# Patient Record
Sex: Male | Born: 1950 | Race: White | Hispanic: No
Health system: Midwestern US, Community
[De-identification: ages and names within clinical notes are randomized; demographics above are authoritative.]

## PROBLEM LIST (undated history)

## (undated) DIAGNOSIS — M5440 Lumbago with sciatica, unspecified side: Secondary | ICD-10-CM

## (undated) DIAGNOSIS — Z8673 Personal history of transient ischemic attack (TIA), and cerebral infarction without residual deficits: Secondary | ICD-10-CM

## (undated) DIAGNOSIS — N3001 Acute cystitis with hematuria: Secondary | ICD-10-CM

## (undated) DIAGNOSIS — K5903 Drug induced constipation: Secondary | ICD-10-CM

## (undated) DIAGNOSIS — I509 Heart failure, unspecified: Secondary | ICD-10-CM

## (undated) DIAGNOSIS — F329 Major depressive disorder, single episode, unspecified: Secondary | ICD-10-CM

## (undated) DIAGNOSIS — M51369 Other intervertebral disc degeneration, lumbar region without mention of lumbar back pain or lower extremity pain: Secondary | ICD-10-CM

## (undated) DIAGNOSIS — G20 Parkinson's disease: Secondary | ICD-10-CM

## (undated) DIAGNOSIS — K859 Acute pancreatitis without necrosis or infection, unspecified: Secondary | ICD-10-CM

## (undated) DIAGNOSIS — I5032 Chronic diastolic (congestive) heart failure: Secondary | ICD-10-CM

## (undated) DIAGNOSIS — F1020 Alcohol dependence, uncomplicated: Secondary | ICD-10-CM

## (undated) DIAGNOSIS — K219 Gastro-esophageal reflux disease without esophagitis: Secondary | ICD-10-CM

## (undated) DIAGNOSIS — R32 Unspecified urinary incontinence: Secondary | ICD-10-CM

## (undated) DIAGNOSIS — I10 Essential (primary) hypertension: Secondary | ICD-10-CM

## (undated) DIAGNOSIS — I639 Cerebral infarction, unspecified: Secondary | ICD-10-CM

## (undated) DIAGNOSIS — F32A Depression, unspecified: Secondary | ICD-10-CM

## (undated) DIAGNOSIS — M5136 Other intervertebral disc degeneration, lumbar region: Secondary | ICD-10-CM

## (undated) DIAGNOSIS — D4989 Neoplasm of unspecified behavior of other specified sites: Secondary | ICD-10-CM

## (undated) DIAGNOSIS — J209 Acute bronchitis, unspecified: Principal | ICD-10-CM

## (undated) DIAGNOSIS — R079 Chest pain, unspecified: Secondary | ICD-10-CM

## (undated) HISTORY — DX: Major depressive disorder, single episode, unspecified: F32.9

## (undated) HISTORY — PX: KNEE ARTHROSCOPY: SUR90

## (undated) HISTORY — DX: Acute pancreatitis without necrosis or infection, unspecified: K85.90

## (undated) HISTORY — DX: Drug induced constipation: K59.03

## (undated) HISTORY — DX: Personal history of transient ischemic attack (TIA), and cerebral infarction without residual deficits: Z86.73

## (undated) HISTORY — DX: Other intervertebral disc degeneration, lumbar region without mention of lumbar back pain or lower extremity pain: M51.369

## (undated) HISTORY — DX: Alcohol dependence, uncomplicated: F10.20

## (undated) HISTORY — PX: OTHER SURGICAL HISTORY: SHX169

## (undated) HISTORY — PX: ESOPHAGOGASTRODUODENOSCOPY: SHX1529

## (undated) HISTORY — DX: Essential (primary) hypertension: I10

## (undated) HISTORY — DX: Chronic diastolic (congestive) heart failure: I50.32

## (undated) HISTORY — PX: CHOLECYSTECTOMY: SHX55

## (undated) HISTORY — DX: Depression, unspecified: F32.A

## (undated) HISTORY — DX: Acute cystitis with hematuria: N30.01

## (undated) HISTORY — DX: Lumbago with sciatica, unspecified side: M54.40

## (undated) HISTORY — DX: Other intervertebral disc degeneration, lumbar region: M51.36

## (undated) HISTORY — PX: COLONOSCOPY: SHX174

## (undated) HISTORY — DX: Unspecified urinary incontinence: R32

## (undated) HISTORY — DX: Gastro-esophageal reflux disease without esophagitis: K21.9

## (undated) HISTORY — PX: APPENDECTOMY: SHX54

## (undated) HISTORY — PX: BACK SURGERY: SHX140

## (undated) MED ORDER — MELOXICAM 15 MG TAB
15 mg | ORAL_TABLET | Freq: Every day | ORAL | Status: DC
Start: ? — End: 2014-01-22

## (undated) MED ORDER — PREDNISONE 20 MG TAB
20 mg | ORAL_TABLET | Freq: Every day | ORAL | Status: AC
Start: ? — End: 2012-05-25

## (undated) MED ORDER — DIAZEPAM 5 MG TAB
5 mg | ORAL_TABLET | Freq: Three times a day (TID) | ORAL | Status: DC | PRN
Start: ? — End: 2014-01-22

## (undated) MED ORDER — OXYCODONE-ACETAMINOPHEN 5 MG-325 MG TAB
5-325 mg | ORAL_TABLET | ORAL | Status: DC | PRN
Start: ? — End: 2014-01-22

## (undated) MED ORDER — OXYCODONE-ACETAMINOPHEN 5 MG-325 MG TAB
5-325 mg | ORAL_TABLET | ORAL | Status: DC | PRN
Start: ? — End: 2013-09-11

## (undated) MED ORDER — PREDNISONE 20 MG TAB
20 mg | ORAL_TABLET | Freq: Three times a day (TID) | ORAL | Status: AC
Start: ? — End: 2013-09-18

---

## 2010-10-15 MED ORDER — OXYCODONE-ACETAMINOPHEN 5 MG-325 MG TAB
5-325 mg | ORAL_TABLET | ORAL | Status: DC | PRN
Start: 2010-10-15 — End: 2011-01-15

## 2010-10-15 MED ORDER — ONDANSETRON 4 MG TAB, RAPID DISSOLVE
4 mg | ORAL | Status: AC
Start: 2010-10-15 — End: 2010-10-15
  Administered 2010-10-15: 18:00:00 via ORAL

## 2010-10-15 MED ORDER — HYDROMORPHONE (PF) 1 MG/ML IJ SOLN
1 mg/mL | INTRAMUSCULAR | Status: AC
Start: 2010-10-15 — End: 2010-10-15
  Administered 2010-10-15: 18:00:00 via INTRAMUSCULAR

## 2010-10-15 MED ORDER — DIAZEPAM 5 MG TAB
5 mg | ORAL | Status: AC
Start: 2010-10-15 — End: 2010-10-15
  Administered 2010-10-15: 18:00:00 via ORAL

## 2010-10-15 MED ORDER — DIAZEPAM 5 MG TAB
5 mg | ORAL_TABLET | Freq: Three times a day (TID) | ORAL | Status: DC | PRN
Start: 2010-10-15 — End: 2011-01-15

## 2010-10-15 NOTE — ED Provider Notes (Signed)
I was personally available for consultation in the emergency department.  I have reviewed the chart and agree with the documentation recorded by the MLP, including the assessment, treatment plan, and disposition.  Otniel Hoe E Reeva Davern, MD

## 2010-10-15 NOTE — ED Provider Notes (Signed)
HPI Comments: Dale Weaver is a 60 y.o. male presenting ambulatory to ED C/O low back pain S/P falling out of a lawn chair x 2 days ago.  Pt denies LOC, head injury, numbness, tingling, and incontinence of bladder or bowel.    PCP:  None     There are no other complaints, changes or physical findings at this time.   Written by Joretta Bachelor. Croak, ED Scribe, as dictated by Gillermina Phy.     The history is provided by the patient.        Past Medical History   Diagnosis Date   ??? Back pain    ??? Hiatal hernia    ??? Heart failure      CHF   ??? CAD (coronary artery disease)      cardiac blockage        Past Surgical History   Procedure Date   ??? Hx orthopaedic      back surgery x3, last June 2010   ??? Cardiac surg procedure unlist      cardiac cath August 2009   ??? Cardiac surg procedure unlist      x 1 stent, cardiologist @ [facility]   ??? Hx appendectomy    ??? Hx cholecystectomy          No family history on file.     History     Social History   ??? Marital Status: Divorced     Spouse Name: N/A     Number of Children: N/A   ??? Years of Education: N/A     Occupational History   ??? Not on file.     Social History Main Topics   ??? Smoking status: Never Smoker    ??? Smokeless tobacco: Not on file   ??? Alcohol Use: No   ??? Drug Use: No   ??? Sexually Active:      Other Topics Concern   ??? Not on file     Social History Narrative   ??? No narrative on file                  ALLERGIES: Sulfa (sulfonamide antibiotics)      Review of Systems   Constitutional: Negative.    HENT: Negative.    Eyes: Negative.    Respiratory: Negative.    Cardiovascular: Negative.    Gastrointestinal: Negative.         No stool incontinence   Genitourinary: Negative.         No urinary incontinence   Musculoskeletal: Positive for back pain.   Skin: Negative.    Neurological: Negative.  Negative for numbness.        No LOC or head injury   All other systems reviewed and are negative.        Filed Vitals:    10/15/10 1217   BP: 129/64   Pulse: 79    Temp: 97.7 ??F (36.5 ??C)   Resp: 18   SpO2: 97%            Physical Exam   Nursing note and vitals reviewed.  Constitutional: He is oriented to person, place, and time. He appears well-developed and well-nourished. No distress.   HENT:   Head: Normocephalic and atraumatic.   Right Ear: Tympanic membrane and external ear normal.   Left Ear: Tympanic membrane and external ear normal.   Nose: Nose normal.   Mouth/Throat: Oropharynx is clear and moist and mucous membranes are normal. No oropharyngeal  exudate.   Eyes: Conjunctivae and EOM are normal. Pupils are equal, round, and reactive to light. Right eye exhibits no discharge. Left eye exhibits no discharge. No scleral icterus.   Neck: Normal range of motion. Neck supple. No JVD present. No tracheal deviation present. No thyromegaly present.   Cardiovascular: Normal rate, regular rhythm, normal heart sounds and intact distal pulses.  Exam reveals no gallop and no friction rub.    No murmur heard.  Pulmonary/Chest: Effort normal and breath sounds normal. No stridor. No respiratory distress. He has no wheezes. He has no rales. He exhibits no tenderness.   Abdominal: Soft. Bowel sounds are normal. He exhibits no distension and no mass. No tenderness. He has no rebound and no guarding.   Musculoskeletal: He exhibits no edema and no tenderness.        +R SLR   Lymphadenopathy:     He has no cervical adenopathy.   Neurological: He is alert and oriented to person, place, and time. He has normal strength and normal reflexes. No cranial nerve deficit or sensory deficit. He exhibits normal muscle tone. Coordination and gait normal.   Skin: Skin is warm and dry. No rash noted. He is not diaphoretic. No erythema. No pallor.        Multiple surgical scars and post-op changes to low back   Psychiatric: He has a normal mood and affect.   Written by Joretta Bachelor. Croak, ED Scribe, as dictated by Gillermina Phy.      MDM    Procedures    12:42 PM  PROGRESS NOTE:    Pt has been re-examined and is feeling better.  Results of imaging have been reviewed and discussed with pt.  Care plan has been outlined and pt understands all current sx, dx, tx, and rx.  There are no new complaints, changes, or physical findings at this time.  All questions have been addressed.  All medications were reviewed with the pt.  Pt was instructed to and agrees to follow up with PCP, as well as return to ED upon further deterioration.  Dale Weaver is ready for discharge.  Written by Joretta Bachelor. Croak, ED Scribe, as dictated by Gillermina Phy.

## 2010-10-15 NOTE — ED Notes (Signed)
Patient discharged by PA.  Patient accompanied home by family members.

## 2011-01-15 MED ORDER — HYDROCODONE-ACETAMINOPHEN 5 MG-500 MG TAB
5-500 mg | ORAL_TABLET | ORAL | Status: AC | PRN
Start: 2011-01-15 — End: 2011-01-22

## 2011-01-15 MED ORDER — CEPHALEXIN 500 MG CAP
500 mg | ORAL_CAPSULE | Freq: Four times a day (QID) | ORAL | Status: AC
Start: 2011-01-15 — End: 2011-01-22

## 2011-01-15 NOTE — ED Provider Notes (Signed)
I have personally seen and evaluated patient. I find the patient's history and physical exam are consistent with the PA's NP documentation. I agree with the care provided, treatments rendered, disposition and follow up plan.

## 2011-01-15 NOTE — ED Notes (Signed)
Patient (s)  given copy of dc instructions and 2 script(s).  Patient (s)  verbalized understanding of instructions and script (s).  Patient given a current medication reconciliation form and verbalized understanding of their medications.   Patient (s) verbalized understanding of the importance of discussing medications with  his or her physician or clinic they will be following up with.  Patient alert and oriented and in no acute distress.  Patient discharged home ambulatory with self.

## 2011-01-15 NOTE — ED Provider Notes (Signed)
HPI Comments: Patient is day #10 s/p laparoscopic hiatal hernia repair at the [facility] hospital.   He presents through ED reporting mild upper abdominal pain, and pain at incision site. He explains that he thinks one of the incision sites opened up, but has since scabbed over.   No report of fevers.   Patient was Rx 3 days worth of Vicodin, which he just recently finished.       The history is provided by the patient.        Past Medical History   Diagnosis Date   ??? Back pain    ??? Hiatal hernia    ??? Heart failure      CHF   ??? CAD (coronary artery disease)      cardiac blockage        Past Surgical History   Procedure Date   ??? Hx orthopaedic      back surgery x3, last June 2010   ??? Cardiac surg procedure unlist      cardiac cath August 2009   ??? Cardiac surg procedure unlist      x 1 stent, cardiologist @ [facility]   ??? Hx appendectomy    ??? Hx cholecystectomy    ??? Hx hernia repair          No family history on file.     History     Social History   ??? Marital Status: Divorced     Spouse Name: N/A     Number of Children: N/A   ??? Years of Education: N/A     Occupational History   ??? Not on file.     Social History Main Topics   ??? Smoking status: Never Smoker    ??? Smokeless tobacco: Not on file   ??? Alcohol Use: No   ??? Drug Use: No   ??? Sexually Active:      Other Topics Concern   ??? Not on file     Social History Narrative   ??? No narrative on file                  ALLERGIES: Seafood; Sulfa(sulfonamide antibiotics); and Zoloft      Review of Systems   Constitutional: Negative for appetite change and unexpected weight change.   HENT: Negative for facial swelling, rhinorrhea, trouble swallowing, neck pain, dental problem and ear discharge.    Eyes: Negative for pain and discharge.   Respiratory: Negative for apnea and stridor.    Cardiovascular: Negative for chest pain, palpitations and leg swelling.   Gastrointestinal: Positive for abdominal pain. Negative for vomiting, diarrhea, blood in stool and abdominal distention.    Genitourinary: Negative for dysuria, hematuria, flank pain and difficulty urinating.   Musculoskeletal: Negative for myalgias, back pain, joint swelling and arthralgias.   Skin: Positive for wound. Negative for color change and rash.   Neurological: Negative for facial asymmetry, speech difficulty, numbness and headaches.   Hematological: Negative for adenopathy.   Psychiatric/Behavioral: Negative for suicidal ideas, hallucinations, behavioral problems, self-injury and agitation.       Filed Vitals:    01/15/11 1502   BP: 152/56   Pulse: 67   Temp: 98.4 ??F (36.9 ??C)   Resp: 18   Height: 5\' 10"  (1.778 m)   Weight: 86.183 kg (190 lb)   SpO2: 99%            Physical Exam   [nursing notereviewed.  Constitutional: He is oriented to person, place, and time. He  appears well-developed and well-nourished.   HENT:   Head: Normocephalic and atraumatic.   Right Ear: External ear normal.   Left Ear: External ear normal.   Mouth/Throat: Oropharynx is clear and moist. No oropharyngeal exudate.   Eyes: Conjunctivae and EOM are normal. Pupils are equal, round, and reactive to light. Right eye exhibits no discharge. Left eye exhibits no discharge. No scleral icterus.   Neck: Normal range of motion. Neck supple. No tracheal deviation present. No thyromegaly present.   Cardiovascular: Normal rate, regular rhythm, normal heart sounds and intact distal pulses.    No murmur heard.  Pulmonary/Chest: Effort normal and breath sounds normal. No respiratory distress. He has no wheezes. He has no rales.   Abdominal: Soft. He exhibits no distension. There is tenderness. There is no rebound and no guarding.       Musculoskeletal: Normal range of motion. He exhibits no edema and no tenderness.   Lymphadenopathy:     He has no cervical adenopathy.   Neurological: He is alert and oriented to person, place, and time. No cranial nerve deficit. Coordination normal.   Skin: Skin is warm. No rash noted. There is erythema.    Psychiatric: He has a normal mood and affect. His behavior is normal. Judgment and thought content normal.        MDM    Procedures

## 2011-04-24 MED ORDER — DICLOFENAC 75 MG TAB, DELAYED RELEASE
75 mg | ORAL_TABLET | Freq: Two times a day (BID) | ORAL | Status: DC
Start: 2011-04-24 — End: 2012-05-15

## 2011-04-24 NOTE — ED Notes (Signed)
Patient (s)  given copy of dc instructions and 1 script(s).  Patient (s)  verbalized understanding of instructions and script (s).  Patient given a current medication reconciliation form and verbalized understanding of their medications.   Patient (s) verbalized understanding of the importance of discussing medications with  his or her physician or clinic they will be following up with.  Patient alert and oriented and in no acute distress.  Patient discharged home ambulatory with self.

## 2011-04-24 NOTE — ED Provider Notes (Signed)
Patient is a 60 y.o. male presenting with knee pain. The history is provided by the patient. No language interpreter was used.   Knee Pain   This is a new (twisted while doing yardwork) problem. The current episode started more than 1 week ago. The problem occurs daily. The problem has not changed since onset.The pain is present in the left knee. The quality of the pain is described as dull. The pain is mild. Pertinent negatives include full range of motion, no stiffness, no tingling and no back pain. The symptoms are aggravated by movement. Treatments tried: Vicodin from a previous rx. The treatment provided significant relief. There has been a history of trauma.        Past Medical History   Diagnosis Date   ??? Back pain    ??? Hiatal hernia    ??? Heart failure      CHF   ??? CAD (coronary artery disease)      cardiac blockage        Past Surgical History   Procedure Date   ??? Hx orthopaedic      back surgery x3, last June 2010   ??? Pr cardiac surg procedure unlist      cardiac cath August 2009   ??? Pr cardiac surg procedure unlist      x 1 stent, cardiologist @ [facility]   ??? Hx appendectomy    ??? Hx cholecystectomy    ??? Hx hernia repair          No family history on file.     History     Social History   ??? Marital Status: Widowed     Spouse Name: N/A     Number of Children: N/A   ??? Years of Education: N/A     Occupational History   ??? Not on file.     Social History Main Topics   ??? Smoking status: Never Smoker    ??? Smokeless tobacco: Not on file   ??? Alcohol Use: No   ??? Drug Use: No   ??? Sexually Active:      Other Topics Concern   ??? Not on file     Social History Narrative   ??? No narrative on file                  ALLERGIES: Seafood; Sulfa(sulfonamide antibiotics); and Zoloft      Review of Systems   Musculoskeletal: Positive for arthralgias (left knee). Negative for back pain and stiffness.   Neurological: Negative for tingling.   All other systems reviewed and are negative.        Filed Vitals:    04/24/11 1244   BP: 144/92    Pulse: 73   Temp: 97.5 ??F (36.4 ??C)   Resp: 18   Height: 6\' 1"  (1.854 m)   Weight: 106.595 kg (235 lb)   SpO2: 96%            Physical Exam   Nursing note and vitals reviewed.  Constitutional: He is oriented to person, place, and time. He appears well-developed and well-nourished.   HENT:   Head: Normocephalic and atraumatic.   Eyes: EOM are normal.   Neck: Normal range of motion. Neck supple.   Cardiovascular: Normal rate, regular rhythm, normal heart sounds and intact distal pulses.    Pulmonary/Chest: Effort normal and breath sounds normal. No respiratory distress.   Abdominal: Soft. Bowel sounds are normal. He exhibits no distension. There is no tenderness.  Musculoskeletal: Normal range of motion. He exhibits tenderness. He exhibits no edema.        Left knee: He exhibits normal range of motion, no swelling, no effusion, no ecchymosis, no deformity, no erythema, normal alignment, no LCL laxity, no bony tenderness, normal meniscus and no MCL laxity. tenderness found. Medial joint line and lateral joint line tenderness noted. No MCL, no LCL and no patellar tendon tenderness noted.        Legs:  Neurological: He is alert and oriented to person, place, and time.        Grossly intact without focal deficits      Skin: Skin is warm, dry and intact. No bruising and no ecchymosis noted. No erythema.   Psychiatric: He has a normal mood and affect. His behavior is normal.        MDM    Procedures    Pt. Advised of A/P and the need to follow-up with their PCP or assigned referral physician

## 2012-05-15 LAB — CBC WITH AUTOMATED DIFF
ABS. BASOPHILS: 0 10*3/uL (ref 0.0–0.1)
ABS. EOSINOPHILS: 0.1 10*3/uL (ref 0.0–0.4)
ABS. LYMPHOCYTES: 1.4 10*3/uL (ref 0.8–3.5)
ABS. MONOCYTES: 0.5 10*3/uL (ref 0.0–1.0)
ABS. NEUTROPHILS: 3.4 10*3/uL (ref 1.8–8.0)
BASOPHILS: 0 % (ref 0–1)
EOSINOPHILS: 1 % (ref 0–7)
HCT: 44.8 % (ref 36.6–50.3)
HGB: 15.8 g/dL (ref 12.1–17.0)
LYMPHOCYTES: 26 % (ref 12–49)
MCH: 34.2 PG — ABNORMAL HIGH (ref 26.0–34.0)
MCHC: 35.3 g/dL (ref 30.0–36.5)
MCV: 97 FL (ref 80.0–99.0)
MONOCYTES: 9 % (ref 5–13)
NEUTROPHILS: 64 % (ref 32–75)
PLATELET: 168 10*3/uL (ref 150–400)
RBC: 4.62 M/uL (ref 4.10–5.70)
RDW: 12.5 % (ref 11.5–14.5)
WBC: 5.2 10*3/uL (ref 4.1–11.1)

## 2012-05-15 LAB — METABOLIC PANEL, COMPREHENSIVE
A-G Ratio: 1.4 (ref 1.1–2.2)
ALT (SGPT): 19 U/L (ref 12–78)
AST (SGOT): 10 U/L — ABNORMAL LOW (ref 15–37)
Albumin: 3.8 g/dL (ref 3.5–5.0)
Alk. phosphatase: 83 U/L (ref 50–136)
Anion gap: 9 mmol/L (ref 5–15)
BUN/Creatinine ratio: 12 (ref 12–20)
BUN: 12 MG/DL (ref 6–20)
Bilirubin, total: 0.8 MG/DL (ref 0.2–1.0)
CO2: 26 MMOL/L (ref 21–32)
Calcium: 8.5 MG/DL (ref 8.5–10.1)
Chloride: 106 MMOL/L (ref 97–108)
Creatinine: 0.98 MG/DL (ref 0.45–1.15)
GFR est AA: >60 mL/min/{1.73_m2} (ref >60)
GFR est non-AA: >60 mL/min/{1.73_m2} (ref >60)
Globulin: 2.7 g/dL (ref 2.0–4.0)
Glucose: 103 MG/DL — ABNORMAL HIGH (ref 65–100)
Potassium: 4.2 MMOL/L (ref 3.5–5.1)
Protein, total: 6.5 g/dL (ref 6.4–8.2)
Sodium: 141 MMOL/L (ref 136–145)

## 2012-05-15 LAB — PROTHROMBIN TIME + INR
INR: 1 (ref 0.9–1.1)
Prothrombin time: 10.4 s (ref 9.4–11.7)

## 2012-05-15 MED ORDER — HYDROMORPHONE (PF) 1 MG/ML IJ SOLN
1 mg/mL | Freq: Once | INTRAMUSCULAR | Status: AC
Start: 2012-05-15 — End: 2012-05-15
  Administered 2012-05-15: 18:00:00 via INTRAVENOUS

## 2012-05-15 MED ORDER — SODIUM CHLORIDE 0.9 % IJ SYRG
INTRAMUSCULAR | Status: DC | PRN
Start: 2012-05-15 — End: 2012-05-15
  Administered 2012-05-15: 18:00:00 via INTRAVENOUS

## 2012-05-15 MED ADMIN — HYDROmorphone (PF) (DILAUDID) injection 2 mg: INTRAVENOUS | @ 19:00:00 | NDC 00409255201

## 2012-05-15 MED ADMIN — methylPREDNISolone (PF) (SOLU-MEDROL) injection 125 mg: INTRAVENOUS | @ 18:00:00 | NDC 00009004725

## 2012-05-15 MED ADMIN — ondansetron (ZOFRAN) injection 4 mg: INTRAVENOUS | @ 18:00:00 | NDC 00409475503

## 2012-05-15 MED ADMIN — acetaminophen (TYLENOL) tablet 1,000 mg: ORAL | @ 15:00:00 | NDC 00904198861

## 2012-05-15 MED FILL — HYDROMORPHONE (PF) 1 MG/ML IJ SOLN: 1 mg/mL | INTRAMUSCULAR | Qty: 2

## 2012-05-15 MED FILL — ONDANSETRON (PF) 4 MG/2 ML INJECTION: 4 mg/2 mL | INTRAMUSCULAR | Qty: 2

## 2012-05-15 MED FILL — ACETAMINOPHEN 500 MG TAB: 500 mg | ORAL | Qty: 2

## 2012-05-15 MED FILL — SOLU-MEDROL (PF) 125 MG/2 ML SOLUTION FOR INJECTION: 125 mg/2 mL | INTRAMUSCULAR | Qty: 2

## 2012-05-15 MED FILL — HYDROMORPHONE (PF) 1 MG/ML IJ SOLN: 1 mg/mL | INTRAMUSCULAR | Qty: 1

## 2012-05-15 NOTE — ED Notes (Signed)
Assumed patient care from triage at this time.

## 2012-05-15 NOTE — ED Provider Notes (Signed)
HPI Comments: 61 y.o. Male, with PMHx significant for chronic back pain, was transferred from [facility] to the ED with C/O back pain secondary to falling. Pt states he was climbing down a ladder when he had a "back spasm" and fell to the ground x 3 days ago. Pt denies LOC. Pt reports back pain, which worsens with movement. Pt currently rates his pain as a 9/10. Pt also reports urinary incontinence and notes he has not had a bowel movement in 2 days. Pt states he was seen at [facility] today for his sx's and had a x-ray of his back showing arthritis changes but no acute fractures. Pt notes he was given Dilaudid for his pain at [facility]. Pt was transferred to [facility] ED so the pt could be seen by Orthopedics. Pt notes he has had 3 back surgeries done at the [facility] hospital. Pt notes he has a Loop Recorder, and s unable to have an MRI performed. Pt denies F/C.     PMHx significant for: back pain, hiatal hernia, CHF, cardiac blockage  PSHx significant for: back surgery x 3, cardiac cath, cardiac stent, appendectomy, cholecystectomy, hernia repair  Social Hx: -tobacco, -EtOH, -illicit drug use    There are no other complaints, changes or physical findings at this time.    Written by Lanier Clam, ED Scribe, as dictated by Garnette Scheuermann, MD.     The history is provided by the patient.        Past Medical History   Diagnosis Date   ??? Back pain    ??? Hiatal hernia    ??? Heart failure      CHF   ??? CAD (coronary artery disease)      cardiac blockage        Past Surgical History   Procedure Date   ??? Hx orthopaedic      back surgery x3, last June 2010   ??? Pr cardiac surg procedure unlist      cardiac cath August 2009   ??? Pr cardiac surg procedure unlist      x 1 stent, [facility]   ??? Hx appendectomy    ??? Hx cholecystectomy    ??? Hx hernia repair          History reviewed. No pertinent family history.     History     Social History   ??? Marital Status: WIDOWED     Spouse  Name: N/A     Number of Children: N/A   ??? Years of Education: N/A     Occupational History   ??? Not on file.     Social History Main Topics   ??? Smoking status: Never Smoker    ??? Smokeless tobacco: Not on file   ??? Alcohol Use: No   ??? Drug Use: No   ??? Sexually Active:      Other Topics Concern   ??? Not on file     Social History Narrative   ??? No narrative on file                  ALLERGIES: Seafood; Sulfa (sulfonamide antibiotics); and Zoloft      Review of Systems   Constitutional: Negative.  Negative for fever and chills.   HENT: Negative.    Eyes: Negative.    Respiratory: Negative.    Cardiovascular: Negative.    Gastrointestinal: Positive for constipation.   Genitourinary: Positive for enuresis.   Musculoskeletal:  Positive for back pain.   Skin: Negative.    Neurological: Negative.         -No LOC   Hematological: Negative.    Psychiatric/Behavioral: Negative.    All other systems reviewed and are negative.        Filed Vitals:    05/15/12 1712 05/15/12 2004 05/15/12 2019 05/15/12 2034   BP:  138/67 128/53 145/83   Pulse:       Temp:       Resp:       SpO2: 94% 93% 93% 94%            Physical Exam   Nursing note and vitals reviewed.  Constitutional: He is oriented to person, place, and time. He appears well-developed and well-nourished. No distress.   HENT:   Head: Normocephalic and atraumatic.   Eyes: EOM are normal. Pupils are equal, round, and reactive to light.   Neck: Normal range of motion. Neck supple.   Cardiovascular: Normal rate, regular rhythm, normal heart sounds and intact distal pulses.  Exam reveals no friction rub.    No murmur heard.  Pulmonary/Chest: Effort normal and breath sounds normal. No respiratory distress. He has no wheezes. He has no rales. He exhibits no tenderness.   Abdominal: Soft. Bowel sounds are normal. He exhibits no distension. There is no tenderness. There is no rebound and no guarding.   Musculoskeletal: Normal range of motion. He exhibits tenderness. He exhibits no edema.         L spine. There is a port wine birthmark over the lower portion of the L spine and buttock cleft. SUrgical scars in the butock cleft. Positive SLR. ABle to sit, stand and ambulate without assistance.   Neurological: He is alert and oriented to person, place, and time. He exhibits normal muscle tone. Coordination normal.   Skin: Skin is warm and dry. He is not diaphoretic. No pallor.   Psychiatric: He has a normal mood and affect. His behavior is normal.        MDM     Differential Diagnosis; Clinical Impression; Plan:     DDx: spinal malignancy, spine fracture, herniated disc, cauda equina  Amount and/or Complexity of Data Reviewed:   Clinical lab tests:  Ordered and reviewed  Tests in the radiology section of CPT??:  Ordered and reviewed   Review and summarize past medical records:  Yes   Discuss the patient with another provider:  Yes (Orthopedics)   Independant visualization of image, tracing, or specimen:  Yes  Progress:   Patient progress:  Stable      Procedures    CONSULT NOTE:   8:24 PM  Garnette Scheuermann, MD spoke with PA-Straub,   Specialty: Orthopedics  Discussed pt's hx, disposition, and available diagnostic and imaging results. Reviewed care plans. Consultant agrees with plans as outlined. PA-Straub states the pt does not have cauda equina after reviewing the pt's CT scan. PA-Straub recommends prescribing the pt steroids and pain medication. PA-Straub states the pt can be discharged and advised having the pt follow up with Dr. Mammie Russian.   Written by Lanier Clam, ED Scribe, as dictated by Garnette Scheuermann, MD.    LABORATORY TESTS:  Recent Results (from the past 12 hour(s))   CBC WITH AUTOMATED DIFF    Collection Time    05/15/12  2:45 PM       Component Value Range    WBC 5.2  4.1 - 11.1 K/uL    RBC 4.62  4.10 - 5.70 M/uL    HGB 15.8  12.1 - 17.0 g/dL    HCT 16.1  09.6 - 04.5 %    MCV 97.0  80.0 - 99.0 FL    MCH 34.2 (*) 26.0 - 34.0 PG    MCHC 35.3  30.0 - 36.5 g/dL    RDW 40.9  81.1 - 91.4 %    PLATELET  168  150 - 400 K/uL    NEUTROPHILS 64  32 - 75 %    LYMPHOCYTES 26  12 - 49 %    MONOCYTES 9  5 - 13 %    EOSINOPHILS 1  0 - 7 %    BASOPHILS 0  0 - 1 %    ABS. NEUTROPHILS 3.4  1.8 - 8.0 K/UL    ABS. LYMPHOCYTES 1.4  0.8 - 3.5 K/UL    ABS. MONOCYTES 0.5  0.0 - 1.0 K/UL    ABS. EOSINOPHILS 0.1  0.0 - 0.4 K/UL    ABS. BASOPHILS 0.0  0.0 - 0.1 K/UL   METABOLIC PANEL, COMPREHENSIVE    Collection Time    05/15/12  2:45 PM       Component Value Range    Sodium 141  136 - 145 MMOL/L    Potassium 4.2  3.5 - 5.1 MMOL/L    Chloride 106  97 - 108 MMOL/L    CO2 26  21 - 32 MMOL/L    Anion gap 9  5 - 15 mmol/L    Glucose 103 (*) 65 - 100 MG/DL    BUN 12  6 - 20 MG/DL    Creatinine 7.82  9.56 - 1.15 MG/DL    BUN/Creatinine ratio 12  12 - 20      GFR est AA >60  >60 ml/min/1.70m2    GFR est non-AA >60  >60 ml/min/1.58m2    Calcium 8.5  8.5 - 10.1 MG/DL    Bilirubin, total 0.8  0.2 - 1.0 MG/DL    ALT 19  12 - 78 U/L    AST 10 (*) 15 - 37 U/L    Alk. phosphatase 83  50 - 136 U/L    Protein, total 6.5  6.4 - 8.2 g/dL    Albumin 3.8  3.5 - 5.0 g/dL    Globulin 2.7  2.0 - 4.0 g/dL    A-G Ratio 1.4  1.1 - 2.2     PROTHROMBIN TIME    Collection Time    05/15/12  2:45 PM       Component Value Range    INR 1.0  0.9 - 1.1      Prothrombin time 10.4  9.4 - 11.7 sec       IMAGING RESULTS:   CT SPINE LUMB WO CONT (Final result)   Result time:05/15/12 1919      Final result by Rad Results In Edi (05/15/12 19:19:00)      Narrative:    **Final Report**      ICD Codes / Adm.Diagnosis: 12 / Back Pain   Examination: CT L SPINE WO CON - [id number] - May 15 2012 7:01PM  Accession No: [id number]  Reason: Trauma      REPORT:  ADDITIONAL INDICATION: Trauma, low back pain.    COMPARISON: None.    TECHNIQUE: Multislice helical CT of the lumbar spine was performed. Sagittal   and coronal reformations were generated.    FINDINGS:  The alignment of the lumbar spine is unremarkable for 2 mm retrolisthesis at  L2-3 and L3-4, with multilevel spondylosis and  degenerative disc disease..   Disc space height is diminished at L5-S1. There is partial fusion at T11-12   with mild disc space narrowing as well. The vertebral body heights and disc   spaces are otherwise well-preserved.    The findings at each level are as follows:    T12-L1: No herniation or stenosis.    L1-L2: No herniation or stenosis.    L2-L3: Grade 1 retrolisthesis with moderately severe central stenosis, broad   spondylosis and bulge and moderately severe facet arthropathy. No discrete   herniation. Mild bilateral foraminal stenosis.    L3-L4: Grade 1 retrolisthesis with moderately severe central stenosis, broad   spondylosis and bulge, moderate facet arthropathy and moderate foraminal   stenosis.4 prior laminectomy is suggested.    L4-L5: Mild central stenosis, moderate facet arthropathy, significant   foraminal stenosis. There may have been prior left laminectomy.1    L5-S1: Mild central stenosis and bilateral foraminal stenosis.      IMPRESSION:  1. No acute bony abnormality of the lumbar spine.  2. Multilevel degenerative disc disease and stenosis with foraminal stenosis.  3. No discrete herniation.  4. Probable postoperative change.          Signing/Reading Doctor: Creed Copper [phone])   Approved: Creed Copper [phone]) May 15 2012 7:17PM          MEDICATIONS GIVEN:    Medications   predniSONE (DELTASONE) 20 mg tablet (not administered)   oxyCODONE-acetaminophen (PERCOCET) 5-325 mg per tablet (not administered)   HYDROmorphone (PF) (DILAUDID) injection 2 mg (2 mg SubCUTAneous Given 05/15/12 2039)       IMPRESSION:  1. Low back strain    2. Fall    3. Degenerative lumbar disc        PLAN:  1. Pt prescribed Deltasone and Percocet   2. F/U with Orthopedics   Return to ED if worse       Discharge Note:  8:30 PM  Pt has been reexamined by Garnette Scheuermann, MD. Pt has no new complaints, changes, or physical findings. Care plan outlined and precautions discussed. All available results were  reviewed with pt. All medications were reviewed with pt. All of pt???s questions and concerns were addressed. Pt agrees to F/U as instructed by Garnette Scheuermann, MD and agrees to return to ED upon further deterioration. Pt is ready to go home.  Written by Lanier Clam, ED Scribe, as dictated by Garnette Scheuermann, MD.

## 2012-05-15 NOTE — ED Notes (Signed)
Patient transported to x-ray at this time.

## 2012-05-15 NOTE — ED Notes (Signed)
NP Marylene Land at bedside updating patient on plan of care and admission. Tech obtaining IV and blood specimens. Will continue to monitor.

## 2012-05-15 NOTE — ED Notes (Signed)
Patient reports he fell off ladder standing 3 rungs up on ladder onto lower back. Patient seen at [facility] ED today for lower back pain. Patient also C/O being incontinent of urine and having no BM x 3 days since accident. [facility] advised they saw changes on x ray and sent to [facility] ED for ortho consult

## 2012-05-15 NOTE — ED Notes (Signed)
Transport at bedside. EMTALA form completed by RN Jean Rosenthal. Transport has EMTALA form and patients face sheet / ED encounter in hand. Patients care transferred to EMS at this time.

## 2012-05-15 NOTE — ED Notes (Signed)
Dr. Marinus Maw has reviewed discharge instructions with the patient.  The patient verbalized understanding.  Pt was medicated per order, pt to wait in room until ride arrives at 2115.

## 2012-05-15 NOTE — Progress Notes (Signed)
Pt says that he has a PCP at the [place], but he has also applied for [facility]. No further issues.    Selinda Michaels, MSW, [phone]

## 2012-05-15 NOTE — Other (Signed)
TRANSFER - OUT REPORT:    Verbal report given to Hilton Cork RN (name) on Dale Weaver  being transferred to [facility] ED (unit) for routine progression of care       Report consisted of patient???s Situation, Background, Assessment and   Recommendations(SBAR).     Information from the following report(s) SBAR was reviewed with the receiving nurse.    Opportunity for questions and clarification was provided.

## 2012-05-15 NOTE — ED Notes (Signed)
Patient updated on plan of care. Patient awaiting discharge. Will continue to monitor.

## 2012-05-15 NOTE — ED Provider Notes (Signed)
HPI Comments: Dale Weaver is a 61 year old male present to ER today complainig of back pain . Says back pain is chronic . Says he has been treated at the  [facility] and has had back surgeries . Pain is worsening. And he has become incontinent  Urine. He says "the last time this happened I had surgery"   He denies fever chills He denies chest pain abdominal pain. He reports numbness in his legs and says "if I pull a hair off my leg I do not feel anything. He reports parasthesia bilaterally.  PCP Dr Harrison Mons    Patient is a 61 y.o. male presenting with back pain. The history is provided by the patient. No language interpreter was used.   Back Pain   This is a chronic problem. The current episode started more than 1 week ago. The problem has been gradually worsening. The problem occurs constantly. The pain is associated with no known injury. The pain is present in the lumbar spine. The quality of the pain is described as similar to previous episodes. Radiates to: both legs. The pain is severe. The symptoms are aggravated by certain positions. Associated symptoms include bladder incontinence and paresthesias. Pertinent negatives include no chest pain, no fever, no numbness, no headaches, no abdominal pain, no dysuria and no weakness. He has tried muscle relaxants and analgesics for the symptoms. Risk factors: previous back surgery.        Past Medical History   Diagnosis Date   ??? Back pain    ??? Hiatal hernia    ??? Heart failure      CHF   ??? CAD (coronary artery disease)      cardiac blockage        Past Surgical History   Procedure Date   ??? Hx orthopaedic      back surgery x3, last June 2010   ??? Pr cardiac surg procedure unlist      cardiac cath August 2009   ??? Pr cardiac surg procedure unlist      x 1 stent, [facility] @ [facility]   ??? Hx appendectomy    ??? Hx cholecystectomy    ??? Hx hernia repair          History reviewed. No pertinent family history.     History     Social History   ??? Marital Status: WIDOWED     Spouse Name: N/A      Number of Children: N/A   ??? Years of Education: N/A     Occupational History   ??? Not on file.     Social History Main Topics   ??? Smoking status: Never Smoker    ??? Smokeless tobacco: Not on file   ??? Alcohol Use: No   ??? Drug Use: No   ??? Sexually Active:      Other Topics Concern   ??? Not on file     Social History Narrative   ??? No narrative on file                  ALLERGIES: Seafood; Sulfa (sulfonamide antibiotics); and Zoloft      Review of Systems   Constitutional: Negative for fever, chills and fatigue.   HENT: Negative for congestion, sore throat, neck pain and neck stiffness.    Eyes: Negative for redness.   Respiratory: Negative for cough, chest tightness and wheezing.    Cardiovascular: Negative for chest pain.   Gastrointestinal: Negative for abdominal pain.  Genitourinary: Positive for bladder incontinence. Negative for dysuria.   Musculoskeletal: Positive for back pain. Negative for myalgias and arthralgias.   Skin: Negative for rash.   Neurological: Positive for paresthesias. Negative for dizziness, syncope, weakness, light-headedness, numbness and headaches.   Hematological: Negative for adenopathy.   Psychiatric/Behavioral: Negative for behavioral problems and agitation.   All other systems reviewed and are negative.        Filed Vitals:    05/15/12 1441 05/15/12 1449 05/15/12 1500 05/15/12 1613   BP: 141/80  147/83 130/106   Pulse: 60   54   Temp:       Resp:    16   Height:       Weight:       SpO2: 100% 96% 96% 95%            Physical Exam   Nursing note and vitals reviewed.  Constitutional: He is oriented to person, place, and time. He appears well-developed and well-nourished.   HENT:   Head: Normocephalic and atraumatic.   Right Ear: External ear normal.   Mouth/Throat: Oropharynx is clear and moist.   Eyes: Conjunctivae are normal. Right eye exhibits no discharge. Left eye exhibits no discharge.   Neck: Normal range of motion. Neck supple.   Cardiovascular: Normal rate, regular rhythm and  normal heart sounds.    Pulmonary/Chest: Effort normal and breath sounds normal. No respiratory distress. He has no wheezes.   Abdominal: Soft. Bowel sounds are normal. There is no tenderness.   Musculoskeletal: Normal range of motion.        Lumbar back: He exhibits tenderness, bony tenderness and pain.        Pain on slight elevation of LEs pain on movement position changes     Lymphadenopathy:     He has no cervical adenopathy.   Neurological: He is alert and oriented to person, place, and time. No cranial nerve deficit.   Skin: Skin is warm and dry.   Psychiatric: He has a normal mood and affect. His behavior is normal. Judgment and thought content normal.    DDX  Chronic back pain worsening spondylosis now withneurogenic bladder    MDM     Amount and/or Complexity of Data Reviewed:   Clinical lab tests:  Ordered and reviewed  Tests in the radiology section of CPT??:  Ordered and reviewed   Discuss the patient with another provider:  Yes (Drf [place])    Chief Complaint   Patient presents with   ??? Back Pain     history of chronic pain, states difficulty with urinating and BM           MEDICATIONS GIVEN:    Medications   sodium chloride (NS) flush 5-10 mL (not administered)   sodium chloride (NS) flush 5-10 mL (10 mL IntraVENous Given 05/15/12 1428)   acetaminophen (TYLENOL) tablet 1,000 mg (1000 mg Oral Given 05/15/12 1104)   methylPREDNISolone (PF) (SOLU-MEDROL) injection 125 mg (125 mg IntraVENous Given 05/15/12 1428)   HYDROmorphone (PF) (DILAUDID) injection 1 mg (1 mg IntraVENous Given 05/15/12 1428)   ondansetron (ZOFRAN) injection 4 mg (4 mg IntraVENous Given 05/15/12 1428)   HYDROmorphone (PF) (DILAUDID) injection 2 mg (2 mg IntraVENous Given 05/15/12 1501)       LABS REVIEWED:  Labs Reviewed   CBC WITH AUTOMATED DIFF - Abnormal; Notable for the following:     MCH 34.2 (*)      All other components within normal limits   METABOLIC PANEL,  COMPREHENSIVE - Abnormal; Notable for the following:     Glucose 103 (*)       AST 10 (*)      All other components within normal limits   PROTHROMBIN TIME       RADIOLOGY RESULTS:  The following have been ordered and reviewed:  _____________________________________________________________________    _____________________________________________________________________      PROCEDURES:        CONSULTATIONS:       PROGRESS NOTES:  Pain relief obtained with dilaudid  Discussed need for admit  Patient says he does not want admit to the [place].    IMPRESSION:  1- cauda equina syndrom  PLAN:  1-transfer to [facility]            Procedures  1540  Dr Lenox Ponds has discussed findings with  Dr Ermalinda Memos [facility] patient will be transferred

## 2012-05-15 NOTE — ED Notes (Signed)
Assumed care of pt at this time.  Pt resting in bed, pt noted to have weakness in lower extremities upon exam, pt requests pain meds and meal tray, Dr. Marinus Maw made aware of this.  Call bell in reach, pt given urinal for urine sample.

## 2012-05-15 NOTE — ED Notes (Signed)
Patient medicated per MD order. Patient updated on plan of care. Will continue to monitor.

## 2012-05-15 NOTE — ED Notes (Signed)
Patient O2 sats are 89-91% on RA. Patient placed on 2L/NC O2 and O2 raised to 94-95%. ED MD Marinus Maw advised

## 2012-05-15 NOTE — ED Provider Notes (Signed)
I have personally seen and evaluated patient. I find the patient's history and physical exam are consistent with the PA's NP documentation. I agree with the care provided, treatments rendered, disposition and follow up plan.  I have personally seen and evaluated patient. I find the patient's history and physical exam are consistent with the PA's NP documentation. I agree with the care provided, treatments rendered, disposition and follow up plan.

## 2012-05-15 NOTE — ED Notes (Signed)
Case Management at bedside. Will continue to monitor.

## 2012-05-15 NOTE — ED Notes (Signed)
Patient complains of server back pain times three days. Patient states three days ago he was on a ladder cleaning his gutters and tripped of the bottoms steps. Patient denied any LOC. Patient states no BM in two days and he states he does not have the urge to have a BM. Patient states he not tired anything to promote a BM. Patient states he cannot hold his urine, patients states when has the urge to urinate he has to use the restroom at that time. Patient denies painful or frequency in urination.  Patient denies any fever/chills, chest pain, nausea, shortness of breath, visual disturbances or dizziness. Assessment completed. Patient alert and oriented times four, skin warm and dry, lung sounds clear bilaterally, respirations even and unlabored. Patient placed in position of comfort. Call bell with in reach. Patient updated on plan of care. Will continue to monitor.

## 2012-05-15 NOTE — ED Notes (Signed)
Patient placed on monitor times two. Patient medicated per MD order. Will continue to monitor.

## 2012-05-15 NOTE — ED Notes (Signed)
Pt assisted to car via wheelchair, pt driven home by Gene Wilson.

## 2012-05-16 LAB — URINALYSIS W/MICROSCOPIC
Bacteria: NEGATIVE /HPF
Bilirubin: NEGATIVE
Blood: NEGATIVE
Glucose: NEGATIVE MG/DL
Ketone: NEGATIVE MG/DL
Leukocyte Esterase: NEGATIVE
Nitrites: NEGATIVE
Protein: NEGATIVE MG/DL
Specific gravity: 1.023 (ref 1.003–1.030)
Urobilinogen: 0.2 EU/DL (ref 0.2–1.0)
pH (UA): 5 (ref 5.0–8.0)

## 2012-05-16 MED ADMIN — HYDROmorphone (PF) (DILAUDID) injection 2 mg: SUBCUTANEOUS | @ 01:00:00 | NDC 00409128331

## 2012-05-16 MED FILL — HYDROMORPHONE (PF) 1 MG/ML IJ SOLN: 1 mg/mL | INTRAMUSCULAR | Qty: 2

## 2013-08-20 DIAGNOSIS — G20 Parkinson's disease: Secondary | ICD-10-CM

## 2013-08-20 HISTORY — DX: Parkinson's disease: G20

## 2013-09-11 MED ORDER — OXYCODONE-ACETAMINOPHEN 5 MG-325 MG TAB
5-325 mg | ORAL | Status: AC
Start: 2013-09-11 — End: 2013-09-11
  Administered 2013-09-11: 18:00:00 via ORAL

## 2013-09-11 MED ORDER — KETOROLAC TROMETHAMINE 30 MG/ML INJECTION
30 mg/mL (1 mL) | INTRAMUSCULAR | Status: AC
Start: 2013-09-11 — End: 2013-09-11
  Administered 2013-09-11: 18:00:00 via INTRAMUSCULAR

## 2013-09-11 MED FILL — KETOROLAC TROMETHAMINE 30 MG/ML INJECTION: 30 mg/mL (1 mL) | INTRAMUSCULAR | Qty: 1

## 2013-09-11 MED FILL — OXYCODONE-ACETAMINOPHEN 5 MG-325 MG TAB: 5-325 mg | ORAL | Qty: 1

## 2013-09-11 NOTE — ED Notes (Signed)
Patient (s) was given copy of dc instructions and  script(s).  Patient (s)  verbalized understanding of instructions and script (s).  Patient given a current medication reconciliation form and verbalized understanding of their medications.   Patient (s) verbalized understanding of the importance of discussing medications with  his or her physician or clinic they will be following up with.  Patient alert and oriented and in no acute distress.  Patient discharged home ambulatory with steady independent gait with cane. Driver verified prior to discharge. Marland Kitchen

## 2013-09-11 NOTE — ED Provider Notes (Signed)
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 Says that in 2013, had a fall off of a ladder and re-injured his lumbar back. Was seen here and transported to [facility], where he was seen by Orthopedics. Says that was after having previous lumbar laminectomy. He was found to have mild retrolisthesis of L1 over L2 at that time. Has not had f/u with Neurosurgery or OrthoSpine since then. Yesterday, was helping do some housework and yard-work when he bent over to pick something up. As he was bending over, felt a distinct pulling sensation, "almost like a pop, where I could feel something pull loose inside of my lower back." Reports onset of excruciating lumbar back pain as he stood upright. Started having bilateral paraspinal muscle spasms thereafter. Says that since event, has noticed that he has had some decreased sensation at the level of the groin, can pull hair out of the pubic region without sensation. Reports that he is having difficulty having a bowel movement, whereby it seems that when he sits down to have a BM, stool will spontaneously fall out of him all at once. He has not had defined fecal incontinence however and is not experiencing tingling or other noted numbness in the groin. Has been having some difficulty starting a stream on voiding and reports weakness in his legs. Says that he gets weakness in both legs after about 10 steps with the walker. Denies other radiculopathy or deficits, other acute injury, illness, complaints.    Patient is a 63 y.o. male presenting with back pain. The history is provided by the patient.   Back Pain   This is a new problem. The current episode started yesterday. The problem has been gradually worsening. The problem occurs constantly. Patient reports not work related injury.The pain is associated with lifting. The pain is present  in the lumbar spine, lower back, left side and right side. The quality of the pain is described as aching and similar to previous episodes. The pain does not radiate. The pain is at a severity of 10/10. The pain is severe. The symptoms are aggravated by bending, twisting and certain positions. The pain is the same all the time. Associated symptoms include numbness and weakness. Pertinent negatives include no chest pain, no fever, no weight loss, no headaches, no abdominal pain, no abdominal swelling, no bowel incontinence, no perianal numbness, no bladder incontinence, no dysuria, no pelvic pain, no leg pain, no paresthesias, no paresis and no tingling. He has tried NSAIDs for the symptoms. The treatment provided no relief. Risk factors include lack of exercise. The patient's surgical history includes laminectomy.       Past Medical History   Diagnosis Date   ??? Back pain    ??? Hiatal hernia    ??? Heart failure      CHF   ??? CAD (coronary artery disease)      cardiac blockage        Past Surgical History   Procedure Laterality Date   ??? Hx orthopaedic       back surgery x3, last June 2010   ??? Pr cardiac surg procedure unlist       cardiac cath August 2009   ??? Pr cardiac surg procedure unlist       x 1 [facility]   ??? Hx appendectomy     ??? Hx cholecystectomy     ???  Hx hernia repair           History reviewed. No pertinent family history.     History     Social History   ??? Marital Status: WIDOWED     Spouse Name: N/A     Number of Children: N/A   ??? Years of Education: N/A     Occupational History   ??? Not on file.     Social History Main Topics   ??? Smoking status: Never Smoker    ??? Smokeless tobacco: Not on file   ??? Alcohol Use: No   ??? Drug Use: No   ??? Sexually Active:      Other Topics Concern   ??? Not on file     Social History Narrative   ??? No narrative on file                  ALLERGIES: Seafood; Sulfa (sulfonamide antibiotics); and Zoloft      Review of Systems   Constitutional: Negative for fever, chills,  weight loss, activity change, appetite change and fatigue.   HENT: Negative for hearing loss, ear pain, nosebleeds, congestion, sore throat, facial swelling, rhinorrhea, sneezing, mouth sores, trouble swallowing, neck pain, neck stiffness, voice change, postnasal drip and sinus pressure.    Eyes: Negative.    Respiratory: Negative for cough, choking, chest tightness, shortness of breath, wheezing and stridor.    Cardiovascular: Negative for chest pain, palpitations and leg swelling.   Gastrointestinal: Negative for nausea, vomiting, abdominal pain, diarrhea, constipation, blood in stool, rectal pain and bowel incontinence.   Genitourinary: Positive for difficulty urinating. Negative for bladder incontinence, dysuria, urgency, frequency, hematuria, flank pain, decreased urine volume, discharge, penile swelling, scrotal swelling, genital sores, penile pain, testicular pain and pelvic pain.   Musculoskeletal: Positive for myalgias, back pain and gait problem. Negative for joint swelling and arthralgias.   Skin: Negative for color change, pallor, rash and wound.   Neurological: Positive for tremors, weakness and numbness. Negative for dizziness, tingling, seizures, syncope, facial asymmetry, speech difficulty, light-headedness, headaches and paresthesias.   Hematological: Negative for adenopathy. Does not bruise/bleed easily.   Psychiatric/Behavioral: Negative.        Filed Vitals:    09/11/13 1132   BP: 122/87   Pulse: 83   Temp: 98.2 ??F (36.8 ??C)   Resp: 18   Height: 6\' 1"  (1.854 m)   Weight: 106.595 kg (235 lb)   SpO2: 96%            Physical Exam   Nursing note and vitals reviewed.  Constitutional: He is oriented to person, place, and time. He appears well-developed and well-nourished. He appears distressed.   Obvious Pain   HENT:   Head: Normocephalic and atraumatic.   Right Ear: External ear normal.   Left Ear: External ear normal.   Nose: Nose normal.   Mouth/Throat: Oropharynx is clear and moist. No  oropharyngeal exudate.   Eyes: Conjunctivae are normal. Pupils are equal, round, and reactive to light. Right eye exhibits no discharge. Left eye exhibits no discharge. No scleral icterus.   Neck: Normal range of motion. Neck supple. No JVD present. No tracheal deviation present.   Cardiovascular: Normal rate, regular rhythm, normal heart sounds and intact distal pulses.  Exam reveals no gallop and no friction rub.    No murmur heard.  Pulmonary/Chest: Effort normal and breath sounds normal. No stridor. No respiratory distress. He has no wheezes. He has no rales. He exhibits no tenderness.  Abdominal: Soft. There is no tenderness. There is no rebound and no guarding.   Musculoskeletal: He exhibits tenderness. He exhibits no edema.   TTP of bilateral lumbar paraspinal muscles with palpable spasm and induration noted. Surgica scar in the lumbar region with mid-line tenderness, without palpable step-off noted. No marked deformity, crepitus, swelling, or other palpable trauma. Straight leg raise positive bilaterally at 20 degrees. Rectal tone intact. Motor, sensory grossly intact with questionable decreased strength. Some difficulty in assessing baseline strength due to Parkinsonian Tremor by history. Pt is weight bearing and ambulatory with shuffling, antalgic gait, using cane assist.   Lymphadenopathy:     He has no cervical adenopathy.   Neurological: He is alert and oriented to person, place, and time. He has normal reflexes. He displays normal reflexes. No cranial nerve deficit. He exhibits normal muscle tone. Coordination normal.   Skin: Skin is warm and dry. No rash noted. He is not diaphoretic. No erythema. No pallor.   Psychiatric: He has a normal mood and affect. His behavior is normal. Judgment and thought content normal.        MDM     Differential Diagnosis; Clinical Impression; Plan:     DDX: Cauda Equina vs Retrolisthesis vs Lumbar Paraspinal Spasm  Amount and/or Complexity of Data Reviewed:   Tests in  the radiology section of CPT??:  Ordered and reviewed   Discuss the patient with another provider:  Yes      Procedures    Discussed exam and radiographic findings with patient and with Attending. Pt was advised that he is in need of seeing Neurosurgery or Orthospine for surgery. Patient declines transportation for acute evaluation by Specialist today. Attending spoke with patient and recommended transportation as well. Pt declined further treatment or transport by ambulance to [facility] or [place]. However, patient promises to call 9-1-1 for ambulance transportation if any change or worsening of condition and says that [facility] will call [facility] today for f[facility]u appointment as advised. Pt was given steroids, Pain medicine, and muscle relaxants with precautions for f/u.

## 2013-09-11 NOTE — ED Provider Notes (Signed)
I was personally available for consultation in the emergency department.  I have reviewed the chart and agree with the documentation recorded by the MLP, including the assessment, treatment plan, and disposition.  Alajia Schmelzer S Irlanda Croghan, MD

## 2013-09-11 NOTE — ED Notes (Signed)
Patient (s) was given copy of dc instructions and 3 script(s).  Patient (s)  verbalized understanding of instructions and script (s).  Patient given a current medication reconciliation form and verbalized understanding of their medications.   Patient (s) verbalized understanding of the importance of discussing medications with  his or her physician or clinic they will be following up with.  Patient alert and oriented and in no acute distress.  Patient discharged home ambulatory with steady independent gait. Patient instructed not to drive, operate equipment or work near [place] while taking Norco or Valium as these medications may cause drowsiness.Marland Kitchen

## 2014-01-20 ENCOUNTER — Inpatient Hospital Stay
Admit: 2014-01-20 | Discharge: 2014-01-22 | Disposition: A | Discharge status: Home / Self Care | Attending: Internal Medicine | Admitting: Internal Medicine

## 2014-01-20 DIAGNOSIS — I6350 Cerebral infarction due to unspecified occlusion or stenosis of unspecified cerebral artery: Secondary | ICD-10-CM

## 2014-01-20 LAB — CK W/ CKMB & INDEX
CK - MB: 1.4 NG/ML (ref 0.5–3.6)
CK-MB Index: 1.9 (ref 0.0–2.5)
CK: 73 U/L (ref 39–308)

## 2014-01-20 LAB — METABOLIC PANEL, BASIC
Anion gap: 8 mmol/L (ref 5–15)
BUN/Creatinine ratio: 10 — ABNORMAL LOW (ref 12–20)
BUN: 9 MG/DL (ref 6–20)
CO2: 28 mmol/L (ref 21–32)
Calcium: 8.5 MG/DL (ref 8.5–10.1)
Chloride: 106 mmol/L (ref 97–108)
Creatinine: 0.87 MG/DL (ref 0.45–1.15)
GFR est AA: >60 mL/min/{1.73_m2} (ref >60)
GFR est non-AA: >60 mL/min/{1.73_m2} (ref >60)
Glucose: 102 mg/dL — ABNORMAL HIGH (ref 65–100)
Potassium: 3.9 mmol/L (ref 3.5–5.1)
Sodium: 142 mmol/L (ref 136–145)

## 2014-01-20 LAB — TROPONIN I: Troponin-I, Qt.: <0.04 ng/mL (ref <0.05)

## 2014-01-20 LAB — PTT: aPTT: 25.9 s (ref 22.1–32.5)

## 2014-01-20 LAB — CBC WITH AUTOMATED DIFF
ABS. BASOPHILS: 0 10*3/uL (ref 0.0–0.1)
ABS. EOSINOPHILS: 0.1 10*3/uL (ref 0.0–0.4)
ABS. LYMPHOCYTES: 1.1 10*3/uL (ref 0.8–3.5)
ABS. MONOCYTES: 0.4 10*3/uL (ref 0.0–1.0)
ABS. NEUTROPHILS: 3.1 10*3/uL (ref 1.8–8.0)
BASOPHILS: 0 % (ref 0–1)
EOSINOPHILS: 1 % (ref 0–7)
HCT: 44.2 % (ref 36.6–50.3)
HGB: 15.9 g/dL (ref 12.1–17.0)
LYMPHOCYTES: 24 % (ref 12–49)
MCH: 34.3 PG — ABNORMAL HIGH (ref 26.0–34.0)
MCHC: 36 g/dL (ref 30.0–36.5)
MCV: 95.5 FL (ref 80.0–99.0)
MONOCYTES: 8 % (ref 5–13)
NEUTROPHILS: 67 % (ref 32–75)
PLATELET: 140 10*3/uL — ABNORMAL LOW (ref 150–400)
RBC: 4.63 M/uL (ref 4.10–5.70)
RDW: 11.8 % (ref 11.5–14.5)
WBC: 4.6 10*3/uL (ref 4.1–11.1)

## 2014-01-20 LAB — GLUCOSE, POC: Glucose (POC): 108 mg/dL — ABNORMAL HIGH (ref 65–100)

## 2014-01-20 LAB — PROTHROMBIN TIME + INR
INR: 1 (ref 0.9–1.1)
Prothrombin time: 9.9 s (ref 9.0–11.1)

## 2014-01-20 LAB — POC INR: INR (POC): 1 (ref <1.2)

## 2014-01-20 MED ORDER — ONDANSETRON (PF) 4 MG/2 ML INJECTION
4 mg/2 mL | INTRAMUSCULAR | Status: AC
Start: 2014-01-20 — End: 2014-01-20
  Administered 2014-01-20: 19:00:00 via INTRAVENOUS

## 2014-01-20 MED ORDER — ASPIRIN 300 MG RECTAL SUPPOSITORY
300 mg | Freq: Once | RECTAL | Status: AC
Start: 2014-01-20 — End: 2014-01-20
  Administered 2014-01-20: 18:00:00 via RECTAL

## 2014-01-20 MED ORDER — SODIUM CHLORIDE 0.9 % IJ SYRG
Freq: Three times a day (TID) | INTRAMUSCULAR | Status: DC
Start: 2014-01-20 — End: 2014-01-22
  Administered 2014-01-20 – 2014-01-22 (×6): via INTRAVENOUS

## 2014-01-20 MED ORDER — SODIUM CHLORIDE 0.9 % IJ SYRG
INTRAMUSCULAR | Status: DC | PRN
Start: 2014-01-20 — End: 2014-01-22

## 2014-01-20 MED ORDER — GADOBUTROL 10 MMOL/10 ML (1 MMOL/ML) IV
10 mmol/ mL (1 mmol/mL) | Freq: Once | INTRAVENOUS | Status: AC
Start: 2014-01-20 — End: 2014-01-21
  Administered 2014-01-20: 19:00:00 via INTRAVENOUS

## 2014-01-20 MED ADMIN — HYDROcodone-acetaminophen (NORCO) 7.5-325 mg per tablet 1 Tab: ORAL | @ 22:00:00 | NDC 68084060111

## 2014-01-20 MED ADMIN — 0.9% sodium chloride infusion 30 mL: INTRAVENOUS | @ 19:00:00 | NDC 87701099893

## 2014-01-20 MED ADMIN — clopidogrel (PLAVIX) tablet 75 mg: ORAL | @ 22:00:00 | NDC 00904629461

## 2014-01-20 MED ADMIN — enoxaparin (LOVENOX) injection 40 mg: SUBCUTANEOUS | @ 22:00:00 | NDC 00075062040

## 2014-01-20 MED ADMIN — sodium chloride (NS) flush 5-10 mL: INTRAVENOUS | @ 22:00:00 | NDC 87701099893

## 2014-01-20 MED ADMIN — ketorolac (TORADOL) injection 15 mg: INTRAVENOUS | @ 23:00:00 | NDC 63323016201

## 2014-01-20 MED FILL — ONDANSETRON (PF) 4 MG/2 ML INJECTION: 4 mg/2 mL | INTRAMUSCULAR | Qty: 2

## 2014-01-20 MED FILL — GADAVIST 10 MMOL/10 ML (1 MMOL/ML) INTRAVENOUS SOLUTION: 10 mmol/ mL (1 mmol/mL) | INTRAVENOUS | Qty: 10

## 2014-01-20 MED FILL — HYDROCODONE-ACETAMINOPHEN 7.5 MG-325 MG TAB: ORAL | Qty: 1

## 2014-01-20 MED FILL — ASPIRIN 300 MG RECTAL SUPPOSITORY: 300 mg | RECTAL | Qty: 1

## 2014-01-20 MED FILL — SOLU-MEDROL 1,000 MG INTRAVENOUS SOLUTION: 1000 mg | INTRAVENOUS | Qty: 1000

## 2014-01-20 MED FILL — KETOROLAC TROMETHAMINE 30 MG/ML INJECTION: 30 mg/mL (1 mL) | INTRAMUSCULAR | Qty: 1

## 2014-01-20 MED FILL — LOVENOX 40 MG/0.4 ML SUBCUTANEOUS SYRINGE: 40 mg/0.4 mL | SUBCUTANEOUS | Qty: 0.4

## 2014-01-20 MED FILL — CLOPIDOGREL 75 MG TAB: 75 mg | ORAL | Qty: 1

## 2014-01-20 MED FILL — SOLU-MEDROL (PF) 40 MG/ML SOLUTION FOR INJECTION: 40 mg/mL | INTRAMUSCULAR | Qty: 25

## 2014-01-20 MED FILL — SODIUM CHLORIDE 0.9 % IV: INTRAVENOUS | Qty: 50

## 2014-01-20 NOTE — ED Provider Notes (Signed)
HPI Comments: Dale Weaver is a 63 yo M with pmhx significant for parkinson's disease and CVA presenting to ED via EMS with c/o progressively worsening R sided weakness x 0900 today, with sx's worsening at 1000. Pt notes blurry vision in R eye, change in sensation around R mouth, and headache. Pt also notes current sx's are similar to his previous stroke. Pt denies any deficits from prior strokes. Pt reports he fell 2 weeks ago and struck his head. Pt states he lives with a friend. He specifically denies any fevers, chills, nausea, vomiting, chest pain, shortness of breath, rash, diarrhea, sweating or weight loss.    PCP: None  PMhx significant for back pain, CHF, CAD, hiatal hernia, Parkinson's, CVA  PShx significant for cardiac cath, appendectomy, cholecystectomy, hernia repair  Social hx: -tobacco, -EtOH, -drugs     There were no other complaints, changes, physical findings at this time.   Written by Alcide Evener, ED Scribe, as dictated by Theora Gianotti Burt Ek, MD    The history is provided by the patient.        Past Medical History   Diagnosis Date   ??? Back pain    ??? Hiatal hernia    ??? Heart failure (HCC)      CHF   ??? CAD (coronary artery disease)      cardiac blockage        Past Surgical History   Procedure Laterality Date   ??? Hx orthopaedic       back surgery x3, last June 2010   ??? Pr cardiac surg procedure unlist       cardiac cath August 2009   ??? Pr cardiac surg procedure unlist       x 1 stent, [facility]ologist [facility]   ??? Hx appendectomy     ??? Hx cholecystectomy     ??? Hx hernia repair           No family history on file.     History     Social History   ??? Marital Status: WIDOWED     Spouse Name: N/A     Number of Children: N/A   ??? Years of Education: N/A     Occupational History   ??? Not on file.     Social History Main Topics   ??? Smoking status: Never Smoker    ??? Smokeless tobacco: Not on file   ??? Alcohol Use: No   ??? Drug Use: No   ??? Sexual Activity: Not on file     Other Topics Concern   ??? Not on  file     Social History Narrative   ??? No narrative on file                  ALLERGIES: Seafood; Sulfa (sulfonamide antibiotics); and Zoloft      Review of Systems   Constitutional: Negative for fever, chills, diaphoresis and unexpected weight change.   Eyes: Positive for visual disturbance.   Respiratory: Negative.  Negative for shortness of breath.    Cardiovascular: Negative.  Negative for chest pain.   Gastrointestinal: Negative.  Negative for nausea, vomiting and diarrhea.   Genitourinary: Negative.    Musculoskeletal: Negative.    Skin: Negative.  Negative for rash.   Neurological: Positive for weakness and headaches.        +change in sensation around R mouth    All other systems reviewed and are negative.      Filed Vitals:  01/20/14 1302   BP: 169/90   Pulse: 71   Resp: 18   Height: 6\' 1"  (1.854 m)   Weight: 99.791 kg (220 lb)   SpO2: 100%            Physical Exam   Nursing note and vitals reviewed.    Physical Examination:   General appearance - WDWN, in no apparent distress  Head - NC/AT  Eyes - pupils equal, round  and reactive, extraocular eye movements intact, conj/sclera clear, anicteric  Mouth - mucous membranes moist, pharynx normal without lesions  Nose/Ears - nares clear, Tms & canals clear  Neck - supple, no significant adenopathy, trachea midline, no crepitus, c spine diffusely non-tender, no step offs  Chest - Normal respiratory effort, clear to auscultation bilaterally, no wheezes/rales/rhonchi  Heart - normal rate and regular rhythm, S1 and S2 normal, no murmurs, gallops, or rubs  Abdomen - soft, nontender, nondistended, nabs, no masses, guarding, rebound or rigidity  Neurological - alert, oriented, normal speech, cranial nerves intact, 4/5 strength in RUE and RLE, tremulous   Extremities - peripheral pulses normal, no pedal edema, all joints atraumatic, FROM, non-tender, no gross deformities, spine diffusely non-tender  Skin - normal coloration and turgor, no rashes, no lesions or  lacerations    Written by Jaynee Eagles, ED Scribe, as dictated by Odis Hollingshead. Dorothey Baseman, MD     MDM  Number of Diagnoses or Management Options  Diagnosis management comments: DDx: CVA, TIA, Parkinson's        Amount and/or Complexity of Data Reviewed  Clinical lab tests: ordered and reviewed  Tests in the radiology section of CPT??: ordered and reviewed  Tests in the medicine section of CPT??: reviewed and ordered  Review and summarize past medical records: yes  Discuss the patient with other providers: yes  Independent visualization of images, tracings, or specimens: yes        Procedures    KRISTOPHER SISAK  January 24, 1951  Arrival time to ED: 1300  Code S Called: 1302  Physician at Bedside: 1300   CT Order Time: 1302  ACT Page: [phone]   ACT Call Back: 1310  Written by Jaynee Eagles, ED Scribe, as dictated by Odis Hollingshead. Dorothey Baseman, MD    CONSULT NOTE:   1:10 PM  Gregg Holster S. Dorothey Baseman, MD spoke with Dr. Allyne Gee,   Specialty: Tele-neurology  Discussed pt's hx, disposition, and available diagnostic and imaging results. Reviewed care plans. Consultant agrees with plans as outlined.  Call back in 10 minutes after CT results.   Written by Jaynee Eagles, ED Scribe, as dictated by Odis Hollingshead Dorothey Baseman, MD.    PROGRESS NOTE   1:17 PM  Spoke with pt's friend who he lives with; per friend, pt's woke her up reporting sx's at 55. Per friend, pt's last stroke was 8 years ago.   Written by Jaynee Eagles, ED Scribe, as dictated by Odis Hollingshead Dorothey Baseman, MD.     CONSULT NOTE:   1:27 PM  Jacob Cicero S. Dorothey Baseman, MD spoke with Dr. Allyne Gee,   Specialty: Tele-neurology  Discussed pt's hx, disposition, and available diagnostic and imaging results. Reviewed care plans. Consultant agrees with plans as outlined.  Will evaluate on camera.   Written by Jaynee Eagles, ED Scribe, as dictated by Odis Hollingshead Dorothey Baseman, MD.    EKG interpretation: (Preliminary)  Rhythm: normal sinus rhythm; and regular . Rate (approx.): 76; Axis:  normal; P wave: normal; QRS interval: normal ; ST/T wave: normal;   Written by Jaynee Eagles,  ED Scribe, as dictated by Theora Gianotti. Burt Ek, MD    CONSULT NOTE:   1:34 PM  [place]. Burt Ek, MD spoke with Dr. Otis Peak,   Specialty: Hospitalist  Discussed pt's hx, disposition, and available diagnostic and imaging results. Reviewed care plans. Consultant will evaluate pt for admission.  Written by Alcide Evener, ED Scribe, as dictated by Theora Gianotti Burt Ek, MD.    PROGRESS NOTE   1:35 PM  Dr. Baird Cancer evaluated pt, plan to not give tPA.   Written by Alcide Evener, ED Scribe, as dictated by Theora Gianotti Burt Ek, MD.     LABORATORY TESTS:  Recent Results (from the past 12 hour(s))   GLUCOSE, POC    Collection Time     01/20/14  1:04 PM       Result Value Ref Range    Glucose (POC) 108 (*) 65 - 100 mg/dL    Performed by Glenetta Borg     POC INR    Collection Time     01/20/14  1:06 PM       Result Value Ref Range    INR (POC) 1.0  <1.2     CBC WITH AUTOMATED DIFF    Collection Time     01/20/14  1:21 PM       Result Value Ref Range    WBC 4.6  4.1 - 11.1 K/uL    RBC 4.63  4.10 - 5.70 M/uL    HGB 15.9  12.1 - 17.0 g/dL    HCT 44.2  36.6 - 50.3 %    MCV 95.5  80.0 - 99.0 FL    MCH 34.3 (*) 26.0 - 34.0 PG    MCHC 36.0  30.0 - 36.5 g/dL    RDW 11.8  11.5 - 14.5 %    PLATELET 140 (*) 150 - 400 K/uL    NEUTROPHILS 67  32 - 75 %    LYMPHOCYTES 24  12 - 49 %    MONOCYTES 8  5 - 13 %    EOSINOPHILS 1  0 - 7 %    BASOPHILS 0  0 - 1 %    ABS. NEUTROPHILS 3.1  1.8 - 8.0 K/UL    ABS. LYMPHOCYTES 1.1  0.8 - 3.5 K/UL    ABS. MONOCYTES 0.4  0.0 - 1.0 K/UL    ABS. EOSINOPHILS 0.1  0.0 - 0.4 K/UL    ABS. BASOPHILS 0.0  0.0 - 0.1 K/UL   PROTHROMBIN TIME + INR    Collection Time     01/20/14  1:21 PM       Result Value Ref Range    INR 1.0  0.9 - 1.1      Prothrombin time 9.9  9.0 - 11.1 sec   PTT    Collection Time     01/20/14  1:21 PM       Result Value Ref Range    aPTT 25.9  22.1 - 32.5 sec    aPTT, therapeutic range       58.0 - 62.6 SECS   METABOLIC PANEL, BASIC    Collection Time     01/20/14  1:21 PM       Result Value Ref Range    Sodium 142  136 - 145 mmol/L    Potassium 3.9  3.5 - 5.1 mmol/L    Chloride 106  97 - 108 mmol/L    CO2 28  21 - 32 mmol/L    Anion gap 8  5 -  15 mmol/L    Glucose 102 (*) 65 - 100 mg/dL    BUN 9  6 - 20 MG/DL    Creatinine 0.87  0.45 - 1.15 MG/DL    BUN/Creatinine ratio 10 (*) 12 - 20      GFR est AA >60  >60 ml/min/1.75m2    GFR est non-AA >60  >60 ml/min/1.72m2    Calcium 8.5  8.5 - 10.1 MG/DL   CK W/ CKMB & INDEX    Collection Time     01/20/14  1:21 PM       Result Value Ref Range    CK 73  39 - 308 U/L    CK - MB 1.4  0.5 - 3.6 NG/ML    CK-MB Index 1.9  0.0 - 2.5     TROPONIN I    Collection Time     01/20/14  1:21 PM       Result Value Ref Range    Troponin-I, Qt. <0.04  <0.05 ng/mL       IMAGING RESULTS:  CT CODE NEURO HEAD WO CONTRAST (Final result) Result time: 01/20/14 13:23:54    Final result by Rad Results In Edi (01/20/14 13:23:54)    Narrative:    **Final Report**      ICD Codes / Adm.Diagnosis: 811914 / Extremity Weakness   Examination: CT CODE NEURO HEAD WO CON - [id number] - Jan 20 2014 1:13PM  Accession No: [id number]  Reason: right sided weakness, stroke      REPORT:  EXAM: CT CODE NEURO HEAD WO CON    INDICATION: right sided weakness, stroke    COMPARISON: None.    TECHNIQUE: Unenhanced CT of the head was performed using 5 mm images. Brain   and bone windows were generated.     FINDINGS:  The ventricles and sulci are normal in size, shape and configuration and   midline. There is no significant white matter disease. There is no   intracranial hemorrhage, extra-axial collection, mass, mass effect or   midline shift. The basilar cisterns are open. No acute infarct is   identified. The bone windows demonstrate no abnormalities. The visualized   portions of the paranasal sinuses and mastoid air cells are clear.      IMPRESSION: Normal CT scan of the head without contrast           Signing/Reading Doctor: Larene Pickett [phone])   Approved: Larene Pickett [phone]) Jan 20 2014 1:21PM        MEDICATIONS GIVEN:  Medications   sodium chloride (NS) flush 5-10 mL (10 mL IntraVENous Given 01/20/14 1400)   sodium chloride (NS) flush 5-10 mL (not administered)   aspirin (ASA) suppository 300 mg (300 mg Rectal Given 01/20/14 1402)       IMPRESSION:  No diagnosis found.    PLAN:  1. Admit    1:34 PM  Patient is being admitted to the hospital.  The results of their tests and reasons for their admission have been discussed with them and/or available family.  They convey agreement and understanding for the need to be admitted and for their admission diagnosis.  Consultation has been made with the inpatient physician specialist for hospitalization.  Written by Alcide Evener, ED Scribe, as dictated by Theora Gianotti Burt Ek, MD      I agree that the above documentation as written by ED Scribe is accurate and complete. Lacinda Axon, MD

## 2014-01-20 NOTE — Progress Notes (Addendum)
Pt provided number for "friend" who he lives with, [place] [phone]. Spoke with Romie Minus, she says that she is waiting for mail to come for pt's medications and then will be coming to ED. Pt gives consent for MD to speak to pt and share information. MD speaking with her now.    Marilynn Latino, MSW  [phone]    2:01 PM  Chaplain requested that I speak with friend about pt's advance directive. Romie Minus says that AD was completed 6 years ago at [place] and she does not know where it is. [facility] and left message requesting information.

## 2014-01-20 NOTE — H&P (Signed)
Dale Weaver  Admission History and Physical      NAME:  Dale Weaver   DOB:   Mar 16, 1951   MRN:  [id number]     PCP:  None     Date/Time:  01/20/2014          Assessment/Plan:       Headache and right vision loss  Right sided weakness  No facial symptoms except right facial numbness, motor strength 4+ in right arm but there is sensory deficit, right leg had motor and sensory deficit  With headche and vision loss concerned about temporal areteritis too  Will treat on line of CVA and TA  Tried to call neurology and discussed on phone and agreed to start on solumedrol 60 mg every 6 hour for now.  Will empirically start on high dose steroids, as i am concerned about vision loss  Will start on low ASA, plavix  Dysphagia screening  PT/OT eval  Neurology consult  Hba1c, fasting lipid panel  Fasting lipid panel  Resume rest of home medications       Subjective:     CHIEF COMPLAINT: headache, right sided vision loss and right sided weakness    HISTORY OF PRESENT ILLNESS:     Mr. Dale Weaver is a 63 y.o. with PMhx significant for back pain, CHF, CAD, hiatal hernia, Parkinson's, CVA presented ED via EMS with feeling numb on right side of face, right vision loss and weakness on right side. Symptoms started around 9 am and continued to progress. C/o difficulty swallowing, blurry vision in Right, change in sensation around R mouth, and headache. Pt says he has poor balance from parkinson's and had fall 2 weeks ago. Pt's strength was normal right arm but c/o more numbness and same with right leg. Pt has negative head CT, unable to get MRI/MRA because pt has a loop recorder.    Past Medical History   Diagnosis Date   ??? Back pain    ??? Hiatal hernia    ??? Heart failure (HCC)      CHF   ??? CAD (coronary artery disease)      cardiac blockage        Past Surgical History   Procedure Laterality Date   ??? Hx orthopaedic       back surgery x3, last June 2010   ??? Hx appendectomy     ??? Hx cholecystectomy     ??? Hx hernia  repair     ??? Pr cardiac surg procedure unlist       cardiac cath August 2009   ??? Pr cardiac surg procedure unlist       x 1 stent, [facility]   ??? Pr cardiac surg procedure unlist       insertion of loop recorder-no MRI       History   Substance Use Topics   ??? Smoking status: Never Smoker    ??? Smokeless tobacco: Not on file   ??? Alcohol Use: No        History reviewed. No pertinent family history.     Allergies   Allergen Reactions   ??? Seafood [Shellfish Containing Products] Hives   ??? Sulfa (Sulfonamide Antibiotics) Hives   ??? Zoloft [Sertraline] Itching        Prior to Admission medications    Medication Sig Start Date End Date Taking? Authorizing Provider   mirtazapine (REMERON) 15 mg tablet Take 10 mg by mouth nightly.   Yes  Phys Other, MD   carbidopa-levodopa (SINEMET) 10-100 mg per tablet Take 2 Tabs by mouth two (2) times a day.   Yes Historical Provider   HYDROcodone-acetaminophen (NORCO) 7.5-325 mg per tablet Take 1 Tab by mouth every six (6) hours as needed for Pain.   Yes Historical Provider   ALPRAZolam Duanne Moron) 1 mg tablet Take 1 mg by mouth.     Yes Phys Other, MD   GABAPENTIN, BULK, 400 mg by Does Not Apply route three (3) times daily.   Yes Phys Other, MD   ARIPIPRAZOLE (ABILIFY PO) Take  by mouth.     Yes Phys Other, MD   aspirin 81 mg chewable tablet Take 81 mg by mouth daily.     Yes Phys Other, MD   oxyCODONE-acetaminophen (PERCOCET) 5-325 mg per tablet Take 1 tablet by mouth every four (4) hours as needed for Pain. 09/11/13   Ander Purpura Flasschoen, PA   diazepam (VALIUM) 5 mg tablet Take 1 tablet by mouth every eight (8) hours as needed (muscle spasm). 09/11/13   Ander Purpura Flasschoen, PA   meloxicam (MOBIC) 15 mg tablet Take 1 tablet by mouth daily. 09/11/13   Ander Purpura Flasschoen, PA   FLUOXETINE HCL (PROZAC PO) Take  by mouth.      Phys Other, MD         Review of Systems:    Constitutional: negative for fevers, chills, sweats, fatigue, malaise, anorexia and weight loss   Eyes: negative for  irritation, redness and icterus   Ears, nose, mouth, throat, and face: negative for ear drainage, earaches, nasal congestion, sore mouth and sore throat   Respiratory: negative for cough, sputum, hemoptysis, pleurisy/chest pain, asthma, wheezing or dyspnea on exertion   Cardiovascular: negative for dyspnea, palpitations, irregular heart beats, near-syncope, syncope, orthopnea, paroxysmal nocturnal dyspnea, lower extremity edema, tachypnea   Gastrointestinal: negative for nausea, vomiting, diarrhea, constipation and abdominal pain   Genitourinary:negative for frequency and dysuria   Hematologic/lymphatic: negative for easy bruising, bleeding, lymphadenopathy, petechiae and coughing up blood   Musculoskeletal:negative for myalgias, arthralgias and muscle weakness   Neurological: as per hpi  Endocrine: Denies heat or cold intolerance       Objective:      VITALS:    Vital signs reviewed; most recent are:    Visit Vitals   Item Reading   ??? BP 137/90   ??? Pulse 71   ??? Temp 97.9 ??F (36.6 ??C)   ??? Resp 22   ??? Ht 6\' 1"  (1.854 m)   ??? Wt 99.791 kg (220 lb)   ??? BMI 29.03 kg/m2   ??? SpO2 95%     SpO2 Readings from Last 6 Encounters:   01/20/14 95%   09/11/13 100%   05/15/12 94%   05/15/12 93%   04/24/11 96%   01/15/11 99%          Intake/Output Summary (Last 24 hours) at 01/20/14 1906  Last data filed at 01/20/14 1736   Gross per 24 hour   Intake      0 ml   Output    300 ml   Net   -300 ml            Exam:     Physical Exam:    Gen:  Well-developed, well-nourished, in no acute distress  HEENT:  Pink conjunctivae, PERRL, hearing intact to voice, moist mucous membranes  Neck:  Supple, without masses, thyroid non-tender  Resp:  No accessory muscle use, clear breath  sounds without wheezes rales or rhonchi  Card:  No murmurs, normal S1, S2 without thrills, bruits or peripheral edema  Abd:  Soft, non-tender, non-distended, normoactive bowel sounds are present, no palpable organomegaly  Lymph:  No cervical adenopathy  Musc:  No cyanosis  or clubbing  Skin:  No rashes or ulcers, skin turgor is good  Neuro:  As per hpi  Psych:  Alert with good insight.  Oriented to person, place, and time       Labs:    Recent Labs      01/20/14   1321   WBC  4.6   HGB  15.9   HCT  44.2   PLT  140*     Recent Labs      01/20/14   1321   NA  142   K  3.9   CL  106   CO2  28   GLU  102*   BUN  9   CREA  0.87   CA  8.5     No components found with this basename: glpoc     No results for input(s): PH, PCO2, PO2, HCO3, FIO2 in the last 72 hours.  Recent Labs      01/20/14   1321   INR  1.0          Total time spent with patient: 50 [place] discussed with: Patient    Discussed:  Care Plan    Prophylaxis:  Lovenox    Probable Disposition:  Home w/Family           ___________________________________________________    Attending Physician: Jac Canavan, MD

## 2014-01-20 NOTE — Progress Notes (Signed)
Stroke Education provided to patient and the following topics were discussed    1. Patients personal risk factors for stroke are hypertension    2. Warning signs of Stroke:        * Sudden numbness or weakness of the face, arm or leg, especially on one side of          The body            * Sudden confusion, trouble speaking or understanding        * Sudden trouble seeing in one or both eyes        * Sudden trouble walking, dizziness, loss of balance or coordination        * Sudden severe headache with no known cause      3. Importance of activation Emergency Medical Services ( 9-1-1 ) immediately if experience any warning signs of stroke.    4. Be sure and schedule a follow-up appointment with your primary care doctor or any specialists as instructed.     5. You must take medicine every day to treat your risk factors for stroke.  Be sure to take your medicines exactly as your doctor tells you: no more, no less.  Know what your medicines are for , what they do.  Anti-thrombotics /anticoagulants can help prevent strokes.  You are taking the following medicine(s)  Plavix    6.  Smoking and second-hand smoke greatly increase your risk of stroke, cardiovascular disease and death. Smoking history never    7. Information provided was [facility] Stroke Education Binder    8. Documentation of teaching completed in Patient Education Activity and on Care Plan with teaching response noted?  yes

## 2014-01-20 NOTE — ED Notes (Signed)
TRANSFER - OUT REPORT:    Verbal report given to [employer]) on [employer]  being transferred to 212(unit) for routine progression of care       Report consisted of patient???s Situation, Background, Assessment and   Recommendations(SBAR).     Information from the following report(s) SBAR, Kardex and ED Summary was reviewed with the receiving nurse.    Lines:   Peripheral IV 05/15/12 Left Other(comment) (Active)       Peripheral IV 01/20/14 Left Hand (Active)   Site Assessment Clean, dry, & intact 01/20/2014  1:26 PM   Phlebitis Assessment 0 01/20/2014  1:26 PM   Infiltration Assessment 0 01/20/2014  1:26 PM   Dressing Status Clean, dry, & intact 01/20/2014  1:26 PM   Dressing Type Transparent 01/20/2014  1:26 PM   Hub Color/Line Status Flushed 01/20/2014  1:26 PM   Action Taken Blood drawn 01/20/2014  1:26 PM       Peripheral IV 01/20/14 Right Antecubital (Active)   Site Assessment Clean, dry, & intact 01/20/2014  2:08 PM   Phlebitis Assessment 0 01/20/2014  2:08 PM   Infiltration Assessment 0 01/20/2014  2:08 PM   Dressing Status Clean, dry, & intact 01/20/2014  2:08 PM   Dressing Type Transparent 01/20/2014  2:08 PM   Hub Color/Line Status Pink;Flushed 01/20/2014  2:08 PM        Opportunity for questions and clarification was provided.      Patient transported with:   Monitor  O2 @ 3 liters  Registered Nurse

## 2014-01-20 NOTE — Progress Notes (Signed)
Spiritual Care Assessment/Progress Notes    Dale Weaver [id number]  [id number]    [birth year]  63 y.o.  male    Patient Telephone Number: [phone] (home)   Religious Affiliation: Catholic   Language: English   Extended Emergency Contact Information  Primary Emergency Contact: [place] Phone: [phone]  Relation: Friend   Patient Active Problem List    Diagnosis Date Noted   ??? CVA (cerebral infarction) (Dale Weaver) 01/20/2014        Date: 01/20/2014       Level of Religious/Spiritual Activity:  []          Involved in faith tradition/spiritual practice    []          Not involved in faith tradition/spiritual practice  []          Spiritually oriented    []          Claims no spiritual orientation    []          seeking spiritual identity  []          Feels alienated from religious practice/tradition  []          Feels angry about religious practice/tradition  []          Spirituality/religious tradition could be a Theatre manager for coping at this time.  []          Not able to assess due to medical condition    Services Provided Today:  []          crisis intervention    []          reading Scriptures  [x]          spiritual assessment    []          prayer  []          empathic listening/emotional support  []          rites and rituals (cite in comments)  []          life review     []          religious support  []          theological development    []          advocacy  []          ethical dialog     []          blessing  []          bereavement support    []          support to family  []          anticipatory grief support   [x]          help with AMD  []          spiritual guidance    []          meditation      Spiritual Care Needs  []          Emotional Support  []          Spiritual/Religious Care  []          Loss/Adjustment  [x]          Advocacy/Referral /Ethics  []          No needs expressed at this time  []          Other: (note in comments)  Spiritual Care Plan  []          Follow up visits with  pt/family  []   Provide materials  []          Schedule sacraments  []          Contact Community Clergy  [x]          Follow up as needed  []          Other: (note in comments)     Comments:   Responded to Code Stroke in ER.  Provided pastoral presence and support to patient.  No visitors present.  Patient has no family.  He lives with a friend.  Inquired if he has an Doctor, hospital and he replied that he did.  He does not, however, know where it is.  Care Manager in [place] called [facility] and they are sending a copy to [facility].  Offered words of encouragement and assurance document will be in place here.  Jearld Fenton, Staff Chaplain    Chaplain Paging Service  [phone])

## 2014-01-20 NOTE — Progress Notes (Signed)
Pt complaining of 10/10 headache not relieved by hydrocodone, notified Dr. Otis Peak, new orders received.

## 2014-01-20 NOTE — ED Notes (Signed)
Tele-neuro done. Pt. Alert and oriented x 3 no distress.

## 2014-01-20 NOTE — Progress Notes (Signed)
Bedside shift change report given to Dawn RN (oncoming nurse) by Teresa RN (offgoing nurse). Report included the following information SBAR.

## 2014-01-20 NOTE — Progress Notes (Signed)
Care Management Interventions  PCP Verified by CM?: Yes  Advanced Directive and POA/Guardian:  (none reported. )  Care Management Consult: Yes  Physical Therapy Consult: Yes  Speech Therapy Consult: No  Current Support Network: Other (lives with a roommate. )  Plan discussed with Pt/Family/Caregiver: Yes  Discharge Location  Discharge Placement: Home     Readmission Risk Assessment:     Low Risk and MSSP/Good Help ACO patients    RRAT Score:  1 - 9    Initial Assessment: Cm met with pt for initial assessment and discharge coordination. Pt confirmed face sheet information to be correct. Pt reported living with a friend, his emergency contact. Pt reported no stairs in the residence. Pt reported mobility issues related to chronic back pain. Pt stated that this pain is managed by his primary care physician. Pt reported anxiety and depression, also managed with medications prescribed by his PCP.      Pt confirmed self-pay status. Pt reported that he acquired medications through [facility].      Emergency Contact:  Romie Minus [phone], pt's friend.       Pertinent Medical Hx:         PCP/Specialists: Dr. Brigitte Pulse at [facility].       Community Services:   None reported.     DME:   Pt reported using a cane.       Low Risk Care Transition Plan:  1. Evaluate for [facility] or H2H, community care coordination of resources Pt denied HH. Cm will continue to evaluate for need for resources.   2. Involve patient/caregiver in assessment, planning, education and implement of intervention. Cm will continue to involve pt in assessment and planning.   3. CM daily patient care huddles/interdisciplinary rounds. CM will discuss with MD and RN.   4. PCP/Specialist appointment within 7 - 10 days made prior to discharge. Pending.   5. Facilitate transportation and logistics for follow-up appointments. Pt reported that friends transport him.   6. Handoff to [place] or PCP practice.  -Birdie Hopes, MSW  -[phone]

## 2014-01-21 LAB — EKG, 12 LEAD, INITIAL
Atrial Rate: 76 {beats}/min
Calculated P Axis: 46 degrees
Calculated R Axis: 41 degrees
Calculated T Axis: 32 degrees
P-R Interval: 158 ms
Q-T Interval: 408 ms
QRS Duration: 90 ms
QTC Calculation (Bezet): 459 ms
Ventricular Rate: 76 {beats}/min

## 2014-01-21 LAB — CRP, HIGH SENSITIVITY: CRP, High sensitivity: 0.4 mg/L

## 2014-01-21 LAB — GLUCOSE, POC: Glucose (POC): 157 mg/dL — ABNORMAL HIGH (ref 65–100)

## 2014-01-21 LAB — HEMOGLOBIN A1C WITH EAG
Est. average glucose: 108 mg/dL
Hemoglobin A1c: 5.4 % (ref 4.2–6.3)

## 2014-01-21 LAB — LIPID PANEL
CHOL/HDL Ratio: 3.1 (ref 0–5.0)
Cholesterol, total: 167 MG/DL (ref <200)
HDL Cholesterol: 54 MG/DL
LDL, calculated: 96.4 MG/DL (ref 0–100)
Triglyceride: 83 MG/DL (ref <150)
VLDL, calculated: 16.6 MG/DL

## 2014-01-21 LAB — SED RATE (ESR): Sed rate, automated: 1 mm/hr (ref 0–20)

## 2014-01-21 MED ORDER — SODIUM CHLORIDE 0.9 % IJ SYRG
Freq: Once | INTRAMUSCULAR | Status: AC
Start: 2014-01-21 — End: 2014-01-21
  Administered 2014-01-21: 17:00:00 via INTRAVENOUS

## 2014-01-21 MED ORDER — IOPAMIDOL 76 % IV SOLN
370 mg iodine /mL (76 %) | Freq: Once | INTRAVENOUS | Status: AC
Start: 2014-01-21 — End: 2014-01-21
  Administered 2014-01-21: 17:00:00 via INTRAVENOUS

## 2014-01-21 MED ADMIN — methylPREDNISolone (PF) (SOLU-MEDROL) injection 60 mg: INTRAVENOUS | @ 01:00:00 | NDC 00009003930

## 2014-01-21 MED ADMIN — methylPREDNISolone (PF) (SOLU-MEDROL) injection 60 mg: INTRAVENOUS | @ 04:00:00 | NDC 00009003930

## 2014-01-21 MED ADMIN — sodium chloride (NS) flush 5-10 mL: INTRAVENOUS | @ 22:00:00 | NDC 87701099893

## 2014-01-21 MED ADMIN — sodium chloride (NS) flush 5-10 mL: INTRAVENOUS | @ 09:00:00 | NDC 87701099893

## 2014-01-21 MED ADMIN — 0.9% sodium chloride infusion 30 mL: INTRAVENOUS | @ 21:00:00 | NDC 00338004941

## 2014-01-21 MED ADMIN — morphine injection 2 mg: INTRAVENOUS | @ 17:00:00 | NDC 00409189001

## 2014-01-21 MED ADMIN — sodium chloride (NS) flush 5-10 mL: INTRAVENOUS | @ 01:00:00 | NDC 87701099893

## 2014-01-21 MED ADMIN — carbidopa-levodopa (SINEMET) 10-100 mg per tablet 1 Tab: ORAL | @ 01:00:00 | NDC 63739004610

## 2014-01-21 MED ADMIN — ketorolac (TORADOL) injection 15 mg: INTRAVENOUS | @ 16:00:00 | NDC 63323016201

## 2014-01-21 MED ADMIN — ketorolac (TORADOL) injection 15 mg: INTRAVENOUS | @ 08:00:00 | NDC 63323016201

## 2014-01-21 MED ADMIN — propranolol (INDERAL) tablet 40 mg: ORAL | @ 22:00:00 | NDC 00904041161

## 2014-01-21 MED ADMIN — morphine injection 2 mg: INTRAVENOUS | @ 22:00:00 | NDC 00409189001

## 2014-01-21 MED ADMIN — carbidopa-levodopa (SINEMET) 10-100 mg per tablet 1 Tab: ORAL | @ 14:00:00 | NDC 63739004610

## 2014-01-21 MED ADMIN — aspirin chewable tablet 81 mg: ORAL | @ 14:00:00 | NDC 63739043401

## 2014-01-21 MED ADMIN — carbidopa-levodopa (SINEMET) 10-100 mg per tablet 1 Tab: ORAL | @ 22:00:00 | NDC 63739004610

## 2014-01-21 MED ADMIN — methylPREDNISolone (PF) (SOLU-MEDROL) injection 60 mg: INTRAVENOUS | @ 09:00:00 | NDC 00009003930

## 2014-01-21 MED FILL — KETOROLAC TROMETHAMINE 30 MG/ML INJECTION: 30 mg/mL (1 mL) | INTRAMUSCULAR | Qty: 1

## 2014-01-21 MED FILL — SOLU-MEDROL (PF) 40 MG/ML SOLUTION FOR INJECTION: 40 mg/mL | INTRAMUSCULAR | Qty: 2

## 2014-01-21 MED FILL — BD POSIFLUSH NORMAL SALINE 0.9 % INJECTION SYRINGE: INTRAMUSCULAR | Qty: 10

## 2014-01-21 MED FILL — PROPRANOLOL 10 MG TAB: 10 mg | ORAL | Qty: 4

## 2014-01-21 MED FILL — MORPHINE 2 MG/ML INJECTION: 2 mg/mL | INTRAMUSCULAR | Qty: 1

## 2014-01-21 MED FILL — CHILDREN'S ASPIRIN 81 MG CHEWABLE TABLET: 81 mg | ORAL | Qty: 1

## 2014-01-21 MED FILL — CARBIDOPA-LEVODOPA 10 MG-100 MG TAB: 10-100 mg | ORAL | Qty: 1

## 2014-01-21 MED FILL — BD POSIFLUSH NORMAL SALINE 0.9 % INJECTION SYRINGE: INTRAMUSCULAR | Qty: 20

## 2014-01-21 NOTE — Progress Notes (Addendum)
[facility] PHARMACY STATIN RECOMMENDATIONS  For patients with history of ischemic stroke or transient ischemic attack (TIA).    Statin Therapy Recommendation: Patient has clinical ASCVD and is < 64 years old and meets benefit group for high intensity statin. Atorvastatin 66m QHS is recommended.    The patient Dale Weaver a 63y.o. male.    StatinTherapy Status: New Start   Review of Possible Drug Interactions: None   Clinical ASCVD Group  < 764years old: High Intensity Statin       Past Medication History  Past Medical History   Diagnosis Date   ??? Back pain    ??? Hiatal hernia    ??? Heart failure (HCC)      CHF   ??? CAD (coronary artery disease)      cardiac blockage         Lipid Panel  Lab Results   Component Value Date/Time    Cholesterol, total 167 01/21/2014  3:56 AM    HDL Cholesterol 54 01/21/2014  3:56 AM    LDL, calculated 96.4 01/21/2014  3:56 AM    VLDL, calculated 16.6 01/21/2014  3:56 AM    Triglyceride 83 01/21/2014  3:56 AM    CHOL/HDL Ratio 3.1 01/21/2014  3:56 AM         Hepatic Function  Lab Results   Component Value Date/Time    Sodium 142 01/20/2014  1:21 PM    Potassium 3.9 01/20/2014  1:21 PM    Chloride 106 01/20/2014  1:21 PM    CO2 28 01/20/2014  1:21 PM    Anion gap 8 01/20/2014  1:21 PM    Glucose 102 01/20/2014  1:21 PM    BUN 9 01/20/2014  1:21 PM    Creatinine 0.87 01/20/2014  1:21 PM    BUN/Creatinine ratio 10 01/20/2014  1:21 PM    GFR est AA >60 01/20/2014  1:21 PM    GFR est non-AA >60 01/20/2014  1:21 PM    Calcium 8.5 01/20/2014  1:21 PM    AST 10 05/15/2012  2:45 PM    Alk. phosphatase 83 05/15/2012  2:45 PM    Protein, total 6.5 05/15/2012  2:45 PM    Albumin 3.8 05/15/2012  2:45 PM    Globulin 2.7 05/15/2012  2:45 PM    A-G Ratio 1.4 05/15/2012  2:45 PM    ALT 19 05/15/2012  2:45 PM       Pregnancy Test  No results found for this basename: [place] [id number] PREGU, POCHCG, 17907, 4416, 4556, MHCGN, HCGQR, THCGA1, SHCG, HCGQ, HCGN, HCGB, HCGURQLPOC       Statin Intensity Group High-intensity  Moderate-intensity Low-intensity   % LDL-C Reduction ?50% 30 to <50% <30%    Statins and Dosages Atorvastatin 40???80 mg  Rosuvastatin 20-40 mg Atorvastatin 10-20 mg  Rosuvastatin 5-10 mg  Simvastatin 20???40 mg  Pravastatin 40-80 mg  Lovastatin 40 mg  Fluvastatin XL 80 mg  Fluvastatin 40 mg BID  Pitavastatin 2???4 mg Simvastatin 10 mg  Pravastatin 10???20 mg  Lovastatin 20 mg  Fluvastatin 20???40 mg  Pitavastatin 1 mg         Atorvastatin   Autosubtitution for Simvastatin > 40 mg, Pitavastatin > 1 mg, and Rosuvastatin Fluvastatin Lovastatin Pravastatin  Autosubstitution for Fluvastatin, Lovastatin, and Simvastatin < 40 mg Simvastatin Pitavastatin Rosuvastatin    20 mg 10 mg 10 mg 5 mg     10 mg 40 mg 20 mg 20 mg 10  mg 1 mg    10 mg 80 mg 40 mg 40 mg 20 mg 2 mg 5 mg   20 mg  80 mg 80 mg 40 mg 4 mg 10 mg   40 mg    80 mg  20 mg   80 mg      40 mg

## 2014-01-21 NOTE — Progress Notes (Signed)
[place] MD    Hospitalist Progress Note      NAME: Dale Weaver   DOB:  1950/09/15  MRM:  [id number]    Date/Time: 01/21/2014  12:47 PM         Assessment / Plan:         Headache and right vision loss and Right sided weakness likely CVA: POA  Neurology -low probability for GCA, recommended d/c steroids  Will d/c solumedrol  Will order head and neck CTA, echocardiogram, carotid dopplers  Continue ASA  passed dysphagia screen, on po diet  PT/OT eval  Neurology consult  Hba1c, fasting lipid panel  LDL 96  Continue rest of home medication              Subjective:     Chief Complaint:  Right sided weakness improving but headache and vision still bothering. Right vision loss persistent.    ROS:  Constitutional: negative for fevers, chills, sweats, fatigue, malaise, anorexia and weight loss   Eyes: negative for irritation, redness and icterus   Ears, nose, mouth, throat, and face: negative for ear drainage, earaches, nasal congestion, sore mouth and sore throat   Respiratory: negative for cough, sputum, hemoptysis, pleurisy/chest pain, asthma, wheezing or dyspnea on exertion   Cardiovascular: negative for dyspnea, palpitations, irregular heart beats, near-syncope, syncope, orthopnea, paroxysmal nocturnal dyspnea, lower extremity edema, tachypnea   Gastrointestinal: negative for nausea, vomiting, diarrhea, constipation and abdominal pain   Genitourinary:negative for frequency and dysuria   Hematologic/lymphatic: negative for easy bruising, bleeding, lymphadenopathy, petechiae and coughing up blood   Musculoskeletal:negative for myalgias, arthralgias and muscle weakness   Neurological: negative for headaches and dizziness   Endocrine: Denies heat or cold intolerance       Objective:       Vitals:          Last 24hrs VS reviewed since prior progress note. Most recent are:    Visit Vitals   Item Reading   ??? BP 125/85   ??? Pulse 61   ??? Temp 96.9 ??F (36.1 ??C)   ??? Resp 10   ??? Ht 6\' 1"  (1.854  m)   ??? Wt 98.294 kg (216 lb 11.2 oz)   ??? BMI 28.60 kg/m2   ??? SpO2 93%     SpO2 Readings from Last 6 Encounters:   01/21/14 93%   09/11/13 100%   05/15/12 94%   05/15/12 93%   04/24/11 96%   01/15/11 99%          Intake/Output Summary (Last 24 hours) at 01/21/14 1247  Last data filed at 01/21/14 0954   Gross per 24 hour   Intake      0 ml   Output    400 ml   Net   -400 ml          Exam:     Physical Exam:    Gen:  Well-developed, well-nourished, in no acute distress  HEENT:  Pink conjunctivae, PERRL, hearing intact to voice, moist mucous membranes  Neck:  Supple, without masses, thyroid non-tender  Resp:  No accessory muscle use, clear breath sounds without wheezes rales or rhonchi  Card:  No murmurs, normal S1, S2 without thrills, bruits or peripheral edema  Abd:  Soft, non-tender, non-distended, normoactive bowel sounds are present  Musc:  No cyanosis or clubbing  Skin:  No rashes or ulcers, skin turgor is good  Neuro:  Power 4/5 right side, 5/5 left  side, persistent right vision loss  Psych:  Good insight, oriented to person, place and time, alert    Medications Reviewed: (see below)    Lab Data Reviewed: (see below)    ______________________________________________________________________    Medications:     Current Facility-Administered Medications   Medication Dose Route Frequency   ??? morphine injection 2 mg  2 mg IntraVENous Q4H PRN   ??? propranolol (INDERAL) tablet 40 mg  40 mg Oral BID   ??? iopamidol (ISOVUE-370) 76 % injection 100 mL  100 mL IntraVENous RAD ONCE   ??? sodium chloride (NS) flush 10 mL  10 mL IntraVENous RAD ONCE   ??? sodium chloride (NS) flush 5-10 mL  5-10 mL IntraVENous Q8H   ??? sodium chloride (NS) flush 5-10 mL  5-10 mL IntraVENous PRN   ??? aspirin chewable tablet 81 mg  81 mg Oral DAILY   ??? sodium chloride (NS) flush 5-10 mL  5-10 mL IntraVENous Q8H   ??? sodium chloride (NS) flush 5-10 mL  5-10 mL IntraVENous PRN   ??? HYDROcodone-acetaminophen (NORCO) 7.5-325 mg per tablet 1 Tab  1 Tab Oral Q6H  PRN   ??? acetaminophen (TYLENOL) tablet 650 mg  650 mg Oral Q6H PRN   ??? ketorolac (TORADOL) injection 15 mg  15 mg IntraVENous Q6H PRN   ??? carbidopa-levodopa (SINEMET) 10-100 mg per tablet 1 Tab  1 Tab Oral BID            Lab Review:     Recent Labs      01/20/14   1321   WBC  4.6   HGB  15.9   HCT  44.2   PLT  140*     Recent Labs      01/20/14   1321  01/20/14   1306   NA  142   --    K  3.9   --    CL  106   --    CO2  28   --    GLU  102*   --    BUN  9   --    CREA  0.87   --    CA  8.5   --    INR  1.0  1.0     No components found with this basename: glpoc           Total time spent with patient: 98 [place] discussed with: Patient    Discussed:  Care Plan    Prophylaxis:  Lovenox    Disposition:  Home w/Family           ___________________________________________________    Attending Physician: Jac Canavan, MD

## 2014-01-21 NOTE — Progress Notes (Signed)
Echocardiogram 2D complete adult completed as ordered.?? Procedure explained. Full report to follow.

## 2014-01-21 NOTE — Progress Notes (Addendum)
Problem: Self Care Deficits Care Plan (Adult)  Goal: *Acute Goals and Plan of Care (Insert Text)  Occupational Therapy Goals  Initiated 01/21/2014  1. Patient will perform self-feeding with supervision/set-up within 7 day(s).  2. Patient will perform grooming with supervision/set-up within 7 day(s).  3. Patient will perform toileting with supervision/set-up within 7 day(s).  4. Patient will perform toilet transfers with modified independence within 7 day(s).  5. Patient will perform all aspects of toileting with modified independence within 7 day(s).  6. Patient will participate in upper extremity therapeutic exercise/activities with independence for 10 minutes within 7 day(s).   OCCUPATIONAL THERAPY EVALUATION with functional measure  Patient: Dale Weaver (63 y.o. male)  Date: 01/21/2014  Primary Diagnosis: CVA (cerebral infarction) ([place])        Precautions:          ASSESSMENT :  Based on the objective data described below, the patient presents with decreased balance, weakness in R UE, R vision loss and blurry vision limiting near and far vision (unable to watch TV/read without increased headache), difficulty completing ADls due to B hand tremors which are normally controlled by medicine, currently off normal med routine, L hand resting tremor as well, difficulty wit self feeding, normally uses weighted battery utinciles for self feeding to control tremors. Roommate assists with cooking/home tasks, patient normally independent, working in garden outside without AD, but using [place] in community. Currently SBA for toilet transfer due to fair balance. R UE full AROM but fair coordination and strength. Decreased sensation on R UE and LE as well. Feel patient will progress well    Patient will benefit from skilled intervention to address the above impairments.  Patient???s rehabilitation potential is considered to be Good  Factors which may influence rehabilitation potential include:   [ ]                 None noted  [ ]                  Mental ability/status  [ ]                 Medical condition  [X]                 Home/family situation and support systems  [ ]                 Safety awareness  [ ]                 Pain tolerance/management  [ ]                 Other:        PLAN :  Recommendations and Planned Interventions:  [X]                   Self Care Training                  [X]            Therapeutic Activities  [X]                   Functional Mobility Training    [ ]            Cognitive Retraining  [X]                   Therapeutic Exercises           [X]            Endurance Activities  [X]   Balance Training                   [X]            Neuromuscular Re-Education  [ ]                   Visual/Perceptual Training     [ ]       Home Safety Training  [X]                   Patient Education                 [ ]            Family Training/Education  [ ]                   Other (comment):    Frequency/Duration: Patient will be followed by occupational therapy 3-5 times a week to address goals.  Discharge Recommendations: Home Health  Further Equipment Recommendations for Discharge: TBD, has ramp and grab bars in shower and by commode       SUBJECTIVE:   Patient stated ???I have a garden that I tend to.???      OBJECTIVE DATA SUMMARY:       Past Medical History   Diagnosis Date   ??? Back pain     ??? Hiatal hernia     ??? Heart failure (HCC)         CHF   ??? CAD (coronary artery disease)         cardiac blockage     Past Surgical History   Procedure Laterality Date   ??? Hx orthopaedic           back surgery x3, last June 2010   ??? Hx appendectomy       ??? Hx cholecystectomy       ??? Hx hernia repair       ??? Pr cardiac surg procedure unlist           cardiac cath August 2009   ??? Pr cardiac surg procedure unlist           [facility] 1 [facility]   ??? Pr cardiac surg procedure unlist           insertion of loop recorder-no MRI     Prior Level of Function/Home Situation: lives with roommate, normally mod I with SPC in community, no AD  around home, uses raised toilet seat, normally tremors are controlled by medicine, roommates assists home making/cooking, uses weighted spoons/forks with batteries that assist in controlling tremors from [place]: Private residence  One/Two Story Residence: One story  Living Alone: No  Support Systems: Friends \\ neighbors  Patient Expects to be Discharged to:: Private residence  Current DME Used/Available at Home: None  [X]      Right hand dominant            [ ]      Left hand dominant  Cognitive/Behavioral Status:  Neurologic State: Alert  Orientation Level: Oriented X4  Cognition: Appropriate decision making;Appropriate safety awareness;Appropriate for age attention/concentration  Perception: Appears intact  Perseveration: No perseveration noted  Safety/Judgement: Insight into deficits  Skin: intact  Edema: none noted  Vision/Perceptual:    Tracking: Able to track right of midline              Visual Fields: Difficulty detecting stimulus  in right lateral quadrant (some perpherial vision loss)  Diplopia: Yes    Acuity: Impaired far vision;Impaired near vision (due to R vision blurry limiting focus and acuity)       Coordination:  Coordination: Generally decreased, functional  Fine Motor Skills-Upper: Left Impaired;Right Impaired (due to tremors)       Balance:  Sitting: Intact  Standing: Impaired  Standing - Static: Good  Standing - Dynamic : Fair  Strength:  B UE   Strength: Generally decreased, functional (R UE 3+/5 unable to withstand resistance)              Tone & Sensation:  B UE   Tone: Normal  Sensation: Impaired (intact to light touch, but difficulty detecting)                    Range of Motion:  B UE   AROM: Generally decreased, functional (full ROM on R UE limited only by tremor with activity)                       Functional Mobility and Transfers for ADLs:  Bed Mobility:  Rolling: Additional time  Supine to Sit: Supervision  Sit to Supine: Additional time  Scooting:  Supervision  Transfers:  Sit to Stand: Additional time;CGA                                      Toilet Transfer : Contact guard assistance                                               ADL Assessment:  Feeding: Minimum assistance (wtih R UE due to tremor, full AROM)    Oral Facial Hygiene/Grooming: Minimum assistance    Bathing: Additional time;Supervision/set up    Upper Body Dressing: Minimum assistance;Additional time (assist with buttons)    Lower Body Dressing: Minimum assistance (for shoes, FM tasks)    Toileting: Stand by assistance;Additional time (fair balance)              ADL Intervention:     Completed OT evaluation and ADLs seated EOB and standing as able without AD for balance. Slight unsteadiness. Educated on safety and endurance training with encouragement for full participation in ADLs while in hospital. Good understanding noted.   Limited due to R vision loss, blurry vision limiting participation, unable to watch TV or see food on tray easily, requires head turning and scanning            Cognitive Retraining  Safety/Judgement: Insight into deficits  Functional Measure:   Barthel Index:      Bathing: 0  Bladder: 10  Bowels: 10  Grooming: 0  Dressing: 5  Feeding: 5  Grooming: 0  Mobility: 10  Stairs: 0  Toilet Use: 5  Transfer (Bed to Chair and Back): 10  Total: 55        Barthel and G-code impairment scale:  Percentage of impairment CH  0% CI  1-19% CJ  20-39% CK  40-59% CL  60-79% CM  80-99% CN  100%   Barthel Score 0-100 100 99-80 79-60 59-40 20-39 1-19    0   Barthel Score 0-20 20 17-19 13-16 9-12 5-8 1-4 0      The Barthel ADL Index: Guidelines  1. The index  should be used as a record of what a patient does, not as a record of what a patient could do.  2. The main aim is to establish degree of independence from any help, physical or verbal, however minor and for whatever reason.  3. The need for supervision renders the patient not independent.  4. A patient's performance should be established  using the best available evidence. Asking the patient, friends/relatives and nurses are the usual sources, but direct observation and common sense are also important. However direct testing is not needed.  5. Usually the patient's performance over the preceding 24-48 hours is important, but occasionally longer periods will be relevant.  6. Middle categories imply that the patient supplies over 50 per cent of the effort.  7. Use of aids to be independent is allowed.    Daneen Schick., Barthel, D.W. [phone]). Functional evaluation: the Barthel Index. [place] (14)2.  Lucianne Lei der Haines, J.J.M.F, Bradner, Diona Browner., Oris Drone., [place], [place] (1999). Measuring the change indisability after inpatient rehabilitation; comparison of the responsiveness of the Barthel Index and Functional Independence Measure. Journal of Neurology, Neurosurgery, and Psychiatry, 66(4), [phone].  Wilford Sports, N.J.A, [place],  W.J.M, & Koopmanschap, M.A. (2004.) Assessment of post-stroke quality of life in cost-effectiveness studies: The usefulness of the Barthel Index and the EuroQoL-5D. Quality of Life Research, 13, [phone]         Therapeutic Exercise:  encouraged OOB and full participation with ADLs to improve strength and endurance.    Pain:  Pain Scale 1: Numeric (0 - 10)  Pain Intensity 1: 7  Pain Location 1: Head     Pain Description 1: Aching  Pain Intervention(s) 1: Medication (see MAR)  Activity Tolerance:   Fair  Please refer to the flowsheet for vital signs taken during this treatment.  After treatment:   [ ]   Patient left in no apparent distress sitting up in chair  [X]   Patient left in no apparent distress in bed  [X]   Call bell left within reach  [X]   Nursing notified  [ ]   Caregiver present  [ ]   Bed alarm activated      COMMUNICATION/EDUCATION:   The patient???s plan of care was discussed with: Physical Therapist.    [X]     Home safety education was provided and the patient/caregiver indicated understanding.  [X]      Patient/family have participated as able in goal setting and plan of care.  [X]     Patient/family 2gree to work toward stated goals and plan of care.  [ ]     Patient understands intent and goals of therapy, but is neutral about his/her participation.  [ ]     Patient is unable to participate in goal setting and plan of care.  This patient???s plan of care is appropriate for delegation to OTA.    Thank you for this referral.  Janus Molder Hunsucker, OT  Time Calculation: 24 mins

## 2014-01-21 NOTE — Progress Notes (Signed)
Bedside shift change report given to Dawn RN (oncoming nurse) by Teresa RN (offgoing nurse). Report included the following information SBAR.

## 2014-01-21 NOTE — Progress Notes (Signed)
Problem: Mobility Impaired (Adult and Pediatric)  Goal: *Acute Goals and Plan of Care (Insert Text)  Physical Therapy Goals  Initiated 01/21/2014   1. Patient will transfer from bed to chair and chair to bed with modified independence using the least restrictive device within 3 day(s).  2. Patient will perform sit to stand with modified independence within 3 day(s).  3. Patient will ambulate with modified independence for 100 feet with the least restrictive device within 3 day(s).   PHYSICAL THERAPY EVALUATION INCLUDING A FUNCTIONAL MEASURE  Patient: Dale Weaver (63 y.o. male)  Date: 01/21/2014  Primary Diagnosis: CVA (cerebral infarction) [facility])        Precautions: Fall         ASSESSMENT :  Based on the objective data described below, the patient presents with balance and strength deficits, more pronounced on R side. He has UE tremors from concurrent Parkinson's and history of low back pain with sciatica; reported R LE occasionally giving way causing him to fall once per month. Patient was supervision with bed mobility and able to perform sit to stand without assistive device at [facility] for safety. He ambulated approx 15 ft without assistive device and 15 ft with assistive device; R foot did not clear floor causing him to drag due to R LE weakness and proprioception deficits. He presented with improved gait pattern and increased confidence using rolling walker x 15 ft. Patient c/o headache and vision problems in his R eye throughout evaluation.     Patient is limited by R LE weakness as well as preexisting Parkinson's and low back dysfunction with sciatica. He demonstrated good safety awareness, however, and no unsteadiness or loss of balance observed. He owns a rolling walker at home, which is appropriate for him at this time. Patient would benefit from home health PT upon discharge to improve functional mobility and strength.     Patient will benefit from skilled intervention to address the above impairments.   Patient???s rehabilitation potential is considered to be Fair  Factors which may influence rehabilitation potential include:   [ ]          None noted  [ ]          Mental ability/status  [X]          Medical condition  [ ]          Home/family situation and support systems  [ ]          Safety awareness  [ ]          Pain tolerance/management  [ ]          Other:        PLAN :  Recommendations and Planned Interventions:  [ ]            Bed Mobility Training             [ ]     Neuromuscular Re-Education  [ ]            Transfer Training                   [ ]     Orthotic/Prosthetic Training  [X]            Gait Training                         [ ]     Modalities  [X]            Therapeutic Exercises           [ ]   Edema Management/Control  [X]            Therapeutic Activities            [ ]     Patient and Family Training/Education  [ ]            Other (comment):    Frequency/Duration: Patient will be followed by physical therapy 1-2 times per day/4-7 days per week to address goals.  Discharge Recommendations: Home Health  Further Equipment Recommendations for Discharge: None (owns rolling walker)       SUBJECTIVE:   Patient stated ???I have roommates who can help me with cooking and house work.???      OBJECTIVE DATA SUMMARY:       Past Medical History   Diagnosis Date   ??? Back pain     ??? Hiatal hernia     ??? Heart failure (HCC)         CHF   ??? CAD (coronary artery disease)         cardiac blockage     Past Surgical History   Procedure Laterality Date   ??? Hx orthopaedic           back surgery x3, last June 2010   ??? Hx appendectomy       ??? Hx cholecystectomy       ??? Hx hernia repair       ??? Pr cardiac surg procedure unlist           cardiac cath August 2009   ??? Pr cardiac surg procedure unlist           [facility] 1 [facility]   ??? Pr cardiac surg procedure unlist           insertion of loop recorder-no MRI     Prior Level of Function/Home Situation: Lives in 1 level home with no STE (ramp) with roommate(s) who assist him with  meal preparation and occasionally with self care. He owns rolling walker, straight cane, grab bars, shower chair, elevated commode. Patient was previously walking without assistive device around his home and using a straight cane for community ambulation. He reports falls at frequency of once per month due to low back pain and sciatica stating that his R LE will give out on him. Patient has tremors more apparent in UE due to Parkinson's; states his symptoms respond to Dopamine medications.  Home Situation  Home Environment: Private residence  One/Two Story Residence: One story  Living Alone: No  Support Systems: Friends \\ neighbors  Patient Expects to be Discharged to:: Private residence  Current DME Used/Available at Home: None  Critical Behavior:  Neurologic State: Alert  Orientation Level: Oriented X4  Cognition: Appropriate decision making, Appropriate for age attention/concentration, Appropriate safety awareness  Strength:    Strength: Generally decreased, functional (R LE significantly weaker)  Tone & Sensation:   Tone: Normal  Sensation: Impaired  Range Of Motion:  AROM: Generally decreased, functional  Coordination:  Coordination: Generally decreased, functional    Functional Mobility:  Bed Mobility:  Rolling: Additional time  Supine to Sit: Additional time  Sit to Supine: Additional time  Scooting: Supervision  Transfers:  Sit to Stand: Additional time;CGA  Stand to Sit: CGA;Verbal cues  Balance:   Sitting: Intact  Standing: Impaired  Standing - Static: Good  Standing - Dynamic : Fair  Ambulation/Gait Training:  Distance (ft): 30 Feet (ft)  Assistive Device: Walker, rolling  Ambulation - Level of Assistance: CGA  Gait  Description (WDL): Exceptions to WDL  Gait Abnormalities: Decreased step clearance;Foot drop;Shuffling gait (dragging R foot)  Base of Support: Narrowed  Speed/Cadence: Shuffled;Slow    Functional Measure:  Tinetti test:      Sitting Balance: 1  Arises: 1  Attempts to Rise: 2  Immediate  Standing Balance: 2  Standing Balance: 2  Nudged: 2  Eyes Closed: 0  Turn 360 Degrees - Continuous/Discontinuous: 0  Turn 360 Degrees - Steady/Unsteady: 1  Sitting Down: 1  Balance Score: 12  Indication of Gait: 0  R Step Length/Height: 1  L Step Length/Height: 1  R Foot Clearance: 0  L Foot Clearance: 1  Step Symmetry: 0  Step Continuity: 1  Path: 2  Trunk: 1  Walking Time: 1  Gait Score: 8  Total Score: 20        Tinetti Test and G-code impairment scale:  Percentage of Impairment CH    0%    CI    1-19% CJ    20-39% CK    40-59% CL    60-79% CM    80-99% CN     100%   Tinetti  Score 0-28 28 23-27 17-22 12-16 6-11 1-5 0       Tinetti Tool Score Risk of Falls  <19 = High Fall Risk  19-24 = Moderate Fall Risk  25-28 = Low Fall Risk  Tinetti ME. Performance-Oriented Assessment of Mobility Problems in Elderly Patients. [place]; [id number]. (Scoring Description: PT Bulletin Feb. 10, 1993)    Older adults: Letta Moynahan et al, 2009; n = [place] elderly evaluated with ABC, POMA, ADL, and IADL)  ?? Mean POMA score for males aged 81-79 years = 26.21(3.40)  ?? Mean POMA score for females age 16-79 years = 25.16(4.30)  ?? Mean POMA score for males over 80 years = 23.29(6.02)  ?? Mean POMA score for females over 80 years = 17.20(8.32)        Therapeutic Exercises:  LAQ  Heel slides  Supine marching  UE diagonals with yellow band (dispensed)  Hip adduction squeezes      Pain:  Pain Scale 1: Numeric (0 - 10)  Pain Intensity 1: 9  Pain Location 1: Head     Pain Description 1: Aching;Constant  Pain Intervention(s) 1: Medication (see MAR)  Activity Tolerance:   Good tolerance overall; difficulty seeing out of R eye and R foot dragging during ambulation  Please refer to the flowsheet for vital signs taken during this treatment.  After treatment:   [X]          Patient left in no apparent distress sitting up in chair  [X]          Patient left in no apparent distress in bed  [X]          Call bell left within reach  [X]          Nursing notified   [ ]          Caregiver present  [ ]          Bed alarm activated      COMMUNICATION/EDUCATION:   The patient???s plan of care was discussed with: Occupational Therapist, Registered Nurse and Education officer, museum.  [X]          Fall prevention education was provided and the patient/caregiver indicated understanding.  [X]          Patient/family have participated as able in goal setting and plan of care.  [X]          Patient/family  agree to work toward stated goals and plan of care.  [ ]          Patient understands intent and goals of therapy, but is neutral about his/her participation.  [ ]          Patient is unable to participate in goal setting and plan of care.    Thank you for this referral.  Brock Ra, PT   Time Calculation: 42 mins

## 2014-01-21 NOTE — Progress Notes (Signed)
Speech Pathology:  Consult received, chart reviewed and discussed with RN via telephone.  Per chart patient reported difficulty swallowing at home.  RN, who completed STAND, reports no difficulty swallowing, diet ordered and patient tolerating regular diet without difficulty or complaint.  Per nurse, patient without speech or language deficits as well.  Will complete order at this time as patient without presentation of speech/language/swallowing difficulties. Please re-consult is change occurs.  Thank you.    Moshe Salisbury, [place]

## 2014-01-21 NOTE — Progress Notes (Signed)
Initial Neurology Evaluation - Preliminary Note    Patient seen, chart reviewed and neurology consultation report dictated.      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  Blood for autoimmune w/u  Propranolol 40mg  bid.  EEG  Carotid doppler  Echocardiogram.    Will follow patient's course along with you as necessary. Thank you for the opportunity to participate in the care of your patient.    Xayla Puzio C Niquan Charnley, MD  01/21/2014

## 2014-01-21 NOTE — Procedures (Signed)
[facility]  *** FINAL REPORT ***    Name: Dale Weaver, Dale Weaver  MRN: [id number]    Inpatient  DOB: 1950/12/21  HIS Order #: [id number]  [place] Visit #: [id number]  Date: 21 Jan 2014    TYPE OF TEST: Cerebrovascular Duplex    REASON FOR TEST  Cerebrovascular accident    Right Carotid:-             Proximal               Mid                 Distal  cm/s  Systolic  Diastolic  Systolic  Diastolic  Systolic  Diastolic  CCA:    347.4      15.0                            88.0      14.0  Bulb:  ECA:     73.0      10.0  ICA:     69.0      15.0       73.0      20.0       65.0      20.0  ICA/CCA:  0.8       1.1    ICA Stenosis: <50%    Right Vertebral:-  Finding: Antegrade  Sys:       48.0  Dia:       12.0    Right Subclavian: Normal    Left Carotid:-            Proximal                Mid                 Distal  cm/s  Systolic  Diastolic  Systolic  Diastolic  Systolic  Diastolic  CCA:    259.5      27.0                            85.0      16.0  Bulb:  ECA:    118.0       6.0  ICA:     87.0      12.0       73.0      25.0       67.0      24.0  ICA/CCA:  1.0       0.8    ICA Stenosis: <50%    Left Vertebral:-  Finding: Antegrade  Sys:       44.0  Dia:        9.0    Left Subclavian: Normal    INTERPRETATION/FINDINGS  1. Bilateral <50% stenosis of the internal carotid arteries.  2. No significant stenosis in the external carotid arteries  bilaterally.  3. Antegrade flow in both vertebral arteries.  4. Normal flow in both subclavian arteries.  Plaque Morphology:  1. Homogeneous plaque in the bulb and right ICA.  2. Homogeneous plaque in the bulb and left ICA.    ADDITIONAL COMMENTS    I have personally reviewed the data relevant to the interpretation of  this  study.    TECHNOLOGIST: Alm Bustard, RVS  Signed: 01/21/2014 02:19 PM    PHYSICIAN: Arvilla Market. Vonzella Nipple, MD  Signed: 01/21/2014 03:07 PM

## 2014-01-21 NOTE — Discharge Summary (Signed)
[place]    Physician Discharge Summary     Patient ID:  Dale Weaver  [id number]  63 y.o.  1950/12/21    Admit date: 01/20/2014    Discharge date and time: 01/21/2014    Admission Diagnoses: CVA (cerebral infarction) [facility])    Discharge Diagnoses:      CVA  Parkinson's disease  Headache   Chronic pain syndrome  CAD    Hospital Course:     Headache and right vision loss and Right sided weakness likely CVA: POA  Headache resolved, vision improving. Seen by PT/OT recommended HH, pt declined  Neurology -low probability for GCA, recommended d/c steroids.   Head and neck CTA, no limited flow or significant stenosis. echocardiogram, no source of possible embolism  Continue ASA/plavix  PT/OT eval much appreciated  Neurology consult appreciated  Continue rest of home medication      [facility]    Consults: Neurology    Discharge Exam:  Gen:  No acute distress  Resp:  No accessory muscle use, clear breath sounds without wheezes rales or rhonchi  Card:  No murmurs, normal S1, S2 without thrills, bruits or peripheral edema  Abd:  Soft, non-tender, non-distended, normoactive bowel sounds are present, no palpable organomegaly  Skin:  No rashes or ulcers, skin turgor is good  Neuro:  Right sided power 4/5, left side 5/5    Disposition: home    Patient Instructions:     Activity: Activity as tolerated  Diet: Cardiac Diet  Wound Care: None needed    Follow-up with PCP/neurology/opthalmogy in 1 week.  Follow-up tests/labs none    Approximate time spent in patient care on day of discharge: 35 min    Signed:  Jac Canavan, MD  01/23/2014  1:45 PM

## 2014-01-21 NOTE — Progress Notes (Addendum)
8675- Morning labs obtained 1 PST and 1 Lavender sent to lab for processing  Patient rested this shift  Medicated per MD order for pain  Patient assisted to bed side commode this shift

## 2014-01-21 NOTE — Progress Notes (Signed)
[facility]ication Management [facility]      Patient is enrolled in [facility] Med Management Program, per patient's consent.   Will follow up with patient at or after discharge to go over any new meds, and will schedule first home visit with patient if discharged to home.  Pam Wilkinson, RPh        [facility] - Any patient that enrolls in our program will have a [facility] pharmacist counsel them on their discharge medications prior to leaving the hospital. [facility] will conduct 3 in-home visits post-discharge with the patient to gather a home med-rec, and focus on medication compliance. [facility] does have a closed-door pharmacy, and will determine at time of discharge whether the patient would like us to fill their discharge meds, with free home-delivery.

## 2014-01-21 NOTE — Procedures (Signed)
[facility]  *** FINAL REPORT ***    Name: Dale Weaver, Dale Weaver  MRN: [id number]    Inpatient  DOB: [birth year]  HIS Order #: [id number]  [facility] Visit #: [id number]  Date: 21 Jan 2014    TYPE OF TEST: Cerebrovascular Duplex    REASON FOR TEST  Cerebrovascular accident    Right Carotid:-             Proximal               Mid                 Distal  cm/s  Systolic  Diastolic  Systolic  Diastolic  Systolic  Diastolic  CCA:    188.4      15.0                            88.0      14.0  Bulb:  ECA:     73.0      10.0  ICA:     69.0      15.0       73.0      20.0       65.0      20.0  ICA/CCA:  0.8       1.1    ICA Stenosis: <50%    Right Vertebral:-  Finding: Antegrade  Sys:       48.0  Dia:       12.0    Right Subclavian: Normal    Left Carotid:-            Proximal                Mid                 Distal  cm/s  Systolic  Diastolic  Systolic  Diastolic  Systolic  Diastolic  CCA:    166.0      27.0                            85.0      16.0  Bulb:  ECA:    118.0       6.0  ICA:     87.0      12.0       73.0      25.0       67.0      24.0  ICA/CCA:  1.0       0.8    ICA Stenosis: <50%    Left Vertebral:-  Finding: Antegrade  Sys:       44.0  Dia:        9.0    Left Subclavian: Normal    INTERPRETATION/FINDINGS  1. Bilateral <50% stenosis of the internal carotid arteries.  2. No significant stenosis in the external carotid arteries  bilaterally.  3. Antegrade flow in both vertebral arteries.  4. Normal flow in both subclavian arteries.  Plaque Morphology:  1. Homogeneous plaque in the bulb and right ICA.  2. Homogeneous plaque in the bulb and left ICA.    ADDITIONAL COMMENTS    I have personally reviewed the data relevant to the interpretation of  this  study.    TECHNOLOGIST: Alm Bustard, RVS  Signed: 01/21/2014 02:19 PM    PHYSICIAN: Arvilla Market. Vonzella Nipple, MD  Signed: 01/21/2014 03:07 PM

## 2014-01-22 MED ORDER — ENOXAPARIN 40 MG/0.4 ML SUB-Q SYRINGE
40 mg/0.4 mL | SUBCUTANEOUS | Status: DC
Start: 2014-01-22 — End: 2014-01-22
  Administered 2014-01-22: 14:00:00 via SUBCUTANEOUS

## 2014-01-22 MED ORDER — ASPIRIN 81 MG TAB, DELAYED RELEASE
81 mg | ORAL_TABLET | Freq: Every day | ORAL | Status: DC
Start: 2014-01-22 — End: 2014-12-03

## 2014-01-22 MED ORDER — CLOPIDOGREL 75 MG TAB
75 mg | ORAL_TABLET | Freq: Every day | ORAL | Status: DC
Start: 2014-01-22 — End: 2014-12-01

## 2014-01-22 MED ORDER — PROPRANOLOL 10 MG TAB
10 mg | ORAL_TABLET | Freq: Two times a day (BID) | ORAL | Status: DC
Start: 2014-01-22 — End: 2014-12-01

## 2014-01-22 MED ADMIN — sodium chloride (NS) flush 5-10 mL: INTRAVENOUS | @ 09:00:00 | NDC 87701099893

## 2014-01-22 MED ADMIN — carbidopa-levodopa (SINEMET) 10-100 mg per tablet 1 Tab: ORAL | @ 13:00:00 | NDC 63739004610

## 2014-01-22 MED ADMIN — atorvastatin (LIPITOR) tablet 40 mg: ORAL | @ 01:00:00 | NDC 51079021001

## 2014-01-22 MED ADMIN — sodium chloride (NS) flush 5-10 mL: INTRAVENOUS | @ 01:00:00 | NDC 87701099893

## 2014-01-22 MED ADMIN — morphine injection 2 mg: INTRAVENOUS | @ 06:00:00 | NDC 00409189001

## 2014-01-22 MED ADMIN — aspirin chewable tablet 81 mg: ORAL | @ 13:00:00 | NDC 63739043401

## 2014-01-22 MED ADMIN — morphine injection 2 mg: INTRAVENOUS | @ 13:00:00 | NDC 00409189001

## 2014-01-22 MED ADMIN — morphine injection 2 mg: INTRAVENOUS | @ 02:00:00 | NDC 00409189001

## 2014-01-22 MED FILL — MORPHINE 2 MG/ML INJECTION: 2 mg/mL | INTRAMUSCULAR | Qty: 1

## 2014-01-22 MED FILL — BD POSIFLUSH NORMAL SALINE 0.9 % INJECTION SYRINGE: INTRAMUSCULAR | Qty: 10

## 2014-01-22 MED FILL — CARBIDOPA-LEVODOPA 10 MG-100 MG TAB: 10-100 mg | ORAL | Qty: 1

## 2014-01-22 MED FILL — LOVENOX 40 MG/0.4 ML SUBCUTANEOUS SYRINGE: 40 mg/0.4 mL | SUBCUTANEOUS | Qty: 0.4

## 2014-01-22 MED FILL — CHILDREN'S ASPIRIN 81 MG CHEWABLE TABLET: 81 mg | ORAL | Qty: 1

## 2014-01-22 MED FILL — ATORVASTATIN 40 MG TAB: 40 mg | ORAL | Qty: 1

## 2014-01-22 NOTE — Progress Notes (Addendum)
.  Stroke Education provided to patient and the following topics were discussed    1. Patients personal risk factors for stroke are prior stroke    2. Warning signs of Stroke:        * Sudden numbness or weakness of the face, arm or leg, especially on one side of          The body            * Sudden confusion, trouble speaking or understanding        * Sudden trouble seeing in one or both eyes        * Sudden trouble walking, dizziness, loss of balance or coordination        * Sudden severe headache with no known cause      3. Importance of activation Emergency Medical Services ( 9-1-1 ) immediately if experience any warning signs of stroke.    4. Be sure and schedule a follow-up appointment with your primary care doctor or any specialists as instructed.     5. You must take medicine every day to treat your risk factors for stroke.  Be sure to take your medicines exactly as your doctor tells you: no more, no less.  Know what your medicines are for , what they do.  Anti-thrombotics /anticoagulants can help prevent strokes.  You are taking the following medicine(s)  Asprin,plavix, and inderal.     6.  Smoking and second-hand smoke greatly increase your risk of stroke, cardiovascular disease and death. Smoking history never    7. Information provided was [facility] Stroke Education Binder    8. Documentation of teaching completed in Patient Education Activity and on Care Plan with teaching response noted?  yes      1200  patient discharge home.patient understand discharge instruction and follow-up appointment.patient received 3 prescription medicine and review with him prescription and home medicine.patient verbally understand her medication instruction with no further question.iv line removed.patient awake alert with steady gait.patint has all his belonging with him.patient wheel down to parking lot.patient left home with family member.

## 2014-01-22 NOTE — Progress Notes (Signed)
Patient rested this shift  Medicated for pain per MD order. Patient up to bathroom with assistance this shift  No S/S of acute distress voiced/observed this shift

## 2014-01-22 NOTE — Progress Notes (Signed)
Discharge Reassessment Plan:  Moderate Risk    RRAT Score:  10 - 18     Moderate Risk Care Transition Interventions:  1. Discharge transition plan:  Discharge to admission address with medical follow up.    2. Involved patient/caregiver in assessment, planning, education and implement of intervention. CM followed up with the case regarding discharge for 01/22/14. Pt is in need of follow up with PCP, opthamology and recommended home health services. CM was able to arrange a follow up appt for the pt, discussed case with PCPs nurse and need for follow up with an opthamologist. CM faxed clinicals to the PCP office for further inquiry of case. CM discussed home health services with the pt, pt pleasantly declined, stating that he has a friend in the home that can assist him and that he has DME in the home to further assist him. No further discharge needs at this time. Pt to be transported to home by friend.    3. CM daily patient care huddles/interdisciplinary rounds were completed. Discussed discharge with MD and RN, rounded with IDT.    4. PCP/Specialist appointment within 5 days made prior to discharge.  Date/Time PCP- Dr. Manuella Ghazi- Urgent Care Provider 01/25/14 at 9:00am    5. Facilitated transportation and logistics for follow-up appointments. Friend    6. Facilitated Medication reconciliation ??? Pharmacy.  Date/Time med rec completed by MD at discharge, [facility] to assist post discharge    7. Formal handoff between hospital provider and post-acute provider to transition patient completed.  Clinicals faxed to PCP office([phone]).    8. Handoff to [facility] Nurse Navigator or PCP practice completed. n/a    Care Management Interventions  PCP Verified by CM?: Yes  Palliative Care Consult: No  Advanced Directive and POA/Guardian:  (pt has advanced directive- at [facility]. )  Care Management Consult: Yes  MyChart Signup: No (To be pursued by RN at discharge)  Discharge Durable Medical Equipment: No  Physical Therapy  Consult: Yes  Occupational Therapy Consult: Yes  Speech Therapy Consult: Yes  Current Support Network: Lives with Caregiver  Confirm Follow Up Transport: Friends  Plan discussed with Pt/Family/Caregiver: Yes  Discharge Location  Discharge Placement: Home with outpatient services    Roosvelt Maser, BSW  [phone]

## 2014-01-25 NOTE — Progress Notes (Signed)
Follow-up form the [facility] /PCP Dr. Solon Palm [phone] ext [phone], fax  [phone].  Signed release of information form from Patient to send additional information from hospital stay.  Information sent.     Velvet Managum-Holmes  MSW RN

## 2014-01-26 NOTE — Telephone Encounter (Signed)
Attempted to reach pt for follow up. Unable to reach on 6/8 or 6/9, unable to leave messages.    Marilynn Latino, MSW  [phone]

## 2014-03-12 LAB — CBC WITH AUTOMATED DIFF
ABS. BASOPHILS: 0 10*3/uL (ref 0.0–0.1)
ABS. EOSINOPHILS: 0.1 10*3/uL (ref 0.0–0.4)
ABS. LYMPHOCYTES: 1.3 10*3/uL (ref 0.8–3.5)
ABS. MONOCYTES: 0.6 10*3/uL (ref 0.0–1.0)
ABS. NEUTROPHILS: 3.8 10*3/uL (ref 1.8–8.0)
BASOPHILS: 0 % (ref 0–1)
EOSINOPHILS: 1 % (ref 0–7)
HCT: 46.6 % (ref 36.6–50.3)
HGB: 16.4 g/dL (ref 12.1–17.0)
LYMPHOCYTES: 22 % (ref 12–49)
MCH: 34.4 PG — ABNORMAL HIGH (ref 26.0–34.0)
MCHC: 35.2 g/dL (ref 30.0–36.5)
MCV: 97.7 FL (ref 80.0–99.0)
MONOCYTES: 10 % (ref 5–13)
NEUTROPHILS: 67 % (ref 32–75)
PLATELET: 133 10*3/uL — ABNORMAL LOW (ref 150–400)
RBC: 4.77 M/uL (ref 4.10–5.70)
RDW: 13.5 % (ref 11.5–14.5)
WBC: 5.7 10*3/uL (ref 4.1–11.1)

## 2014-03-12 LAB — METABOLIC PANEL, COMPREHENSIVE
A-G Ratio: 1.3 (ref 1.1–2.2)
ALT (SGPT): 22 U/L (ref 12–78)
AST (SGOT): 16 U/L (ref 15–37)
Albumin: 3.4 g/dL — ABNORMAL LOW (ref 3.5–5.0)
Alk. phosphatase: 53 U/L (ref 45–117)
Anion gap: 9 mmol/L (ref 5–15)
BUN/Creatinine ratio: 20 (ref 12–20)
BUN: 16 MG/DL (ref 6–20)
Bilirubin, total: 1.1 MG/DL — ABNORMAL HIGH (ref 0.2–1.0)
CO2: 24 mmol/L (ref 21–32)
Calcium: 8.4 MG/DL — ABNORMAL LOW (ref 8.5–10.1)
Chloride: 108 mmol/L (ref 97–108)
Creatinine: 0.79 MG/DL (ref 0.45–1.15)
GFR est AA: >60 mL/min/{1.73_m2} (ref >60)
GFR est non-AA: >60 mL/min/{1.73_m2} (ref >60)
Globulin: 2.6 g/dL (ref 2.0–4.0)
Glucose: 103 mg/dL — ABNORMAL HIGH (ref 65–100)
Potassium: 3.3 mmol/L — ABNORMAL LOW (ref 3.5–5.1)
Protein, total: 6 g/dL — ABNORMAL LOW (ref 6.4–8.2)
Sodium: 141 mmol/L (ref 136–145)

## 2014-03-12 LAB — NT-PRO BNP: NT pro-BNP: 95 PG/ML (ref 0–125)

## 2014-03-12 LAB — CK W/ CKMB & INDEX
CK - MB: 1.3 NG/ML (ref 0.5–3.6)
CK-MB Index: 1.9 (ref 0.0–2.5)
CK: 69 U/L (ref 39–308)

## 2014-03-12 LAB — TROPONIN I: Troponin-I, Qt.: <0.04 ng/mL (ref <0.05)

## 2014-03-12 MED ORDER — SODIUM CHLORIDE 0.9 % IJ SYRG
Freq: Three times a day (TID) | INTRAMUSCULAR | Status: DC
Start: 2014-03-12 — End: 2014-03-12

## 2014-03-12 MED ORDER — MECLIZINE 12.5 MG TAB
12.5 mg | ORAL | Status: AC
Start: 2014-03-12 — End: 2014-03-12
  Administered 2014-03-12: via ORAL

## 2014-03-12 MED ORDER — SODIUM CHLORIDE 0.9 % IJ SYRG
INTRAMUSCULAR | Status: DC | PRN
Start: 2014-03-12 — End: 2014-03-12

## 2014-03-12 MED ORDER — POTASSIUM CHLORIDE SR 10 MEQ TAB
10 mEq | ORAL | Status: AC
Start: 2014-03-12 — End: 2014-03-12
  Administered 2014-03-12: 23:00:00 via ORAL

## 2014-03-12 MED ORDER — MECLIZINE 25 MG TAB
25 mg | ORAL_TABLET | Freq: Three times a day (TID) | ORAL | Status: DC | PRN
Start: 2014-03-12 — End: 2014-12-01

## 2014-03-12 MED ADMIN — sodium chloride 0.9 % bolus infusion 500 mL: INTRAVENOUS | @ 23:00:00 | NDC 00409798309

## 2014-03-12 MED FILL — POTASSIUM CHLORIDE SR 10 MEQ TAB: 10 mEq | ORAL | Qty: 1

## 2014-03-12 MED FILL — SODIUM CHLORIDE 0.9 % IV: INTRAVENOUS | Qty: 500

## 2014-03-12 MED FILL — MECLIZINE 12.5 MG TAB: 12.5 mg | ORAL | Qty: 2

## 2014-03-12 NOTE — ED Provider Notes (Addendum)
Patient is a 63 y.o. male presenting with syncope. The history is provided by the patient. No language interpreter was used.   Syncope   This is a new problem. The current episode started 6 to 12 hours ago. The problem has been resolved. There was no loss of consciousness. The problem is associated with standing up and normal activity. Associated symptoms include visual change and light-headedness. Pertinent negatives include no chest pain, no palpitations, no fever, no malaise/fatigue, no abdominal pain, no bowel incontinence, no bladder incontinence, no congestion, no headaches, no back pain, no focal weakness, no seizures, no slurred speech, no melena, no anal bleeding and no head injury. He has tried nothing for the symptoms. His past medical history is significant for TIA and CAD.      Pt had several episodes of positional near syncope today, with feeling of warmness and narrowing of visual field. Pt normally seen at [facility]. He has an indwelling event monitor for which he states he is seen every 6 months.     Past Medical History   Diagnosis Date   ??? Back pain    ??? Heart failure (HCC)      CHF   ??? CAD (coronary artery disease)      cardiac blockage   ??? Psychiatric disorder      anxiety   ??? Hiatal hernia         Past Surgical History   Procedure Laterality Date   ??? Hx orthopaedic       back surgery x3, last June 2010   ??? Hx appendectomy     ??? Hx cholecystectomy     ??? Hx hernia repair     ??? Pr cardiac surg procedure unlist       cardiac cath August 2009   ??? Pr cardiac surg procedure unlist       x 1 stent, cardiologist @ [facility]   ??? Pr cardiac surg procedure unlist       insertion of loop recorder-no MRI         History reviewed. No pertinent family history.     History     Social History   ??? Marital Status: WIDOWED     Spouse Name: N/A     Number of Children: N/A   ??? Years of Education: N/A     Occupational History   ??? Not on file.     Social History Main Topics   ??? Smoking status: Never Smoker     ??? Smokeless tobacco: Not on file   ??? Alcohol Use: No   ??? Drug Use: No   ??? Sexual Activity: Not on file     Other Topics Concern   ??? Not on file     Social History Narrative                  ALLERGIES: Seafood; Sulfa (sulfonamide antibiotics); and Zoloft      Review of Systems   Constitutional: Negative for fever and malaise/fatigue.   HENT: Negative for congestion.    Eyes: Positive for visual disturbance.   Cardiovascular: Positive for syncope. Negative for chest pain and palpitations.   Gastrointestinal: Negative for abdominal pain, melena, anal bleeding and bowel incontinence.   Genitourinary: Negative for bladder incontinence.   Musculoskeletal: Negative for back pain.   Neurological: Positive for light-headedness. Negative for focal weakness, seizures and headaches.   All other systems reviewed and are negative.      Filed Vitals:  03/12/14 1630 03/12/14 1645 03/12/14 1715   BP: 147/91  124/77   Pulse: 55  58   Temp: 99 ??F (37.2 ??C)     Resp: 22     Height: 6\' 1"  (1.854 m)     Weight: 99.791 kg (220 lb)     SpO2: 100% 100%             Physical Exam   Constitutional: He is oriented to person, place, and time. He appears well-developed and well-nourished.   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   Right Ear: External ear normal.   Left Ear: External ear normal.   Nose: Nose normal.   Mouth/Throat: Oropharynx is clear and moist. No oropharyngeal exudate.   Eyes: Conjunctivae and EOM are normal. Pupils are equal, round, and reactive to light. Right eye exhibits no discharge. Left eye exhibits no discharge. No scleral icterus.   Neck: Normal range of motion. Neck supple. No JVD present. No tracheal deviation present. No thyromegaly present.   Cardiovascular: Normal rate, regular rhythm and normal heart sounds.    No murmur heard.  Indwelling event monitor L chest   Pulmonary/Chest: Effort normal and breath sounds normal. No stridor. No  respiratory distress. He has no wheezes. He has no rales.   Abdominal: Soft. He exhibits no distension. There is no tenderness. There is no rebound and no guarding.   Musculoskeletal: Normal range of motion. He exhibits no edema or tenderness.   Lymphadenopathy:     He has no cervical adenopathy.   Neurological: He is alert and oriented to person, place, and time. No cranial nerve deficit. He exhibits abnormal muscle tone. Coordination normal.   Skin: Skin is warm and dry. No rash noted. No erythema. No pallor.   Psychiatric: His behavior is normal. Judgment and thought content normal.   Nursing note and vitals reviewed.       MDM  Number of Diagnoses or Management Options  Hypokalemia:   Vertigo:   Diagnosis management comments: Examined pt. Has nystagmus upon sitting up. He has benign positional vertigo      Procedures

## 2014-03-12 NOTE — ED Notes (Signed)
Patient reports he was watering grass earlier today, vision went dark and he passed out.  Patient reports feeling short of breath X 2 days, felt his heart was beating fast prior to syncope.  Patient reports feeling nausea/vomiting all day.  Patient reports he has been feeling faint upon standing throughout the day.

## 2014-03-12 NOTE — ED Notes (Signed)
Discharge instructions and prescriptions reviewed with patient. Medication education provided on (1) scripts. Verbalized understanding. Discharged (ambulatory) accompanied by (friend) with no distress noted.

## 2014-03-12 NOTE — ED Notes (Signed)
Portable chest x-ray complete

## 2014-03-12 NOTE — ED Notes (Signed)
Patient has friend at bedside, she reports patient spends a lot of time outside and tends to get overheated frequently.

## 2014-03-13 LAB — EKG, 12 LEAD, INITIAL
Atrial Rate: 56 {beats}/min
Calculated P Axis: 21 degrees
Calculated R Axis: 8 degrees
Calculated T Axis: 18 degrees
P-R Interval: 152 ms
Q-T Interval: 444 ms
QRS Duration: 92 ms
QTC Calculation (Bezet): 428 ms
Ventricular Rate: 56 {beats}/min

## 2014-06-01 ENCOUNTER — Inpatient Hospital Stay
Admit: 2014-06-01 | Discharge: 2014-06-01 | Disposition: A | Discharge status: Home / Self Care | Payer: Self-pay | Attending: Emergency Medicine

## 2014-06-01 DIAGNOSIS — J189 Pneumonia, unspecified organism: Secondary | ICD-10-CM

## 2014-06-01 LAB — CBC WITH AUTOMATED DIFF
ABS. BASOPHILS: 0 10*3/uL (ref 0.0–0.1)
ABS. EOSINOPHILS: 0 10*3/uL (ref 0.0–0.4)
ABS. LYMPHOCYTES: 0.9 10*3/uL (ref 0.8–3.5)
ABS. MONOCYTES: 0.5 10*3/uL (ref 0.0–1.0)
ABS. NEUTROPHILS: 3.9 10*3/uL (ref 1.8–8.0)
BASOPHILS: 0 % (ref 0–1)
EOSINOPHILS: 1 % (ref 0–7)
HCT: 41.6 % (ref 36.6–50.3)
HGB: 14.5 g/dL (ref 12.1–17.0)
LYMPHOCYTES: 17 % (ref 12–49)
MCH: 34.1 PG — ABNORMAL HIGH (ref 26.0–34.0)
MCHC: 34.9 g/dL (ref 30.0–36.5)
MCV: 97.9 FL (ref 80.0–99.0)
MONOCYTES: 9 % (ref 5–13)
NEUTROPHILS: 73 % (ref 32–75)
PLATELET: 123 10*3/uL — ABNORMAL LOW (ref 150–400)
RBC: 4.25 M/uL (ref 4.10–5.70)
RDW: 12.4 % (ref 11.5–14.5)
WBC: 5.3 10*3/uL (ref 4.1–11.1)

## 2014-06-01 LAB — CK W/ REFLX CKMB: CK: 50 U/L (ref 39–308)

## 2014-06-01 LAB — METABOLIC PANEL, COMPREHENSIVE
A-G Ratio: 1.2 (ref 1.1–2.2)
A-G Ratio: 1.3 (ref 1.1–2.2)
ALT (SGPT): 19 U/L (ref 12–78)
ALT (SGPT): 18 U/L (ref 12–78)
AST (SGOT): 17 U/L (ref 15–37)
AST (SGOT): 11 U/L — ABNORMAL LOW (ref 15–37)
Albumin: 3.6 g/dL (ref 3.5–5.0)
Albumin: 3.3 g/dL — ABNORMAL LOW (ref 3.5–5.0)
Alk. phosphatase: 58 U/L (ref 45–117)
Alk. phosphatase: 54 U/L (ref 45–117)
Anion gap: 8 mmol/L (ref 5–15)
Anion gap: 7 mmol/L (ref 5–15)
BUN/Creatinine ratio: 16 (ref 12–20)
BUN/Creatinine ratio: 16 (ref 12–20)
BUN: 16 MG/DL (ref 6–20)
BUN: 15 MG/DL (ref 6–20)
Bilirubin, total: 0.8 MG/DL (ref 0.2–1.0)
Bilirubin, total: 0.9 MG/DL (ref 0.2–1.0)
CO2: 26 mmol/L (ref 21–32)
CO2: 25 mmol/L (ref 21–32)
Calcium: 8.7 MG/DL (ref 8.5–10.1)
Calcium: 8.3 MG/DL — ABNORMAL LOW (ref 8.5–10.1)
Chloride: 107 mmol/L (ref 97–108)
Chloride: 108 mmol/L (ref 97–108)
Creatinine: 1.02 MG/DL (ref 0.70–1.30)
Creatinine: 0.93 MG/DL (ref 0.70–1.30)
GFR est AA: >60 mL/min/{1.73_m2} (ref >60)
GFR est AA: >60 mL/min/{1.73_m2} (ref >60)
GFR est non-AA: >60 mL/min/{1.73_m2} (ref >60)
GFR est non-AA: >60 mL/min/{1.73_m2} (ref >60)
Globulin: 2.9 g/dL (ref 2.0–4.0)
Globulin: 2.6 g/dL (ref 2.0–4.0)
Glucose: 107 mg/dL — ABNORMAL HIGH (ref 65–100)
Glucose: 108 mg/dL — ABNORMAL HIGH (ref 65–100)
Potassium: 4 mmol/L (ref 3.5–5.1)
Potassium: 3.8 mmol/L (ref 3.5–5.1)
Protein, total: 6.5 g/dL (ref 6.4–8.2)
Protein, total: 5.9 g/dL — ABNORMAL LOW (ref 6.4–8.2)
Sodium: 141 mmol/L (ref 136–145)
Sodium: 140 mmol/L (ref 136–145)

## 2014-06-01 LAB — CK W/ CKMB & INDEX
CK - MB: 1 NG/ML (ref 0.5–3.6)
CK-MB Index: 1.3 (ref 0.0–2.5)
CK: 76 U/L (ref 39–308)

## 2014-06-01 LAB — PROTHROMBIN TIME + INR
INR: 1.1 (ref 0.9–1.1)
Prothrombin time: 10.3 s (ref 9.0–11.1)

## 2014-06-01 LAB — TROPONIN I
Troponin-I, Qt.: <0.04 ng/mL (ref <0.05)
Troponin-I, Qt.: <0.04 ng/mL (ref <0.05)
Troponin-I, Qt.: <0.04 ng/mL (ref <0.05)

## 2014-06-01 LAB — LIPASE: Lipase: 113 U/L (ref 73–393)

## 2014-06-01 LAB — PTT: aPTT: 26.1 s (ref 22.1–32.5)

## 2014-06-01 MED ORDER — HYDROMORPHONE (PF) 1 MG/ML IJ SOLN
1 mg/mL | Freq: Once | INTRAMUSCULAR | Status: AC
Start: 2014-06-01 — End: 2014-06-01
  Administered 2014-06-01: 14:00:00 via INTRAVENOUS

## 2014-06-01 MED ORDER — AZITHROMYCIN 250 MG TAB
250 mg | ORAL | Status: AC
Start: 2014-06-01 — End: 2014-06-06

## 2014-06-01 MED ORDER — SODIUM CHLORIDE 0.9 % IV
INTRAVENOUS | Status: DC
Start: 2014-06-01 — End: 2014-06-01
  Administered 2014-06-01: 14:00:00 via INTRAVENOUS

## 2014-06-01 MED ORDER — ACETAMINOPHEN 325 MG TABLET
325 mg | Freq: Once | ORAL | Status: AC
Start: 2014-06-01 — End: 2014-06-01
  Administered 2014-06-01: 19:00:00 via ORAL

## 2014-06-01 MED ORDER — MORPHINE 2 MG/ML INJECTION
2 mg/mL | INTRAMUSCULAR | Status: AC
Start: 2014-06-01 — End: 2014-06-01
  Administered 2014-06-01: 14:00:00 via INTRAVENOUS

## 2014-06-01 MED ORDER — SODIUM CHLORIDE 0.9 % IJ SYRG
INTRAMUSCULAR | Status: DC | PRN
Start: 2014-06-01 — End: 2014-06-01
  Administered 2014-06-01: 16:00:00 via INTRAVENOUS

## 2014-06-01 MED ORDER — DIAZEPAM 5 MG TAB
5 mg | ORAL | Status: AC
Start: 2014-06-01 — End: 2014-06-01
  Administered 2014-06-01: 16:00:00 via ORAL

## 2014-06-01 MED ORDER — NITROGLYCERIN 2 % TRANSDERMAL OINTMENT
2 % | TRANSDERMAL | Status: AC
Start: 2014-06-01 — End: 2014-06-01
  Administered 2014-06-01: 19:00:00 via TOPICAL

## 2014-06-01 MED ORDER — SODIUM CHLORIDE 0.9 % IJ SYRG
Freq: Three times a day (TID) | INTRAMUSCULAR | Status: DC
Start: 2014-06-01 — End: 2014-06-01
  Administered 2014-06-01: 14:00:00 via INTRAVENOUS

## 2014-06-01 MED ORDER — ONDANSETRON (PF) 4 MG/2 ML INJECTION
4 mg/2 mL | INTRAMUSCULAR | Status: AC
Start: 2014-06-01 — End: 2014-06-01
  Administered 2014-06-01: 14:00:00 via INTRAVENOUS

## 2014-06-01 MED ORDER — NITROGLYCERIN 2 % TRANSDERMAL OINTMENT
2 % | Freq: Two times a day (BID) | TRANSDERMAL | Status: DC
Start: 2014-06-01 — End: 2014-06-01

## 2014-06-01 MED ORDER — OMEPRAZOLE 10 MG CAP, DELAYED RELEASE
10 mg | ORAL_CAPSULE | Freq: Every day | ORAL | Status: AC
Start: 2014-06-01 — End: 2014-06-21

## 2014-06-01 MED ORDER — PANTOPRAZOLE 40 MG IV SOLR
40 mg | INTRAVENOUS | Status: AC
Start: 2014-06-01 — End: 2014-06-01
  Administered 2014-06-01: 14:00:00 via INTRAVENOUS

## 2014-06-01 MED ORDER — SODIUM CHLORIDE 0.9 % IV PIGGY BACK
1 gram | INTRAVENOUS | Status: AC
Start: 2014-06-01 — End: 2014-06-01
  Administered 2014-06-01: 16:00:00 via INTRAVENOUS

## 2014-06-01 MED ORDER — HYDROMORPHONE (PF) 1 MG/ML IJ SOLN
1 mg/mL | Freq: Once | INTRAMUSCULAR | Status: AC
Start: 2014-06-01 — End: 2014-06-01
  Administered 2014-06-01: 16:00:00 via INTRAVENOUS

## 2014-06-01 MED ORDER — ONDANSETRON 4 MG TAB, RAPID DISSOLVE
4 mg | ORAL | Status: DC
Start: 2014-06-01 — End: 2014-06-01

## 2014-06-01 MED ORDER — HYDROCODONE-ACETAMINOPHEN 7.5 MG-325 MG TAB
ORAL_TABLET | Freq: Four times a day (QID) | ORAL | Status: DC | PRN
Start: 2014-06-01 — End: 2014-12-01

## 2014-06-01 MED FILL — DIAZEPAM 5 MG TAB: 5 mg | ORAL | Qty: 1

## 2014-06-01 MED FILL — SODIUM CHLORIDE 0.9 % IV: INTRAVENOUS | Qty: 1000

## 2014-06-01 MED FILL — HYDROMORPHONE (PF) 1 MG/ML IJ SOLN: 1 mg/mL | INTRAMUSCULAR | Qty: 1

## 2014-06-01 MED FILL — MORPHINE 2 MG/ML INJECTION: 2 mg/mL | INTRAMUSCULAR | Qty: 1

## 2014-06-01 MED FILL — MAPAP (ACETAMINOPHEN) 325 MG TABLET: 325 mg | ORAL | Qty: 2

## 2014-06-01 MED FILL — NITRO-BID 2 % TRANSDERMAL OINTMENT: 2 % | TRANSDERMAL | Qty: 1

## 2014-06-01 MED FILL — ONDANSETRON (PF) 4 MG/2 ML INJECTION: 4 mg/2 mL | INTRAMUSCULAR | Qty: 2

## 2014-06-01 MED FILL — ONDANSETRON 4 MG TAB, RAPID DISSOLVE: 4 mg | ORAL | Qty: 1

## 2014-06-01 MED FILL — PROTONIX 40 MG INTRAVENOUS SOLUTION: 40 mg | INTRAVENOUS | Qty: 40

## 2014-06-01 MED FILL — CEFTRIAXONE 1 GRAM SOLUTION FOR INJECTION: 1 gram | INTRAMUSCULAR | Qty: 1

## 2014-06-01 NOTE — ED Notes (Signed)
Pt states pain is still a 6/10 and hurts to take deep breath.  CRT obtained arterial sample for labs

## 2014-06-01 NOTE — ED Notes (Signed)
Patient given copy of dc instructions and three script(s).  Patient verbalized understanding of instructions and script (s).  Patient given a current medication reconciliation form and verbalized understanding of their medications.   Patient verbalized understanding of the importance of discussing medications with  his or her physician or clinic they will be following up with.  Patient alert and oriented and in no acute distress.  Patient discharged home ambulatory with self.

## 2014-06-01 NOTE — ED Notes (Signed)
Pt states that pain is still present in chest, but sharpness has been eliminated.

## 2014-06-01 NOTE — Progress Notes (Signed)
Echocardiogram 2D complete adult completed as ordered.?? Procedure explained. Full report to follow.

## 2014-06-01 NOTE — ED Notes (Signed)
Received report from [facility]

## 2014-06-01 NOTE — ED Notes (Signed)
Pt also reports he has implanted LOOP heart monitor

## 2014-06-01 NOTE — Consults (Signed)
[address] [place] [address]       [id number]          Name: Dale Weaver    07/24/1951  [id number]  06/01/2014 3:59 PM    Admit Date: 06/01/2014  Admit Diagnosis: Chest pain  Primary Care Physician:[facility]  Attending Provider: Jerilynn Birkenhead, MD  Primary [place]  Consulting Cardiologist: Janyth Pupa. Kiandria Clum,MD    CC/REASON FOR CONSULT: CHEST PAIN    Subjective:     Dale Weaver is a 63 y.o. male admitted for prolonged intermittently recurring chest pain. Patient reports waking up at around 8 am yesterday with sharp stabbing lower anterolateral pleuritic-like chest pain which waxed and waned with moderate to severe intensity throughout yesterday, last night and this am. Couldn't find a comfortable position to sleep or sit. Pain was associated with diaphoresis,chills, nausea and vomiting of dark red blood similar to previous GI bleeds in past as he reports he was a heavy alcohol user up until 3 yrs ago when he stopped drinking. Reports chest pain is pleuritic and since onset has radiated around to subscapular area. Pain does not feel like a pressure associated with previous "heart pain" in past. Has hx of PCI/stent 2009 and MI 2012 at which time stent was "open" and small vsls were involved requiring no additional stents nor CABG. Had ILR implanted 2011 and placed on Pradaxa FOR ? PAFib. Was taken off 1 week ago in preparation for lap prosthetectomy. Recalls no prior trauma to left chest. Had EKG-WNL and CXR/CT revealing right atelectasis. Has CAD risk factors dyslipidemia.       Review of Symptoms:    A comprehensive review of systems was negative except for that written in the HPI.    Previous treatment/evaluation includes Percutaneous Coronary Intervention, echocardiogram and exercise treadmill test .  Cardiac risk factors: dyslipidemia, male gender, stress.    Patient Active Problem List   Diagnosis Code   ??? CVA (cerebral infarction) ([facility]) I63.9    ??? Chest pain, pleuritic R07.81   ??? Dyslipidemia E78.5   ??? S/P angioplasty with stent--2008 Z98.89   ??? Status post placement of implantable loop recorder 2013 Z95.818   ??? 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 H/O alcohol abuse--alcohol-free since 3 yrs ago F10.10   ??? 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 Back pain    ??? Heart failure (HCC)      CHF   ??? CAD (coronary artery disease)      cardiac blockage   ??? Psychiatric disorder      anxiety   ??? Hiatal hernia      Past Surgical History   Procedure Laterality Date   ??? Hx orthopaedic       back surgery x3, last June 2010   ??? Hx appendectomy     ??? Hx cholecystectomy     ??? Hx hernia repair     ??? Pr cardiac surg procedure unlist       cardiac cath August 2009   ??? Pr cardiac surg procedure unlist       [facility]   ??? Pr cardiac surg procedure unlist       insertion of loop recorder-no MRI     Current Facility-Administered Medications   Medication Dose Route Frequency   ??? sodium chloride (NS) flush 5-10 mL  5-10 mL IntraVENous Q8H   ??? sodium chloride (  NS) flush 5-10 mL  5-10 mL IntraVENous PRN   ??? 0.9% sodium chloride infusion  150 mL/hr IntraVENous CONTINUOUS   ??? ondansetron (ZOFRAN ODT) tablet 4 mg  4 mg Oral NOW     Current Outpatient Prescriptions   Medication Sig   ??? CARBIDOPA PO Take  by mouth.   ??? meclizine (ANTIVERT) 25 mg tablet Take 1 Tab by mouth three (3) times daily as needed for Dizziness.   ??? propranolol (INDERAL) 10 mg tablet Take 1 Tab by mouth two (2) times a day.   ??? aspirin delayed-release 81 mg tablet Take 1 Tab by mouth daily.   ??? clopidogrel (PLAVIX) 75 mg tablet Take 1 Tab by mouth daily.   ??? mirtazapine (REMERON) 15 mg tablet Take 10 mg by mouth nightly.   ??? carbidopa-levodopa (SINEMET) 10-100 mg per tablet Take 2 Tabs by mouth two (2) times a day.   ??? HYDROcodone-acetaminophen (NORCO) 7.5-325 mg per tablet Take 1 Tab by  mouth every six (6) hours as needed for Pain.   ??? ALPRAZolam (XANAX) 1 mg tablet Take 1 mg by mouth.     ??? GABAPENTIN, BULK, 400 mg by Does Not Apply route three (3) times daily.   ??? FLUOXETINE HCL (PROZAC PO) Take  by mouth.     ??? ARIPIPRAZOLE (ABILIFY PO) Take  by mouth.     ??? aspirin 81 mg chewable tablet Take 81 mg by mouth daily.       Allergies   Allergen Reactions   ??? Seafood [Shellfish Containing Products] Hives   ??? Sulfa (Sulfonamide Antibiotics) Hives   ??? Zoloft [Sertraline] Itching      No family history on file.   History     Social History   ??? Marital Status: WIDOWED     Spouse Name: N/A     Number of Children: N/A   ??? Years of Education: N/A     Social History Main Topics   ??? Smoking status: Never Smoker    ??? Smokeless tobacco: Not on file   ??? Alcohol Use: No   ??? Drug Use: No   ??? Sexual Activity: Not on file     Other Topics Concern   ??? Not on file     Social History Narrative              Objective:      Physical Exam  Filed Vitals:    06/01/14 1120 06/01/14 1200 06/01/14 1300 06/01/14 1400   BP: 146/81 144/81 115/65 113/66   Pulse: 59 55 54 53   Temp: 98.6 ??F (37 ??C) 98.6 ??F (37 ??C) 98.5 ??F (36.9 ??C) 98.2 ??F (36.8 ??C)   Resp: 19 14 25 11    Height:       Weight:       SpO2: 99% 98% 94% 94%       BP 111/59 mmHg   Pulse 56   Temp(Src) 98.2 ??F (36.8 ??C)   Resp 16   Ht 6' 1"  (1.854 m)   Wt 215 lb (97.523 kg)   BMI 28.37 kg/m2   SpO2 92%  General Appearance:  Well developed, well nourished,alert and oriented x 3, and individual in no acute distress.   Ears/Nose/Mouth/Throat:   Hearing grossly normal.         Neck: Supple.   Chest:   Lungs clear to auscultation bilaterally.   Cardiovascular:  Regular rate and rhythm, S1, S2 normal, no murmur.   Abdomen:   Soft, non-tender,  bowel sounds are active.   Extremities: No edema bilaterally.    Skin: Warm and dry.               Telemetry: normal sinus rhythm  ECG: normal EKG, normal sinus rhythm  Echocardiogram: Not done    Data Review:   Labs:     Recent Results (from the past 24 hour(s))   EKG, 12 LEAD, INITIAL    Collection Time: 06/01/14  8:38 AM   Result Value Ref Range    Ventricular Rate 61 BPM    Atrial Rate 61 BPM    P-R Interval 168 ms    QRS Duration 96 ms    Q-T Interval 430 ms    QTC Calculation (Bezet) 432 ms    Calculated P Axis 39 degrees    Calculated R Axis 41 degrees    Calculated T Axis 25 degrees    Diagnosis       Normal sinus rhythm  Normal ECG  When compared with ECG of 12-Mar-2014 16:33,  No significant change was found     METABOLIC PANEL, COMPREHENSIVE    Collection Time: 06/01/14  9:10 AM   Result Value Ref Range    Sodium 141 136 - 145 mmol/L    Potassium 4.0 3.5 - 5.1 mmol/L    Chloride 107 97 - 108 mmol/L    CO2 26 21 - 32 mmol/L    Anion gap 8 5 - 15 mmol/L    Glucose 107 (H) 65 - 100 mg/dL    BUN 16 6 - 20 MG/DL    Creatinine 1.02 0.70 - 1.30 MG/DL    BUN/Creatinine ratio 16 12 - 20      GFR est AA >60 >60 ml/min/1.11m    GFR est non-AA >60 >60 ml/min/1.792m   Calcium 8.7 8.5 - 10.1 MG/DL    Bilirubin, total 0.8 0.2 - 1.0 MG/DL    ALT 19 12 - 78 U/L    AST 17 15 - 37 U/L    Alk. phosphatase 58 45 - 117 U/L    Protein, total 6.5 6.4 - 8.2 g/dL    Albumin 3.6 3.5 - 5.0 g/dL    Globulin 2.9 2.0 - 4.0 g/dL    A-G Ratio 1.2 1.1 - 2.2     CK W/ CKMB & INDEX    Collection Time: 06/01/14  9:10 AM   Result Value Ref Range    CK 76 39 - 308 U/L    CK - MB 1.0 0.5 - 3.6 NG/ML    CK-MB Index 1.3 0.0 - 2.5     TROPONIN I    Collection Time: 06/01/14  9:10 AM   Result Value Ref Range    Troponin-I, Qt. <0.04 <0.05 ng/mL   LIPASE    Collection Time: 06/01/14  9:10 AM   Result Value Ref Range    Lipase 113 73 - 393 U/L   CBC WITH AUTOMATED DIFF    Collection Time: 06/01/14 10:47 AM   Result Value Ref Range    WBC 5.3 4.1 - 11.1 K/uL    RBC 4.25 4.10 - 5.70 M/uL    HGB 14.5 12.1 - 17.0 g/dL    HCT 41.6 36.6 - 50.3 %    MCV 97.9 80.0 - 99.0 FL    MCH 34.1 (H) 26.0 - 34.0 PG    MCHC 34.9 30.0 - 36.5 g/dL    RDW 12.4 11.5 - 14.5 %     PLATELET 123 (  L) 150 - 400 K/uL    NEUTROPHILS 73 32 - 75 %    LYMPHOCYTES 17 12 - 49 %    MONOCYTES 9 5 - 13 %    EOSINOPHILS 1 0 - 7 %    BASOPHILS 0 0 - 1 %    ABS. NEUTROPHILS 3.9 1.8 - 8.0 K/UL    ABS. LYMPHOCYTES 0.9 0.8 - 3.5 K/UL    ABS. MONOCYTES 0.5 0.0 - 1.0 K/UL    ABS. EOSINOPHILS 0.0 0.0 - 0.4 K/UL    ABS. BASOPHILS 0.0 0.0 - 0.1 K/UL   METABOLIC PANEL, COMPREHENSIVE    Collection Time: 06/01/14 10:47 AM   Result Value Ref Range    Sodium 140 136 - 145 mmol/L    Potassium 3.8 3.5 - 5.1 mmol/L    Chloride 108 97 - 108 mmol/L    CO2 25 21 - 32 mmol/L    Anion gap 7 5 - 15 mmol/L    Glucose 108 (H) 65 - 100 mg/dL    BUN 15 6 - 20 MG/DL    Creatinine 0.93 0.70 - 1.30 MG/DL    BUN/Creatinine ratio 16 12 - 20      GFR est AA >60 >60 ml/min/1.22m    GFR est non-AA >60 >60 ml/min/1.783m   Calcium 8.3 (L) 8.5 - 10.1 MG/DL    Bilirubin, total 0.9 0.2 - 1.0 MG/DL    ALT 18 12 - 78 U/L    AST 11 (L) 15 - 37 U/L    Alk. phosphatase 54 45 - 117 U/L    Protein, total 5.9 (L) 6.4 - 8.2 g/dL    Albumin 3.3 (L) 3.5 - 5.0 g/dL    Globulin 2.6 2.0 - 4.0 g/dL    A-G Ratio 1.3 1.1 - 2.2     CK W/ REFLX CKMB    Collection Time: 06/01/14 10:47 AM   Result Value Ref Range    CK 50 39 - 308 U/L   TROPONIN I    Collection Time: 06/01/14 10:47 AM   Result Value Ref Range    Troponin-I, Qt. <0.04 <0.05 ng/mL   PROTHROMBIN TIME + INR    Collection Time: 06/01/14 10:47 AM   Result Value Ref Range    INR 1.1 0.9 - 1.1      Prothrombin time 10.3 9.0 - 11.1 sec   PTT    Collection Time: 06/01/14 10:47 AM   Result Value Ref Range    aPTT 26.1 22.1 - 32.5 sec    aPTT, therapeutic range     58.0 - 77.0 SECS   EKG, 12 LEAD, SUBSEQUENT    Collection Time: 06/01/14 12:19 PM   Result Value Ref Range    Ventricular Rate 51 BPM    Atrial Rate 51 BPM    P-R Interval 172 ms    QRS Duration 88 ms    Q-T Interval 466 ms    QTC Calculation (Bezet) 429 ms    Calculated P Axis 68 degrees    Calculated R Axis 62 degrees     Calculated T Axis 66 degrees    Diagnosis       Sinus bradycardia  Possible Anterior infarct , age undetermined  When compared with ECG of 01-Jun-2014 09:48,  MANUAL COMPARISON REQUIRED, DATA IS UNCONFIRMED     TROPONIN I    Collection Time: 06/01/14 12:28 PM   Result Value Ref Range    Troponin-I, Qt. <0.04 <0.05 ng/mL  No intake or output data in the 24 hours ending 06/01/14 1559     Assessment:     Assessment:       Principal Problem:    Chest pain, pleuritic (06/01/2014)    Active Problems:    Dyslipidemia (06/01/2014)      S/P angioplasty with stent--2008 (06/01/2014)      Status post placement of implantable loop recorder 2013 (06/01/2014)      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 (06/01/2014)      H/O alcohol abuse--alcohol-free since 3 yrs ago (06/01/2014)      Affective personality disorder with depression (06/01/2014)         Plan:     1. Patient presents with pleuritic chest and associated symptoms. Doubt ACS despite prior cardiac hx. ? Early Pleuropericarditis but no friction rub. Suspect viral or bacterial etiology--early pulmonary.Will obtain echo to assess RWMA's. If unremarkable then okay to discharge from cardiac standpoint. May need Lexiscan cardiolite if admitted or as outpatient.  Thank you very much for this referral. We appreciate the opportunity to participate in this patient's care. We will follow along with above stated plan.    Lawerance Bach, MD  RS:WNIO

## 2014-06-01 NOTE — ED Provider Notes (Signed)
HPI Comments: Left chest pain and LUQ pain    Patient is a 63 y.o. male presenting with chest pain. The history is provided by the patient and medical records. No language interpreter was used.   Chest Pain (Angina)   This is a new problem. The current episode started yesterday. The problem occurs daily. The pain is present in the substernal region. The pain is at a severity of 7/10. The quality of the pain is described as sharp and stabbing. Associated symptoms include abdominal pain (luq), back pain, diaphoresis, nausea and vomiting. Pertinent negatives include no claudication, no cough, no dizziness, no exertional chest pressure, no fever, no headaches, no hemoptysis, no irregular heartbeat, no leg pain, no lower extremity edema, no malaise/fatigue, no near-syncope, no numbness, no orthopnea, no palpitations, no PND, no shortness of breath, no sputum production and no weakness. He has tried nothing for the symptoms. Risk factors include hypertension and cardiac disease. His past medical history is significant for HTN.His past medical history does not include cancer, DM, DVT, PE or CHF. Past medical history comments: h/o palp, has a loop monitor placed 3 yrs ago at [facility].        Past Medical History   Diagnosis Date   ??? Back pain    ??? Heart failure (HCC)      CHF   ??? CAD (coronary artery disease)      cardiac blockage   ??? Psychiatric disorder      anxiety   ??? Hiatal hernia         Past Surgical History   Procedure Laterality Date   ??? Hx orthopaedic       back surgery x3, last June 2010   ??? Hx appendectomy     ??? Hx cholecystectomy     ??? Hx hernia repair     ??? Pr cardiac surg procedure unlist       cardiac cath August 2009   ??? Pr cardiac surg procedure unlist       [facility]   ??? Pr cardiac surg procedure unlist       insertion of loop recorder-no MRI         No family history on file.     History     Social History   ??? Marital Status: WIDOWED     Spouse Name: N/A     Number of Children: N/A    ??? Years of Education: N/A     Occupational History   ??? Not on file.     Social History Main Topics   ??? Smoking status: Never Smoker    ??? Smokeless tobacco: Not on file   ??? Alcohol Use: No   ??? Drug Use: No   ??? Sexual Activity: Not on file     Other Topics Concern   ??? Not on file     Social History Narrative                  ALLERGIES: Seafood; Sulfa (sulfonamide antibiotics); and Zoloft      Review of Systems   Constitutional: Positive for diaphoresis. Negative for fever, chills and malaise/fatigue.   HENT: Negative for congestion and rhinorrhea.    Eyes: Negative for visual disturbance.   Respiratory: Negative for cough, hemoptysis, sputum production and shortness of breath.    Cardiovascular: Positive for chest pain. Negative for palpitations, orthopnea, claudication, PND and near-syncope.   Gastrointestinal: Positive for nausea, vomiting and abdominal pain (  luq). Negative for diarrhea.   Genitourinary: Negative for dysuria and difficulty urinating.   Musculoskeletal: Positive for back pain. Negative for arthralgias.   Skin: Negative for rash.   Neurological: Negative for dizziness, weakness, light-headedness, numbness and headaches.       Filed Vitals:    06/01/14 0833   BP: 134/84   Pulse: 67   Temp: 97.5 ??F (36.4 ??C)   Resp: 20   Height: 6\' 1"  (1.854 m)   Weight: 97.523 kg (215 lb)   SpO2: 97%            Physical Exam   Constitutional: He is oriented to person, place, and time. He appears well-developed and well-nourished.   HENT:   Head: Atraumatic.   Eyes: EOM are normal. Pupils are equal, round, and reactive to light.   Cardiovascular: Normal rate, regular rhythm, normal heart sounds and intact distal pulses.    Pulmonary/Chest: Effort normal and breath sounds normal.   Abdominal: Soft.   Musculoskeletal: Normal range of motion.   Neurological: He is alert and oriented to person, place, and time.   Skin: Skin is warm and dry.   Psychiatric: He has a normal mood and affect. His behavior is normal.    Nursing note and vitals reviewed.       MDM  Number of Diagnoses or Management Options  Chest pain, unspecified chest pain type:   H/O gastritis:   Pleurisy:   Right lower lobe pneumonia:   Diagnosis management comments: Acs, gastritis, esophageal tear, pud    ekg - nsr @ 61, nl int, no st changes, repeat ekg after repeat pain- sinus brady, no st changes  No change on repeat ekg x 3 with recurrent sx       Amount and/or Complexity of Data Reviewed  Clinical lab tests: ordered and reviewed  Tests in the radiology section of CPT??: ordered and reviewed    Critical Care  Total time providing critical care: 30-74 minutes      Procedures    Pt with no change in sx after morphine, nitro, better results with dilaudid, but pain returning  [facility]  Case discussed  with [facility] hosp MD and cards md Dr Sampson Goon- in ED to see pt and 2d echo done- no wall abnormalities and trop x 2 after 2 d of pain    Pain less likely cardiac, likely pleurisy vs gastritis. Pt started on antbx for RLL atelectasis      Plan made for [facility] but return here for any worsening    I have discussed with patient their diagnosis, treatment, and follow up plan. The patient agrees to follow up as outlined in discharge paperwork and also to return to the ED with any worsening. Jerilynn Birkenhead, MD

## 2014-06-01 NOTE — ED Notes (Signed)
Echocardiogram being performed at bedside.

## 2014-06-01 NOTE — Progress Notes (Addendum)
Care Management Interventions  PCP Verified by CM?: Yes  Care Management Consult: Yes  Confirm Follow Up Transport: Self  Plan discussed with Pt/Family/Caregiver: Yes  Discharge Location  Discharge Placement: Home    Dale Weaver is a 63yo WM who presented to the ED with cp. Dale Weaver is a pt at the [place] and has [facility] health benefits. Explained that benefits would want him to be at [facility] if admitted and pt agrees. Awaiting test results. Pt says that he has been getting good follow up with primary care and Parkinson's clinic.     Marilynn Latino, MSW  [phone]    1:28 PM  Call placed to [facility] for potential ED to ED transfer. Awaiting physician call for peer to peer.    2:20 PM  [facility]. MD to speak with [facility] hospitalist.

## 2014-06-01 NOTE — ED Notes (Signed)
Pt back from radiology, states that pain has gotten increasingly worst, feels a stabbing sensation in left lower ribs with every inhalation. MD notified.

## 2014-06-01 NOTE — ED Notes (Signed)
Cardiologist at bedside.

## 2014-06-01 NOTE — ED Notes (Signed)
Pt resting in bed quietly, call bell in hand

## 2014-06-01 NOTE — ED Notes (Signed)
ED MD at bedside updating pt on plan of care.

## 2014-06-01 NOTE — ED Notes (Signed)
Attempted iv X 2 By staff.  Unsuccesful

## 2014-06-01 NOTE — ED Notes (Signed)
Pt reports last night he was clammy and dizzy and nauseated.

## 2014-06-02 LAB — EKG, 12 LEAD, SUBSEQUENT
Atrial Rate: 51 {beats}/min
Calculated P Axis: 68 degrees
Calculated R Axis: 62 degrees
Calculated T Axis: 66 degrees
P-R Interval: 172 ms
Q-T Interval: 466 ms
QRS Duration: 88 ms
QTC Calculation (Bezet): 429 ms
Ventricular Rate: 51 {beats}/min

## 2014-06-02 LAB — EKG, 12 LEAD, INITIAL
Atrial Rate: 52 {beats}/min
Atrial Rate: 61 {beats}/min
Calculated P Axis: 33 degrees
Calculated P Axis: 39 degrees
Calculated R Axis: 19 degrees
Calculated R Axis: 41 degrees
Calculated T Axis: 16 degrees
Calculated T Axis: 25 degrees
Diagnosis: NORMAL
P-R Interval: 168 ms
P-R Interval: 168 ms
Q-T Interval: 430 ms
Q-T Interval: 458 ms
QRS Duration: 96 ms
QRS Duration: 96 ms
QTC Calculation (Bezet): 425 ms
QTC Calculation (Bezet): 432 ms
Ventricular Rate: 52 {beats}/min
Ventricular Rate: 61 {beats}/min

## 2014-06-06 LAB — CULTURE, BLOOD: Culture result:: NO GROWTH

## 2014-06-07 ENCOUNTER — Inpatient Hospital Stay
Admit: 2014-06-07 | Discharge: 2014-06-08 | Disposition: A | Discharge status: Home / Self Care | Attending: Emergency Medicine

## 2014-06-07 DIAGNOSIS — R091 Pleurisy: Secondary | ICD-10-CM

## 2014-06-07 LAB — D-DIMER, QUANTITATIVE: D-Dimer, Quant: 0.17 mg/L FEU (ref 0.00–0.65)

## 2014-06-07 LAB — METABOLIC PANEL, COMPREHENSIVE
A-G Ratio: 1.4 (ref 1.1–2.2)
ALT (SGPT): 20 U/L (ref 12–78)
AST (SGOT): 9 U/L — ABNORMAL LOW (ref 15–37)
Albumin: 3.5 g/dL (ref 3.5–5.0)
Alk. phosphatase: 59 U/L (ref 45–117)
Anion gap: 7 mmol/L (ref 5–15)
BUN/Creatinine ratio: 19 (ref 12–20)
BUN: 18 MG/DL (ref 6–20)
Bilirubin, total: 0.7 MG/DL (ref 0.2–1.0)
CO2: 25 mmol/L (ref 21–32)
Calcium: 8.5 MG/DL (ref 8.5–10.1)
Chloride: 110 mmol/L — ABNORMAL HIGH (ref 97–108)
Creatinine: 0.95 MG/DL (ref 0.70–1.30)
GFR est AA: >60 mL/min/{1.73_m2} (ref >60)
GFR est non-AA: >60 mL/min/{1.73_m2} (ref >60)
Globulin: 2.5 g/dL (ref 2.0–4.0)
Glucose: 95 mg/dL (ref 65–100)
Potassium: 3.7 mmol/L (ref 3.5–5.1)
Protein, total: 6 g/dL — ABNORMAL LOW (ref 6.4–8.2)
Sodium: 142 mmol/L (ref 136–145)

## 2014-06-07 LAB — CBC WITH AUTOMATED DIFF
ABS. BASOPHILS: 0 10*3/uL (ref 0.0–0.1)
ABS. EOSINOPHILS: 0.1 10*3/uL (ref 0.0–0.4)
ABS. LYMPHOCYTES: 1.3 10*3/uL (ref 0.8–3.5)
ABS. MONOCYTES: 0.4 10*3/uL (ref 0.0–1.0)
ABS. NEUTROPHILS: 3 10*3/uL (ref 1.8–8.0)
BASOPHILS: 0 % (ref 0–1)
EOSINOPHILS: 2 % (ref 0–7)
HCT: 42.9 % (ref 36.6–50.3)
HGB: 14.6 g/dL (ref 12.1–17.0)
LYMPHOCYTES: 27 % (ref 12–49)
MCH: 33.2 PG (ref 26.0–34.0)
MCHC: 34 g/dL (ref 30.0–36.5)
MCV: 97.5 FL (ref 80.0–99.0)
MONOCYTES: 8 % (ref 5–13)
NEUTROPHILS: 63 % (ref 32–75)
PLATELET: 119 10*3/uL — ABNORMAL LOW (ref 150–400)
RBC: 4.4 M/uL (ref 4.10–5.70)
RDW: 12.9 % (ref 11.5–14.5)
WBC: 4.8 10*3/uL (ref 4.1–11.1)

## 2014-06-07 LAB — CK W/ CKMB & INDEX
CK - MB: 2 NG/ML (ref 0.5–3.6)
CK-MB Index: 2.1 (ref 0–2.5)
CK: 94 U/L (ref 39–308)

## 2014-06-07 LAB — TROPONIN I: Troponin-I, Qt.: <0.04 ng/mL (ref <0.05)

## 2014-06-07 LAB — D DIMER: D-dimer: 0.17 mg/L FEU (ref 0.00–0.65)

## 2014-06-07 MED ORDER — NAPROXEN 500 MG TAB
500 mg | ORAL_TABLET | Freq: Two times a day (BID) | ORAL | Status: DC | PRN
Start: 2014-06-07 — End: 2014-12-01

## 2014-06-07 NOTE — ED Notes (Signed)
X-ray called - will be coming to get him

## 2014-06-07 NOTE — ED Notes (Signed)
Patient discharged by ED MD. Patient has had opportunity to ask questions. Discharge instructions were reinforced by nurse and patient has verbalized understanding.  Patient assisted with Wheel chair to ED waiting room to wait for a taxi, discharge instructions and prescriptions in hand.

## 2014-06-07 NOTE — ED Provider Notes (Signed)
HPI Comments: Dale Weaver is a 63 y.o. male with Hx of CHF and CAD who presents via EMS to [facility] ED with cc of progressively worsening, non-radiating CP since yesterday PM that is worsened with deep inspiration; pt reports that he has a Hx of cardiac health problems for which he is followed by a cardiologist at the [facility]. He states that current CP does not feel similar to past episodes. He reports Hx of blood clots in the legs; he denies any recent long, sedentary trips. He also c/o some nausea and vomiting (with blood) approximately 1 week ago for which he was seen at [facility] ED, diagnosed with pneumonia, and Rx'd pain medication and antibiotics; he denies use of pain medication as he is currently using Hydrocodone for chronic back pain but reports he has been compliant in taking the antibiotics with initial improvement. He denies use of tobacco. He specifically denies any F/C, cough, or SOB.    PMhx is significant for: Chronic back pain, CHF, CAD, parkinson's  SMhx is significant for: Back surgeries x 3, appendectomy, cholecystectomy, cardiac cath and cardiac stent  Social hx: - Smoke                   - EtOH                   - Illicit Drugs    There are no other complaints, changes or physical findings at this time.  Written by Baldwin Crown, ED Scribe, as dictated by Jae Dire, MD.    The history is provided by the patient.        Past Medical History   Diagnosis Date   ??? Back pain    ??? Heart failure (HCC)      CHF   ??? CAD (coronary artery disease)      cardiac blockage   ??? Psychiatric disorder      anxiety   ??? Hiatal hernia         Past Surgical History   Procedure Laterality Date   ??? Hx orthopaedic       back surgery x3, last June 2010   ??? Hx appendectomy     ??? Hx cholecystectomy     ??? Hx hernia repair     ??? Pr cardiac surg procedure unlist       cardiac cath August 2009   ??? Pr cardiac surg procedure unlist       x 1 stent[facility]   ??? Pr cardiac surg procedure unlist        insertion of loop recorder-no MRI         No family history on file.     History     Social History   ??? Marital Status: WIDOWED     Spouse Name: N/A     Number of Children: N/A   ??? Years of Education: N/A     Occupational History   ??? Not on file.     Social History Main Topics   ??? Smoking status: Never Smoker    ??? Smokeless tobacco: Not on file   ??? Alcohol Use: No   ??? Drug Use: No   ??? Sexual Activity: Not on file     Other Topics Concern   ??? Not on file     Social History Narrative                  ALLERGIES: Seafood; Sulfa (sulfonamide  antibiotics); and Zoloft      Review of Systems   Constitutional: Negative for fever, chills and appetite change.   HENT: Negative for congestion.    Eyes: Negative for visual disturbance.   Respiratory: Negative for cough, shortness of breath and wheezing.    Cardiovascular: Positive for chest pain. Negative for palpitations and leg swelling.   Gastrointestinal: Positive for nausea and vomiting. Negative for abdominal pain.   Genitourinary: Negative for dysuria, urgency and frequency.   Musculoskeletal: Positive for back pain (baseline). Negative for myalgias, joint swelling and neck stiffness.   Skin: Negative for rash.   Neurological: Negative for dizziness, syncope, weakness and headaches.   Hematological: Negative for adenopathy.   Psychiatric/Behavioral: Negative for behavioral problems and dysphoric mood.       Filed Vitals:    06/07/14 1638   BP: 107/63   Temp: 97.5 ??F (36.4 ??C)   Height: 6\' 1"  (1.854 m)   Weight: 97.523 kg (215 lb)   SpO2: 99%            Physical Exam   Constitutional: He is oriented to person, place, and time. He appears well-developed and well-nourished. No distress.   HENT:   Head: Normocephalic and atraumatic.   Mouth/Throat: Oropharynx is clear and moist.   Eyes: Conjunctivae and EOM are normal. Pupils are equal, round, and reactive to light. No scleral icterus.   Neck: Normal range of motion. Neck supple.    Cardiovascular: Regular rhythm.  Bradycardia present.  Exam reveals no gallop.    No murmur heard.  Pulmonary/Chest: Effort normal. No stridor. No respiratory distress. He has no wheezes. He has no rales.   Abdominal: Soft. Bowel sounds are normal. He exhibits no distension and no mass. There is no tenderness. There is no rebound and no guarding.   Musculoskeletal: Normal range of motion. He exhibits no edema.   Lymphadenopathy:     He has no cervical adenopathy.   Neurological: He is alert and oriented to person, place, and time. No cranial nerve deficit. Coordination normal.   There is a low frequency tremor of the LUE   Skin: Skin is warm and dry. No rash noted. No erythema.   Psychiatric: He has a normal mood and affect.   Nursing note and vitals reviewed.  Written by Kateri Mc, ED Scribe, as dictated by Jae Dire, MD.     MDM  Number of Diagnoses or Management Options  Diagnosis management comments: DDx: ACS, pneumonia, GERD       Amount and/or Complexity of Data Reviewed  Clinical lab tests: ordered and reviewed  Tests in the radiology section of CPT??: ordered and reviewed  Tests in the medicine section of CPT??: ordered and reviewed  Review and summarize past medical records: yes  Independent visualization of images, tracings, or specimens: yes    Patient Progress  Patient progress: stable      Procedures    EKG interpretation: (Preliminary)  Rhythm: sinus bradycardia; and regular . Rate (approx.): 56; Axis: normal; P wave: normal; QRS interval: normal ; ST/T wave: non-specific changes; Other findings: normal.    6:56 PM  Unremarkable workup; negative cardiac markers, negative d-dimer. Will discharge with symptomatic treatment for pleurisy, follow up with PMD at [facility] as needed.  Philbert Riser, MD

## 2014-06-09 LAB — EKG, 12 LEAD, INITIAL
Atrial Rate: 56 {beats}/min
Calculated P Axis: 31 degrees
Calculated R Axis: 18 degrees
Calculated T Axis: 22 degrees
P-R Interval: 164 ms
Q-T Interval: 444 ms
QRS Duration: 90 ms
QTC Calculation (Bezet): 428 ms
Ventricular Rate: 56 {beats}/min

## 2014-08-05 ENCOUNTER — Inpatient Hospital Stay
Admit: 2014-08-05 | Discharge: 2014-08-05 | Discharge status: Other Acute Inpatient Hospital | Attending: Emergency Medicine

## 2014-08-05 DIAGNOSIS — M545 Low back pain: Secondary | ICD-10-CM

## 2014-08-05 LAB — METABOLIC PANEL, COMPREHENSIVE
A-G Ratio: 1.2 (ref 1.1–2.2)
ALT (SGPT): 18 U/L (ref 12–78)
AST (SGOT): 14 U/L — ABNORMAL LOW (ref 15–37)
Albumin: 3.1 g/dL — ABNORMAL LOW (ref 3.5–5.0)
Alk. phosphatase: 72 U/L (ref 45–117)
Anion gap: 5 mmol/L (ref 5–15)
BUN/Creatinine ratio: 12 (ref 12–20)
BUN: 12 MG/DL (ref 6–20)
Bilirubin, total: 0.3 MG/DL (ref 0.2–1.0)
CO2: 29 mmol/L (ref 21–32)
Calcium: 7.9 MG/DL — ABNORMAL LOW (ref 8.5–10.1)
Chloride: 111 mmol/L — ABNORMAL HIGH (ref 97–108)
Creatinine: 0.97 MG/DL (ref 0.70–1.30)
GFR est AA: >60 mL/min/{1.73_m2} (ref >60)
GFR est non-AA: >60 mL/min/{1.73_m2} (ref >60)
Globulin: 2.5 g/dL (ref 2.0–4.0)
Glucose: 104 mg/dL — ABNORMAL HIGH (ref 65–100)
Potassium: 4.1 mmol/L (ref 3.5–5.1)
Protein, total: 5.6 g/dL — ABNORMAL LOW (ref 6.4–8.2)
Sodium: 145 mmol/L (ref 136–145)

## 2014-08-05 LAB — PROTHROMBIN TIME + INR
INR: 1 (ref 0.9–1.1)
Prothrombin time: 10.4 s (ref 9.0–11.1)

## 2014-08-05 LAB — CBC WITH AUTOMATED DIFF
ABS. BASOPHILS: 0 10*3/uL (ref 0.0–0.1)
ABS. EOSINOPHILS: 0.1 10*3/uL (ref 0.0–0.4)
ABS. LYMPHOCYTES: 1 10*3/uL (ref 0.8–3.5)
ABS. MONOCYTES: 0.4 10*3/uL (ref 0.0–1.0)
ABS. NEUTROPHILS: 2.6 10*3/uL (ref 1.8–8.0)
BASOPHILS: 0 % (ref 0–1)
EOSINOPHILS: 3 % (ref 0–7)
HCT: 40.2 % (ref 36.6–50.3)
HGB: 13.4 g/dL (ref 12.1–17.0)
LYMPHOCYTES: 24 % (ref 12–49)
MCH: 32.4 PG (ref 26.0–34.0)
MCHC: 33.3 g/dL (ref 30.0–36.5)
MCV: 97.3 FL (ref 80.0–99.0)
MONOCYTES: 9 % (ref 5–13)
NEUTROPHILS: 64 % (ref 32–75)
PLATELET: 94 10*3/uL — ABNORMAL LOW (ref 150–400)
RBC: 4.13 M/uL (ref 4.10–5.70)
RDW: 12.4 % (ref 11.5–14.5)
WBC: 4 10*3/uL — ABNORMAL LOW (ref 4.1–11.1)

## 2014-08-05 LAB — URINALYSIS W/MICROSCOPIC
Bacteria: NEGATIVE /hpf
Bilirubin: NEGATIVE
Blood: NEGATIVE
Glucose: NEGATIVE mg/dL
Ketone: NEGATIVE mg/dL
Leukocyte Esterase: NEGATIVE
Nitrites: NEGATIVE
Protein: NEGATIVE mg/dL
Specific gravity: 1.02 (ref 1.003–1.030)
Urobilinogen: 0.2 EU/dL (ref 0.2–1.0)
pH (UA): 6 (ref 5.0–8.0)

## 2014-08-05 LAB — PTT: aPTT: 22.9 s (ref 22.1–32.5)

## 2014-08-05 MED ORDER — MORPHINE 10 MG/ML INJ SOLUTION
10 mg/ml | INTRAMUSCULAR | Status: AC
Start: 2014-08-05 — End: 2014-08-05
  Administered 2014-08-05: 17:00:00 via INTRAVENOUS

## 2014-08-05 MED ORDER — KETOROLAC TROMETHAMINE 30 MG/ML INJECTION
30 mg/mL (1 mL) | INTRAMUSCULAR | Status: AC
Start: 2014-08-05 — End: 2014-08-05
  Administered 2014-08-05: 13:00:00 via INTRAVENOUS

## 2014-08-05 MED ORDER — MORPHINE 4 MG/ML SYRINGE
4 mg/mL | INTRAMUSCULAR | Status: AC
Start: 2014-08-05 — End: 2014-08-05
  Administered 2014-08-05: 22:00:00 via INTRAVENOUS

## 2014-08-05 MED ORDER — DIAZEPAM 5 MG/ML SYRINGE
5 mg/mL | INTRAMUSCULAR | Status: AC
Start: 2014-08-05 — End: 2014-08-05

## 2014-08-05 MED ORDER — MORPHINE 2 MG/ML INJECTION
2 mg/mL | INTRAMUSCULAR | Status: AC
Start: 2014-08-05 — End: 2014-08-05
  Administered 2014-08-05: 15:00:00 via INTRAVENOUS

## 2014-08-05 MED ORDER — DIAZEPAM 5 MG/ML SYRINGE
5 mg/mL | INTRAMUSCULAR | Status: AC
Start: 2014-08-05 — End: 2014-08-05
  Administered 2014-08-05: 18:00:00 via INTRAVENOUS

## 2014-08-05 MED ORDER — HYDROMORPHONE (PF) 1 MG/ML IJ SOLN
1 mg/mL | INTRAMUSCULAR | Status: AC
Start: 2014-08-05 — End: 2014-08-05
  Administered 2014-08-05: 13:00:00 via INTRAVENOUS

## 2014-08-05 MED ORDER — ONDANSETRON (PF) 4 MG/2 ML INJECTION
4 mg/2 mL | INTRAMUSCULAR | Status: AC
Start: 2014-08-05 — End: 2014-08-05
  Administered 2014-08-05: 17:00:00 via INTRAVENOUS

## 2014-08-05 MED FILL — DIAZEPAM 5 MG/ML SYRINGE: 5 mg/mL | INTRAMUSCULAR | Qty: 2

## 2014-08-05 MED FILL — MORPHINE 10 MG/ML IJ SOLN: 10 mg/mL | INTRAMUSCULAR | Qty: 1

## 2014-08-05 MED FILL — HYDROMORPHONE (PF) 1 MG/ML IJ SOLN: 1 mg/mL | INTRAMUSCULAR | Qty: 1

## 2014-08-05 MED FILL — MORPHINE 4 MG/ML SYRINGE: 4 mg/mL | INTRAMUSCULAR | Qty: 1

## 2014-08-05 MED FILL — KETOROLAC TROMETHAMINE 30 MG/ML INJECTION: 30 mg/mL (1 mL) | INTRAMUSCULAR | Qty: 1

## 2014-08-05 MED FILL — ONDANSETRON (PF) 4 MG/2 ML INJECTION: 4 mg/2 mL | INTRAMUSCULAR | Qty: 2

## 2014-08-05 MED FILL — MORPHINE 2 MG/ML INJECTION: 2 mg/mL | INTRAMUSCULAR | Qty: 3

## 2014-08-05 NOTE — ED Notes (Signed)
Patient rang out stating "I didn't realize I did it but I peed on myself."

## 2014-08-05 NOTE — ED Provider Notes (Signed)
HPI Comments: Dale Weaver is a 63 yo M with pmhx significant for chronic back pain presenting via EMS to the ED with c/o sudden onset lower back pain since 0400 AM today. Pt states he went to get up to go to the commode for a BM when he "twisted" his body getting up and had sudden onset of pain causing him to lay back down in bed as he was unable to ambulate due to pain. Pt reports while he was in bed he had one episode of urinary incontinence and one episode of fecal incontinence. Pt also reports nausea and lightheadedness which he associates with pain. Pt rates pain 10/10, describes pain as an aching sensation. Pt notes "spasm" sensation in his lower back any time he moves. Pt also notes pain is worse with movement. Pt states pain feels similar to pain he experienced prior to lumbar laminectomy in 2014. Pt denies any use of blood thinners or vomiting.     PCP: None  PMHx significant for: back pan, CHF, CAD, anxiety, hiatal hernia   PSHx significant for: orthopedic, appendectomy, cholecystectomy, hernia repair, cardiac cath, cardiac loop recorder  Social hx: -tobacco, -EtOH, -drugs     There were no other complaints, changes, physical findings at this time.   Written by Benjiman Core, ED Scribe, as dictated by Luther Hearing, MD    The history is provided by the patient.        Past Medical History:   Diagnosis Date   ??? Back pain    ??? Heart failure (HCC)      CHF   ??? CAD (coronary artery disease)      cardiac blockage   ??? Psychiatric disorder      anxiety   ??? Hiatal hernia        Past Surgical History:   Procedure Laterality Date   ??? Hx orthopaedic       back surgery x3, last June 2010   ??? Hx appendectomy     ??? Hx cholecystectomy     ??? Hx hernia repair     ??? Pr cardiac surg procedure unlist       cardiac cath August 2009   ??? Pr cardiac surg procedure unlist       x 1 stent[facility]   ??? Pr cardiac surg procedure unlist       insertion of loop recorder-no MRI          History reviewed. No pertinent family history.    History     Social History   ??? Marital Status: WIDOWED     Spouse Name: N/A     Number of Children: N/A   ??? Years of Education: N/A     Occupational History   ??? Not on file.     Social History Main Topics   ??? Smoking status: Never Smoker    ??? Smokeless tobacco: Not on file   ??? Alcohol Use: No   ??? Drug Use: No   ??? Sexual Activity: Not on file     Other Topics Concern   ??? Not on file     Social History Narrative                ALLERGIES: Seafood; Sulfa (sulfonamide antibiotics); and Zoloft      Review of Systems   Constitutional: Negative for fever and chills.   HENT: Negative for congestion, rhinorrhea, sneezing and sore throat.    Eyes: Negative for redness  and visual disturbance.   Respiratory: Negative for shortness of breath.    Cardiovascular: Negative for chest pain and leg swelling.   Gastrointestinal: Positive for nausea. Negative for vomiting and abdominal pain.        +fecal incontinence    Genitourinary: Negative for frequency and difficulty urinating.        +urinary incontinence    Musculoskeletal: Positive for back pain. Negative for myalgias and neck stiffness.   Skin: Negative for rash.   Neurological: Positive for light-headedness. Negative for dizziness, syncope, weakness and headaches.   Hematological: Negative for adenopathy.   All other systems reviewed and are negative.    Patient Vitals for the past 12 hrs:   Temp Pulse Resp BP SpO2   08/05/14 1439 - - 16 131/69 mmHg 95 %   08/05/14 1100 - 73 19 134/72 mmHg 97 %   08/05/14 0900 - 60 24 129/71 mmHg 95 %   08/05/14 0730 - 60 17 124/72 mmHg 95 %   08/05/14 0601 97.7 ??F (36.5 ??C) 63 20 140/79 mmHg 95 %              Physical Exam   Constitutional: He is oriented to person, place, and time. He appears well-developed and well-nourished.   Appears uncomfortable    HENT:   Head: Normocephalic and atraumatic.   Mouth/Throat: Oropharynx is clear and moist and mucous membranes are normal.    Eyes: EOM are normal.   Neck: Normal range of motion and full passive range of motion without pain. Neck supple.   Cardiovascular: Normal rate, regular rhythm, normal heart sounds, intact distal pulses and normal pulses.    No murmur heard.  Pulmonary/Chest: Effort normal and breath sounds normal. No respiratory distress. He exhibits no tenderness.   Abdominal: Soft. Normal appearance and bowel sounds are normal. There is no tenderness. There is no rebound and no guarding.   Genitourinary:   Fair rectal tone, brown stool in rectal vault    Musculoskeletal:   Diffuse mid-line lower lumbar tenderness; para-spinal lumbar tenderness    Neurological: He is alert and oriented to person, place, and time.   1/5 strength in BLE    Skin: Skin is warm, dry and intact. No rash noted. No erythema.   Psychiatric: He has a normal mood and affect. His speech is normal and behavior is normal. Judgment and thought content normal.   Nursing note and vitals reviewed.  Written by Alcide Evener, ED Scribe, as dictated by Luther Hearing, MD    MDM  Number of Diagnoses or Management Options  Bowel incontinence:   Midline low back pain without sciatica:   Urinary incontinence without sensory awareness:   Diagnosis management comments: DDx: Cauda equina, musculoskeletal strain, herniated disc, acute exacerbation of chronic back pain        Amount and/or Complexity of Data Reviewed  Clinical lab tests: ordered and reviewed  Tests in the radiology section of CPT??: ordered and reviewed  Review and summarize past medical records: yes  Discuss the patient with other providers: yes    Patient Progress  Patient progress: stable      Procedures    Procedure Note- Peripheral IV access with Ultrasound Guidance  7:59 AM  Luther Hearing, MD gained IV access using  18 gauge needle because the patient had no vascular access.  After cleaning the site with alcohol prep, the R AC vein was localized with ultrasound guidance in an anterior  approach.  Line confirmation  was obtained by direct visualization and good blood return. No anaesthetic was used.  The line was successfully flushed with normal saline and was secured with transparent tape.  The patient tolerated the procedure without complication.    One attempt.   Written by Alcide Evener, ED Scribe, as dictated by Luther Hearing, MD    8:55 AM  Pt had another episode of urinary incontinence.   Written by Alcide Evener, ED Scribe, as dictated by Luther Hearing, MD    1:51 PM  Reviewed MRI results with pt  Written by Alcide Evener, ED Scribe, as dictated by Luther Hearing, MD    2:00 PM  Attempted to page [facility] neurosurgery  Written by Alcide Evener, ED Scribe, as dictated by Luther Hearing, MD     CONSULT NOTE:   2:49 PM  Luther Hearing, MD spoke with Dr. Lysbeth Galas,   Specialty: Neurosurgery at [facility]  Discussed pt's hx, disposition, and available diagnostic and imaging results. Reviewed care plans. Consultant agrees with plans as outlined.  Accepts pt for transfer.   Written by Alcide Evener, ED Scribe, as dictated by Luther Hearing, MD.    3:00 PM  Attempted to get MRI thoracic today; MRI is unavailable to do that prior to transfer  Written by Alcide Evener, ED Scribe, as dictated by Luther Hearing, MD    CONSULT NOTE:   3:22 PM  Luther Hearing, MD spoke with Dr. Roger Shelter,  Specialty: Neurosurgery   Discussed pt's hx, disposition, and available diagnostic and imaging results. Reviewed care plans. Consultant agrees with plans as outlined. If there is a bed available wants pt transferred to [place].   Written by Alcide Evener, ED Scribe, as dictated by Luther Hearing, MD.    3:34 PM  Spoke with Dr. Nanetta Batty, recommends transfer to ER at [facility].  Written by Alcide Evener, ED Scribe, as dictated by Luther Hearing, MD    3:36 PM  [facility] and cancelled transfer.   Written by Alcide Evener, ED Scribe, as dictated by Luther Hearing, MD    3:38 PM  Spoke with ER MD at [facility], Dr. Marvell Fuller accepts transfer of pt ER to ER  Written by Alcide Evener, ED Scribe, as dictated by Luther Hearing, MD     LABORATORY TESTS:  Recent Results (from the past 12 hour(s))   CBC WITH AUTOMATED DIFF    Collection Time: 08/05/14  6:29 AM   Result Value Ref Range    WBC 4.0 (L) 4.1 - 11.1 K/uL    RBC 4.13 4.10 - 5.70 M/uL    HGB 13.4 12.1 - 17.0 g/dL    HCT 40.2 36.6 - 50.3 %    MCV 97.3 80.0 - 99.0 FL    MCH 32.4 26.0 - 34.0 PG    MCHC 33.3 30.0 - 36.5 g/dL    RDW 12.4 11.5 - 14.5 %    PLATELET 94 (L) 150 - 400 K/uL    NEUTROPHILS 64 32 - 75 %    LYMPHOCYTES 24 12 - 49 %    MONOCYTES 9 5 - 13 %    EOSINOPHILS 3 0 - 7 %    BASOPHILS 0 0 - 1 %    ABS. NEUTROPHILS 2.6 1.8 - 8.0 K/UL    ABS. LYMPHOCYTES 1.0 0.8 - 3.5 K/UL    ABS. MONOCYTES 0.4 0.0 - 1.0 K/UL    ABS. EOSINOPHILS 0.1 0.0 -  0.4 K/UL    ABS. BASOPHILS 0.0 0.0 - 0.1 K/UL   METABOLIC PANEL, COMPREHENSIVE    Collection Time: 08/05/14  6:29 AM   Result Value Ref Range    Sodium 145 136 - 145 mmol/L    Potassium 4.1 3.5 - 5.1 mmol/L    Chloride 111 (H) 97 - 108 mmol/L    CO2 29 21 - 32 mmol/L    Anion gap 5 5 - 15 mmol/L    Glucose 104 (H) 65 - 100 mg/dL    BUN 12 6 - 20 MG/DL    Creatinine 0.97 0.70 - 1.30 MG/DL    BUN/Creatinine ratio 12 12 - 20      GFR est AA >60 >60 ml/min/1.37m2    GFR est non-AA >60 >60 ml/min/1.22m2    Calcium 7.9 (L) 8.5 - 10.1 MG/DL    Bilirubin, total 0.3 0.2 - 1.0 MG/DL    ALT 18 12 - 78 U/L    AST 14 (L) 15 - 37 U/L    Alk. phosphatase 72 45 - 117 U/L    Protein, total 5.6 (L) 6.4 - 8.2 g/dL    Albumin 3.1 (L) 3.5 - 5.0 g/dL    Globulin 2.5 2.0 - 4.0 g/dL    A-G Ratio 1.2 1.1 - 2.2     PROTHROMBIN TIME + INR    Collection Time: 08/05/14  6:29 AM   Result Value Ref Range    INR 1.0 0.9 - 1.1      Prothrombin time 10.4 9.0 - 11.1 sec   PTT    Collection Time: 08/05/14  6:29 AM    Result Value Ref Range    aPTT 22.9 22.1 - 32.5 sec    aPTT, therapeutic range     58.0 - 77.0 SECS   URINALYSIS W/MICROSCOPIC    Collection Time: 08/05/14  6:29 AM   Result Value Ref Range    Color YELLOW/STRAW      Appearance CLEAR CLEAR      Specific gravity 1.020 1.003 - 1.030      pH (UA) 6.0 5.0 - 8.0      Protein NEGATIVE  NEG mg/dL    Glucose NEGATIVE  NEG mg/dL    Ketone NEGATIVE  NEG mg/dL    Bilirubin NEGATIVE  NEG      Blood NEGATIVE  NEG      Urobilinogen 0.2 0.2 - 1.0 EU/dL    Nitrites NEGATIVE  NEG      Leukocyte Esterase NEGATIVE  NEG      WBC 0-4 0 - 4 /hpf    RBC 0-5 0 - 5 /hpf    Epithelial cells FEW FEW /lpf    Bacteria NEGATIVE  NEG /hpf       IMAGING RESULTS:   MRI LUMB SPINE WO CONT (Final result) Result time: 08/05/14 13:33:38    Final result by Rad Results In Edi (08/05/14 13:33:38)    Narrative:    **Final Report**      ICD Codes / Adm.Diagnosis: 12 786.59 / Back Pain   Examination: MR L SPINE WO CON - [id number] - Aug 05 2014 1:12PM  Accession No: [id number]  Reason: Acute low back pain this morning at 0400 with bowel and bladder   incontinence, hi      REPORT:  EXAM: MR L SPINE WO CON  Clinical history: Acute low back pain since this morning at 4A with bowel   and bladder incontinence  INDICATION: Acute low back pain  this morning at 0400 with bowel and bladder   incontinence, hi. Back Pain     COMPARISON: 05/15/2012 TECHNIQUE: MR imaging of the lumbar spine was   performed using the following sequences: sagittal T1, T2, STIR; axial T1,   T2.     CONTRAST: None.    FINDINGS:  Laminectomy L2-L3 L3-L4 and L4-L5 without recurrent central canal stenosis.  The alignment of the lumbar spine is remarkable for stable 3 mm   retrolisthesis at L2-3 and L3-4, with multilevel spondylosis and   degenerative disc disease.. Disc space height is diminished at L5-S1. There   is partial fusion at T11-12 with mild disc space narrowing as well. The    vertebral body heights are otherwise well-preserved. Loss of disc height at   T12-L1, L1-L2, L2-L3, L3-L4, L5-S1.    There is no clumping of the cauda equina. The conus medullaris terminates at   L1.    The findings at each level are as follows:     T12-L1: No herniation or stenosis.    L1-L2: . No herniation or stenosis.    L2-L3: Minimal stable retrolisthesis. Status post laminectomy. Facet   arthropathy and hypertrophy. The spinal canal is widely patent. There is   moderate bilateral foraminal stenosis with effacement nerve roots extending   to the right and left L3-4 foramen    L3-L4: Grade 1 retrolisthesis with no central stenosis, broad spondylosis   and bulge, moderate facet arthropathy and moderate bilateral foraminal   stenoses.        L4-L5: The spinal canal is widely patent. Facet arthropathy and hypertrophy   greater on the right and on the left. Severe bilateral foraminal stenoses.     L5-S1: Facet arthropathy and hypertrophy. Mild broad-based disc   protrusion/osteophyte. Spinal canal is patent. Severe bilateral foraminal   stenoses.      IMPRESSION:   1. No acute fracture or dislocation. No evidence of cauda equina. No cord   compression.    2. Multilevel degenerative disc disease and stenosis with foraminal stenosis.  3. No discrete herniation.  4. No unusual postoperative findings.  5. Severe bilateral foraminal stenoses at L4-5 and L5-S1.  6. Moderate bilateral foraminal stenoses at L2-3 and L3-4.          Signing/Reading Doctor: Mariel Sleet [phone])   Approved: AMOS HABIB [phone]) Aug 05 2014 1:31PM            MEDICATIONS GIVEN:  Medications   HYDROmorphone (PF) (DILAUDID) injection 1 mg (1 mg IntraVENous Given 08/05/14 0805)   ketorolac (TORADOL) injection 30 mg (30 mg IntraVENous Given 08/05/14 0733)   morphine injection 6 mg (6 mg IntraVENous Given 08/05/14 0937)   morphine injection 6 mg (6 mg IntraVENous Given 08/05/14 1214)    ondansetron (ZOFRAN) injection 4 mg (4 mg IntraVENous Given 08/05/14 1214)   diazepam (VALIUM) injection 5 mg (5 mg IntraVENous Given 08/05/14 1243)     IMPRESSION:  1. Midline low back pain without sciatica    2. Urinary incontinence without sensory awareness    3. Bowel incontinence        PLAN:  1. Transfer to [facility]    3:38 PM  Patient is being transferred to [facility]. The results of their tests and reasons for their transfer have been discussed with them and/or available family. They convey agreement and understanding for the need to be transferred to another medical facility for further care. Consultation has been made with the accepting physician specialist(s).  Written by Alcide Evener, ED Scribe, as dictated by Luther Hearing, MD

## 2014-08-05 NOTE — ED Notes (Signed)
Patient presents to ED via EMS for complaint of lower back pain onset approximately 0400 after twisting his body getting up from the toilet.  Patient states he laid back down and "went number one and two on myself."  Patient alert and oriented.  Respirations even and unlabored.  VS WNL. 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

## 2014-08-05 NOTE — ED Notes (Addendum)
TRANSFER - IN REPORT:    Verbal report received from Tammy RN (name) on [employer].  Report consisted of patient???s Situation, Background, Assessment and   Recommendations(SBAR).   Information from the following report(s) SBAR and ED Summary was reviewed with the receiving nurse.    Opportunity for questions and clarification was provided.      Patient reports lower back pain x 0400 this am. Patient with hx of back pain/problems and states he normally goes to [place] but states last night they were on diversion. Patient states he got worried last night when he went "number 1 and 2" on himself. Patient medicated with IV Toradol however patient reported burning at IV site. IV site noted to be infiltrated. IV removed Will attempt to start another IV.    Patient provided with MRI screening form

## 2014-08-05 NOTE — ED Notes (Signed)
Patient back from MRI stating "I wet myself." PCT Annabell Sabal bedside to clean patient

## 2014-08-05 NOTE — ED Notes (Signed)
Patient medicated as ordered. Reports pain 9/10.

## 2014-08-05 NOTE — ED Notes (Signed)
MRI screening form faxed to MRI.

## 2014-08-05 NOTE — Progress Notes (Signed)
-  Please complete MRI History and Safety Screening Form for this patient using [employer] only under Orders Requiring a Screening Form:    Example:  Orders Requiring a Screening Form     Procedure Order Status Form Status    MRI EXAM Active In Progress     - Answer all questions completely, including Weight, Surgery, Allergy, and Implant History.   - Document MUST be "eSigned" using a "Signature Pad" by the person completing form, the patient, and the RN or MD completing form with the patient.  - Patient cannot be scanned until this form is completed and reviewed in MRI to ensure patient is SAFE and eligible for MRI.  - CALL MRI when this has been successfully completed at [phone].  - This must be done under [employer]. Do not use the Nursing Flowsheets.

## 2014-08-05 NOTE — ED Notes (Signed)
Patient resting on stretcher Reports pain 8/10 Patient updated on plan of care with pending consult to Neurosurgeon for further plan.

## 2014-08-05 NOTE — ED Notes (Signed)
Bedside shift change report given to Dunbar (oncoming nurse) by Rodena Medin RN (offgoing nurse). Report included the following information SBAR.

## 2014-08-05 NOTE — ED Notes (Signed)
TRANSFER - OUT REPORT:    Verbal report given to Tonette Bihari, RN (name) on Dale Weaver  being transferred to [facility] (unit) for routine progression of care       Report consisted of patient???s Situation, Background, Assessment and   Recommendations(SBAR).     Information from the following report(s) SBAR, ED Summary, [facility] and Recent Results was reviewed with the receiving nurse.    Lines:   Peripheral IV 81/82/99 Right Basilic (Active)   Site Assessment Clean, dry, & intact 08/05/2014  8:05 AM   Phlebitis Assessment 0 08/05/2014  8:05 AM   Infiltration Assessment 0 08/05/2014  8:05 AM   Dressing Status Clean, dry, & intact 08/05/2014  8:05 AM        Opportunity for questions and clarification was provided.      Patient transported with:   ems

## 2014-08-05 NOTE — ED Notes (Signed)
MTI at bedside for transport. EMTALA signed and given to EMS.

## 2014-08-05 NOTE — ED Notes (Signed)
ETA for MTI 1-1.5 hrs.

## 2014-12-01 ENCOUNTER — Inpatient Hospital Stay
Admit: 2014-12-01 | Discharge: 2014-12-03 | Disposition: A | Discharge status: Home / Self Care | Payer: MEDICAID | Attending: Internal Medicine | Admitting: Internal Medicine

## 2014-12-01 DIAGNOSIS — G459 Transient cerebral ischemic attack, unspecified: Principal | ICD-10-CM

## 2014-12-01 LAB — EKG, 12 LEAD, INITIAL
Atrial Rate: 62 {beats}/min
Calculated P Axis: 55 degrees
Calculated R Axis: 58 degrees
Calculated T Axis: 58 degrees
Diagnosis: NORMAL
P-R Interval: 174 ms
Q-T Interval: 440 ms
QRS Duration: 100 ms
QTC Calculation (Bezet): 446 ms
Ventricular Rate: 62 {beats}/min

## 2014-12-01 LAB — METABOLIC PANEL, COMPREHENSIVE
A-G Ratio: 1.1 (ref 1.1–2.2)
ALT (SGPT): 74 U/L (ref 12–78)
AST (SGOT): 22 U/L (ref 15–37)
Albumin: 3.3 g/dL — ABNORMAL LOW (ref 3.5–5.0)
Alk. phosphatase: 101 U/L (ref 45–117)
Anion gap: 8 mmol/L (ref 5–15)
BUN/Creatinine ratio: 26 — ABNORMAL HIGH (ref 12–20)
BUN: 21 MG/DL — ABNORMAL HIGH (ref 6–20)
Bilirubin, total: 0.6 MG/DL (ref 0.2–1.0)
CO2: 28 mmol/L (ref 21–32)
Calcium: 8.6 MG/DL (ref 8.5–10.1)
Chloride: 112 mmol/L — ABNORMAL HIGH (ref 97–108)
Creatinine: 0.8 MG/DL (ref 0.70–1.30)
GFR est AA: >60 mL/min/{1.73_m2} (ref >60)
GFR est non-AA: >60 mL/min/{1.73_m2} (ref >60)
Globulin: 2.9 g/dL (ref 2.0–4.0)
Glucose: 94 mg/dL (ref 65–100)
Potassium: 3.4 mmol/L — ABNORMAL LOW (ref 3.5–5.1)
Protein, total: 6.2 g/dL — ABNORMAL LOW (ref 6.4–8.2)
Sodium: 148 mmol/L — ABNORMAL HIGH (ref 136–145)

## 2014-12-01 LAB — CBC WITH AUTOMATED DIFF
ABS. BASOPHILS: 0 10*3/uL (ref 0.0–0.1)
ABS. EOSINOPHILS: 0.1 10*3/uL (ref 0.0–0.4)
ABS. LYMPHOCYTES: 1.2 10*3/uL (ref 0.8–3.5)
ABS. MONOCYTES: 0.3 10*3/uL (ref 0.0–1.0)
ABS. NEUTROPHILS: 2.6 10*3/uL (ref 1.8–8.0)
BASOPHILS: 0 % (ref 0–1)
EOSINOPHILS: 2 % (ref 0–7)
HCT: 37.3 % (ref 36.6–50.3)
HGB: 12.7 g/dL (ref 12.1–17.0)
LYMPHOCYTES: 29 % (ref 12–49)
MCH: 32.2 PG (ref 26.0–34.0)
MCHC: 34 g/dL (ref 30.0–36.5)
MCV: 94.4 FL (ref 80.0–99.0)
MONOCYTES: 7 % (ref 5–13)
NEUTROPHILS: 62 % (ref 32–75)
PLATELET: 123 10*3/uL — ABNORMAL LOW (ref 150–400)
RBC: 3.95 M/uL — ABNORMAL LOW (ref 4.10–5.70)
RDW: 13.3 % (ref 11.5–14.5)
WBC: 4.1 10*3/uL (ref 4.1–11.1)

## 2014-12-01 LAB — PROTHROMBIN TIME + INR
INR: 1.2 — ABNORMAL HIGH (ref 0.9–1.1)
Prothrombin time: 12 s — ABNORMAL HIGH (ref 9.0–11.1)

## 2014-12-01 LAB — CK W/ CKMB & INDEX
CK - MB: 1 NG/ML (ref 0.5–3.6)
CK-MB Index: 2.7 — ABNORMAL HIGH (ref 0–2.5)
CK: 37 U/L — ABNORMAL LOW (ref 39–308)

## 2014-12-01 LAB — GLUCOSE, POC
Glucose (POC): 106 mg/dL — ABNORMAL HIGH (ref 65–100)
Glucose (POC): 101 mg/dL — ABNORMAL HIGH (ref 65–100)

## 2014-12-01 LAB — PTT: aPTT: 20 s — ABNORMAL LOW (ref 22.1–32.5)

## 2014-12-01 LAB — TROPONIN I: Troponin-I, Qt.: <0.04 ng/mL (ref <0.05)

## 2014-12-01 MED ORDER — SODIUM CHLORIDE 0.9 % IJ SYRG
INTRAMUSCULAR | Status: DC | PRN
Start: 2014-12-01 — End: 2014-12-03
  Administered 2014-12-02: 01:00:00 via INTRAVENOUS

## 2014-12-01 MED ORDER — MIRTAZAPINE 15 MG TAB
15 mg | Freq: Every evening | ORAL | Status: DC
Start: 2014-12-01 — End: 2014-12-03
  Administered 2014-12-02 – 2014-12-03 (×2): via ORAL

## 2014-12-01 MED ORDER — GADOBUTROL 10 MMOL/10 ML (1 MMOL/ML) IV
10 mmol/ mL (1 mmol/mL) | Freq: Once | INTRAVENOUS | Status: AC
Start: 2014-12-01 — End: 2014-12-01
  Administered 2014-12-01: 18:00:00 via INTRAVENOUS

## 2014-12-01 MED ORDER — FLUOXETINE 20 MG CAP
20 mg | Freq: Every day | ORAL | Status: DC
Start: 2014-12-01 — End: 2014-12-03
  Administered 2014-12-02 – 2014-12-03 (×2): via ORAL

## 2014-12-01 MED ORDER — SODIUM CHLORIDE 0.9 % IV
2 mg/mL | Freq: Three times a day (TID) | INTRAVENOUS | Status: DC | PRN
Start: 2014-12-01 — End: 2014-12-01

## 2014-12-01 MED ORDER — DIVALPROEX  250 MG 24 HR TAB
250 mg | Freq: Every evening | ORAL | Status: DC
Start: 2014-12-01 — End: 2014-12-03
  Administered 2014-12-02 – 2014-12-03 (×2): via ORAL

## 2014-12-01 MED ORDER — POTASSIUM CHLORIDE 10 MEQ/100 ML IV PIGGY BACK
10 mEq/0 mL | Freq: Once | INTRAVENOUS | Status: AC
Start: 2014-12-01 — End: 2014-12-01
  Administered 2014-12-01: 20:00:00 via INTRAVENOUS

## 2014-12-01 MED ORDER — ASPIRIN 300 MG RECTAL SUPPOSITORY
300 mg | Freq: Every day | RECTAL | Status: DC
Start: 2014-12-01 — End: 2014-12-01
  Administered 2014-12-01: 15:00:00 via RECTAL

## 2014-12-01 MED ORDER — ARIPIPRAZOLE 5 MG TAB
5 mg | Freq: Every day | ORAL | Status: DC
Start: 2014-12-01 — End: 2014-12-03
  Administered 2014-12-02 – 2014-12-03 (×2): via ORAL

## 2014-12-01 MED ORDER — MORPHINE 2 MG/ML INJECTION
2 mg/mL | INTRAMUSCULAR | Status: DC | PRN
Start: 2014-12-01 — End: 2014-12-03
  Administered 2014-12-01 – 2014-12-03 (×9): via INTRAVENOUS

## 2014-12-01 MED ORDER — ONDANSETRON (PF) 4 MG/2 ML INJECTION
4 mg/2 mL | Freq: Three times a day (TID) | INTRAMUSCULAR | Status: DC | PRN
Start: 2014-12-01 — End: 2014-12-03
  Administered 2014-12-01 – 2014-12-02 (×2): via INTRAVENOUS

## 2014-12-01 MED ORDER — SODIUM CHLORIDE 0.9 % IJ SYRG
Freq: Three times a day (TID) | INTRAMUSCULAR | Status: DC
Start: 2014-12-01 — End: 2014-12-03
  Administered 2014-12-01 – 2014-12-03 (×7): via INTRAVENOUS

## 2014-12-01 MED ORDER — LORAZEPAM 0.5 MG TAB
0.5 mg | Freq: Four times a day (QID) | ORAL | Status: DC | PRN
Start: 2014-12-01 — End: 2014-12-03
  Administered 2014-12-02: 17:00:00 via ORAL

## 2014-12-01 MED ORDER — POTASSIUM CHLORIDE 10 MEQ/100 ML IV PIGGY BACK
10 mEq/0 mL | INTRAVENOUS | Status: AC
Start: 2014-12-01 — End: 2014-12-01
  Administered 2014-12-01 (×4): via INTRAVENOUS

## 2014-12-01 MED ORDER — SODIUM CHLORIDE 0.9% BOLUS IV
0.9 % | Freq: Once | INTRAVENOUS | Status: AC
Start: 2014-12-01 — End: 2014-12-01
  Administered 2014-12-01: 15:00:00 via INTRAVENOUS

## 2014-12-01 MED ORDER — ENOXAPARIN 40 MG/0.4 ML SUB-Q SYRINGE
40 mg/0.4 mL | SUBCUTANEOUS | Status: DC
Start: 2014-12-01 — End: 2014-12-03
  Administered 2014-12-02 – 2014-12-03 (×2): via SUBCUTANEOUS

## 2014-12-01 MED ORDER — ONDANSETRON (PF) 4 MG/2 ML INJECTION
4 mg/2 mL | INTRAMUSCULAR | Status: AC
Start: 2014-12-01 — End: 2014-12-01
  Administered 2014-12-01: 14:00:00 via INTRAVENOUS

## 2014-12-01 MED ORDER — MORPHINE 2 MG/ML INJECTION
2 mg/mL | INTRAMUSCULAR | Status: AC
Start: 2014-12-01 — End: 2014-12-01
  Administered 2014-12-01: 14:00:00 via INTRAVENOUS

## 2014-12-01 MED ORDER — LORAZEPAM 2 MG/ML SYRINGE
2 mg/mL | Freq: Once | INTRAMUSCULAR | Status: AC
Start: 2014-12-01 — End: 2014-12-01
  Administered 2014-12-01: 17:00:00 via INTRAVENOUS

## 2014-12-01 MED ORDER — SODIUM CHLORIDE 0.9 % IV
INTRAVENOUS | Status: AC
Start: 2014-12-01 — End: 2014-12-02
  Administered 2014-12-01 – 2014-12-02 (×2): via INTRAVENOUS

## 2014-12-01 MED ORDER — CLOPIDOGREL 75 MG TAB
75 mg | Freq: Every day | ORAL | Status: DC
Start: 2014-12-01 — End: 2014-12-03
  Administered 2014-12-02 – 2014-12-03 (×2): via ORAL

## 2014-12-01 MED ORDER — ACETAMINOPHEN 650 MG RECTAL SUPPOSITORY
650 mg | Freq: Four times a day (QID) | RECTAL | Status: DC | PRN
Start: 2014-12-01 — End: 2014-12-03

## 2014-12-01 MED FILL — POTASSIUM CHLORIDE 10 MEQ/100 ML IV PIGGY BACK: 10 mEq/0 mL | INTRAVENOUS | Qty: 100

## 2014-12-01 MED FILL — ONDANSETRON (PF) 4 MG/2 ML INJECTION: 4 mg/2 mL | INTRAMUSCULAR | Qty: 2

## 2014-12-01 MED FILL — DEPAKOTE ER 250 MG TABLET,EXTENDED RELEASE: 250 mg | ORAL | Qty: 4

## 2014-12-01 MED FILL — SODIUM CHLORIDE 0.9 % IV: INTRAVENOUS | Qty: 1000

## 2014-12-01 MED FILL — MORPHINE 2 MG/ML INJECTION: 2 mg/mL | INTRAMUSCULAR | Qty: 1

## 2014-12-01 MED FILL — MORPHINE 2 MG/ML INJECTION: 2 mg/mL | INTRAMUSCULAR | Qty: 2

## 2014-12-01 MED FILL — ASPIRIN 300 MG RECTAL SUPPOSITORY: 300 mg | RECTAL | Qty: 1

## 2014-12-01 MED FILL — BD POSIFLUSH NORMAL SALINE 0.9 % INJECTION SYRINGE: INTRAMUSCULAR | Qty: 10

## 2014-12-01 MED FILL — ABILIFY 15 MG TABLET: 15 mg | ORAL | Qty: 1

## 2014-12-01 MED FILL — LORAZEPAM 2 MG/ML SYRINGE: 2 mg/mL | INTRAMUSCULAR | Qty: 1

## 2014-12-01 NOTE — ED Notes (Signed)
TRANSFER - OUT REPORT:    Verbal report given to [employer] (name) on Dale Weaver  being transferred to [place] (unit) for routine progression of care       Report consisted of patient???s Situation, Background, Assessment and   Recommendations(SBAR).     Information from the following report(s) SBAR was reviewed with the receiving nurse.    Lines:   Peripheral IV 12/01/14 Left Arm (Active)   Site Assessment Clean, dry, & intact 12/01/2014  8:18 AM   Phlebitis Assessment 0 12/01/2014  8:18 AM   Infiltration Assessment 0 12/01/2014  8:18 AM   Dressing Status Clean, dry, & intact 12/01/2014  8:18 AM   Dressing Type Tape;Transparent 12/01/2014  8:18 AM   Hub Color/Line Status Blue;Flushed 12/01/2014  8:18 AM        Opportunity for questions and clarification was provided.      Receiving RN aware that patient is currently in MRI     Spoke with Renee in MRI and updated her on plan and inpatient bed assignment received.

## 2014-12-01 NOTE — Consults (Signed)
PSYCHIATRIC CONSULTATION  Gwenlyn Saran, MD  [facility] [place], [address], [place] [address]  [phone]                         IDENTIFICATION:    Patient Name  Dale Weaver   Date of Birth [birth year]   CSN [id number]   Medical Record Number  [id number]      Age  64 y.o.   PCP Phys Other, MD   Admit date:  12/01/2014    Room Number  2240/01  @ [facility]   Date of Service  12/01/2014            HISTORY         REASON FOR CONSULTATION:  CC: "depression".    HISTORY OF PRESENT ILLNESS:    The patient Dale Weaver is a 64 y.o.  male veteran with a past psychiatric history of depression anxiety and PTSD (combat related), who presented with headache. 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 Pt reports/evidences the following emotional symptoms: anxiety, concern about health problems, difficulty sleeping, feeling depressed and poor concentration . Pt has been on Abilify, Remeron, prozac and klonopin for anxiety and mood. Says xanax worked better in the past but the psychiatrist at [place] recently switched him to klonopin which is not helpful.     There is some vague prior history of suicidal ideation but no suicide attempts. No history of psychosis. No history of manic symptoms. Few previous psych hospitalizations for depression at [place].      ALLERGIES:  Allergies   Allergen Reactions   ??? Seafood [Shellfish Containing Products] Hives     Allergy to fish only.  Can eat shrimp.  No allergy to IV contrast.   ??? Sulfa (Sulfonamide Antibiotics) Hives   ??? Zoloft [Sertraline] Itching      MEDICATIONS PRIOR TO ADMISSION:  Prescriptions prior to admission   Medication Sig   ??? ARIPiprazole (ABILIFY) 30 mg tablet Take 15 mg by mouth daily.   ??? carbidopa-levodopa (SINEMET) 25-100 mg per tablet Take 1 Tab by mouth three (3) times daily.   ??? FLUoxetine (PROZAC) 20 mg tablet Take 20 mg by mouth daily.    ??? gabapentin (NEURONTIN) 400 mg capsule Take 400 mg by mouth every eight (8) hours.   ??? divalproex ER (DEPAKOTE ER) 500 mg ER tablet Take 1,000 mg by mouth nightly.   ??? docusate sodium (COLACE) 100 mg capsule Take 100 mg by mouth two (2) times a day.   ??? CARBOXYMETHYLCELLULOS/GLYCERIN (LUBRICATING DROPS OP) Apply  to eye nightly.   ??? mirtazapine (REMERON) 30 mg tablet Take 30 mg by mouth nightly.   ??? omeprazole (PRILOSEC) 20 mg capsule Take 20 mg by mouth two (2) times a day.   ??? tiZANidine (ZANAFLEX) 2 mg tablet Take 2 mg by mouth every morning. 1 tab in AM and 2 tab bedtime   ??? tiZANidine (ZANAFLEX) 2 mg tablet Take 4 mg by mouth nightly. 1 tab in AM and 2 tabs bedtime   ??? traMADol (ULTRAM) 50 mg tablet Take 50 mg by mouth three (3) times daily as needed for Pain.   ??? cyanocobalamin (VITAMIN B12) 1,000 mcg/mL injection 1,000 mcg by IntraMUSCular route every thirty (30) days.   ??? cholecalciferol (VITAMIN D3) 1,000 unit tablet Take 1,000 Units by mouth three (3) times daily.   ???  bisacodyl (DULCOLAX) 10 mg suppository Insert 10 mg into rectum daily as needed.   ??? atorvastatin (LIPITOR) 80 mg tablet Take 40 mg by mouth daily.   ??? acetaminophen (TYLENOL) 500 mg tablet Take 500 mg by mouth every six (6) hours as needed for Pain.   ??? oxyCODONE IR (OXY-IR) 15 mg immediate release tablet Take 15 mg by mouth three (3) times daily.   ??? miconazole (MICOTIN) 2 % topical powder Apply  to affected area daily.   ??? aspirin delayed-release 81 mg tablet Take 1 Tab by mouth daily.      PAST MEDICAL HISTORY:  Active Ambulatory Problems     Diagnosis Date Noted   ??? CVA (cerebral infarction) ([place]) 01/20/2014   ??? Chest pain, pleuritic 06/01/2014   ??? Dyslipidemia 06/01/2014   ??? S/P angioplasty with stent--2008 06/01/2014   ??? Status post placement of implantable loop recorder 2013 06/01/2014   ??? 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 H/O alcohol abuse--alcohol-free since 3 yrs ago 06/01/2014    ??? 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 No Resolved Ambulatory Problems     Past Medical History   Diagnosis Date   ??? Back pain    ??? Heart failure ([place])    ??? CAD (coronary artery disease)    ??? Psychiatric disorder    ??? Hiatal hernia    ??? TIA (transient ischemic attack) 01/2014   ??? Parkinson's disease [facility])       Past Medical History   Diagnosis Date   ??? Back pain      chronic   ??? Heart failure (HCC)      CHF   ??? CAD (coronary artery disease)      MI 2008   ??? Psychiatric disorder      anxiety   ??? Hiatal hernia    ??? TIA (transient ischemic attack) 01/2014   ??? Parkinson's disease [facility])      Past Surgical History   Procedure Laterality Date   ??? Hx orthopaedic       back surgery x4, last June 2010   ??? Hx appendectomy     ??? Hx cholecystectomy     ??? Hx hernia repair     ??? Pr cardiac surg procedure unlist       cardiac cath August 2009   ??? Pr cardiac surg procedure unlist  2010     x 1 stent, cardiologist @ [facility] ??? Pr cardiac surg procedure unlist       insertion of loop recorder removed 06/2014      SOCIAL HISTORY:  Lives with a roommate   History     Social History Narrative     History   Substance Use Topics   ??? Smoking status: Never Smoker    ??? Smokeless tobacco: Not on file   ??? Alcohol Use: No      FAMILY HISTORY:  History reviewed.   Family History   Problem Relation Age of Onset   ??? Stroke Father    ??? Hypertension Mother        REVIEW of SYSTEMS:   As noted in history of present illness           MENTAL STATUS EXAM & VITALS     Oriented X3. Memory: WNL, He was able to recognize me (although he last saw me 1.5 yrs ago). Mood: depressed.   Affect:  Constricted, calm. Normal speech. Denies SI/HI. no delusions. no hallucinations. Thought process logical and   goal directed. Concentration limited. Insight/judgement Fair.             VITALS:     Patient Vitals for the past 24 hrs:   Temp Pulse Resp BP SpO2    12/01/14 1912 97.6 ??F (36.4 ??C) 99 18 116/76 mmHg 93 %   12/01/14 1508 97.5 ??F (36.4 ??C) 66 17 133/69 mmHg 98 %   12/01/14 1200 - 61 14 112/59 mmHg 97 %   12/01/14 1100 97.6 ??F (36.4 ??C) (!) 58 21 104/56 mmHg 93 %   12/01/14 1000 - (!) 56 13 103/60 mmHg 94 %   12/01/14 0900 - 63 29 108/64 mmHg 96 %   12/01/14 0800 - 66 26 115/57 mmHg 97 %   12/01/14 0704 97.9 ??F (36.6 ??C) 67 20 120/63 mmHg 97 %              DATA     Labs reviewed    MEDICATIONS       ALL MEDICATIONS  Current Facility-Administered Medications   Medication Dose Route Frequency   ??? sodium chloride (NS) flush 5-10 mL  5-10 mL IntraVENous Q8H   ??? sodium chloride (NS) flush 5-10 mL  5-10 mL IntraVENous PRN   ??? 0.9% sodium chloride infusion  100 mL/hr IntraVENous CONTINUOUS   ??? acetaminophen (TYLENOL) suppository 650 mg  650 mg Rectal Q6H PRN   ??? [START ON 12/02/2014] enoxaparin (LOVENOX) injection 40 mg  40 mg SubCUTAneous Q24H   ??? morphine injection 1 mg  1 mg IntraVENous Q4H PRN   ??? ondansetron (ZOFRAN) injection 4 mg  4 mg IntraVENous Q8H PRN   ??? [START ON 12/02/2014] clopidogrel (PLAVIX) tablet 75 mg  75 mg Oral DAILY   ??? [START ON 12/02/2014] ARIPiprazole (ABILIFY) tablet 15 mg  15 mg Oral DAILY   ??? divalproex ER (DEPAKOTE ER) 24 hour tablet 1,000 mg  1,000 mg Oral QHS   ??? [START ON 12/02/2014] FLUoxetine (PROzac) capsule 20 mg  20 mg Oral DAILY   ??? mirtazapine (REMERON) tablet 30 mg  30 mg Oral QHS   ??? LORazepam (ATIVAN) tablet 0.5 mg  0.5 mg Oral Q6H PRN   ??? [START ON 12/02/2014] atorvastatin (LIPITOR) tablet 40 mg  40 mg Oral DAILY   ??? carbidopa-levodopa (SINEMET) 25-100 mg per tablet 1 Tab  1 Tab Oral TID   ??? [START ON 12/02/2014] docusate sodium (COLACE) capsule 100 mg  100 mg Oral BID   ??? .PHARMACY TO SUBSTITUTE PER PROTOCOL    Per Protocol   ??? gabapentin (NEURONTIN) capsule 400 mg  400 mg Oral Q8H   ??? oxyCODONE IR (ROXICODONE) tablet 15 mg  15 mg Oral TID      SCHEDULED MEDICATIONS  Current Facility-Administered Medications    Medication Dose Route Frequency   ??? sodium chloride (NS) flush 5-10 mL  5-10 mL IntraVENous Q8H   ??? 0.9% sodium chloride infusion  100 mL/hr IntraVENous CONTINUOUS   ??? [START ON 12/02/2014] enoxaparin (LOVENOX) injection 40 mg  40 mg SubCUTAneous Q24H   ??? [START ON 12/02/2014] clopidogrel (PLAVIX) tablet 75 mg  75 mg Oral DAILY   ??? [START ON 12/02/2014] ARIPiprazole (ABILIFY) tablet 15 mg  15 mg Oral DAILY   ??? divalproex ER (DEPAKOTE ER) 24 hour tablet 1,000 mg  1,000 mg Oral QHS   ??? [START ON 12/02/2014] FLUoxetine (PROzac) capsule 20 mg  20 mg Oral  DAILY   ??? mirtazapine (REMERON) tablet 30 mg  30 mg Oral QHS   ??? [START ON 12/02/2014] atorvastatin (LIPITOR) tablet 40 mg  40 mg Oral DAILY   ??? carbidopa-levodopa (SINEMET) 25-100 mg per tablet 1 Tab  1 Tab Oral TID   ??? [START ON 12/02/2014] docusate sodium (COLACE) capsule 100 mg  100 mg Oral BID   ??? gabapentin (NEURONTIN) capsule 400 mg  400 mg Oral Q8H   ??? oxyCODONE IR (ROXICODONE) tablet 15 mg  15 mg Oral TID   ??? [START ON 12/02/2014] tiZANidine (ZANAFLEX) tablet 2 mg  2 mg Oral 7am                ASSESSMENT & PLAN          Case d/w nursing staff and hx obtained/plan reviewed with pt who gave verbal informed consent for treatment with medications as noted below. The patient Dale Weaver is a 64 y.o.  male who presents at this time for the following psychiatric diagnoses:    MDD  GAD  PTSD    Plan:  1. Restart all psych meds except for klonopin. Use ativan prn instead    Can be discharged home from psych stand point and can follow up as outpatient with his psychiatrist.    Will sign off. Please call if further input is needed.    Thank you for the opportunity to participate in the care of your patient.                        SIGNED:    Gwenlyn Saran, MD  12/01/2014

## 2014-12-01 NOTE — Progress Notes (Addendum)
[facility] SHIFT NURSING NOTE    1357 Verbal shift change report given by Sonia Baller RN (offgoing nurse) . Report included the following information SBAR, Kardex, ED Summary, [facility] and Cardiac Rhythm NSR.    1505 Patient arrived via stretcher.    Primary Nurse Harle Stanford, RN and Larene Beach, RN performed a dual skin assessment on this patient No impairment noted except small toe on left foot is red from "stubbing" per patient and a slight pinkish rash to inner thighs. Patient states he completed his course of mycostatin.   Braden score is 19  1625 Dr. Emilio Math in with patient.  1648 Due to MRI results and consult from Dr. Emilio Math (see note), per Dr. Drema Pry, cancel all tests related to stroke, UA and AC/HS accucheck, and speech eval, and any other work ups/plans related to stroke care. Ok to consult psychiatry.  1900 Bedside shift change report given to [employer] (oncoming nurse). Report included the following information SBAR, Kardex and MAR.     SHIFT SUMMARY:         Admission Date 12/01/2014   Admission Diagnosis Stroke (cerebrum) ([place])   Consults IP CONSULT TO NEUROLOGY        Consults   PT   OT   Speech   Case Management       Palliative     Hospice      Cardiac Monitoring Order   Yes   No      GI Prophylaxis  (Ex: Protonix, Pepcid, etc,.)   Yes   No          DVT Prophylaxis   SCDs:             Ted stockings:          Medication (Ex: Lovenox, Heparin, etc..)   Contraindicated   None         Activity Level           Purposeful Rounding every 1-2 hour?   Yes    Schmid Score  Total Score: 3   Bed Alarm (If score 3 or >)   Yes     Refused (See signed refusal form in chart)   Braden Score  Braden Score: 19   Braden Score (if score 14 or less)   PMT consult   Wound Care consult      Specialty bed    Nutrition consult          Needs prior to discharge:   Home O2 required:    Yes NO X    If yes, how much O2 required?    Other:             POST-OP SURGICAL VATS   Yes   N/A     Incentive Spirometer:   Yes   Refused    Coughing and deep breathing:     Yes   Refused   Oral care:     Yes   Refused   Understanding (patient education):     Yes   Refused   Getting out of bed Number times ambulated in hallway past shift:    Number of times OOB to chair past shift:     Head of bed elevation:     Yes   Refused      Influenza Vaccine Received Flu Vaccine for Current Season (usually Sept-March): Yes        Pneumonia Vaccine Pneumococcal Vaccine Received in the Past: Yes  Diet Active Orders   Diet    DIET NPO      LDAs               Peripheral IV 12/01/14 Left Arm (Active)   Site Assessment Clean, dry, & intact 12/01/2014  8:18 AM Phlebitis Assessment 0 12/01/2014  8:18 AM   Infiltration Assessment 0 12/01/2014  8:18 AM   Dressing Status Clean, dry, & intact 12/01/2014  8:18 AM   Dressing Type Tape;Transparent 12/01/2014  8:18 AM   Hub Color/Line Status Blue;Flushed 12/01/2014  8:18 AM                      Urinary Catheter      Intake & Output   Date 11/30/14 0700 - 12/01/14 0659(Not Admitted) 12/01/14 0700 - 12/02/14 0659   Shift 0700-1859 1900-0659 24 Hour Total 0700-1859 1900-0659 24 Hour Total   I  N  T  A  K  E   I.V.    1100  1100      I.V.    1100  1100    Shift Total  (mL/kg)    1100  (14.2)  1100  (14.2)   O  U  T  P  U  T   Shift Total  (mL/kg)         NET    1100  1100   Weight (kg)    77.3 77.3 77.3         Readmission Risk Assessment Tool Score Medium Risk            17       Total Score        3 Relationship with PCP    4 More than 1 Admission in calendar year    4 Patient Insurance is Medicare, Medicaid or Self Pay    6 [place]son Comorbidity Score        Criteria that do not apply:    Patient Living Status    Patient Length of Stay > 5       Expected Length of Stay - - -   Actual Length of Stay 0

## 2014-12-01 NOTE — ED Notes (Signed)
Patient arrives via EMS. EMS states patient went to bed around 11pm with a headache and woke up with right sided weakness, blurred vision in the right eye, and what patient describes at slurred speech. Patient resting on stretcher. Call bell in reach.

## 2014-12-01 NOTE — Consults (Signed)
Name:       Dale Weaver, Dale Weaver         Admitted:          12/01/2014                                         DOB:               [birth year]  Account #:  [id number]               Age:               64  Consultant: Geri Seminole, MD      Location                                CONSULTATION REPORT    DATE OF CONSULTATION           12/01/2014      REASON FOR CONSULTATION: TIA.    HISTORY OF PRESENT ILLNESS: This is a 64 year old pleasant white male with  known history of  CAD, came in with sudden onset of tingling and numbness  in the right arm and leg. It also involves his upper and lower face.  According to the patient, he has been taking his medication regularly.  Denies being under any stress recently.    PAST MEDICAL HISTORY: Significant for TIA, 02/06/2014.   The patient also  has a history of coronary artery disease, status post cardiac stent placed  in November last year. Also has known history of Parkinson disease.    PAST PSYCHIATRIC HISTORY: Significant for anxiety disorder.    HOME MEDICATIONS  The patient was taking:  1. One baby aspirin a day.  2. Klonopin.  3. Omeprazole.  4. Sinemet.  5. Remeron.  6. Gabapentin.    VITAL SIGNS: Stable.    PHYSICAL EXAMINATION  NEUROLOGIC: Fully awake, alert. Cranial nerves intact. The patient does  have a rest tremor with rigidity moderate to severe in all 4 extremities.  Strength is 5/5 proximally and distally in all 4 limbs. Sensory examination  shows no deficit to sharp stimuli applied to the face and extremities on  the left side. The patient does have decreased sensation to sharp stimuli  over right upper and lower face, right arm and leg.    MRI of the brain did not show any acute pathology. MRA of the brain and  neck did not show any flow-limiting stenosis.    DIAGNOSTIC IMPRESSION  1. Transient ischemic attack  2. Conversion, reaction. The patient's distribution of altered sensation is   somewhat known physiologic, however, given that the patient has history of  coronary artery disease and transient ischemic attack, I am discontinuing  his aspirin and start him on Plavix 75 mg once a day. Outpatient neurology  evaluation followup in 2-4 weeks.  2. As reiterated above, we will suggest to have a psychiatric evaluation if  possible while the patient is in the hospital.    Thank you very much for letting me participate in the care of this patient.                  Geri Seminole, MD    cc:  Geri Seminole, MD      MAB/wmx; D: 12/01/2014 04:35 P; T: 12/01/2014 05:22 P; DOC# [id number]; Job#  [id number]

## 2014-12-01 NOTE — H&P (Signed)
Hospitalist Admission Note    NAME: Dale Weaver   DOB:  04/24/1951   MRN:  [id number]     Date/Time:  12/01/2014 10:03 AM    Patient PCP: [facility]  ________________________________________________________________________   Assessment & Plan:  Acute ischemic stroke, POA  Hx TIA 01/2014 at [facility]  --right arm and leg weakness, right facial droop, loss of visual acuity in right eye.  Not candidate for tpa since woke up with symptoms and out of time frame  --did not pass swallow screen in ER.  NPO, stat speech evaluation.  Aspirin suppository  --in house neurology consult, echo, MRI/MRA brain and neck.  A1c, fasting lipid.  PT/OT consult.  --had implanted loop recorder -- no afib, recorder removed 06/2014     Parkinson's disease  --on sinemet -- hold while npo.  Resting tremor in left arm.    CAD s/p MI and stent  Hx CHF  --denies chest pain.  EKG nonischemic.  Aspirin suppository.  Echo 63/01 with diastolic dysfunction EF 60-10%, mild MR, mild to mod AR    Chronic back pain on chronic narcotic  Hx back surgery x 4  --morphine prn for now    Anxiety  Claustrophobia  --needs ativan premed for MRI.    Code:  Discussed full  mPOA Zadie Cleverly [phone]  DVT prophylaxis:  lovenox to start tomorrow    Attempted med reconciliation.  Meds entered but dose unknown and provided mPOA with contact numbers to call with info when return home.        Subjective:   CHIEF COMPLAINT:  Right sided weakness and numbness, headache    HISTORY OF PRESENT ILLNESS:     Dale Weaver is a 64 y.o.  Right handed Caucasian male with hx TIA and Parkinson's disease who had headache yesterday evening, took Aleve, went to bed at 11pm.  Woke up at 6:15am with right face, arm, leg numbness and weakness, blurred vision in right eye, right face "twisted", crying.  POA said speech was garbled and patient was pale; both of which have improved.    We were asked to admit for work up and evaluation of the above problems.      Past Medical History   Diagnosis Date   ??? Back pain      chronic   ??? Heart failure (HCC)      CHF   ??? CAD (coronary artery disease)      MI 2008   ??? Psychiatric disorder      anxiety   ??? Hiatal hernia    ??? TIA (transient ischemic attack) 01/2014   ??? Parkinson's disease [facility])       Past Surgical History   Procedure Laterality Date   ??? Hx orthopaedic       back surgery x4, last June 2010   ??? Hx appendectomy     ??? Hx cholecystectomy     ??? Hx hernia repair     ??? Pr cardiac surg procedure unlist       cardiac cath August 2009   ??? Pr cardiac surg procedure unlist  2010     x 1 stent, cardiologist @ [facility]   ??? Pr cardiac surg procedure unlist       insertion of loop recorder removed 06/2014     History   Substance Use Topics   ??? Smoking status: Never Smoker    ??? Smokeless tobacco: Not on file   ??? Alcohol Use: No  Drug use: denies   Living situation:  Live with guardian/mPOA and nurse    Family History   Problem Relation Age of Onset   ??? Stroke Father    ??? Hypertension Mother      Allergies   Allergen Reactions   ??? Seafood [Shellfish Containing Products] Hives     Allergy to fish only.  Can eat shrimp.  No allergy to IV contrast.   ??? Sulfa (Sulfonamide Antibiotics) Hives   ??? Zoloft [Sertraline] Itching        Prior to Admission medications    Medication Sig Start Date End Date Taking? Authorizing Provider   clonazePAM (KLONOPIN) 1 mg tablet Take 1 mg by mouth two (2) times a day.   Yes Phys Other, MD   omeprazole (PRILOSEC) 10 mg capsule Take 10 mg by mouth two (2) times a day.   Yes Phys Other, MD   HYDROcodone-acetaminophen (NORCO) 7.5-325 mg per tablet Take 1 Tab by mouth every six (6) hours as needed for Pain. Max Daily Amount: 4 Tabs. 06/01/14  Yes Jerilynn Birkenhead, MD   CARBIDOPA PO Take  by mouth.   Yes Phys Other, MD   aspirin delayed-release 81 mg tablet Take 1 Tab by mouth daily. 01/22/14  Yes Jac Canavan, MD   mirtazapine (REMERON) 15 mg tablet Take 10 mg by mouth nightly.   Yes Phys Other, MD    carbidopa-levodopa (SINEMET) 10-100 mg per tablet Take 2 Tabs by mouth two (2) times a day.   Yes Historical Provider   GABAPENTIN, BULK, 400 mg by Does Not Apply route three (3) times daily.   Yes Phys Other, MD   FLUOXETINE HCL (PROZAC PO) Take  by mouth.     Yes Phys Other, MD   ARIPIPRAZOLE (ABILIFY PO) Take  by mouth.     Yes Phys Other, MD     REVIEW OF SYSTEMS:  POSITIVE= Bold.  Negative = normal text  General:  fever, chills, sweats, generalized weakness, weight loss/gain, loss of appetite  Eyes:  blurred vision right, eye pain, loss of vision, diplopia  Ear Nose and Throat:  rhinorrhea, pharyngitis  Respiratory:   cough, sputum production, SOB, wheezing, DOE, pleuritic pain  Cardiology:  chest pain, palpitations, orthopnea, PND, edema, syncope   Gastrointestinal:  abdominal pain, N/V, dysphagia, diarrhea, constipation, bleeding  Genitourinary:  frequency, urgency, dysuria, hematuria, incontinence  Muskuloskeletal :  arthralgia, myalgia  Hematology:  easy bruising, bleeding, lymphadenopathy  Dermatological:  rash, ulceration, pruritis  Endocrine:  hot flashes or polydipsia  Neurological:  headache, dizziness, confusion, focal weakness, paresthesia, memory loss, gait disturbance  Psychological: anxiety, depression, agitation      Objective:   VITALS:    Visit Vitals   Item Reading   ??? BP 108/64 mmHg   ??? Pulse 63   ??? Temp 97.9 ??F (36.6 ??C)   ??? Resp 29   ??? Ht 6' (1.829 m)   ??? Wt 77.3 kg (170 lb 6.7 oz)   ??? BMI 23.11 kg/m2   ??? SpO2 96%     Temp (24hrs), Avg:97.9 ??F (36.6 ??C), Min:97.9 ??F (36.6 ??C), Max:97.9 ??F (36.6 ??C)    Body mass index is 23.11 kg/(m^2).  PHYSICAL EXAM:    General:    Alert, cooperative, no distress, appears stated age.     HEENT: Atraumatic, anicteric sclerae, pink conjunctivae     No oral ulcers, mucosa moist, throat clear.  Hearing intact.  Neck:  Supple, symmetrical,  thyroid: non tender.  No bruit  Lungs:   Clear to auscultation bilaterally.  No Wheezing or Rhonchi. No rales.   Chest wall:  No tenderness  No Accessory muscle use.  Heart:   Regular  rhythm,  2/6 systolic  murmur   No gallop.  No edema  Abdomen:   Soft, non-tender. Not distended.  Bowel sounds normal. No masses  Extremities: No cyanosis.  No clubbing  Skin:     Not pale Not Jaundiced  No rashes   Psych:  Good insight.  Not depressed.  Not anxious or agitated.  Neurologic: EOMs intact. Mild right facial droop.  Speech slightly dysarthric.  Resting left hand tremor.  Left arm/leg 5/5.  +right arm drift, right arm 4/5 with decreased grip, right leg 3/5.  Alert and oriented X 3.     IMAGING RESULTS:          I have personally reviewed the actual        CXR       CT scan  CXR:  CT :  EKG:  SR 62 o/w normal   ________________________________________________________________________  Care Plan discussed with:    Comments   Patient x    Family  x Guardian and personal nurse   RN x    Care Manager                    Consultant:  x Dr. Albertina Parr   ________________________________________________________________________  Prophylaxis:  GI none   DVT lovenox   ________________________________________________________________________  Recommended Disposition:   Home with Family    HH/PT/OT/RN    SNF/LTC    SAHR x   ________________________________________________________________________  Code Status:  Full Code y   DNR/DNI    ________________________________________________________________________  TOTAL TIME: 60 minutes    ________________________________________________________________________  Nolon Nations, MD      Procedures: see electronic medical records for all procedures/Xrays and details which were not copied into this note but were reviewed prior to creation of Plan.    LAB DATA REVIEWED:    Recent Results (from the past 24 hour(s))   GLUCOSE, POC    Collection Time: 12/01/14  7:35 AM   Result Value Ref Range    Glucose (POC) 106 (H) 65 - 100 mg/dL    Performed by Lucienne Capers    METABOLIC PANEL, COMPREHENSIVE     Collection Time: 12/01/14  7:37 AM   Result Value Ref Range    Sodium 148 (H) 136 - 145 mmol/L    Potassium 3.4 (L) 3.5 - 5.1 mmol/L    Chloride 112 (H) 97 - 108 mmol/L    CO2 28 21 - 32 mmol/L    Anion gap 8 5 - 15 mmol/L    Glucose 94 65 - 100 mg/dL    BUN 21 (H) 6 - 20 MG/DL    Creatinine 0.80 0.70 - 1.30 MG/DL    BUN/Creatinine ratio 26 (H) 12 - 20      GFR est AA >60 >60 ml/min/1.51m    GFR est non-AA >60 >60 ml/min/1.778m   Calcium 8.6 8.5 - 10.1 MG/DL    Bilirubin, total 0.6 0.2 - 1.0 MG/DL    ALT 74 12 - 78 U/L    AST 22 15 - 37 U/L    Alk. phosphatase 101 45 - 117 U/L    Protein, total 6.2 (L) 6.4 - 8.2 g/dL    Albumin 3.3 (L) 3.5 - 5.0 g/dL    Globulin 2.9 2.0 - 4.0 g/dL  A-G Ratio 1.1 1.1 - 2.2     PROTHROMBIN TIME + INR    Collection Time: 12/01/14  7:37 AM   Result Value Ref Range    INR 1.2 (H) 0.9 - 1.1      Prothrombin time 12.0 (H) 9.0 - 11.1 sec   PTT    Collection Time: 12/01/14  7:37 AM   Result Value Ref Range    aPTT 20.0 (L) 22.1 - 32.5 sec    aPTT, therapeutic range     58.0 - 77.0 SECS   EKG, 12 LEAD, INITIAL    Collection Time: 12/01/14  7:49 AM   Result Value Ref Range    Ventricular Rate 62 BPM    Atrial Rate 62 BPM    P-R Interval 174 ms    QRS Duration 100 ms    Q-T Interval 440 ms    QTC Calculation (Bezet) 446 ms    Calculated P Axis 55 degrees    Calculated R Axis 58 degrees    Calculated T Axis 58 degrees    Diagnosis       Normal sinus rhythm  When compared with ECG of 07-Jun-2014 16:41,  No significant change was found  Confirmed by Rohatgi, Sameer [phone]) on 12/01/2014 9:49:31 AM     CBC WITH AUTOMATED DIFF    Collection Time: 12/01/14  8:00 AM   Result Value Ref Range    WBC 4.1 4.1 - 11.1 K/uL    RBC 3.95 (L) 4.10 - 5.70 M/uL    HGB 12.7 12.1 - 17.0 g/dL    HCT 37.3 36.6 - 50.3 %    MCV 94.4 80.0 - 99.0 FL    MCH 32.2 26.0 - 34.0 PG    MCHC 34.0 30.0 - 36.5 g/dL    RDW 13.3 11.5 - 14.5 %    PLATELET 123 (L) 150 - 400 K/uL    NEUTROPHILS 62 32 - 75 %     LYMPHOCYTES 29 12 - 49 %    MONOCYTES 7 5 - 13 %    EOSINOPHILS 2 0 - 7 %    BASOPHILS 0 0 - 1 %    ABS. NEUTROPHILS 2.6 1.8 - 8.0 K/UL    ABS. LYMPHOCYTES 1.2 0.8 - 3.5 K/UL    ABS. MONOCYTES 0.3 0.0 - 1.0 K/UL    ABS. EOSINOPHILS 0.1 0.0 - 0.4 K/UL    ABS. BASOPHILS 0.0 0.0 - 0.1 K/UL   CK W/ CKMB & INDEX    Collection Time: 12/01/14  8:15 AM   Result Value Ref Range    CK 37 (L) 39 - 308 U/L    CK - MB 1.0 0.5 - 3.6 NG/ML    CK-MB Index 2.7 (H) 0 - 2.5     TROPONIN I    Collection Time: 12/01/14  8:15 AM   Result Value Ref Range    Troponin-I, Qt. <0.04 <0.05 ng/mL

## 2014-12-01 NOTE — ED Notes (Signed)
POC BS 101    Patient resting comfortably on stretcher with eyes closed. Patient continues to report numbness to R side and weakness to R side. IVF and IV Potassium continue to infuse.

## 2014-12-01 NOTE — ED Notes (Signed)
Patient didn't pass STAND scale at this time. Patient and care taker updated on need to remain NPO at this time. Dr Alfonse Spruce aware.     Patient aware that medications will be administered after evaluation by MD.

## 2014-12-01 NOTE — ED Notes (Signed)
Patient with complaints of "burning" at IV site.   Patient receiving IV Potassium at this time Rate decreased from 100 ml/hr to 50 ml/hr.

## 2014-12-01 NOTE — ED Notes (Signed)
Consult call placed to Neuro office. Spoke with [employer]

## 2014-12-01 NOTE — Progress Notes (Signed)
Pharmacy Medication Reconciliation     Recommendations/Findings:   1) added to med list - divalproex, docusate, gabapentin, lubricating eye drop, miconazole, mirtazapine, oxycodone, tizanidine, tramadol, tylenol, atorvastatin, vitamin d, vitamin b 12  2) changes made to - abilify, sinemet, prozac, gabapentin, remeron, prilosec  3) removed from med list - klonopin, norco      -Clarified PTA med list with family membver. PTA medication list was corrected to the following:     Prior to Admission Medications   Prescriptions Last Dose Informant Patient Reported? Taking?   ARIPiprazole (ABILIFY) 30 mg tablet 11/30/2014 at Unknown time  Yes Yes   Sig: Take 15 mg by mouth daily.   CARBOXYMETHYLCELLULOS/GLYCERIN (LUBRICATING DROPS OP)   Yes Yes   Sig: Apply  to eye nightly.   FLUoxetine (PROZAC) 20 mg tablet 11/30/2014 at Unknown time  Yes Yes   Sig: Take 20 mg by mouth daily. acetaminophen (TYLENOL) 500 mg tablet   Yes Yes   Sig: Take 500 mg by mouth every six (6) hours as needed for Pain.   aspirin delayed-release 81 mg tablet 11/30/2014 at Unknown time  No Yes   Sig: Take 1 Tab by mouth daily.   atorvastatin (LIPITOR) 80 mg tablet   Yes Yes   Sig: Take 40 mg by mouth daily.   bisacodyl (DULCOLAX) 10 mg suppository   Yes Yes   Sig: Insert 10 mg into rectum daily as needed.   carbidopa-levodopa (SINEMET) 25-100 mg per tablet 11/30/2014 at Unknown time  Yes Yes   Sig: Take 1 Tab by mouth three (3) times daily.   cholecalciferol (VITAMIN D3) 1,000 unit tablet   Yes Yes   Sig: Take 1,000 Units by mouth three (3) times daily.   cyanocobalamin (VITAMIN B12) 1,000 mcg/mL injection   Yes Yes   Sig: 1,000 mcg by IntraMUSCular route every thirty (30) days.   divalproex ER (DEPAKOTE ER) 500 mg ER tablet 11/30/2014 at Unknown time  Yes Yes   Sig: Take 1,000 mg by mouth nightly.   docusate sodium (COLACE) 100 mg capsule   Yes Yes   Sig: Take 100 mg by mouth two (2) times a day.    gabapentin (NEURONTIN) 400 mg capsule 11/30/2014 at Unknown time  Yes Yes   Sig: Take 400 mg by mouth every eight (8) hours.   miconazole (MICOTIN) 2 % topical powder   Yes Yes   Sig: Apply  to affected area daily.   mirtazapine (REMERON) 30 mg tablet   Yes Yes   Sig: Take 30 mg by mouth nightly.   omeprazole (PRILOSEC) 20 mg capsule   Yes Yes   Sig: Take 20 mg by mouth two (2) times a day.   oxyCODONE IR (OXY-IR) 15 mg immediate release tablet 11/30/2014 at Unknown time  Yes Yes   Sig: Take 15 mg by mouth three (3) times daily.   tiZANidine (ZANAFLEX) 2 mg tablet   Yes Yes   Sig: Take 2 mg by mouth every morning. 1 tab in AM and 2 tab bedtime   tiZANidine (ZANAFLEX) 2 mg tablet   Yes Yes   Sig: Take 4 mg by mouth nightly. 1 tab in AM and 2 tabs bedtime   traMADol (ULTRAM) 50 mg tablet   Yes Yes   Sig: Take 50 mg by mouth three (3) times daily as needed for Pain.      Facility-Administered Medications: None        Thank you,  Clarene Reamer, PHARMD

## 2014-12-01 NOTE — ED Notes (Signed)
Patient medicated as ordered Lights dimmed for comfort. Patient's guardian has gone home at this time to retrieve medications

## 2014-12-01 NOTE — ED Provider Notes (Signed)
HPI Comments: Dale Weaver is a 64 y.o. R-dominant male with PMHx significant for TIA and Parkinson's presenting via EMS to the ED c/o 9/10 acute onset, gradually worsening HA since going to sleep last night as well as R-sided numbness of the face, upper and lower extremity with weakness since waking up this morning around 0615. Pt also c/o R-sided visual disturbance as well as speech difficulty. He notes associated nausea and slight photophobia. He reports slight SOB, noting that he is rx'd 2L O2 at night secondary to historical PNA. He reports that the numbness "feels like novocain" and involves the R side of his face. He describes HA as frontal and described as pressure, noting that it started last night as a 6/10 and is currently a 9/10 or 10/10. Pt reports TIA June, 2015 which resolved with medical intervention, noting that he is followed by neurology at the [facility]. He denies any hx of migraines. He denies any facial droop, F/C, or vomiting.    PCP: Phys Other, MD    There are no other complaints, changes, or physical findings at this time.     The history is provided by the patient.        Past Medical History:   Diagnosis Date   ??? Back pain    ??? Heart failure (HCC)      CHF   ??? CAD (coronary artery disease)      cardiac blockage   ??? Psychiatric disorder      anxiety   ??? Hiatal hernia    ??? TIA (transient ischemic attack)    ??? Parkinson's disease [facility])        Past Surgical History:   Procedure Laterality Date   ??? Hx orthopaedic       back surgery x3, last June 2010   ??? Hx appendectomy     ??? Hx cholecystectomy     ??? Hx hernia repair     ??? Pr cardiac surg procedure unlist       cardiac cath August 2009   ??? Pr cardiac surg procedure unlist       x 1 stent, [facility] @ [facility]   ??? Pr cardiac surg procedure unlist       insertion of loop recorder-no MRI         History reviewed. No pertinent family history.    History     Social History   ??? Marital Status: WIDOWED     Spouse Name: N/A   ??? Number of Children: N/A    ??? Years of Education: N/A     Occupational History   ??? Not on file.     Social History Main Topics   ??? Smoking status: Never Smoker    ??? Smokeless tobacco: Not on file   ??? Alcohol Use: No   ??? Drug Use: No   ??? Sexual Activity: Not on file     Other Topics Concern   ??? Not on file     Social History Narrative           ALLERGIES: Seafood; Sulfa (sulfonamide antibiotics); and Zoloft      Review of Systems   Constitutional: Negative for fever, chills, diaphoresis and unexpected weight change.   HENT: Negative for rhinorrhea and sore throat.    Eyes: Positive for photophobia and visual disturbance (R-sided). Negative for pain.   Respiratory: Positive for shortness of breath (chronic). Negative for wheezing and stridor.    Cardiovascular: Negative for chest pain and  leg swelling.   Gastrointestinal: Positive for nausea. Negative for vomiting, abdominal pain, diarrhea and blood in stool.   Genitourinary: Negative for dysuria, flank pain and difficulty urinating.   Musculoskeletal: Negative for back pain and neck stiffness.   Skin: Negative for rash.   Neurological: Positive for speech difficulty, weakness (R-sided), numbness (R-sided) and headaches. Negative for seizures, syncope and light-headedness.   Psychiatric/Behavioral: Negative for confusion.       Filed Vitals:    12/01/14 0704 12/01/14 0800   BP: 120/63 115/57   Pulse: 67 66   Temp: 97.9 ??F (36.6 ??C)    Resp: 20 26   Height: 6' (1.829 m)    Weight: 77.3 kg (170 lb 6.7 oz)    SpO2: 97% 97%            Physical Exam   Constitutional: He is oriented to person, place, and time. He appears well-developed and well-nourished. No distress.   HENT:   Nose: Nose normal.   Mouth/Throat: Oropharynx is clear and moist. No oropharyngeal exudate.   Eyes: Conjunctivae and EOM are normal. Pupils are equal, round, and reactive to light. No scleral icterus.   Neck: Normal range of motion. Neck supple. No JVD present.    Cardiovascular: Normal rate, regular rhythm, normal heart sounds and intact distal pulses.    No murmur heard.  Pulses:       Dorsalis pedis pulses are 2+ on the right side, and 2+ on the left side.   Pulmonary/Chest: Effort normal and breath sounds normal. No stridor. No respiratory distress. He has no wheezes. He has no rales.   Abdominal: Soft. Bowel sounds are normal. He exhibits no distension. There is no tenderness. There is no rebound and no guarding.   Musculoskeletal: He exhibits no edema or tenderness.   Bunion on R foot.   Neurological: He is alert and oriented to person, place, and time. He displays no atrophy. A sensory deficit is present. He exhibits normal muscle tone.   Reflex Scores:       Bicep reflexes are 2+ on the right side and 2+ on the left side.       Patellar reflexes are 2+ on the right side and 2+ on the left side.  Resting tremor; mild R-sided facial droop; tongue deviation to the R; 5/5 strength L upper and lower extremities, 4/5 strength R upper and lower extremities; decreased sensation throughout R upper and lower extremity; decreased sensation to R side of face;  Appears to have some R visual field deficits   Skin: Skin is warm and dry. He is not diaphoretic.   Psychiatric: He has a normal mood and affect.   Nursing note and vitals reviewed.       MDM  Number of Diagnoses or Management Options  Cerebrovascular accident (CVA) due to embolism of cerebral artery [facility]):      Amount and/or Complexity of Data Reviewed  Clinical lab tests: ordered and reviewed  Tests in the radiology section of CPT??: ordered and reviewed  Tests in the medicine section of CPT??: ordered and reviewed  Review and summarize past medical records: yes  Discuss the patient with other providers: yes (Hospitalist (Dr. Alfonse Spruce))  Independent visualization of images, tracings, or specimens: yes    Patient Progress  Patient progress: stable      Procedures    PROGRESS NOTE:  7:36 AM   No tPA consult secondary to timing of sxs.     EKG interpretation: (Preliminary)  [phone]  Rhythm: normal sinus rhythm; and regular . Rate (approx.): 62; Axis: normal; P wave: normal; QRS interval: normal ; ST/T wave: normal; non-ischemic.      CONSULT NOTE:   8:59 AM  Reynaldo Minium, MD spoke with Dr. Malachi Paradise,   Specialty: Hospitalist  Discussed pt's hx, disposition, and available diagnostic and imaging results. Reviewed care plans. Consultant will evaluate pt for admission.    LABORATORY TESTS:  Recent Results (from the past 12 hour(s))   GLUCOSE, POC    Collection Time: 12/01/14  7:35 AM   Result Value Ref Range    Glucose (POC) 106 (H) 65 - 100 mg/dL    Performed by Lucienne Capers    METABOLIC PANEL, COMPREHENSIVE    Collection Time: 12/01/14  7:37 AM   Result Value Ref Range    Sodium 148 (H) 136 - 145 mmol/L    Potassium 3.4 (L) 3.5 - 5.1 mmol/L    Chloride 112 (H) 97 - 108 mmol/L    CO2 28 21 - 32 mmol/L    Anion gap 8 5 - 15 mmol/L    Glucose 94 65 - 100 mg/dL    BUN 21 (H) 6 - 20 MG/DL    Creatinine 0.80 0.70 - 1.30 MG/DL    BUN/Creatinine ratio 26 (H) 12 - 20      GFR est AA >60 >60 ml/min/1.69m2    GFR est non-AA >60 >60 ml/min/1.19m2    Calcium 8.6 8.5 - 10.1 MG/DL    Bilirubin, total 0.6 0.2 - 1.0 MG/DL    ALT 74 12 - 78 U/L    AST 22 15 - 37 U/L    Alk. phosphatase 101 45 - 117 U/L    Protein, total 6.2 (L) 6.4 - 8.2 g/dL    Albumin 3.3 (L) 3.5 - 5.0 g/dL    Globulin 2.9 2.0 - 4.0 g/dL    A-G Ratio 1.1 1.1 - 2.2     PROTHROMBIN TIME + INR    Collection Time: 12/01/14  7:37 AM   Result Value Ref Range    INR 1.2 (H) 0.9 - 1.1      Prothrombin time 12.0 (H) 9.0 - 11.1 sec   PTT    Collection Time: 12/01/14  7:37 AM   Result Value Ref Range    aPTT 20.0 (L) 22.1 - 32.5 sec    aPTT, therapeutic range     58.0 - 77.0 SECS   EKG, 12 LEAD, INITIAL    Collection Time: 12/01/14  7:49 AM   Result Value Ref Range    Ventricular Rate 62 BPM    Atrial Rate 62 BPM    P-R Interval 174 ms    QRS Duration 100 ms     Q-T Interval 440 ms    QTC Calculation (Bezet) 446 ms    Calculated P Axis 55 degrees    Calculated R Axis 58 degrees    Calculated T Axis 58 degrees    Diagnosis       Normal sinus rhythm  When compared with ECG of 07-Jun-2014 16:41,  T wave amplitude has decreased in Lateral leads     CBC WITH AUTOMATED DIFF    Collection Time: 12/01/14  8:00 AM   Result Value Ref Range    WBC 4.1 4.1 - 11.1 K/uL    RBC 3.95 (L) 4.10 - 5.70 M/uL    HGB 12.7 12.1 - 17.0 g/dL    HCT 37.3 36.6 - 50.3 %  MCV 94.4 80.0 - 99.0 FL    MCH 32.2 26.0 - 34.0 PG    MCHC 34.0 30.0 - 36.5 g/dL    RDW 79.9 87.2 - 15.8 %    PLATELET 123 (L) 150 - 400 K/uL    NEUTROPHILS 62 32 - 75 %    LYMPHOCYTES 29 12 - 49 %    MONOCYTES 7 5 - 13 %    EOSINOPHILS 2 0 - 7 %    BASOPHILS 0 0 - 1 %    ABS. NEUTROPHILS 2.6 1.8 - 8.0 K/UL    ABS. LYMPHOCYTES 1.2 0.8 - 3.5 K/UL    ABS. MONOCYTES 0.3 0.0 - 1.0 K/UL    ABS. EOSINOPHILS 0.1 0.0 - 0.4 K/UL    ABS. BASOPHILS 0.0 0.0 - 0.1 K/UL   CK W/ CKMB & INDEX    Collection Time: 12/01/14  8:15 AM   Result Value Ref Range    CK 37 (L) 39 - 308 U/L    CK - MB 1.0 0.5 - 3.6 NG/ML    CK-MB Index 2.7 (H) 0 - 2.5     TROPONIN I    Collection Time: 12/01/14  8:15 AM   Result Value Ref Range    Troponin-I, Qt. <0.04 <0.05 ng/mL       IMAGING RESULTS:  ?? CT HEAD WO CONT (Final result) Result time: 12/01/14 07:52:24   ?? Final result by Rad Results In Edi (12/01/14 07:52:24)   ?? Narrative:   ?? **Final Report**  ??    ICD Codes / Adm.Diagnosis: 160179 ?? / Extremity Weakness ??altered mental   status  Examination: ??CT HEAD WO CON ??- [id number] - Dec 01 2014 ??7:30AM  Accession No: ??[id number]  Reason: ??Stroke > 8 hrs      REPORT:  Indication: Right extremity weakness, slurred speech and altered mental   status after going to bed with headache last night    Comparison to 01/21/2014    Multiple axial images were obtained from the skull base to the vertex   without the use of intravenous contrast. Ventricles and sulci are normal for    patient's age. There is no midline shift or herniation. The examination is   negative for acute infarct, mass lesion, or hemorrhage. The visualized   portions of the petrous temporal bones, paranasal sinuses, and orbits are   unremarkable.   ????    IMPRESSION: No acute process.  ????    ??  ??  Signing/Reading Doctor: Gordan Payment [phone]) ??  Approved: Gordan Payment [phone]) ??Dec 01 2014 ??7:50AM ?? ?? ?? ?? ?? ?? ?? ?? ?? ??          MEDICATIONS GIVEN:  Medications   ondansetron (ZOFRAN) injection 4 mg (not administered)   morphine injection 4 mg (not administered)       IMPRESSION:  1. Cerebrovascular accident (CVA) due to embolism of cerebral artery (HCC)        PLAN:  1. Admit to hospitalist    ADMIT NOTE:  9:15 AM  Patient is being admitted to the hospital by Dr. Georgianne Fick. The results of their tests and reasons for their admission have been discussed with the patient and/or available family. They convey agreement and understanding for the need to be admitted and for their admission diagnosis.     ATTESTATION:  This note is prepared by Coralee North, acting as Scribe for Mickel Crow, MD.    Mickel Crow, MD: The  scribe's documentation has been prepared under my direction and personally reviewed by me in its entirety. I confirm that the note above accurately reflects all work, treatment, procedures, and medical decision making performed by me.

## 2014-12-01 NOTE — ED Notes (Signed)
Patient's care taker now bedside. Patient and care taker updated on plan with pending CT scan and pending labs for further plan of care.

## 2014-12-01 NOTE — Progress Notes (Addendum)
Bedside and Verbal shift change report given to Claiborne Billings, Therapist, sports (oncoming nurse) by Junie Panning, RN (offgoing nurse). Report included the following information SBAR, Kardex, MAR, Recent Results and Cardiac Rhythm NSR.

## 2014-12-01 NOTE — ED Notes (Signed)
MRI screening form faxed to MRI. Patient with stand-by Ativan on order for MRI.

## 2014-12-01 NOTE — ED Notes (Addendum)
Dr Albertina Parr bedside evaluating patient.     TRANSFER - IN REPORT:    Verbal report received from Lynnell Dike (name) on Dale Weaver.  Report consisted of patient???s Situation, Background, Assessment and   Recommendations(SBAR).   Information from the following report(s) SBAR and ED Summary was reviewed with the receiving nurse.  Opportunity for questions and clarification was provided.      Assumed care of patient currently resting on stretcher   Patient states he went to bed last night around 2300 with 6/10 headache States he awoke this am with 9/10 headache R frontal Patient with hx of TIA. Patient states that he feels numbness to R side and reports weakness to R side. Patient denies any CP or SOB Patient with hx of Parkinson's Reports tremors are baseline Patient also reports some "blurred" vision to R eye.   Patient on monitor x 3 Updated on plan with pending CT scan and labs for further plan of care.

## 2014-12-01 NOTE — ED Notes (Signed)
Hospitalist Dr Alfonse Spruce bedside evaluating patient.

## 2014-12-02 LAB — CBC WITH AUTOMATED DIFF
ABS. BASOPHILS: 0 10*3/uL (ref 0.0–0.1)
ABS. EOSINOPHILS: 0.2 10*3/uL (ref 0.0–0.4)
ABS. LYMPHOCYTES: 1.4 10*3/uL (ref 0.8–3.5)
ABS. MONOCYTES: 0.2 10*3/uL (ref 0.0–1.0)
ABS. NEUTROPHILS: 2.1 10*3/uL (ref 1.8–8.0)
BASOPHILS: 0 % (ref 0–1)
EOSINOPHILS: 4 % (ref 0–7)
HCT: 32 % — ABNORMAL LOW (ref 36.6–50.3)
HGB: 10.8 g/dL — ABNORMAL LOW (ref 12.1–17.0)
LYMPHOCYTES: 37 % (ref 12–49)
MCH: 31.8 PG (ref 26.0–34.0)
MCHC: 33.8 g/dL (ref 30.0–36.5)
MCV: 94.1 FL (ref 80.0–99.0)
MONOCYTES: 6 % (ref 5–13)
NEUTROPHILS: 53 % (ref 32–75)
PLATELET: 103 10*3/uL — ABNORMAL LOW (ref 150–400)
RBC: 3.4 M/uL — ABNORMAL LOW (ref 4.10–5.70)
RDW: 13.5 % (ref 11.5–14.5)
WBC: 3.9 10*3/uL — ABNORMAL LOW (ref 4.1–11.1)

## 2014-12-02 LAB — METABOLIC PANEL, BASIC
Anion gap: 8 mmol/L (ref 5–15)
BUN/Creatinine ratio: 25 — ABNORMAL HIGH (ref 12–20)
BUN: 17 MG/DL (ref 6–20)
CO2: 26 mmol/L (ref 21–32)
Calcium: 7.8 MG/DL — ABNORMAL LOW (ref 8.5–10.1)
Chloride: 113 mmol/L — ABNORMAL HIGH (ref 97–108)
Creatinine: 0.67 MG/DL — ABNORMAL LOW (ref 0.70–1.30)
GFR est AA: >60 mL/min/{1.73_m2} (ref >60)
GFR est non-AA: >60 mL/min/{1.73_m2} (ref >60)
Glucose: 83 mg/dL (ref 65–100)
Potassium: 3.7 mmol/L (ref 3.5–5.1)
Sodium: 147 mmol/L — ABNORMAL HIGH (ref 136–145)

## 2014-12-02 LAB — LIPID PANEL
CHOL/HDL Ratio: 2 (ref 0–5.0)
Cholesterol, total: 55 MG/DL (ref <200)
HDL Cholesterol: 27 MG/DL
LDL, calculated: 16.6 MG/DL (ref 0–100)
Triglyceride: 57 MG/DL (ref <150)
VLDL, calculated: 11.4 MG/DL

## 2014-12-02 LAB — MAGNESIUM: Magnesium: 1.9 mg/dL (ref 1.6–2.4)

## 2014-12-02 LAB — PHOSPHORUS: Phosphorus: 3.8 MG/DL (ref 2.6–4.7)

## 2014-12-02 LAB — HEMOGLOBIN A1C WITH EAG
Est. average glucose: 103 mg/dL
Hemoglobin A1c: 5.2 % (ref 4.2–6.3)

## 2014-12-02 MED ORDER — ASPIRIN 81 MG TAB, DELAYED RELEASE
81 mg | Freq: Every day | ORAL | Status: DC
Start: 2014-12-02 — End: 2014-12-01

## 2014-12-02 MED ORDER — CLOPIDOGREL 75 MG TAB
75 mg | ORAL_TABLET | Freq: Every day | ORAL | Status: DC
Start: 2014-12-02 — End: 2015-07-25

## 2014-12-02 MED ORDER — CARBIDOPA-LEVODOPA 25 MG-100 MG TAB
25-100 mg | Freq: Three times a day (TID) | ORAL | Status: DC
Start: 2014-12-02 — End: 2014-12-03
  Administered 2014-12-02 – 2014-12-03 (×5): via ORAL

## 2014-12-02 MED ORDER — TIZANIDINE 4 MG TAB
4 mg | Freq: Every evening | ORAL | Status: DC
Start: 2014-12-02 — End: 2014-12-03
  Administered 2014-12-02 – 2014-12-03 (×2): via ORAL

## 2014-12-02 MED ORDER — TIZANIDINE 2 MG TAB
2 mg | ORAL | Status: DC
Start: 2014-12-02 — End: 2014-12-03
  Administered 2014-12-02 – 2014-12-03 (×2): via ORAL

## 2014-12-02 MED ORDER — GABAPENTIN 100 MG CAP
100 mg | Freq: Three times a day (TID) | ORAL | Status: DC
Start: 2014-12-02 — End: 2014-12-03
  Administered 2014-12-02 – 2014-12-03 (×6): via ORAL

## 2014-12-02 MED ORDER — DOCUSATE SODIUM 100 MG CAP
100 mg | Freq: Two times a day (BID) | ORAL | Status: DC
Start: 2014-12-02 — End: 2014-12-03
  Administered 2014-12-02 – 2014-12-03 (×3): via ORAL

## 2014-12-02 MED ORDER — ATORVASTATIN 40 MG TAB
40 mg | Freq: Every day | ORAL | Status: DC
Start: 2014-12-02 — End: 2014-12-03
  Administered 2014-12-02 – 2014-12-03 (×2): via ORAL

## 2014-12-02 MED ORDER — OXYCODONE 5 MG TAB
5 mg | Freq: Three times a day (TID) | ORAL | Status: DC
Start: 2014-12-02 — End: 2014-12-03
  Administered 2014-12-02 – 2014-12-03 (×4): via ORAL

## 2014-12-02 MED ORDER — ARTIFICIAL TEARS (DEXTRAN 70-HYPROMELLOSE) 0.1 %-0.3 % EYE DROPS
Freq: Every evening | OPHTHALMIC | Status: DC
Start: 2014-12-02 — End: 2014-12-03

## 2014-12-02 MED FILL — GABAPENTIN 300 MG CAP: 300 mg | ORAL | Qty: 1

## 2014-12-02 MED FILL — BD POSIFLUSH NORMAL SALINE 0.9 % INJECTION SYRINGE: INTRAMUSCULAR | Qty: 10

## 2014-12-02 MED FILL — MORPHINE 2 MG/ML INJECTION: 2 mg/mL | INTRAMUSCULAR | Qty: 1

## 2014-12-02 MED FILL — CARBIDOPA-LEVODOPA 25 MG-100 MG TAB: 25-100 mg | ORAL | Qty: 1

## 2014-12-02 MED FILL — LORAZEPAM 0.5 MG TAB: 0.5 mg | ORAL | Qty: 1

## 2014-12-02 MED FILL — DEPAKOTE ER 250 MG TABLET,EXTENDED RELEASE: 250 mg | ORAL | Qty: 4

## 2014-12-02 MED FILL — CLOPIDOGREL 75 MG TAB: 75 mg | ORAL | Qty: 1

## 2014-12-02 MED FILL — DOK 100 MG CAPSULE: 100 mg | ORAL | Qty: 1

## 2014-12-02 MED FILL — ABILIFY 5 MG TABLET: 5 mg | ORAL | Qty: 3

## 2014-12-02 MED FILL — OXYCODONE 5 MG TAB: 5 mg | ORAL | Qty: 3

## 2014-12-02 MED FILL — TIZANIDINE 4 MG TAB: 4 mg | ORAL | Qty: 1

## 2014-12-02 MED FILL — NATURAL BALANCE TEARS 0.1 %-0.3 % EYE DROPS: OPHTHALMIC | Qty: 15

## 2014-12-02 MED FILL — FLUOXETINE 20 MG CAP: 20 mg | ORAL | Qty: 1

## 2014-12-02 MED FILL — TIZANIDINE 2 MG TAB: 2 mg | ORAL | Qty: 1

## 2014-12-02 MED FILL — ONDANSETRON (PF) 4 MG/2 ML INJECTION: 4 mg/2 mL | INTRAMUSCULAR | Qty: 2

## 2014-12-02 MED FILL — ATORVASTATIN 40 MG TAB: 40 mg | ORAL | Qty: 1

## 2014-12-02 MED FILL — LOVENOX 40 MG/0.4 ML SUBCUTANEOUS SYRINGE: 40 mg/0.4 mL | SUBCUTANEOUS | Qty: 0.4

## 2014-12-02 MED FILL — MIRTAZAPINE 15 MG TAB: 15 mg | ORAL | Qty: 2

## 2014-12-02 NOTE — Progress Notes (Signed)
Cardiopulmonary Care Interdisciplinary rounds were held today to discuss patient plan of care and outcomes. The following members were present: Physician, Pharmacy, Nursing and Case Management.    Plan of Care: Improve communication with nurses/physicians     D/C tomorrow

## 2014-12-02 NOTE — Progress Notes (Signed)
Hospitalist Progress Note    NAME: Dale Weaver   DOB:  04/13/1951   MRN:  [id number]       Interim Hospital Summary: 64 y.o. male whom presented on 12/01/2014 with      Assessment / Plan:    Right sided weakness:    Clinically does not look stroke, confirmed by MRI. Likely TIA vs conversion disorder.   Started on plavix  On statin   --had implanted loop recorder -- no afib, recorder removed 06/2014     Headache:    On depakote already   Oxycodone as needed     Parkinson's disease  --on sinemet     CAD     s/p MI and stent  On plavix, statin, BB and ACEI       Chronic back pain on chronic narcotic  S/p  back surgery x 4  --morphine prn for now    MDD/ GAD     On several medication, controlled   Psych recs appreciated       Code:?? Discussed full  mPOA Zadie Cleverly [phone]  DVT prophylaxis:?? lovenox to start tomorrow    Attempted med reconciliation.?? Meds entered but dose unknown and provided mPOA with contact numbers to call with info when return home.??           Subjective:     Chief Complaint / Reason for Physician Visit      Pt continues to have headache states is similar to one when he had TIA last yr , morphine helps to bring froom 9 to 7 like last yr only with time they pass away.     Review of Systems:  Symptom Y/N Comments  Symptom Y/N Comments   Fever/Chills n   Chest Pain n    Poor Appetite n   Edema n    Cough n   Abdominal Pain n    Sputum n   Joint Pain n    SOB/DOE n   Pruritis/Rash n    Nausea/vomit n   Tolerating PT/OT     Diarrhea n   Tolerating Diet     Constipation n   Other       Could NOT obtain due to:      Objective:     VITALS:   Last 24hrs VS reviewed since prior progress note. Most recent are:  Patient Vitals for the past 24 hrs:   Temp Pulse Resp BP SpO2   12/02/14 1143 - 66 - 111/72 mmHg 95 %   12/02/14 1132 - - - 99/68 mmHg -   12/02/14 1127 - 69 - 112/71 mmHg 94 %   12/02/14 1121 - 64 - 101/67 mmHg 94 %    12/02/14 1059 97.7 ??F (36.5 ??C) 67 20 107/68 mmHg 92 %   12/02/14 0716 97.7 ??F (36.5 ??C) 67 20 107/63 mmHg 93 %   12/02/14 0305 97.6 ??F (36.4 ??C) (!) 53 18 97/64 mmHg 95 %   12/01/14 2215 98.1 ??F (36.7 ??C) 80 20 96/63 mmHg 94 %   12/01/14 1912 97.6 ??F (36.4 ??C) 99 18 116/76 mmHg 93 %   12/01/14 1508 97.5 ??F (36.4 ??C) 66 17 133/69 mmHg 98 %       Intake/Output Summary (Last 24 hours) at 12/02/14 1354  Last data filed at 12/02/14 1258   Gross per 24 hour   Intake    480 ml   Output   1225 ml   Net   -745 ml  PHYSICAL EXAM:  General: WD, WN. Alert, cooperative, no acute distress????  EENT:  EOMI. Anicteric sclerae. MMM  Resp:  CTA bilaterally, no wheezing or rales.  No accessory muscle use  CV:  Regular  rhythm,?? No edema  GI:  Soft, Non distended, Non tender. ??+Bowel sounds  Neurologic:?? Alert and oriented X 3, normal speech, power right 4/5 UE and LE , sensation decresaed in right side including right face and forehead   Psych:???? Good insight.??Not anxious nor agitated  Skin:  No rashes.  No jaundice    Reviewed most current lab test results and cultures  YES  Reviewed most current radiology test results   YES  Review and summation of old records today    NO  Reviewed patient's current orders and MAR    YES  PMH/SH reviewed - no change compared to H&P  ________________________________________________________________________  Care Plan discussed with:    Comments   Patient x    Family      RN x    Care Manager x    Consultant  x                     x Multidiciplinary team rounds were held today with case manager, nursing, pharmacist and clinical coordinator.  Patient's plan of care was discussed; medications were reviewed and discharge planning was addressed.     ________________________________________________________________________  Total NON critical care TIME:  30 Minutes    Total CRITICAL CARE TIME Spent:   Minutes non procedure based      Comments    >50% of visit spent in counseling and coordination of care     ________________________________________________________________________  Evelene Croon, MD     Procedures: see electronic medical records for all procedures/Xrays and details which were not copied into this note but were reviewed prior to creation of Plan.      LABS:  I reviewed today's most current labs and imaging studies.  Pertinent labs include:  Recent Labs      12/02/14   0448  12/01/14   0800   WBC  3.9*  4.1   HGB  10.8*  12.7   HCT  32.0*  37.3   PLT  103*  123*     Recent Labs      12/02/14   0448  12/01/14   0737   NA  147*  148*   K  3.7  3.4*   CL  113*  112*   CO2  26  28   GLU  83  94   BUN  17  21*   CREA  0.67*  0.80   CA  7.8*  8.6   MG  1.9   --    PHOS  3.8   --    ALB   --   3.3*   TBILI   --   0.6   SGOT   --   22   ALT   --   74   INR   --   1.2*

## 2014-12-02 NOTE — Progress Notes (Addendum)
[facility] SHIFT NURSING NOTE    Bedside and Verbal shift change report given to Deliah Goody., RN  by Martinique B., RN. Report included the following information SBAR, Kardex and MAR.        SHIFT SUMMARY: Patient complaining of headache most of shift, medicated PRN with morphine as ordered. Should be discharged home tomorrow.         Admission Date 12/01/2014   Admission Diagnosis Stroke (cerebrum) ([place])   Consults IP CONSULT TO NEUROLOGY  IP CONSULT TO PSYCHIATRY        Consults   PT   OT   Speech   Case Management       Palliative     Hospice      Cardiac Monitoring Order   Yes   No      GI Prophylaxis  (Ex: Protonix, Pepcid, etc,.)   Yes   No            DVT Prophylaxis   SCDs:             Ted stockings:          Medication (Ex: Lovenox, Heparin, etc..)   Contraindicated   None         Activity Level Activity Level: Up with Assistance     Activity Assistance: Partial (one person)   Purposeful Rounding every 1-2 hour?   Yes    Schmid Score  Total Score: 4   Bed Alarm (If score 3 or >)   Yes     Refused (See signed refusal form in chart)   Braden Score  Braden Score: 19   Braden Score (if score 14 or less)   PMT consult   Wound Care consult      Specialty bed    Nutrition consult          Needs prior to discharge:   Home O2 required:    Yes   No    If yes, how much O2 required?    Other:    Last Bowel Movement Date: 11/29/14 (per patient)        POST-OP SURGICAL VATS   Yes   N/A     Incentive Spirometer:   Yes   Refused   Coughing and deep breathing:     Yes   Refused   Oral care:     Yes   Refused   Understanding (patient education):     Yes   Refused   Getting out of bed Number times ambulated in hallway past shift:    Number of times OOB to chair past shift:     Head of bed elevation:     Yes   Refused      Influenza Vaccine Received Flu Vaccine for Current Season (usually Sept-March): Yes        Pneumonia Vaccine Pneumococcal Vaccine Received in the Past: Yes         Diet Active Orders   Diet    DIET CARDIAC Regular       LDAs               Peripheral IV 12/01/14 Left Arm (Active)   Site Assessment Clean, dry, & intact 12/02/2014  7:20 AM   Phlebitis Assessment 0 12/02/2014  7:20 AM   Infiltration Assessment 0 12/02/2014  7:20 AM   Dressing Status Clean, dry, & intact 12/02/2014  7:20 AM   Dressing Type Transparent;Tape 12/02/2014  7:20 AM   Hub Color/Line Status Blue;Infusing 12/02/2014  7:20 AM   Alcohol Cap Used No 12/02/2014  3:05 AM                      Urinary Catheter      Intake & Output   Date 12/01/14 0700 - 12/02/14 0659 12/02/14 0700 - 12/03/14 0659 Shift 0700-1859 1900-0659 24 Hour Total 0700-1859 1900-0659 24 Hour Total   I  N  T  A  K  E   P.O.    480  480      P.O.    480  480    I.V.  (mL/kg/hr) 1100  (1.2)  1100  (0.6)         I.V. 1100  1100       Shift Total  (mL/kg) 1100  (14.2)  1100  (14.4) 480  (6.3)  480  (6.3)   O  U  T  P  U  T   Urine  (mL/kg/hr) 350  (0.4) 250  (0.3) 600  (0.3) 625  625      Urine Voided 350 250 600 625  625      Urine Occurrence(s) 1 x  1 x       Shift Total  (mL/kg) 350  (4.5) 250  (3.3) 600  (7.9) 625  (8.2)  625  (8.2)   NET 750 -250 500 -145  -145   Weight (kg) 77.3 76.4 76.4 76.4 76.4 76.4         Readmission Risk Assessment Tool Score Medium Risk            17       Total Score        3 Relationship with PCP    4 More than 1 Admission in calendar year    4 Patient Insurance is Medicare, Medicaid or Self Pay    6 Charlson Comorbidity Score        Criteria that do not apply:    Patient Living Status    Patient Length of Stay > 5       Expected Length of Stay 2d 2h   Actual Length of Stay 1

## 2014-12-02 NOTE — Progress Notes (Signed)
Problem: Self Care Deficits Care Plan (Adult)  Goal: *Acute Goals and Plan of Care (Insert Text)  Occupational Therapy Goals  Initiated 12/02/2014  1. Patient will perform grooming in standing with supervision/set-up within 7 day(s).  2. Patient will perform upper body dressing and lower body dressing with modified independence, using adaptive aids prn, within 7 day(s).  3. Patient will perform standing adls for at least 10 minutes with supervision/set-up within 7 day(s).  4. Patient will perform toilet transfers with supervision/set-up within 7 day(s).  5. Patient will perform all aspects of toileting with supervision/set-up within 7 day(s).  6. Patient will participate in upper extremity therapeutic exercise/activities with supervision/set-up for 10 minutes within 7 day(s).   OCCUPATIONAL THERAPY EVALUATION  Patient: Dale Weaver (64 y.o. male)  Date: 12/02/2014  Primary Diagnosis: Stroke (cerebrum) [facility])        Precautions: fall         ASSESSMENT :  Based on the objective data described below, the patient presents with complex medical/psychiatric history, including baseline Parkinson's Disease (past 2 years) and chronic back pain, impaired sensation entire R side, severe headache, decreased strength RUE generally 4-/5, decreased balance, generalized weakness and decreased endurance impairing adls and functional mobility.  Pt is functioning below his baseline of mod I to S for self care and functional mobility.   Pt  Would benefit from [facility] therapies as he has 24 hour support from caregivers at home.  He may also benefit from short term intensive rehab at discharge prior to discharge home .    Patient will benefit from skilled intervention to address the above impairments.  Patient???s rehabilitation potential is considered to be Good  Factors which may influence rehabilitation potential include:   [ ]              None noted  [ ]              Mental ability/status  [ ]              Medical condition   [ ]              Home/family situation and support systems  [ ]              Safety awareness  [X]              Pain tolerance/management  [ ]              Other:        PLAN :  Recommendations and Planned Interventions:  [X]                Self Care Training                  [X]         Therapeutic Activities  [X]                Functional Mobility Training    [ ]         Cognitive Retraining  [X]                Therapeutic Exercises           [X]         Endurance Activities  [X]                Balance Training                   [X]         Neuromuscular Re-Education  [ ]   Visual/Perceptual Training     [X]    Home Safety Training  [X]                Patient Education                 [X]         Family Training/Education  [ ]                Other (comment):    Frequency/Duration: Patient will be followed by occupational therapy 3 times a week to address goals.  Discharge Recommendations: Home Health and 24 hour support of his caregivers vs. Short term intensive rehab.  Pt is followed medically at the [facility] per his report.  Further Equipment Recommendations for Discharge: pt may benefit from adaptive aids for lower body adls       SUBJECTIVE:   Patient stated ???It's a 9 now.???  (pt's headache increased post mobility--VSS throughout tx session)      OBJECTIVE DATA SUMMARY:       Past Medical History   Diagnosis Date   ??? Back pain         chronic   ??? Heart failure (HCC)         CHF   ??? CAD (coronary artery disease)         MI 2008   ??? Psychiatric disorder         anxiety   ??? Hiatal hernia     ??? TIA (transient ischemic attack) 01/2014   ??? Parkinson's disease [facility])       Past Surgical History   Procedure Laterality Date   ??? Hx orthopaedic           back surgery x4, last June 2010   ??? Hx appendectomy       ??? Hx cholecystectomy       ??? Hx hernia repair       ??? Pr cardiac surg procedure unlist           cardiac cath August 2009   ??? Pr cardiac surg procedure unlist   2010       x 1 stent[facility]    ??? Pr cardiac surg procedure unlist           insertion of loop recorder removed 06/2014     Prior Level of Function/Home Situation: Pt lives with 24 hour assistance in his one level home.  Pt uses a RW without assistance in his home environment.  Pt is generally independent to supervision for self care.  He reports that he has all the equipment that he needs at home.  Pt used to go outdoors more often and work in his yard, however he has not been able to do so lately.     Home Situation  Home Environment: Private residence  # Steps to Enter: 0  Wheelchair Ramp: Yes  One/Two Story Residence: One story  Living Alone: No (lives w 2 friends)  Support Systems: Family member(s), Friends \\ neighbors (24 hour caregiver)  Patient Expects to be Discharged to:: Private residence  Current DME Used/Available at Home: Cane, straight, Hospital bed, Oxygen, portable, Civil engineer, contracting, Environmental consultant, rollator, Other (comment) (button hook  long  ashoe horn, battery  heavy spoon/fork)  Tub or Shower Type: Tub/Shower combination  [X]   Right hand dominant             [ ]   Left hand dominant  Cognitive/Behavioral Status:  Neurologic State: Alert;Confused  Orientation Level: Oriented  to person;Oriented to place  Cognition: Follows commands  Perception: Appears intact (however, reports blurred vision R eye)  Perseveration: No perseveration noted  Safety/Judgement: Awareness of environment;Fall prevention;Insight into deficits  Skin: generally intact  Edema: none observed  Vision/Perceptual:    Tracking: Able to track stimulus in all quadrants w/o difficulty                      Acuity: Impaired near vision;Impaired far vision (decreased R eye acuity from baseline per pt report)    Corrective Lenses: Glasses  Range of Motion:  BUEs:   AROM: Generally decreased, functional         pt has a stiff quality to ROM due to his PD dx              Strength:  BUEs:     Strength: Generally decreased, functional      RUE is generally 4-/5 strength   LUE is generally 5/5        Coordination:  Coordination: Generally decreased, functional  Fine Motor Skills-Upper: Left Impaired;Right Impaired (this is baseline-uses button hook for fastening)    Gross Motor Skills-Upper: Left Intact;Right Impaired (decreased strength 4-/5 RUE throughout )  Tone & Sensation:    Tone: Abnormal  Pt has tremors due to his PD  Sensation: Impaired entire R side--face, RUE, trunk, LE and foot                    Balance:  Sitting: Intact  Standing: Impaired  Standing - Static: Constant support;Good  Standing - Dynamic : Fair  Functional Mobility and Transfers for ADLs:  Bed Mobility:     Supine to Sit: Supervision     Scooting: Supervision  Transfers:  Sit to Stand: Contact guard assistance                                      Toilet Transfer : Contact guard assistance (using RW) and grab bar                                               ADL Assessment:  Feeding: Modified independent (baseline uses adaptive utensil)    Oral Facial Hygiene/Grooming: Setup    Bathing: Minimum assistance    Upper Body Dressing: Minimum assistance (uses button hook for fastening buttons)    Lower Body Dressing: Moderate assistance (difficulty reaching to feet due to back pain)    Toileting: Stand by assistance;Setup              ADL Intervention:   Pt educated on role of OT and OT plan of care.  Pt educated in adaptive technique for BLE dressing.  Pt is unable to perform crossed leg technique and reports that bending forward to his feet causes his back pain to increase.  Educated pt on the availability of sock aide and reacher for lower body adls.  Will trial these adaptive aids in future session.                                  Cognitive Retraining  Safety/Judgement: Awareness of environment;Fall prevention;Insight into deficits    Therapeutic Exercise:  Encouraged pt to perform  seated exercises.     Functional Measure:  Barthel Index:      Bathing: 0  Bladder: 10  Bowels: 10  Grooming: 5  Dressing: 5   Feeding: 5 (uses adapted utensils at baseline)  Mobility: 0  Stairs: 0  Toilet Use: 5  Transfer (Bed to Chair and Back): 10  Total: 50        Barthel and G-code impairment scale:  Percentage of impairment CH  0% CI  1-19% CJ  20-39% CK  40-59% CL  60-79% CM  80-99% CN  100%   Barthel Score 0-100 100 99-80 79-60 59-40 20-39 1-19    0   Barthel Score 0-20 20 17-19 13-16 9-12 5-8 1-4 0      The Barthel ADL Index: Guidelines  1. The index should be used as a record of what a patient does, not as a record of what a patient could do.  2. The main aim is to establish degree of independence from any help, physical or verbal, however minor and for whatever reason.  3. The need for supervision renders the patient not independent.  4. A patient's performance should be established using the best available evidence. Asking the patient, friends/relatives and nurses are the usual sources, but direct observation and common sense are also important. However direct testing is not needed.  5. Usually the patient's performance over the preceding 24-48 hours is important, but occasionally longer periods will be relevant.  6. Middle categories imply that the patient supplies over 50 per cent of the effort.  7. Use of aids to be independent is allowed.    Daneen Schick., Barthel, D.W. [phone]). Functional evaluation: the Barthel Index. [place] (14)2.  Lucianne Lei der [place], J.J.M.F, [place], Diona Browner., Oris Drone., [place], [place] (1999). Measuring the change indisability after inpatient rehabilitation; comparison of the responsiveness of the Barthel Index and Functional Independence Measure. Journal of Neurology, Neurosurgery, and Psychiatry, 66(4), [phone].  Wilford Sports, N.J.A, Schol[place],  W.J.M, & Koop[place]chap, M.A. (2004.) Assessment of post-stroke quality of life in cost-effectiveness studies: The usefulness of the Barthel Index and the EuroQoL-5D. Quality of Life Research, 13, 427-43           G codes:   In compliance with CMS???s Claims Based Outcome Reporting, the following G-code set was chosen for this patient based on their primary functional limitation being treated:    The outcome measure chosen to determine the severity of the functional limitation was the Barthel INdex with a score of 50/100 which was correlated with the impairment scale.      ?? Self Care:              [phone] - CURRENT STATUS:           CK - 40%-59% impaired, limited or restricted              W7371 - GOAL STATUS:                   CJ - 20%-39% impaired, limited or restricted              G6269 - D/C STATUS:                       ---------------To be determined---------------     Pain:  Pain Scale 1: Numeric (0 - 10)  Pain Intensity 1: 9  Pain Location 1: Head  Nursing aware and has provided medications  Pain Orientation  1: Anterior  Pain Description 1: Aching;Constant  Pain Intervention(s) 1: Medication (see MAR)  Activity Tolerance:   Good.  VSS throughout all positions.  Please refer to the flowsheet for vital signs taken during this treatment.  After treatment:   [X]  Patient left in no apparent distress sitting up in chair  [ ]  Patient left in no apparent distress in bed  [X]  Call bell left within reach  [X]  Nursing notified  [ ]  Caregiver present  [X]  Bed alarm activated      COMMUNICATION/EDUCATION:   The patient???s plan of care was discussed with: Physical Therapist and Registered Nurse.  [ ]  Home safety education was provided and the patient/caregiver indicated understanding.  [X]  Patient/family have participated as able in goal setting and plan of care.  [ ]  Patient/family agree to work toward stated goals and plan of care.  [ ]  Patient understands intent and goals of therapy, but is neutral about his/her participation.  [ ]  Patient is unable to participate in goal setting and plan of care.  This patient???s plan of care is appropriate for delegation to OTA.    Thank you for this referral.  Isac Caddy, OTR/L   44 minutes

## 2014-12-02 NOTE — Discharge Summary (Signed)
Hospitalist Discharge Summary     Patient ID:  Dale Weaver  [id number]  64 y.o.  05-16-1951    PCP on record: Phys Other, MD    Admit date: 12/01/2014  Discharge date and time: 12/03/2014      DISCHARGE DIAGNOSIS:      Right sided weakness:  Headache:  Parkinson's disease  CAD  Chronic back pain on chronic narcotic  MDD/ GAD                     CONSULTATIONS:  IP CONSULT TO NEUROLOGY  IP CONSULT TO PSYCHIATRY    Excerpted HPI from H&P of Nolon Nations, MD:    Dale Weaver is a 64 y.o.?? Right handed Caucasian male with hx TIA and Parkinson's disease who had headache yesterday evening, took Aleve, went to bed at 11pm.?? Woke up at 6:15am with right face, arm, leg numbness and weakness, blurred vision in right eye, right face "twisted", crying.?? POA said speech was garbled and patient was pale; both of which have improved.  ______________________________________________________________________  DISCHARGE SUMMARY/HOSPITAL COURSE:  for full details see H&P, daily progress notes, labs, consult notes.       Right sided weakness:    Clinically does not look stroke, confirmed by MRI. Likely TIA vs conversion disorder. ??  Started on plavix  On statin ??  --had implanted loop recorder in past with no afib detected     Headache:    On depakote already ??  Oxycodone as needed     Parkinson's disease  --on sinemet     CAD    ??s/p MI and stent  On plavix, statin, BB and ACEI       Chronic back pain on chronic narcotic  S/p?? back surgery x 4  --morphine prn for now    MDD/ GAD     On several medication, controlled ??  Psych recs appreciated               _______________________________________________________________________  Patient seen and examined by me on discharge day.  Pertinent Findings:  Gen:    Not in distress  Chest: Clear lungs  CVS:   Regular rhythm.  No edema  Abd:  Soft, not distended, not tender  Neuro:  Alert, oriented x 3 , continues to have right sided weakness    _______________________________________________________________________  DISCHARGE MEDICATIONS:   Current Discharge Medication List      START taking these medications    Details   clopidogrel (PLAVIX) 75 mg tablet Take 1 Tab by mouth daily.  Qty: 30 Tab, Refills: 0         CONTINUE these medications which have NOT CHANGED    Details   ARIPiprazole (ABILIFY) 30 mg tablet Take 15 mg by mouth daily.      carbidopa-levodopa (SINEMET) 25-100 mg per tablet Take 1 Tab by mouth three (3) times daily.      FLUoxetine (PROZAC) 20 mg tablet Take 20 mg by mouth daily.      gabapentin (NEURONTIN) 400 mg capsule Take 400 mg by mouth every eight (8) hours.      divalproex ER (DEPAKOTE ER) 500 mg ER tablet Take 1,000 mg by mouth nightly.      docusate sodium (COLACE) 100 mg capsule Take 100 mg by mouth two (2) times a day.      CARBOXYMETHYLCELLULOS/GLYCERIN (LUBRICATING DROPS OP) Apply  to eye nightly.      mirtazapine (REMERON) 30 mg tablet Take 30  mg by mouth nightly.      omeprazole (PRILOSEC) 20 mg capsule Take 20 mg by mouth two (2) times a day.      !! tiZANidine (ZANAFLEX) 2 mg tablet Take 2 mg by mouth every morning. 1 tab in AM and 2 tab bedtime      !! tiZANidine (ZANAFLEX) 2 mg tablet Take 4 mg by mouth nightly. 1 tab in AM and 2 tabs bedtime      traMADol (ULTRAM) 50 mg tablet Take 50 mg by mouth three (3) times daily as needed for Pain.      cyanocobalamin (VITAMIN B12) 1,000 mcg/mL injection 1,000 mcg by IntraMUSCular route every thirty (30) days.      cholecalciferol (VITAMIN D3) 1,000 unit tablet Take 1,000 Units by mouth three (3) times daily.      atorvastatin (LIPITOR) 80 mg tablet Take 40 mg by mouth daily.      acetaminophen (TYLENOL) 500 mg tablet Take 500 mg by mouth every six (6) hours as needed for Pain.      oxyCODONE IR (OXY-IR) 15 mg immediate release tablet Take 15 mg by mouth three (3) times daily.      miconazole (MICOTIN) 2 % topical powder Apply  to affected area daily.        !! - Potential duplicate medications found. Please discuss with provider.      STOP taking these medications       bisacodyl (DULCOLAX) 10 mg suppository Comments:   Reason for Stopping:         aspirin delayed-release 81 mg tablet Comments:   Reason for Stopping:               My Recommended Diet, Activity, Wound Care, and follow-up labs are listed in the patient's Discharge Insturctions which I have personally completed and reviewed.    _______________________________________________________________________  DISPOSITION:    Home with Family:    Home with HH/PT/OT/RN: x   SNF/LTC:    SA[facility]:    OTHER:        Condition at Discharge:  Stable  _______________________________________________________________________  Follow up with:   PCP : Phys Other, MD  Follow-up Information     Follow up With Details Comments Contact Info    Phys Other, MD   Patient can only remember the practice name and not the physician      Shaune Pollack, MD In 2 weeks  [address]   [address]  [place] [address]  [phone]                Total time in minutes spent coordinating this discharge (includes going over instructions, follow-up, prescriptions, and preparing report for sign off to her PCP) :  35 minutes    Signed:  Evelene Croon, MD

## 2014-12-02 NOTE — Progress Notes (Signed)
Problem: Mobility Impaired (Adult and Pediatric)  Goal: *Acute Goals and Plan of Care (Insert Text)  Physical Therapy Goals  Initiated 12/02/2014  1. Patient will move from supine to sit and sit to supine in bed with independence within 7 day(s).   2. Patient will transfer from bed to chair and chair to bed with independence using the least restrictive device within 7 day(s).  3. Patient will perform sit to stand with independence within 7 day(s).  4. Patient will ambulate with independence for 250 feet with the least restrictive device within 7 day(s).   5. Patient will ascend/descend 2 stairs with handrail(s) with minimal assistance/contact guard assist within 7 day(s).  6. Patient will participate in higher level balance skills activities with CGA within 7 days.  PHYSICAL THERAPY EVALUATION  SEEN 0981 TO 1150      Patient: Dale Weaver (64 y.o. male)  Date: 12/02/2014  Primary Diagnosis: Stroke (cerebrum) [facility])  Precautions: Falls   ASSESSMENT :  Based on the objective data described below, the patient presents with decreased functional mobility and gait skills.  Fine motor skills are diminished.  He has a resting tremor in bilateral UEs, left greater than right today.  States the right is usually the worst one.  Rose from sit to stand with CGA to the RW.  Able to ambulate 120' with RW and CGA.  Needed cues for heel strike and a faster pace of gait.  Discussed exercises that would have lots of reciprocal movements.  Stated he had a stationary set of foot pedals that he used at times.  Encouraged him to use them frequently as it would like help his gait.  He was agreeable to this.  Up in chair at end of session.  Reviewed some LE exercises that he could be doing on his one in the chair or on the bed.  Would benefit from HHPT at discharge.    Patient will benefit from skilled intervention to address the above impairments.  Patient???s rehabilitation potential is considered to be Good   Factors which may influence rehabilitation potential include:   [ ]          None noted  [X]          Mental ability/status  [X]          Medical condition  [ ]          Home/family situation and support systems  [X]          Safety awareness  [ ]          Pain tolerance/management  [ ]          Other:        PLAN :  Recommendations and Planned Interventions:  [X]            Bed Mobility Training             [ ]     Neuromuscular Re-Education  [X]            Transfer Training                   [ ]     Orthotic/Prosthetic Training  [X]            Gait Training                         [ ]     Modalities  [X]            Therapeutic Exercises           [ ]   Edema Management/Control  [ ]            Therapeutic Activities            [X]     Patient and Family Training/Education  [ ]            Other (comment):    Frequency/Duration: Patient will be followed by physical therapy 3 times a week to address goals.  Discharge Recommendations: Home Health  Further Equipment Recommendations for Discharge: none       SUBJECTIVE:   Patient stated ???I have some pedals that I put in the floor and use sometimes.???      OBJECTIVE DATA SUMMARY:       Past Medical History   Diagnosis Date   ??? Back pain         chronic   ??? Heart failure (HCC)         CHF   ??? CAD (coronary artery disease)         MI 2008   ??? Psychiatric disorder         anxiety   ??? Hiatal hernia     ??? TIA (transient ischemic attack) 01/2014   ??? Parkinson's disease [facility])       Past Surgical History   Procedure Laterality Date   ??? Hx orthopaedic           back surgery x4, last June 2010   ??? Hx appendectomy       ??? Hx cholecystectomy       ??? Hx hernia repair       ??? Pr cardiac surg procedure unlist           cardiac cath August 2009   ??? Pr cardiac surg procedure unlist   2010       x 1 stent[facility]   ??? Pr cardiac surg procedure unlist           insertion of loop recorder removed 06/2014     Prior Level of Function/Home Situation: ambulates with straight cane  inside and uses a rollator outside.  Takes at least daily walks, sometimes twice a day weather permitting.  Gets help with ADLs.   Home Situation  Home Environment: Private residence  # Steps to Enter: 0  Wheelchair Ramp: Yes  One/Two Story Residence: One story  Living Alone: No (lives w 2 friends)  Support Systems: Family member(s), Friends \\ neighbors (24 hour caregiver)  Patient Expects to be Discharged to:: Private residence  Current DME Used/Available at Home: Kasandra Knudsen, straight, Hospital bed, Oxygen, portable, Civil engineer, contracting, Environmental consultant, rollator, Other (comment) (button hook  long  ashoe horn, battery  heavy spoon/fork)  Tub or Shower Type: Tub/Shower combination     Critical Behavior:  Neurologic State: Alert, Confused  Orientation Level: Oriented to person, Disoriented to place, Disoriented to time, Disoriented to situation  Cognition: Appropriate decision making, Appropriate for age attention/concentration, Follows commands       Strength:    Strength: Generally decreased, functional                    Tone & Sensation:   Tone: Abnormal              Sensation: Intact               Range Of Motion:  AROM: Generally decreased, functional  Coordination:  Coordination: Generally decreased, functional    Functional Mobility:  Bed Mobility:              Transfers:  Sit to Stand: Contact guard assistance  Stand to Sit: Contact guard assistance                       Balance:   Sitting: Intact  Standing: Impaired  Standing - Static: Constant support;Good  Standing - Dynamic : Fair  Ambulation/Gait Training:  Distance (ft): 120 Feet (ft)  Assistive Device: Gait belt;Walker, rolling  Ambulation - Level of Assistance: Contact guard assistance    Therapeutic Exercises:         EXERCISE   Sets   Reps   Active Active Assist   Passive   Comments   Toe Wiggles 1 5 [X]   [ ]   [ ]       Ankle Pumps/Circles 1 5 [X]   [ ]   [ ]       Marching 1 5 [X]   [ ]   [ ]       Horizontal Abduction/Adduction 1 5 [X]   [ ]   [ ]        Long Arc Quads 1 5 [X]   [ ]   [ ]             Functional Measure:    Elder Mobility Scale      9/20          EMS and G-code impairment scale:  Percentage of impairment CH  0% CI  1-19% CJ  20-39% CK  40-59% CL  60-79% CM  80-99% CN  100%   EMS Score 0-20 20 17-19 13-16 9-12 5-8 1-4 0      Scores under 10 ??? generally these patients are dependent in mobility maneuvers; require help with  basic ADL, such as transfers, toileting and dressing.    Scores between 10 ??? 13 ??? generally these patients are borderline in terms of safe mobility and  independence in ADL i.e. they require some help with some mobility maneuvers.    Scores over 14 ??? Generally these patients are able to perform mobility maneuvers alone and safely  and are independent in basic ADL.               G codes:  In compliance with CMS???s Claims Based Outcome Reporting, the following G-code set was chosen for this patient based on their primary functional limitation being treated:    The outcome measure chosen to determine the severity of the functional limitation was the Elderly Mobility Scale Score with a score of 9/20 which was correlated with the impairment scale.      ?? Mobility - Walking and Moving Around:              W1191 - CURRENT STATUS:       CK - 40%-59% impaired, limited or restricted              Y7829 - GOAL STATUS:               CJ - 20%-39% impaired, limited or restricted              F6213 - D/C STATUS:                       ---------------To be determined---------------     Pain:  Pain Scale 1: Numeric (0 - 10)  Pain Intensity 1: 2  Pain Location 1:  Head  Pain Orientation 1: Anterior  Pain Description 1: Aching;Constant  Pain Intervention(s) 1: Medication (see MAR)  Activity Tolerance:   Tolerated PT evaluation/treatment session well.  Please refer to the flowsheet for vital signs taken during this treatment.  After treatment:   [X]          Patient left in no apparent distress sitting up in chair   [ ]          Patient left in no apparent distress in bed  [X]          Call bell left within reach  [X]          Nursing notified ([place])  [ ]          Caregiver present  [X]          Chair alarm activated      COMMUNICATION/EDUCATION:   The patient???s plan of care was discussed with: Occupational Therapist and Registered Nurse.  [X]          Fall prevention education was provided and the patient/caregiver indicated understanding.  [ ]          Patient/family have participated as able in goal setting and plan of care.  [X]          Patient/family agree to work toward stated goals and plan of care.  [ ]          Patient understands intent and goals of therapy, but is neutral about his/her participation.  [ ]          Patient is unable to participate in goal setting and plan of care.    Thank you for this referral.  Cloria Spring, PT   Time Calculation: 24 mins

## 2014-12-03 LAB — CBC WITH AUTOMATED DIFF
ABS. BASOPHILS: 0 10*3/uL (ref 0.0–0.1)
ABS. EOSINOPHILS: 0.2 10*3/uL (ref 0.0–0.4)
ABS. LYMPHOCYTES: 1.3 10*3/uL (ref 0.8–3.5)
ABS. MONOCYTES: 0.3 10*3/uL (ref 0.0–1.0)
ABS. NEUTROPHILS: 1.6 10*3/uL — ABNORMAL LOW (ref 1.8–8.0)
BASOPHILS: 0 % (ref 0–1)
EOSINOPHILS: 5 % (ref 0–7)
HCT: 33.4 % — ABNORMAL LOW (ref 36.6–50.3)
HGB: 11.3 g/dL — ABNORMAL LOW (ref 12.1–17.0)
LYMPHOCYTES: 39 % (ref 12–49)
MCH: 32.1 PG (ref 26.0–34.0)
MCHC: 33.8 g/dL (ref 30.0–36.5)
MCV: 94.9 FL (ref 80.0–99.0)
MONOCYTES: 8 % (ref 5–13)
NEUTROPHILS: 48 % (ref 32–75)
PLATELET: 117 10*3/uL — ABNORMAL LOW (ref 150–400)
RBC: 3.52 M/uL — ABNORMAL LOW (ref 4.10–5.70)
RDW: 13.6 % (ref 11.5–14.5)
WBC: 3.4 10*3/uL — ABNORMAL LOW (ref 4.1–11.1)

## 2014-12-03 LAB — METABOLIC PANEL, BASIC
Anion gap: 8 mmol/L (ref 5–15)
BUN/Creatinine ratio: 18 (ref 12–20)
BUN: 14 MG/DL (ref 6–20)
CO2: 30 mmol/L (ref 21–32)
Calcium: 8.2 MG/DL — ABNORMAL LOW (ref 8.5–10.1)
Chloride: 109 mmol/L — ABNORMAL HIGH (ref 97–108)
Creatinine: 0.76 MG/DL (ref 0.70–1.30)
GFR est AA: >60 mL/min/{1.73_m2} (ref >60)
GFR est non-AA: >60 mL/min/{1.73_m2} (ref >60)
Glucose: 92 mg/dL (ref 65–100)
Potassium: 3.9 mmol/L (ref 3.5–5.1)
Sodium: 147 mmol/L — ABNORMAL HIGH (ref 136–145)

## 2014-12-03 MED ORDER — SODIUM PHOSPHATES 9.5 GRAM-3.5 GRAM/59 ML ENEMA
RECTAL | Status: AC
Start: 2014-12-03 — End: 2014-12-03
  Administered 2014-12-03: 14:00:00 via RECTAL

## 2014-12-03 MED ORDER — OXYCODONE 15 MG TAB
15 mg | ORAL_TABLET | Freq: Three times a day (TID) | ORAL | Status: AC | PRN
Start: 2014-12-03 — End: 2014-12-18

## 2014-12-03 MED FILL — CARBIDOPA-LEVODOPA 25 MG-100 MG TAB: 25-100 mg | ORAL | Qty: 1

## 2014-12-03 MED FILL — TIZANIDINE 2 MG TAB: 2 mg | ORAL | Qty: 1

## 2014-12-03 MED FILL — BD POSIFLUSH NORMAL SALINE 0.9 % INJECTION SYRINGE: INTRAMUSCULAR | Qty: 10

## 2014-12-03 MED FILL — OXYCODONE 5 MG TAB: 5 mg | ORAL | Qty: 3

## 2014-12-03 MED FILL — DEPAKOTE ER 250 MG TABLET,EXTENDED RELEASE: 250 mg | ORAL | Qty: 4

## 2014-12-03 MED FILL — GABAPENTIN 300 MG CAP: 300 mg | ORAL | Qty: 1

## 2014-12-03 MED FILL — ABILIFY 5 MG TABLET: 5 mg | ORAL | Qty: 3

## 2014-12-03 MED FILL — LOVENOX 40 MG/0.4 ML SUBCUTANEOUS SYRINGE: 40 mg/0.4 mL | SUBCUTANEOUS | Qty: 0.4

## 2014-12-03 MED FILL — TIZANIDINE 4 MG TAB: 4 mg | ORAL | Qty: 1

## 2014-12-03 MED FILL — FLEET PEDIATRIC 9.5 GRAM-3.5 GRAM/59 ML ENEMA: RECTAL | Qty: 66

## 2014-12-03 MED FILL — MIRTAZAPINE 15 MG TAB: 15 mg | ORAL | Qty: 2

## 2014-12-03 MED FILL — DOK 100 MG CAPSULE: 100 mg | ORAL | Qty: 1

## 2014-12-03 MED FILL — MORPHINE 2 MG/ML INJECTION: 2 mg/mL | INTRAMUSCULAR | Qty: 1

## 2014-12-03 MED FILL — FLUOXETINE 20 MG CAP: 20 mg | ORAL | Qty: 1

## 2014-12-03 MED FILL — CLOPIDOGREL 75 MG TAB: 75 mg | ORAL | Qty: 1

## 2014-12-03 MED FILL — ATORVASTATIN 40 MG TAB: 40 mg | ORAL | Qty: 1

## 2014-12-03 NOTE — Progress Notes (Signed)
[facility] SHIFT NURSING NOTE    [facility] BEDSIDE_VERBALshift change report given to  (oncoming nurse) by  Engineer, drilling). Report included the following information SBAR REPORTS         SHIFT SUMMARY:           Admission Date 12/01/2014   Admission Diagnosis Stroke (cerebrum) ([facility])   Consults IP CONSULT TO NEUROLOGY  IP CONSULT TO PSYCHIATRY        Consults   PT   OT   Speech   Case Management       Palliative     Hospice        Cardiac Monitoring Order   Yes   No        GI Prophylaxis  (Ex: Protonix, Pepcid, etc,.)   Yes   No             DVT Prophylaxis   SCDs:             Ted stockings:          Medication (Ex: Lovenox, Heparin, etc..)   Contraindicated   None                 Activity Level Activity Level: Up with Assistance     Activity Assistance: Partial (one person)   Purposeful Rounding every 1-2 hour?   Yes    Schmid Score  Total Score: 5   Bed Alarm (If score 3 or >)   Yes    Refused (See signed refusal form in chart)   Braden Score  Braden Score: 19   Braden Score (if score 14 or less)   PMT consult   Wound Care consult      Specialty bed    Nutrition consult          Needs prior to discharge:   Home O2 required:    Yes   No     If yes, how much O2 required?  2 LITERS AT NIGHT PRN    Other:    Last Bowel Movement Date: 12/01/14        POST-OP SURGICAL VATS   Yes   N/A     Incentive Spirometer:   Yes   Refused   Coughing and deep breathing:     Yes   Refused   Oral care:     Yes   Refused   Understanding (patient education):     Yes   Refused   Getting out of bed Number times ambulated in hallway past shift:0    Number of times OOB to chair past shift:  0   Head of bed elevation:     Yes   Refused      Influenza Vaccine Received Flu Vaccine for Current Season (usually Sept-March): Yes        Pneumonia Vaccine Pneumococcal Vaccine Received in the Past: Yes         Diet Active Orders   Diet    DIET CARDIAC Regular      LDAs               Peripheral IV 12/01/14 Left Arm (Active)    Site Assessment Clean, dry, & intact 12/02/2014  8:00 PM   Phlebitis Assessment 0 12/02/2014  8:00 PM   Infiltration Assessment 0 12/02/2014  8:00 PM Dressing Status Clean, dry, & intact 12/02/2014  8:00 PM   Dressing Type Transparent 12/02/2014  8:00 PM   Hub Color/Line Status Blue;Capped;Flushed 12/02/2014  8:00 PM  Alcohol Cap Used No 12/02/2014  3:05 AM                      Urinary Catheter      Intake & Output   Date 12/02/14 0700 - 12/03/14 0659 12/03/14 0700 - 12/04/14 0659   Shift 0700-1859 1900-0659 24 Hour Total 0700-1859 1900-0659 24 Hour Total   I  N  T  A  K  E   P.O. 720  720         P.O. 720  720       Shift Total  (mL/kg) 720  (9.4)  720  (9.4)      O  U  T  P  U  T   Urine  (mL/kg/hr) 975  (1.1) 250 1225         Urine Voided [phone]       Shift Total  (mL/kg) 975  (12.8) 250  (3.3) 1225  (16)      NET -255 -250 -505      Weight (kg) 76.4 76.4 76.4 76.4 76.4 76.4         Readmission Risk Assessment Tool Score Medium Risk            17       Total Score        3 Relationship with PCP    4 More than 1 Admission in calendar year    4 Patient Insurance is Medicare, Medicaid or Self Pay    6 Charlson Comorbidity Score        Criteria that do not apply:    Patient Living Status    Patient Length of Stay > 5       Expected Length of Stay 2d 2h   Actual Length of Stay 2

## 2014-12-03 NOTE — Progress Notes (Signed)
Problem: Mobility Impaired (Adult and Pediatric)  Goal: *Acute Goals and Plan of Care (Insert Text)  Physical Therapy Goals  Initiated 12/02/2014  1. Patient will move from supine to sit and sit to supine in bed with independence within 7 day(s).   2. Patient will transfer from bed to chair and chair to bed with independence using the least restrictive device within 7 day(s).  3. Patient will perform sit to stand with independence within 7 day(s).  4. Patient will ambulate with independence for 250 feet with the least restrictive device within 7 day(s).   5. Patient will ascend/descend 2 stairs with handrail(s) with minimal assistance/contact guard assist within 7 day(s).  6. Patient will participate in higher level balance skills activities with CGA within 7 days.   PHYSICAL THERAPY TREATMENT  Patient: Dale Weaver (64 y.o. male)  Date: 12/03/2014  Diagnosis: Stroke (cerebrum) (HCC) <principal problem not specified>       Precautions:    Chart, physical therapy assessment, plan of care and goals were reviewed.      ASSESSMENT:  Pt states he is feeling better and doesn't not have to perform any steps to enter his home. Pt transfers with MOD I/supervision and ambulated 120 ft with RW, CG. Pt requests to not walk to far as he was given a laxative. Pt remained in the chair after session. Recommend HHPT and continued use of RW at home.  Progression toward goals:  [X]       Improving appropriately and progressing toward goals  [ ]       Improving slowly and progressing toward goals  [ ]       Not making progress toward goals and plan of care will be adjusted       PLAN:  Patient continues to benefit from skilled intervention to address the above impairments.  Continue treatment per established plan of care.  Discharge Recommendations:  Home Health  Further Equipment Recommendations for Discharge:  rolling walker       SUBJECTIVE:   Patient stated ???I'm ready to go home.???      OBJECTIVE DATA SUMMARY:   Critical Behavior:   Neurologic State: Agitated, Appropriate for age  Orientation Level: Oriented X4  Cognition: Follows commands, Appropriate safety awareness, Appropriate for age attention/concentration, Appropriate decision making  Safety/Judgement: Awareness of environment, Fall prevention, Insight into deficits  Functional Mobility Training:  Bed Mobility:     Supine to Sit: Modified independent                          Transfers:  Sit to Stand: Supervision  Stand to Sit: Supervision        Balance:  Sitting: Intact  Standing: Intact;With support  Ambulation/Gait Training:  Distance (ft): 120 Feet (ft)  Assistive Device: Gait belt;Walker, rolling  Ambulation - Level of Assistance: Contact guard assistance          Stairs:             Pain:  Pain Scale 1: Numeric (0 - 10)  Pain Intensity 1: 2  Pain Location 1: Head  Pain Orientation 1: Anterior  Pain Description 1: Aching  Pain Intervention(s) 1: Medication (see MAR)  Activity Tolerance:   No s/s of distress with activity     Please refer to the flowsheet for vital signs taken during this treatment.  After treatment:   [X]  Patient left in no apparent distress sitting up in chair  [ ]  Patient left  in no apparent distress in bed  [X]  Call bell left within reach  [X]  Nursing notified  [ ]  Caregiver present  [ ]  Bed alarm activated      COMMUNICATION/COLLABORATION:   The patient???s plan of care was discussed with: Registered Nurse    Karoline Caldwell, PTA   Time Calculation: 16 mins

## 2014-12-03 NOTE — Progress Notes (Addendum)
Pt admitted with possible CVA.  Pt lives in [place] with a friend who will transport him home when stable; has a cane, bed, oxygen, shower chair, rw, rollater, OT aides, has an AD scanned in [place], has refused [facility] in the past; was at [facility] June 2015, MPOA is Zadie Cleverly at [phone], PCP is Dr Luberta Mutter [phone]//fax [phone]; has a 24 hour caregiver; has [facility]; and therapy has recommended [place] [facility]//OT at d/c.  Will discuss with pt to assess if he is willing to have [place].  AD indicates pt wishes to be a DNR.  Will discuss with MD.      DDNR on chart if appropriate.  Staff please call his friend to transport pt home.  Chart ecin'd to [facility] for nursing, PT, and OT.  MD-please complete F2F    1300  D/C plans discussed with pt who pleasantly declines HHC due to having a 24h/d caregiver.  The caregiver is working on arranging transportation home this afternoon.      Care Management Interventions  PCP Verified by CM: Yes  Palliative Care Consult: No  Mode of Transport at Discharge: BLS  MyChart Signup: No  Discharge Durable Medical Equipment: Yes  Physical Therapy Consult: Yes  Occupational Therapy Consult: Yes  Speech Therapy Consult: No  Current Support Network: Lives with Caregiver  Plan discussed with Pt/Family/Caregiver: Yes  Freedom of Choice Offered: Yes  Discharge Location  Discharge Placement: Home

## 2014-12-03 NOTE — Consults (Signed)
See earlier neuro consult note for details of this consult

## 2014-12-26 ENCOUNTER — Inpatient Hospital Stay
Admit: 2014-12-26 | Discharge: 2014-12-27 | Disposition: A | Discharge status: Home / Self Care | Attending: Emergency Medicine

## 2014-12-26 DIAGNOSIS — G8929 Other chronic pain: Secondary | ICD-10-CM

## 2014-12-26 MED ORDER — OXYCODONE-ACETAMINOPHEN 5 MG-325 MG TAB
5-325 mg | ORAL | Status: AC
Start: 2014-12-26 — End: 2014-12-26
  Administered 2014-12-26: 22:00:00 via ORAL

## 2014-12-26 MED ORDER — DIAZEPAM 5 MG TAB
5 mg | ORAL | Status: AC
Start: 2014-12-26 — End: 2014-12-26
  Administered 2014-12-26: 22:00:00 via ORAL

## 2014-12-26 MED ORDER — HYDROMORPHONE (PF) 1 MG/ML IJ SOLN
1 mg/mL | INTRAMUSCULAR | Status: AC
Start: 2014-12-26 — End: 2014-12-26
  Administered 2014-12-26: via INTRAMUSCULAR

## 2014-12-26 MED FILL — DIAZEPAM 5 MG TAB: 5 mg | ORAL | Qty: 1

## 2014-12-26 MED FILL — OXYCODONE-ACETAMINOPHEN 5 MG-325 MG TAB: 5-325 mg | ORAL | Qty: 2

## 2014-12-26 MED FILL — HYDROMORPHONE (PF) 1 MG/ML IJ SOLN: 1 mg/mL | INTRAMUSCULAR | Qty: 2

## 2014-12-26 NOTE — ED Notes (Signed)
Pt. Continues to report crushing chest pain radiating to left arm and back pain 8/10. Dr. Clista Bernhardt notified. Pt. Denies SOB, nausea, or dizziness. Respirations are unlabored.

## 2014-12-26 NOTE — ED Notes (Signed)
Pt. Calm and relaxed, but has significant pain with flexion/extension of back. Muscle strength diminished in BLE due to extreme pain

## 2014-12-26 NOTE — ED Notes (Signed)
Paged phlebotomy for lab work.

## 2014-12-26 NOTE — ED Notes (Addendum)
Pt rates chest pain 8/10 and back pain 10/10. Pt describes chest discofmort as a pressure sensation along with some nausea. Pt placed on cardiac monitor.  Dr. Clista Bernhardt aware with verbal orders received and placed.

## 2014-12-26 NOTE — ED Notes (Signed)
Patient c/o sudden onset of left side chest pain, EKG obtained.

## 2014-12-26 NOTE — ED Provider Notes (Signed)
HPI Comments: Dale Weaver is a 64 y.o. Male with h/o Parkinsons and Chronic back pain who presents to [facility] ED via EMS with cc diffuse lower back pain which radiates to the BLE legs all the way down to the feet, which started last night and has become progressively worse. Pt also reports symptoms of constipation x 3-4 days. Patient states he was in the shower last night when he turned awkwardly and felt a "tweak" in his lower back. He states that his pain wasn't too bad right after the incident however it has become worse since then, to the point where the patient states that he is unable to lift either of his legs because it exacerbates the pain. The patient reports taking .24m of Hydrocodone as well as Ibuprofen however, he states did not provide any symptoms relief. Pt notes a h/o similar symptoms in 2005 after back surgery and again in 2010 after back surgery.          PCP: Phys Other, MD          There are no other complaints changes or physical findings at this time  Written by GRozelle Logan ED Scribe as dictated by CAntonieta Pert DO.    The history is provided by the patient.        Past Medical History:   Diagnosis Date   ??? Back pain      chronic   ??? Heart failure (HCC)      CHF   ??? CAD (coronary artery disease)      MI 2008   ??? Psychiatric disorder      anxiety   ??? Hiatal hernia    ??? TIA (transient ischemic attack) 01/2014   ??? Parkinson's disease ([facility]        Past Surgical History:   Procedure Laterality Date   ??? Hx orthopaedic       back surgery x4, last June 2010   ??? Hx appendectomy     ??? Hx cholecystectomy     ??? Hx hernia repair     ??? Pr cardiac surg procedure unlist       cardiac cath August 2009   ??? Pr cardiac surg procedure unlist  2010     x 1 stent, cardiologist @ [facility]   ??? Pr cardiac surg procedure unlist       insertion of loop recorder removed 06/2014         Family History:   Problem Relation Age of Onset   ??? Stroke Father    ??? Hypertension Mother        History     Social History    ??? Marital Status: WIDOWED     Spouse Name: N/A   ??? Number of Children: N/A   ??? Years of Education: N/A     Occupational History   ??? Not on file.     Social History Main Topics   ??? Smoking status: Never Smoker    ??? Smokeless tobacco: Not on file   ??? Alcohol Use: No   ??? Drug Use: No   ??? Sexual Activity: Not on file     Other Topics Concern   ??? Not on file     Social History Narrative           ALLERGIES: Seafood; Sulfa (sulfonamide antibiotics); and Zoloft      Review of Systems   Constitutional: Negative for fever, chills and fatigue.   HENT: Negative for congestion and  rhinorrhea.    Eyes: Negative for visual disturbance.   Respiratory: Negative for cough, shortness of breath and wheezing.    Cardiovascular: Negative for chest pain and palpitations.   Gastrointestinal: Positive for constipation. Negative for nausea, vomiting, abdominal pain, diarrhea and abdominal distention.   Endocrine: Negative.    Genitourinary: Negative for dysuria and difficulty urinating.   Musculoskeletal: Positive for back pain (lower).   Skin: Negative for rash.   Neurological: Negative for dizziness, weakness and light-headedness.   Psychiatric/Behavioral: Negative for suicidal ideas.       Filed Vitals:    12/26/14 2004 12/26/14 2117 12/27/14 0002 12/27/14 0016   BP: 149/78 136/62 104/53 103/60   Pulse:  87 83 67   Temp:       Resp:  20 18 13    Height:       Weight:       SpO2: 93% 95% 94% 93%            Physical Exam   Constitutional: He is oriented to person, place, and time. He appears well-developed and well-nourished. No distress.   HENT:   Head: Normocephalic and atraumatic.   Mouth/Throat: Oropharynx is clear and moist.   Eyes: Conjunctivae and EOM are normal.   Neck: Neck supple. No JVD present. No tracheal deviation present.   Cardiovascular: Normal rate, regular rhythm and intact distal pulses.  Exam reveals no gallop and no friction rub.    No murmur heard.   Pulmonary/Chest: Effort normal and breath sounds normal. No stridor. No respiratory distress. He has no wheezes.   Abdominal: Soft. Bowel sounds are normal. He exhibits no distension and no mass. There is no tenderness. There is no guarding.   Musculoskeletal: Normal range of motion. He exhibits no edema or tenderness.   +SLR BLE R>L  No deformity   Neurological: He is alert and oriented to person, place, and time. He has normal strength. He displays tremor (L hand).   No focal deficits   Skin: Skin is warm, dry and intact. No rash noted.   Psychiatric: He has a normal mood and affect. His behavior is normal. Judgment and thought content normal.   Nursing note and vitals reviewed.   Written by Rozelle Logan, ED Scribe as dictated by Antonieta Pert, DO.          MDM  Number of Diagnoses or Management Options  Diagnosis management comments: Pt with lower back pain, has a hx of low back pain in the past. Denies any trauma, low suspicion for fracture. Pt neurovascularly intact in his lower extremities, has no bowel or bladder incontinence. Likely an exacerbation of his chronic back pain. Will get Xray, treat symptomatically then reassess.        Amount and/or Complexity of Data Reviewed  Tests in the radiology section of CPT??: ordered and reviewed  Review and summarize past medical records: yes  Independent visualization of images, tracings, or specimens: yes    Patient Progress  Patient progress: stable      Procedures      EKG interpretation: (Preliminary) 2009  Rhythm: normal sinus rhythm; and regular . Rate (approx.): 75; Axis: normal; P wave: normal; QRS interval: normal ; ST/T wave: no changes;  Normal.  Written by Rozelle Logan, ED Scribe as dictated by Antonieta Pert, DO.      7:38 PM  Patient states his pain has not improved.  Written by Rozelle Logan, ED Scribe as dictated by Antonieta Pert, DO.  8:34 PM  Patient now reports that he is having chest pain.   Written by Rozelle Logan, ED Scribe as dictated by Antonieta Pert, DO.      9:19 PM  Patient states he still having chest pain which radiates to his L arm. The patient also states he is still having back pain.  Written by Rozelle Logan, ED Scribe as dictated by Antonieta Pert, DO.      Procedure Note- Peripheral IV Access  10:37 PM  Performed by: Antonieta Pert, DO   Gained IV access using  20 gauge needle because the patient had no vascular access.  After cleaning the site with alcohol prep, the Left AC vein was localized with ultrasound guidance in an anterior approach.  Line confirmation was obtained by direct visualization and good blood return. No anaesthetic was used.  The line was successfully flushed with normal saline and was secured with transparent tape.  Estimated blood loss: none  The procedure took 1-15 minutes, and pt tolerated well.  Written by Rozelle Logan II, ED Scribe, as dictated by Antonieta Pert, DO.      11:04 PM  Patient ambulated to the bathroom without assistance and was able to provide a urine sample.  Written by Rozelle Logan, ED Scribe as dictated by Antonieta Pert, DO.    11:08 PM  Patient's presentation, labs/imaging and plan of care was reviewed with Glean Hess, MD  as part of sign out.  They will follow up on repeat cardiac enzymes and UA as part of the plan discussed with the patient.    Glean Hess, MD 's assistance in completion of this plan is greatly appreciated but it should be noted that I will be the provider of record for this patient.    Antonieta Pert, DO     Progress Note:  12:44 AM  Repeat cardiac enzymes are unremarkable but UA does show evidence of UTI, will d/c home with Keflex.   Written by Dorcas Carrow Fu, ED scribe, as dictated by Glean Hess, MD           LABORATORY TESTS:  Recent Results (from the past 12 hour(s))   EKG, 12 LEAD, INITIAL    Collection Time: 12/26/14  8:09 PM   Result Value Ref Range     Ventricular Rate 75 BPM    Atrial Rate 75 BPM    P-R Interval 172 ms    QRS Duration 104 ms    Q-T Interval 398 ms    QTC Calculation (Bezet) 444 ms    Calculated P Axis 48 degrees    Calculated R Axis 37 degrees    Calculated T Axis 42 degrees    Diagnosis       Normal sinus rhythm  Normal ECG  When compared with ECG of 01-Dec-2014 07:49,  No significant change was found     CK W/ REFLX CKMB    Collection Time: 12/26/14  8:38 PM   Result Value Ref Range    CK 30 (L) 39 - 308 U/L   TROPONIN I    Collection Time: 12/26/14  8:38 PM   Result Value Ref Range    Troponin-I, Qt. <0.04 <0.96 ng/mL   METABOLIC PANEL, COMPREHENSIVE    Collection Time: 12/26/14  8:38 PM   Result Value Ref Range    Sodium 143 136 - 145 mmol/L    Potassium 3.7 3.5 - 5.1 mmol/L    Chloride 107 97 -  108 mmol/L    CO2 29 21 - 32 mmol/L    Anion gap 7 5 - 15 mmol/L    Glucose 95 65 - 100 mg/dL    BUN 17 6 - 20 MG/DL    Creatinine 0.75 0.70 - 1.30 MG/DL    BUN/Creatinine ratio 23 (H) 12 - 20      GFR est AA >60 >60 ml/min/1.62m    GFR est non-AA >60 >60 ml/min/1.726m   Calcium 8.3 (L) 8.5 - 10.1 MG/DL    Bilirubin, total 0.4 0.2 - 1.0 MG/DL    ALT 12 12 - 78 U/L    AST 68 (H) 15 - 37 U/L    Alk. phosphatase 95 45 - 117 U/L    Protein, total 5.7 (L) 6.4 - 8.2 g/dL    Albumin 3.0 (L) 3.5 - 5.0 g/dL    Globulin 2.7 2.0 - 4.0 g/dL    A-G Ratio 1.1 1.1 - 2.2     URINALYSIS W/ RFLX MICROSCOPIC    Collection Time: 12/26/14  9:44 PM   Result Value Ref Range    Color YELLOW/STRAW      Appearance TURBID (A) CLEAR      Specific gravity 1.027 1.003 - 1.030      pH (UA) 6.0 5.0 - 8.0      Protein NEGATIVE  NEG mg/dL    Glucose NEGATIVE  NEG mg/dL    Ketone NEGATIVE  NEG mg/dL    Bilirubin NEGATIVE  NEG      Blood NEGATIVE  NEG      Urobilinogen 1.0 0.2 - 1.0 EU/dL    Nitrites NEGATIVE  NEG      Leukocyte Esterase LARGE (A) NEG      WBC >100 (H) 0 - 4 /hpf    RBC 0-5 0 - 5 /hpf    Epithelial cells FEW FEW /lpf    Bacteria 4+ (A) NEG /hpf     Hyaline Cast 0-2 0 - 5 /lpf   CBC WITH AUTOMATED DIFF    Collection Time: 12/26/14  9:44 PM   Result Value Ref Range    WBC 11.2 (H) 4.1 - 11.1 K/uL    RBC 3.61 (L) 4.10 - 5.70 M/uL    HGB 11.7 (L) 12.1 - 17.0 g/dL    HCT 35.6 (L) 36.6 - 50.3 %    MCV 98.6 80.0 - 99.0 FL    MCH 32.4 26.0 - 34.0 PG    MCHC 32.9 30.0 - 36.5 g/dL    RDW 14.1 11.5 - 14.5 %    PLATELET 134 (L) 150 - 400 K/uL    NEUTROPHILS 87 (H) 32 - 75 %    LYMPHOCYTES 9 (L) 12 - 49 %    MONOCYTES 4 (L) 5 - 13 %    EOSINOPHILS 0 0 - 7 %    BASOPHILS 0 0 - 1 %    ABS. NEUTROPHILS 9.6 (H) 1.8 - 8.0 K/UL    ABS. LYMPHOCYTES 1.0 0.8 - 3.5 K/UL    ABS. MONOCYTES 0.5 0.0 - 1.0 K/UL    ABS. EOSINOPHILS 0.1 0.0 - 0.4 K/UL    ABS. BASOPHILS 0.0 0.0 - 0.1 K/UL   TROPONIN I    Collection Time: 12/26/14 11:33 PM   Result Value Ref Range    Troponin-I, Qt. <0.04 <0.05 ng/mL   CK W/ CKMB & INDEX    Collection Time: 12/26/14 11:33 PM   Result Value Ref Range    CK  30 (L) 39 - 308 U/L    CK - MB 0.6 0.5 - 3.6 NG/ML    CK-MB Index 2.0 0 - 2.5         IMAGING RESULTS:    XR ABD ACUTE W 1 V CHEST (Final result) Result time: 12/26/14 21:13:50   ?? Final result by Rad Results In Edi (12/26/14 21:13:50)   ?? Narrative:   ?? **Final Report**  ??    ICD Codes / Adm.Diagnosis: 12 ??110002 / Back Pain ??Abdominal Pain  Examination: ??CR ABDOMEN ACUTE W PA CHEST ??- [id number] - May ??8 2016 ??9:06PM  Accession No: ??[id number]  Reason: ??eval for constipation      REPORT:  EXAM: ??CR ABDOMEN ACUTE W PA CHEST  INDICATION: ??eval for constipation  COMPARISON: Chest x-ray, 06/07/2014.  Marland Kitchen  FINDINGS:   The upright chest radiograph demonstrates elevated right hemidiaphragm with   right basilar opacity. Linear opacity extending off the right hilum   superiorly. No visible pneumothorax or pleural effusion. ?? ??   .  Supine and upright views of the abdomen demonstrate stool in the ascending,   transverse and descending colon. No visible pneumoperitoneum. Surgical clips    in the right upper quadrant. Generative changes throughout the lumbar   spine.. ?? ??  . ??    IMPRESSION:  1. Moderate to large stool burden.  2. Elevated right hemidiaphragm with right basilar atelectasis and/or   consolidation. Linear opacity extending superiorly off the right hilum   suggests atelectasis.  ????    ??  ??  Signing/Reading Doctor: Levell July [phone]) ??  Approved: CHRISTIAN SHIELD [phone]) ??May ??8 2016 ??9:11PM ?? ?? ?? ?? ?? ?? ?? ?? ?? ??       ??   ??    ??    ?? XR SPINE LUMB 2 OR 3 V (Final result) Result time: 12/26/14 19:02:17   ?? Final result by Rad Results In Edi (12/26/14 19:02:17)   ?? Narrative:   ?? **Final Report**  ??    ICD Codes / Adm.Diagnosis: 12 ??110002 / Back Pain ??Abdominal Pain  Examination: ??CR L SPINE 2 OR 3 VWS ??- [id number] - May ??8 2016 ??6:50PM  Accession No: ??[id number]  Reason: ??Low back pain      REPORT:  EXAM: ??CR L SPINE 2 OR 3 VWS  INDICATION: ?? Low back pain  COMPARISON: 3 lumbar spine, 09/11/2013  .  FINDINGS:   AP, lateral and spot lateral views of the lumbar spine demonstrate   retrolisthesis of both L2 on L3 and L3 on L4 which is not significantly   changed relative to comparison. ??Spondylosis throughout the lumbar spine   with degenerative disc disease throughout the lumbar spine. ??No visible   fracture. Surgical clips in the right upper quadrant  . ??    IMPRESSION: ??  1. Retrolisthesis of L2 on L3 and L3 on L4, similar to comparison.  ????    ??  ??  Signing/Reading Doctor: Levell July [phone]) ??  Approved: CHRISTIAN SHIELD [phone]) ??May ??8 2016 ??7:00PM ?? ?? ?? ?? ?? ?? ?? ?? ?? ??            MEDICATIONS GIVEN:  Medications   cephALEXin (KEFLEX) capsule 500 mg (not administered)   oxyCODONE-acetaminophen (PERCOCET) 5-325 mg per tablet 2 Tab (2 Tabs Oral Given 12/26/14 1829)   diazepam (VALIUM) tablet 5 mg (5 mg Oral Given 12/26/14 1823)   HYDROmorphone (PF) (DILAUDID) injection 2 mg (2 mg IntraMUSCular Given  12/26/14 1948)   magnesium citrate solution 296 mL (296 mL Oral Given 12/26/14 2137)    ketorolac (TORADOL) injection 30 mg (30 mg IntraVENous Given 12/26/14 2333)       IMPRESSION:  1. Chronic bilateral low back pain with bilateral sciatica    2. Acute chest pain    3. Urinary tract infection without hematuria, site unspecified        PLAN:  1.   Current Discharge Medication List      START taking these medications    Details   cephALEXin (KEFLEX) 500 mg capsule Take 1 Cap by mouth four (4) times daily for 7 days.  Qty: 28 Cap, Refills: 0      HYDROcodone-acetaminophen (NORCO) 7.5-325 mg per tablet Take 1 Tab by mouth every six (6) hours as needed for Pain. Max Daily Amount: 4 Tabs.  Qty: 20 Tab, Refills: 0      oxyCODONE-acetaminophen (PERCOCET) 5-325 mg per tablet Take 1 Tab by mouth every four (4) hours as needed for Pain. Max Daily Amount: 6 Tabs.  Qty: 20 Tab, Refills: 0      cyclobenzaprine (FLEXERIL) 10 mg tablet Take 1 Tab by mouth three (3) times daily as needed for Muscle Spasm(s).  Qty: 20 Tab, Refills: 0         CONTINUE these medications which have NOT CHANGED    Details   clopidogrel (PLAVIX) 75 mg tablet Take 1 Tab by mouth daily.  Qty: 30 Tab, Refills: 0      ARIPiprazole (ABILIFY) 30 mg tablet Take 15 mg by mouth daily.      carbidopa-levodopa (SINEMET) 25-100 mg per tablet Take 1 Tab by mouth three (3) times daily.      FLUoxetine (PROZAC) 20 mg tablet Take 20 mg by mouth daily.      gabapentin (NEURONTIN) 400 mg capsule Take 400 mg by mouth every eight (8) hours.      divalproex ER (DEPAKOTE ER) 500 mg ER tablet Take 1,000 mg by mouth nightly.      docusate sodium (COLACE) 100 mg capsule Take 100 mg by mouth two (2) times a day.      CARBOXYMETHYLCELLULOS/GLYCERIN (LUBRICATING DROPS OP) Apply  to eye nightly.      mirtazapine (REMERON) 30 mg tablet Take 30 mg by mouth nightly.      omeprazole (PRILOSEC) 20 mg capsule Take 20 mg by mouth two (2) times a day.      !! tiZANidine (ZANAFLEX) 2 mg tablet Take 2 mg by mouth every morning. 1 tab in AM and 2 tab bedtime       !! tiZANidine (ZANAFLEX) 2 mg tablet Take 4 mg by mouth nightly. 1 tab in AM and 2 tabs bedtime      cyanocobalamin (VITAMIN B12) 1,000 mcg/mL injection 1,000 mcg by IntraMUSCular route every thirty (30) days.      cholecalciferol (VITAMIN D3) 1,000 unit tablet Take 1,000 Units by mouth three (3) times daily.      atorvastatin (LIPITOR) 80 mg tablet Take 40 mg by mouth daily.      acetaminophen (TYLENOL) 500 mg tablet Take 500 mg by mouth every six (6) hours as needed for Pain.      miconazole (MICOTIN) 2 % topical powder Apply  to affected area daily.       !! - Potential duplicate medications found. Please discuss with provider.      STOP taking these medications       traMADol (ULTRAM) 50 mg  tablet Comments:   Reason for Stopping:         oxyCODONE IR (OXY-IR) 15 mg immediate release tablet Comments:   Reason for Stopping:             2.   Follow-up Information     None        Return to ED if worse     Discharge Note:  12:47 AM  The patient is ready for discharge. The patient's signs, symptoms, diagnosis, and discharge instructions have been discussed and the patient has conveyed their understanding. The patient is to follow up as recommended or return to the ER should their symptoms worsen. Plan has been discussed and the patient is in agreement.      This note is prepared by Rozelle Logan II acting as Scribe for Antonieta Pert, DO.    Antonieta Pert, DO: The Scribe's documentation has been prepared under my direction and personally reviewed by me in its entirety. I confirm that the note above accurately reflects all work, treatment, procedures, and medical decision making performed by me.    This note is prepared by Charlie Pitter, acting as scribe for Glean Hess, MD.     Glean Hess, MD: The scribe's documentation has been prepared under my direction and personally reviewed by me in its entirety. I confirm that the note above accurately reflects all work, treatment, procedures, and  medical decision making performed by me

## 2014-12-26 NOTE — ED Notes (Signed)
Pt. Able to ambulate with slow, steady gait to restroom with mild assistance.

## 2014-12-26 NOTE — ED Notes (Signed)
Pt. Aware of plan to redraw cardiac enzymes 3 hrs after chest pain started. Pt. Also given mag citrate for constipation

## 2014-12-27 LAB — URINALYSIS W/ RFLX MICROSCOPIC
Bilirubin: NEGATIVE
Blood: NEGATIVE
Glucose: NEGATIVE mg/dL
Ketone: NEGATIVE mg/dL
Nitrites: NEGATIVE
Protein: NEGATIVE mg/dL
Specific gravity: 1.027 (ref 1.003–1.030)
Urobilinogen: 1 EU/dL (ref 0.2–1.0)
WBC: >100 /hpf — ABNORMAL HIGH (ref 0–4)
pH (UA): 6 (ref 5.0–8.0)

## 2014-12-27 LAB — EKG, 12 LEAD, INITIAL
Atrial Rate: 75 {beats}/min
Calculated P Axis: 48 degrees
Calculated R Axis: 37 degrees
Calculated T Axis: 42 degrees
Diagnosis: NORMAL
P-R Interval: 172 ms
Q-T Interval: 398 ms
QRS Duration: 104 ms
QTC Calculation (Bezet): 444 ms
Ventricular Rate: 75 {beats}/min

## 2014-12-27 LAB — CK W/ CKMB & INDEX
CK - MB: 0.6 NG/ML (ref 0.5–3.6)
CK-MB Index: 2 (ref 0–2.5)
CK: 30 U/L — ABNORMAL LOW (ref 39–308)

## 2014-12-27 LAB — METABOLIC PANEL, COMPREHENSIVE
A-G Ratio: 1.1 (ref 1.1–2.2)
ALT (SGPT): 12 U/L (ref 12–78)
AST (SGOT): 68 U/L — ABNORMAL HIGH (ref 15–37)
Albumin: 3 g/dL — ABNORMAL LOW (ref 3.5–5.0)
Alk. phosphatase: 95 U/L (ref 45–117)
Anion gap: 7 mmol/L (ref 5–15)
BUN/Creatinine ratio: 23 — ABNORMAL HIGH (ref 12–20)
BUN: 17 MG/DL (ref 6–20)
Bilirubin, total: 0.4 MG/DL (ref 0.2–1.0)
CO2: 29 mmol/L (ref 21–32)
Calcium: 8.3 MG/DL — ABNORMAL LOW (ref 8.5–10.1)
Chloride: 107 mmol/L (ref 97–108)
Creatinine: 0.75 MG/DL (ref 0.70–1.30)
GFR est AA: >60 mL/min/{1.73_m2} (ref >60)
GFR est non-AA: >60 mL/min/{1.73_m2} (ref >60)
Globulin: 2.7 g/dL (ref 2.0–4.0)
Glucose: 95 mg/dL (ref 65–100)
Potassium: 3.7 mmol/L (ref 3.5–5.1)
Protein, total: 5.7 g/dL — ABNORMAL LOW (ref 6.4–8.2)
Sodium: 143 mmol/L (ref 136–145)

## 2014-12-27 LAB — CBC WITH AUTOMATED DIFF
ABS. BASOPHILS: 0 10*3/uL (ref 0.0–0.1)
ABS. EOSINOPHILS: 0.1 10*3/uL (ref 0.0–0.4)
ABS. LYMPHOCYTES: 1 10*3/uL (ref 0.8–3.5)
ABS. MONOCYTES: 0.5 10*3/uL (ref 0.0–1.0)
ABS. NEUTROPHILS: 9.6 10*3/uL — ABNORMAL HIGH (ref 1.8–8.0)
BASOPHILS: 0 % (ref 0–1)
EOSINOPHILS: 0 % (ref 0–7)
HCT: 35.6 % — ABNORMAL LOW (ref 36.6–50.3)
HGB: 11.7 g/dL — ABNORMAL LOW (ref 12.1–17.0)
LYMPHOCYTES: 9 % — ABNORMAL LOW (ref 12–49)
MCH: 32.4 PG (ref 26.0–34.0)
MCHC: 32.9 g/dL (ref 30.0–36.5)
MCV: 98.6 FL (ref 80.0–99.0)
MONOCYTES: 4 % — ABNORMAL LOW (ref 5–13)
NEUTROPHILS: 87 % — ABNORMAL HIGH (ref 32–75)
PLATELET: 134 10*3/uL — ABNORMAL LOW (ref 150–400)
RBC: 3.61 M/uL — ABNORMAL LOW (ref 4.10–5.70)
RDW: 14.1 % (ref 11.5–14.5)
WBC: 11.2 10*3/uL — ABNORMAL HIGH (ref 4.1–11.1)

## 2014-12-27 LAB — TROPONIN I
Troponin-I, Qt.: <0.04 ng/mL (ref <0.05)
Troponin-I, Qt.: <0.04 ng/mL (ref <0.05)

## 2014-12-27 LAB — CK W/ REFLX CKMB: CK: 30 U/L — ABNORMAL LOW (ref 39–308)

## 2014-12-27 MED ORDER — MORPHINE 4 MG/ML SYRINGE
4 mg/mL | INTRAMUSCULAR | Status: DC
Start: 2014-12-27 — End: 2014-12-27

## 2014-12-27 MED ORDER — KETOROLAC TROMETHAMINE 30 MG/ML INJECTION
30 mg/mL (1 mL) | INTRAMUSCULAR | Status: AC
Start: 2014-12-27 — End: 2014-12-26
  Administered 2014-12-27: 04:00:00 via INTRAVENOUS

## 2014-12-27 MED ORDER — MAGNESIUM CITRATE ORAL SOLN
ORAL | Status: AC
Start: 2014-12-27 — End: 2014-12-26
  Administered 2014-12-27: 02:00:00 via ORAL

## 2014-12-27 MED ORDER — OXYCODONE-ACETAMINOPHEN 5 MG-325 MG TAB
5-325 mg | ORAL_TABLET | ORAL | Status: DC | PRN
Start: 2014-12-27 — End: 2015-07-26

## 2014-12-27 MED ORDER — CEPHALEXIN 250 MG CAP
250 mg | ORAL | Status: AC
Start: 2014-12-27 — End: 2014-12-27
  Administered 2014-12-27: 05:00:00 via ORAL

## 2014-12-27 MED ORDER — CEPHALEXIN 500 MG CAP
500 mg | ORAL_CAPSULE | Freq: Four times a day (QID) | ORAL | Status: AC
Start: 2014-12-27 — End: 2015-01-03

## 2014-12-27 MED ORDER — CYCLOBENZAPRINE 10 MG TAB
10 mg | ORAL_TABLET | Freq: Three times a day (TID) | ORAL | Status: DC | PRN
Start: 2014-12-27 — End: 2015-07-25

## 2014-12-27 MED ORDER — HYDROCODONE-ACETAMINOPHEN 7.5 MG-325 MG TAB
ORAL_TABLET | Freq: Four times a day (QID) | ORAL | Status: DC | PRN
Start: 2014-12-27 — End: 2014-12-27

## 2014-12-27 MED ORDER — MORPHINE 2 MG/ML INJECTION
2 mg/mL | INTRAMUSCULAR | Status: AC
Start: 2014-12-27 — End: 2014-12-27
  Administered 2014-12-27: 05:00:00 via INTRAVENOUS

## 2014-12-27 MED FILL — CITROMA ORAL SOLUTION: ORAL | Qty: 296

## 2014-12-27 MED FILL — CEPHALEXIN 250 MG CAP: 250 mg | ORAL | Qty: 2

## 2014-12-27 MED FILL — MORPHINE 4 MG/ML SYRINGE: 4 mg/mL | INTRAMUSCULAR | Qty: 1

## 2014-12-27 MED FILL — KETOROLAC TROMETHAMINE 30 MG/ML INJECTION: 30 mg/mL (1 mL) | INTRAMUSCULAR | Qty: 1

## 2014-12-27 NOTE — ED Notes (Signed)
Pt ambulatory to restroom. No other complaints voiced at this time. Pt back to bed with x1 assist with no other complaints.

## 2014-12-27 NOTE — ED Notes (Signed)
[facility] contacted regarding ETA of transport. Dispatcher stated that patient has not been assigned to a transporter at this time. Patient currently resting comfortably. O2 sats will decrease when patient falls asleep. 2LNC provided for patient's comfort while sleeping. According to patient he uses 2LNC at home due to sleep apnea. Call bell within reach. Patient updated on transportation.

## 2014-12-27 NOTE — ED Notes (Signed)
Discharge medications reviewed with patient by discharging MD and appropriate educational materials and side effects teaching were provided. Pt left for discharge home via stretcher with [facility].

## 2014-12-27 NOTE — ED Notes (Signed)
Patient resting comfortably. Will schedule ride home through [employer]. Call bell within reach.

## 2014-12-27 NOTE — ED Notes (Signed)
Pt sleeping in bed. Call bell within reach. Pt awaiting [employer]. No other complaints voiced at this time.

## 2014-12-27 NOTE — ED Notes (Addendum)
Bedside shift change report given to Eugene Garnet, RN (oncoming nurse) by Loni Muse, RN (offgoing nurse). Report included the following information ED Summary. Pt awaiting transportation by [employer].

## 2014-12-27 NOTE — ED Notes (Signed)
Bedside and Verbal shift change report given to Victor (oncoming nurse) by Toribio Harbour RN (offgoing nurse). Report included the following information SBAR, ED Summary, MAR and Recent Results.

## 2014-12-27 NOTE — ED Notes (Signed)
Assumed care of pt. Pt resting in stretcher. Call bell within reach. Pt awaiting transport via [facility]. Pt currently resting in bed at this time.

## 2014-12-27 NOTE — ED Notes (Signed)
[facility] called for pick up. No ETA at this time. Was informed by service that they would call when an ETA is established. Reference number: [id number]

## 2015-01-10 ENCOUNTER — Inpatient Hospital Stay: Admit: 2015-01-10 | Discharge: 2015-01-10 | Payer: MEDICAID | Attending: Emergency Medicine

## 2015-01-10 NOTE — ED Notes (Signed)
Per registration [facility].

## 2015-07-03 ENCOUNTER — Emergency Department: Admit: 2015-07-04 | Payer: MEDICAID | Primary: Family Medicine

## 2015-07-03 DIAGNOSIS — R0789 Other chest pain: Secondary | ICD-10-CM

## 2015-07-03 NOTE — ED Provider Notes (Signed)
Patient is a 64 y.o. male presenting with chest pain. The history is provided by the patient and the EMS personnel.   Chest Pain (Angina)    This is a new problem. Episode onset: Pt states around 5p he started having achiness in his L. arm.  Around 9p pt c/o L. sided CP radiating to the arm, jaw and back.  pt states "it feels like an elephant sitting on my chest and I can't catch my breath" The problem has not changed since onset.The problem occurs constantly. The pain is associated with rest. The pain is present in the left side. The pain is at a severity of 9/10. The quality of the pain is described as pressure-like. The pain radiates to the upper back, left jaw, left neck, left shoulder and left arm. Exacerbated by: nothing. Associated symptoms include back pain, diaphoresis, nausea and shortness of breath. Pertinent negatives include no abdominal pain, no cough, no dizziness, no fever, no lower extremity edema, no sputum production, no vomiting and no weakness. He has tried aspirin for the symptoms. The treatment provided no relief. Risk factors include male gender and cardiac disease. His past medical history is significant for CHF.Past medical history comments: parkinson's, TIA, CAD, anxiety, hiatal hernia. Procedural history includes cardiac catheterization, echocardiogram, cardiac stents and CABG.       Past Medical History:   Diagnosis Date   ??? Back pain      chronic   ??? CAD (coronary artery disease)      MI 2008   ??? Heart failure (HCC)      CHF   ??? Hiatal hernia    ??? Parkinson's disease ([place])    ??? Psychiatric disorder      anxiety   ??? TIA (transient ischemic attack) 01/2014       Past Surgical History:   Procedure Laterality Date   ??? Hx orthopaedic       back surgery x4, last June 2010   ??? Hx appendectomy     ??? Hx cholecystectomy     ??? Hx hernia repair     ??? Pr cardiac surg procedure unlist       cardiac cath August 2009   ??? Pr cardiac surg procedure unlist  2010     x 1 stent[facility]    ??? Pr cardiac surg procedure unlist       insertion of loop recorder removed 06/2014         Family History:   Problem Relation Age of Onset   ??? Stroke Father    ??? Hypertension Mother        Social History     Social History   ??? Marital status: WIDOWED     Spouse name: N/A   ??? Number of children: N/A   ??? Years of education: N/A     Occupational History   ??? Not on file.     Social History Main Topics   ??? Smoking status: Never Smoker   ??? Smokeless tobacco: Not on file   ??? Alcohol use No   ??? Drug use: No   ??? Sexual activity: Not on file     Other Topics Concern   ??? Not on file     Social History Narrative         ALLERGIES: Seafood [shellfish containing products]; Sulfa (sulfonamide antibiotics); and Zoloft [sertraline]    Review of Systems   Constitutional: Positive for diaphoresis. Negative for fever.   Respiratory: Positive  for shortness of breath. Negative for cough, sputum production, wheezing and stridor.    Cardiovascular: Positive for chest pain.   Gastrointestinal: Positive for nausea. Negative for abdominal pain and vomiting.   Musculoskeletal: Positive for back pain.   Skin: Negative.    Neurological: Positive for tremors (parkinson's). Negative for dizziness and weakness.   Psychiatric/Behavioral: Negative.    All other systems reviewed and are negative.      Vitals:    07/03/15 2318   BP: 109/79   Pulse: (!) 58   Resp: 20   Temp: 97.7 ??F (36.5 ??C)   SpO2: 97%   Weight: 99.5 kg (219 lb 7 oz)   Height: 6\' 1"  (1.854 m)            Physical Exam   Constitutional: He is oriented to person, place, and time. He appears well-developed and well-nourished. No distress.   HENT:   Head: Normocephalic and atraumatic.   Mouth/Throat: Oropharynx is clear and moist.   Eyes: Conjunctivae are normal.   Cardiovascular: Normal rate, regular rhythm and normal heart sounds.    Pulmonary/Chest: Effort normal and breath sounds normal. No respiratory distress. He has no wheezes. He has no rales.    Abdominal: Soft. Bowel sounds are normal. He exhibits no distension. There is no tenderness. There is no rebound and no guarding.   Musculoskeletal: Normal range of motion.   Neurological: He is alert and oriented to person, place, and time. He displays tremor (parkinsonian - mostly of L. hand). GCS eye subscore is 4. GCS verbal subscore is 5. GCS motor subscore is 6.   Skin: Skin is warm and dry.   Psychiatric: He has a normal mood and affect. His behavior is normal. Judgment and thought content normal.   Nursing note and vitals reviewed.       MDM  Number of Diagnoses or Management Options  Diagnosis management comments: DDX: ACS, unstable v stable angina, electrolyte imbalance, CHF, PNA    Progress Note:  12:15 AM  Received call from radiologist with concern for free air under R hemidiaphragm.  CT abd/pelv recommended.  Pt updated on plan.  Still c/o CP - 1SL nitro given at this time.  IV attempt in process, labs pending.  RN advised to hold SL nitro until IV established and bolus can be started.    12:24 AM  Care turned over to attending, Dr Nedra Hai, at this time.       Amount and/or Complexity of Data Reviewed  Clinical lab tests: ordered and reviewed  Tests in the radiology section of CPT??: ordered and reviewed  Decide to obtain previous medical records or to obtain history from someone other than the patient: yes  Review and summarize past medical records: yes  Discuss the patient with other providers: yes  Independent visualization of images, tracings, or specimens: yes (EKG by attending, Dr Nedra Hai)      ED Course       Procedures    ED EKG interpretation:  Rhythm: normal sinus rhythm; and regular . Rate (approx.): 61; Axis: normal; P wave: normal; QRS interval: normal ; ST/T wave: normal; Other findings: unchanged from previous ekg 12/2014. This EKG was interpreted by Darnelle Maffucci, PA-C,ED Provider.

## 2015-07-03 NOTE — ED Notes (Signed)
Patient presented to the ED today for complaints of left sided chest pain.  Patient says his left arm started to hurt first. Patient says then he started to have chest pain.  Patient says symptoms started at 10 pm tonight. Patient rates pain 9/10 and pressure  sensation.  Respirations are even and unlabroed. Skin is dry,warm, and intact.

## 2015-07-04 ENCOUNTER — Observation Stay: Admit: 2015-07-04 | Payer: MEDICAID | Primary: Family Medicine

## 2015-07-04 ENCOUNTER — Emergency Department: Admit: 2015-07-04 | Payer: MEDICAID | Primary: Family Medicine

## 2015-07-04 ENCOUNTER — Inpatient Hospital Stay
Admit: 2015-07-04 | Discharge: 2015-07-04 | Disposition: A | Discharge status: Home / Self Care | Payer: MEDICAID | Attending: Internal Medicine

## 2015-07-04 LAB — STRESS TEST CARDIAC
ECG Interp. Before Exercise: NORMAL
Max. Diastolic BP: 81 mmHg
Max. Heart rate: 106 {beats}/min
Max. Systolic BP: 135 mmHg
Peak Ex METs: 1 METS

## 2015-07-04 LAB — METABOLIC PANEL, COMPREHENSIVE
A-G Ratio: 1 — ABNORMAL LOW (ref 1.1–2.2)
ALT (SGPT): 16 U/L (ref 12–78)
AST (SGOT): 17 U/L (ref 15–37)
Albumin: 2.9 g/dL — ABNORMAL LOW (ref 3.5–5.0)
Alk. phosphatase: 101 U/L (ref 45–117)
Anion gap: 4 mmol/L — ABNORMAL LOW (ref 5–15)
BUN/Creatinine ratio: 19 (ref 12–20)
BUN: 19 MG/DL (ref 6–20)
Bilirubin, total: 0.4 MG/DL (ref 0.2–1.0)
CO2: 31 mmol/L (ref 21–32)
Calcium: 8.3 MG/DL — ABNORMAL LOW (ref 8.5–10.1)
Chloride: 108 mmol/L (ref 97–108)
Creatinine: 1 MG/DL (ref 0.70–1.30)
GFR est AA: >60 mL/min/{1.73_m2} (ref >60)
GFR est non-AA: >60 mL/min/{1.73_m2} (ref >60)
Globulin: 2.9 g/dL (ref 2.0–4.0)
Glucose: 96 mg/dL (ref 65–100)
Potassium: 3.8 mmol/L (ref 3.5–5.1)
Protein, total: 5.8 g/dL — ABNORMAL LOW (ref 6.4–8.2)
Sodium: 143 mmol/L (ref 136–145)

## 2015-07-04 LAB — CBC WITH AUTOMATED DIFF
ABS. BASOPHILS: 0 10*3/uL (ref 0.0–0.1)
ABS. EOSINOPHILS: 0.1 10*3/uL (ref 0.0–0.4)
ABS. LYMPHOCYTES: 1.1 10*3/uL (ref 0.8–3.5)
ABS. MONOCYTES: 0.3 10*3/uL (ref 0.0–1.0)
ABS. NEUTROPHILS: 3.9 10*3/uL (ref 1.8–8.0)
BASOPHILS: 0 % (ref 0–1)
EOSINOPHILS: 2 % (ref 0–7)
HCT: 39.2 % (ref 36.6–50.3)
HGB: 12.7 g/dL (ref 12.1–17.0)
LYMPHOCYTES: 20 % (ref 12–49)
MCH: 30.7 PG (ref 26.0–34.0)
MCHC: 32.4 g/dL (ref 30.0–36.5)
MCV: 94.7 FL (ref 80.0–99.0)
MONOCYTES: 6 % (ref 5–13)
NEUTROPHILS: 72 % (ref 32–75)
PLATELET: 130 10*3/uL — ABNORMAL LOW (ref 150–400)
RBC: 4.14 M/uL (ref 4.10–5.70)
RDW: 13.3 % (ref 11.5–14.5)
WBC: 5.4 10*3/uL (ref 4.1–11.1)

## 2015-07-04 LAB — CK W/ CKMB & INDEX
CK - MB: 1.9 NG/ML (ref 0.5–3.6)
CK-MB Index: 4 — ABNORMAL HIGH (ref 0–2.5)
CK: 48 U/L (ref 39–308)

## 2015-07-04 LAB — TROPONIN I
Troponin-I, Qt.: <0.04 ng/mL (ref <0.05)
Troponin-I, Qt.: <0.04 ng/mL (ref <0.05)

## 2015-07-04 LAB — SED RATE (ESR): Sed rate, automated: 11 mm/hr (ref 0–20)

## 2015-07-04 LAB — VALPROIC ACID: Valproic acid: <3 ug/ml — ABNORMAL LOW (ref 50–100)

## 2015-07-04 LAB — CK W/ REFLX CKMB: CK: 55 U/L (ref 39–308)

## 2015-07-04 LAB — NT-PRO BNP: NT pro-BNP: 145 PG/ML — ABNORMAL HIGH (ref 0–125)

## 2015-07-04 LAB — POC TROPONIN-I: Troponin-I (POC): <0.04 ng/mL (ref 0.00–0.08)

## 2015-07-04 MED ORDER — MIRTAZAPINE 15 MG TAB
15 mg | Freq: Every evening | ORAL | Status: DC
Start: 2015-07-04 — End: 2015-07-05
  Administered 2015-07-04 – 2015-07-05 (×2): via ORAL

## 2015-07-04 MED ORDER — TIZANIDINE 2 MG TAB
2 mg | Freq: Every day | ORAL | Status: DC
Start: 2015-07-04 — End: 2015-07-04

## 2015-07-04 MED ORDER — SODIUM CHLORIDE 0.9 % IJ SYRG
Freq: Three times a day (TID) | INTRAMUSCULAR | Status: DC
Start: 2015-07-04 — End: 2015-07-05
  Administered 2015-07-04 – 2015-07-05 (×5): via INTRAVENOUS

## 2015-07-04 MED ORDER — MORPHINE 10 MG/ML INJ SOLUTION
10 mg/ml | Freq: Four times a day (QID) | INTRAMUSCULAR | Status: DC | PRN
Start: 2015-07-04 — End: 2015-07-05
  Administered 2015-07-04 – 2015-07-05 (×3): via INTRAVENOUS

## 2015-07-04 MED ORDER — REGADENOSON 0.4 MG/5 ML IV SYRINGE
0.4 mg/5 mL | INTRAVENOUS | Status: AC
Start: 2015-07-04 — End: 2015-07-04
  Administered 2015-07-04: 17:00:00

## 2015-07-04 MED ORDER — ENOXAPARIN 40 MG/0.4 ML SUB-Q SYRINGE
40 mg/0.4 mL | Freq: Every day | SUBCUTANEOUS | Status: DC
Start: 2015-07-04 — End: 2015-07-05
  Administered 2015-07-04 – 2015-07-05 (×2): via SUBCUTANEOUS

## 2015-07-04 MED ORDER — NALOXONE 0.4 MG/ML INJECTION
0.4 mg/mL | INTRAMUSCULAR | Status: DC | PRN
Start: 2015-07-04 — End: 2015-07-05

## 2015-07-04 MED ORDER — ARIPIPRAZOLE 5 MG TAB
5 mg | Freq: Every day | ORAL | Status: DC
Start: 2015-07-04 — End: 2015-07-05
  Administered 2015-07-04 – 2015-07-05 (×2): via ORAL

## 2015-07-04 MED ORDER — ACETAMINOPHEN 325 MG TABLET
325 mg | ORAL | Status: DC | PRN
Start: 2015-07-04 — End: 2015-07-05

## 2015-07-04 MED ORDER — FENTANYL CITRATE (PF) 50 MCG/ML IJ SOLN
50 mcg/mL | INTRAMUSCULAR | Status: AC
Start: 2015-07-04 — End: 2015-07-04
  Administered 2015-07-04: 07:00:00 via INTRAVENOUS

## 2015-07-04 MED ORDER — SODIUM CHLORIDE 0.9 % IJ SYRG
INTRAMUSCULAR | Status: DC | PRN
Start: 2015-07-04 — End: 2015-07-05

## 2015-07-04 MED ORDER — CHOLECALCIFEROL (VITAMIN D3) 1,000 UNIT (25 MCG) TAB
Freq: Three times a day (TID) | ORAL | Status: DC
Start: 2015-07-04 — End: 2015-07-05
  Administered 2015-07-04 – 2015-07-05 (×4): via ORAL

## 2015-07-04 MED ORDER — DIVALPROEX 500 MG 24 HR TAB
500 mg | Freq: Every evening | ORAL | Status: DC
Start: 2015-07-04 — End: 2015-07-05
  Administered 2015-07-05: 04:00:00 via ORAL

## 2015-07-04 MED ORDER — CLOPIDOGREL 75 MG TAB
75 mg | Freq: Every day | ORAL | Status: DC
Start: 2015-07-04 — End: 2015-07-05
  Administered 2015-07-04 – 2015-07-05 (×2): via ORAL

## 2015-07-04 MED ORDER — KETOROLAC TROMETHAMINE 30 MG/ML INJECTION
30 mg/mL (1 mL) | Freq: Two times a day (BID) | INTRAMUSCULAR | Status: DC | PRN
Start: 2015-07-04 — End: 2015-07-05
  Administered 2015-07-05: 18:00:00 via INTRAVENOUS

## 2015-07-04 MED ORDER — NITROGLYCERIN 0.4 MG SUBLINGUAL TAB
0.4 mg | SUBLINGUAL | Status: DC | PRN
Start: 2015-07-04 — End: 2015-07-05
  Administered 2015-07-04 (×2): via SUBLINGUAL

## 2015-07-04 MED ORDER — CYANOCOBALAMIN 1,000 MCG/ML IJ SOLN
1000 mcg/mL | INTRAMUSCULAR | Status: DC
Start: 2015-07-04 — End: 2015-07-05
  Administered 2015-07-05: 14:00:00 via INTRAMUSCULAR

## 2015-07-04 MED ORDER — CYCLOBENZAPRINE 10 MG TAB
10 mg | Freq: Three times a day (TID) | ORAL | Status: DC | PRN
Start: 2015-07-04 — End: 2015-07-05

## 2015-07-04 MED ORDER — GABAPENTIN 300 MG CAP
300 mg | Freq: Three times a day (TID) | ORAL | Status: DC
Start: 2015-07-04 — End: 2015-07-05
  Administered 2015-07-04 – 2015-07-05 (×5): via ORAL

## 2015-07-04 MED ORDER — ONDANSETRON (PF) 4 MG/2 ML INJECTION
4 mg/2 mL | INTRAMUSCULAR | Status: DC | PRN
Start: 2015-07-04 — End: 2015-07-05

## 2015-07-04 MED ORDER — REGADENOSON 0.4 MG/5 ML IV SYRINGE
0.4 mg/5 mL | Freq: Once | INTRAVENOUS | Status: AC
Start: 2015-07-04 — End: 2015-07-04
  Administered 2015-07-04: 17:00:00 via INTRAVENOUS

## 2015-07-04 MED ORDER — NITROGLYCERIN 0.4 MG SUBLINGUAL TAB
0.4 mg | SUBLINGUAL | Status: DC | PRN
Start: 2015-07-04 — End: 2015-07-05

## 2015-07-04 MED ORDER — TIZANIDINE 4 MG TAB
4 mg | Freq: Every day | ORAL | Status: DC
Start: 2015-07-04 — End: 2015-07-05
  Administered 2015-07-04 – 2015-07-05 (×2): via ORAL

## 2015-07-04 MED ORDER — SODIUM CHLORIDE 0.9% BOLUS IV
0.9 % | Freq: Once | INTRAVENOUS | Status: AC
Start: 2015-07-04 — End: 2015-07-04
  Administered 2015-07-04: 06:00:00 via INTRAVENOUS

## 2015-07-04 MED ORDER — OXYCODONE-ACETAMINOPHEN 5 MG-325 MG TAB
5-325 mg | ORAL | Status: DC | PRN
Start: 2015-07-04 — End: 2015-07-05
  Administered 2015-07-04 – 2015-07-05 (×3): via ORAL

## 2015-07-04 MED ORDER — ASPIRIN 325 MG TAB
325 mg | Freq: Once | ORAL | Status: AC
Start: 2015-07-04 — End: 2015-07-04
  Administered 2015-07-04: 08:00:00 via ORAL

## 2015-07-04 MED ORDER — NITROGLYCERIN 2 % TRANSDERMAL OINTMENT
2 % | TRANSDERMAL | Status: AC
Start: 2015-07-04 — End: 2015-07-04
  Administered 2015-07-04: 18:00:00 via TOPICAL

## 2015-07-04 MED ORDER — PANTOPRAZOLE 40 MG TAB, DELAYED RELEASE
40 mg | Freq: Two times a day (BID) | ORAL | Status: DC
Start: 2015-07-04 — End: 2015-07-05
  Administered 2015-07-04 – 2015-07-05 (×3): via ORAL

## 2015-07-04 MED ORDER — TECHNETIUM TC 99M SESTAMIBI - CARDIOLITE
Freq: Once | Status: AC
Start: 2015-07-04 — End: 2015-07-04
  Administered 2015-07-04: 15:00:00 via INTRAVENOUS

## 2015-07-04 MED ORDER — NITROGLYCERIN 2 % TRANSDERMAL OINTMENT
2 % | Freq: Two times a day (BID) | TRANSDERMAL | Status: DC
Start: 2015-07-04 — End: 2015-07-05
  Administered 2015-07-04: 23:00:00 via TOPICAL

## 2015-07-04 MED ORDER — TECHNETIUM TC 99M SESTAMIBI - CARDIOLITE
Freq: Once | Status: AC
Start: 2015-07-04 — End: 2015-07-04
  Administered 2015-07-04: 18:00:00 via INTRAVENOUS

## 2015-07-04 MED ORDER — FLUOXETINE 20 MG CAP
20 mg | Freq: Every day | ORAL | Status: DC
Start: 2015-07-04 — End: 2015-07-05
  Administered 2015-07-04 – 2015-07-05 (×2): via ORAL

## 2015-07-04 MED ORDER — ATORVASTATIN 40 MG TAB
40 mg | Freq: Every day | ORAL | Status: DC
Start: 2015-07-04 — End: 2015-07-05
  Administered 2015-07-04 – 2015-07-05 (×2): via ORAL

## 2015-07-04 MED ORDER — CARBIDOPA-LEVODOPA 25 MG-100 MG TAB
25-100 mg | Freq: Three times a day (TID) | ORAL | Status: DC
Start: 2015-07-04 — End: 2015-07-05
  Administered 2015-07-04 – 2015-07-05 (×4): via ORAL

## 2015-07-04 MED FILL — MORPHINE 10 MG/ML SYRINGE: 10 mg/mL | INTRAMUSCULAR | Qty: 1

## 2015-07-04 MED FILL — SODIUM CHLORIDE 0.9 % IV: INTRAVENOUS | Qty: 250

## 2015-07-04 MED FILL — TIZANIDINE 2 MG TAB: 2 mg | ORAL | Qty: 1

## 2015-07-04 MED FILL — NITRO-BID 2 % TRANSDERMAL OINTMENT: 2 % | TRANSDERMAL | Qty: 1

## 2015-07-04 MED FILL — TIZANIDINE 4 MG TAB: 4 mg | ORAL | Qty: 1

## 2015-07-04 MED FILL — ASPIRIN 325 MG TAB: 325 mg | ORAL | Qty: 1

## 2015-07-04 MED FILL — CLOPIDOGREL 75 MG TAB: 75 mg | ORAL | Qty: 1

## 2015-07-04 MED FILL — PROTONIX 40 MG TABLET,DELAYED RELEASE: 40 mg | ORAL | Qty: 1

## 2015-07-04 MED FILL — VITAMIN D3 25 MCG (1,000 UNIT) TABLET: 25 mcg (1,000 unit) | ORAL | Qty: 1

## 2015-07-04 MED FILL — ATORVASTATIN 40 MG TAB: 40 mg | ORAL | Qty: 1

## 2015-07-04 MED FILL — ABILIFY 5 MG TABLET: 5 mg | ORAL | Qty: 3

## 2015-07-04 MED FILL — CARBIDOPA-LEVODOPA 25 MG-100 MG TAB: 25-100 mg | ORAL | Qty: 1

## 2015-07-04 MED FILL — FLUOXETINE 20 MG CAP: 20 mg | ORAL | Qty: 1

## 2015-07-04 MED FILL — NITROSTAT 0.4 MG SUBLINGUAL TABLET: 0.4 mg | SUBLINGUAL | Qty: 1

## 2015-07-04 MED FILL — ENOXAPARIN 40 MG/0.4 ML SUB-Q SYRINGE: 40 mg/0.4 mL | SUBCUTANEOUS | Qty: 0.4

## 2015-07-04 MED FILL — FENTANYL CITRATE (PF) 50 MCG/ML IJ SOLN: 50 mcg/mL | INTRAMUSCULAR | Qty: 2

## 2015-07-04 MED FILL — MIRTAZAPINE 15 MG TAB: 15 mg | ORAL | Qty: 2

## 2015-07-04 MED FILL — LEXISCAN 0.4 MG/5 ML INTRAVENOUS SYRINGE: 0.4 mg/5 mL | INTRAVENOUS | Qty: 5

## 2015-07-04 MED FILL — BD POSIFLUSH NORMAL SALINE 0.9 % INJECTION SYRINGE: INTRAMUSCULAR | Qty: 10

## 2015-07-04 MED FILL — OXYCODONE-ACETAMINOPHEN 5 MG-325 MG TAB: 5-325 mg | ORAL | Qty: 1

## 2015-07-04 MED FILL — GABAPENTIN 100 MG CAP: 100 mg | ORAL | Qty: 1

## 2015-07-04 NOTE — Progress Notes (Signed)
Bedside shift change report given to Liza,RN (oncoming nurse) by Iiyochi,RN (offgoing nurse). Report included the following information SBAR, Kardex, Intake/Output, MAR and Recent Results.

## 2015-07-04 NOTE — ED Notes (Signed)
Patient's blood pressure decreased.Second dose of Nitroglycerin held.

## 2015-07-04 NOTE — ED Notes (Signed)
Patient discussed with Dr. Lee(telehospitalist)

## 2015-07-04 NOTE — Progress Notes (Addendum)
0710hrs .Marland KitchenBedside and Verbal shift change report given to [employer] Games developer) by Drema Pry (offgoing nurse). Report included the following information SBAR, Kardex, Intake/Output, MAR, Recent Results, Med Rec Status and Cardiac Rhythm nsr.     Confirmed with Dr. Claiborne Billings that CKMB and Troponin lab draw order need not to be redrawn.     Patient completed Stress test today.  Patient still complained of chest pain on left side that radiated to left arm (which was her baseline). Dr. Claiborne Billings was aware of this. Pain medication was administered.    1920hrs .Marland KitchenBedside and Verbal shift change report given to Iiyochi Games developer) by Learta Codding (offgoing nurse). Report included the following information SBAR, Kardex, Intake/Output, MAR, Recent Results, Med Rec Status and Cardiac Rhythm NSR.

## 2015-07-04 NOTE — Progress Notes (Signed)
Spiritual Care Assessment/Progress Notes    Dale Weaver [id number]  [id number]    1950-11-10  64 y.o.  male    Patient Telephone Number: [phone] (home)   Religious Affiliation: Catholic   Language: English   Extended Emergency Contact Information  Primary Emergency Contact: Dale Weaver Phone: [phone]  Relation: Friend   Patient Active Problem List    Diagnosis Date Noted   ??? GERD (gastroesophageal reflux disease) 07/04/2015   ??? HTN (hypertension), benign 07/04/2015   ??? Parkinson disease ([place]) 07/04/2015   ??? TIA (transient ischemic attack) 07/04/2015   ??? Conversion disorder 12/02/2014   ??? Chest pain 06/01/2014   ??? Dyslipidemia 06/01/2014   ??? S/P angioplasty with stent--2008 06/01/2014   ??? Status post placement of implantable loop recorder 2013 06/01/2014   ??? 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 H/O alcohol abuse--alcohol-free since 3 yrs ago 06/01/2014   ??? 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 CVA (cerebral infarction) 01/20/2014        Date: 07/04/2015       Level of Religious/Spiritual Activity:           Involved in faith tradition/spiritual practice             Not involved in faith tradition/spiritual practice           Spiritually oriented             Claims no spiritual orientation             seeking spiritual identity           Feels alienated from religious practice/tradition           Feels angry about religious practice/tradition           Spirituality/religious PRAYER IS a resource for coping at this time.           Not able to assess due to medical condition    Services Provided Today:           crisis intervention             reading Scriptures           spiritual assessment             prayer           empathic listening/emotional support           rites and rituals (cite in comments)           life review              religious support           theological development            advocacy            ethical dialog              blessing           bereavement support             support to family           anticipatory grief support            help with AMD           spiritual guidance             meditation      Spiritual Care Needs  Emotional Support           Spiritual/Religious Care           Loss/Adjustment           Advocacy/Referral                /Ethics           No needs expressed at               this time           Other: (note in               comments)  Spiritual Care Plan           Follow up visits with               pt/family           Provide materials           Schedule sacraments           Contact Community               Clergy           Follow up as needed           Other: (note in               comments)     Comments: Initial spiritual assessment in Med Surg/Tele Unit. Dale Weaver shared he has strong belief in God and shared some times God has blessed him.  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  He has been blessed by a friend who gave him a place to stay.  He is hopeful they will find what is going on with his health so he can feel better.    Provided spiritual presence and prayer.  Advised of Chaplain Availability.  Visited by: Ashley Mariner [place] [phone])

## 2015-07-04 NOTE — H&P (Addendum)
[facility]  Admission History and Physical      NAME:  Dale Weaver   DOB:   May 02, 1951   MRN:  [id number]     PCP:  Lacey Jensen, MD     Date/Time:  07/04/2015      PATIENT ADMITTED OVERNIGHT BY THE TELEHOSPITALIST    CHIEF COMPLAINT: CHEST PAIN    HISTORY OF PRESENT ILLNESS:        Dictation on: 07/04/2015  3:08 PM by: Ree Kida [id number]       Mr. Vajda is a 64 y.o. Caucasian male with a past medical history of CAD status post PCI/stent 2009 and MI in 2012, hypertension, hyperlipidemia, Parkinson's disease who comes to the emergency department via ambulance with chest pain. As per patient, around 10:00 PM last evening he started having acute onset chest pain 10/10 severe in intensity, pressure-like  in nature while resting. The pain was also associated with nausea and diaphoresis. The patient has a pretty prominent cardiac history in the past and is normally followed by a cardiologist at [facility] in [place]. He also states that he had an implanted loop recorder for frequent palpitations but that was removed last year in November.   ??  The patient states that his chest pain is not aggravated by any pain but alleviated by baby aspirin that he took at home . He also received 3 more baby aspirin and nitro in the emergency department along with morphine which helped alleviate his pain and brought it down to a level of 7/10.          Past Medical History   Diagnosis Date   ??? Back pain      chronic   ??? CAD (coronary artery disease)      MI 2008   ??? Heart failure (HCC)      CHF   ??? Hiatal hernia    ??? Parkinson's disease ([place])    ??? Psychiatric disorder      anxiety   ??? TIA (transient ischemic attack) 01/2014        Past Surgical History   Procedure Laterality Date   ??? Hx orthopaedic       back surgery x4, last June 2010   ??? Hx appendectomy     ??? Hx cholecystectomy     ??? Hx hernia repair     ??? Pr cardiac surg procedure unlist       cardiac cath August 2009    ??? Pr cardiac surg procedure unlist  2010     x 1 [facility]   ??? Pr cardiac surg procedure unlist       insertion of loop recorder removed 06/2014       Social History   Substance Use Topics   ??? Smoking status: Never Smoker   ??? Smokeless tobacco: Not on file   ??? Alcohol use No        Family History   Problem Relation Age of Onset   ??? Stroke Father    ??? Hypertension Mother         Allergies   Allergen Reactions   ??? Seafood [Shellfish Containing Products] Hives     Allergy to fish only.  Can eat shrimp.  No allergy to IV contrast.   ??? Sulfa (Sulfonamide Antibiotics) Hives   ??? Zoloft [Sertraline] Itching        Prior to Admission medications    Medication Sig Start  Date End Date Taking? Authorizing Provider   oxyCODONE-acetaminophen (PERCOCET) 5-325 mg per tablet Take 1 Tab by mouth every four (4) hours as needed for Pain. Max Daily Amount: 6 Tabs. 12/26/14  Yes Antonieta Pert, DO   ARIPiprazole (ABILIFY) 30 mg tablet Take 15 mg by mouth daily.   Yes Phys Other, MD   carbidopa-levodopa (SINEMET) 25-100 mg per tablet Take 1 Tab by mouth three (3) times daily.   Yes Phys Other, MD   FLUoxetine (PROZAC) 20 mg tablet Take 20 mg by mouth daily.   Yes Phys Other, MD   gabapentin (NEURONTIN) 400 mg capsule Take 400 mg by mouth every eight (8) hours.   Yes Phys Other, MD   divalproex ER (DEPAKOTE ER) 500 mg ER tablet Take 1,000 mg by mouth nightly.   Yes Phys Other, MD   mirtazapine (REMERON) 30 mg tablet Take 30 mg by mouth nightly.   Yes Phys Other, MD   tiZANidine (ZANAFLEX) 2 mg tablet Take 2 mg by mouth every morning. 1 tab in AM and 2 tab bedtime   Yes Phys Other, MD   cyanocobalamin (VITAMIN B12) 1,000 mcg/mL injection 1,000 mcg by IntraMUSCular route every thirty (30) days.   Yes Phys Other, MD   cholecalciferol (VITAMIN D3) 1,000 unit tablet Take 1,000 Units by mouth three (3) times daily.   Yes Phys Other, MD   atorvastatin (LIPITOR) 80 mg tablet Take 40 mg by mouth daily.   Yes Phys Other, MD    cyclobenzaprine (FLEXERIL) 10 mg tablet Take 1 Tab by mouth three (3) times daily as needed for Muscle Spasm(s). 12/26/14   Antonieta Pert, DO   clopidogrel (PLAVIX) 75 mg tablet Take 1 Tab by mouth daily. 12/02/14   Evelene Croon, MD   docusate sodium (COLACE) 100 mg capsule Take 100 mg by mouth two (2) times a day.    Phys Other, MD   CARBOXYMETHYLCELLULOS/GLYCERIN (LUBRICATING DROPS OP) Apply  to eye nightly.    Phys Other, MD   omeprazole (PRILOSEC) 20 mg capsule Take 20 mg by mouth two (2) times a day.    Phys Other, MD   acetaminophen (TYLENOL) 500 mg tablet Take 500 mg by mouth every six (6) hours as needed for Pain.    Phys Other, MD         Review of Systems:    Constitutional: negative for fevers, chills, sweats, fatigue, malaise, anorexia and weight loss   Eyes: negative for irritation, redness and icterus   Ears, nose, mouth, throat, and face: negative for ear drainage, earaches, nasal congestion, sore mouth and sore throat   Respiratory: negative for cough, sputum, hemoptysis, pleurisy/chest pain, asthma, wheezing or dyspnea on exertion   Cardiovascular: as per hpi  Gastrointestinal: negative for nausea, vomiting, diarrhea, constipation and abdominal pain   Genitourinary:negative for frequency and dysuria   Hematologic/lymphatic: negative for easy bruising, bleeding, lymphadenopathy, petechiae and coughing up blood   Musculoskeletal:negative for myalgias, arthralgias and muscle weakness   Neurological: negative for headaches and dizziness   Endocrine: Denies heat or cold intolerance       Objective:      VITALS:    Vital signs reviewed; most recent are:    Visit Vitals   ??? BP 135/81   ??? Pulse 76   ??? Temp 97.8 ??F (36.6 ??C)   ??? Resp 20   ??? Ht 6\' 1"  (1.854 m)   ??? Wt 97.6 kg (215 lb 1.6 oz)   ??? SpO2  96%   ??? BMI 28.38 kg/m2     SpO2 Readings from Last 6 Encounters:   07/04/15 96%   12/27/14 98%   12/03/14 95%   08/05/14 90%   06/07/14 97%   06/01/14 92%             Intake/Output Summary (Last 24 hours) at 07/04/15 1513  Last data filed at 07/04/15 1145   Gross per 24 hour   Intake    120 ml   Output    550 ml   Net   -430 ml            Exam:     Physical Exam:    Gen:  Well-developed, well-nourished, in no acute distress  HEENT:  Pink conjunctivae, PERRL, hearing intact to voice, moist mucous membranes  Neck:  Supple, without masses, thyroid non-tender  Resp:  No accessory muscle use, clear breath sounds without wheezes rales or rhonchi  Card:  No murmurs, normal S1, S2 without thrills, bruits or peripheral edema  Abd:  Soft, non-tender, non-distended, normoactive bowel sounds are present, no palpable organomegaly  Lymph:  No cervical adenopathy  Musc:  No cyanosis or clubbing, left sided pin rolling tremors  Skin:  No rashes or ulcers, skin turgor is good  Neuro:  Cranial nerves 3-12 are grossly intact, grip strength is 5/5 bilaterally, dorsi / plantarflexion strength is 5/5 bilaterally, follows commands appropriately  Psych:  Alert with good insight.  Oriented to person, place, and time       Labs:    Recent Labs      07/03/15   2347   WBC  5.4   HGB  12.7   HCT  39.2   PLT  130*     Recent Labs      07/03/15   2347   NA  143   K  3.8   CL  108   CO2  31   GLU  96   BUN  19   CREA  1.00   CA  8.3*   ALB  2.9*   SGOT  17   ALT  16     No components found for: GLPOC  No results for input(s): PH, PCO2, PO2, HCO3, FIO2 in the last 72 hours.  No results for input(s): INR in the last 72 hours.    No lab exists for component: INREXT, INREXT    Chest Xray: IMPRESSION:    1. No free intraperitoneal gas.  2. Mild right basilar patchy and linear airspace disease.      EKG reviewed:  Normal sinus rhythm   Normal ECG     CT ABDOMEN/PELVIS:  FINDINGS:     There is mild elevation of the right hemidiaphragm. There is mild right basilar  patchy and linear airspace disease. There is no subdiaphragmatic free  intraperitoneal gas.      Abdomen: The gallbladder is surgically absent. The liver, spleen, adrenals,  pancreas are normal on noncontrast images. The kidneys are mildly atrophic.  There is a 3.6 cm right renal cyst. No renal, ureteral bladder calculus is  visualized. There is no perinephric stranding, hydronephrosis or hydroureter. No  thickened or dilated loop of large or small bowel seen. There is no free  intraperitoneal gas or fluid.     Pelvis: Urinary bladder is partially filled and grossly normal. Scattered  colonic diverticulosis is noted; there is no diverticulitis. There is no free  fluid in the pelvis. There is no focal  fluid collection to suggest abscess.     IMPRESSION  IMPRESSION:    1. No free intraperitoneal gas.  2. Mild right basilar patchy and linear airspace disease.             Assessment/Plan:      Chest pain r/o ACS POA( Hx S/P angioplasty with stent--2008 and 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 ) POA  -will admit, telemetry monitoring, cycle troponins  -getting Cardiology consult  -getting ECHO and stress test    Parkinson disease  HTN  HLP  GERD  Anxiety/depression   Hx TIA    Resume home meds      Surrogate decision maker:    Total time spent with patient: [address] [place] discussed with: Patient    Discussed:  Care Plan    Prophylaxis:  Lovenox    Probable Disposition:  Home w/Family           ___________________________________________________    Attending Physician: Ree Kida, MD

## 2015-07-04 NOTE — ED Notes (Cosign Needed)
Pattaint taken to CT via wheelchair. No acute distress noted.

## 2015-07-04 NOTE — Consults (Signed)
Consult    Patient: Dale Weaver MRN: [id number]  SSN: [id number]    Date of Birth: Dec 28, 1950  Age: 64 y.o.  Sex: male       Subjective:      Date of  Admission: 07/03/2015     Admission type: Emergency    This consultation was requested by Dr. Peggye Pitt, Hospitalist.    Dale Weaver is a 64 y.o. male [employer] veteran admitted for Chest Pain. Patient complains of chest pain, dyspnea.  Patient's symptoms last approximately 1 minute, and are intermittent episodes. He describes the symptoms as heavy, pressure-like, occurring with rest and activity. He reports radiation to left neck, shoulder and arm. The sx improved when he was given morphine and nitro SL; pt reports the percocet did not help at all. The sx are worsened by nothing. Pt reports feeling of dyspnea, which pt describes as "I can't take a full breath" which is worse supine and improved in reclined or upright position. Pain is not worsened or improved with position. Pt denies pleuritic component on my interview. Previous treatment/evaluation includes electrophysiology study and coronary angioplasty.     Pt receives bulk of his care and [facility] in [place], [place]. He reports angioplasty with stent placement 2008 followed by MI in 2012. Pt also notes an implanted loop recorder was placed for recurrent palpitations then removed in 2015. Pt is unable to give details as to rhythms or findings of loop recorder other than "they told me is wasn't enough to worry about" or to perform further work up after discontinuing the recorder. Pt has not seen cardiology since November 2015. Cardiac risk factors: dyslipidemia, male gender.    Pt reports daily LE swelling, with L>R. 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 This improves with elevation.     Pt takes atorvastatin and follows quarterly with PCP in the [facility]. He takes a daily ASA since developing what pt says are TIAs a few  years ago. He sees neurology through the [facility] at the [place]. In addition to Parkinson's, pt has a significant BH hx including Alcohol abuse (resolved x 3 years), PTSD, anxiety, depression and conversion d/o.    His last hospitalization was at [place] in October 2016 for a trip and fall. He denies head injury at that time.     Primary Care Provider: Phys Other, MD  Past Medical History   Diagnosis Date   ??? Back pain      chronic   ??? CAD (coronary artery disease)      MI 2008   ??? Heart failure (HCC)      CHF   ??? Hiatal hernia    ??? Parkinson's disease ([place])    ??? Psychiatric disorder      anxiety   ??? TIA (transient ischemic attack) 01/2014      Past Surgical History   Procedure Laterality Date   ??? Hx orthopaedic       back surgery x4, last June 2010   ??? Hx appendectomy     ??? Hx cholecystectomy     ??? Hx hernia repair     ??? Pr cardiac surg procedure unlist       cardiac cath August 2009   ??? Pr cardiac surg procedure unlist  2010     x 1 stent[facility]   ??? Pr cardiac surg procedure unlist       insertion of loop recorder removed 06/2014     Family  History   Problem Relation Age of Onset   ??? Stroke Father    ??? Hypertension Mother       Social History   Substance Use Topics   ??? Smoking status: Never Smoker   ??? Smokeless tobacco: Not on file   ??? Alcohol use No      Current Facility-Administered Medications   Medication Dose Route Frequency   ??? ARIPiprazole (ABILIFY) tablet 15 mg  15 mg Oral DAILY   ??? atorvastatin (LIPITOR) tablet 40 mg  40 mg Oral DAILY   ??? carbidopa-levodopa (SINEMET) 25-100 mg per tablet 1 Tab  1 Tab Oral TID   ??? cholecalciferol (VITAMIN D3) tablet 1,000 Units  1,000 Units Oral TID   ??? clopidogrel (PLAVIX) tablet 75 mg  75 mg Oral DAILY   ??? [START ON 07/05/2015] cyanocobalamin (VITAMIN B12) injection 1,000 mcg  1,000 mcg IntraMUSCular Q30D   ??? cyclobenzaprine (FLEXERIL) tablet 10 mg  10 mg Oral TID PRN   ??? divalproex ER (DEPAKOTE ER) 24 hour tablet 1,000 mg  1,000 mg Oral QHS    ??? FLUoxetine (PROzac) capsule 20 mg  20 mg Oral DAILY   ??? gabapentin (NEURONTIN) capsule 400 mg  400 mg Oral Q8H   ??? mirtazapine (REMERON) tablet 30 mg  30 mg Oral QHS   ??? pantoprazole (PROTONIX) tablet 40 mg  40 mg Oral BID   ??? sodium chloride (NS) flush 5-10 mL  5-10 mL IntraVENous Q8H   ??? sodium chloride (NS) flush 5-10 mL  5-10 mL IntraVENous PRN   ??? nitroglycerin (NITROSTAT) tablet 0.4 mg  0.4 mg SubLINGual Q5MIN PRN   ??? naloxone (NARCAN) injection 0.4 mg  0.4 mg IntraVENous PRN   ??? enoxaparin (LOVENOX) injection 40 mg  40 mg SubCUTAneous DAILY   ??? acetaminophen (TYLENOL) tablet 650 mg  650 mg Oral Q4H PRN   ??? oxyCODONE-acetaminophen (PERCOCET) 5-325 mg per tablet 1 Tab  1 Tab Oral Q4H PRN   ??? morphine injection 2 mg  2 mg IntraVENous Q6H PRN   ??? ondansetron (ZOFRAN) injection 4 mg  4 mg IntraVENous Q4H PRN   ??? Tizanidine 2mg  +++ give 1/2 of 4mg  tab +++  4 mg Oral DAILY   ??? regadenoson (LEXISCAN) injection 0.4 mg  0.4 mg IntraVENous ONCE   ??? regadenoson (LEXISCAN) 0.4 mg/5 mL injection       ??? nitroglycerin (NITROBID) 2 % ointment 0.5 Inch  0.5 Inch Topical BID   ??? nitroglycerin (NITROSTAT) tablet 0.4 mg  0.4 mg SubLINGual Q5MIN PRN        Allergies   Allergen Reactions   ??? Seafood [Shellfish Containing Products] Hives     Allergy to fish only.  Can eat shrimp.  No allergy to IV contrast.   ??? Sulfa (Sulfonamide Antibiotics) Hives   ??? Zoloft [Sertraline] Itching        Review of Systems:  A comprehensive review of systems was negative except for that written in the History of Present Illness.       Subjective:     Visit Vitals   ??? BP 154/80 (BP 1 Location: Right arm, BP Patient Position: At rest;Sitting)   ??? Pulse 68   ??? Temp 97.8 ??F (36.6 ??C)   ??? Resp 18   ??? Ht 6\' 1"  (1.854 m)   ??? Wt 215 lb 1.6 oz (97.6 kg)   ??? SpO2 97%   ??? BMI 28.38 kg/m2        Physical Exam:  Physical  Exam   Constitutional: He is oriented to person, place, and time and well-developed, well-nourished, and in no distress.    Mask-like facies, left hand tremulous   HENT:   Head: Normocephalic and atraumatic.   Mouth/Throat: Oropharynx is clear and moist.   Eyes: Conjunctivae and EOM are normal. Pupils are equal, round, and reactive to light. Right eye exhibits no discharge. Left eye exhibits no discharge.   Neck: Normal range of motion. Neck supple. No JVD present. No tracheal deviation present.   Cardiovascular: Normal rate, regular rhythm, normal heart sounds and intact distal pulses.  Exam reveals no gallop and no friction rub.    No murmur heard.  Pulmonary/Chest: Effort normal and breath sounds normal. No respiratory distress. He has no wheezes. He has no rales. He exhibits no tenderness.   Musculoskeletal: Normal range of motion. He exhibits no edema or tenderness.   Neurological: He is alert and oriented to person, place, and time. He displays tremor (left hand). No cranial nerve deficit.   Skin: Skin is warm, dry and intact. No rash noted. He is not diaphoretic. No erythema.   Psychiatric: Mood, memory, affect and judgment normal.   Nursing note and vitals reviewed.      Cardiographics:  Telemetry: normal sinus rhythm  ECG: normal EKG, normal sinus rhythm  Echocardiogram: Not done  NM Stress: pending    Chest XR: Portable AP Upright view. Cardiac silhouette poorly visualized due to increased overlying lung markings/airspace disease.          Data Reviewed: All lab results for the last 24 hours reviewed.     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 Pt notes only relief is with vasodilators: SL nitro and morphine. Will trial on nitro paste and await final read on NM stress images.  Labs unremarkable; there has been no elevation of cardiac markers.      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 Claiborne Billings    Chest pain (s/p angioplasty with stent placement and old MI)   -- Nitrobid 2% paste; 1/2 inch   -- Continue telemetry   -- NM stress report pending    Dyslipidemia   -- Continue Lipitor    Dyspnea   -- Treat as anginal component for now (see above plan for chest pain)     Thank you for this consult! We will continue to follow pt.    Signed By: Lebron Conners, PA-C     July 04, 2015

## 2015-07-04 NOTE — ED Notes (Signed)
Still has chest pain

## 2015-07-04 NOTE — ED Notes (Signed)
Patient rates chest pain 9/10.

## 2015-07-04 NOTE — Progress Notes (Signed)
Attended Medical Surgery and Telemetry Interdisciplinary Rounds at [facility]  Chaplain Martha J. Pittenger B[facility]  Chaplain Paging Service [phone]

## 2015-07-04 NOTE — ED Notes (Signed)
Patient rates chest pain 8/10.

## 2015-07-04 NOTE — Progress Notes (Signed)
Patient arrived to unit via stretcher,on telemetry and walked with stand-by assist to unit bed. Initiated cardiac telemetry, vitals signs obtained and assessment started. Patient oriented to unit room.

## 2015-07-04 NOTE — ED Notes (Signed)
TRANSFER - OUT REPORT:    Verbal report given to I. Jimmye Norman RN (name) on Dale Weaver  being transferred to telemetry (unit) for routine progression of care       Report consisted of patient???s Situation, Background, Assessment and   Recommendations(SBAR).     Information from the following report(s) SBAR, ED Summary, MAR, Recent Results and Cardiac Rhythm sinus rhythm was reviewed with the receiving nurse.    Lines:   Peripheral IV 07/04/15 Left Antecubital (Active)   Site Assessment Clean, dry, & intact 07/04/2015 12:47 AM   Phlebitis Assessment 0 07/04/2015 12:47 AM   Infiltration Assessment 0 07/04/2015 12:47 AM   Dressing Status Clean, dry, & intact 07/04/2015 12:47 AM   Dressing Type Transparent 07/04/2015 12:47 AM   Hub Color/Line Status Flushed 07/04/2015 12:47 AM        Opportunity for questions and clarification was provided.      Patient transported with:   Monitor  Registered Nurse

## 2015-07-04 NOTE — H&P (Signed)
History & Physical Dictation Template      NAME: Dale Weaver   Date of Service  07/04/2015   MRN  [id number]   Date of Birth [birth year]   AGE: 64 y.o.   ROOM: 213/01     @HACCTNO @    @HAREXTID @     @HARGUARSSN @     HPI  The patient Dale Weaver is a 64 y.o. male that is admitted with chest pain.  He has a pmh significant for cad s/p 1 cardiac stent in 2010, htn, hyperlipidemia.  He states that pain began at 10pm this evening at rest.  Chest pain is located on the left side with radiation to the left arm.  Pain is described as pressure like and rated 10/10.  He had associated symtpoms of nausea without vomiting, diaphoresis, and sob.  He took a baby aspirin at home and called the ambulance.  He received fentanyl and nitro in the er along with morphine on the floor that brought his pain level to 7/10.  His cardiologist is at the [place].  THE PATIENT WAS SEEN VIA REMOTE PRESENCE WITH RN PRESENT.    ASSESSMENT/ PLAN   1. Chest pain: cardiac enzymes, cardiology consult, may need to hold off on stress test if patient still having chest pain, on telemetry, cont aspirin, plavix, morphine, bb  2. Hyperlipidemia: cont homem meds  3. 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  Visit Vitals   ??? BP 112/79 (BP 1 Location: Right arm, BP Patient Position: Sitting)   ??? Pulse 62   ??? Temp 98.2 ??F (36.8 ??C)   ??? Resp 14   ??? Ht 6' 1"  (1.854 m)   ??? Wt 97.6 kg (215 lb 1.6 oz)   ??? SpO2 97%   ??? BMI 28.38 kg/m2       Pertinent Physical Exam Findings: lungs clear, heart sounds regular, in nad, alert and oriented x 3 no pitting edema  ________________________________________________________________________   family history includes Hypertension in his mother; Stroke in his father.  ________________________________________________________________________  Present on Admission:  ??? Chest pain, pleuritic  ??? Dyslipidemia  ??? S/P angioplasty with stent--2008  ??? 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    ________________________________________________________________________  ________________________________________________________________________  Recent Results (from the past 24 hour(s))   EKG, 12 LEAD, INITIAL    Collection Time: 07/03/15 11:29 PM   Result Value Ref Range    Ventricular Rate 61 BPM    Atrial Rate 61 BPM    P-R Interval 180 ms    QRS Duration 92 ms    Q-T Interval 436 ms    QTC Calculation (Bezet) 438 ms    Calculated P Axis 42 degrees    Calculated R Axis 27 degrees    Calculated T Axis 47 degrees    Diagnosis       Normal sinus rhythm  Normal ECG  When compared with ECG of 26-Dec-2014 20:09,  No significant change was found     TROPONIN I    Collection Time: 07/03/15 11:47 PM   Result Value Ref Range  Troponin-I, Qt. <0.04 <9.60 ng/mL   METABOLIC PANEL, COMPREHENSIVE    Collection Time: 07/03/15 11:47 PM   Result Value Ref Range    Sodium 143 136 - 145 mmol/L    Potassium 3.8 3.5 - 5.1 mmol/L    Chloride 108 97 - 108 mmol/L    CO2 31 21 - 32 mmol/L    Anion gap 4 (L) 5 - 15 mmol/L    Glucose 96 65 - 100 mg/dL    BUN 19 6 - 20 MG/DL    Creatinine 1.00 0.70 - 1.30 MG/DL    BUN/Creatinine ratio 19 12 - 20      GFR est AA >60 >60 ml/min/1.79m    GFR est non-AA >60 >60 ml/min/1.751m   Calcium 8.3 (L) 8.5 - 10.1 MG/DL    Bilirubin, total 0.4 0.2 - 1.0 MG/DL    ALT 16 12 - 78 U/L    AST 17 15 - 37 U/L    Alk. phosphatase 101 45 - 117 U/L    Protein, total 5.8 (L) 6.4 - 8.2 g/dL    Albumin 2.9 (L) 3.5 - 5.0 g/dL    Globulin 2.9 2.0 - 4.0 g/dL    A-G Ratio 1.0 (L) 1.1 - 2.2     CBC WITH AUTOMATED DIFF    Collection Time: 07/03/15 11:47 PM   Result Value Ref Range    WBC 5.4 4.1 - 11.1 K/uL     RBC 4.14 4.10 - 5.70 M/uL    HGB 12.7 12.1 - 17.0 g/dL    HCT 39.2 36.6 - 50.3 %    MCV 94.7 80.0 - 99.0 FL    MCH 30.7 26.0 - 34.0 PG    MCHC 32.4 30.0 - 36.5 g/dL    RDW 13.3 11.5 - 14.5 %    PLATELET 130 (L) 150 - 400 K/uL    NEUTROPHILS 72 32 - 75 %    LYMPHOCYTES 20 12 - 49 %    MONOCYTES 6 5 - 13 %    EOSINOPHILS 2 0 - 7 %    BASOPHILS 0 0 - 1 %    ABS. NEUTROPHILS 3.9 1.8 - 8.0 K/UL    ABS. LYMPHOCYTES 1.1 0.8 - 3.5 K/UL    ABS. MONOCYTES 0.3 0.0 - 1.0 K/UL    ABS. EOSINOPHILS 0.1 0.0 - 0.4 K/UL    ABS. BASOPHILS 0.0 0.0 - 0.1 K/UL   CK W/ REFLX CKMB    Collection Time: 07/03/15 11:47 PM   Result Value Ref Range    CK 55 39 - 308 U/L   PRO-BNP    Collection Time: 07/03/15 11:47 PM   Result Value Ref Range    NT pro-BNP 145 (H) 0 - 125 PG/ML   POC TROPONIN-I    Collection Time: 07/03/15 11:47 PM   Result Value Ref Range    Troponin-I (POC) <0.04 0.00 - 0.08 ng/mL     ________________________________________________________________________  Medications reviewed  Current Facility-Administered Medications   Medication Dose Route Frequency   ??? ARIPiprazole (ABILIFY) tablet 15 mg  15 mg Oral DAILY   ??? atorvastatin (LIPITOR) tablet 40 mg  40 mg Oral DAILY   ??? carbidopa-levodopa (SINEMET) 25-100 mg per tablet 1 Tab  1 Tab Oral TID   ??? cholecalciferol (VITAMIN D3) tablet 1,000 Units  1,000 Units Oral TID   ??? clopidogrel (PLAVIX) tablet 75 mg  75 mg Oral DAILY   ??? [START ON 07/05/2015] cyanocobalamin (VITAMIN B12) injection 1,000 mcg  1,000 mcg IntraMUSCular Q30D   ??? cyclobenzaprine (FLEXERIL) tablet 10 mg  10 mg Oral TID PRN   ??? divalproex ER (DEPAKOTE ER) 24 hour tablet 1,000 mg  1,000 mg Oral QHS   ??? FLUoxetine (PROzac) capsule 20 mg  20 mg Oral DAILY   ??? gabapentin (NEURONTIN) capsule 400 mg  400 mg Oral Q8H   ??? mirtazapine (REMERON) tablet 30 mg  30 mg Oral QHS   ??? pantoprazole (PROTONIX) tablet 40 mg  40 mg Oral BID   ??? tiZANidine (ZANAFLEX) tablet 2 mg  2 mg Oral DAILY    ??? sodium chloride (NS) flush 5-10 mL  5-10 mL IntraVENous Q8H   ??? sodium chloride (NS) flush 5-10 mL  5-10 mL IntraVENous PRN   ??? nitroglycerin (NITROSTAT) tablet 0.4 mg  0.4 mg SubLINGual Q5MIN PRN   ??? naloxone (NARCAN) injection 0.4 mg  0.4 mg IntraVENous PRN   ??? enoxaparin (LOVENOX) injection 40 mg  40 mg SubCUTAneous DAILY   ??? acetaminophen (TYLENOL) tablet 650 mg  650 mg Oral Q4H PRN   ??? oxyCODONE-acetaminophen (PERCOCET) 5-325 mg per tablet 1 Tab  1 Tab Oral Q4H PRN   ??? morphine injection 2 mg  2 mg IntraVENous Q6H PRN   ??? ondansetron (ZOFRAN) injection 4 mg  4 mg IntraVENous Q4H PRN   ??? nitroglycerin (NITROSTAT) tablet 0.4 mg  0.4 mg SubLINGual Q5MIN PRN       Windle Guard, MD  07/04/2015  4:22 AM

## 2015-07-04 NOTE — Progress Notes (Signed)
Care Management Interventions  RRAT Score: 13  Initial Assessment: CM reviewed chart and met with patient for discharge planning. CM verified patient???s address and contact number as correct on the facesheet. Pt presented to ED with c/o left sided chest pain .  Patient is currently living with friends.   Patient is retired.  PCP Dr. Manuella Ghazi.   Patient has [employer] . Patient  will not need assistance with obtaining medications. Patient consented for CM to make appointment arrangements.  Emergency Contact: Zadie Cleverly [phone]    PCP Verified by CM: Yes  Palliative Care Consult (Criteria: CHF and RRAT>21):   yes /no  Reason No Palliative Consult Ordered   Mode of Transport at Discharge: cab  Transition of Care Consult (CM Consult):  Evaluate for [facility] or [facility], community care coordination of resources. [facility] referral sent.   MyChart Signup:  Nurse assigned will address MyChart.  Discharge Durable Medical Equipment: none  Physical Therapy Consult: no  Occupational Therapy Consult: no  Speech Therapy Consult: no  Current Support Network: friends  Confirm Follow Up Transport: self/friends  Plan discussed with Pt/Family/Caregiver: yes  PCP/Specialist appointment within 7 - 10 days made prior to discharge. Yes, Dr. Manuella Ghazi on Dec. 12th at 2:00 pm.  Discharge Location- Home  Discharge Placement: Home with outpatient services.   Handoff to [facility] Nurse Navigator or PCP practice.  JMoon RN CM

## 2015-07-04 NOTE — Progress Notes (Signed)
Lexiscan stress test completed. Tolerated procedure well. C/o chest heaviness and mild headache, all normal side effects and resolved after test was completed. Provided snack and took to NM for final images.

## 2015-07-04 NOTE — ED Notes (Signed)
Verbal order not to give the patient another dose of nitroglycerin received from Dr. Nedra Hai with read back.

## 2015-07-04 NOTE — Progress Notes (Signed)
TRANSFER - IN REPORT:    Verbal report received from Brittney Blowe,RN(name) on Dale Weaver  being received from ER(unit) for routine progression of care      Report consisted of patient???s Situation, Background, Assessment and   Recommendations(SBAR).     Information from the following report(s) SBAR, Kardex, ED Summary, Intake/Output, MAR and Recent Results was reviewed with the receiving nurse.    Opportunity for questions and clarification was provided.

## 2015-07-05 LAB — TROPONIN I: Troponin-I, Qt.: <0.04 ng/mL (ref <0.05)

## 2015-07-05 MED FILL — BD POSIFLUSH NORMAL SALINE 0.9 % INJECTION SYRINGE: INTRAMUSCULAR | Qty: 10

## 2015-07-05 MED FILL — MORPHINE 10 MG/ML SYRINGE: 10 mg/mL | INTRAMUSCULAR | Qty: 1

## 2015-07-05 MED FILL — CARBIDOPA-LEVODOPA 25 MG-100 MG TAB: 25-100 mg | ORAL | Qty: 1

## 2015-07-05 MED FILL — PROTONIX 40 MG TABLET,DELAYED RELEASE: 40 mg | ORAL | Qty: 1

## 2015-07-05 MED FILL — FLUOXETINE 20 MG CAP: 20 mg | ORAL | Qty: 1

## 2015-07-05 MED FILL — GABAPENTIN 100 MG CAP: 100 mg | ORAL | Qty: 1

## 2015-07-05 MED FILL — ATORVASTATIN 40 MG TAB: 40 mg | ORAL | Qty: 1

## 2015-07-05 MED FILL — VITAMIN D3 25 MCG (1,000 UNIT) TABLET: 25 mcg (1,000 unit) | ORAL | Qty: 1

## 2015-07-05 MED FILL — DEPAKOTE ER 500 MG TABLET,EXTENDED RELEASE: 500 mg | ORAL | Qty: 2

## 2015-07-05 MED FILL — MIRTAZAPINE 15 MG TAB: 15 mg | ORAL | Qty: 2

## 2015-07-05 MED FILL — OXYCODONE-ACETAMINOPHEN 5 MG-325 MG TAB: 5-325 mg | ORAL | Qty: 1

## 2015-07-05 MED FILL — TIZANIDINE 4 MG TAB: 4 mg | ORAL | Qty: 1

## 2015-07-05 MED FILL — KETOROLAC TROMETHAMINE 30 MG/ML INJECTION: 30 mg/mL (1 mL) | INTRAMUSCULAR | Qty: 1

## 2015-07-05 MED FILL — ABILIFY 5 MG TABLET: 5 mg | ORAL | Qty: 3

## 2015-07-05 MED FILL — ENOXAPARIN 40 MG/0.4 ML SUB-Q SYRINGE: 40 mg/0.4 mL | SUBCUTANEOUS | Qty: 0.4

## 2015-07-05 MED FILL — CLOPIDOGREL 75 MG TAB: 75 mg | ORAL | Qty: 1

## 2015-07-05 MED FILL — CYANOCOBALAMIN 1,000 MCG/ML IJ SOLN: 1000 mcg/mL | INTRAMUSCULAR | Qty: 1

## 2015-07-05 NOTE — Progress Notes (Addendum)
0710 hrs .Marland KitchenBedside and Verbal shift change report given to [employer] developer) by Drema Pry (offgoing nurse). Report included the following information SBAR, Kardex, Intake/Output, MAR, Recent Results, Med Rec Status and Cardiac Rhythm nsr.    0900hrs Patient stated that his chest pain is improving as it is not radiating anymore to his left arm.    1300hrs Discharge instruction was discussed with patient, opportunity for questions and clarifications were provided. IV access was removed.    1345hrs Discharge patient to home, picked up by his friend. He was alert and oriented, not on any distress.

## 2015-07-05 NOTE — Progress Notes (Signed)
Cardiology Progress Note      07/05/2015 8:19 AM    Admit Date: 07/03/2015    Admit Diagnosis: Chest pain, pleuritic      Subjective:     Dale Weaver reports intermittent chest pain that is 5/10 and worse with deep breathing.     Visit Vitals   ??? BP 139/88 (BP 1 Location: Right arm, BP Patient Position: At rest;Sitting)   ??? Pulse 68   ??? Temp 97.5 ??F (36.4 ??C)   ??? Resp 18   ??? Ht 6' 1"  (1.854 m)   ??? Wt 215 lb 1.6 oz (97.6 kg)   ??? SpO2 91%   ??? BMI 28.38 kg/m2     Current Facility-Administered Medications   Medication Dose Route Frequency   ??? ARIPiprazole (ABILIFY) tablet 15 mg  15 mg Oral DAILY   ??? atorvastatin (LIPITOR) tablet 40 mg  40 mg Oral DAILY   ??? carbidopa-levodopa (SINEMET) 25-100 mg per tablet 1 Tab  1 Tab Oral TID   ??? cholecalciferol (VITAMIN D3) tablet 1,000 Units  1,000 Units Oral TID   ??? clopidogrel (PLAVIX) tablet 75 mg  75 mg Oral DAILY   ??? cyanocobalamin (VITAMIN B12) injection 1,000 mcg  1,000 mcg IntraMUSCular Q30D   ??? cyclobenzaprine (FLEXERIL) tablet 10 mg  10 mg Oral TID PRN   ??? divalproex ER (DEPAKOTE ER) 24 hour tablet 1,000 mg  1,000 mg Oral QHS   ??? FLUoxetine (PROzac) capsule 20 mg  20 mg Oral DAILY   ??? gabapentin (NEURONTIN) capsule 400 mg  400 mg Oral Q8H   ??? mirtazapine (REMERON) tablet 30 mg  30 mg Oral QHS   ??? pantoprazole (PROTONIX) tablet 40 mg  40 mg Oral BID   ??? sodium chloride (NS) flush 5-10 mL  5-10 mL IntraVENous Q8H   ??? sodium chloride (NS) flush 5-10 mL  5-10 mL IntraVENous PRN   ??? nitroglycerin (NITROSTAT) tablet 0.4 mg  0.4 mg SubLINGual Q5MIN PRN   ??? naloxone (NARCAN) injection 0.4 mg  0.4 mg IntraVENous PRN   ??? enoxaparin (LOVENOX) injection 40 mg  40 mg SubCUTAneous DAILY   ??? acetaminophen (TYLENOL) tablet 650 mg  650 mg Oral Q4H PRN   ??? oxyCODONE-acetaminophen (PERCOCET) 5-325 mg per tablet 1 Tab  1 Tab Oral Q4H PRN   ??? morphine injection 2 mg  2 mg IntraVENous Q6H PRN   ??? ondansetron (ZOFRAN) injection 4 mg  4 mg IntraVENous Q4H PRN    ??? Tizanidine 16m +++ give 1/2 of 433mtab +++  4 mg Oral DAILY   ??? nitroglycerin (NITROBID) 2 % ointment 0.5 Inch  0.5 Inch Topical BID   ??? ketorolac (TORADOL) injection 15 mg  15 mg IntraVENous Q12H PRN   ??? nitroglycerin (NITROSTAT) tablet 0.4 mg  0.4 mg SubLINGual Q5MIN PRN       Review of Systems - Negative except as noted in HPI.      Objective:      Physical Exam:  Neck: supple, symmetrical, trachea midline, no adenopathy, thyroid: not enlarged, symmetric, no tenderness/mass/nodules, no carotid bruit and no JVD  Heart: regular rate and rhythm, S1, S2 normal, no murmur, click, rub or gallop  Lungs: clear to auscultation bilaterally  Extremities: extremities normal, atraumatic, no cyanosis or edema  Pulses: 2+ and symmetric  Neurologic: Grossly normal  GEN: WDWN adult male, NAD. Mask-like facies. Left hand tremor.    Data Review:  All labs reviewed.   Labs:    Recent Results (  from the past 24 hour(s))   STRESS TEST CARDIAC    Collection Time: 07/04/15 11:57 AM   Result Value Ref Range    Diagnosis       Pharmacologic protocol.  Pt was given 0.6m Lexiscan during seated leg exercise. Pt was then given Tc   99   Cardiolite. Pt experienced brief dyspnea at peak drug effect. Pt noted   intermittent chest pain throughout the test, which was same as chest pain   before the test. There were no arrhythmias or ST changes.   Impression: Pharmacologic protocol. Please see [facility] imaging report.  KLebron Conners PA-C  MDerwood Kaplan MD    Confirmed by KClaiborne BillingsMD, MLegrand Como([id number], editor SLum Keas([phone] on   07/04/2015 1:04:18 PM      Test indication Chest Pain     Functional capacity na     ECG Interp. Before Exercise Normal     ECG Interp. During Exercise none     Overall HR response to exercise na     Overall BP response to exercise NA     Max. Systolic BP 1702mmHg    Max. Diastolic BP 81 mmHg    Max. Heart rate 106 BPM    Duke treadmill score      Duke TM score result      Peak Ex METs 1.0 METS     Protocol name LEXISCAN             Known cardiac condition      Attending physician MRayford Halsted MD    SED RATE (ESR)    Collection Time: 07/04/15  4:47 PM   Result Value Ref Range    Sed rate, automated 11 0 - 20 mm/hr   TROPONIN I    Collection Time: 07/05/15 12:25 AM   Result Value Ref Range    Troponin-I, Qt. 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 This is likely related to fluid in fissures seen on CT and does not appear to be cardiac in origin. NSAIDs should be considered for pain control.     Plan:   Discussed with Dr. KClaiborne Billings   Chest pain (s/p angioplasty with stent, Old MI)   -- Most likely pleurisy/pleuritis.    -- Continue NSAIDs for pain control   -- Discontinue nitrobid 2% paste    Dyslipidemia   -- Continue lipitor    We will continue to follow.  KLebron Conners PA-C

## 2015-07-05 NOTE — Progress Notes (Signed)
Care Management Discharge Note   RRAT: 11  PCP Verified by CM  Yes   Transition of Care Consult (CM Consult)  Discharge Planning    Physical Therapy Consult   no   Occupational Therapy Consult  no   Speech Therapy Consult  no   Discharge Durable Medical Equipment  no  DME company / equipment : no  Current Engineer, site  Lives with friends   Confirm Follow -Up Transport  -self/ friends  Plan discussed with Pt/Family/Caregiver   yes   Discharge Placement -Home  [facility] will contact patient regarding set-up visits.   CM scheduled follow-up appointment with  [facility] 08/01/15 at 2:00 pm.    Dr. Claiborne Billings on 07/07/15 at 9:15 am.     JMoon RN CM

## 2015-07-05 NOTE — Other (Cosign Needed)
CM reviewed pt's notes and IDR team rounded with pt and discussed discharge planning and next steps of care.    Mable Fill, Care Management Intern  Veneda Melter, MSW

## 2015-07-05 NOTE — Discharge Summary (Signed)
[place]    Physician Discharge Summary     Patient ID:  Dale Weaver  [id number]  64 y.o.  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 Duggal is a 64 y.o. Caucasian male with a past medical history of CAD status post PCI/stent 2009 and MI in 2012, hypertension, hyperlipidemia, Parkinson's disease who comes to the emergency department via ambulance with chest pain. As per patient, around 10:00 PM last evening he started having acute onset chest pain 10/10 severe in intensity, pressure-like in nature while resting. The pain was also associated with nausea and diaphoresis. The patient has a pretty prominent cardiac history in the past and is normally followed by a cardiologist at [facility] in [place]. He also states that he had an implanted loop recorder for frequent palpitations but that was removed last year in November. ??  ????The patient states that his chest pain is not aggravated by any pain but alleviated by baby aspirin that he took at home . He also received 3 more baby aspirin and nitro in the emergency department along with morphine which helped alleviate his pain and brought it down to a level of 7/10.      Chest pain r/o ACS POA( Hx S/P angioplasty with stent--2008 and 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 ) POA  STRESS TEST RESULTS   ??Pt was given 0.4mg  Lexiscan during seated leg exercise. Pt was then given Tc 99   ??Cardiolite. Pt experienced brief dyspnea at peak drug effect. Pt noted   ??intermittent chest pain throughout the test, which was same as chest pain   ??before the test. There were no arrhythmias or ST changes.     This patient has fluid in each of his major and minor pleural fissures and may have some form of pleurisy. The pain is not based on ischemic heart disease    Parkinson disease  HTN  HLP  GERD  Anxiety/depression  Hx TIA  ??  Resume home meds    PCP: Phys Other, MD     Consults: Cardiology    Discharge Exam:  Gen:  No acute distress  Resp:  No accessory muscle use, clear breath sounds without wheezes rales or rhonchi  Card:  No murmurs, normal S1, S2 without thrills, bruits or peripheral edema,reproducible pain on left chest+  Abd:  Soft, non-tender, non-distended, normoactive bowel sounds are present, no palpable organomegaly  Skin:  No rashes or ulcers, skin turgor is good  Neuro:  Cranial nerves 3-12 are grossly intact, grip strength is 5/5 bilaterally, dorsi / plantarflexion strength is 5/5 bilaterally, follows commands appropriately    Discharge condition: Afrebrile  Ambulating  Eating, Drinking, Voiding  Stable    Disposition: home    Patient Instructions:   Diet: Cardiac Diet  Activity: Activity as tolerated      Wound Care: None needed   Care Plan discussed with: Patient/Family        Follow up   Follow-up Information     Follow up With Details Comments Contact Info    [facility]  [facility] will contact you to set up visits. [facility] On 08/01/2015 [facility] with Dr. Manuella Ghazi at 2:00 pm. first available appt. [place]    Derwood Kaplan, MD On 07/07/2015 your appointment is at 9:15 am.  [address]  [address]  [place] [address]  [phone]            Discharge Medication List as of 07/05/2015 11:53 AM      CONTINUE these medications which have NOT CHANGED    Details   oxyCODONE-acetaminophen (PERCOCET) 5-325 mg per tablet Take 1 Tab by mouth every four (4) hours as needed for Pain. Max Daily Amount: 6 Tabs., Print, Disp-20 Tab, R-0      cyclobenzaprine (FLEXERIL) 10 mg tablet Take 1 Tab by mouth three (3) times daily as needed for Muscle Spasm(s)., Print, Disp-20 Tab, R-0      clopidogrel (PLAVIX) 75 mg tablet Take 1 Tab by mouth daily., Print, Disp-30 Tab, R-0      ARIPiprazole (ABILIFY) 30 mg tablet Take 15 mg by mouth daily., Historical Med      carbidopa-levodopa (SINEMET) 25-100 mg per tablet Take 1 Tab by mouth three (3) times daily., Historical Med      FLUoxetine (PROZAC) 20 mg tablet Take 20 mg by mouth daily., Historical Med      gabapentin (NEURONTIN) 400 mg capsule Take 400 mg by mouth every eight (8) hours., Historical Med      divalproex ER (DEPAKOTE ER) 500 mg ER tablet Take 1,000 mg by mouth nightly., Historical Med      docusate sodium (COLACE) 100 mg capsule Take 100 mg by mouth two (2) times a day., Historical Med      CARBOXYMETHYLCELLULOS/GLYCERIN (LUBRICATING DROPS OP) Apply  to eye nightly., Historical Med      mirtazapine (REMERON) 30 mg tablet Take 30 mg by mouth nightly., Historical Med      omeprazole (PRILOSEC) 20 mg capsule Take 20 mg by mouth two (2) times a day., Historical Med      tiZANidine (ZANAFLEX) 2 mg tablet Take 2 mg by mouth every morning. 1 tab in AM and 2 tab bedtime, Historical Med      cyanocobalamin (VITAMIN B12) 1,000 mcg/mL injection 1,000 mcg by IntraMUSCular route every thirty (30) days., Historical Med      cholecalciferol (VITAMIN D3) 1,000 unit tablet Take 1,000 Units by mouth three (3) times daily., Historical Med      atorvastatin (LIPITOR) 80 mg tablet Take 40 mg by mouth daily., Historical Med      acetaminophen (TYLENOL) 500 mg tablet Take 500 mg by mouth every six (6)  hours as needed for Pain., Historical Med              Significant Diagnostic Studies:   Recent Labs      07/03/15   2347   WBC  5.4   HGB  12.7   HCT  39.2   PLT  130*     Recent Labs  07/03/15   2347   NA  143   K  3.8   CL  108   CO2  31   BUN  19   CREA  1.00   GLU  96   CA  8.3*     Recent Labs      07/03/15   2347   SGOT  17   ALT  16   AP  101   TBILI  0.4   TP  5.8*   ALB  2.9*   GLOB  2.9     No results for input(s): INR, PTP, APTT in the last 72 hours.    No lab exists for component: INREXT, INREXT   No results for input(s): FE, TIBC, PSAT, FERR in the last 72 hours.   No results for input(s): PH, PCO2, PO2 in the last 72 hours.  Recent Labs      07/04/15   0321   CPK  48   CKMB  1.9     Lab Results   Component Value Date/Time    GLUCOSE (POC) 101 12/01/2014 11:10 AM    GLUCOSE (POC) 106 12/01/2014 07:35 AM    GLUCOSE (POC) 157 01/21/2014 11:33 AM    GLUCOSE (POC) 108 01/20/2014 01:04 PM          Approximate time spent in patient care on day of discharge: 30 minutes    Signed:  Ree Kida, MD  07/06/2015  10:42 AM

## 2015-07-06 NOTE — Telephone Encounter (Signed)
CM contacted pt about follow up discharge instructions. CM was not able to leave a voicemail due to sharp ringing on the other end of the phone line.    Mable Fill, Care Management Intern  Jarvis Morgan, MSW

## 2015-07-07 ENCOUNTER — Encounter: Attending: Cardiovascular Disease | Primary: Family Medicine

## 2015-07-07 NOTE — Telephone Encounter (Signed)
Care manager called patient to follow up. Unable to reach patient, nonworking number. This was second attempt to reach the patient.    Dale Weaver, MSW  [phone]

## 2015-07-11 LAB — EKG, 12 LEAD, INITIAL
Atrial Rate: 61 {beats}/min
Calculated P Axis: 42 degrees
Calculated R Axis: 27 degrees
Calculated T Axis: 47 degrees
Diagnosis: NORMAL
P-R Interval: 180 ms
Q-T Interval: 436 ms
QRS Duration: 92 ms
QTC Calculation (Bezet): 438 ms
Ventricular Rate: 61 {beats}/min

## 2015-07-25 ENCOUNTER — Inpatient Hospital Stay
Admit: 2015-07-25 | Discharge: 2015-07-27 | Disposition: A | Discharge status: Home / Self Care | Payer: MEDICAID | Attending: Internal Medicine | Admitting: Internal Medicine

## 2015-07-25 ENCOUNTER — Emergency Department: Admit: 2015-07-25 | Payer: MEDICAID | Primary: Family Medicine

## 2015-07-25 ENCOUNTER — Emergency Department: Payer: MEDICAID | Primary: Family Medicine

## 2015-07-25 DIAGNOSIS — G459 Transient cerebral ischemic attack, unspecified: Principal | ICD-10-CM

## 2015-07-25 LAB — CBC WITH AUTOMATED DIFF
ABS. BASOPHILS: 0 10*3/uL (ref 0.0–0.1)
ABS. EOSINOPHILS: 0 10*3/uL (ref 0.0–0.4)
ABS. LYMPHOCYTES: 0.8 10*3/uL (ref 0.8–3.5)
ABS. MONOCYTES: 0.3 10*3/uL (ref 0.0–1.0)
ABS. NEUTROPHILS: 3.6 10*3/uL (ref 1.8–8.0)
BASOPHILS: 0 % (ref 0–1)
EOSINOPHILS: 1 % (ref 0–7)
HCT: 42.9 % (ref 36.6–50.3)
HGB: 14.5 g/dL (ref 12.1–17.0)
LYMPHOCYTES: 17 % (ref 12–49)
MCH: 30.9 PG (ref 26.0–34.0)
MCHC: 33.8 g/dL (ref 30.0–36.5)
MCV: 91.5 FL (ref 80.0–99.0)
MONOCYTES: 7 % (ref 5–13)
NEUTROPHILS: 75 % (ref 32–75)
PLATELET: 126 10*3/uL — ABNORMAL LOW (ref 150–400)
RBC: 4.69 M/uL (ref 4.10–5.70)
RDW: 13.7 % (ref 11.5–14.5)
WBC: 4.8 10*3/uL (ref 4.1–11.1)

## 2015-07-25 LAB — URINALYSIS W/ REFLEX CULTURE
Bacteria: NEGATIVE /hpf
Bilirubin: NEGATIVE
Glucose: NEGATIVE mg/dL
Ketone: NEGATIVE mg/dL
Nitrites: NEGATIVE
Protein: NEGATIVE mg/dL
Specific gravity: 1.015 (ref 1.003–1.030)
Urobilinogen: 1 EU/dL (ref 0.2–1.0)
pH (UA): 7 (ref 5.0–8.0)

## 2015-07-25 LAB — METABOLIC PANEL, COMPREHENSIVE
A-G Ratio: 1.3 (ref 1.1–2.2)
ALT (SGPT): 49 U/L (ref 12–78)
AST (SGOT): 70 U/L — ABNORMAL HIGH (ref 15–37)
Albumin: 3.8 g/dL (ref 3.5–5.0)
Alk. phosphatase: 136 U/L — ABNORMAL HIGH (ref 45–117)
Anion gap: 8 mmol/L (ref 5–15)
BUN/Creatinine ratio: 15 (ref 12–20)
BUN: 15 MG/DL (ref 6–20)
Bilirubin, total: 0.5 MG/DL (ref 0.2–1.0)
CO2: 29 mmol/L (ref 21–32)
Calcium: 8.3 MG/DL — ABNORMAL LOW (ref 8.5–10.1)
Chloride: 107 mmol/L (ref 97–108)
Creatinine: 0.98 MG/DL (ref 0.70–1.30)
GFR est AA: >60 mL/min/{1.73_m2} (ref >60)
GFR est non-AA: >60 mL/min/{1.73_m2} (ref >60)
Globulin: 3 g/dL (ref 2.0–4.0)
Glucose: 127 mg/dL — ABNORMAL HIGH (ref 65–100)
Potassium: 3.6 mmol/L (ref 3.5–5.1)
Protein, total: 6.8 g/dL (ref 6.4–8.2)
Sodium: 144 mmol/L (ref 136–145)

## 2015-07-25 LAB — DRUG SCREEN, URINE
AMPHETAMINES: NEGATIVE
BARBITURATES: NEGATIVE
BENZODIAZEPINES: NEGATIVE
COCAINE: NEGATIVE
METHADONE: NEGATIVE
OPIATES: NEGATIVE
PCP(PHENCYCLIDINE): NEGATIVE
THC (TH-CANNABINOL): NEGATIVE

## 2015-07-25 LAB — CK W/ CKMB & INDEX
CK - MB: 2.2 NG/ML (ref 0.5–3.6)
CK-MB Index: 1.9 (ref 0–2.5)
CK: 118 U/L (ref 39–308)

## 2015-07-25 LAB — PROTHROMBIN TIME + INR
INR: 1.1 (ref 0.9–1.1)
Prothrombin time: 11 s (ref 9.0–11.1)

## 2015-07-25 LAB — GLUCOSE, POC: Glucose (POC): 140 mg/dL — ABNORMAL HIGH (ref 65–100)

## 2015-07-25 LAB — POC TROPONIN-I: Troponin-I (POC): <0.04 ng/mL (ref 0.00–0.08)

## 2015-07-25 MED ORDER — ADV ADDAPTOR
1 gram | Status: AC
Start: 2015-07-25 — End: 2015-07-25
  Administered 2015-07-25: 23:00:00 via INTRAVENOUS

## 2015-07-25 MED ORDER — ASPIRIN 81 MG CHEWABLE TAB
81 mg | ORAL | Status: AC
Start: 2015-07-25 — End: 2015-07-25
  Administered 2015-07-25: 23:00:00 via ORAL

## 2015-07-25 MED FILL — CHILDREN'S ASPIRIN 81 MG CHEWABLE TABLET: 81 mg | ORAL | Qty: 4

## 2015-07-25 MED FILL — CEFTRIAXONE 1 GRAM SOLUTION FOR INJECTION: 1 gram | INTRAMUSCULAR | Qty: 1

## 2015-07-25 NOTE — ED Notes (Signed)
X-RAY &CT completed Pt passed dysphagia screening. Pt  Awaiting disposition

## 2015-07-25 NOTE — ED Provider Notes (Signed)
HPI Comments: Reports slurred speech x 10 minutes after  conflict at his facility while watching tv ??Pt told me his speech slurred (with a slur) and then spoke clearly. Caregiver at bedside noted intermittent slurred speech also, lasting seconds then speech at baseline  Pt with episode of slurred speech while in ED, resolved prior to my exam  Denies fever, chills, cp,sob, trauma        Patient is a 64 y.o. male presenting with anxiety and headaches. The history is provided by the patient and a caregiver.   Anxiety    This is a chronic problem. Associated symptoms include back pain (h/o spasms, on robaxin) and headaches. Pertinent negatives include no abdominal pain, no cough, no dizziness, no fever, no nausea, no numbness, no palpitations, no shortness of breath, no vomiting and no weakness.   Headache    This is a new problem. The current episode started less than 1 hour ago. Associated with: slurred speech. The pain is at a severity of 6/10. Pertinent negatives include no fever, no palpitations, no shortness of breath, no weakness, no dizziness, no visual change, no nausea and no vomiting.        Past Medical History:   Diagnosis Date   ??? Back pain      chronic   ??? CAD (coronary artery disease)      MI 2008   ??? Heart failure (HCC)      CHF   ??? Hiatal hernia    ??? Parkinson's disease ([place])    ??? Psychiatric disorder      anxiety   ??? TIA (transient ischemic attack) 01/2014       Past Surgical History:   Procedure Laterality Date   ??? Hx orthopaedic       back surgery x4, last June 2010   ??? Hx appendectomy     ??? Hx cholecystectomy     ??? Hx hernia repair     ??? Pr cardiac surg procedure unlist       cardiac cath August 2009   ??? Pr cardiac surg procedure unlist  2010     x 1 stent[facility]   ??? Pr cardiac surg procedure unlist       insertion of loop recorder removed 06/2014         Family History:   Problem Relation Age of Onset   ??? Stroke Father    ??? Hypertension Mother        Social History     Social History    ??? Marital status: WIDOWED     Spouse name: N/A   ??? Number of children: N/A   ??? Years of education: N/A     Occupational History   ??? Not on file.     Social History Main Topics   ??? Smoking status: Never Smoker   ??? Smokeless tobacco: Not on file   ??? Alcohol use No   ??? Drug use: No   ??? Sexual activity: Not on file     Other Topics Concern   ??? Not on file     Social History Narrative         ALLERGIES: Seafood [shellfish containing products]; Sulfa (sulfonamide antibiotics); and Zoloft [sertraline]    Review of Systems   Constitutional: Negative for chills and fever.   HENT: Negative for congestion and rhinorrhea.    Eyes: Negative for visual disturbance.   Respiratory: Negative for cough and shortness of breath.    Cardiovascular:  Negative for chest pain and palpitations.   Gastrointestinal: Negative for abdominal pain, diarrhea, nausea and vomiting.   Genitourinary: Negative for difficulty urinating and dysuria.   Musculoskeletal: Positive for arthralgias and back pain (h/o spasms, on robaxin).   Skin: Negative for rash.   Neurological: Positive for headaches. Negative for dizziness, syncope, weakness, light-headedness and numbness.       Vitals:    07/25/15 1554 07/25/15 1600 07/25/15 1644 07/25/15 1700   BP: 148/88 142/85 139/85 142/80   Pulse: 88   89   Resp: 14   21   Temp: 97.6 ??F (36.4 ??C)      SpO2: 100% 98%  94%   Weight: 97.5 kg (215 lb)      Height: 6\' 1"  (1.854 m)               Physical Exam   Constitutional: He is oriented to person, place, and time. He appears well-developed and well-nourished.   HENT:   Head: Atraumatic.   Left Ear: External ear normal.   Mouth/Throat: Oropharynx is clear and moist.   Eyes: Conjunctivae and EOM are normal. Pupils are equal, round, and reactive to light.   Neck: Normal range of motion. No JVD present.   Cardiovascular: Normal rate, regular rhythm, normal heart sounds and intact distal pulses.    Pulmonary/Chest: Effort normal and breath sounds normal.    Abdominal: Soft. There is no tenderness.   Lymphadenopathy:     He has no cervical adenopathy.   Neurological: He is alert and oriented to person, place, and time. He has normal strength. No cranial nerve deficit or sensory deficit. GCS eye subscore is 4. GCS verbal subscore is 5. GCS motor subscore is 6.   Skin: Skin is warm and dry.   Psychiatric: He has a normal mood and affect. His behavior is normal.   Nursing note and vitals reviewed.       MDM  Number of Diagnoses or Management Options  Diagnosis management comments: Cva, tia, metabolic derangement,        Amount and/or Complexity of Data Reviewed  Clinical lab tests: ordered and reviewed  Tests in the radiology section of CPT??: ordered and reviewed      ED Course       Procedures    Based on intermittent sx, and pt hx, pt admitted to [facility]  Case discussed with hosp md  Pt made aware of admission

## 2015-07-25 NOTE — Other (Signed)
TRANSFER - OUT REPORT:    Verbal report given to [facility] R.N. (name) on Dale Weaver  being transferred to [facility](unit) for routine progression of care       Report consisted of patient???s Situation, Background, Assessment and   Recommendations(SBAR).     Information from the following report(s) SBAR, Kardex and ED Summary was reviewed with the receiving nurse.    Lines:   Peripheral IV 07/25/15 Left Hand (Active)   Site Assessment Clean, dry, & intact 07/25/2015  4:28 PM   Phlebitis Assessment 0 07/25/2015  4:28 PM   Infiltration Assessment 0 07/25/2015  4:28 PM   Dressing Status Clean, dry, & intact 07/25/2015  4:28 PM   Dressing Type Transparent 07/25/2015  4:28 PM   Hub Color/Line Status Blue;Flushed 07/25/2015  4:28 PM   Action Taken Blood drawn 07/25/2015  4:28 PM        Opportunity for questions and clarification was provided.      Patient transported with:   Monitor  Registered Nurse

## 2015-07-25 NOTE — ED Notes (Signed)
Hospitalist at bedside evaluating pt. Report will be called to unit after she is finished as per her request.

## 2015-07-25 NOTE — H&P (Signed)
Dale Weaver  Admission History and Physical      NAME:  Dale Weaver   DOB:   1951-01-17   MRN:  [id number]     PCP:  Dale Jensen, MD     Date/Time:  07/25/2015      CHIEF COMPLAINT: slurred speech    HISTORY OF PRESENT ILLNESS:     Dale Weaver is a 64 y.o.  WHITE OR CAUCASIAN male with PMX of  TIA, CAD status post PCI/stent 2009 and MI in 2012, hypertension, hyperlipidemia, Parkinson's disease who presented to the Emergency Department today complaining of slurred speech.  Patient states that he had an episode of slurred speech today while he was watching TV which lasted for about 10 minutes. There were no associatedsymptoms of weakness, sensory abnormalities, lightheadedness or dizziness. Patient denies any loss of consciousness. The patient speech return to Baseline after the few seconds. Patient states that since morning he's been having intermittent episodes of slurred speech . As per ED physician and my examination the patient was at that baseline,no slurred speech could be appreciated. Patient denies fever, chills, cp, shortness of breath, palpitations. Denies any urinary tract symptoms.      Past Medical History   Diagnosis Date   ??? Back pain      chronic   ??? CAD (coronary artery disease)      MI 2008   ??? Heart failure (HCC)      CHF   ??? Hiatal hernia    ??? Parkinson's disease ([place])    ??? Psychiatric disorder      anxiety   ??? TIA (transient ischemic attack) 01/2014        Past Surgical History   Procedure Laterality Date   ??? Hx orthopaedic       back surgery x4, last June 2010   ??? Hx appendectomy     ??? Hx cholecystectomy     ??? Hx hernia repair     ??? Pr cardiac surg procedure unlist       cardiac cath August 2009   ??? Pr cardiac surg procedure unlist  2010     x 1 [facility]   ??? Pr cardiac surg procedure unlist       insertion of loop recorder removed 06/2014       Social History   Substance Use Topics   ??? Smoking status: Never Smoker   ??? Smokeless tobacco: Not on file    ??? Alcohol use No        Family History   Problem Relation Age of Onset   ??? Stroke Father    ??? Hypertension Mother         Allergies   Allergen Reactions   ??? Seafood [Shellfish Containing Products] Hives     Allergy to fish only.  Can eat shrimp.  No allergy to IV contrast.   ??? Sulfa (Sulfonamide Antibiotics) Hives   ??? Zoloft [Sertraline] Itching        Prior to Admission medications    Medication Sig Start Date End Date Taking? Authorizing Provider   oxyCODONE-acetaminophen (PERCOCET) 5-325 mg per tablet Take 1 Tab by mouth every four (4) hours as needed for Pain. Max Daily Amount: 6 Tabs. 12/26/14   Antonieta Pert, DO   ARIPiprazole (ABILIFY) 30 mg tablet Take 15 mg by mouth daily.    Phys Other, MD   carbidopa-levodopa (SINEMET) 25-100 mg per tablet Take 1 Tab by mouth three (  3) times daily.    Phys Other, MD   FLUoxetine (PROZAC) 20 mg tablet Take 20 mg by mouth daily.    Phys Other, MD   gabapentin (NEURONTIN) 400 mg capsule Take 400 mg by mouth every eight (8) hours.    Phys Other, MD   divalproex ER (DEPAKOTE ER) 500 mg ER tablet Take 1,000 mg by mouth nightly.    Phys Other, MD   docusate sodium (COLACE) 100 mg capsule Take 100 mg by mouth two (2) times a day.    Phys Other, MD   mirtazapine (REMERON) 30 mg tablet Take 30 mg by mouth nightly.    Phys Other, MD   omeprazole (PRILOSEC) 20 mg capsule Take 20 mg by mouth two (2) times a day.    Phys Other, MD   cyanocobalamin (VITAMIN B12) 1,000 mcg/mL injection 1,000 mcg by IntraMUSCular route every thirty (30) days.    Phys Other, MD   cholecalciferol (VITAMIN D3) 1,000 unit tablet Take 1,000 Units by mouth three (3) times daily.    Phys Other, MD   atorvastatin (LIPITOR) 80 mg tablet Take 40 mg by mouth daily.    Phys Other, MD         Review of Systems:    Constitutional: negative for fevers, chills, sweats, fatigue, malaise, anorexia and weight loss   Eyes: negative for irritation, redness and icterus    Ears, nose, mouth, throat, and face: negative for ear drainage, earaches, nasal congestion, sore mouth and sore throat   Respiratory: negative for cough, sputum, hemoptysis, pleurisy/chest pain, asthma, wheezing or dyspnea on exertion   Cardiovascular: negative for dyspnea, palpitations, irregular heart beats, near-syncope, syncope, orthopnea, paroxysmal nocturnal dyspnea, lower extremity edema, tachypnea   Gastrointestinal: negative for nausea, vomiting, diarrhea, constipation and abdominal pain   Genitourinary:negative for frequency and dysuria   Hematologic/lymphatic: negative for easy bruising, bleeding, lymphadenopathy, petechiae and coughing up blood   Musculoskeletal:negative for myalgias, arthralgias and muscle weakness   Neurological:as per hpi  Endocrine: Denies heat or cold intolerance       Objective:      VITALS:    Vital signs reviewed; most recent are:    Visit Vitals   ??? BP 137/90 (BP 1 Location: Left arm, BP Patient Position: Sitting)   ??? Pulse 77   ??? Temp 97.9 ??F (36.6 ??C)   ??? Resp 16   ??? Ht 6\' 1"  (1.854 m)   ??? Wt 97.5 kg (215 lb)   ??? SpO2 95%   ??? BMI 28.37 kg/m2     SpO2 Readings from Last 6 Encounters:   07/25/15 95%   07/05/15 91%   12/27/14 98%   12/03/14 95%   08/05/14 90%   06/07/14 97%          Intake/Output Summary (Last 24 hours) at 07/25/15 1953  Last data filed at 07/25/15 1946   Gross per 24 hour   Intake     60 ml   Output      0 ml   Net     60 ml            Exam:     Physical Exam:    Gen:  Well-developed, well-nourished, in no acute distress  HEENT:  Pink conjunctivae, PERRL, hearing intact to voice, moist mucous membranes  Neck:  Supple, without masses, thyroid non-tender  Resp:  No accessory muscle use, clear breath sounds without wheezes rales or rhonchi  Card:  No murmurs, normal S1, S2  without thrills, bruits or peripheral edema  Abd:  Soft, non-tender, non-distended, normoactive bowel sounds are present, no palpable organomegaly  Lymph:  No cervical adenopathy   Musc:  No cyanosis or clubbing  Skin:  No rashes or ulcers, skin turgor is good  Neuro:  Cranial nerves 3-12 are grossly intact, grip strength is 5/5 bilaterally, dorsi / plantarflexion strength is 5/5 bilaterally, follows commands appropriately  Psych:  Alert with good insight.  Oriented to person, place, and time       Labs:    Recent Labs      07/25/15   1627   WBC  4.8   HGB  14.5   HCT  42.9   PLT  126*     Recent Labs      07/25/15   1627   NA  144   K  3.6   CL  107   CO2  29   GLU  127*   BUN  15   CREA  0.98   CA  8.3*   ALB  3.8   SGOT  70*   ALT  49     No components found for: GLPOC  No results for input(s): PH, PCO2, PO2, HCO3, FIO2 in the last 72 hours.  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

## 2015-07-25 NOTE — ED Notes (Signed)
According to home health companion Pt had an episode of slurred speech x2. Pt has hx of anxiety and PTSD. Pt speaking in complete sentences and able to explain his issues.          Emergency Department Nursing Plan of Care       The Nursing Plan of Care is developed from the Nursing assessment and Emergency Department Attending provider initial evaluation.  The plan of care may be reviewed in the ???ED Provider note???.    The Plan of Care was developed with the following considerations:   Patient / Family readiness to learn indicated [id number] understanding  Persons(s) to be included in education: patient and family  Barriers to Learning/Limitations:No    Signed     [name], RN    07/25/2015   4:02 PM

## 2015-07-25 NOTE — ED Notes (Signed)
Pt medicated as per provider orders Plan of care accepted by pt and pt is NPO

## 2015-07-25 NOTE — Progress Notes (Addendum)
TRANSFER - IN REPORT:    Verbal report received from Eddie Rn(name) on [employer]  being received from [place] rn(unit) for routine progression of care      Report consisted of patient???s Situation, Background, Assessment and   Recommendations(SBAR).     Information from the following report(s) SBAR, Kardex, ED Summary, Intake/Output, MAR, Recent Results, Med Rec Status and Cardiac Rhythm NSR was reviewed with the receiving nurse.    Opportunity for questions and clarification was provided.      Assessment completed upon patient???s arrival to unit and care assumed.     1920 .Bedside and Verbal shift change report given to [employer] developer) by Rajinder Toy Care rn (offgoing nurse). Report included the following information SBAR, Kardex, Intake/Output, MAR, Med Rec Status and Cardiac Rhythm NSR.

## 2015-07-26 ENCOUNTER — Inpatient Hospital Stay: Admit: 2015-07-26 | Payer: MEDICAID | Primary: Family Medicine

## 2015-07-26 ENCOUNTER — Observation Stay: Admit: 2015-07-26 | Payer: MEDICAID | Primary: Family Medicine

## 2015-07-26 LAB — METABOLIC PANEL, COMPREHENSIVE
A-G Ratio: 1.2 (ref 1.1–2.2)
ALT (SGPT): 35 U/L (ref 12–78)
AST (SGOT): 41 U/L — ABNORMAL HIGH (ref 15–37)
Albumin: 3.1 g/dL — ABNORMAL LOW (ref 3.5–5.0)
Alk. phosphatase: 103 U/L (ref 45–117)
Anion gap: 6 mmol/L (ref 5–15)
BUN/Creatinine ratio: 16 (ref 12–20)
BUN: 14 MG/DL (ref 6–20)
Bilirubin, total: 0.4 MG/DL (ref 0.2–1.0)
CO2: 31 mmol/L (ref 21–32)
Calcium: 8 MG/DL — ABNORMAL LOW (ref 8.5–10.1)
Chloride: 111 mmol/L — ABNORMAL HIGH (ref 97–108)
Creatinine: 0.89 MG/DL (ref 0.70–1.30)
GFR est AA: >60 mL/min/{1.73_m2} (ref >60)
GFR est non-AA: >60 mL/min/{1.73_m2} (ref >60)
Globulin: 2.6 g/dL (ref 2.0–4.0)
Glucose: 98 mg/dL (ref 65–100)
Potassium: 3.5 mmol/L (ref 3.5–5.1)
Protein, total: 5.7 g/dL — ABNORMAL LOW (ref 6.4–8.2)
Sodium: 148 mmol/L — ABNORMAL HIGH (ref 136–145)

## 2015-07-26 LAB — LIPID PANEL
CHOL/HDL Ratio: 2.1 (ref 0–5.0)
Cholesterol, total: 94 MG/DL (ref <200)
HDL Cholesterol: 45 MG/DL
LDL, calculated: 30.8 MG/DL (ref 0–100)
Triglyceride: 91 MG/DL (ref <150)
VLDL, calculated: 18.2 MG/DL

## 2015-07-26 LAB — METABOLIC PANEL, BASIC
Anion gap: 8 mmol/L (ref 5–15)
BUN/Creatinine ratio: 17 (ref 12–20)
BUN: 14 MG/DL (ref 6–20)
CO2: 29 mmol/L (ref 21–32)
Calcium: 8.2 MG/DL — ABNORMAL LOW (ref 8.5–10.1)
Chloride: 111 mmol/L — ABNORMAL HIGH (ref 97–108)
Creatinine: 0.83 MG/DL (ref 0.70–1.30)
GFR est AA: >60 mL/min/{1.73_m2} (ref >60)
GFR est non-AA: >60 mL/min/{1.73_m2} (ref >60)
Glucose: 91 mg/dL (ref 65–100)
Potassium: 3.4 mmol/L — ABNORMAL LOW (ref 3.5–5.1)
Sodium: 148 mmol/L — ABNORMAL HIGH (ref 136–145)

## 2015-07-26 LAB — CBC WITH AUTOMATED DIFF
ABS. BASOPHILS: 0 10*3/uL (ref 0.0–0.1)
ABS. EOSINOPHILS: 0.1 10*3/uL (ref 0.0–0.4)
ABS. LYMPHOCYTES: 1.3 10*3/uL (ref 0.8–3.5)
ABS. MONOCYTES: 0.4 10*3/uL (ref 0.0–1.0)
ABS. NEUTROPHILS: 2.5 10*3/uL (ref 1.8–8.0)
BASOPHILS: 1 % (ref 0–1)
EOSINOPHILS: 1 % (ref 0–7)
HCT: 39 % (ref 36.6–50.3)
HGB: 13 g/dL (ref 12.1–17.0)
LYMPHOCYTES: 31 % (ref 12–49)
MCH: 30.7 PG (ref 26.0–34.0)
MCHC: 33.3 g/dL (ref 30.0–36.5)
MCV: 92.2 FL (ref 80.0–99.0)
MONOCYTES: 9 % (ref 5–13)
NEUTROPHILS: 58 % (ref 32–75)
PLATELET: 119 10*3/uL — ABNORMAL LOW (ref 150–400)
RBC: 4.23 M/uL (ref 4.10–5.70)
RDW: 13.7 % (ref 11.5–14.5)
WBC: 4.4 10*3/uL (ref 4.1–11.1)

## 2015-07-26 LAB — CBC W/O DIFF
HCT: 40.1 % (ref 36.6–50.3)
HGB: 13 g/dL (ref 12.1–17.0)
MCH: 30.1 PG (ref 26.0–34.0)
MCHC: 32.4 g/dL (ref 30.0–36.5)
MCV: 92.8 FL (ref 80.0–99.0)
PLATELET: 113 10*3/uL — ABNORMAL LOW (ref 150–400)
RBC: 4.32 M/uL (ref 4.10–5.70)
RDW: 14.1 % (ref 11.5–14.5)
WBC: 4 10*3/uL — ABNORMAL LOW (ref 4.1–11.1)

## 2015-07-26 LAB — GLUCOSE, POC: Glucose (POC): 93 mg/dL (ref 65–100)

## 2015-07-26 LAB — PROTHROMBIN TIME + INR
INR: 1.1 (ref 0.9–1.1)
Prothrombin time: 11 s (ref 9.0–11.1)

## 2015-07-26 LAB — HEMOGLOBIN A1C WITH EAG
Est. average glucose: 117 mg/dL
Hemoglobin A1c: 5.7 % (ref 4.2–6.3)

## 2015-07-26 LAB — PTT: aPTT: 26.1 s (ref 22.1–32.5)

## 2015-07-26 LAB — EKG, 12 LEAD, INITIAL
Atrial Rate: 91 {beats}/min
Calculated P Axis: 33 degrees
Calculated R Axis: 3 degrees
Calculated T Axis: 27 degrees
Diagnosis: NORMAL
P-R Interval: 176 ms
Q-T Interval: 388 ms
QRS Duration: 96 ms
QTC Calculation (Bezet): 477 ms
Ventricular Rate: 91 {beats}/min

## 2015-07-26 LAB — TROPONIN I: Troponin-I, Qt.: <0.04 ng/mL (ref <0.05)

## 2015-07-26 LAB — CK W/ CKMB & INDEX
CK - MB: 1.3 NG/ML (ref 0.5–3.6)
CK-MB Index: 2.4 (ref 0–2.5)
CK: 55 U/L (ref 39–308)

## 2015-07-26 LAB — POC INR: INR (POC): 1 (ref <1.2)

## 2015-07-26 MED ORDER — OXYCODONE-ACETAMINOPHEN 5 MG-325 MG TAB
5-325 mg | Freq: Four times a day (QID) | ORAL | Status: DC | PRN
Start: 2015-07-26 — End: 2015-07-27

## 2015-07-26 MED ORDER — SODIUM CHLORIDE 0.9 % IV
Freq: Once | INTRAVENOUS | Status: AC
Start: 2015-07-26 — End: 2015-07-26
  Administered 2015-07-26: 15:00:00 via INTRAVENOUS

## 2015-07-26 MED ORDER — ACETAMINOPHEN 325 MG TABLET
325 mg | ORAL | Status: DC | PRN
Start: 2015-07-26 — End: 2015-07-25

## 2015-07-26 MED ORDER — MIRTAZAPINE 15 MG TAB
15 mg | Freq: Every evening | ORAL | Status: DC
Start: 2015-07-26 — End: 2015-07-27
  Administered 2015-07-27: 04:00:00 via ORAL

## 2015-07-26 MED ORDER — SODIUM CHLORIDE 0.9 % IJ SYRG
INTRAMUSCULAR | Status: DC | PRN
Start: 2015-07-26 — End: 2015-07-27

## 2015-07-26 MED ORDER — CHOLECALCIFEROL (VITAMIN D3) 1,000 UNIT (25 MCG) TAB
Freq: Every day | ORAL | Status: DC
Start: 2015-07-26 — End: 2015-07-27
  Administered 2015-07-26 – 2015-07-27 (×2): via ORAL

## 2015-07-26 MED ORDER — IBUPROFEN 600 MG TAB
600 mg | Freq: Four times a day (QID) | ORAL | Status: DC | PRN
Start: 2015-07-26 — End: 2015-07-25
  Administered 2015-07-26: 01:00:00 via ORAL

## 2015-07-26 MED ORDER — .PHARMACY TO SUBSTITUTE PER PROTOCOL
Status: DC | PRN
Start: 2015-07-26 — End: 2015-07-25

## 2015-07-26 MED ORDER — POTASSIUM CHLORIDE SR 10 MEQ TAB
10 mEq | ORAL | Status: AC
Start: 2015-07-26 — End: 2015-07-26
  Administered 2015-07-26: 14:00:00 via ORAL

## 2015-07-26 MED ORDER — ACETAMINOPHEN (TYLENOL) SOLUTION 32MG/ML
ORAL | Status: DC | PRN
Start: 2015-07-26 — End: 2015-07-25

## 2015-07-26 MED ORDER — MIRTAZAPINE 15 MG TAB
15 mg | Freq: Every evening | ORAL | Status: DC
Start: 2015-07-26 — End: 2015-07-26
  Administered 2015-07-26: 02:00:00 via ORAL

## 2015-07-26 MED ORDER — ARIPIPRAZOLE 5 MG TAB
5 mg | Freq: Every day | ORAL | Status: DC
Start: 2015-07-26 — End: 2015-07-27
  Administered 2015-07-26 – 2015-07-27 (×2): via ORAL

## 2015-07-26 MED ORDER — GADOBUTROL 15 MMOL/15 ML (1 MMOL/ML) IV
15 mmol/ mL (1 mmol/mL) | Freq: Once | INTRAVENOUS | Status: AC
Start: 2015-07-26 — End: 2015-07-26
  Administered 2015-07-26: 15:00:00 via INTRAVENOUS

## 2015-07-26 MED ORDER — CARBIDOPA-LEVODOPA 25 MG-100 MG TAB
25-100 mg | Freq: Three times a day (TID) | ORAL | Status: DC
Start: 2015-07-26 — End: 2015-07-26
  Administered 2015-07-26 (×2): via ORAL

## 2015-07-26 MED ORDER — PRAVASTATIN 40 MG TAB
40 mg | Freq: Every evening | ORAL | Status: DC
Start: 2015-07-26 — End: 2015-07-25

## 2015-07-26 MED ORDER — ASPIRIN 81 MG CHEWABLE TAB
81 mg | Freq: Every day | ORAL | Status: DC
Start: 2015-07-26 — End: 2015-07-27
  Administered 2015-07-26 – 2015-07-27 (×2): via ORAL

## 2015-07-26 MED ORDER — GABAPENTIN 300 MG CAP
300 mg | Freq: Three times a day (TID) | ORAL | Status: DC
Start: 2015-07-26 — End: 2015-07-27
  Administered 2015-07-26 – 2015-07-27 (×4): via ORAL

## 2015-07-26 MED ORDER — ATORVASTATIN 40 MG TAB
40 mg | Freq: Every day | ORAL | Status: DC
Start: 2015-07-26 — End: 2015-07-27
  Administered 2015-07-26 – 2015-07-27 (×2): via ORAL

## 2015-07-26 MED ORDER — IBUPROFEN 100 MG/5 ML ORAL SUSP
100 mg/5 mL | Freq: Four times a day (QID) | ORAL | Status: DC | PRN
Start: 2015-07-26 — End: 2015-07-25

## 2015-07-26 MED ORDER — ACETAMINOPHEN 650 MG RECTAL SUPPOSITORY
650 mg | RECTAL | Status: DC | PRN
Start: 2015-07-26 — End: 2015-07-25

## 2015-07-26 MED ORDER — DOCUSATE SODIUM 100 MG CAP
100 mg | Freq: Two times a day (BID) | ORAL | Status: DC | PRN
Start: 2015-07-26 — End: 2015-07-27

## 2015-07-26 MED ORDER — CLOPIDOGREL 75 MG TAB
75 mg | ORAL | Status: AC
Start: 2015-07-26 — End: 2015-07-26
  Administered 2015-07-26: 14:00:00 via ORAL

## 2015-07-26 MED ORDER — PANTOPRAZOLE 40 MG TAB, DELAYED RELEASE
40 mg | Freq: Every day | ORAL | Status: DC
Start: 2015-07-26 — End: 2015-07-27
  Administered 2015-07-26 – 2015-07-27 (×2): via ORAL

## 2015-07-26 MED ORDER — LORAZEPAM 2 MG/ML IJ SOLN
2 mg/mL | Freq: Four times a day (QID) | INTRAMUSCULAR | Status: DC | PRN
Start: 2015-07-26 — End: 2015-07-27
  Administered 2015-07-26: 14:00:00 via INTRAVENOUS

## 2015-07-26 MED ORDER — TAMSULOSIN SR 0.4 MG 24 HR CAP
0.4 mg | Freq: Every evening | ORAL | Status: DC
Start: 2015-07-26 — End: 2015-07-27
  Administered 2015-07-27: 04:00:00 via ORAL

## 2015-07-26 MED ORDER — SODIUM CHLORIDE 0.9 % IJ SYRG
Freq: Three times a day (TID) | INTRAMUSCULAR | Status: DC
Start: 2015-07-26 — End: 2015-07-27
  Administered 2015-07-26 – 2015-07-27 (×6): via INTRAVENOUS

## 2015-07-26 MED ORDER — CYANOCOBALAMIN 1,000 MCG/ML IJ SOLN
1000 mcg/mL | INTRAMUSCULAR | Status: DC
Start: 2015-07-26 — End: 2015-07-27

## 2015-07-26 MED ORDER — GABAPENTIN 300 MG CAP
300 mg | Freq: Two times a day (BID) | ORAL | Status: DC
Start: 2015-07-26 — End: 2015-07-26
  Administered 2015-07-26 (×2): via ORAL

## 2015-07-26 MED ORDER — SODIUM CHLORIDE 0.9 % IV
INTRAVENOUS | Status: DC
Start: 2015-07-26 — End: 2015-07-26
  Administered 2015-07-26: 01:00:00 via INTRAVENOUS

## 2015-07-26 MED ORDER — FLUOXETINE 20 MG CAP
20 mg | Freq: Every day | ORAL | Status: DC
Start: 2015-07-26 — End: 2015-07-27
  Administered 2015-07-26 – 2015-07-27 (×2): via ORAL

## 2015-07-26 MED ORDER — ENOXAPARIN 40 MG/0.4 ML SUB-Q SYRINGE
40 mg/0.4 mL | SUBCUTANEOUS | Status: DC
Start: 2015-07-26 — End: 2015-07-27
  Administered 2015-07-26 – 2015-07-27 (×2): via SUBCUTANEOUS

## 2015-07-26 MED ORDER — MEPERIDINE (PF) 25 MG/ML INJ SOLUTION
25 mg/ml | INTRAMUSCULAR | Status: DC | PRN
Start: 2015-07-26 — End: 2015-07-25

## 2015-07-26 MED ORDER — CARBIDOPA-LEVODOPA 25 MG-100 MG TAB
25-100 mg | Freq: Three times a day (TID) | ORAL | Status: DC
Start: 2015-07-26 — End: 2015-07-27
  Administered 2015-07-26 – 2015-07-27 (×4): via ORAL

## 2015-07-26 MED FILL — ATORVASTATIN 40 MG TAB: 40 mg | ORAL | Qty: 1

## 2015-07-26 MED FILL — GABAPENTIN 300 MG CAP: 300 mg | ORAL | Qty: 1

## 2015-07-26 MED FILL — CHILDREN'S ASPIRIN 81 MG CHEWABLE TABLET: 81 mg | ORAL | Qty: 1

## 2015-07-26 MED FILL — FLUOXETINE 20 MG CAP: 20 mg | ORAL | Qty: 1

## 2015-07-26 MED FILL — BD POSIFLUSH NORMAL SALINE 0.9 % INJECTION SYRINGE: INTRAMUSCULAR | Qty: 10

## 2015-07-26 MED FILL — CARBIDOPA-LEVODOPA 25 MG-100 MG TAB: 25-100 mg | ORAL | Qty: 2

## 2015-07-26 MED FILL — DEMEROL (PF) 25 MG/ML INJECTION SYRINGE: 25 mg/mL | INTRAMUSCULAR | Qty: 1

## 2015-07-26 MED FILL — CARBIDOPA-LEVODOPA 25 MG-100 MG TAB: 25-100 mg | ORAL | Qty: 1

## 2015-07-26 MED FILL — CLOPIDOGREL 75 MG TAB: 75 mg | ORAL | Qty: 4

## 2015-07-26 MED FILL — POTASSIUM CHLORIDE SR 10 MEQ TAB: 10 mEq | ORAL | Qty: 4

## 2015-07-26 MED FILL — ENOXAPARIN 40 MG/0.4 ML SUB-Q SYRINGE: 40 mg/0.4 mL | SUBCUTANEOUS | Qty: 0.4

## 2015-07-26 MED FILL — SODIUM CHLORIDE 0.9 % IV: INTRAVENOUS | Qty: 50

## 2015-07-26 MED FILL — LORAZEPAM 2 MG/ML IJ SOLN: 2 mg/mL | INTRAMUSCULAR | Qty: 1

## 2015-07-26 MED FILL — GADAVIST 15 MMOL/15 ML (1 MMOL/ML) INTRAVENOUS SOLUTION: 15 mmol/ mL (1 mmol/mL) | INTRAVENOUS | Qty: 15

## 2015-07-26 MED FILL — ABILIFY 5 MG TABLET: 5 mg | ORAL | Qty: 3

## 2015-07-26 MED FILL — PROTONIX 40 MG TABLET,DELAYED RELEASE: 40 mg | ORAL | Qty: 1

## 2015-07-26 MED FILL — VITAMIN D3 25 MCG (1,000 UNIT) TABLET: 25 mcg (1,000 unit) | ORAL | Qty: 3

## 2015-07-26 MED FILL — SODIUM CHLORIDE 0.9 % IV: INTRAVENOUS | Qty: 1000

## 2015-07-26 MED FILL — MIRTAZAPINE 15 MG TAB: 15 mg | ORAL | Qty: 1

## 2015-07-26 MED FILL — IBUPROFEN 600 MG TAB: 600 mg | ORAL | Qty: 1

## 2015-07-26 NOTE — Progress Notes (Signed)
Problem: Mobility Impaired (Adult and Pediatric)  Goal: *Acute Goals and Plan of Care (Insert Text)  Physical Therapy Goals  Initiated 07/26/2015  1. Patient will move from supine to sit and sit to supine , scoot up and down and roll side to side in bed with independence within 7 day(s).   2. Patient will transfer from bed to chair and chair to bed with modified independence using the least restrictive device within 7 day(s).  3. Patient will perform sit to stand with modified independence within 7 day(s).  4. Patient will ambulate with modified independence for 300 feet with the least restrictive device within 7 day(s).    PHYSICAL THERAPY EVALUATION  SEEN 1130 TO 1155  Patient: Dale Weaver [id number] y.o. male)  Date: 07/26/2015  Primary Diagnosis: TIA  CVA (cerebral vascular accident) [facility])        Precautions:   Fall      ASSESSMENT :  Based on the objective data described below, the patient presents with decreased strength, endurance and functional independence.  Pt needed CGA for sit to stand transfers, and to ambulate 150'. Displays Parkinson's gait, but no LOB and suspect this is his baseline mobility. He reports having a r/w as well as a rollator that he uses at home.  He also has someone at home that helps him as needed with ADL's. He is a good candidate for Outpatient movement disorders clinic if available.  Patient will benefit from skilled intervention to address the above impairments.  Patient???s rehabilitation potential is considered to be Fair  Factors which may influence rehabilitation potential include:   [ ]          None noted  [ ]          Mental ability/status  [X]          Medical condition  [X]          Home/family situation and support systems  [ ]          Safety awareness  [ ]          Pain tolerance/management  [ ]          Other:        PLAN :  Recommendations and Planned Interventions:  [X]            Bed Mobility Training             [ ]     Neuromuscular Re-Education   [X]            Transfer Training                   [ ]     Orthotic/Prosthetic Training  [X]            Gait Training                         [ ]     Modalities  [X]            Therapeutic Exercises           [ ]     Edema Management/Control  [X]            Therapeutic Activities            [X]     Patient and Family Training/Education  [ ]            Other (comment):     Frequency/Duration: Patient will be followed by physical therapy  4 times a week to address goals.  Discharge  Recommendations: Outpatient  Further Equipment Recommendations for Discharge: none       SUBJECTIVE:   Patient stated ???My right side feels a little weak.???      OBJECTIVE DATA SUMMARY:       Past Medical History   Diagnosis Date   ??? Back pain         chronic   ??? CAD (coronary artery disease)         MI 2008   ??? Heart failure (HCC)         CHF   ??? Hiatal hernia     ??? Parkinson's disease ([place])     ??? Psychiatric disorder         anxiety   ??? TIA (transient ischemic attack) 01/2014     Past Surgical History   Procedure Laterality Date   ??? Hx orthopaedic           back surgery x4, last June 2010   ??? Hx appendectomy       ??? Hx cholecystectomy       ??? Hx hernia repair       ??? Pr cardiac surg procedure unlist           cardiac cath August 2009   ??? Pr cardiac surg procedure unlist   2010       x 1 [facility]   ??? Pr cardiac surg procedure unlist           insertion of loop recorder removed 06/2014     Prior Level of Function/Home Situation: Has in home assistance with ADL's  Home Situation  One/Two Story Residence: Two story  Living Alone: No  Support Systems:  (room mates and PCA)  Patient Expects to be Discharged to:: Group home  Current DME Used/Available at Home: Cane, quad, [place], straight, Tub transfer bench, Walker, rolling  Tub or Shower Type: Tub/Shower combination  Critical Behavior:  Neurologic State: Alert, Appropriate for age  Orientation Level: Appropriate for age  Cognition: Appropriate decision making, Appropriate for age  attention/concentration, Appropriate safety awareness, Follows commands  Safety/Judgement: Awareness of environment  Strength:    Strength: Generally decreased, functional (R UE 3/5; L UE 4/5 )      Range Of Motion:  AROM: Within functional limits  Coordination:  Coordination: Generally decreased, functional (difficulty with opening containers; close to baseline)     Functional Mobility:  Bed Mobility:  Rolling: Modified independent  Supine to Sit: Modified independent  Transfers:  Sit to Stand: Contact guard assistance  Balance:   Sitting: Intact  Standing: Impaired  Standing - Static: Good  Standing - Dynamic : Fair  Ambulation/Gait Training:  Distance (ft): 150 Feet (ft)  Assistive Device: Gait belt;Walker, rolling  Ambulation - Level of Assistance: Contact guard assistance  Gait Abnormalities: Festinating gait  Base of Support: Widened        Functional Measure:  Berg Balance Test:      Sitting to Standing: 3  Standing Unsupported: 3  Sitting with Back Unsupported: 4  Standing to Sitting: 3  Transfers: 4  Standing Unsupported with Eyes Closed: 4  Standing Unsupported with Feet Together: 3  Reach Forward with Outstretched Arm: 4  Pick Up Object: 3  Turn to Look Over Shoulders: 2  Turn 360 Degrees: 4  Alternate Foot on Step/Stool: 3  Standing Unsupported One Foot in Front: 2  Stand on One Leg: 2  Total: 44  56=Maximum possible score;   0-20=High fall risk  21-40=Moderate fall risk   41-56=Low fall risk      Berg Balance Test and G-code impairment scale:  Percentage of Impairment CH     0%    CI     1-19% CJ     20-39% CK     40-59% CL     60-79% CM     80-99% CN      100%   Berg   Score 0-56 56 45-55 34-44 23-33 12-22 1-11 0             ?? Mobility - Walking and Moving Around:              [id number] - CURRENT STATUS:       CJ - 20%-39% impaired, limited or restricted              [id number] - GOAL STATUS:               CI - 1%-19% impaired, limited or restricted               [id number] - D/C STATUS:                       ---------------To be determined---------------      Pain:  Pain Scale 1: Visual  Pain Intensity 1: 0              Activity Tolerance:   Fatigued quickly  Please refer to the flowsheet for vital signs taken during this treatment.  After treatment:   [X]          Patient left in no apparent distress sitting up in chair  [ ]          Patient left in no apparent distress in bed  [X]          Call bell left within reach  [X]          Nursing notified  [ ]          Caregiver present  [ ]          Bed alarm activated      COMMUNICATION/EDUCATION:   The patient???s plan of care was discussed with: Occupational Therapist and Registered Nurse.  [X]          Fall prevention education was provided and the patient/caregiver indicated understanding.  [X]          Patient/family have participated as able in goal setting and plan of care.  [X]          Patient/family agree to work toward stated goals and plan of care.  [ ]          Patient understands intent and goals of therapy, but is neutral about his/her participation.  [ ]          Patient is unable to participate in goal setting and plan of care.     Thank you for this referral.  Janan Ridge, PT

## 2015-07-26 NOTE — Progress Notes (Signed)
Initial Neurology Evaluation - Preliminary Note    Patient seen, chart reviewed and neurology consultation report dictated.      Impression:  TIA  Disorder of sensation  Rule out seizure disorder  Rule out thromboembolic phenomenon.  Plan:  EEG  MRI Brain  MRA Neck and Brain  Plavix 75mg  p.o every day  Blood for autoimmune work up  PT/OT Evaluation    Will follow patient's course along with you as necessary. Thank you for the opportunity to participate in the care of your patient.    Topacio Cella Glenford Peers, MD  07/26/2015

## 2015-07-26 NOTE — Progress Notes (Signed)
Spiritual Care Assessment/Progress Notes    Dale Weaver [id number]  [id number]    1951/05/24  64 y.o.  male    Patient Telephone Number: There is no home phone number on file.   Religious Affiliation: Catholic   Language: English   Extended Emergency Sport and exercise psychologist Information  Primary Emergency Contact: [place] Phone: [phone]  Relation: Friend   Patient Active Problem List    Diagnosis Date Noted   ??? CVA (cerebral vascular accident) ([place]) 07/26/2015   ??? GERD (gastroesophageal reflux disease) 07/04/2015   ??? HTN (hypertension), benign 07/04/2015   ??? Parkinson disease ([place]) 07/04/2015   ??? TIA (transient ischemic attack) 07/04/2015   ??? Conversion disorder 12/02/2014   ??? Chest pain 06/01/2014   ??? Dyslipidemia 06/01/2014   ??? S/P angioplasty with stent--2008 06/01/2014   ??? Status post placement of implantable loop recorder 2013 06/01/2014   ??? 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 H/O alcohol abuse--alcohol-free since 3 yrs ago 06/01/2014   ??? 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 CVA (cerebral infarction) 01/20/2014        Date: 07/26/2015       Level of Religious/Spiritual Activity:           Involved in faith tradition/spiritual practice             Not involved in faith tradition/spiritual practice           Spiritually oriented             Claims no spiritual orientation             seeking spiritual identity           Feels alienated from religious practice/tradition           Feels angry about religious practice/tradition           Spirituality/religious PRAYER IS a resource for coping at this time.           Not able to assess due to medical condition    Services Provided Today:           crisis intervention             reading Scriptures           spiritual assessment             prayer           empathic listening/emotional support           rites and rituals (cite in comments)           life review              religious support            theological development            advocacy           ethical dialog              blessing           bereavement support             support to family           anticipatory grief support            help with AMD           spiritual guidance             meditation  Spiritual Care Needs           Emotional Support           Spiritual/Religious Care           Loss/Adjustment           Advocacy/Referral                /Ethics           No needs expressed at               this time           Other: (note in               comments)  Spiritual Care Plan           Follow up visits with               pt/family           Provide materials           Schedule sacraments           Contact Community               Clergy           Follow up as needed           Other: (note in               comments)     Comments: Initial spiritual assessment in Med Surg/Tele unit.  Mr. Steveson is known to me from previous visits.  He shared his recent concern with the testing that is being done on his heart.  He also shared some stressors in his life.  We shared about his challenges and his hope that things work out.  He also shared he has options.  He asked for prayer for peace and tranquillity.  Provided spiritual presence and prayer.  Advised of Chaplain Availability.  Visited by: Ashley Mariner [place] [phone])

## 2015-07-26 NOTE — Progress Notes (Signed)
Pharmacy Medication Reconciliation     Recommendations/Findings:   1) Please interpret medication list with caution.  Patient is reportedly a poor historian and request was made to clarify list with the [place].  2) Removed tizanidine and docusate.  3) Changed Sinemet, cholecalciferol, gabapentin, mirtazepine, Percocet.  4) Added methocarbamol, carbidopa, clonazepam, tamsulosin, and miconzaole.      -Clarified PTA med list with Q[facility]a Skye from [facility]. PTA medication list was corrected to the following:     Prior to Admission Medications   Prescriptions Last Dose Informant Patient Reported? Taking?   ARIPiprazole (ABILIFY) 30 mg tablet  Transfer Papers Yes Yes   Sig: Take 15 mg by mouth daily.   FLUoxetine (PROZAC) 20 mg tablet  Transfer Papers Yes Yes   Sig: Take 20 mg by mouth daily.   atorvastatin (LIPITOR) 80 mg tablet  Transfer Papers Yes Yes   Sig: Take 40 mg by mouth daily.   carbidopa 25 mg tab  Transfer Papers Yes Yes   Sig: Take 50 mg by mouth three (3) times daily.   carbidopa-levodopa (SINEMET) 25-100 mg per tablet  Transfer Papers Yes Yes   Sig: Take 1 Tab by mouth three (3) times daily.   cholecalciferol (VITAMIN D3) 1,000 unit tablet  Transfer Papers Yes Yes   Sig: Take 3,000 Units by mouth daily.   clonazePAM (KLONOPIN) 1 mg tablet  Transfer Papers Yes Yes   Sig: Take 0.5 mg by mouth three (3) times daily.   cyanocobalamin (VITAMIN B12) 1,000 mcg/mL injection  Transfer Papers Yes Yes   Sig: 1,000 mcg by IntraMUSCular route every thirty (30) days.   gabapentin (NEURONTIN) 300 mg capsule  Transfer Papers Yes Yes   Sig: Take 600 mg by mouth every eight (8) hours.   methocarbamol (ROBAXIN) 750 mg tablet  Transfer Papers Yes Yes   Sig: Take 750 mg by mouth every eight (8) hours.   miconazole (MICOTIN) 2 % topical powder  Transfer Papers Yes Yes   Sig: Apply  to affected area daily. Apply to affected area   mirtazapine (REMERON) 30 mg tablet  Transfer Papers Yes Yes   Sig: Take 30 mg by mouth nightly.    omeprazole (PRILOSEC) 20 mg capsule  Transfer Papers Yes Yes   Sig: Take 20 mg by mouth two (2) times a day.   oxyCODONE-acetaminophen (PERCOCET) 5-325 mg per tablet  Transfer Papers Yes Yes   Sig: Take 1 Tab by mouth every six (6) hours as needed for Pain.   tamsulosin (FLOMAX) 0.4 mg capsule  Transfer Papers Yes Yes   Sig: Take 0.4 mg by mouth nightly.      Facility-Administered Medications: None          Thank you,  Kate Sable, PHARMD, BCPS  Contact: [phone]

## 2015-07-26 NOTE — Progress Notes (Signed)
Echocardiogram 2D complete adult completed as ordered.?? Procedure explained. Full report to follow.

## 2015-07-26 NOTE — Progress Notes (Signed)
Stroke S was called at 7:17 a.m. Myself and ICU Nurse were present at bedside. Calculated NIHSS was 3.Last time he was found to be @baseline  was at 5:am this morning.  Tele Nuerologist consulted stat. After brief discussion , decision was made for no TPA.   Head CT was reviewed which was unremarkable. Decision was made to add Plavix 300 mg loading dose to ASA today followed with 75 mg daily with  Aspirin . Stat MRI and MRA head and neck were ordered. Patient speech returned to Baseline after 35 minutes.

## 2015-07-26 NOTE — Progress Notes (Signed)
Code S called on pt this AM, pt currently undergoing imaging.  Will follow up for PT Evaluation.    Lemont Fillers, PT,MS

## 2015-07-26 NOTE — Progress Notes (Addendum)
0700) New oncet of slurred speech.  Code stroke called.  Response by ICU Charul, RN, Iioychi, RN, Sharyn Lull, RN, Dawn, RN, Mendel Ryder, PCT(ED), Nira Conn, RN.  INR 1.0.  BG 91.  Teleneurologist notified.  Labs drawn.  PT taken to CT.  5 AM last normal reported by Iioychi, RN night shift.   0745) NIH scale 6.0 per Charul, ICU nurse  [phone]) Dr. Billy Fischer at bedside  [phone]) NIH scale 3.0 per Dr. Billy Fischer  0820) Geryl Rankins teleneurologist on screen, assessing pt.  [phone]) Pt downstairs for MRI  1100) Consult Dr. Billy Fischer for slight right leg weakness/speech improved  1400) Pt downstairs to cardiology  1450) Pt return to unit.  1800) EEG with pt.  1920) Bedside shift change report given to Nira Conn, RN (oncoming nurse) by Drema Pry, RN (offgoing nurse). Report included the following information SBAR, Kardex, MAR, Accordion, Recent Results and Cardiac Rhythm NSR with brief sinus arrythmia.

## 2015-07-26 NOTE — Progress Notes (Signed)
Bedside shift change report given to Heather,RN (oncoming nurse) by Iiyochi,RN (offgoing nurse). Report included the following information SBAR, Kardex, Intake/Output, MAR and Recent Results.

## 2015-07-26 NOTE — Progress Notes (Signed)
Notified Dr. Floy Sabina of patient's potassium of 3.4, sodium of 148 and calcium of 8.2. She stated she would place an order to replenish potassium.

## 2015-07-26 NOTE — Progress Notes (Signed)
Tracy  Debbe Bales, MD    Hospitalist Progress Note      NAME: Dale Weaver   DOB:  [birth year]  MRM:  [id number]    Date/Time: 07/26/2015  2:12 PM         Assessment / Plan:       Stroke S was called at 7:17 a.m. Myself and ICU Nurse were present at bedside. Calculated NIHSS was 3.Last time he was found to be @baseline  was at 5:am this morning.  Tele Nuerologist consulted stat. After brief discussion , decision was made for no TPA.    Head CT was reviewed which was unremarkable. Decision was made to add Plavix 300 mg loading dose to ASA today followed with 75 mg daily with Aspirin . Stat MRI and MRA head and neck were ordered. Patient speech returned to Baseline after 35 minutes.      TIA (transient ischemic attack) (07/04/2015)POA v/s ?complex partial seizures?    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    IMPRESSION:    Negative MRA of the neck and head.     MRI BRAIN    FINDINGS:    The ventricles are normal in size and are midline. There is no intracranial  hemorrhage or extra-axial fluid collection. There is no significant white  matter disease. There is no acute infarction. The major intracranial vascular  flow-voids are patent. There is no abnormal parenchymal or meningeal  enhancement. The paranasal sinuses and mastoid air cells are clear.     IMPRESSION:    No significant abnormalities.         PATIENT STATUS CHANGED TO INPATIENT              Subjective: Patient is now back to baseline. No slurred speech     Chief Complaint:  Slurred speech    ROS:  Constitutional: negative for fevers, chills, sweats, fatigue, malaise, anorexia and weight loss   Eyes: negative for irritation, redness and icterus    Ears, nose, mouth, throat, and face: negative for ear drainage, earaches, nasal congestion, sore mouth and sore throat   Respiratory: negative for cough, sputum, hemoptysis, pleurisy/chest pain, asthma, wheezing or dyspnea on exertion   Cardiovascular: negative for dyspnea, palpitations, irregular heart beats, near-syncope, syncope, orthopnea, paroxysmal nocturnal dyspnea, lower extremity edema, tachypnea   Gastrointestinal: negative for nausea, vomiting, diarrhea, constipation and abdominal pain   Genitourinary:negative for frequency and dysuria   Hematologic/lymphatic: negative for easy bruising, bleeding, lymphadenopathy, petechiae and coughing up blood   Musculoskeletal:negative for myalgias, arthralgias and muscle weakness   Neurological: negative for headaches and dizziness   Endocrine: Denies heat or cold intolerance       Objective:       Vitals:          Last 24hrs VS reviewed since prior progress note. Most recent are:    Visit Vitals   ??? BP 132/81   ??? Pulse 73   ??? Temp 98.1 ??F (36.7 ??C)   ??? Resp 12   ??? Ht 6\' 1"  (1.854 m)   ??? Wt 97.4 kg (214 lb 11.2 oz)   ???  SpO2 98%   ??? BMI 28.33 kg/m2     SpO2 Readings from Last 6 Encounters:   07/26/15 98%   07/05/15 91%   12/27/14 98%   12/03/14 95%   08/05/14 90%   06/07/14 97%          Intake/Output Summary (Last 24 hours) at 07/26/15 1412  Last data filed at 07/26/15 [id number]   Gross per 24 hour   Intake 758.33 ml   Output    600 ml   Net 158.33 ml          Exam:     Physical Exam:    Gen:  Well-developed, well-nourished, in no acute distress  HEENT:  Pink conjunctivae, PERRL, hearing intact to voice, moist mucous membranes  Neck:  Supple, without masses, thyroid non-tender  Resp:  No accessory muscle use, clear breath sounds without wheezes rales or rhonchi  Card:  No murmurs, normal S1, S2 without thrills, bruits or peripheral edema  Abd:  Soft, non-tender, non-distended, normoactive bowel sounds are present  Musc:  No cyanosis or clubbing   Skin:  No rashes or ulcers, skin turgor is good  Neuro:  Cranial nerves 3-12 are grossly intact, grip strength is 5/5 bilaterally and dorsi / plantarflexion is 5/5 bilaterally, follows commands appropriately,left hand tremors+  Psych:  Good insight, oriented to person, place and time, alert       Telemetry reviewed:   normal sinus rhythm    Medications Reviewed: (see below)    Lab Data Reviewed: (see below)    ______________________________________________________________________    Medications:     Current Facility-Administered Medications   Medication Dose Route Frequency   ??? LORazepam (ATIVAN) injection 1 mg  1 mg IntraVENous Q6H PRN   ??? carbidopa-levodopa (SINEMET) 25-100 mg per tablet 1 Tab  1 Tab Oral TID   ??? cholecalciferol (VITAMIN D3) tablet 3,000 Units  3,000 Units Oral DAILY   ??? mirtazapine (REMERON) tablet 30 mg  30 mg Oral QHS   ??? gabapentin (NEURONTIN) capsule 300 mg  300 mg Oral TID   ??? oxyCODONE-acetaminophen (PERCOCET) 5-325 mg per tablet 1 Tab  1 Tab Oral Q6H PRN   ??? tamsulosin (FLOMAX) capsule 0.4 mg  0.4 mg Oral QHS   ??? sodium chloride (NS) flush 5-10 mL  5-10 mL IntraVENous Q8H   ??? sodium chloride (NS) flush 5-10 mL  5-10 mL IntraVENous PRN   ??? aspirin chewable tablet 81 mg  81 mg Oral DAILY   ??? enoxaparin (LOVENOX) injection 40 mg  40 mg SubCUTAneous Q24H   ??? ARIPiprazole (ABILIFY) tablet 15 mg  15 mg Oral DAILY   ??? atorvastatin (LIPITOR) tablet 40 mg  40 mg Oral DAILY   ??? [START ON 08/03/2015] cyanocobalamin (VITAMIN B12) injection 1,000 mcg  1,000 mcg IntraMUSCular Q30D   ??? docusate sodium (COLACE) capsule 100 mg  100 mg Oral BID PRN   ??? FLUoxetine (PROzac) capsule 20 mg  20 mg Oral DAILY   ??? pantoprazole (PROTONIX) tablet 40 mg  40 mg Oral ACB            Lab Review:     Recent Labs      07/26/15   0735  07/26/15   0436  07/25/15   1627   WBC  4.4  4.0*  4.8   HGB  13.0  13.0  14.5   HCT  39.0  40.1  42.9   PLT  119*  113*  126*  Recent Labs      07/26/15   0735  07/26/15   0728  07/26/15    0436  07/25/15   1627   NA  148*   --   148*  144   K  3.5   --   3.4*  3.6   CL  111*   --   111*  107   CO2  31   --   29  29   GLU  98   --   91  127*   BUN  14   --   14  15   CREA  0.89   --   0.83  0.98   CA  8.0*   --   8.2*  8.3*   ALB  3.1*   --    --   3.8   SGOT  41*   --    --   70*   ALT  35   --    --   49   INR  1.1  1.0   --   1.1     No components found for: [facility]        Total time spent with patient: 48 [place] discussed with: Patient, Care Manager, Nursing Staff and Consultant/Specialist    Discussed:  Care Plan    Prophylaxis:  Lovenox    Disposition:  Home w/Family           ___________________________________________________    Attending Physician: Ree Kida, MD

## 2015-07-26 NOTE — Progress Notes (Signed)
Problem: Self Care Deficits Care Plan (Adult)  Goal: *Acute Goals and Plan of Care (Insert Text)  Occupational Therapy Goals  Initiated 07/26/2015  1. Patient will perform grooming standing at the sink with supervision/set-up within 7 day(s).  2. Patient will perform walk in toilet transfers with supervision/set-up within 7 day(s).  3. Patient will participate in R upper extremity therapeutic exercise/activities with supervision/set-up within 7 day(s).   4. Patient will utilize fall prevention techniques during functional activities with verbal cues within 7 day(s).  OCCUPATIONAL THERAPY EVALUATION  Patient: Dale Weaver [id number] y.o. male)  Date: 07/26/2015  Primary Diagnosis: TIA  CVA (cerebral vascular accident) [facility])        Precautions:   Fall (hx of Parkinsons)      ASSESSMENT :  Based on the objective data described below, the patient presents with right sided weakness and deficits that are related to patient's history of Parkinsons (decreased balance, shuffling gait, decreased [place]) however pt feels symptoms on R are worse than baseline.  Noted MRI (-) for acute infarction however R UE is 3/5 on MMT vs L UE which is 4/5.  Unable to complete Henriette Combs assessment due to need to take patient off the floor for testing however recommend completion of this assessment next visit.  Pt currently completes ADLs with minimal assist for balance and assist to open containers (occasionally; able to open sugar packet but unable to open juice).   This appears to be patient's functional baseline per his report (pt has a live in caregiver who provides MIN A for ADLs as needed; amount of assist needed is directly related to when he has taken his parkinsons meds).  Anticipate pt will be able to return home with no further OT needed however would follow up with Henriette Combs and providing a HEP to increase strength in B UEs (especially the R).  May benefit from follow up with OP therapy with specialty in movement disorders/parkinsons.        Patient will benefit from skilled intervention to address the above impairments.  Patient???s rehabilitation potential is considered to be Good  Factors which may influence rehabilitation potential include:   [ ]              None noted  [ ]              Mental ability/status  [X]              Medical condition  [ ]              Home/family situation and support systems  [ ]              Safety awareness  [ ]              Pain tolerance/management  [ ]              Other:        PLAN :  Recommendations and Planned Interventions:  [X]                Self Care Training                  [X]         Therapeutic Activities  [X]                Functional Mobility Training    [ ]         Cognitive Retraining  [X]   Therapeutic Exercises           [X]         Endurance Activities  [X]                Balance Training                   [ ]         Neuromuscular Re-Education  [ ]                Visual/Perceptual Training     [X]    Home Safety Training  [X]                Patient Education                 [X]         Family Training/Education  [ ]                Other (comment):     Frequency/Duration: Patient will be followed by occupational therapy 3 times a week to address goals.  Discharge Recommendations: Return home with 24 hour care.  May benefit from OP Physical/Occupational therapy to address Parkinson's (recommend OP neuro); also may benefit from resources such as Parkinsons Disease Support Groups (one available at [facility] with Arbie Cookey @ [phone] or [place] with Benn Moulder @ [phone])  Further Equipment Recommendations for Discharge: none       SUBJECTIVE:   Patient stated ???I' feel like I am weaker than before.???      OBJECTIVE DATA SUMMARY:       Past Medical History   Diagnosis Date   ??? Back pain         chronic   ??? CAD (coronary artery disease)         MI 2008   ??? Heart failure (HCC)         CHF   ??? Hiatal hernia     ??? Parkinson's disease ([place])      ??? Psychiatric disorder         anxiety   ??? TIA (transient ischemic attack) 01/2014     Past Surgical History   Procedure Laterality Date   ??? Hx orthopaedic           back surgery x4, last June 2010   ??? Hx appendectomy       ??? Hx cholecystectomy       ??? Hx hernia repair       ??? Pr cardiac surg procedure unlist           cardiac cath August 2009   ??? Pr cardiac surg procedure unlist   2010       x 1 stent[facility]   ??? Pr cardiac surg procedure unlist           insertion of loop recorder removed 06/2014     Prior Level of Function/Home Situation: Pt lives with 24 hour caregiver who assists as needed with ADLs.  Pt use a Tub transfer bench for tub transfers and needs assist occasionally.  Feel increased assist usually dependent on how he has scheduled his Parkinsons meds.  Does not drive any more.    Home Situation  Living Alone: No (lives with 24 hour caretaker)  Current DME Used/Available at Home: Cane, quad, Cane, straight, Tub transfer bench, Walker, rolling  Tub or Shower Type: Tub/Shower combination  [X]   Right hand dominant             [ ]   Left hand dominant  Cognitive/Behavioral Status:  Neurologic State: Alert;Appropriate for age  Orientation Level: Appropriate for age  Cognition: Appropriate decision making;Appropriate for age attention/concentration;Appropriate safety awareness;Follows commands  Perception: Appears intact  Perseveration: No perseveration noted  Safety/Judgement: Awareness of environment  Skin: Intact  Edema: None noted  Vision/Perceptual:            Acuity:  (reports increased blurriness from normal)    Corrective Lenses: Glasses  Range of Motion:  AROM: Within functional limits     Strength:  Strength: Generally decreased, functional (R UE 3/5; L UE 4/5 )     Coordination:  Coordination: Generally decreased, functional (difficulty with opening containers; close to baseline)  Fine Motor Skills-Upper: Left Impaired;Right Impaired    Gross Motor Skills-Upper: Left Intact;Right Intact   Tone & Sensation:  Tone: Normal     Balance:  Sitting: Intact  Standing: Impaired  Standing - Static: Good  Standing - Dynamic : Fair     Functional Mobility and Transfers for ADLs:  Bed Mobility:        Transfers:  Sit to Stand: Contact guard assistance  Toilet Transfer : Contact guard assistance (inferred)     ADL Assessment:  Feeding: Setup (assist to open containers)     Oral Facial Hygiene/Grooming: Setup (assist to open containers at times)     Bathing: Minimum assistance (inferred for bathing assist)     Upper Body Dressing: Supervision (inferred)     Lower Body Dressing: Minimum assistance (inferred for balance)     Toileting: Minimum assistance (inferred for balance)        ADL Intervention:     Lower Body Dressing Assistance  Socks: Supervision/set-up  Leg Crossed Method Used: No  Position Performed: Bending forward method;Seated in chair     Cognitive Retraining  Safety/Judgement: Awareness of environment     Functional Measure:  Barthel Index:      Bathing: 0  Bladder: 10  Bowels: 10  Grooming: 5  Dressing: 5  Feeding: 5  Mobility: 0  Stairs: 0  Toilet Use: 5  Transfer (Bed to Chair and Back): 10  Total: 50         Barthel and G-code impairment scale:  Percentage of impairment CH  0% CI  1-19% CJ  20-39% CK  40-59% CL  60-79% CM  80-99% CN  100%   Barthel Score 0-100 100 99-80 79-60 59-40 20-39 1-19    0   Barthel Score 0-20 20 17-19 13-16 9-12 5-8 1-4 0      The Barthel ADL Index: Guidelines  1. The index should be used as a record of what a patient does, not as a record of what a patient could do.  2. The main aim is to establish degree of independence from any help, physical or verbal, however minor and for whatever reason.  3. The need for supervision renders the patient not independent.  4. A patient's performance should be established using the best available evidence. Asking the patient, friends/relatives and nurses are the usual sources, but direct observation and common sense are also important.  However direct testing is not needed.  5. Usually the patient's performance over the preceding 24-48 hours is important, but occasionally longer periods will be relevant.  6. Middle categories imply that the patient supplies over 50 per cent of the effort.  7. Use of aids to be independent is allowed.     Daneen Schick., Barthel, D.W. [phone]). Functional evaluation: the Barthel Index.  [place] (14)2.  Lucianne Lei der [place], J.J.M.F, [place], Diona Browner., Oris Drone., [place], [place] (1999). Measuring the change indisability after inpatient rehabilitation; comparison of the responsiveness of the Barthel Index and Functional Independence Measure. Journal of Neurology, Neurosurgery, and Psychiatry, 66(4), [phone].  Wilford Sports, N.J.A, [place],  W.J.M, & [place], M.A. (2004.) Assessment of post-stroke quality of life in cost-effectiveness studies: The usefulness of the Barthel Index and the EuroQoL-5D. Quality of Life Research, 13, 427-43         G codes:  In compliance with CMS???s Claims Based Outcome Reporting, the following G-code set was chosen for this patient based on their primary functional limitation being treated:     The outcome measure chosen to determine the severity of the functional limitation was the Barthel Index with a score of 50/100 which was correlated with the impairment scale.      ?? Self Care:              [phone] - CURRENT STATUS:           CK - 40%-59% impaired, limited or restricted              [id number] - GOAL STATUS:                   CI - 1%-19% impaired, limited or restricted              [id number] - D/C STATUS:                       ---------------To be determined---------------      Pain:  Pain Scale 1: Visual  Pain Intensity 1: 0              Activity Tolerance:   No s/s of distress; reports mild dizziness immediately with standing but resolves quickly     Please refer to the flowsheet for vital signs taken during this treatment.  After treatment:    [X]  Patient left in no apparent distress sitting up in chair  [ ]  Patient left in no apparent distress in bed  [X]  Call bell left within reach  [X]  Nursing notified  [ ]  Caregiver present  [ ]  Bed alarm activated      COMMUNICATION/EDUCATION:   The patient???s plan of care was discussed with: Physical Therapist and Registered Nurse.  [X]  Home safety education was provided and the patient/caregiver indicated understanding.  [X]  Patient/family have participated as able in goal setting and plan of care.  [X]  Patient/family agree to work toward stated goals and plan of care.  [ ]  Patient understands intent and goals of therapy, but is neutral about his/her participation.  [ ]  Patient is unable to participate in goal setting and plan of care.  This patient???s plan of care is appropriate for delegation to OTA.     Thank you for this referral.  Lyndal Rainbow, OT  Time Calculation: 12 mins

## 2015-07-26 NOTE — Progress Notes (Signed)
Care Management Interventions    RRAT Score: 13  Initial Assessment: CM reviewed chart and met with patient for discharge planning. Patient had a recent admission in Nov. 2016.  CM verified patient???s address and contact number as correct on the facesheet. Pt presented to ED with c/o having slurred speech.  Patient is currently living with friends.   Patient is retired.  PCP Dr. Radene Knee at [facility] ( [facility]). Patient consented for CM to make appointment arrangements.     Patient has [employer]. Patient will not need assistance with obtaining medications.        Emergency Contact: Zadie Cleverly [phone]    Care Management Interventions  PCP Verified by CM: Yes  Palliative Care Consult (Criteria: CHF and RRAT>21): No  Reason for No Palliative Care Consult: Patient declined palliative services at this time  Mode of Transport at Discharge: Self  Transition of Care Consult (CM Consult): Discharge Planning  MyChart Signup:  (Patient's assigined nurse will address MyChart. )  Discharge Durable Medical Equipment: No  Physical Therapy Consult: No  Occupational Therapy Consult: No  Current Support Network: Other (Patient lives with his friends. )  Confirm Follow Up Transport: Friends  Plan discussed with Pt/Family/Caregiver: Yes  Discharge Location  Discharge Placement: Home with outpatient services   [place] RN CM

## 2015-07-26 NOTE — Procedures (Signed)
[place] [address]   EEG       Name:  Dale Weaver, Dale Weaver   MR#:  [id number]   DOB:  08/27/1950   Account #:  [id number]    Date of Procedure:  07/26/2015   Date of Adm:  07/25/2015       EEG NUMBER: [id number]    REFERRING PHYSICIAN: Dr Billy Fischer.    HISTORY: This is a 64 year old, right-handed, white male, who is   being evaluated for altered mental status.    MEDICATIONS: Not available.    DESCRIPTION OF PROCEDURE: Electrodes were applied using the   paste technique and positioned by [employer] 10-20 system   placement. Recording montages included both referential and bipolar   derivatives. In addition, EEG data, EKG and eye movements were   recorded. Photic stimulation was performed. No hyperventilation.      DESCRIPTION OF THE ACTIVITY: The EEG revealed posterior alpha   rhythm, which was variable between 7 and 11 cycles per second. As   the patient became intermittently drowsy, compatible changes were   noted. Comparison run revealed occasional slowing in the range of 4-5 seconds per second   delta activity. There were  also occasional blunted sharp waves. Photic   stimulation revealed poor biphasic drive without overt epileptic activity,   however, the occasional blunted sharp wave continued, also three was a lot of eye movement artifact   and muscle artifact.  Noted was occasional  left frontotemporal blunted  sharp   wave.     IMPRESSION: Electroencephalogram revealed occasional slowing   with occasional blunted sharp wave which is more lateralized to left frontotemporal   region. This may represent epileptogenic focus. Clinical correlation is required.        Frann Rider, MD      CA / THS   D:  07/26/2015   21:16   T:  07/27/2015   08:49   Job #:  [id number]

## 2015-07-27 ENCOUNTER — Encounter

## 2015-07-27 LAB — CULTURE, URINE
Colonies Counted: 20000
Colony Count: 20000

## 2015-07-27 LAB — METABOLIC PANEL, BASIC
Anion gap: 7 mmol/L (ref 5–15)
BUN/Creatinine ratio: 16 (ref 12–20)
BUN: 15 MG/DL (ref 6–20)
CO2: 29 mmol/L (ref 21–32)
Calcium: 8.1 MG/DL — ABNORMAL LOW (ref 8.5–10.1)
Chloride: 107 mmol/L (ref 97–108)
Creatinine: 0.96 MG/DL (ref 0.70–1.30)
GFR est AA: >60 mL/min/{1.73_m2} (ref >60)
GFR est non-AA: >60 mL/min/{1.73_m2} (ref >60)
Glucose: 88 mg/dL (ref 65–100)
Potassium: 3.7 mmol/L (ref 3.5–5.1)
Sodium: 143 mmol/L (ref 136–145)

## 2015-07-27 MED ORDER — ASPIRIN 81 MG CHEWABLE TAB
81 mg | ORAL_TABLET | Freq: Every day | ORAL | 0 refills | Status: AC
Start: 2015-07-27 — End: 2015-08-26

## 2015-07-27 MED ORDER — SALINE PERIPHERAL FLUSH PRN
INTRAMUSCULAR | Status: DC | PRN
Start: 2015-07-27 — End: 2015-07-27
  Administered 2015-07-27: 21:00:00

## 2015-07-27 MED ORDER — CLOPIDOGREL 75 MG TAB
75 mg | Freq: Every day | ORAL | Status: DC
Start: 2015-07-27 — End: 2015-07-27
  Administered 2015-07-27: 15:00:00 via ORAL

## 2015-07-27 MED ORDER — PRAMIPEXOLE 0.25 MG TAB
0.25 mg | Freq: Three times a day (TID) | ORAL | Status: DC
Start: 2015-07-27 — End: 2015-07-27
  Administered 2015-07-27: 22:00:00 via ORAL

## 2015-07-27 MED ORDER — CLOPIDOGREL 75 MG TAB
75 mg | ORAL_TABLET | Freq: Every day | ORAL | 0 refills | Status: AC
Start: 2015-07-27 — End: 2015-08-26

## 2015-07-27 MED FILL — BD POSIFLUSH NORMAL SALINE 0.9 % INJECTION SYRINGE: INTRAMUSCULAR | Qty: 10

## 2015-07-27 MED FILL — VITAMIN D3 25 MCG (1,000 UNIT) TABLET: 25 mcg (1,000 unit) | ORAL | Qty: 3

## 2015-07-27 MED FILL — CARBIDOPA-LEVODOPA 25 MG-100 MG TAB: 25-100 mg | ORAL | Qty: 1

## 2015-07-27 MED FILL — FLOMAX 0.4 MG CAPSULE: 0.4 mg | ORAL | Qty: 1

## 2015-07-27 MED FILL — GABAPENTIN 300 MG CAP: 300 mg | ORAL | Qty: 1

## 2015-07-27 MED FILL — CHILDREN'S ASPIRIN 81 MG CHEWABLE TABLET: 81 mg | ORAL | Qty: 1

## 2015-07-27 MED FILL — CLOPIDOGREL 75 MG TAB: 75 mg | ORAL | Qty: 1

## 2015-07-27 MED FILL — MIRTAZAPINE 15 MG TAB: 15 mg | ORAL | Qty: 2

## 2015-07-27 MED FILL — PROTONIX 40 MG TABLET,DELAYED RELEASE: 40 mg | ORAL | Qty: 1

## 2015-07-27 MED FILL — ATORVASTATIN 40 MG TAB: 40 mg | ORAL | Qty: 1

## 2015-07-27 MED FILL — ENOXAPARIN 40 MG/0.4 ML SUB-Q SYRINGE: 40 mg/0.4 mL | SUBCUTANEOUS | Qty: 0.4

## 2015-07-27 MED FILL — MIRAPEX 0.25 MG TABLET: 0.25 mg | ORAL | Qty: 1

## 2015-07-27 MED FILL — FLUOXETINE 20 MG CAP: 20 mg | ORAL | Qty: 1

## 2015-07-27 MED FILL — ABILIFY 5 MG TABLET: 5 mg | ORAL | Qty: 3

## 2015-07-27 NOTE — Discharge Summary (Signed)
[place]    Physician Discharge Summary     Patient ID:  Dale Weaver  [id number]  64 y.o.  Aug 08, 1951    Admit date: 07/25/2015    Discharge date and time: 07/27/2015    Admission Diagnoses: TIA  CVA (cerebral vascular accident) [facility])    Discharge Diagnoses:    Principal Problem:    TIA (transient ischemic attack) (07/04/2015)    Active Problems:    CVA (cerebral vascular accident) [facility]) (07/26/2015)         Hospital Course:   Dale Weaver is a 64 y.o. WHITE OR CAUC[place]IAN male with PMX of TIA, CAD status post PCI/stent 2009 and MI in 2012, hypertension, hyperlipidemia, Parkinson's disease who presented to the Emergency Department today complaining of slurred speech.  Patient states that he had an episode of slurred speech today while he was watching TV which lasted for about 10 minutes. There were no associatedsymptoms of weakness, sensory abnormalities, lightheadedness or dizziness. Patient denies any loss of consciousness. The patient speech return to Baseline after the few seconds. Patient states that since morning he's been having intermittent episodes of slurred speech . As per ED physician and my examination the patient was at that baseline,no slurred speech could be appreciated. Patient denies fever, chills, cp, shortness of breath, palpitations. Denies any urinary tract symptoms.      Stroke S was called at 7:17 a.m. Myself and ICU Nurse were present at bedside. Calculated NIHSS was 3.Last time he was found to be @baseline  was at 5:am this morning.  Tele Nuerologist consulted stat. After brief discussion , decision was made for no TPA. ??  Head CT was reviewed which was unremarkable. Decision was made to add Plavix 300 mg loading dose to ASA today followed with 75 mg daily with Aspirin . Stat MRI and MRA head and neck were ordered. Patient speech returned to Baseline after 35 minutes.  ??  ??  TIA (transient ischemic attack) (07/04/2015)POA v/s ?complex partial seizures?  ??  -ASA/Plavix   -High dose statin  PT/OT eval  Neurology already consulted   Await 2D ECHO    Echo enhanced by saline contrast shows a massive space occupying structure in the RA that appears firmly attached to the RA free wall. The foramen ovale may be open since there is disruption of the saline stream coming in to the RA along the septum. Therefore Definity was not used (contraindicated in presence of shunt). Placement on lateral wall makes myxoma unlikely although not impossible. More likely possibilities include thrombus and locally invasive tumor. In a few views the mass appears hollow with an echolucent center, but with thick enough walls so that cyst is unlikely.   ??  After discussion with Dr. Sampson Goon and Dr. Billy Fischer, we agree to discharge on dual antiplatelet therapy with a plan for earliest possible cardiac MRI. This will satisfactorily differentiate between thrombus and tumor, and may shed some light on source.   ??    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    2D ECHO:  RIGHT ATRIUM: Study was primarily performed to assess extent of right  atrial mass and exclude right to left shunt utilizing saline bubble  contrast. Prior to admintration of contrast, there appeared to be  difficult to quantitate linear, irregular, 5-6 x1-1.5 cm echo-dense  possibly calcified mass transecting RA diagonally from medial superior  apex at  corner of IAS to lower lateral base at tricuspid annulus.  Following administration of saline bubbles, there appeared to be a  echo-lucent cap beneath the echo-dense linear mass measuring 2.5 cm at its  thickest down to 1.0 cm at its narrowest point extending from the base of  lateral wall near the lateral TV annulus up superior along the lateral  aspect of RA wall extending around the apex down along the IAS rendering  the right atrial cavity smaller than normal(possible organizing thrombus,   infiltrative tumor,doubtful myxoma). There appears to be no evidence of  right> left shunting. MRI and/or TEE suggested for further clarification  if felt clinically indicated.      PCP: Phys Other, MD     Consults: Cardiology and Neurology    Discharge Exam:  Gen:  No acute distress  Resp:  No accessory muscle use, clear breath sounds without wheezes rales or rhonchi  Card:  No murmurs, normal S1, S2 without thrills, bruits or peripheral edema  Abd:  Soft, non-tender, non-distended, normoactive bowel sounds are present, no palpable organomegaly  Skin:  No rashes or ulcers, skin turgor is good  Neuro:  Cranial nerves 3-12 are grossly intact, grip strength is 5/5 bilaterally, dorsi / plantarflexion strength is 5/5 bilaterally, follows commands appropriately,PARKINSONIAN TREMORS IN LEFT HAND+    Discharge condition: Afrebrile  Ambulating  Eating, Drinking, Voiding  Stable    Disposition: home    Patient Instructions:   Diet: Cardiac Diet   Activity: Activity as tolerated      Wound Care: None needed   Care Plan discussed with: Patient/Family        Follow up   Follow-up Information     Follow up With Details Comments Contact Info    Dr. Radene Knee On 08/01/2015 at 7:30 am with [facility]  [place]   [address] [place] [address]  [phone]  [facility]  Your PCP office will coordinate your neurology follow-up appointment. [place]  [phone]    [facility] on [address] will  [place]  [id number]    Phys Other, MD   Patient can only remember the practice name and not the physician           Discharge Medication List as of 07/27/2015  5:24 PM      START taking these medications    Details   aspirin 81 mg chewable tablet Take 1 Tab by mouth daily for 30 days., Print, Disp-30 Tab, R-0       clopidogrel (PLAVIX) 75 mg tab Take 1 Tab by mouth daily for 30 days., Print, Disp-30 Tab, R-0         CONTINUE these medications which have NOT CHANGED    Details   gabapentin (NEURONTIN) 300 mg capsule Take 600 mg by mouth every eight (8) hours., Historical Med      oxyCODONE-acetaminophen (PERCOCET) 5-325 mg per tablet Take 1 Tab by mouth every six (6) hours as needed for Pain., Historical Med      methocarbamol (ROBAXIN) 750 mg tablet Take 750 mg by mouth every eight (8) hours., Historical Med      carbidopa 25 mg tab Take 50 mg by mouth three (3) times daily., Historical Med      clonazePAM (KLONOPIN) 1 mg tablet Take 0.5 mg by  mouth three (3) times daily., Historical Med      tamsulosin (FLOMAX) 0.4 mg capsule Take 0.4 mg by mouth nightly., Historical Med      miconazole (MICOTIN) 2 % topical powder Apply  to affected area daily. Apply to affected area, Historical Med      ARIPiprazole (ABILIFY) 30 mg tablet Take 15 mg by mouth daily., Historical Med      carbidopa-levodopa (SINEMET) 25-100 mg per tablet Take 1 Tab by mouth three (3) times daily., Historical Med      FLUoxetine (PROZAC) 20 mg tablet Take 20 mg by mouth daily., Historical Med      mirtazapine (REMERON) 30 mg tablet Take 30 mg by mouth nightly., Historical Med      omeprazole (PRILOSEC) 20 mg capsule Take 20 mg by mouth two (2) times a day., Historical Med      cyanocobalamin (VITAMIN B12) 1,000 mcg/mL injection 1,000 mcg by IntraMUSCular route every thirty (30) days., Historical Med      cholecalciferol (VITAMIN D3) 1,000 unit tablet Take 3,000 Units by mouth daily., Historical Med      atorvastatin (LIPITOR) 80 mg tablet Take 40 mg by mouth daily., Historical Med         STOP taking these medications       docusate sodium (COLACE) 100 mg capsule Comments:   Reason for Stopping:                Significant Diagnostic Studies:   No results for input(s): WBC, HGB, HCT, PLT, HGBEXT, HCTEXT, PLTEXT, HGBEXT, HCTEXT, PLTEXT in the last 72 hours.   No results for input(s): NA, K, CL, CO2, BUN, CREA, GLU, CA, MG, PHOS, URICA in the last 72 hours.  No results for input(s): SGOT, GPT, ALT, AP, TBIL, TBILI, TP, ALB, GLOB, GGT, AML, LPSE in the last 72 hours.    No lab exists for component: AMYP, HLPSE  No results for input(s): INR, PTP, APTT in the last 72 hours.    No lab exists for component: INREXT, INREXT   No results for input(s): FE, TIBC, PSAT, FERR in the last 72 hours.   No results for input(s): PH, PCO2, PO2 in the last 72 hours.  No results for input(s): CPK, CKMB in the last 72 hours.    No lab exists for component: TROPONINI  Lab Results   Component Value Date/Time    GLUCOSE (POC) 93 07/26/2015 07:23 AM    GLUCOSE (POC) 140 07/25/2015 03:50 PM    GLUCOSE (POC) 101 12/01/2014 11:10 AM    GLUCOSE (POC) 106 12/01/2014 07:35 AM    GLUCOSE (POC) 157 01/21/2014 11:33 AM          Approximate time spent in patient care on day of discharge: 30 minutes    Signed:  Ree Kida, MD  08/03/2015  5:33 PM

## 2015-07-27 NOTE — Progress Notes (Signed)
Echo enhanced by saline contrast shows a massive space occupying structure in the RA that appears firmly attached to the RA free wall. The foramen ovale may be open since there is disruption of the saline stream coming in to the RA along the septum. Therefore Definity was not used (contraindicated in presence of shunt). Placement on lateral wall makes myxoma unlikely although not impossible. More likely possibilities include thrombus and locally invasive tumor. In a few views the mass appears hollow with an echolucent center, but with thick enough walls so that cyst is unlikely.     After discussion with Dr. Sampson Goon and Dr. Billy Fischer, we agree to discharge on dual antiplatelet therapy with a plan for earliest possible cardiac MRI. This will satisfactorily differentiate between thrombus and tumor, and may shed some light on source.     The patient asked me to explain the plan to his caregiver at [phone]. I left a voice message with my office phone number.     Dale Halsted MD [facility]

## 2015-07-27 NOTE — Progress Notes (Signed)
[facility] PHARMACY STATIN RECOMMENDATIONS  For patients with history of ischemic stroke or transient ischemic attack (TIA).    Statin Therapy Recommendation: continue atorvastatin 40mg  daily     The patient Dale Weaver is a 64 y.o. male.  Clinical ASCVD Group  Less than or equal to 30 years old: High Intensity Statin   StatinTherapy Status: Restarting Previous Statin Therapy   Review of Possible Drug Interactions:    Reasons for not starting statin per MD: None present     Past Medication History  Past Medical History   Diagnosis Date   ??? Back pain      chronic   ??? CAD (coronary artery disease)      MI 2008   ??? Heart failure (HCC)      CHF   ??? Hiatal hernia    ??? Parkinson's disease ([place])    ??? Psychiatric disorder      anxiety   ??? TIA (transient ischemic attack) 01/2014       Lipid Panel  Lab Results   Component Value Date/Time    CHOLESTEROL, TOTAL 94 07/26/2015 04:36 AM    HDL CHOLESTEROL 45 07/26/2015 04:36 AM    LDL, CALCULATED 30.8 07/26/2015 04:36 AM    VLDL, CALCULATED 18.2 07/26/2015 04:36 AM    TRIGLYCERIDE 91 07/26/2015 04:36 AM    CHOL/HDL RATIO 2.1 07/26/2015 04:36 AM       Hepatic Function  Lab Results   Component Value Date/Time    SODIUM 143 07/27/2015 03:44 AM    POTASSIUM 3.7 07/27/2015 03:44 AM    CHLORIDE 107 07/27/2015 03:44 AM    CO2 29 07/27/2015 03:44 AM    ANION GAP 7 07/27/2015 03:44 AM    GLUCOSE 88 07/27/2015 03:44 AM    BUN 15 07/27/2015 03:44 AM    CREATININE 0.96 07/27/2015 03:44 AM    BUN/CREATININE RATIO 16 07/27/2015 03:44 AM    GFR EST AA >60 07/27/2015 03:44 AM    GFR EST NON-AA >60 07/27/2015 03:44 AM    CALCIUM 8.1 07/27/2015 03:44 AM    ALT 35 07/26/2015 07:35 AM    AST 41 07/26/2015 07:35 AM    ALK. PHOSPHATASE 103 07/26/2015 07:35 AM    PROTEIN, TOTAL 5.7 07/26/2015 07:35 AM    ALBUMIN 3.1 07/26/2015 07:35 AM    GLOBULIN 2.6 07/26/2015 07:35 AM    A-G RATIO 1.2 07/26/2015 07:35 AM       Statin Intensity Group High-intensity Moderate-intensity Low-intensity    % LDL-C Reduction ?50% 30 to <50% <30%    Statins and Dosages Atorvastatin 40???80 mg  Rosuvastatin 20-40 mg Atorvastatin 10-20 mg  Rosuvastatin 5-10 mg  Simvastatin 20???40 mg  Pravastatin 40-80 mg  Lovastatin 40 mg  Fluvastatin XL 80 mg  Fluvastatin 40 mg BID  Pitavastatin 2???4 mg Simvastatin 10 mg  Pravastatin 10???20 mg  Lovastatin 20 mg  Fluvastatin 20???40 mg  Pitavastatin 1 mg         Atorvastatin   Autosubtitution for Simvastatin > 40 mg, Pitavastatin > 1 mg, and Rosuvastatin Fluvastatin Lovastatin Pravastatin  Autosubstitution for Fluvastatin, Lovastatin, and Simvastatin < 40 mg Simvastatin Pitavastatin Rosuvastatin    20 mg 10 mg 10 mg 5 mg     10 mg 40 mg 20 mg 20 mg 10 mg 1 mg    10 mg 80 mg 40 mg 40 mg 20 mg 2 mg 5 mg   20 mg  80 mg 80 mg 40 mg 4 mg 10 mg  40 mg    80 mg  20 mg   80 mg      40 mg

## 2015-07-27 NOTE — Progress Notes (Signed)
Problem: Mobility Impaired (Adult and Pediatric)  Goal: *Acute Goals and Plan of Care (Insert Text)  Physical Therapy Goals  Initiated 07/26/2015  1. Patient will move from supine to sit and sit to supine , scoot up and down and roll side to side in bed with independence within 7 day(s).   2. Patient will transfer from bed to chair and chair to bed with modified independence using the least restrictive device within 7 day(s).  3. Patient will perform sit to stand with modified independence within 7 day(s).  4. Patient will ambulate with modified independence for 300 feet with the least restrictive device within 7 day(s).    PHYSICAL THERAPY TREATMENT  Patient: Dale Weaver [id number] y.o. male)  Date: 07/27/2015  Diagnosis: TIA  CVA (cerebral vascular accident) [facility]) TIA (transient ischemic attack)         Precautions: Fall      ASSESSMENT:  Patient presents with TIA, and a past medical history of Parkinson's disease. Patient demonstrates improvements with overall functional mobility this date. Patient required verbal cueing to maintain upright posture as well as increase stride length for energy conservation.   Progression toward goals:  [X]     Improving appropriately and progressing toward goals  [ ]     Improving slowly and progressing toward goals  [ ]     Not making progress toward goals and plan of care will be adjusted       PLAN:  Patient continues to benefit from skilled intervention to address the above impairments.  Continue treatment per established plan of care.  Discharge Recommendations:  Home Health or Outpatient  Further Equipment Recommendations for Discharge:  Patient states that he has rollator walker and cane at home       SUBJECTIVE:   Patient stated ???I feel much better today.???      OBJECTIVE DATA SUMMARY:   Critical Behavior:  Neurologic State: Alert  Orientation Level: Oriented X4  Cognition: Appropriate decision making, Appropriate for age  attention/concentration, Follows commands, Appropriate safety awareness  Safety/Judgement: Awareness of environment, Insight into deficits, Good awareness of safety precautions  Functional Mobility Training:  Bed Mobility:     Supine to Sit: Modified independent (has hospital bed at home -- set-up similar with HOB elevated)     Scooting: Independent        Transfers:  Sit to Stand: Stand-by asssistance  Stand to Sit: Supervision        Bed to Chair: Modified independent (using rolling walker)                    Balance:  Sitting: Intact  Standing: Intact  Standing - Static: Good  Standing - Dynamic : Fair  Ambulation/Gait Training:  Distance (ft): 300 Feet (ft)  Assistive Device: Walker, rolling  Ambulation - Level of Assistance: Contact guard assistance        Gait Abnormalities: Shuffling gait        Base of Support: Narrowed     Speed/Cadence: Pace decreased (<100 feet/min)         Patient demonstrates kyphotic posture and decreased stride length with ambulation. Patient required verbal cueing to maintain upright posture and increase stride length for energy conservation. Patient demonstrates no loss of balance during ambulation. No shortness of breath or increased pain noted with ambulation. Assist of one.            Therapeutic Exercises:   Patient performed standing toe raises, mini squats, and hip flexion for 20  reps bilaterally.      Neuromuscular Reeducation:  Patient performed NBOS, EO without UE support for >30 seconds; no increased sway and close supervision. NBOS, EC without UE support for > 30 seconds; mild increased sway and close supervision. Patient able to accept perturbations in all directions with NBOS and EO/EC without UE support and with increased sway; patient exhibited one mild loss of balance requiring contact guard assist to recover.      Pain:  Pain Scale 1: Numeric (0 - 10)  Pain Intensity 1: 0  Pain Location 1: Back  Pain Orientation 1: Lower  Pain Description 1: Aching   Pain Intervention(s) 1: Declines      Activity Tolerance:   Good post ambulation and exercises.     Please refer to the flowsheet for vital signs taken during this treatment.  After treatment:   [X]     Patient left in no apparent distress sitting up in chair  [ ]     Patient left in no apparent distress in bed  [X]     Call bell left within reach  [X]     Nursing notified  [ ]     Caregiver present  [ ]     Bed alarm activated      COMMUNICATION/COLLABORATION:   The patient???s plan of care was discussed with: Physical Therapist and Registered Nurse     Noah Charon, PT   Time Calculation: 25 mins

## 2015-07-27 NOTE — Progress Notes (Signed)
Spoke with Dr Sampson Goon, noting that he is requesting agitated saline echo enhanced limited echo for this pt. Noted per Dr Sampson Goon to D Turner echo tech.

## 2015-07-27 NOTE — Progress Notes (Signed)
This is an outpatient order set for Dale Weaver who is presently admitted to [facility] after TIA. He has a mass in the right atrium with differential diagnosis including tumor or clot. The Foramen ovale may be open; the saline contrast stream in the RA seems disrupted by a stream of contrast-free blood flow adjacent to the atrial septum. Because of this, Definity was not employed. We agree to treat with dual antiplatelet therapy and obtain ambulatory cardiac MRI as soon as possible. This should help differentiate between clot and tumor. If the mass represents tumor, it seems firmly attached to the RA free wall rather than the septum. Given no septal involvement, age and gender, myxoma seems unlikely. Possibilities include direct invasion from adjacent lung neoplasm, or something migrating in from the great veins. We will follow in office as soon as Dale is done with possible cardiac surgical consultation.    Rayford Halsted MD

## 2015-07-27 NOTE — Progress Notes (Signed)
Neurology Progress Note    NAME:  Dale Weaver   DOB:   February 18, 1951   MRN:   [id number]     Date/Time:  07/27/2015 4:12 PM  Subjective:       No new complaints.  Review of Systems:  Y  N       Y  N     Fever/chills                                                  Chest Pain     Cough                                                          Diarrhea      Sputum                                                        Constipation     SOB/DOE                                                   Nausea/Vomit     Abd Pain                                                       Tolerating PT     Dysuria                                                         Tolerating Diet     Unable to obtain  ROS due to  mental status change  sedated   intubated    Medications reviewed:  Current Facility-Administered Medications   Medication Dose Route Frequency   ??? saline peripheral flush soln 10 mL  10 mL InterCATHeter PRN   ??? pramipexole (MIRAPEX) tablet 0.125 mg  0.125 mg Oral TID   ??? LORazepam (ATIVAN) injection 1 mg  1 mg IntraVENous Q6H PRN   ??? carbidopa-levodopa (SINEMET) 25-100 mg per tablet 1 Tab  1 Tab Oral TID   ??? cholecalciferol (VITAMIN D3) tablet 3,000 Units  3,000 Units Oral DAILY   ??? mirtazapine (REMERON) tablet 30 mg  30 mg Oral QHS   ??? gabapentin (NEURONTIN) capsule 300 mg  300 mg Oral TID   ??? oxyCODONE-acetaminophen (PERCOCET) 5-325 mg per tablet 1 Tab  1 Tab Oral Q6H PRN   ??? tamsulosin (FLOMAX) capsule 0.4 mg  0.4 mg Oral QHS   ??? sodium chloride (NS) flush 5-10 mL  5-10 mL IntraVENous Q8H   ???  sodium chloride (NS) flush 5-10 mL  5-10 mL IntraVENous PRN   ??? aspirin chewable tablet 81 mg  81 mg Oral DAILY   ??? enoxaparin (LOVENOX) injection 40 mg  40 mg SubCUTAneous Q24H   ??? ARIPiprazole (ABILIFY) tablet 15 mg  15 mg Oral DAILY   ??? atorvastatin (LIPITOR) tablet 40 mg  40 mg Oral DAILY   ??? [START ON 08/03/2015] cyanocobalamin (VITAMIN B12) injection 1,000 mcg  1,000 mcg IntraMUSCular Q30D    ??? docusate sodium (COLACE) capsule 100 mg  100 mg Oral BID PRN   ??? FLUoxetine (PROzac) capsule 20 mg  20 mg Oral DAILY   ??? pantoprazole (PROTONIX) tablet 40 mg  40 mg Oral ACB        Objective:   Vitals:  Visit Vitals   ??? BP 142/86 (BP 1 Location: Right arm, BP Patient Position: At rest)   ??? Pulse 76   ??? Temp 97.9 ??F (36.6 ??C)   ??? Resp 18   ??? Ht 6\' 1"  (1.854 m)   ??? Wt 97.4 kg (214 lb 11.2 oz)   ??? SpO2 95%   ??? BMI 28.33 kg/m2     Temp (24hrs), Avg:97.6 ??F (36.4 ??C), Min:97.2 ??F (36.2 ??C), Max:97.9 ??F (36.6 ??C)      O2 Device: Room air  EEG-Slowing with occasional blunted sharp wave.    PHYSICAL EXAM:  General:    Alert, cooperative, no distress, appears stated age.     Head:   Normocephalic, without obvious abnormality, atraumatic.  Eyes:   Conjunctivae/corneas clear.  PERRLA  Nose:  Nares normal. No drainage or sinus tenderness.  Throat:    Lips, mucosa, and tongue normal.  No Thrush  Neck:  Supple, symmetrical,  no adenopathy, thyroid: non tender    no carotid bruit and no JVD.  Back:    Symmetric,  No CVA tenderness.  Lungs:   Clear to auscultation bilaterally.  No Wheezing or Rhonchi. No rales.  Chest wall:  No tenderness or deformity. No Accessory muscle use.  Heart:   Regular rate and rhythm,  no murmur, rub or gallop.  Abdomen:   Soft, non-tender. Not distended.  Bowel sounds normal. No masses  Extremities: Extremities normal, atraumatic, No cyanosis.  No edema. No clubbing  Skin:     Texture, turgor normal. No rashes or lesions.  Not Jaundiced  Lymph nodes: Cervical, supraclavicular normal.  Psych:  Good insight.  Not depressed.  Not anxious or agitated.    NEUROLOGICAL EXAM:  Appearance:  The patient is well developed, well nourished,  and is in no acute distress.   Mental Status: Oriented to time, place and person. Masklike facies.   Cranial Nerves:   Intact visual fields. Fundi are benign. PERLA, EOM's full, no nystagmus, no ptosis. Facial sensation is normal. Corneal  reflexes are intact. Facial movement is symmetric. Hearing is normal bilaterally. Palate is midline with normal sternocleidomastoid and trapezius muscles are normal. Tongue is midline.   Motor:  5/5 strength in upper and lower proximal and distal muscles. Normal bulk and  Increased tone. No fasciculations.   Reflexes:   Deep tendon reflexes 2+/4 and symmetrical.   Sensory:   Equivocal sensation to touch, pinprick and vibration.   Gait:  Unsteady gait.Shufflig gait   Tremor:   Tremor noted.   Cerebellar:  No cerebellar signs present.   Neurovascular:  Normal heart sounds and regular rhythm, peripheral pulses intact, and no carotid bruits.  Lab Data Reviewed:    Recent Results (from the past 24 hour(s))   METABOLIC PANEL, BASIC    Collection Time: 07/27/15  3:44 AM   Result Value Ref Range    Sodium 143 136 - 145 mmol/L    Potassium 3.7 3.5 - 5.1 mmol/L    Chloride 107 97 - 108 mmol/L    CO2 29 21 - 32 mmol/L    Anion gap 7 5 - 15 mmol/L    Glucose 88 65 - 100 mg/dL    BUN 15 6 - 20 MG/DL    Creatinine 0.96 0.70 - 1.30 MG/DL    BUN/Creatinine ratio 16 12 - 20      GFR est AA >60 >60 ml/min/1.21m2    GFR est non-AA >60 >60 ml/min/1.71m2    Calcium 8.1 (L) 8.5 - 10.1 MG/DL       Assesment  Principal Problem:    TIA (transient ischemic attack) (07/04/2015)    Active Problems:    CVA (cerebral vascular accident) ([place]) (07/26/2015)    Tremors  Complex partial seizure  PD  ___________________________________________________  PLAN:    1. Mirapex .125mg  p.o.tid  2. D/c Plavix     ___________________________________________________    Total time spent with patient:  15   25   35    __ minutes    Critical Care Provided    Care Plan discussed with:    Patient   Family    Care Manager   Consultant/Specialist :    ___________________________________________________    Attending Physician: Nelwyn Salisbury, MD

## 2015-07-27 NOTE — Procedures (Signed)
[place] [address]   EEG       Name:  Dale Weaver, Dale Weaver   MR#:  [id number]   DOB:  12/20/1950   Account #:  [id number]    Date of Procedure:  07/26/2015   Date of Adm:  07/25/2015       EEG NUMBER: [id number]    REFERRING PHYSICIAN: Dr Billy Fischer.    HISTORY: This is a 64 year old, right-handed, white male, who is   being evaluated for altered mental status.    MEDICATIONS: Not available.    DESCRIPTION OF PROCEDURE: Electrodes were applied using the   paste technique and positioned by [employer] 10-20 system   placement. Recording montages included both referential and bipolar   derivatives. In addition, EEG data, EKG and eye movements were   recorded. Photic stimulation was performed. No hyperventilation.      DESCRIPTION OF THE ACTIVITY: The EEG revealed posterior alpha   rhythm, which was variable between 7 and 11 cycles per second. As   the patient became intermittently drowsy, compatible changes were   noted. Comparison run revealed occasional slowing in the range of 4-5 seconds per second   delta activity. There were  also occasional blunted sharp waves. Photic   stimulation revealed poor biphasic drive without overt epileptic activity,   however, the occasional blunted sharp wave continued, also three was a lot of eye movement artifact   and muscle artifact.  Noted was occasional  left frontotemporal blunted  sharp   wave.     IMPRESSION: Electroencephalogram revealed occasional slowing   with occasional blunted sharp wave which is more lateralized to left frontotemporal   region. This may represent epileptogenic focus. Clinical correlation is required.        Frann Rider, MD      CA / THS   D:  07/26/2015   21:16   T:  07/27/2015   08:49   Job #:  [id number]

## 2015-07-27 NOTE — Progress Notes (Signed)
Problem: Self Care Deficits Care Plan (Adult)  Goal: *Acute Goals and Plan of Care (Insert Text)  Occupational Therapy Goals  Initiated 07/26/2015  1. Patient will perform grooming standing at the sink with supervision/set-up within 7 day(s).  2. Patient will perform walk in toilet transfers with supervision/set-up within 7 day(s).  3. Patient will participate in R upper extremity therapeutic exercise/activities with supervision/set-up within 7 day(s).   4. Patient will utilize fall prevention techniques during functional activities with verbal cues within 7 day(s).     OCCUPATIONAL THERAPY TREATMENT  Patient: Dale Weaver [id number] y.o. male)  Date: 07/27/2015  Diagnosis: TIA  CVA (cerebral vascular accident) ([place]) TIA (transient ischemic attack)         Precautions: Fall      ASSESSMENT:  Pt presents near (?at) baseline level - patient reporting that he feels back to normal - no longer with numbness/tingling R side and equal BUE ROM/strength compared to differences noted yesterday.   Pt demonstrated independence to mod I with in room and bathroom mobility - obtained clothing from various surfaces including floor and donned pants/socks/shoes independently.   Pt to discharge back to group home today with live in assist that he received previously.  Pt may benefit from outpatient movement clinic or neuro clinic with re: to Parkinson's. Recommendation made to patient and he stated understanding. Resources re: two available Parkinson's support groups also issued to patient in writing.   Progression toward goals:  [X]        Improving appropriately and progressing toward goals  [ ]        Improving slowly and progressing toward goals  [ ]        Not making progress toward goals and plan of care will be adjusted       PLAN:  Patient continues to benefit from skilled intervention to address the above impairments.  Continue treatment per established plan of care.   Discharge Recommendations: outpatient PT/OT movement clinic or neuro clinic re: Parkinson's; Parkinson's support group (support group info/numbers issued to patient)  Further Equipment Recommendations for Discharge:  None (has RW, rollator, tub bench)       SUBJECTIVE:   Patient stated ???I'm going home today."      OBJECTIVE DATA SUMMARY:   Cognitive/Behavioral Status:  Neurologic State: Alert  Orientation Level: Oriented X4  Cognition: Appropriate decision making;Appropriate for age attention/concentration;Follows commands;Appropriate safety awareness  Perception: Appears intact  Perseveration: No perseveration noted  Safety/Judgement: Awareness of environment;Insight into deficits;Good awareness of safety precautions     Functional Mobility and Transfers for ADLs:  Bed Mobility:  Supine to Sit: Modified independent (has hospital bed at home -- set-up similar with HOB elevated)  Scooting: Independent     Transfers:  Sit to Stand: Independent  Bed to Chair: Modified independent (using rolling walker)  Toilet Transfer : Modified independent  Shower Transfer: Modified independent (uses tub bench at home --)     Balance:  Sitting: Intact  Standing: Impaired  Standing - Static: Good  Standing - Dynamic : Fair (using rolling walker during bathroom mobility, though obtaining clothing from drawer/shoes from floor without device)     ADL Intervention:    Pt received in bed - performed bed mobility simulating home set-up with mod I; ambulated about room and bathroom with mod I using RW in room - ambulating slowly in bathroom without RW and no loss of balance. Pt obtained clothing from standing position from bedside table and from floor to dress as follows:  Lower Body Dressing Assistance  Pants With Button/Zipper: Independent (standing and sitting as needed to don)  Socks: Independent (seated edge of bed)  Shoes with Cloth Laces: Independent (seated edge of bed)  Position Performed: Seated edge of bed;Standing      Toileting  Toileting Assistance: Modified independent     Pain:  Pain Scale 1: Numeric (0 - 10)  Pain Intensity 1: 0  Pain Location 1: Back  Pain Orientation 1: Lower  Pain Description 1: Aching  Pain Intervention(s) 1: Declines  Activity Tolerance:   No signs/symptoms of distress throughout session.  Please refer to the flowsheet for vital signs taken during this treatment.  After treatment:   [X]  Patient left in no apparent distress sitting up in chair  [ ]  Patient left in no apparent distress in bed  [X]  Call bell left within reach  [X]  Nursing notified  [ ]  Caregiver present  [ ]  Bed alarm activated      COMMUNICATION/COLLABORATION:   The patient???s plan of care was discussed with: RN, PT     Higinio Roger V, OTR/L  Time Calculation: 23 mins

## 2015-07-27 NOTE — Consults (Signed)
Consult    Patient: Dale Weaver MRN: [id number]  SSN: [id number]    Date of Birth: [birth year]  Age: 64 y.o.  Sex: male       Subjective:      Date of  Admission: 07/25/2015     Admission type: Emergency    Dale Weaver is a 64 y.o. male with rich medical hx including CVA, MI, PCI with stent placement, HTN, dyslipidemia, Parkinson's disease, and affective personality disorder has been admitted for TIA  CVA (cerebral vascular accident) ([place]). Patient presented to ER on 07/25/15 c/o intermittent slurring of words during that day, which had resolved upon presentation. During his work up in the hospital pt was found to have an organized mass in right atrium with possible patent foramen ovale on TTE. Cardiology was then consulted by Hospitalist, Dr. Billy Fischer. Pt currently complains of intermittent SOB, mostly with exertion but occasionally at rest.   Patient's symptoms last approximately 3 minutes, and are intermittent episodes occurring several times an hour, per pt. He describes the symptoms as difficulty gaining a full breath. There is no associated chest pain, edema, palpitations, abdominal pain or PND/orthopnea. Pt cannot identify any alleviating or aggravating factors. Previous treatment/evaluation includes Lexiscan stress, during his last hospitalization on 07/04/15, which was negative for coronary disease. He was diagnosed with pleurisy at that time. Cardiac risk factors: dyslipidemia, male gender, hypertension.    Primary Care Provider: Phys Other, MD  Past Medical History   Diagnosis Date   ??? Back pain      chronic   ??? CAD (coronary artery disease)      MI 2008   ??? Heart failure (HCC)      CHF   ??? Hiatal hernia    ??? Parkinson's disease ([place])    ??? Psychiatric disorder      anxiety   ??? TIA (transient ischemic attack) 01/2014      Past Surgical History   Procedure Laterality Date   ??? Hx orthopaedic       back surgery x4, last June 2010   ??? Hx appendectomy     ??? Hx cholecystectomy     ??? Hx hernia repair      ??? Pr cardiac surg procedure unlist       cardiac cath August 2009   ??? Pr cardiac surg procedure unlist  2010     x 1 [facility]   ??? Pr cardiac surg procedure unlist       insertion of loop recorder removed 06/2014     Family History   Problem Relation Age of Onset   ??? Stroke Father    ??? Hypertension Mother       Social History   Substance Use Topics   ??? Smoking status: Never Smoker   ??? Smokeless tobacco: Not on file   ??? Alcohol use No      Current Facility-Administered Medications   Medication Dose Route Frequency   ??? clopidogrel (PLAVIX) tablet 75 mg  75 mg Oral DAILY   ??? saline peripheral flush soln 10 mL  10 mL InterCATHeter PRN   ??? LORazepam (ATIVAN) injection 1 mg  1 mg IntraVENous Q6H PRN   ??? carbidopa-levodopa (SINEMET) 25-100 mg per tablet 1 Tab  1 Tab Oral TID   ??? cholecalciferol (VITAMIN D3) tablet 3,000 Units  3,000 Units Oral DAILY   ??? mirtazapine (REMERON) tablet 30 mg  30 mg Oral QHS   ??? gabapentin (NEURONTIN) capsule 300 mg  300 mg Oral TID   ??? oxyCODONE-acetaminophen (PERCOCET) 5-325 mg per tablet 1 Tab  1 Tab Oral Q6H PRN   ??? tamsulosin (FLOMAX) capsule 0.4 mg  0.4 mg Oral QHS   ??? sodium chloride (NS) flush 5-10 mL  5-10 mL IntraVENous Q8H   ??? sodium chloride (NS) flush 5-10 mL  5-10 mL IntraVENous PRN   ??? aspirin chewable tablet 81 mg  81 mg Oral DAILY   ??? enoxaparin (LOVENOX) injection 40 mg  40 mg SubCUTAneous Q24H   ??? ARIPiprazole (ABILIFY) tablet 15 mg  15 mg Oral DAILY   ??? atorvastatin (LIPITOR) tablet 40 mg  40 mg Oral DAILY   ??? [START ON 08/03/2015] cyanocobalamin (VITAMIN B12) injection 1,000 mcg  1,000 mcg IntraMUSCular Q30D   ??? docusate sodium (COLACE) capsule 100 mg  100 mg Oral BID PRN   ??? FLUoxetine (PROzac) capsule 20 mg  20 mg Oral DAILY   ??? pantoprazole (PROTONIX) tablet 40 mg  40 mg Oral ACB        Allergies   Allergen Reactions   ??? Seafood [Shellfish Containing Products] Hives     Allergy to fish only.  Can eat shrimp.  No allergy to IV contrast.    ??? Sulfa (Sulfonamide Antibiotics) Hives   ??? Zoloft [Sertraline] Itching        Review of Systems:  A comprehensive review of systems was negative except for that written in the History of Present Illness.       Subjective:     Visit Vitals   ??? BP 142/86 (BP 1 Location: Right arm, BP Patient Position: At rest)   ??? Pulse 76   ??? Temp 97.9 ??F (36.6 ??C)   ??? Resp 18   ??? Ht 6\' 1"  (1.854 m)   ??? Wt 214 lb 11.2 oz (97.4 kg)   ??? SpO2 95%   ??? BMI 28.33 kg/m2        Physical Exam:  Neck: supple, symmetrical, trachea midline, no adenopathy, thyroid: not enlarged, symmetric, no tenderness/mass/nodules, no carotid bruit and no JVD  Heart: regular rate and rhythm, S1, S2 normal, no murmur, click, rub or gallop  Lungs: clear to auscultation bilaterally, normal excursion, no chest wall TTP  Abdomen: soft, non-tender. Bowel sounds normal. No masses,  no organomegaly  Extremities: extremities normal, atraumatic, no cyanosis or edema  Pulses: 2+ and symmetric  Neurologic: Alert and oriented X 3, normal strength and tone. Normal symmetric reflexes. Normal coordination and gait  Gen: WDWN adult male with Parkinsonian affect; resting tremor of left hand. NAD.    Cardiographics:  Telemetry: NSR    ECG: normal EKG, normal sinus rhythm, poor R wave progression    Echocardiogram: SUMMARY:  Left ventricle: Size was normal. Ejection fraction was estimated in the  range of 65 % to 70 %. There were no regional wall motion abnormalities.  Wall thickness was at the upper limits of normal. Doppler parameters were  consistent with abnormal left ventricular relaxation (grade 1 diastolic  dysfunction).    Left atrium: The atrium was mildly dilated.    Atrial septum: The septum bows from right to left, consistent with  increased right atrial pressure.    Right atrium: There was a probable, 2x1 cm, large, pedunculated,  echo-dense mass within RA chamber adjacent to superolateral wall. Cannot   r/o 2 attachment points at junction corner of superior IAS and upper RA  wall and lower mid IAS. Cannot exclude an extrinsic mass with shadowing.  MRI and/or TEE recommended if felt clinically indicated. Bubble contrast  study suggested to exclude right > left IAS shunting.    Aortic valve: There was mild regurgitation.    Tricuspid valve: There was mild regurgitation. Pulmonary artery systolic  pressure: 35 mmHg. There was mild pulmonary hypertension.    Pulmonic valve: There was trivial regurgitation.    Aorta, systemic arteries: The root exhibited mild dilatation.    CT ABD/PELVIS:  FINDINGS:  ??  There is mild elevation of the right hemidiaphragm. There is mild right basilar  patchy and linear airspace disease. There is no subdiaphragmatic free  intraperitoneal gas.  ??  Abdomen: The gallbladder is surgically absent. The liver, spleen, adrenals,  pancreas are normal on noncontrast images. The kidneys are mildly atrophic.  There is a 3.6 cm right renal cyst. No renal, ureteral bladder calculus is  visualized. There is no perinephric stranding, hydronephrosis or hydroureter. No  thickened or dilated loop of large or small bowel seen. There is no free  intraperitoneal gas or fluid.  ??  Pelvis: Urinary bladder is partially filled and grossly normal. Scattered  colonic diverticulosis is noted; there is no diverticulitis. There is no free  fluid in the pelvis. There is no focal fluid collection to suggest abscess.  ??  IMPRESSION  IMPRESSION: ??  1. No free intraperitoneal gas.  2. Mild right basilar patchy and linear airspace disease    Data Reviewed:  All recent labs reviewed.  CMP:   Lab Results   Component Value Date/Time    NA 143 07/31/15 03:44 AM    K 3.7 2015-07-31 03:44 AM    CL 107 07-31-15 03:44 AM    CO2 29 2015-07-31 03:44 AM    AGAP 7 07-31-2015 03:44 AM    GLU 88 31-Jul-2015 03:44 AM    BUN 15 31-Jul-2015 03:44 AM    CREA 0.96 Jul 31, 2015 03:44 AM    GFRAA >60 2015/07/31 03:44 AM     GFRNA >60 2015-07-31 03:44 AM    CA 8.1 (L) 07/31/15 03:44 AM     CBC: No results found for: WBC, HGB, HGBEXT, HCT, HCTEXT, PLT, PLTEXT, HGBEXT, HCTEXT, PLTEXT  All Cardiac Markers in the last 24 hours: No results found for: CPK, CKMMB, CKMB, RCK3, CKMBT, CKNDX, CKND1, MYO, TROPT, TROIQ, TROI, TROPT, TNIPOC, BNP, BNPP     Assessment:         Hospital Problems  Date Reviewed: 31-Jul-2015          Codes Class Noted POA    CVA (cerebral vascular accident) [facility]) ICD-10-CM: I63.9  ICD-9-CM: 434.91  07/26/2015 Unknown        * (Principal)TIA (transient ischemic attack) ICD-10-CM: G45.9  ICD-9-CM: 435.9  07/04/2015 Yes            Hemodynamically stable 64 yo male with TIA likely a/w organized mass found in right atrium on echocardiogram. This is a new finding when compared with Echo of 2015. Repeat contrast study is pending to evaluate for right to left shunt and possible PFO. Pt will need further imaging to delineate the mass, either TEE or Cardiac MRI. Pt had CT abd during last hospitalization that showed no liver lesions. There is no Chest CT. It is unclear what the origin of this pedunculated mass is in pt's right atrium. In light of pt's Parkinsonian history and thus increased risk for falls, agree with ASA and Plavix for initial anticoagulation. Pt will need follow up but acuity and destination of this follow up  is yet to be determined.     Plan:   Discussed with Drs. Claiborne Billings and [place]    CVA/TIA   -- Likely cardiogenic cause   -- [place] Contrast Echo study   -- Cardiac MRI or TEE recommended   -- Surgical consult, yet to be determined, pending studies    SOB   -- monitor   -- Consider CT Chest     HTN   -- Continue current regimen    Signed By: Lebron Conners, PA-C     July 27, 2015

## 2015-07-27 NOTE — Progress Notes (Signed)
agitated saline study for echo enhancement.echo study completed

## 2015-07-27 NOTE — Progress Notes (Signed)
PCP Verified by CM:  Yes   Transition of Care Consult (CM Consult):  Discharge Planning    Physical Therapy Consult:  yes   Occupational Therapy Consult:  yes   Speech Therapy Consult:  yes   Discharge Durable Medical Equipment:  no  DME company / equipment: NA  Current Support Network  Lives with his caregiver and several others    Confirm Follow -Up Transport    Discharge Transportation : Mr. Jerline Pain with [facility] discussed with Pt/Family/Caregiver   yes   Discharge Placement : return home  Home with outpatient services   SNF  Name / Address/ ph: NA  Home Health  Name /address/ph: [place] Referral: No          Time and date sent: NA  [facility] Referral: Yes  CM scheduled follow-up appointment with [facility] on 08/01/15 at 7:30am. Patient will also follow-up with his neurologist at the [facility].    Care Management Interventions  PCP Verified by CM: Yes  Palliative Care Consult (Criteria: CHF and RRAT>21): No  Reason for No Palliative Care Consult: Patient declined palliative services at this time  Mode of Transport at Discharge: Self  Transition of Care Consult (CM Consult): Discharge Planning  MyChart Signup:  (Patient's assigined nurse will address MyChart. )  Discharge Durable Medical Equipment: No  Physical Therapy Consult: No  Occupational Therapy Consult: No  Current Support Network: Other (Patient lives with his friends. )  Confirm Follow Up Transport: Friends  Plan discussed with Pt/Family/Caregiver: Yes  Discharge Location  Discharge Placement: Home with outpatient services    Veneda Melter, MSW, Supervisee in Social Work  [phone]

## 2015-07-27 NOTE — Other (Signed)
CM rounded with IDT and discussed patient's care. CM assisted with discharge planning and patient will discharge home today. CM will coordinate transportation with [employer] transportation at discharge. CM will collaborate with [facility] to arrange patient's follow-up neurology appointment at discharge.    Veneda Melter, MSW, Supervisee in Social Work  [phone]

## 2015-07-27 NOTE — Progress Notes (Addendum)
0710hrs .Marland KitchenBedside and Verbal shift change report given to [employer] developer) by Soyla Murphy (offgoing nurse). Report included the following information SBAR, Kardex, Intake/Output, MAR, Recent Results, Med Rec Status and Cardiac Rhythm NSR.  Patient had no complaint of new weakness, no slurred speech, no complaint of pain.    1800hrs Discharge instructions was discussed with patient, opportunity for questions were provided. IV access was removed.     1820hrs Discharge patient to home via taxi. Patient was alert and oriented, no complaint of any pain or distress.

## 2015-07-27 NOTE — Progress Notes (Signed)
Echocardiogram 2D limited adult completed as ordered. Agitated Saline given for image enhancement.  Procedure explained.  Full report to follow.

## 2015-07-28 NOTE — Telephone Encounter (Signed)
Care manager called patient to follow up; unable to reach patient, number is temporarily out of service. Will retry tomorrow per CM protocol.    Nicolasa Ducking, MSW  [phone]

## 2015-07-29 NOTE — Telephone Encounter (Signed)
Care manager called patient to follow up. Unable to reach patient, phone number is out of service. This was second attempt to reach the patient.    Nicolasa Ducking, MSW  [phone]

## 2015-09-14 ENCOUNTER — Ambulatory Visit: Payer: Self-pay | Primary: Family Medicine

## 2015-09-28 ENCOUNTER — Inpatient Hospital Stay: Payer: MEDICARE | Attending: Cardiovascular Disease | Primary: Family Medicine

## 2015-10-24 ENCOUNTER — Inpatient Hospital Stay: Payer: MEDICARE | Attending: Cardiovascular Disease | Primary: Family Medicine

## 2015-10-30 ENCOUNTER — Emergency Department: Admit: 2015-10-30 | Payer: MEDICARE | Primary: Family Medicine

## 2015-10-30 ENCOUNTER — Inpatient Hospital Stay
Admit: 2015-10-30 | Discharge: 2015-10-31 | Disposition: A | Discharge status: Home / Self Care | Payer: MEDICARE | Attending: Emergency Medicine

## 2015-10-30 DIAGNOSIS — R0602 Shortness of breath: Secondary | ICD-10-CM

## 2015-10-30 LAB — METABOLIC PANEL, COMPREHENSIVE
A-G Ratio: 1.5 (ref 1.1–2.2)
ALT (SGPT): 20 U/L (ref 12–78)
AST (SGOT): 62 U/L — ABNORMAL HIGH (ref 15–37)
Albumin: 3.4 g/dL — ABNORMAL LOW (ref 3.5–5.0)
Alk. phosphatase: 131 U/L — ABNORMAL HIGH (ref 45–117)
Anion gap: 9 mmol/L (ref 5–15)
BUN/Creatinine ratio: 27 — ABNORMAL HIGH (ref 12–20)
BUN: 25 MG/DL — ABNORMAL HIGH (ref 6–20)
Bilirubin, total: 0.4 MG/DL (ref 0.2–1.0)
CO2: 26 mmol/L (ref 21–32)
Calcium: 8.1 MG/DL — ABNORMAL LOW (ref 8.5–10.1)
Chloride: 112 mmol/L — ABNORMAL HIGH (ref 97–108)
Creatinine: 0.92 MG/DL (ref 0.70–1.30)
GFR est AA: >60 mL/min/{1.73_m2} (ref >60)
GFR est non-AA: >60 mL/min/{1.73_m2} (ref >60)
Globulin: 2.3 g/dL (ref 2.0–4.0)
Glucose: 117 mg/dL — ABNORMAL HIGH (ref 65–100)
Potassium: 4.2 mmol/L (ref 3.5–5.1)
Protein, total: 5.7 g/dL — ABNORMAL LOW (ref 6.4–8.2)
Sodium: 147 mmol/L — ABNORMAL HIGH (ref 136–145)

## 2015-10-30 LAB — CBC WITH AUTOMATED DIFF
ABS. BASOPHILS: 0 10*3/uL (ref 0.0–0.1)
ABS. EOSINOPHILS: 0.1 10*3/uL (ref 0.0–0.4)
ABS. LYMPHOCYTES: 1 10*3/uL (ref 0.8–3.5)
ABS. MONOCYTES: 0.4 10*3/uL (ref 0.0–1.0)
ABS. NEUTROPHILS: 3 10*3/uL (ref 1.8–8.0)
BASOPHILS: 0 % (ref 0–1)
EOSINOPHILS: 2 % (ref 0–7)
HCT: 40.8 % (ref 36.6–50.3)
HGB: 13.5 g/dL (ref 12.1–17.0)
LYMPHOCYTES: 21 % (ref 12–49)
MCH: 32.6 PG (ref 26.0–34.0)
MCHC: 33.1 g/dL (ref 30.0–36.5)
MCV: 98.6 FL (ref 80.0–99.0)
MONOCYTES: 9 % (ref 5–13)
NEUTROPHILS: 68 % (ref 32–75)
PLATELET: 99 10*3/uL — ABNORMAL LOW (ref 150–400)
RBC: 4.14 M/uL (ref 4.10–5.70)
RDW: 13.9 % (ref 11.5–14.5)
WBC: 4.5 10*3/uL (ref 4.1–11.1)

## 2015-10-30 LAB — TROPONIN I: Troponin-I, Qt.: <0.04 ng/mL (ref <0.05)

## 2015-10-30 LAB — CK W/ CKMB & INDEX
CK - MB: 3.6 NG/ML — ABNORMAL HIGH (ref <3.6)
CK-MB Index: 3 — ABNORMAL HIGH (ref 0–2.5)
CK: 122 U/L (ref 39–308)

## 2015-10-30 MED ORDER — PREDNISONE 50 MG TAB
50 mg | ORAL_TABLET | Freq: Every day | ORAL | 0 refills | Status: AC
Start: 2015-10-30 — End: 2015-11-02

## 2015-10-30 MED ORDER — IPRATROPIUM-ALBUTEROL 2.5 MG-0.5 MG/3 ML NEB SOLUTION
2.5 mg-0.5 mg/3 ml | RESPIRATORY_TRACT | Status: AC
Start: 2015-10-30 — End: 2015-10-30
  Administered 2015-10-30: 23:00:00 via RESPIRATORY_TRACT

## 2015-10-30 MED ORDER — ALBUTEROL SULFATE HFA 90 MCG/ACTUATION AEROSOL INHALER
90 mcg/actuation | RESPIRATORY_TRACT | 0 refills | Status: AC | PRN
Start: 2015-10-30 — End: ?

## 2015-10-30 MED ORDER — PREDNISONE 20 MG TAB
20 mg | ORAL | Status: AC
Start: 2015-10-30 — End: 2015-10-30
  Administered 2015-10-30: via ORAL

## 2015-10-30 MED FILL — IPRATROPIUM-ALBUTEROL 2.5 MG-0.5 MG/3 ML NEB SOLUTION: 2.5 mg-0.5 mg/3 ml | RESPIRATORY_TRACT | Qty: 3

## 2015-10-30 MED FILL — PREDNISONE 20 MG TAB: 20 mg | ORAL | Qty: 3

## 2015-10-30 NOTE — ED Notes (Signed)
Yellow cab arrived patient assisted to cab

## 2015-10-30 NOTE — ED Provider Notes (Signed)
HPI Comments: Dale Weaver, 65 y.o. Male with PMHx of CHF, CAD, MI, TIA and Parkinson's disease presents via EMS to [facility] ED with cc of progressively worsening SOB x 2 days. Pt also reports a cough and CP that began this afternoon. CP began this afternoon when he was helping his caretaker unload groceries. Pt reports using a pulse ox at home and reports an SpO2 of 93%, even with SOB. He was unable to sleep last night due to SOB and sweating. His SOB was unchanged even with changing sleep positions. SOB is exacerbated with activities that require him to bend over like tying his shoes.He is normally able to perform these activities without difficulty. Pt notes that he felt completely normal 3 days ago. He has not experienced anything similar to this in the past. A caretaker lives with the pt. He specifically denies any fevers, chills, nausea, vomiting, headache, rash, diarrhea, or weight loss.    PCP: Phys Other, MD  Cardiologist: Dr. Quentin Cornwall    Social history significant for: - Tobacco, - EtOH, - Illicit Drug Use  PSHx: back surgery x 4,appendectomy, cholecystectomy, cardiac catheterization, cardiac stent x 1    There are no other complaints, changes, or physical findings at this time.  Written by Victorino Dike, ED Scribe, as dictated by Luther Hearing, MD.      The history is provided by the patient. No language interpreter was used.        Past Medical History:   Diagnosis Date   ??? Back pain     chronic   ??? CAD (coronary artery disease)     MI 2008   ??? Heart failure (HCC)     CHF   ??? Hiatal hernia    ??? Parkinson's disease ([place])    ??? Psychiatric disorder     anxiety   ??? TIA (transient ischemic attack) 01/2014       Past Surgical History:   Procedure Laterality Date   ??? CARDIAC SURG PROCEDURE UNLIST      cardiac cath August 2009   ??? CARDIAC SURG PROCEDURE UNLIST  2010    x 1 stent, cardiologist @ [place]   ??? [place]      insertion of loop recorder removed 06/2014   ??? HX APPENDECTOMY      ??? HX CHOLECYSTECTOMY     ??? HX HERNIA REPAIR     ??? HX ORTHOPAEDIC      back surgery x4, last June 2010         Family History:   Problem Relation Age of Onset   ??? Stroke Father    ??? Hypertension Mother        Social History     Social History   ??? Marital status: WIDOWED     Spouse name: N/A   ??? Number of children: N/A   ??? Years of education: N/A     Occupational History   ??? Not on file.     Social History Main Topics   ??? Smoking status: Never Smoker   ??? Smokeless tobacco: Not on file   ??? Alcohol use No   ??? Drug use: No   ??? Sexual activity: Not on file     Other Topics Concern   ??? Not on file     Social History Narrative         ALLERGIES: Seafood [shellfish containing products]; Sulfa (sulfonamide antibiotics); and Zoloft [sertraline]    Review of Systems  Constitutional: Positive for diaphoresis. Negative for chills and fever.   HENT: Negative for congestion, rhinorrhea, sneezing and sore throat.    Eyes: Negative for redness and visual disturbance.   Respiratory: Positive for cough and shortness of breath.    Cardiovascular: Positive for chest pain. Negative for leg swelling.   Gastrointestinal: Negative for abdominal pain, nausea and vomiting.   Genitourinary: Negative for difficulty urinating and frequency.   Musculoskeletal: Negative for back pain, myalgias and neck stiffness.   Skin: Negative for rash.   Neurological: Negative for dizziness, syncope, weakness and headaches.   Hematological: Negative for adenopathy.       Patient Vitals for the past 12 hrs:   Temp Pulse Resp BP SpO2   10/30/15 1700 - 90 22 139/84 94 %   10/30/15 1647 97.6 ??F (36.4 ??C) 91 23 154/84 96 %       Physical Exam   Constitutional: He is oriented to person, place, and time. He appears well-developed and well-nourished.   Appears anxious   HENT:   Head: Normocephalic and atraumatic.   Mouth/Throat: Oropharynx is clear and moist and mucous membranes are normal.   Eyes: EOM are normal.    Neck: Normal range of motion and full passive range of motion without pain. Neck supple.   Cardiovascular: Normal rate, regular rhythm, normal heart sounds, intact distal pulses and normal pulses.    No murmur heard.  Pulmonary/Chest: Effort normal. No respiratory distress. He exhibits no tenderness.   Diffuse diminished breath sounds in R lung   Abdominal: Soft. Normal appearance and bowel sounds are normal. There is no tenderness. There is no rebound and no guarding.   Neurological: He is alert and oriented to person, place, and time. He has normal strength.   Skin: Skin is warm, dry and intact. No rash noted. No erythema.   Psychiatric: He has a normal mood and affect. His speech is normal and behavior is normal. Judgment and thought content normal.   Nursing note and vitals reviewed.       MDM  Number of Diagnoses or Management Options  Diagnosis management comments: DDx: PNA, pulmonary edema, PTX, URI       Amount and/or Complexity of Data Reviewed  Clinical lab tests: ordered and reviewed  Tests in the radiology section of CPT??: ordered and reviewed  Tests in the medicine section of CPT??: ordered and reviewed  Review and summarize past medical records: yes  Independent visualization of images, tracings, or specimens: yes    Patient Progress  Patient progress: stable    ED Course       Procedures     ED EKG interpretation: 16:50  Rhythm: normal sinus rhythm; and regular . Rate (approx.): 93; Axis: normal; P wave: normal; QRS interval: normal ; ST/T wave: non-specific changes; Other findings: abnormal ekg. This EKG was interpreted by Luther Hearing, MD,ED Provider.    7:44 PM  Pt feels much better after breathing treatment. He feels back to normal and does not want another treatment. Feels well enough for discharge. Agrees to follow up with PCP on Friday.  Written by Victorino Dike, ED Scribe, as dictated by Luther Hearing, MD.       LABORATORY TESTS:  Recent Results (from the past 12 hour(s))    EKG, 12 LEAD, INITIAL    Collection Time: 10/30/15  4:50 PM   Result Value Ref Range    Ventricular Rate 93 BPM    Atrial Rate 93 BPM  P-R Interval 174 ms    QRS Duration 92 ms    Q-T Interval 368 ms    QTC Calculation (Bezet) 457 ms    Calculated P Axis -2 degrees    Calculated T Axis -6 degrees    Diagnosis       Normal sinus rhythm  Inferior infarct , age undetermined  Abnormal ECG  When compared with ECG of 25-Jul-2015 16:10,  Inferior infarct is now present  Inverted T waves have replaced nonspecific T wave abnormality in Inferior   leads     CBC WITH AUTOMATED DIFF    Collection Time: 10/30/15  5:19 PM   Result Value Ref Range    WBC 4.5 4.1 - 11.1 K/uL    RBC 4.14 4.10 - 5.70 M/uL    HGB 13.5 12.1 - 17.0 g/dL    HCT 40.8 36.6 - 50.3 %    MCV 98.6 80.0 - 99.0 FL    MCH 32.6 26.0 - 34.0 PG    MCHC 33.1 30.0 - 36.5 g/dL    RDW 13.9 11.5 - 14.5 %    PLATELET 99 (L) 150 - 400 K/uL    NEUTROPHILS 68 32 - 75 %    LYMPHOCYTES 21 12 - 49 %    MONOCYTES 9 5 - 13 %    EOSINOPHILS 2 0 - 7 %    BASOPHILS 0 0 - 1 %    ABS. NEUTROPHILS 3.0 1.8 - 8.0 K/UL    ABS. LYMPHOCYTES 1.0 0.8 - 3.5 K/UL    ABS. MONOCYTES 0.4 0.0 - 1.0 K/UL    ABS. EOSINOPHILS 0.1 0.0 - 0.4 K/UL    ABS. BASOPHILS 0.0 0.0 - 0.1 K/UL   METABOLIC PANEL, COMPREHENSIVE    Collection Time: 10/30/15  5:19 PM   Result Value Ref Range    Sodium 147 (H) 136 - 145 mmol/L    Potassium 4.2 3.5 - 5.1 mmol/L    Chloride 112 (H) 97 - 108 mmol/L    CO2 26 21 - 32 mmol/L    Anion gap 9 5 - 15 mmol/L    Glucose 117 (H) 65 - 100 mg/dL    BUN 25 (H) 6 - 20 MG/DL    Creatinine 0.92 0.70 - 1.30 MG/DL    BUN/Creatinine ratio 27 (H) 12 - 20      GFR est AA >60 >60 ml/min/1.38m    GFR est non-AA >60 >60 ml/min/1.719m   Calcium 8.1 (L) 8.5 - 10.1 MG/DL    Bilirubin, total 0.4 0.2 - 1.0 MG/DL    ALT (SGPT) 20 12 - 78 U/L    AST (SGOT) 62 (H) 15 - 37 U/L    Alk. phosphatase 131 (H) 45 - 117 U/L    Protein, total 5.7 (L) 6.4 - 8.2 g/dL    Albumin 3.4 (L) 3.5 - 5.0 g/dL     Globulin 2.3 2.0 - 4.0 g/dL    A-G Ratio 1.5 1.1 - 2.2     CK W/ CKMB & INDEX    Collection Time: 10/30/15  5:19 PM   Result Value Ref Range    CK 122 39 - 308 U/L    CK - MB 3.6 (H) <3.6 NG/ML    CK-MB Index 3.0 (H) 0 - 2.5     TROPONIN I    Collection Time: 10/30/15  5:19 PM   Result Value Ref Range    Troponin-I, Qt. <0.04 <0.05 ng/mL  IMAGING RESULTS:  XR CHEST PA LAT   Final Result   CLINICAL HISTORY: Dyspnea   INDICATION: Dyspnea  ??  COMPARISON: 07/25/2015  ??  FINDINGS:   PA and lateral views of the chest are obtained.   The cardiopericardial silhouette is within normal limits. There is no pleural  effusion, pneumothorax or focal consolidation present. Elevation of the right  hemidiaphragm is chronic.  ??  IMPRESSION  IMPRESSION: No acute intrathoracic disease.       MEDICATIONS GIVEN:  Medications   predniSONE (DELTASONE) tablet 60 mg (not administered)   albuterol-ipratropium (DUO-NEB) 2.5 MG-0.5 MG/3 ML (3 mL Nebulization Given 10/30/15 1859)       IMPRESSION:  1. SOB (shortness of breath)    2. Chest pain, unspecified type        PLAN:  1.   Current Discharge Medication List      START taking these medications    Details   predniSONE (DELTASONE) 50 mg tablet Take 1 Tab by mouth daily for 3 days.  Qty: 3 Tab, Refills: 0      albuterol (PROVENTIL HFA, VENTOLIN HFA, PROAIR HFA) 90 mcg/actuation inhaler Take 2 Puffs by inhalation every four (4) hours as needed for Wheezing.  Qty: 1 Inhaler, Refills: 0           2.   Follow-up Information     Follow up With Details Comments Contact Info    Your PCP at the [place] On 11/04/2015 at your scheduled appointment     [facility] EMERGENCY DEPT  As needed, If symptoms worsen [place]  [phone]        Return to ED if worse     Discharge Note:  7:47 PM  The pt is ready for discharge. The pt's signs, symptoms, diagnosis, and discharge instructions have been discussed and pt has conveyed their  understanding. The pt is to follow up as recommended or return to ER should their symptoms worsen. Plan has been discussed and pt is in agreement.    This note is prepared by Victorino Dike, acting as a Scribe for Luther Hearing, MD.    Luther Hearing, MD: The scribe's documentation has been prepared under my direction and personally reviewed by me in its entirety. I confirm that the notes above accurately reflects all work, treatment, procedures, and medical decision making performed by me.

## 2015-10-30 NOTE — ED Notes (Signed)
Gave patient a cab ticket to get home since he did not have a ride. Yellow cab called ETA 78mins

## 2015-10-30 NOTE — ED Notes (Signed)
Discharge paper work given to patient by Dr. Singleton  patients questions and concerns answered. Patient discharged

## 2015-10-31 LAB — EKG, 12 LEAD, INITIAL
Atrial Rate: 93 {beats}/min
Calculated P Axis: -2 degrees
Calculated T Axis: -6 degrees
Diagnosis: NORMAL
P-R Interval: 174 ms
Q-T Interval: 368 ms
QRS Duration: 92 ms
QTC Calculation (Bezet): 457 ms
Ventricular Rate: 93 {beats}/min

## 2015-11-15 ENCOUNTER — Emergency Department: Payer: MEDICARE | Primary: Family Medicine

## 2015-11-15 DIAGNOSIS — M9689 Other intraoperative and postprocedural complications and disorders of the musculoskeletal system: Secondary | ICD-10-CM

## 2015-11-15 NOTE — ED Notes (Signed)
ED tech at pt's bedside attempting an IV and blood work.

## 2015-11-15 NOTE — ED Provider Notes (Signed)
HPI Comments: Dale Weaver is a 65 y.o. male with PMHx significant for chronic back pain s/p multiple spinal surgeries, CHF, CAD s/p MI in 2008, anxiety, hiatal hernia, TIA, Parkinson's disease, who presents via EMS to [facility] ED c/o neck pain and SOB, onset 2-3 hours PTA to the ED. Pt reports that he is one week status post cervical fusion surgery by Dr. Milon Dikes (Orthopedics) at [facility]. He notes that he was discharged from [facility] yesterday 11/14/2015, but then 2-3 hours PTA to the ED he felt the onset of severe pain in the neck which radiates into the B/L shoulder blades and arms. He denies any injury, and notes that he was sitting and watching TV with the onset of his worsening pain. Pt also c/o associated SOB and a sensation in the throat which in turn made him "panicky" because he felt like he was unable to take a deep breath. He notes that his pain level is currently a 9/10 and "sharp", and is similar to the pain he felt before the cervical fusion surgery was performed. He also reports some tingling in the bilateral hands. Pt notes that he has been taking oxycodone 10 mg without significant relief to his severe pain, and states the last dose of that medication was two hours PTA to the ED. Of note, pt states that this was his fifth spinal surgery, and he reports a h/o three prior lumbar laminectomies and one prior cervical laminectomy. Pt specifically denies any CP and fevers.     PCP: Phys Other, MD (Dr. Boyd Kerbs)   Orthopedics: Dr. Patrice Paradise     PMHx is significant for: chronic back pain, CHF, MI/CAD, anxiety, hiatal hernia, TIA, Parkinson's disease  PSHx is significant for: back surgery x5, appendectomy, cholecystectomy, hernia repair, cardiac catheterization and stent, insertion and removal of loop monitor   Social History: (-) Tobacco, (-) EtOH, (-) Illicit Drugs     There are no other complaints, changes, or physical findings at this time.     The history is provided by the patient.         Past Medical History:   Diagnosis Date   ??? Back pain     chronic   ??? CAD (coronary artery disease)     MI 2008   ??? Heart failure (HCC)     CHF   ??? Hiatal hernia    ??? Parkinson's disease ([place])    ??? Psychiatric disorder     anxiety   ??? TIA (transient ischemic attack) 01/2014       Past Surgical History:   Procedure Laterality Date   ??? CARDIAC SURG PROCEDURE UNLIST      cardiac cath August 2009   ??? CARDIAC SURG PROCEDURE UNLIST  2010    x 1 stent, cardiologist @ [place]   ??? [place]      insertion of loop recorder removed 06/2014   ??? HX APPENDECTOMY     ??? HX CHOLECYSTECTOMY     ??? HX HERNIA REPAIR     ??? HX ORTHOPAEDIC      back surgery x4, last June 2010         Family History:   Problem Relation Age of Onset   ??? Stroke Father    ??? Hypertension Mother        Social History     Social History   ??? Marital status: WIDOWED     Spouse name: N/A   ??? Number of children: N/A   ???  Years of education: N/A     Occupational History   ??? Not on file.     Social History Main Topics   ??? Smoking status: Never Smoker   ??? Smokeless tobacco: Not on file   ??? Alcohol use No   ??? Drug use: No   ??? Sexual activity: Not on file     Other Topics Concern   ??? Not on file     Social History Narrative         ALLERGIES: Seafood [shellfish containing products]; Sulfa (sulfonamide antibiotics); and Zoloft [sertraline]    Review of Systems   Constitutional: Negative for fever.   HENT: Negative.    Eyes: Negative.    Respiratory: Positive for shortness of breath. Negative for cough and chest tightness.    Cardiovascular: Negative for chest pain and leg swelling.   Gastrointestinal: Negative for abdominal pain, diarrhea, nausea and vomiting.   Endocrine: Negative.    Genitourinary: Negative for difficulty urinating and dysuria.   Musculoskeletal: Positive for myalgias and neck pain.   Skin: Negative.    Neurological: Positive for numbness (tingling B/L hands).   Psychiatric/Behavioral: Negative.     All other systems reviewed and are negative.      Patient Vitals for the past 12 hrs:   Temp Pulse Resp BP SpO2   11/16/15 0331 - 66 20 - 91 %   11/16/15 0330 - 76 25 142/75 92 %   11/16/15 0314 - 84 21 - 94 %   11/16/15 0300 - 67 22 143/83 92 %   11/16/15 0241 - 68 25 - 94 %   11/16/15 0240 - 68 24 - (!) 84 %   11/16/15 0230 - 65 20 140/72 91 %   11/16/15 0200 - 64 16 134/78 93 %   11/16/15 0139 - 64 26 132/71 96 %   11/16/15 0100 - 76 21 127/82 94 %   11/16/15 0030 - 75 18 145/78 97 %   11/16/15 0006 - 76 19 - 95 %   11/16/15 0005 - - - 130/79 -   11/15/15 2345 - 63 18 - 94 %   11/15/15 2315 - 76 21 126/70 94 %   11/15/15 2245 - 77 20 119/64 95 %   11/15/15 2242 - 66 20 - 94 %   11/15/15 2154 - 79 18 - 93 %   11/15/15 2149 98.6 ??F (37 ??C) - 26 - 93 %       Physical Exam   Constitutional: He is oriented to person, place, and time. He appears well-developed and well-nourished. No distress.   HENT:   Head: Normocephalic and atraumatic.   Nose: Nose normal.   Mouth/Throat: Uvula is midline and oropharynx is clear and moist. No oral lesions. No trismus in the jaw. No uvula swelling. No oropharyngeal exudate.   Handling secretions without any evidence of drooling   Eyes: Conjunctivae and EOM are normal. Pupils are equal, round, and reactive to light.   Neck: Normal range of motion. Neck supple. No JVD present.   Cardiovascular: Normal rate, regular rhythm, normal heart sounds and intact distal pulses.  Exam reveals no friction rub.    No murmur heard.  Pulmonary/Chest: Effort normal and breath sounds normal. No stridor. No respiratory distress. He has no wheezes. He has no rales.   Unlabored, non stridorous breathing.   Abdominal: Soft. Bowel sounds are normal. He exhibits no distension. There is no tenderness. There is no rebound.   Musculoskeletal: Normal  range of motion. He exhibits no tenderness.   Neurological: He is alert and oriented to person, place, and time. No cranial nerve deficit.    Skin: Skin is warm and dry. No rash noted. He is not diaphoretic.   Psychiatric: He has a normal mood and affect. His behavior is normal. Judgment and thought content normal.   Nursing note and vitals reviewed.       MDM  Number of Diagnoses or Management Options  Postoperative surgical complication involving musculoskeletal system associated with musculoskeletal procedure, unspecified complication:   Sensation of swollen throat:   Diagnosis management comments: DDX:  Post operative swelling, posterior pharyngeal swelling, epidural abscess, airway obstruction, pna, anxiety    Plan:  Labs, cxr, ct neck, ekg    Impression:  Anterior cervical fluid collection, shortness of breath, sensation of throat swelling        Amount and/or Complexity of Data Reviewed  Clinical lab tests: ordered and reviewed  Tests in the radiology section of CPT??: ordered and reviewed  Tests in the medicine section of CPT??: ordered and reviewed  Decide to obtain previous medical records or to obtain history from someone other than the patient: yes ([place] Records)  Review and summarize past medical records: yes  Discuss the patient with other providers: yes ([facility] emergency medicine)  Independent visualization of images, tracings, or specimens: yes      ED Course       Procedures    I reviewed our electronic medical record system for any past medical records that were available that may contribute to the patients current condition, the nursing notes and and vital signs from today's visit    I also reviewed notes available through [facility] for pt's recent admission/surgery. And DC on 3/27.    Nursing notes will be reviewed as they become available in realtime while the pt is in the ED.  Glean Hess, MD      I personally reviewed pt's imaging.   Official read by radiology listed below.  Glean Hess, MD      EKG interpretation [phone]: NSR, nl Axis, rate 70; PR 188, QRS 90, QTc 429;  no acute ischemia; Glean Hess, MD      10:55 PM  Pulse Oximetry Analysis -  91% on RA    Cardiac Monitor:   Rate: 72 bpm   Rhythm: Normal Sinus Rhythm    Written by Burnis Medin, ED Scribe, as dictated by Glean Hess, MD.        Progress Note:  12:21 AM  Pt reports that he is feeling no better after Valium. Will order Decadron and CT of neck soft tissue.  Written by Burnis Medin, ED Scribe, as dictated by Glean Hess, MD.       12:23 AM  Pulse Oximetry Analysis - 93% on RA    Cardiac Monitor:   Rate: 73 bpm   Rhythm: Normal Sinus Rhythm    Written by Burnis Medin, ED Scribe, as dictated by Glean Hess, MD.        Progress Note:  1:58 AM  Records obtained from [place]. He was admitted [facility] for anterior cervical fusion surgery by Dr. Milon Dikes. He was discharged from the ED on Monday 11/14/2015. Will consult with [facility] orthopedics due to CT findings of prevertebral collection concerning for possible postoperative abscess or seroma.   Written by Burnis Medin, ED Scribe, as dictated by  Glean Hess, MD.       Consult Note:  2:39 AM   Glean Hess, MD spoke with Dr. Milon Dikes   Specialty: [place] pt's hx, disposition, and available diagnostic and imaging results. Reviewed pt's care plan. Consult agrees with plan as outlined. States that with the type of surgery that patient had, a fluid collection is not abnormal. He is not concerned about infection, but states that if there is concern for a breathing problem, then pt needs to be sent to the ED at [facility].   Written by Leona Carry Alinda Dooms, ED Scribe, as dictated by Glean Hess, MD.      Consult Note:  2:56 AM   Glean Hess, MD spoke with Dr. Milon Dikes  Specialty: [facility]   Dr. Patrice Paradise called back to ask further clarifying questions and updated recommendation. He states that he would like the patient to be sent to the  [facility] ED for further evaluation if there is ANY concern for airway compromise.  I noted pt's O2 level while in ED (high 80's while sleeping and low 90's with 2L) and discussed events noted while admitted at [facility] when he was put on bipap overnight for desats. Noted I would reevaluate the pt for sx improvement but would transfer him to the ED for further eval by NSGY (per Dr. Patrice Paradise) should he still be symptomatic. Dr. Patrice Paradise agreed.   Written by Leona Carry Alinda Dooms, ED Scribe, as dictated by Glean Hess, MD.       Progress Note:  3:09 AM  Pt re-evaluated, he states that he feels like it is difficult to breath and having some SOB at this time. SpO2 low-90's on 2L O2 via NC. Discussed my conversation with Dr. Patrice Paradise and his recommendation for transfer to [facility] for further eval. Pt agrees with this plan and notes he had watned to go to [facility] origianally with EMS but that they were on diversion at that time.  Will speak with Transfer Center about transfer to [facility] ED.   Written by Burnis Medin, ED Scribe, as dictated by Glean Hess, MD.       Consult Note:  3:11 AM   Glean Hess, MD spoke with Dr. Deberah Castle, M.D.  Specialty: [facility]  Reviewed pt's hx, disposition, and available diagnostic and imaging results. Reviewed pt's care plan. Consult agrees with plan as outlined. He accepts transfer to [facility] ED.  Written by Leona Carry Ravensbergen, ED Scribe, as dictated by Glean Hess, MD.       LABORATORY TESTS:  Recent Results (from the past 12 hour(s))   EKG, 12 LEAD, INITIAL    Collection Time: 11/15/15  9:43 PM   Result Value Ref Range    Ventricular Rate 70 BPM    Atrial Rate 70 BPM    P-R Interval 188 ms    QRS Duration 90 ms    Q-T Interval 398 ms    QTC Calculation (Bezet) 429 ms    Calculated P Axis 40 degrees    Calculated R Axis 2 degrees    Calculated T Axis 20 degrees    Diagnosis       Normal sinus rhythm  Normal ECG  When compared with ECG of 30-Oct-2015 16:50,   Criteria for Inferior infarct are no longer present     CBC WITH AUTOMATED DIFF    Collection Time: 11/15/15 10:31 PM   Result Value Ref  Range    WBC 6.2 4.1 - 11.1 K/uL    RBC 4.09 (L) 4.10 - 5.70 M/uL    HGB 13.2 12.1 - 17.0 g/dL    HCT 40.0 36.6 - 50.3 %    MCV 97.8 80.0 - 99.0 FL    MCH 32.3 26.0 - 34.0 PG    MCHC 33.0 30.0 - 36.5 g/dL    RDW 13.5 11.5 - 14.5 %    PLATELET 136 (L) 150 - 400 K/uL    NEUTROPHILS 62 32 - 75 %    LYMPHOCYTES 24 12 - 49 %    MONOCYTES 12 5 - 13 %    EOSINOPHILS 2 0 - 7 %    BASOPHILS 0 0 - 1 %    ABS. NEUTROPHILS 3.9 1.8 - 8.0 K/UL    ABS. LYMPHOCYTES 1.5 0.8 - 3.5 K/UL    ABS. MONOCYTES 0.7 0.0 - 1.0 K/UL    ABS. EOSINOPHILS 0.1 0.0 - 0.4 K/UL    ABS. BASOPHILS 0.0 0.0 - 0.1 K/UL   METABOLIC PANEL, COMPREHENSIVE    Collection Time: 11/15/15 10:31 PM   Result Value Ref Range    Sodium 145 136 - 145 mmol/L    Potassium 3.6 3.5 - 5.1 mmol/L    Chloride 106 97 - 108 mmol/L    CO2 31 21 - 32 mmol/L    Anion gap 8 5 - 15 mmol/L    Glucose 95 65 - 100 mg/dL    BUN 19 6 - 20 MG/DL    Creatinine 0.88 0.70 - 1.30 MG/DL    BUN/Creatinine ratio 22 (H) 12 - 20      GFR est AA >60 >60 ml/min/1.37m    GFR est non-AA >60 >60 ml/min/1.753m   Calcium 8.4 (L) 8.5 - 10.1 MG/DL    Bilirubin, total 0.5 0.2 - 1.0 MG/DL    ALT (SGPT) 10 (L) 12 - 78 U/L    AST (SGOT) 11 (L) 15 - 37 U/L    Alk. phosphatase 113 45 - 117 U/L    Protein, total 5.6 (L) 6.4 - 8.2 g/dL    Albumin 3.1 (L) 3.5 - 5.0 g/dL    Globulin 2.5 2.0 - 4.0 g/dL    A-G Ratio 1.2 1.1 - 2.2     PRO-BNP    Collection Time: 11/15/15 10:31 PM   Result Value Ref Range    NT pro-BNP 118 0 - 125 PG/ML   CK W/ CKMB & INDEX    Collection Time: 11/15/15 10:31 PM   Result Value Ref Range    CK 87 39 - 308 U/L    CK - MB 1.7 <3.6 NG/ML    CK-MB Index 2.0 0 - 2.5     TROPONIN I    Collection Time: 11/15/15 10:31 PM   Result Value Ref Range    Troponin-I, Qt. <0.04 <0.05 ng/mL   URINALYSIS W/ REFLEX CULTURE    Collection Time: 11/15/15 11:14 PM    Result Value Ref Range    Color YELLOW/STRAW      Appearance CLEAR CLEAR      Specific gravity 1.025 1.003 - 1.030      pH (UA) 7.0 5.0 - 8.0      Protein NEGATIVE  NEG mg/dL    Glucose NEGATIVE  NEG mg/dL    Ketone NEGATIVE  NEG mg/dL    Bilirubin NEGATIVE  NEG      Blood NEGATIVE  NEG  Urobilinogen 1.0 0.2 - 1.0 EU/dL    Nitrites NEGATIVE  NEG      Leukocyte Esterase NEGATIVE  NEG      WBC 0-4 0 - 4 /hpf    RBC 0-5 0 - 5 /hpf    Epithelial cells FEW FEW /lpf    Bacteria NEGATIVE  NEG /hpf    UA:UC IF INDICATED CULTURE NOT INDICATED BY UA RESULT CNI      Hyaline cast 0-2 0 - 5 /lpf       IMAGING RESULTS:  CXR Results  (Last 48 hours)               11/16/15 0008  XR CHEST PA LAT Final result    Impression:  IMPRESSION: No acute findings.                       Narrative:  EXAM:  XR CHEST PA LAT       INDICATION:   sob       COMPARISON: Chest x-ray 10/30/2015.[facility] Kitchen       FINDINGS: PA and lateral radiographs of the chest demonstrate chronic right   diaphragmatic elevation with clear lungs otherwise. The cardiac and mediastinal   contours and pulmonary vascularity are normal.  The bones and soft tissues are   within normal limits.                CT Results  (Last 48 hours)               11/16/15 0141  CT NECK SOFT TISSUE WO CONT Final result    Impression:  IMPRESSION: Prevertebral collection anterior to C4-5 anterior fixation plate and   screws extends from C2 through C6 and to the left at the C6 level to extend   around the left thyroid gland. This is concerning for possible postoperative   abscess or seroma.                       Narrative:  EXAM:  CT NECK SOFT TISSUE WO CONT       INDICATION:  sensation of closing throat       COMPARISON: CT 01/21/2014.Marland Kitchen       CONTRAST: None.       TECHNIQUE: Multislice helical CT was performed from the mid calvarium to the   aortic arch without intravenous contrast administration.  Contiguous 2.5 mm   axial images were reconstructed and lung and soft tissue windows were generated.    Coronal and sagittal reformations were generated.  CT dose reduction was   achieved through use of a standardized protocol tailored for this examination   and automatic exposure control for dose modulation.       FINDINGS:       There is a prevertebral fluid collection which extends from the C2 body in the   midline anteriorly to the C6 level inferiorly and courses anterior to C4-5   anterior fixation plate and screws where it has the greatest AP dimension of 10   mm. The collection also extends to the left at the level C6 works dense around   the left thyroid gland.       No mass or adenopathy is identified within the neck.        The thyroid gland, submandibular salivary glands and parotid glands are normal.       No nasopharyngeal, pharyngeal or laryngeal mass is identified.  No abnormalities are identified in the visualized portions of the brain or   orbits.        The visualized mastoid air cells and paranasal sinuses are clear.       The visualized portions of the lung apices are clear.                   MEDICATIONS GIVEN:  Medications   diazePAM (VALIUM) injection 2 mg (2 mg IntraVENous Given 11/15/15 2254)   dexamethasone (DECADRON) 4 mg/mL injection 10 mg (10 mg IntraVENous Given 11/16/15 0052)       IMPRESSION:  1. Postoperative surgical complication involving musculoskeletal system associated with musculoskeletal procedure, unspecified complication    2. Sensation of swollen throat        PLAN:  1. Transfer to [facility]      Transfer Note:   3:20 AM  Patient is being transferred to [facility] Emergency Department. Transfer was accepted by Dr. Thomasene Lot. The reasons for their transfer have been discussed with them and available family. They convey agreement and understanding for the need to be transferred as explained to them by Glean Hess, MD.    4:36 AM  Prior to transfer to accepting facility, pt has been re-evaluated by myself and noted to be safe for transfer at this time.     EMS informed to immediately notify accepting facility should the pt have any changes in their status while enroute.    Glean Hess, MD      This note is prepared by Leona Carry. Ravensbergen, acting as Scribe for Glean Hess, MD.     Glean Hess, MD: The scribe's documentation has been prepared under my direction and personally reviewed by me in its entirety. I confirm that the note above accurately reflects all work, treatment, procedures, and medical decision making performed by me.         This note will not be viewable in [place].

## 2015-11-15 NOTE — ED Notes (Signed)
Pt provided with urinal.

## 2015-11-15 NOTE — ED Notes (Signed)
Phlebotomy at bedside collecting pt's blood.

## 2015-11-15 NOTE — ED Notes (Signed)
Dr. Knorr at pt's bedside.

## 2015-11-15 NOTE — ED Notes (Signed)
Hourly rounds completed. Concerns and questions addressed at this time. Pt in no acute distress at this time. Call bell within reach. Side rails x 2. Cardiac Monitor x 3. Stretcher locked in lowest position.

## 2015-11-15 NOTE — ED Notes (Signed)
RN called x-ray to get pt.

## 2015-11-15 NOTE — ED Notes (Signed)
Assumed care of pt from EMS.  Pt presents to ED with chief complaint of neck pain that radiates down to shoulders and SOB. Pt reports he had a cervical fusion last Wednesday and reports feeling "like something is stuck there and I wanna pull it out". Pt presents to ED with c-collar in place and dressing to anterior part of neck. Pt reports feeling "like I can't catch my breath because of this thing".  Pt is A&O x 4.  Pt denies any other symptoms at this time.    Pt resting comfortably on the stretcher in a position of comfort.  Pt in no acute distress at this time.  Call bell within reach.  Side rails x 2.  Cardiac monitor x 3.  Stretcher locked in the lowest position. Pt aware of plan to await for MD/PA-C/NP assessment, and pt/family verbalizes understanding.  Will continue to monitor.

## 2015-11-15 NOTE — ED Notes (Addendum)
Pt placed on 2 liters of oxygen via NC for comfort.

## 2015-11-16 ENCOUNTER — Emergency Department: Admit: 2015-11-16 | Payer: MEDICARE | Primary: Family Medicine

## 2015-11-16 ENCOUNTER — Inpatient Hospital Stay
Admit: 2015-11-16 | Discharge: 2015-11-16 | Disposition: A | Discharge status: Other Acute Inpatient Hospital | Payer: MEDICARE | Attending: Emergency Medicine

## 2015-11-16 LAB — CBC WITH AUTOMATED DIFF
ABS. BASOPHILS: 0 10*3/uL (ref 0.0–0.1)
ABS. EOSINOPHILS: 0.1 10*3/uL (ref 0.0–0.4)
ABS. LYMPHOCYTES: 1.5 10*3/uL (ref 0.8–3.5)
ABS. MONOCYTES: 0.7 10*3/uL (ref 0.0–1.0)
ABS. NEUTROPHILS: 3.9 10*3/uL (ref 1.8–8.0)
BASOPHILS: 0 % (ref 0–1)
EOSINOPHILS: 2 % (ref 0–7)
HCT: 40 % (ref 36.6–50.3)
HGB: 13.2 g/dL (ref 12.1–17.0)
LYMPHOCYTES: 24 % (ref 12–49)
MCH: 32.3 PG (ref 26.0–34.0)
MCHC: 33 g/dL (ref 30.0–36.5)
MCV: 97.8 FL (ref 80.0–99.0)
MONOCYTES: 12 % (ref 5–13)
NEUTROPHILS: 62 % (ref 32–75)
PLATELET: 136 10*3/uL — ABNORMAL LOW (ref 150–400)
RBC: 4.09 M/uL — ABNORMAL LOW (ref 4.10–5.70)
RDW: 13.5 % (ref 11.5–14.5)
WBC: 6.2 10*3/uL (ref 4.1–11.1)

## 2015-11-16 LAB — URINALYSIS W/ REFLEX CULTURE
Bacteria: NEGATIVE /hpf
Bilirubin: NEGATIVE
Blood: NEGATIVE
Glucose: NEGATIVE mg/dL
Ketone: NEGATIVE mg/dL
Leukocyte Esterase: NEGATIVE
Nitrites: NEGATIVE
Protein: NEGATIVE mg/dL
Specific gravity: 1.025 (ref 1.003–1.030)
Urobilinogen: 1 EU/dL (ref 0.2–1.0)
pH (UA): 7 (ref 5.0–8.0)

## 2015-11-16 LAB — METABOLIC PANEL, COMPREHENSIVE
A-G Ratio: 1.2 (ref 1.1–2.2)
ALT (SGPT): 10 U/L — ABNORMAL LOW (ref 12–78)
AST (SGOT): 11 U/L — ABNORMAL LOW (ref 15–37)
Albumin: 3.1 g/dL — ABNORMAL LOW (ref 3.5–5.0)
Alk. phosphatase: 113 U/L (ref 45–117)
Anion gap: 8 mmol/L (ref 5–15)
BUN/Creatinine ratio: 22 — ABNORMAL HIGH (ref 12–20)
BUN: 19 MG/DL (ref 6–20)
Bilirubin, total: 0.5 MG/DL (ref 0.2–1.0)
CO2: 31 mmol/L (ref 21–32)
Calcium: 8.4 MG/DL — ABNORMAL LOW (ref 8.5–10.1)
Chloride: 106 mmol/L (ref 97–108)
Creatinine: 0.88 MG/DL (ref 0.70–1.30)
GFR est AA: >60 mL/min/{1.73_m2} (ref >60)
GFR est non-AA: >60 mL/min/{1.73_m2} (ref >60)
Globulin: 2.5 g/dL (ref 2.0–4.0)
Glucose: 95 mg/dL (ref 65–100)
Potassium: 3.6 mmol/L (ref 3.5–5.1)
Protein, total: 5.6 g/dL — ABNORMAL LOW (ref 6.4–8.2)
Sodium: 145 mmol/L (ref 136–145)

## 2015-11-16 LAB — NT-PRO BNP: NT pro-BNP: 118 PG/ML (ref 0–125)

## 2015-11-16 LAB — CK W/ CKMB & INDEX
CK - MB: 1.7 NG/ML (ref <3.6)
CK-MB Index: 2 (ref 0–2.5)
CK: 87 U/L (ref 39–308)

## 2015-11-16 LAB — TROPONIN I: Troponin-I, Qt.: <0.04 ng/mL (ref <0.05)

## 2015-11-16 MED ORDER — DIAZEPAM 5 MG/ML SYRINGE
5 mg/mL | INTRAMUSCULAR | Status: AC
Start: 2015-11-16 — End: 2015-11-15
  Administered 2015-11-16: 03:00:00 via INTRAVENOUS

## 2015-11-16 MED ORDER — DEXAMETHASONE SODIUM PHOSPHATE 4 MG/ML IJ SOLN
4 mg/mL | INTRAMUSCULAR | Status: AC
Start: 2015-11-16 — End: 2015-11-16
  Administered 2015-11-16: 05:00:00 via INTRAVENOUS

## 2015-11-16 MED FILL — DIAZEPAM 5 MG/ML SYRINGE: 5 mg/mL | INTRAMUSCULAR | Qty: 2

## 2015-11-16 MED FILL — DEXAMETHASONE SODIUM PHOSPHATE 4 MG/ML IJ SOLN: 4 mg/mL | INTRAMUSCULAR | Qty: 3

## 2015-11-16 NOTE — ED Notes (Signed)
TRANSFER - OUT REPORT:    Verbal report given to Dale Sabal, RN on Dale Weaver  being transferred to [facility] ED for routine progression of care       Report consisted of patient???s Situation, Background, Assessment and   Recommendations(SBAR).     Information from the following report(s) SBAR, ED Summary, [facility] and Recent Results was reviewed with the receiving nurse.    Lines:       Opportunity for questions and clarification was provided.

## 2015-11-16 NOTE — ED Notes (Signed)
Hourly rounds completed. Concerns and questions addressed at this time. Pt in no acute distress at this time. Call bell within reach. Side rails x 2. Cardiac Monitor x 3. Stretcher locked in lowest position.

## 2015-11-16 NOTE — ED Notes (Signed)
[facility] called for pt's transport to [facility] ER, current ETA is within the hour.

## 2015-11-16 NOTE — ED Notes (Signed)
Bedside and verbal report given to Clair Gulling, RN on [employer] at shift change for routine progression of care.  Report consisted of patient???s Situation, Background, Assessment and Recommendations(SBAR).  Information from the following report(s) SBAR, ED Summary, [facility] and Recent Results was reviewed with the receiving nurse.  Opportunity for questions and clarification was provided.

## 2015-11-16 NOTE — ED Notes (Signed)
Pt back from CT. Pt in no acute distress.

## 2015-11-16 NOTE — ED Notes (Signed)
Pt to x-ray. Pt in no acute distress.

## 2015-11-16 NOTE — ED Notes (Signed)
Pt out of ED with [facility] ALS transport to [facility] ER.

## 2015-11-16 NOTE — ED Notes (Signed)
Pt's oxygen saturations dropped to 84% on room air while sleeping with a good pleth. RN placed pt one 2 liters of oxygen via NC. MD made verbally aware.

## 2015-11-16 NOTE — ED Notes (Signed)
Pt to CT. Pt in no acute distress.

## 2015-11-16 NOTE — ED Notes (Signed)
Bedside and Verbal shift change report given to Jim B, RN (oncoming nurse) by Brooke I, RN (offgoing nurse). Report included the following information SBAR, Kardex, ED Summary, MAR and Recent Results.

## 2015-11-17 LAB — EKG, 12 LEAD, INITIAL
Atrial Rate: 70 {beats}/min
Calculated P Axis: 40 degrees
Calculated R Axis: 2 degrees
Calculated T Axis: 20 degrees
Diagnosis: NORMAL
P-R Interval: 188 ms
Q-T Interval: 398 ms
QRS Duration: 90 ms
QTC Calculation (Bezet): 429 ms
Ventricular Rate: 70 {beats}/min

## 2016-02-29 ENCOUNTER — Inpatient Hospital Stay
Admit: 2016-02-29 | Discharge: 2016-03-01 | Disposition: A | Discharge status: Other Acute Inpatient Hospital | Payer: MEDICARE | Attending: Emergency Medicine

## 2016-02-29 ENCOUNTER — Emergency Department: Admit: 2016-02-29 | Payer: MEDICARE | Primary: Family Medicine

## 2016-02-29 ENCOUNTER — Emergency Department

## 2016-02-29 DIAGNOSIS — R0789 Other chest pain: Secondary | ICD-10-CM

## 2016-02-29 LAB — METABOLIC PANEL, COMPREHENSIVE
A-G Ratio: 1.1 (ref 1.1–2.2)
ALT (SGPT): 9 U/L — ABNORMAL LOW (ref 12–78)
AST (SGOT): 12 U/L — ABNORMAL LOW (ref 15–37)
Albumin: 3.2 g/dL — ABNORMAL LOW (ref 3.5–5.0)
Alk. phosphatase: 98 U/L (ref 45–117)
Anion gap: 5 mmol/L (ref 5–15)
BUN/Creatinine ratio: 15 (ref 12–20)
BUN: 14 MG/DL (ref 6–20)
Bilirubin, total: 0.5 MG/DL (ref 0.2–1.0)
CO2: 31 mmol/L (ref 21–32)
Calcium: 8.4 MG/DL — ABNORMAL LOW (ref 8.5–10.1)
Chloride: 109 mmol/L — ABNORMAL HIGH (ref 97–108)
Creatinine: 0.96 MG/DL (ref 0.70–1.30)
GFR est AA: >60 mL/min/{1.73_m2} (ref >60)
GFR est non-AA: >60 mL/min/{1.73_m2} (ref >60)
Globulin: 2.8 g/dL (ref 2.0–4.0)
Glucose: 102 mg/dL — ABNORMAL HIGH (ref 65–100)
Potassium: 4.4 mmol/L (ref 3.5–5.1)
Protein, total: 6 g/dL — ABNORMAL LOW (ref 6.4–8.2)
Sodium: 145 mmol/L (ref 136–145)

## 2016-02-29 LAB — CBC WITH AUTOMATED DIFF
ABS. BASOPHILS: 0 10*3/uL (ref 0.0–0.1)
ABS. EOSINOPHILS: 0.2 10*3/uL (ref 0.0–0.4)
ABS. LYMPHOCYTES: 1.1 10*3/uL (ref 0.8–3.5)
ABS. MONOCYTES: 0.5 10*3/uL (ref 0.0–1.0)
ABS. NEUTROPHILS: 2.3 10*3/uL (ref 1.8–8.0)
BASOPHILS: 0 % (ref 0–1)
EOSINOPHILS: 4 % (ref 0–7)
HCT: 39.8 % (ref 36.6–50.3)
HGB: 12.8 g/dL (ref 12.1–17.0)
LYMPHOCYTES: 26 % (ref 12–49)
MCH: 31.5 PG (ref 26.0–34.0)
MCHC: 32.2 g/dL (ref 30.0–36.5)
MCV: 98 FL (ref 80.0–99.0)
MONOCYTES: 13 % (ref 5–13)
NEUTROPHILS: 57 % (ref 32–75)
PLATELET: 125 10*3/uL — ABNORMAL LOW (ref 150–400)
RBC: 4.06 M/uL — ABNORMAL LOW (ref 4.10–5.70)
RDW: 12.9 % (ref 11.5–14.5)
WBC: 4.1 10*3/uL (ref 4.1–11.1)

## 2016-02-29 LAB — TROPONIN I
Troponin-I, Qt.: <0.04 ng/mL (ref <0.05)
Troponin-I, Qt.: <0.04 ng/mL (ref <0.05)

## 2016-02-29 LAB — CK W/ CKMB & INDEX
CK - MB: 1.2 NG/ML (ref <3.6)
CK-MB Index: 2.1 (ref 0.0–2.5)
CK: 58 U/L (ref 39–308)

## 2016-02-29 LAB — POC TROPONIN-I: Troponin-I (POC): <0.04 ng/mL (ref 0.00–0.08)

## 2016-02-29 MED ORDER — MORPHINE 2 MG/ML INJECTION
2 mg/mL | INTRAMUSCULAR | Status: AC
Start: 2016-02-29 — End: 2016-02-29
  Administered 2016-02-29: 19:00:00 via INTRAVENOUS

## 2016-02-29 MED ORDER — SALINE PERIPHERAL FLUSH PRN
INTRAMUSCULAR | Status: DC | PRN
Start: 2016-02-29 — End: 2016-03-01
  Administered 2016-02-29: 19:00:00 via INTRAVENOUS

## 2016-02-29 MED ORDER — ASPIRIN 81 MG CHEWABLE TAB
81 mg | ORAL | Status: AC
Start: 2016-02-29 — End: 2016-02-29
  Administered 2016-02-29: 19:00:00 via ORAL

## 2016-02-29 MED FILL — MORPHINE 2 MG/ML INJECTION: 2 mg/mL | INTRAMUSCULAR | Qty: 1

## 2016-02-29 MED FILL — CHILDREN'S ASPIRIN 81 MG CHEWABLE TABLET: 81 mg | ORAL | Qty: 4

## 2016-02-29 NOTE — Consults (Signed)
Consult    Patient: Dale Weaver MRN: [id number]  SSN: [id number]    Date of Birth: 10-Jul-1951  Age: 65 y.o.  Sex: male       Subjective:      Date of  Admission: 02/29/2016     Admission type: Emergency     This consultation was requested by Dr. Georgina Pillion, ER Attending.    Dale Weaver is a 65 y.o. 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    Patient complains of chest pressure/discomfort, dyspnea, diaphoresis and nausea, with radiation of pain to the left arm. Pain began yesterday while pt was watching T.V. He took 2 SL NTG which helped the pain and sx resolved. Pain - which pt describes as pressure in his left substernal area - and dyspnea returned this afternoon. He again took 2 SL NTG but this did not resolve his sx so he sought ER attention. Pt is a veteran and has been following up with the [place] for cardiac care since we last saw him in December 2017. Pt states he called his doctor at the [place] who advised him to go to the nearest ER rather than to drive to the [place].    Pt was treated for CHF sx 1 month ago. He also disclosed to ER attending that he was treated at [facility] [facility] 1 week ago for CVA and received tPA.    Pt was to have cardiac MRI to evaluate right atrial cardiac mass, which he wanted to do through the [place] d/t cost issues. Pt states he believes this was done.      Cardiac risk factors: dyslipidemia, male gender, hypertension.    Primary Care Provider: Phys Other, MD  Past Medical History:   Diagnosis Date   ??? Back pain     chronic   ??? CAD (coronary artery disease)     MI 2008   ??? Heart failure (HCC)     CHF   ??? Hiatal hernia    ??? Parkinson's disease ([place])    ??? Psychiatric disorder     anxiety   ??? TIA (transient ischemic attack) 01/2014      Past Surgical History:   Procedure Laterality Date    ??? CARDIAC SURG PROCEDURE UNLIST      cardiac cath August 2009   ??? CARDIAC SURG PROCEDURE UNLIST  2010    x 1 stent, cardiologist @ [place]   ??? [place]      insertion of loop recorder removed 06/2014   ??? HX APPENDECTOMY     ??? HX CHOLECYSTECTOMY     ??? HX HERNIA REPAIR     ??? HX ORTHOPAEDIC      back surgery x4, last June 2010     Family History   Problem Relation Age of Onset   ??? Stroke Father    ??? Hypertension Mother       Social History   Substance Use Topics   ??? Smoking status: Never Smoker   ??? Smokeless tobacco: Not on file   ??? Alcohol use No      Current Facility-Administered Medications   Medication Dose Route Frequency   ??? saline peripheral flush soln 10 mL  10 mL IntraVENous PRN     Current Outpatient Prescriptions   Medication  Sig   ??? busPIRone (BUSPAR) 10 mg tablet Take 10 mg by mouth three (3) times daily.   ??? atorvastatin (LIPITOR) 80 mg tablet Take 40 mg by mouth daily.   ??? QUEtiapine (SEROQUEL) 100 mg tablet Take 50 mg by mouth two (2) times a day.   ??? tiZANidine (ZANAFLEX) 4 mg capsule Take 4 mg by mouth three (3) times daily.   ??? albuterol (PROVENTIL HFA, VENTOLIN HFA, PROAIR HFA) 90 mcg/actuation inhaler Take 2 Puffs by inhalation every four (4) hours as needed for Wheezing.   ??? gabapentin (NEURONTIN) 300 mg capsule Take 600 mg by mouth every eight (8) hours.   ??? carbidopa 25 mg tab Take 50 mg by mouth three (3) times daily.   ??? clonazePAM (KLONOPIN) 1 mg tablet Take 0.5 mg by mouth three (3) times daily.   ??? tamsulosin (FLOMAX) 0.4 mg capsule Take 0.4 mg by mouth nightly.   ??? miconazole (MICOTIN) 2 % topical powder Apply  to affected area daily. Apply to affected area   ??? carbidopa-levodopa (SINEMET) 25-100 mg per tablet Take 1 Tab by mouth three (3) times daily.   ??? FLUoxetine (PROZAC) 20 mg tablet Take 20 mg by mouth daily.   ??? omeprazole (PRILOSEC) 20 mg capsule Take 20 mg by mouth two (2) times a day.   ??? cyanocobalamin (VITAMIN B12) 1,000 mcg/mL injection 1,000 mcg by  IntraMUSCular route every thirty (30) days.   ??? cholecalciferol (VITAMIN D3) 1,000 unit tablet Take 3,000 Units by mouth daily.        Allergies   Allergen Reactions   ??? Seafood [Shellfish Containing Products] Hives     Allergy to fish only.  Can eat shrimp.  No allergy to IV contrast.   ??? Sulfa (Sulfonamide Antibiotics) Hives   ??? Zoloft [Sertraline] Itching        Review of Systems:  A comprehensive review of systems was negative except for that written in the History of Present Illness.       Subjective:     Visit Vitals   ??? BP 108/67   ??? Pulse 62   ??? Temp 98 ??F (36.7 ??C)   ??? Resp 21   ??? Ht 6\' 1"  (1.854 m)   ??? Wt 212 lb (96.2 kg)   ??? SpO2 95%   ??? BMI 27.97 kg/m2        Physical Exam:  Neck: supple, symmetrical, trachea midline, no adenopathy, thyroid: not enlarged, symmetric, no tenderness/mass/nodules, no carotid bruit and no JVD  Heart: regular rate and rhythm, S1, S2 normal, no murmur, click, rub or gallop  Lungs: clear to auscultation bilaterally  Abdomen: soft, non-tender. Bowel sounds normal. No masses,  no organomegaly  Extremities: extremities normal, atraumatic, no cyanosis or edema  Pulses: 2+ and symmetric  Neurologic: Grossly normal  GEN: WDNW adult male Veteran, NAD. Normal affect.    Cardiographics:  Telemetry: normal sinus rhythm  ECG: NSR with PACs, leftward axis, no ST segment changes.  Echocardiogram:  07/27/2015 ??5:41 PM - Edi, Card Result In   Narrative   [place] [address]  [phone]    Transthoracic Echocardiogram    Patient: Dale Weaver  MRN: [id number]  ACCT #: [id number]  DOB: Oct 28, 1950  Age: 65 years  Gender: Male  Height: 71 in  Weight: 213.6 lb  BSA: 2.17 m squared  BP: 152 / 99 mmHg  Study date: 26-Jul-2015  Status: Routine  Location: Echo lab  DMS ACC #: [id number]    Allergies: SHELLFISH CONTAINING PRODUCTS, SULFA (SULFONAMIDE ANTIBIOTICS),  SERTRALINE    Reading Physician: ??Archer L. Sampson Goon, MD   Reading Group: ??Archer L. Sampson Goon, MD  Technologist: ??Zeb Comfort, RCS    SUMMARY:  Left ventricle: Size was normal. Ejection fraction was estimated in the  range of 65 % to 70 %. There were no regional wall motion abnormalities.  Wall thickness was at the upper limits of normal. Doppler parameters were  consistent with abnormal left ventricular relaxation (grade 1 diastolic  dysfunction).    Left atrium: The atrium was mildly dilated.    Atrial septum: The septum bows from right to left, consistent with  increased right atrial pressure.    Right atrium: There was a probable, 2x1 cm, large, pedunculated,  echo-dense mass within RA chamber adjacent to superolateral wall. Cannot  r/o 2 attachment points at junction corner of superior IAS and upper RA  wall and lower mid IAS. Cannot exclude an extrinsic mass with shadowing.  MRI and/or TEE recommended if felt clinically indicated. Bubble contrast  study suggested to exclude right > left IAS shunting.    Aortic valve: There was mild regurgitation.    Tricuspid valve: There was mild regurgitation. Pulmonary artery systolic  pressure: 35 mmHg. There was mild pulmonary hypertension.    Pulmonic valve: There was trivial regurgitation.    Aorta, systemic arteries: The root exhibited mild dilatation.    Summary Measurements  2D measurements:  Left Atrium: ?? Ao Diam was 3.5 cm. ??LA Diam was 3.4 cm. ??LVOT Diam was 2.2  cm. Left Ventricle: ?? %FS was 25.9 %. ??EDV(Teich) was 78 ml. ??EF(Teich)  was 51.3 %. ??ESV(Teich) was 38 ml. ??IVSd was 1.1 cm. ??LVEDV MOD A4C was  100 ml. ??LVEF MOD A4C was 31.3 %. ??LVESV MOD A4C was 68.7 ml. ??LVIDd was  4.2 cm. ??LVIDs was 3.1 cm. ??LVLd A4C was 8.8 cm. ??LVLs A4C was 6.8 cm.   LVPWd was 1.5 cm. ??SV MOD A4C was 31.3 ml. ??SV(Teich) was 40 ml.  Unspecified Anatomy: ?? AVA Planimetry was 3.4 cm2. ??AVAI Planimetry was 0  cm2/m2. ??LAAs A4C was 20 cm2. ??LAESV A-L A4C was 60.1 ml. ??LAESV MOD A4C   was 54.3 ml. ??LALs A4C was 5.7 cm. ??MVA Planimetry was 9.3 cm2. ??RAAd was  23.8 cm2. ??RAAs was 41.5 cm2. ??RAEDV A-L was 87.9 ml. ??RAEDV MOD was 84.1  ml. ??RAESV A-L was 209.3 ml. ??RAESV MOD was 187.9 ml. ??RALd was 5.5 cm.   RALs was 7 cm.  CW measurements:  Aortic Valve: ?? AR Dec Slope was 2.8 m/s2. ??AR Dec Time was 2011.2 ms. ??AR  PHT was 583.2 ms. ??AR Vmax was 4.7 m/s. ??AR maxPG was 87.7 mmHg. Mitral  Valve: ?? MV VTI was 15.6 cm. ??MV Vmax was 0.8 m/s. ??MV Vmean was 0.5 m/s.   MV maxPG was 2.3 mmHg. ??MV meanPG was 1 mmHg.  Tricuspid Valve: ?? TR Vmax was 2.6 m/s. ??TR maxPG was 26.6 mmHg.  Unspecified Anatomy: ?? RAP was 10 mmHg.  PW measurements:  Left Atrium: ?? LVOT Vmax was 0.7 m/s. ??LVOT maxPG was 2.1 mmHg.  Mitral Valve: ?? MV A Vel was 0.6 m/s. ??MV Dec Slope was 1.8 m/s2. ??MV DecT  was 188.3 ms. ??MV E Vel was 0.3 m/s. ??MV E/A Ratio was 0.6 . Pulmonic  Valve: ?? PR Dec Slope was 1.6 m/s2. ??PR DecT was 752.8 ms. ??PR PHT was  218.3 ms. ??PR Vmax was 1.2 m/s. ??  PR maxPG was 5.8 mmHg. ??PV Vmax was 0.6  m/s. ??PV maxPG was 1.6 mmHg.  Tricuspid Valve: ?? RVSP was 36.6 mmHg.  Unspecified Anatomy: ?? Lateral E' was 0.1 m/s. ??Lateral E/E' was 5.5 .   Septal E' was 0.1 m/s. ??Septal E/E' was 4.7 .    INDICATIONS: Evaluate for suspected cardiac source of embolism. Assess  left ventricular function.    PROCEDURE: This was a routine study. The study included complete 2D  imaging, M-mode, complete spectral Doppler, and color Doppler. The heart  rate was 73 bpm, at the start of the study. Systolic blood pressure was  152 mmHg, at the start of the study. Diastolic blood pressure was 99 mmHg,  at the start of the study. Images were obtained from the parasternal,  apical, subcostal, and suprasternal notch acoustic windows. Image quality  was good.    LEFT VENTRICLE: Size was normal. Ejection fraction was estimated in the  range of 65 % to 70 %. There were no regional wall motion abnormalities.   Wall thickness was at the upper limits of normal. DOPPLER: Doppler  parameters were consistent with abnormal left ventricular relaxation  (grade 1 diastolic dysfunction).    RIGHT VENTRICLE: The size was normal. Systolic function was normal. Wall  thickness was normal.    LEFT ATRIUM: The atrium was mildly dilated.    ATRIAL SEPTUM: The septum bows from right to left, consistent with  increased right atrial pressure.    RIGHT ATRIUM: Size was normal. There was a probable, 2x1 cm, large,  pedunculated, echo-dense mass within RA chamber adjacent to superolateral  wall. Cannot r/o 2 attachment points at junction corner of superior IAS  and upper RA wall and lower mid IAS. Cannot exclude an extrinsic mass with  shadowing. MRI and/or TEE recommended if felt clinically indicated. Bubble  contrast study suggested to exclude right > left IAS shunting.    MITRAL VALVE: There was mild thickening. 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 DOPPLER: The transmitral  velocity was within the normal range.    AORTIC VALVE: The valve was trileaflet. Leaflets exhibited mildly  increased thickness. DOPPLER: Transaortic velocity was within the normal  range. There was no stenosis. There was mild regurgitation.    TRICUSPID VALVE: Normal valve structure. DOPPLER: The transtricuspid  velocity was within the normal range. There was mild regurgitation.  Pulmonary artery systolic pressure: 35 mmHg. There was mild pulmonary  hypertension.    PULMONIC VALVE: Not well visualized. DOPPLER: The transpulmonic velocity  was within the normal range. There was no stenosis. There was trivial  regurgitation.    AORTA: The root exhibited mild dilatation.    PERICARDIUM: There was no pericardial effusion.    SYSTEM MEASUREMENT TABLES    2D  Ao Diam: 3.5 cm  LA Diam: 3.4 cm  LVOT Diam: 2.2 cm  %FS: 25.9 %  EDV(Teich): 78 ml  EF(Teich): 51.3 %  ESV(Teich): 38 ml  IVSd: 1.1 cm  LVEDV MOD A4C: 100 ml  LVEF MOD A4C: 31.3 %   LVESV MOD A4C: 68.7 ml  LVIDd: 4.2 cm  LVIDs: 3.1 cm  LVLd A4C: 8.8 cm  LVLs A4C: 6.8 cm  LVPWd: 1.5 cm  SV MOD A4C: 31.3 ml  SV(Teich): 40 ml  AVA Planimetry: 3.4 cm2  AVAI Planimetry: 0 cm2/m2  LAAs A4C: 20 cm2  LAESV A-L A4C: 60.1 ml  LAESV MOD A4C: 54.3 ml  LALs A4C: 5.7 cm  MVA Planimetry: 9.3 cm2  RAAd:  23.8 cm2  RAAs: 41.5 cm2  RAEDV A-L: 87.9 ml  RAEDV MOD: 84.1 ml  RAESV A-L: 209.3 ml  RAESV MOD: 187.9 ml  RALd: 5.5 cm  RALs: 7 cm    CW  AR Dec Slope: 2.8 m/s2  AR Dec Time: 2011.2 ms  AR PHT: 583.2 ms  AR Vmax: 4.7 m/s  AR maxPG: 87.7 mmHg  MV VTI: 15.6 cm  MV Vmax: 0.8 m/s  MV Vmean: 0.5 m/s  MV maxPG: 2.3 mmHg  MV meanPG: 1 mmHg  TR Vmax: 2.6 m/s  TR maxPG: 26.6 mmHg  RAP: 10 mmHg    PW  LVOT Vmax: 0.7 m/s  LVOT maxPG: 2.1 mmHg  MV A Vel: 0.6 m/s  MV Dec Slope: 1.8 m/s2  MV DecT: 188.3 ms  MV E Vel: 0.3 m/s  MV E/A Ratio: 0.6  PR Dec Slope: 1.6 m/s2  PR DecT: 752.8 ms  PR PHT: 218.3 ms  PR Vmax: 1.2 m/s  PR maxPG: 5.8 mmHg  PV Vmax: 0.6 m/s  PV maxPG: 1.6 mmHg  RVSP: 36.6 mmHg  Lateral E': 0.1 m/s  Lateral E/E': 5.5  Septal E': 0.1 m/s  Septal E/E': 4.7    Prepared and E-signed by    Fernande Boyden L. Sampson Goon, MD  Signed 27-Jul-2015 12:52:51    Addendum Date: 07/27/2015 5:36:36 PM  The previously interpreted 2x1 probable large 2x1 cm pedunculated  echo-dense mass is corrected to difficult to quantitate 5-6 x 1-1.5 cm  large pedunculated mass within right atrium. The remainder of  interpretation unchanged.    Signed: Janyth Pupa. Villalba    Addendum entered by: Fernande Boyden L. Sampson Goon, MD       LimitedTransthoracic Echocardiogram    Patient: Dale Weaver  MRN: [id number]  ACCT #: [id number]  DOB: 01-24-1951  Age: 72 years  Gender: Male  Height:  Weight:  BSA:  BP: 142 / 86 mmHg  Study date: 27-Jul-2015  Status: Routine  Location: Bedside  DMS ACC #: [id number]    Allergies: SHELLFISH CONTAINING PRODUCTS, SULFA (SULFONAMIDE ANTIBIOTICS),  SERTRALINE    Reading Physician: ??Rayford Halsted, MD   Nurse: ??Mikey Kirschner, RN  Reading Group: ??Archer L. Sampson Goon, MD  Technologist: ??Zeb Comfort, RCS    SUMMARY:  Left ventricle: Not evaluated but appeared to be normal. Not evaluated.    INDICATIONS: Assess right atrial size, probable mass and possible right >  left shunt via saline bubble study    PROCEDURE: This was a routine study. The study included limited 2D  imaging, M-mode, and limited spectral Doppler. The heart rate was 74 bpm,  at the start of the study. Systolic blood pressure was 142 mmHg, at the  start of the study. Diastolic blood pressure was 86 mmHg, at the start of  the study. Intravenous contrast (agitated saline, 20) was administered.  Image quality was good.    LEFT VENTRICLE: Not evaluated but appeared normal. Study performed  primarily to assess RA mass and integrity of IAS with saline bubble study.  Not evaluated but appeared to be normal. Not evaluated.    RIGHT VENTRICLE: Not obtained--only bubble study Not evaluated.    LEFT ATRIUM: Size was at the upper limits of normal.    RIGHT ATRIUM: Study was primarily performed to assess extent of right  atrial mass and exclude right to left shunt utilizing saline bubble  contrast. Prior to admintration of contrast, there appeared to be  difficult to quantitate linear, irregular, 5-6 x1-1.5 cm echo-dense  possibly calcified mass  transecting RA diagonally from medial superior  apex at corner of IAS to lower lateral base at tricuspid annulus.  Following administration of saline bubbles, there appeared to be a  echo-lucent cap beneath the echo-dense linear mass measuring 2.5 cm at its  thickest down to 1.0 cm at its narrowest point extending from the base of  lateral wall near the lateral TV annulus up superior along the lateral  aspect of RA wall extending around the apex down along the IAS rendering  the right atrial cavity smaller than normal(possible organizing thrombus,   infiltrative tumor,doubtful myxoma). There appears to be no evidence of  right> left shunting. MRI and/or TEE suggested for further clarification  if felt clinically indicated.    MITRAL VALVE: There was mild-moderate thickening. 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 DOPPLER: Not evaluated    AORTIC VALVE: Not evaluated    TRICUSPID VALVE: Not evaluated    PULMONIC VALVE: Not evaluated    AORTA: Not evaluated    PERICARDIUM: Not evaluated    Prepared and E-signed by    Fernande Boyden L. Sampson Goon, MD  Signed 27-Jul-2015 18:18:41      Data Reviewed:   CMP:   Lab Results   Component Value Date/Time    NA 145 02/29/2016 02:51 PM    K 4.4 02/29/2016 02:51 PM    CL 109 (H) 02/29/2016 02:51 PM    CO2 31 02/29/2016 02:51 PM    AGAP 5 02/29/2016 02:51 PM    GLU 102 (H) 02/29/2016 02:51 PM    BUN 14 02/29/2016 02:51 PM    CREA 0.96 02/29/2016 02:51 PM    GFRAA >60 02/29/2016 02:51 PM    GFRNA >60 02/29/2016 02:51 PM    CA 8.4 (L) 02/29/2016 02:51 PM    ALB 3.2 (L) 02/29/2016 02:51 PM    TP 6.0 (L) 02/29/2016 02:51 PM    GLOB 2.8 02/29/2016 02:51 PM    AGRAT 1.1 02/29/2016 02:51 PM    SGOT 12 (L) 02/29/2016 02:51 PM    ALT 9 (L) 02/29/2016 02:51 PM     CBC:   Lab Results   Component Value Date/Time    WBC 4.1 02/29/2016 02:51 PM    HGB 12.8 02/29/2016 02:51 PM    HCT 39.8 02/29/2016 02:51 PM    PLT 125 (L) 02/29/2016 02:51 PM     All Cardiac Markers in the last 24 hours:   Lab Results   Component Value Date/Time    CPK 58 02/29/2016 02:51 PM    CKMB 1.2 02/29/2016 02:51 PM    CKNDX 2.1 02/29/2016 02:51 PM    TROIQ <0.04 02/29/2016 02:51 PM    TNIPOC <0.04 02/29/2016 02:44 PM        Assessment:      Hemodynamically stable 65 yo Veteran with known cardiac hx presenting with chest pain. Initial cardiac biomarkers and EKG are unremarkable, however, his story for chest pain is suspicious for ACS or NSTEMI. Given pt's h/o right atrial mass, and recent hospitalization described by pt for CVA,  cannot rule out possibility of paradoxical thrombo-embolic event v PE. Pt will need observation at the very least but perhaps more likely, this pt will need definitive imaging for recurrent chest pain and to identify cardiac mass. Records not yet obtained from [place] to determine if cardiac MRI was ever done. This imaging modality is not available at this location, nor is cardiac PCI.     Would recommend transfer to higher level of  care.     Discussed with Dr. Sampson Goon, who also evaluated pt.    Signed By: Lebron Conners, PA-C     February 29, 2016

## 2016-02-29 NOTE — ED Notes (Signed)
Patient provided with urinal.

## 2016-02-29 NOTE — ED Notes (Signed)
Patient eating meal tray sitting on the side of the bed. No complaints at this time.

## 2016-02-29 NOTE — ED Notes (Signed)
RN walking past patients room. The patient stated "nurse, I want to let you know that I'm having chest pain again". The patient ate 100% of his meal try. MD Posey Pronto notified. Patient will get a repeat ECG.

## 2016-02-29 NOTE — ED Notes (Signed)
Ultrasound machine at the bedside with PCT attempting to start a line.

## 2016-02-29 NOTE — ED Notes (Signed)
[facility] returned call, spoke with MD Posey Pronto. We are waiting on a bed from [facility] at this time. The patient was updated on plan of care.

## 2016-02-29 NOTE — ED Notes (Signed)
Patient is now reporting chest pain. MD Posey Pronto notified. Medication Nitro-Bid was ordered, when pulled from pyxis there is no paper to apply the medication. Pharmacy is closed at this time. [facility] Therapist, occupational) who is currently in ER was asked to get some from the floor.

## 2016-02-29 NOTE — ED Notes (Signed)
Patient has been instructed that they have been given Morphine 2mg IV* which contains opioids, benzodiazepines, or other sedating drugs. Patient is aware that they  will need to refrain from driving or operating heavy machinery after taking this medication.  Patient also instructed that they need to avoid drinking alcohol and using other products containing opioids, benzodiazepines, or other sedating drugs.  Patient verbalized understanding.

## 2016-02-29 NOTE — ED Notes (Signed)
TRANSFER - OUT REPORT:    Verbal report given to [employer] RN(name) on Dale Weaver  being transferred to [facility] 4th Room 120A(unit) for routine progression of care       Report consisted of patient???s Situation, Background, Assessment and   Recommendations(SBAR).     Information from the following report(s) SBAR, ED Summary, Procedure Summary, Intake/Output, MAR and Recent Results was reviewed with the receiving nurse.    Lines:   Peripheral IV 02/29/16 Left External jugular (Active)   Site Assessment Clean, dry, & intact 02/29/2016  5:34 PM   Phlebitis Assessment 0 02/29/2016  5:34 PM   Infiltration Assessment 0 02/29/2016  5:34 PM   Dressing Status Clean, dry, & intact 02/29/2016  5:34 PM   Dressing Type Transparent 02/29/2016  5:34 PM   Hub Color/Line Status Pink;Flushed;Patent 02/29/2016  5:34 PM        Opportunity for questions and clarification was provided.      Patient transported with:   RAA

## 2016-02-29 NOTE — ED Provider Notes (Addendum)
HPI Comments: Dale Weaver is a 65 y.o. Male, with a pertinent PMHx of CHF, MI, CAD, and TIA, who presents ambulatory to the [facility] ED c/o 8/10 L CP last night. Pt reports associated symptoms of nausea, sweating, and a soreness in his arm and shoulder. He reported he had taken NTG which had improved the pain to a 5/10, but he stated the pain returned about 30 minutes ago. He had proceeded to take another NTG dose with minimal improvement. Pt states he was watching TV at the time of the onset. He states he was diagnosed with MI in 2012, CHF 1.5 months ago and reports a stent placement in 2010. Pt had called his cardiology office, who referred him to the closest ED. He reports he usually does not experience any CP. Pt specifically denies SOB, lightheadednes, dizziness, vomiting, or diarrhea. Of note, pt reports he was admitted last week for a TIA and was prescribed TPA. He further states he has a caretaker at baseline.     Social hx: - Tobacco use, - EtOH use, - Illicit drug use    PCP: Phys Other, MD  Cardiology: [facility]    There are no other complaints, changes or physical findings at this time.      The history is provided by the patient. No language interpreter was used.        Past Medical History:   Diagnosis Date   ??? Back pain     chronic   ??? CAD (coronary artery disease)     MI 2008   ??? Heart failure (HCC)     CHF   ??? Hiatal hernia    ??? Parkinson's disease ([place])    ??? Psychiatric disorder     anxiety   ??? TIA (transient ischemic attack) 01/2014       Past Surgical History:   Procedure Laterality Date   ??? CARDIAC SURG PROCEDURE UNLIST      cardiac cath August 2009   ??? CARDIAC SURG PROCEDURE UNLIST  2010    x 1 stent, cardiologist @ [place]   ??? [place]      insertion of loop recorder removed 06/2014   ??? HX APPENDECTOMY     ??? HX CHOLECYSTECTOMY     ??? HX HERNIA REPAIR     ??? HX ORTHOPAEDIC      back surgery x4, last June 2010         Family History:    Problem Relation Age of Onset   ??? Stroke Father    ??? Hypertension Mother        Social History     Social History   ??? Marital status: WIDOWED     Spouse name: N/A   ??? Number of children: N/A   ??? Years of education: N/A     Occupational History   ??? Not on file.     Social History Main Topics   ??? Smoking status: Never Smoker   ??? Smokeless tobacco: Not on file   ??? Alcohol use No   ??? Drug use: No   ??? Sexual activity: Not on file     Other Topics Concern   ??? Not on file     Social History Narrative         ALLERGIES: Seafood [shellfish containing products]; Sulfa (sulfonamide antibiotics); and Zoloft [sertraline]    Review of Systems   Constitutional: Positive for diaphoresis. Negative for chills and fever.   HENT: Negative  for congestion, rhinorrhea, sneezing and sore throat.    Eyes: Negative for redness and visual disturbance.   Respiratory: Negative for shortness of breath.    Cardiovascular: Positive for chest pain (+L). Negative for leg swelling.   Gastrointestinal: Positive for nausea. Negative for abdominal pain, diarrhea and vomiting.   Genitourinary: Negative for difficulty urinating and frequency.   Musculoskeletal: Positive for myalgias (+arm/shoulder). Negative for back pain and neck stiffness.   Skin: Negative for rash.   Neurological: Negative for dizziness, syncope, weakness, light-headedness and headaches.   Hematological: Negative for adenopathy.     Patient Vitals for the past 12 hrs:   Temp Pulse Resp BP SpO2   02/29/16 1900 - 78 17 107/88 -   02/29/16 1830 - (!) 57 13 108/66 -   02/29/16 1800 - 61 13 103/62 -   02/29/16 1730 - (!) 58 21 120/69 -   02/29/16 1700 - 64 26 134/83 -   02/29/16 1630 - (!) 58 17 121/80 -   02/29/16 1600 - 68 18 112/68 -   02/29/16 1531 - 62 21 108/67 -   02/29/16 1428 - - - 108/67 -   02/29/16 1421 - 77 (!) 31 - -   02/29/16 1420 - - - 108/67 -   02/29/16 1415 98 ??F (36.7 ??C) 68 30 - 95 %            Physical Exam    Constitutional: He is oriented to person, place, and time. He appears well-developed and well-nourished.   HENT:   Head: Normocephalic and atraumatic.   Mouth/Throat: Oropharynx is clear and moist and mucous membranes are normal.   Eyes: EOM are normal.   Neck: Normal range of motion and full passive range of motion without pain. Neck supple.   Cardiovascular: Normal rate, regular rhythm, normal heart sounds, intact distal pulses and normal pulses.    No murmur heard.  Pulmonary/Chest: Effort normal and breath sounds normal. No respiratory distress. He exhibits no tenderness.   Abdominal: Soft. Normal appearance and bowel sounds are normal. There is no tenderness. There is no rebound and no guarding.   Neurological: He is alert and oriented to person, place, and time. He has normal strength.   Skin: Skin is warm, dry and intact. No rash noted. No erythema.   Bruise on L bicep   Psychiatric: He has a normal mood and affect. His speech is normal and behavior is normal. Judgment and thought content normal.   Nursing note and vitals reviewed.       MDM  Number of Diagnoses or Management Options  Chest pain, unspecified type:   Diagnosis management comments: DDx: Unstable angina, ACS       Amount and/or Complexity of Data Reviewed  Clinical lab tests: ordered and reviewed  Tests in the radiology section of CPT??: ordered and reviewed  Tests in the medicine section of CPT??: ordered and reviewed  Review and summarize past medical records: yes  Discuss the patient with other providers: yes (Cardiology)  Independent visualization of images, tracings, or specimens: yes    Risk of Complications, Morbidity, and/or Mortality  Presenting problems: high  Diagnostic procedures: high  Management options: high  General comments: Total critical care time: 85 minutes      ED Course       Procedures    EKG interpretation: (Preliminary): 1415  Rhythm: normal sinus rhythm; and regular . Rate (approx.): 67 bpm; Axis:  normal; PR interval: normal; QRS interval: normal ;  ST/T wave: normal.  This note is prepared by Betsey Amen, acting as Scribe for Luther Hearing, MD.      CONSULT NOTE:  3:50 PM  Luther Hearing, MD spoke with Lebron Conners, PA,  Specialty: Cardiology  Discussed patient's hx, disposition, and available diagnostic and imaging results.  Reviewed care plans.  Consultant agrees with plans as outlined. Schnurman, PA has evaluated the pt. Plan is to admit to further assess the source of his CP. She will consult with Dr. Sampson Goon and further inform us of the plan.      PROGRESS NOTE:  4:03 PM  Pt has been re-evaluated. Schnurman, PA reports Dr. Sampson Goon will call Luther Hearing, MD. She states the pt would likely be transferred to [facility].       PROGRESS NOTE:  4:07 PM  Pt has been re-evaluated. Per nursing staff, the pt reports his pain is currently a 6/10.      CONSULT NOTE:  4:21 PM  Luther Hearing, MD spoke with Dr. Sampson Goon,  Specialty: Cardiology  Discussed patient's hx, disposition, and available diagnostic and imaging results.  Reviewed care plans.  Consultant agrees with plans as outlined. Dr. Sampson Goon recommends we transfer the pt to [facility].      PROGRESS NOTE:  4:38 PM  Discussed the plan with the pt and he is in agreement with the transfer to [facility]. He will notify his caregiver. Will notify the pt of any changes to the plan.      CONSULT NOTE:  4:39 PM  Luther Hearing, MD spoke with Lora Havens, NP,  Specialty: [facility]  Discussed patient's hx, disposition, and available diagnostic and imaging results.  Reviewed care plans.  Consultant agrees with plans as outlined. The consultant recommends a repeat troponin to assess whether the pt would be admitted to the unit or the floor.    SIGN OUT:  4:50 PM  Patient's presentation, labs/imaging and plan of care was reviewed with Bedelia Person, MD as part of sign out.  They will re-page the [facility] transfer  center after repeat Tn returns, as part of the plan discussed with the patient.    Bedelia Person, MD's assistance in completion of this plan is greatly appreciated but it should be noted that I will be the provider of record for this patient.  Luther Hearing, MD    Progress Note:  4:54 PM  Both attending physicians at bedside to discuss transfer of care and transfer plans. Addressed patient's questions. Patient awaiting repeat Tn and placement at [facility].   Written by Loma Sousa, ED Scribe, as dictated by Bedelia Person, MD.     Consult Note:  5:16 PM  Bedelia Person, MD spoke with Dr. Baird Cancer,  Specialty: cardiology  Discussed pt's hx, disposition, and available diagnostic and imaging results. Reviewed care plans. Consultant agrees with plans as outlined. Dr. Sampson Goon suggest that the pt receives a cardiac cath once admitted to [facility].  Written by Loma Sousa, ED Scribe, as dictated by Bedelia Person, MD.     Progress Note:  5:17 PM  Tech states pt does not have a usable PIV. PA-C Etheridge to place EJ IV.   Written by Loma Sousa, ED Scribe, as dictated by Bedelia Person, MD.    Procedure Note- Peripheral IV Access  5:20 PM  Performed by: Lenon Curt   PA-C Etheridge gained IV access using  20 gauge needle because the patient had no vascular  access.  After cleaning the site with alcohol prep, the Left EJ vein was localized without ultrasound guidance in an anterior approach.  Line confirmation was obtained by direct visualization and good blood return. No anaesthetic was used.  The line was successfully flushed with normal saline and was secured with transparent tape.  Estimated blood loss: none  The procedure took 1-15 minutes, and pt tolerated well.  Written by Loma Sousa, ED Scribe, as dictated by Norval Gable.    Consult Note:  6:36 PM  Bedelia Person, MD spoke with Dr. Edwyna Shell,  Specialty: cardiology  Discussed pt's hx, disposition, and available diagnostic and imaging  results. Reviewed care plans. Consultant agrees with plans as outlined.   Written by Loma Sousa, ED Scribe, as dictated by Bedelia Person, MD.      Progress Note:  7:30 PM   Pt c/o chest pain s/p PO intake. ekg ordered. Patient still awaiting bed placement.  Written by Loma Sousa, ED Scribe, as dictated by Bedelia Person, MD.       EKG interpretation: (Preliminary) 19:34  Rhythm: normal sinus rhythm; and regular . Rate (approx.): 61; Axis: normal; PR interval: normal; QRS interval: normal ; ST/T wave: normal; pther findings: normal.  Written by Loma Sousa, ED Scribe, as dictated by Bedelia Person, MD.        LABORATORY TESTS:  Recent Results (from the past 12 hour(s))   POC TROPONIN-I    Collection Time: 02/29/16  2:44 PM   Result Value Ref Range    Troponin-I (POC) <0.04 0.00 - 0.08 ng/mL   CBC WITH AUTOMATED DIFF    Collection Time: 02/29/16  2:51 PM   Result Value Ref Range    WBC 4.1 4.1 - 11.1 K/uL    RBC 4.06 (L) 4.10 - 5.70 M/uL    HGB 12.8 12.1 - 17.0 g/dL    HCT 39.8 36.6 - 50.3 %    MCV 98.0 80.0 - 99.0 FL    MCH 31.5 26.0 - 34.0 PG    MCHC 32.2 30.0 - 36.5 g/dL    RDW 12.9 11.5 - 14.5 %    PLATELET 125 (L) 150 - 400 K/uL    NEUTROPHILS 57 32 - 75 %    LYMPHOCYTES 26 12 - 49 %    MONOCYTES 13 5 - 13 %    EOSINOPHILS 4 0 - 7 %    BASOPHILS 0 0 - 1 %    ABS. NEUTROPHILS 2.3 1.8 - 8.0 K/UL    ABS. LYMPHOCYTES 1.1 0.8 - 3.5 K/UL    ABS. MONOCYTES 0.5 0.0 - 1.0 K/UL    ABS. EOSINOPHILS 0.2 0.0 - 0.4 K/UL    ABS. BASOPHILS 0.0 0.0 - 0.1 K/UL   METABOLIC PANEL, COMPREHENSIVE    Collection Time: 02/29/16  2:51 PM   Result Value Ref Range    Sodium 145 136 - 145 mmol/L    Potassium 4.4 3.5 - 5.1 mmol/L    Chloride 109 (H) 97 - 108 mmol/L    CO2 31 21 - 32 mmol/L    Anion gap 5 5 - 15 mmol/L    Glucose 102 (H) 65 - 100 mg/dL    BUN 14 6 - 20 MG/DL    Creatinine 0.96 0.70 - 1.30 MG/DL    BUN/Creatinine ratio 15 12 - 20      GFR est AA >60 >60 ml/min/1.40m    GFR est non-AA >60 >60 ml/min/1.760m  Calcium 8.4 (L) 8.5 - 10.1 MG/DL    Bilirubin, total 0.5 0.2 - 1.0 MG/DL    ALT (SGPT) 9 (L) 12 - 78 U/L    AST (SGOT) 12 (L) 15 - 37 U/L    Alk. phosphatase 98 45 - 117 U/L    Protein, total 6.0 (L) 6.4 - 8.2 g/dL    Albumin 3.2 (L) 3.5 - 5.0 g/dL    Globulin 2.8 2.0 - 4.0 g/dL    A-G Ratio 1.1 1.1 - 2.2     TROPONIN I    Collection Time: 02/29/16  2:51 PM   Result Value Ref Range    Troponin-I, Qt. <0.04 <0.05 ng/mL   CK W/ CKMB & INDEX    Collection Time: 02/29/16  2:51 PM   Result Value Ref Range    CK 58 39 - 308 U/L    CK - MB 1.2 <3.6 NG/ML    CK-MB Index 2.1 0.0 - 2.5     TROPONIN I    Collection Time: 02/29/16  5:15 PM   Result Value Ref Range    Troponin-I, Qt. <0.04 <0.05 ng/mL       IMAGING RESULTS:  CXR Results  (Last 48 hours)               02/29/16 1502  XR CHEST PORT Final result    Impression:  IMPRESSION: No acute findings               Narrative:  EXAM:  XR CHEST PORT       INDICATION:  Chest Pain       COMPARISON:  March 28       FINDINGS: A portable AP radiograph of the chest was obtained at 1455 hours. The   patient is on a cardiac monitor. There is elevation of the right hemidiaphragm.   The lungs are clear.  The cardiac and mediastinal contours and pulmonary   vascularity are normal.  The bones and soft tissues are grossly within normal   limits. The patient is status post cervical spine surgery.                 MEDICATIONS GIVEN:  Medications   saline peripheral flush soln 10 mL (10 mL IntraVENous Given 02/29/16 1502)   morphine injection 2 mg (2 mg IntraVENous Given 02/29/16 1502)   aspirin chewable tablet 324 mg (324 mg Oral Given 02/29/16 1442)       IMPRESSION:  1. Chest pain, unspecified type        PLAN:  1. Transfer to [facility]    TRANSFER NOTE:  4:50 PM  Patient is being transferred to Cardiology inpatient floor at [facility], transfer accepted by Manuella Ghazi, NP.  The reasons for patient's transfer have been discussed with the patient and available family.  They  convey agreement and understanding for the need to be transferred as explained to them by Luther Hearing, MD ED Provider.    ATTESTATION:  This note is prepared by Betsey Amen, acting as Scribe for Luther Hearing, MD.    Luther Hearing, MD: The scribe's documentation has been prepared under my direction and personally reviewed by me in its entirety. I confirm that the note above accurately reflects all work, treatment, procedures, and medical decision making performed by me.

## 2016-02-29 NOTE — ED Notes (Signed)
Patient lying resting in bed. No complaints.

## 2016-02-29 NOTE — ED Notes (Addendum)
Emergency Department Nursing Plan of Care       The Nursing Plan of Care is developed from the Nursing assessment and Emergency Department Attending provider initial evaluation.  The plan of care may be reviewed in the ???ED Provider note???.    The Plan of Care was developed with the following considerations:   Patient / Family readiness to learn indicated [id number] understanding  Persons(s) to be included in education: patient  Barriers to Learning/Limitations:No    [place], RN    02/29/2016   2:22 PM    See Assessment

## 2016-02-29 NOTE — Consults (Signed)
Consults by Lebron Conners, PA-C at 02/29/16 1630                Author: Lebron Conners, PA-C  Service: Cardiology  Author Type: Physician Assistant       Filed: 02/29/16 1712  Date of Service: 02/29/16 1630  Status: Attested           Editor: Lebron Conners, PA-C (Physician Assistant)  Cosigner: Baird Cancer L at 02/29/16 2226            Consult Orders        1. IP CONSULT TO CARDIOLOGY [id number] ordered by Luther Hearing, MD at 02/29/16 1516                         Attestation signed by Baird Cancer L at 02/29/16 2226          Patient seen and examined. All pertinent data reviewed. I have reviewed detailed note as outlined by Bebe Shaggy. Case discussed with Nursing/medical  assistant staff and Bebe Shaggy. Patient presents with progressive recurrent precordial chest pain similar to chest pain associated MI 2012 at time he received PCI/stent. Is partially responsive to SL NTG. Despite negative troponins x2 and  unremarkable EKG and negative NST in 12/16 and 3-4 months ago followed by episode of CHF [facility], cannot exclude significant CAD. Given presentation, believe we should transfer to [facility] for higher acuity level of critical care where he was just recently discharged  from for strong consideration for cardiac cath to which patient agrees. Discussed with ED MD's who will initiate transfer.                                       Consult          Patient: Dale Weaver  MRN: [id number]   SSN: [id number]          Date of Birth: 05/21/1951   Age: 65 y.o.   Sex: male            Subjective:         Date of  Admission: 02/29/2016       Admission type: Emergency       This consultation was requested by Dr. Georgina Pillion, ER Attending.      Dale Weaver is a 65 y.o. 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      Patient complains of chest pressure/discomfort, dyspnea, diaphoresis and nausea, with radiation of pain to the left arm. Pain began yesterday while pt was watching T.V. He took 2 SL NTG which helped the pain and sx resolved.  Pain - which pt describes as pressure in his left substernal area - and dyspnea returned this afternoon. He again took 2 SL NTG but this did not resolve his sx so he sought ER attention. Pt is a veteran and has been following up with the [place] for cardiac  care since we last saw him in December 2017. Pt states he called his doctor at the [place] who advised him to go to the nearest ER rather than to drive to the [place].  Pt was treated for CHF sx 1 month ago. He also disclosed to ER attending that he was treated at [facility] 1 week ago for CVA and received tPA.      Pt was to have cardiac MRI to evaluate right atrial cardiac mass, which he wanted to do through the [place] d/t cost issues. Pt states he believes this was done.        Cardiac risk factors: dyslipidemia, male gender, hypertension.      Primary Care Provider: Phys Other, MD     Past Medical History:        Diagnosis  Date         ?  Back pain            chronic         ?  CAD (coronary artery disease)            MI 2008         ?  Heart failure (HCC)            CHF         ?  Hiatal hernia       ?  Parkinson's disease ([place])       ?  Psychiatric disorder            anxiety         ?  TIA (transient ischemic attack)  01/2014           Past Surgical History:         Procedure  Laterality  Date          ?  CARDIAC SURG PROCEDURE UNLIST              cardiac cath August 2009          ?  CARDIAC SURG PROCEDURE UNLIST    2010          x 1 stent, cardiologist @ [place]          ?  CARDIAC SURG PROCEDURE UNLIST              insertion of loop recorder removed 06/2014          ?  HX APPENDECTOMY         ?  HX CHOLECYSTECTOMY         ?  HX HERNIA REPAIR         ?  HX ORTHOPAEDIC              back surgery x4,  last June 2010          Family History         Problem  Relation  Age of Onset          ?  Stroke  Father            ?  Hypertension  Mother             Social History       Substance Use Topics         ?  Smoking status:  Never Smoker     ?  Smokeless tobacco:  Not on file         ?  Alcohol use  No           Current Facility-Administered Medications          Medication  Dose  Route  Frequency           ?  saline peripheral flush soln 10 mL   10 mL  IntraVENous  PRN          Current Outpatient Prescriptions        Medication  Sig         ?  busPIRone (BUSPAR) 10 mg tablet  Take 10 mg by mouth three (3) times daily.     ?  atorvastatin (LIPITOR) 80 mg tablet  Take 40 mg by mouth daily.     ?  QUEtiapine (SEROQUEL) 100 mg tablet  Take 50 mg by mouth two (2) times a day.     ?  tiZANidine (ZANAFLEX) 4 mg capsule  Take 4 mg by mouth three (3) times daily.     ?  albuterol (PROVENTIL HFA, VENTOLIN HFA, PROAIR HFA) 90 mcg/actuation inhaler  Take 2 Puffs by inhalation every four (4) hours as needed for Wheezing.     ?  gabapentin (NEURONTIN) 300 mg capsule  Take 600 mg by mouth every eight (8) hours.     ?  carbidopa 25 mg tab  Take 50 mg by mouth three (3) times daily.     ?  clonazePAM (KLONOPIN) 1 mg tablet  Take 0.5 mg by mouth three (3) times daily.     ?  tamsulosin (FLOMAX) 0.4 mg capsule  Take 0.4 mg by mouth nightly.     ?  miconazole (MICOTIN) 2 % topical powder  Apply  to affected area daily. Apply to affected area     ?  carbidopa-levodopa (SINEMET) 25-100 mg per tablet  Take 1 Tab by mouth three (3) times daily.     ?  FLUoxetine (PROZAC) 20 mg tablet  Take 20 mg by mouth daily.     ?  omeprazole (PRILOSEC) 20 mg capsule  Take 20 mg by mouth two (2) times a day.     ?  cyanocobalamin (VITAMIN B12) 1,000 mcg/mL injection  1,000 mcg by IntraMUSCular route every thirty (30) days.         ?  cholecalciferol (VITAMIN D3) 1,000 unit tablet  Take 3,000 Units by mouth daily.              Allergies        Allergen   Reactions         ?  Seafood [Shellfish Containing Products]  Hives             Allergy to fish only.  Can eat shrimp.  No allergy to IV contrast.         ?  Sulfa (Sulfonamide Antibiotics)  Hives         ?  Zoloft [Sertraline]  Itching            Review of Systems:   A comprehensive review of systems was negative except for that written in the History of Present Illness.           Subjective:          Visit Vitals         ?  BP  108/67     ?  Pulse  62     ?  Temp  98 ??F (36.7 ??C)     ?  Resp  21     ?  Ht  6\' 1"  (1.854 m)     ?  Wt  212 lb (96.2 kg)     ?  SpO2  95%         ?  BMI  27.97 kg/m2            Physical Exam:   Neck: supple, symmetrical, trachea midline, no adenopathy, thyroid: not enlarged, symmetric, no tenderness/mass/nodules, no carotid bruit and no JVD   Heart: regular rate and rhythm, S1, S2 normal, no murmur, click, rub or gallop   Lungs: clear to auscultation bilaterally   Abdomen: soft, non-tender. Bowel sounds normal. No masses,  no organomegaly   Extremities: extremities normal, atraumatic, no cyanosis or edema   Pulses: 2+ and symmetric   Neurologic: Grossly normal   GEN: WDNW adult male Veteran, NAD. Normal affect.      Cardiographics:   Telemetry: normal sinus rhythm   ECG: NSR with PACs, leftward axis, no ST segment changes.   Echocardiogram:   07/27/2015 ??5:41 PM - Edi, Card Result In    Narrative      [facility] [place] [address]  [phone]    Transthoracic Echocardiogram    Patient: Dale Weaver  MRN: [id number]   ACCT #: [id number]  DOB: 06/10/1951  Age: 64 years  Gender: Male  Height: 71 in  Weight: 213.6 lb  BSA: 2.17 m squared  BP: 152 / 99 mmHg  Study date: 26-Jul-2015  Status: Routine  Location: Echo lab  DMS  [place] #: [id number]    Allergies: SHELLFISH CONTAINING PRODUCTS, SULFA (SULFONAMIDE ANTIBIOTICS),  SERTRALINE    Reading Physician: ??Archer L. Sampson Goon, MD  Reading Group: ??Archer L. Sampson Goon, MD  Technologist: ??Zeb Comfort, RCS    SUMMARY:  Left ventricle: Size was normal. Ejection fraction was estimated in the  range of 65 % to 70 %. There were no regional wall motion abnormalities.  Wall thickness was at the upper limits of normal. Doppler parameters  were  consistent with abnormal left ventricular relaxation (grade 1 diastolic  dysfunction).    Left atrium: The atrium was mildly dilated.    Atrial septum: The septum bows from right to left, consistent with  increased right  atrial pressure.    Right atrium: There was a probable, 2x1 cm, large, pedunculated,  echo-dense mass within RA chamber adjacent to superolateral wall. Cannot  r/o 2 attachment points at junction corner of superior IAS and upper RA   wall and lower mid IAS. Cannot exclude an extrinsic mass with shadowing.  MRI and/or TEE recommended if felt clinically indicated. Bubble contrast  study suggested to exclude right > left IAS shunting.    Aortic valve: There was mild regurgitation.     Tricuspid valve: There was mild regurgitation. Pulmonary artery systolic  pressure: 35 mmHg. There was mild pulmonary hypertension.    Pulmonic valve: There was trivial regurgitation.    Aorta, systemic arteries: The root exhibited mild  dilatation.    Summary Measurements  2D measurements:  Left Atrium: ?? Ao Diam was 3.5 cm. ??LA Diam was 3.4 cm. ??LVOT Diam was 2.2  cm. Left Ventricle: ?? %FS was 25.9 %. ??EDV(Teich) was 78 ml. ??EF(Teich)  was 51.3  %. ??ESV(Teich) was 38 ml. ??IVSd was 1.1 cm. ??LVEDV MOD A4C was  100 ml. ??LVEF MOD A4C was 31.3 %. ??LVESV MOD A4C was 68.7 ml. ??LVIDd was  4.2 cm. ??LVIDs was 3.1 cm. ??LVLd A4C was 8.8 cm. ??LVLs A4C was 6.8 cm.   LVPWd  was 1.5 cm. ??SV MOD A4C was 31.3 ml. ??SV(Teich) was 40 ml.  Unspecified Anatomy: ?? AVA Planimetry was 3.4 cm2. ??AVAI Planimetry was 0  cm2/m2. ??LAAs  A4C was 20 cm2. ??LAESV A-L A4C was 60.1 ml. ??LAESV MOD A4C  was 54.3  ml. ??LALs A4C was 5.7 cm. ??MVA Planimetry was 9.3 cm2. ??RAAd was  23.8 cm2. ??RAAs was 41.5 cm2. ??RAEDV  A-L was 87.9 ml. ??RAEDV MOD was 84.1  ml. ??RAESV A-L was 209.3 ml. ??RAESV MOD was 187.9 ml. ??RALd was 5.5 cm.    RALs was 7 cm.  CW measurements:  Aortic Valve: ?? AR Dec Slope was 2.8 m/s2. ??AR Dec Time was 2011.2 ms. ??AR  PHT was 583.2 ms. ??AR Vmax was 4.7 m/s. ??AR maxPG was 87.7 mmHg. Mitral  Valve: ?? MV VTI was 15.6 cm. ??MV  Vmax was 0.8 m/s. ??MV Vmean was 0.5 m/s.   MV maxPG was 2.3 mmHg. ??MV meanPG was 1 mmHg.  Tricuspid Valve: ?? TR Vmax was 2.6 m/s. ??TR maxPG was 26.6 mmHg.  Unspecified Anatomy: ?? RAP was 10 mmHg.  PW measurements:  Left  Atrium: ?? LVOT Vmax was 0.7 m/s. ??LVOT maxPG was 2.1 mmHg.  Mitral Valve: ?? MV A Vel was 0.6 m/s. ??MV Dec Slope was 1.8 m/s2. ??MV DecT  was 188.3 ms. ??MV E Vel was 0.3 m/s. ??MV E/A Ratio was 0.6 . Pulmonic  Valve: ??  PR Dec Slope was 1.6 m/s2. ??PR DecT was 752.8 ms. ??PR PHT was  218.3 ms. ??PR Vmax was 1.2 m/s. ??PR maxPG was 5.8 mmHg. ??PV Vmax was 0.6  m/s. ??PV maxPG was 1.6 mmHg.  Tricuspid Valve: ?? RVSP was 36.6 mmHg.  Unspecified  Anatomy: ?? Lateral E' was 0.1 m/s. ??Lateral E/E' was 5.5 .   Septal E' was 0.1 m/s. ??Septal E/E' was 4.7 .    INDICATIONS: Evaluate for suspected cardiac source of embolism. Assess  left ventricular function.    PROCEDURE:  This was a routine study. The study included complete 2D  imaging, M-mode, complete spectral Doppler, and color Doppler. The heart  rate was 73 bpm, at the start of the study. Systolic blood pressure was  152 mmHg, at the start of the study.  Diastolic blood pressure was 99 mmHg,  at the start of the study. Images were obtained from the parasternal,  apical, subcostal, and suprasternal notch acoustic windows. Image quality  was good.    LEFT VENTRICLE: Size was normal. Ejection  fraction was estimated in the  range of 65 % to 70 %. There were no regional wall motion abnormalities.  Wall thickness was at the upper limits of normal. DOPPLER: Doppler  parameters were consistent with abnormal left ventricular relaxation   (grade 1  diastolic dysfunction).    RIGHT VENTRICLE: The size was normal. Systolic function was normal. Wall  thickness was normal.    LEFT ATRIUM: The atrium was mildly dilated.    ATRIAL SEPTUM: The septum bows from right to  left, consistent with  increased right atrial pressure.    RIGHT ATRIUM: Size was normal. There was a probable, 2x1 cm, large,  pedunculated, echo-dense mass within RA chamber adjacent to superolateral  wall. Cannot r/o 2 attachment  points at junction corner of superior IAS  and upper RA wall and lower mid IAS. Cannot exclude an extrinsic mass with  shadowing. MRI and/or TEE recommended if felt clinically indicated. Bubble  contrast study suggested to exclude right >  left IAS shunting.    MITRAL VALVE: There was mild thickening. 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 DOPPLER: The transmitral  velocity was within the normal range.  AORTIC VALVE: The valve was trileaflet.  Leaflets exhibited mildly  increased thickness. DOPPLER: Transaortic velocity was within the normal  range. There was no stenosis. There was mild regurgitation.    TRICUSPID VALVE: Normal valve structure. DOPPLER: The transtricuspid   velocity was within the normal range. There was mild regurgitation.  Pulmonary artery systolic pressure: 35 mmHg. There was mild pulmonary  hypertension.    PULMONIC VALVE: Not well visualized. DOPPLER: The transpulmonic velocity  was  within the normal range. There was no stenosis. There was trivial  regurgitation.    AORTA: The root exhibited mild dilatation.    PERICARDIUM: There was no pericardial effusion.    SYSTEM MEASUREMENT TABLES    2D   Ao Diam: 3.5 cm  LA Diam: 3.4 cm  LVOT Diam: 2.2 cm  %FS: 25.9 %  EDV(Teich): 78 ml  EF(Teich): 51.3 %  ESV(Teich): 38 ml  IVSd: 1.1 cm  LVEDV MOD A4C: 100 ml  LVEF MOD A4C: 31.3 %  LVESV MOD A4C: 68.7 ml  LVIDd:  4.2 cm  LVIDs: 3.1 cm  LVLd A4C: 8.8 cm  LVLs A4C: 6.8 cm  LVPWd: 1.5 cm  SV MOD A4C: 31.3 ml  SV(Teich): 40 ml  AVA  Planimetry: 3.4 cm2  AVAI Planimetry: 0 cm2/m2  LAAs A4C: 20 cm2  LAESV A-L A4C: 60.1 ml   LAESV MOD A4C: 54.3 ml  LALs A4C: 5.7 cm  MVA Planimetry: 9.3 cm2  RAAd: 23.8 cm2  RAAs: 41.5 cm2  RAEDV A-L: 87.9 ml  RAEDV MOD: 84.1 ml  RAESV A-L: 209.3 ml  RAESV MOD: 187.9 ml  RALd: 5.5 cm  RALs: 7 cm     CW  AR Dec Slope: 2.8 m/s2  AR Dec Time: 2011.2 ms  AR PHT: 583.2 ms  AR Vmax: 4.7 m/s  AR maxPG: 87.7 mmHg  MV VTI: 15.6 cm  MV Vmax: 0.8 m/s  MV Vmean: 0.5 m/s  MV maxPG: 2.3 mmHg  MV meanPG: 1 mmHg  TR Vmax:  2.6 m/s  TR maxPG: 26.6 mmHg  RAP: 10 mmHg    PW  LVOT Vmax: 0.7 m/s  LVOT maxPG: 2.1 mmHg  MV A Vel: 0.6 m/s  MV Dec Slope: 1.8 m/s2  MV DecT: 188.3 ms  MV E Vel: 0.3 m/s  MV E/A Ratio: 0.6  PR Dec Slope:  1.6 m/s2  PR DecT: 752.8 ms  PR PHT: 218.3 ms  PR Vmax: 1.2 m/s  PR maxPG: 5.8 mmHg  PV Vmax: 0.6 m/s  PV maxPG: 1.6 mmHg  RVSP: 36.6 mmHg  Lateral E': 0.1 m/s  Lateral E/E': 5.5  Septal E': 0.1 m/s  Septal  E/E': 4.7    Prepared and E-signed by    Fernande Boyden L. Sampson Goon, MD  Signed 27-Jul-2015 12:52:51    Addendum Date: 07/27/2015 5:36:36 PM  The previously interpreted 2x1 probable large 2x1 cm pedunculated  echo-dense mass  is corrected to difficult to quantitate 5-6 x 1-1.5 cm  large pedunculated mass within right atrium. The remainder of  interpretation unchanged.    Signed: Janyth Pupa. [place]    Addendum entered by: Fernande Boyden L. Sampson Goon,  MD          LimitedTransthoracic Echocardiogram    Patient: Dale Weaver  MRN: [id number]  ACCT #: [id number]  DOB: [birth year]  Age: 70 years  Gender: Male  Height:  Weight:  BSA:  BP: 142 / 86 mmHg   Study date: 27-Jul-2015  Status: Routine  Location: Bedside  DMS ACC #:  [id number]    Allergies: SHELLFISH CONTAINING PRODUCTS, SULFA (SULFONAMIDE ANTIBIOTICS),  SERTRALINE    Reading Physician: ??Rayford Halsted, MD   Nurse: ??Mikey Kirschner, RN  Reading Group: ??Archer L. Sampson Goon, MD  Technologist: ??Zeb Comfort, RCS    SUMMARY:  Left ventricle: Not  evaluated but appeared to be normal. Not evaluated.    INDICATIONS: Assess right  atrial size, probable mass and possible right >  left shunt via saline bubble study    PROCEDURE: This was a routine study. The study included limited 2D  imaging, M-mode, and limited spectral Doppler. The heart rate was 74 bpm,  at  the start of the study. Systolic blood pressure was 142 mmHg, at the  start of the study. Diastolic blood pressure was 86 mmHg, at the start of  the study. Intravenous contrast (agitated saline, 20) was administered.  Image quality was good.     LEFT VENTRICLE: Not evaluated but appeared normal. Study performed  primarily to assess RA mass and integrity of IAS with saline bubble study.  Not evaluated but appeared to be normal. Not evaluated.    RIGHT VENTRICLE: Not obtained--only  bubble study Not evaluated.    LEFT ATRIUM: Size was at the upper limits of normal.    RIGHT ATRIUM: Study was primarily performed to assess extent of right  atrial mass and exclude right to left shunt utilizing saline bubble  contrast.  Prior to admintration of contrast, there appeared to be  difficult to quantitate linear, irregular, 5-6 x1-1.5 cm echo-dense  possibly calcified mass transecting RA diagonally from medial  superior  apex at corner of IAS to lower lateral base at tricuspid annulus.  Following administration of saline bubbles, there appeared to be a  echo-lucent cap beneath  the echo-dense linear mass measuring 2.5 cm at its  thickest down to 1.0 cm at its narrowest point extending from the base of  lateral wall near the lateral TV annulus up superior along the lateral  aspect of RA wall extending around the apex  down along the IAS rendering  the right atrial cavity smaller than normal(possible organizing thrombus,  infiltrative tumor,doubtful myxoma). There appears to be no evidence of  right> left shunting. MRI and/or TEE suggested  for further clarification  if felt clinically indicated.    MITRAL VALVE: There  was mild-moderate thickening. 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 DOPPLER: Not evaluated    AORTIC VALVE: Not evaluated     TRICUSPID VALVE: Not evaluated    PULMONIC VALVE: Not evaluated    AORTA: Not evaluated    PERICARDIUM: Not evaluated    Prepared and E-signed by    Fernande Boyden L. Sampson Goon, MD  Signed 27-Jul-2015 18:18:41         Data Reviewed:    CMP:      Lab Results         Component  Value  Date/Time            NA  145  02/29/2016 02:51 PM       K  4.4  02/29/2016 02:51 PM       CL  109 (H)  02/29/2016 02:51 PM       CO2  31  02/29/2016 02:51 PM       AGAP  5  02/29/2016 02:51 PM       GLU  102 (H)  02/29/2016 02:51 PM       BUN  14  02/29/2016 02:51 PM  CREA  0.96  02/29/2016 02:51 PM       GFRAA  >60  02/29/2016 02:51 PM       GFRNA  >60  02/29/2016 02:51 PM       CA  8.4 (L)  02/29/2016 02:51 PM       ALB  3.2 (L)  02/29/2016 02:51 PM       TP  6.0 (L)  02/29/2016 02:51 PM       GLOB  2.8  02/29/2016 02:51 PM       AGRAT  1.1  02/29/2016 02:51 PM       SGOT  12 (L)  02/29/2016 02:51 PM            ALT  9 (L)  02/29/2016 02:51 PM        CBC:      Lab Results         Component  Value  Date/Time            WBC  4.1  02/29/2016 02:51 PM       HGB  12.8  02/29/2016 02:51 PM       HCT  39.8  02/29/2016 02:51 PM            PLT  125 (L)  02/29/2016 02:51 PM        All Cardiac Markers in the last 24 hours:      Lab Results         Component  Value  Date/Time            CPK  58  02/29/2016 02:51 PM       CKMB  1.2  02/29/2016 02:51 PM       CKNDX  2.1  02/29/2016 02:51 PM       TROIQ  <0.04  02/29/2016 02:51 PM            TNIPOC  <0.04  02/29/2016 02:44 PM              Assessment:         Hemodynamically stable 65 yo Veteran with known cardiac hx presenting with chest pain. Initial cardiac biomarkers and EKG are unremarkable, however, his story for chest pain is suspicious for ACS or NSTEMI. Given pt's h/o right atrial mass, and recent  hospitalization described by pt for CVA,  cannot rule out possibility of paradoxical thrombo-embolic event v PE. Pt will need observation at the very least but perhaps more likely, this pt will need definitive imaging for recurrent chest pain and to identify  cardiac mass. Records not yet obtained from [place] to determine if cardiac MRI was ever done. This imaging modality is not available at this location, nor is cardiac PCI.       Would recommend transfer to higher level of care.       Discussed with Dr. Sampson Goon, who also evaluated pt.         Signed By:  Lebron Conners, PA-C        February 29, 2016

## 2016-03-01 MED ORDER — NITROGLYCERIN 0.4 MG SUBLINGUAL TAB
0.4 mg | SUBLINGUAL | Status: DC
Start: 2016-03-01 — End: 2016-02-29

## 2016-03-01 MED ORDER — NITROGLYCERIN 2 % TRANSDERMAL OINTMENT
2 % | TRANSDERMAL | Status: AC
Start: 2016-03-01 — End: 2016-02-29
  Administered 2016-03-01: 01:00:00 via TOPICAL

## 2016-03-01 MED FILL — NITRO-BID 2 % TRANSDERMAL OINTMENT: 2 % | TRANSDERMAL | Qty: 1

## 2016-03-02 LAB — EKG 12-LEAD
Atrial Rate: 61 {beats}/min
Atrial Rate: 67 {beats}/min
Diagnosis: NORMAL
P Axis: 44 degrees
P Axis: 46 degrees
P-R Interval: 168 ms
P-R Interval: 182 ms
Q-T Interval: 418 ms
Q-T Interval: 458 ms
QRS Duration: 88 ms
QRS Duration: 96 ms
QTc Calculation (Bazett): 441 ms
QTc Calculation (Bazett): 461 ms
R Axis: -5 degrees
R Axis: 13 degrees
T Axis: 16 degrees
T Axis: 30 degrees
Ventricular Rate: 61 {beats}/min
Ventricular Rate: 67 {beats}/min

## 2016-03-02 LAB — EKG, 12 LEAD, INITIAL
Atrial Rate: 61 {beats}/min
Atrial Rate: 67 {beats}/min
Calculated P Axis: 44 degrees
Calculated P Axis: 46 degrees
Calculated R Axis: -5 degrees
Calculated R Axis: 13 degrees
Calculated T Axis: 16 degrees
Calculated T Axis: 30 degrees
Diagnosis: NORMAL
P-R Interval: 168 ms
P-R Interval: 182 ms
Q-T Interval: 418 ms
Q-T Interval: 458 ms
QRS Duration: 88 ms
QRS Duration: 96 ms
QTC Calculation (Bezet): 441 ms
QTC Calculation (Bezet): 461 ms
Ventricular Rate: 61 {beats}/min
Ventricular Rate: 67 {beats}/min

## 2016-09-05 ENCOUNTER — Emergency Department: Admit: 2016-09-05 | Payer: MEDICARE | Primary: Family Medicine

## 2016-09-05 ENCOUNTER — Inpatient Hospital Stay
Admit: 2016-09-05 | Discharge: 2016-09-05 | Disposition: A | Discharge status: Home / Self Care | Payer: MEDICARE | Attending: Emergency Medicine

## 2016-09-05 ENCOUNTER — Emergency Department

## 2016-09-05 DIAGNOSIS — J209 Acute bronchitis, unspecified: Secondary | ICD-10-CM

## 2016-09-05 LAB — METABOLIC PANEL, BASIC
Anion gap: 9 mmol/L (ref 5–15)
BUN/Creatinine ratio: 15 (ref 12–20)
BUN: 16 MG/DL (ref 6–20)
CO2: 27 mmol/L (ref 21–32)
Calcium: 8.4 MG/DL — ABNORMAL LOW (ref 8.5–10.1)
Chloride: 109 mmol/L — ABNORMAL HIGH (ref 97–108)
Creatinine: 1.08 MG/DL (ref 0.70–1.30)
GFR est AA: >60 mL/min/{1.73_m2} (ref >60)
GFR est non-AA: >60 mL/min/{1.73_m2} (ref >60)
Glucose: 104 mg/dL — ABNORMAL HIGH (ref 65–100)
Potassium: 3.9 mmol/L (ref 3.5–5.1)
Sodium: 145 mmol/L (ref 136–145)

## 2016-09-05 LAB — CBC WITH AUTOMATED DIFF
ABS. BASOPHILS: 0 10*3/uL (ref 0.0–0.1)
ABS. EOSINOPHILS: 0.1 10*3/uL (ref 0.0–0.4)
ABS. LYMPHOCYTES: 0.8 10*3/uL (ref 0.8–3.5)
ABS. MONOCYTES: 0.4 10*3/uL (ref 0.0–1.0)
ABS. NEUTROPHILS: 2.9 10*3/uL (ref 1.8–8.0)
BASOPHILS: 0 % (ref 0–1)
EOSINOPHILS: 2 % (ref 0–7)
HCT: 43.8 % (ref 36.6–50.3)
HGB: 15 g/dL (ref 12.1–17.0)
LYMPHOCYTES: 20 % (ref 12–49)
MCH: 33.6 PG (ref 26.0–34.0)
MCHC: 34.2 g/dL (ref 30.0–36.5)
MCV: 98 FL (ref 80.0–99.0)
MONOCYTES: 10 % (ref 5–13)
NEUTROPHILS: 68 % (ref 32–75)
PLATELET: 127 10*3/uL — ABNORMAL LOW (ref 150–400)
RBC: 4.47 M/uL (ref 4.10–5.70)
RDW: 14.6 % — ABNORMAL HIGH (ref 11.5–14.5)
WBC: 4.3 10*3/uL (ref 4.1–11.1)

## 2016-09-05 LAB — NT-PRO BNP: NT pro-BNP: 129 PG/ML — ABNORMAL HIGH (ref 0–125)

## 2016-09-05 MED ORDER — AZITHROMYCIN 250 MG TAB
250 mg | ORAL_TABLET | ORAL | 0 refills | Status: AC
Start: 2016-09-05 — End: ?

## 2016-09-05 MED ORDER — SODIUM CHLORIDE 0.9 % IJ SYRG
Freq: Three times a day (TID) | INTRAMUSCULAR | Status: DC
Start: 2016-09-05 — End: 2016-09-05

## 2016-09-05 MED ORDER — DEXTROMETHORPHAN-GUAIFENESIN SR 30 MG-600 MG 12 HR TAB
600-30 mg | ORAL_TABLET | Freq: Two times a day (BID) | ORAL | 0 refills | Status: AC | PRN
Start: 2016-09-05 — End: ?

## 2016-09-05 MED ORDER — SODIUM CHLORIDE 0.9 % IJ SYRG
INTRAMUSCULAR | Status: DC | PRN
Start: 2016-09-05 — End: 2016-09-05

## 2016-09-05 NOTE — ED Triage Notes (Signed)
EMS brought pt into ED c/o prod cough with blood-streaked and yellow sputum x 5-7days, PMH of Parkinson's and CHF

## 2016-09-05 NOTE — ED Notes (Signed)
Ok to leave out saline lock at present per beth, pa (pt is a difficult lab draw/piv start and labs were drawn but unable to place saline lock).

## 2016-09-05 NOTE — ED Provider Notes (Signed)
EMERGENCY DEPARTMENT HISTORY AND PHYSICAL EXAM      Date: 09/05/2016  Patient Name: Dale Weaver    History of Presenting Illness     Chief Complaint   Patient presents with   ??? Cough       History Provided By: Patient    HPI: Dale Weaver, 66 y.o. 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 Reports he coughed so hard this am that he coughed up blood a few times. Denies fever/chills, sore throat, ear pain. Hx CHF. Takes lasix if weight over 217. Reports has not had to take it recently. Denies leg swelling. Denies chest pain, dizziness, headache.     PCP: Phys Other, MD    There are no other complaints, changes, or physical findings at this time.    Current Facility-Administered Medications   Medication Dose Route Frequency Provider Last Rate Last Dose   ??? sodium chloride (NS) flush 5-10 mL  5-10 mL IntraVENous Q8H Joaquim LaiElizabeth A Rekisha [place]elling, [place]       ??? sodium chloride (NS) flush 5-10 mL  5-10 mL IntraVENous PRN Joaquim LaiElizabeth A Garreth [place], PA         Current Outpatient Prescriptions   Medication Sig Dispense Refill   ??? aspirin delayed-release 81 mg tablet Take 81 mg by mouth daily.     ??? budesonide-formoterol (SYMBICORT) 160-4.5 mcg/actuation HFAA Take 2 Puffs by inhalation two (2) times a day.     ??? isosorbide mononitrate ER (IMDUR) 60 mg CR tablet Take 60 mg by mouth every morning.     ??? lisinopril (PRINIVIL, ZESTRIL) 10 mg tablet Take 10 mg by mouth daily.     ??? mirtazapine (REMERON) 45 mg tablet Take 45 mg by mouth nightly.     ??? azithromycin (ZITHROMAX Z-PAK) 250 mg tablet Take two tabs by mouth now then one tab daily for the next four days 6 Tab 0   ??? guaiFENesin-dextromethorphan SR (MUCINEX DM) 600-30 mg per tablet Take 1 Tab by mouth every twelve (12) hours as needed for Cough. 10 Tab 0   ??? busPIRone (BUSPAR) 10 mg tablet Take 10 mg by mouth three (3) times daily.      ??? QUEtiapine (SEROQUEL) 100 mg tablet Take 50 mg by mouth two (2) times a day.     ??? albuterol (PROVENTIL HFA, VENTOLIN HFA, PROAIR HFA) 90 mcg/actuation inhaler Take 2 Puffs by inhalation every four (4) hours as needed for Wheezing. 1 Inhaler 0   ??? gabapentin (NEURONTIN) 300 mg capsule Take 600 mg by mouth every eight (8) hours.     ??? carbidopa 25 mg tab Take 50 mg by mouth three (3) times daily.     ??? clonazePAM (KLONOPIN) 1 mg tablet Take 0.5 mg by mouth three (3) times daily.     ??? tamsulosin (FLOMAX) 0.4 mg capsule Take 0.4 mg by mouth nightly.     ??? carbidopa-levodopa (SINEMET) 25-100 mg per tablet Take 1 Tab by mouth three (3) times daily.     ??? FLUoxetine (PROZAC) 20 mg tablet Take 20 mg by mouth daily.     ??? omeprazole (PRILOSEC) 20 mg capsule Take 20 mg by mouth two (2) times a day.     ??? cholecalciferol (VITAMIN D3) 1,000 unit tablet Take 3,000 Units by mouth daily.     ??? carvedilol (COREG) 12.5 mg tablet Take  by mouth two (  2) times daily (with meals).     ??? furosemide (LASIX) 40 mg tablet Take 20 mg by mouth daily.     ??? nitroglycerin (NITROSTAT) 0.4 mg SL tablet by SubLINGual route every five (5) minutes as needed for Chest Pain.     ??? tiZANidine (ZANAFLEX) 4 mg capsule Take 4 mg by mouth three (3) times daily.     ??? miconazole (MICOTIN) 2 % topical powder Apply  to affected area daily. Apply to affected area     ??? cyanocobalamin (VITAMIN B12) 1,000 mcg/mL injection 1,000 mcg by IntraMUSCular route every thirty (30) days.     ??? atorvastatin (LIPITOR) 80 mg tablet Take 40 mg by mouth daily.         Past History     Past Medical History:  Past Medical History:   Diagnosis Date   ??? Back pain     chronic   ??? CAD (coronary artery disease)     MI 2008   ??? Heart failure (HCC)     CHF   ??? Hiatal hernia    ??? Parkinson's disease ([place])    ??? Psychiatric disorder     anxiety   ??? TIA (transient ischemic attack) 01/2014       Past Surgical History:  Past Surgical History:   Procedure Laterality Date    ??? CARDIAC SURG PROCEDURE UNLIST      cardiac cath August 2009   ??? CARDIAC SURG PROCEDURE UNLIST  2010    x 1 stent, cardiologist @ [place]   ??? [place]      insertion of loop recorder removed 06/2014   ??? HX APPENDECTOMY     ??? HX CHOLECYSTECTOMY     ??? HX HERNIA REPAIR     ??? HX ORTHOPAEDIC      back surgery x4, last June 2010       Family History:  Family History   Problem Relation Age of Onset   ??? Stroke Father    ??? Hypertension Mother        Social History:  Social History   Substance Use Topics   ??? Smoking status: Never Smoker   ??? Smokeless tobacco: Never Used   ??? Alcohol use No       Allergies:  Allergies   Allergen Reactions   ??? Seafood [Shellfish Containing Products] Hives     Allergy to fish only.  Can eat shrimp.  No allergy to IV contrast.   ??? Sulfa (Sulfonamide Antibiotics) Hives   ??? Zoloft [Sertraline] Itching         Review of Systems   Review of Systems   Constitutional: Negative for chills and fever.   HENT: Negative for congestion, ear pain, facial swelling, sneezing, sore throat and tinnitus.    Eyes: Negative for photophobia and visual disturbance.   Respiratory: Positive for cough. Negative for chest tightness, shortness of breath, wheezing and stridor.    Cardiovascular: Negative for chest pain.   Gastrointestinal: Negative for abdominal pain, nausea and vomiting.   Genitourinary: Negative for flank pain.   Musculoskeletal: Negative for back pain and myalgias.   Skin: Negative for color change, pallor, rash and wound.   Neurological: Negative for dizziness, weakness, light-headedness and headaches.   All other systems reviewed and are negative.      Physical Exam   Physical Exam   Constitutional: He is oriented to person, place, and time. He appears well-developed and well-nourished. No distress.   HENT:  Head: Normocephalic and atraumatic.   Eyes: Conjunctivae are normal.   Cardiovascular: Normal rate, regular rhythm and normal heart sounds.     Pulmonary/Chest: Effort normal. No respiratory distress. He has decreased breath sounds in the right lower field. He has no wheezes. He has no rhonchi. He has no rales.   Abdominal: Soft. Bowel sounds are normal. He exhibits no distension. There is no tenderness. There is no rebound.   Musculoskeletal: Normal range of motion.   Neurological: He is alert and oriented to person, place, and time. No cranial nerve deficit.   Skin: Skin is warm. No rash noted. No erythema.   Psychiatric: He has a normal mood and affect. His behavior is normal.   Nursing note and vitals reviewed.        Diagnostic Study Results     Labs -     Recent Results (from the past 12 hour(s))   NT-PRO BNP    Collection Time: 09/05/16  3:14 PM   Result Value Ref Range    NT pro-BNP 129 (H) 0 - 125 PG/ML   CBC WITH AUTOMATED DIFF    Collection Time: 09/05/16  3:14 PM   Result Value Ref Range    WBC 4.3 4.1 - 11.1 K/uL    RBC 4.47 4.10 - 5.70 M/uL    HGB 15.0 12.1 - 17.0 g/dL    HCT 43.8 36.6 - 50.3 %    MCV 98.0 80.0 - 99.0 FL    MCH 33.6 26.0 - 34.0 PG    MCHC 34.2 30.0 - 36.5 g/dL    RDW 14.6 (H) 11.5 - 14.5 %    PLATELET 127 (L) 150 - 400 K/uL    NEUTROPHILS 68 32 - 75 %    LYMPHOCYTES 20 12 - 49 %    MONOCYTES 10 5 - 13 %    EOSINOPHILS 2 0 - 7 %    BASOPHILS 0 0 - 1 %    ABS. NEUTROPHILS 2.9 1.8 - 8.0 K/UL    ABS. LYMPHOCYTES 0.8 0.8 - 3.5 K/UL    ABS. MONOCYTES 0.4 0.0 - 1.0 K/UL    ABS. EOSINOPHILS 0.1 0.0 - 0.4 K/UL    ABS. BASOPHILS 0.0 0.0 - 0.1 K/UL   METABOLIC PANEL, BASIC    Collection Time: 09/05/16  3:14 PM   Result Value Ref Range    Sodium 145 136 - 145 mmol/L    Potassium 3.9 3.5 - 5.1 mmol/L    Chloride 109 (H) 97 - 108 mmol/L    CO2 27 21 - 32 mmol/L    Anion gap 9 5 - 15 mmol/L    Glucose 104 (H) 65 - 100 mg/dL    BUN 16 6 - 20 MG/DL    Creatinine 1.08 0.70 - 1.30 MG/DL    BUN/Creatinine ratio 15 12 - 20      GFR est AA >60 >60 ml/min/1.83m2    GFR est non-AA >60 >60 ml/min/1.18m2    Calcium 8.4 (L) 8.5 - 10.1 MG/DL        Radiologic Studies -   XR CHEST PA LAT   Final Result        CT Results  (Last 48 hours)    None        CXR Results  (Last 48 hours)               09/05/16 1522  XR CHEST PA LAT Final result    Impression:  IMPRESSION:  1. No pneumonia   2. 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 There is some   atelectasis above the right diaphragm. This is unchanged       Narrative:  Exam:  2 view chest       Indication: Cough and decreased breath sounds right lung field.       COMPARISON: 02/29/2016       PA and lateral views demonstrate normal heart size.       The right hemidiaphragm remains elevated and there are areas of atelectasis   above the elevated diaphragm which are unchanged.       Left lung is clear. There is no pneumonia. No adenopathy or pleural effusions.                   Medical Decision Making   I am the first provider for this patient.    I reviewed the vital signs, available nursing notes, past medical history, past surgical history, family history and social history.    Vital Signs-Reviewed the patient's vital signs.  Patient Vitals for the past 12 hrs:   Temp Pulse Resp BP SpO2   09/05/16 1515 - - - - 92 %   09/05/16 1500 - - - 130/82 93 %   09/05/16 1453 97.4 ??F (36.3 ??C) 78 16 137/80 95 %     ED EKG interpretation:  Rhythm: normal sinus rhythm; and regular . Rate (approx.): 66; Axis: normal; P wave: normal; QRS interval: normal ; ST/T wave: normal; Other findings: normal. This EKG was interpreted by Paulo Fruit, PA,ED Provider.        Records Reviewed: Nursing Notes, Old Medical Records, Previous Radiology Studies and Previous Laboratory Studies      Provider Notes (Medical Decision Making):   DDx: Bronchitis, Pneumonia, CHF Exacerbation, URI      ED Course:   Initial assessment performed. The patients presenting problems have been discussed, and they are in agreement with the care plan formulated and outlined with them.  I have encouraged them to ask questions as they arise  throughout their visit.      Disposition:  3:55 PM  Discussed lab and imaging results with pt along with dx and treatment plan. Discussed importance of   PCP follow up. All questions answered. Pt voiced they understood. Return if sx worsen.       PLAN:  1.   Current Discharge Medication List      START taking these medications    Details   azithromycin (ZITHROMAX Z-PAK) 250 mg tablet Take two tabs by mouth now then one tab daily for the next four days  Qty: 6 Tab, Refills: 0      guaiFENesin-dextromethorphan SR (MUCINEX DM) 600-30 mg per tablet Take 1 Tab by mouth every twelve (12) hours as needed for Cough.  Qty: 10 Tab, Refills: 0           2.   Follow-up Information     Follow up With Details Comments Contact Info    [facility] Schedule an appointment as soon as possible for a visit in 2 days As needed [place]  [phone]        Return to ED if worse     Diagnosis     Clinical Impression:   1. Acute bronchitis, unspecified organism

## 2016-09-05 NOTE — ED Notes (Signed)
..    Emergency Department Nursing Plan of Care       The Nursing Plan of Care is developed from the Nursing assessment and Emergency Department Attending provider initial evaluation.  The plan of care may be reviewed in the ???ED Provider note???.    The Plan of Care was developed with the following considerations:   Patient / Family readiness to learn indicated [id number] understanding and appropriate questions asked  Persons(s) to be included in education: patient  Barriers to Learning/Limitations:Yes:, Cognitive/physical impairment:Parkinson's, poor short-term memory    Signed     Charlett Nose, RN    09/05/2016   3:20 PM

## 2016-09-05 NOTE — ED Notes (Signed)
Pt discharged by provider with no si/s of acute distress. No insurance cab transportation available. [place] local transportation arranged for pt-pt agreed to wait in the waiting room.

## 2016-09-07 LAB — EKG 12-LEAD
Atrial Rate: 66 {beats}/min
Diagnosis: NORMAL
P Axis: 84 degrees
P-R Interval: 146 ms
Q-T Interval: 432 ms
QRS Duration: 90 ms
QTc Calculation (Bazett): 452 ms
R Axis: 86 degrees
T Axis: 79 degrees
Ventricular Rate: 66 {beats}/min

## 2016-09-07 LAB — EKG, 12 LEAD, INITIAL
Atrial Rate: 66 {beats}/min
Calculated P Axis: 84 degrees
Calculated R Axis: 86 degrees
Calculated T Axis: 79 degrees
Diagnosis: NORMAL
P-R Interval: 146 ms
Q-T Interval: 432 ms
QRS Duration: 90 ms
QTC Calculation (Bezet): 452 ms
Ventricular Rate: 66 {beats}/min

## 2016-12-01 ENCOUNTER — Inpatient Hospital Stay
Admit: 2016-12-01 | Discharge: 2016-12-01 | Disposition: A | Discharge status: Home / Self Care | Payer: MEDICARE | Attending: Emergency Medicine

## 2016-12-01 DIAGNOSIS — L259 Unspecified contact dermatitis, unspecified cause: Secondary | ICD-10-CM

## 2016-12-01 MED ORDER — HYDROCORTISONE 1 % TOPICAL CREAM
1 % | Freq: Two times a day (BID) | CUTANEOUS | 0 refills | Status: AC
Start: 2016-12-01 — End: ?

## 2016-12-01 NOTE — ED Provider Notes (Addendum)
EMERGENCY DEPARTMENT HISTORY AND PHYSICAL EXAM      Date: 12/01/2016  Patient Name: Dale Weaver    History of Presenting Illness     Chief Complaint   Patient presents with   ??? Skin Problem       History Provided By: Patient    HPI: Dale Weaver, 66 y.o. male with PMHx significant for CHF, chronic back pain, CAD, anxiety, hiatal hernia, TIA, parkinsons, presents ambulatory to the ED with cc of rash to right lower leg x 1 day. Pt reports mild pain with touching. Denies itching. States he was outside at the park yesterday but was wearing pants. Denies trauma/injury, drainage, fever/chills. Has not tried anything for sx. Denies pain currently.       PCP: Phys Other, MD    There are no other complaints, changes, or physical findings at this time.    Current Outpatient Prescriptions   Medication Sig Dispense Refill   ??? hydrocortisone (CORTAID) 1 % topical cream Apply  to affected area two (2) times a day. use thin layer 30 g 0   ??? aspirin delayed-release 81 mg tablet Take 81 mg by mouth daily.     ??? budesonide-formoterol (SYMBICORT) 160-4.5 mcg/actuation HFAA Take 2 Puffs by inhalation two (2) times a day.     ??? carvedilol (COREG) 12.5 mg tablet Take  by mouth two (2) times daily (with meals).     ??? furosemide (LASIX) 40 mg tablet Take 20 mg by mouth daily.     ??? isosorbide mononitrate ER (IMDUR) 60 mg CR tablet Take 60 mg by mouth every morning.     ??? lisinopril (PRINIVIL, ZESTRIL) 10 mg tablet Take 10 mg by mouth daily.     ??? mirtazapine (REMERON) 45 mg tablet Take 45 mg by mouth nightly.     ??? nitroglycerin (NITROSTAT) 0.4 mg SL tablet by SubLINGual route every five (5) minutes as needed for Chest Pain.     ??? azithromycin (ZITHROMAX Z-PAK) 250 mg tablet Take two tabs by mouth now then one tab daily for the next four days 6 Tab 0   ??? guaiFENesin-dextromethorphan SR (MUCINEX DM) 600-30 mg per tablet Take 1 Tab by mouth every twelve (12) hours as needed for Cough. 10 Tab 0    ??? busPIRone (BUSPAR) 10 mg tablet Take 10 mg by mouth three (3) times daily.     ??? QUEtiapine (SEROQUEL) 100 mg tablet Take 50 mg by mouth two (2) times a day.     ??? tiZANidine (ZANAFLEX) 4 mg capsule Take 4 mg by mouth three (3) times daily.     ??? albuterol (PROVENTIL HFA, VENTOLIN HFA, PROAIR HFA) 90 mcg/actuation inhaler Take 2 Puffs by inhalation every four (4) hours as needed for Wheezing. 1 Inhaler 0   ??? gabapentin (NEURONTIN) 300 mg capsule Take 600 mg by mouth every eight (8) hours.     ??? carbidopa 25 mg tab Take 50 mg by mouth three (3) times daily.     ??? clonazePAM (KLONOPIN) 1 mg tablet Take 0.5 mg by mouth three (3) times daily.     ??? tamsulosin (FLOMAX) 0.4 mg capsule Take 0.4 mg by mouth nightly.     ??? miconazole (MICOTIN) 2 % topical powder Apply  to affected area daily. Apply to affected area     ??? carbidopa-levodopa (SINEMET) 25-100 mg per tablet Take 1 Tab by mouth three (3) times daily.     ??? FLUoxetine (PROZAC) 20 mg tablet Take 20  mg by mouth daily.     ??? omeprazole (PRILOSEC) 20 mg capsule Take 20 mg by mouth two (2) times a day.     ??? cyanocobalamin (VITAMIN B12) 1,000 mcg/mL injection 1,000 mcg by IntraMUSCular route every thirty (30) days.     ??? cholecalciferol (VITAMIN D3) 1,000 unit tablet Take 3,000 Units by mouth daily.     ??? atorvastatin (LIPITOR) 80 mg tablet Take 40 mg by mouth daily.         Past History     Past Medical History:  Past Medical History:   Diagnosis Date   ??? Back pain     chronic   ??? CAD (coronary artery disease)     MI 2008   ??? Heart failure (HCC)     CHF   ??? Hiatal hernia    ??? Parkinson's disease ([place])    ??? Psychiatric disorder     anxiety   ??? TIA (transient ischemic attack) 01/2014       Past Surgical History:  Past Surgical History:   Procedure Laterality Date   ??? CARDIAC SURG PROCEDURE UNLIST      cardiac cath August 2009   ??? CARDIAC SURG PROCEDURE UNLIST  2010    x 1 stent, cardiologist @ [place]   ??? [place]       insertion of loop recorder removed 06/2014   ??? HX APPENDECTOMY     ??? HX CHOLECYSTECTOMY     ??? HX HERNIA REPAIR     ??? HX ORTHOPAEDIC      back surgery x4, last June 2010       Family History:  Family History   Problem Relation Age of Onset   ??? Stroke Father    ??? Hypertension Mother        Social History:  Social History   Substance Use Topics   ??? Smoking status: Never Smoker   ??? Smokeless tobacco: Never Used   ??? Alcohol use No       Allergies:  Allergies   Allergen Reactions   ??? Seafood [Shellfish Containing Products] Hives     Allergy to fish only.  Can eat shrimp.  No allergy to IV contrast.   ??? Sulfa (Sulfonamide Antibiotics) Hives   ??? Zoloft [Sertraline] Itching         Review of Systems   Review of Systems   Constitutional: Negative for chills and fever.   HENT: Negative for facial swelling.    Eyes: Negative for photophobia and visual disturbance.   Respiratory: Negative for shortness of breath.    Cardiovascular: Negative for chest pain.   Gastrointestinal: Negative for abdominal pain, nausea and vomiting.   Genitourinary: Negative for flank pain.   Skin: Positive for rash. Negative for color change, pallor and wound.   Neurological: Negative for dizziness, weakness, light-headedness and headaches.   All other systems reviewed and are negative.      Physical Exam   Physical Exam   Constitutional: He is oriented to person, place, and time. He appears well-developed and well-nourished. No distress.   HENT:   Head: Normocephalic and atraumatic.   Eyes: Conjunctivae are normal.   Cardiovascular: Normal rate, regular rhythm and normal heart sounds.    Pulmonary/Chest: Effort normal and breath sounds normal. No respiratory distress.   Abdominal: Soft. Bowel sounds are normal. There is no tenderness.   Musculoskeletal: Normal range of motion.   Neurological: He is alert and oriented to person, place, and time.  No cranial nerve deficit.   Skin: Skin is warm. Rash noted.    Patch of erythematous skin to right lower leg. Non-raised. Mild TTP. No induration, fluctuance, drainage, vesicles.    Psychiatric: He has a normal mood and affect. His behavior is normal.   Nursing note and vitals reviewed.        Diagnostic Study Results     Labs -   No results found for this or any previous visit (from the past 12 hour(s)).    Radiologic Studies -   No orders to display     CT Results  (Last 48 hours)    None        CXR Results  (Last 48 hours)    None            Medical Decision Making   I am the first provider for this patient.    I reviewed the vital signs, available nursing notes, past medical history, past surgical history, family history and social history.    Vital Signs-Reviewed the patient's vital signs.  Patient Vitals for the past 12 hrs:   Temp Pulse Resp BP SpO2   12/01/16 1128 97.9 ??F (36.6 ??C) 81 16 127/79 95 %       Records Reviewed: Nursing Notes and Old Medical Records    Provider Notes (Medical Decision Making):   DDx: Contact dermatitis, poison ivy, shingles    ED Course:   Initial assessment performed. The patients presenting problems have been discussed, and they are in agreement with the care plan formulated and outlined with them.  I have encouraged them to ask questions as they arise throughout their visit.      Disposition:  11:54 AM  Discussed dx and treatment plan. Discussed importance of  PCP follow up. All questions answered. Pt voiced they understood. Return if sx worsen.       PLAN:  1.   Current Discharge Medication List      START taking these medications    Details   hydrocortisone (CORTAID) 1 % topical cream Apply  to affected area two (2) times a day. use thin layer  Qty: 30 g, Refills: 0           2.   Follow-up Information     Follow up With Details Comments Contact Info      make an appointment with PCP in 1 day for follow up         Return to ED if worse     Diagnosis     Clinical Impression:    1. Contact dermatitis, unspecified contact dermatitis type, unspecified trigger

## 2016-12-01 NOTE — ED Notes (Signed)
Patient discharged by provider prior to nursing assessment.

## 2016-12-01 NOTE — ED Triage Notes (Signed)
C/o red, painful when touched, rash to right shin area that is spreading since yesterday; denied new medication/soap/throat irritation, was outside at park yesterday

## 2017-12-13 ENCOUNTER — Encounter: Payer: Self-pay | Admitting: Internal Medicine

## 2017-12-13 ENCOUNTER — Non-Acute Institutional Stay (SKILLED_NURSING_FACILITY): Payer: Medicare Other | Admitting: Internal Medicine

## 2017-12-13 DIAGNOSIS — I10 Essential (primary) hypertension: Secondary | ICD-10-CM | POA: Diagnosis not present

## 2017-12-13 DIAGNOSIS — F419 Anxiety disorder, unspecified: Secondary | ICD-10-CM | POA: Diagnosis not present

## 2017-12-13 DIAGNOSIS — N3090 Cystitis, unspecified without hematuria: Secondary | ICD-10-CM | POA: Diagnosis not present

## 2017-12-13 DIAGNOSIS — K219 Gastro-esophageal reflux disease without esophagitis: Secondary | ICD-10-CM

## 2017-12-13 DIAGNOSIS — G259 Extrapyramidal and movement disorder, unspecified: Secondary | ICD-10-CM

## 2017-12-13 DIAGNOSIS — F32A Depression, unspecified: Secondary | ICD-10-CM

## 2017-12-13 DIAGNOSIS — I5032 Chronic diastolic (congestive) heart failure: Secondary | ICD-10-CM | POA: Diagnosis not present

## 2017-12-13 DIAGNOSIS — B9689 Other specified bacterial agents as the cause of diseases classified elsewhere: Secondary | ICD-10-CM | POA: Diagnosis not present

## 2017-12-13 DIAGNOSIS — M5136 Other intervertebral disc degeneration, lumbar region: Secondary | ICD-10-CM | POA: Diagnosis not present

## 2017-12-13 DIAGNOSIS — F329 Major depressive disorder, single episode, unspecified: Secondary | ICD-10-CM

## 2017-12-13 DIAGNOSIS — B961 Klebsiella pneumoniae [K. pneumoniae] as the cause of diseases classified elsewhere: Secondary | ICD-10-CM

## 2017-12-13 NOTE — Progress Notes (Signed)
: Provider:  Margit Hanks., MD Location:  [facility] Room Number: [id number] Place of Service:  SNF ([id number])  PCP: No primary care provider on file. No care team member to display  Extended Emergency Contact Information Primary Emergency Contact: Cecille Po Home Phone: [phone] Relation: Other     Allergies: Fish-derived products and Sulfamethizole  Chief Complaint  Patient presents with  . New Admit To SNF    New admission to [facility] SNF    HPI: Patient is 67 y.o. male with depression, hypertension, GERD, and history of chronic low back pain status post T11-T12 fracture with L2-L5 laminectomy who presented to [facility] due to a complicated mechanical fall resulting in acute worsening of his chronic back pain along with questionable urinary incontinence, questionable bowel incontinence, and lower extremity weakness due to the severity of low back pain.  Patient was admitted to [facility] from 4/10-25 where he underwent numerous imaging studies and a thorough neurologic exam with neurology back pain and subjective neurologic symptoms could not be explained by imaging or by work-up.  Of note the patient has been diagnosed with conversion disorder in the past.  Hospital course was complicated by a Klebsiella pneumonia UTI which was treated with ciprofloxacin and Rocephin and is considered treated.  Patient is admitted to skilled nursing facility for OT/PT.  While at skilled nursing facility patient will be followed for Parkinson's disease treated with Sinemet, GERD treated with Protonix and depression treated with Remeron Seroquel.  Past Medical History:  Diagnosis Date  . Acute cystitis with hematuria   . Acute midline low back pain with sciatica   . Chronic heart failure with preserved ejection fraction (HCC)   . DDD (degenerative disc disease), lumbar   . Depressive disorder   . Drug induced  constipation   . GERD (gastroesophageal reflux disease)   . History of TIA (transient ischemic attack)   . Hypertension   . Movement disorder   . Uncomplicated alcohol dependence (HCC)   . Urinary incontinence     Past Surgical History:  Procedure Laterality Date  . BACK SURGERY      Allergies as of 12/13/2017      Reactions   Fish-derived Products Anaphylaxis   Sulfamethizole Rash      Medication List        Accurate as of 12/13/17 12:44 PM. Always use your most recent med list.          acetaminophen 500 MG tablet Commonly known as:  TYLENOL Take 500 mg by mouth every 8 (eight) hours.   aspirin 81 MG chewable tablet Chew 81 mg by mouth daily.   bisacodyl 5 MG EC tablet Commonly known as:  DULCOLAX Take 5 mg by mouth daily as needed (Drug-induced constipation).   busPIRone 10 MG tablet Commonly known as:  BUSPAR Take 10 mg by mouth 3 (three) times daily.   carbidopa-levodopa 25-100 MG tablet Commonly known as:  SINEMET IR Take 1 tablet by mouth 3 (three) times daily.   carvedilol 3.125 MG tablet Commonly known as:  COREG Take 3.125 mg by mouth 2 (two) times daily with a meal.   fentaNYL 25 MCG/HR patch Commonly known as:  DURAGESIC - dosed mcg/hr Place 25 mcg onto the skin every 3 (three) days. Start taking on:  12/15/2017   FLUoxetine 40 MG capsule Commonly known as:  PROZAC Take 40 mg by mouth daily.   furosemide 20 MG  tablet Commonly known as:  LASIX Take 20 mg by mouth daily.   gabapentin 300 MG capsule Commonly known as:  NEURONTIN Take 300 mg by mouth 3 (three) times daily.   methocarbamol 500 MG tablet Commonly known as:  ROBAXIN Take 500 mg by mouth 4 (four) times daily.   mirtazapine 45 MG tablet Commonly known as:  REMERON Take 45 mg by mouth at bedtime.   oxyCODONE 5 MG immediate release tablet Commonly known as:  Oxy IR/ROXICODONE Take 5 mg by mouth every 6 (six) hours as needed for severe pain.   pantoprazole 40 MG  tablet Commonly known as:  PROTONIX Take 40 mg by mouth daily.   polyethylene glycol packet Commonly known as:  MIRALAX / GLYCOLAX Take 17 g by mouth daily.   QUEtiapine 100 MG tablet Commonly known as:  SEROQUEL Take 150 mg by mouth at bedtime. Take 1-1/2 tablets to - 150 mg for depressive disorder   QUEtiapine 50 MG tablet Commonly known as:  SEROQUEL Take 50 mg by mouth at bedtime.   traMADol 50 MG tablet Commonly known as:  ULTRAM Take 50 mg by mouth every 6 (six) hours as needed.   Vitamin D3 1000 units Caps Take 1 capsule by mouth 3 (three) times daily.       No orders of the defined types were placed in this encounter.   Immunization History  Administered Date(s) Administered  . Influenza-Unspecified 05/20/2017  . Pneumococcal-Unspecified 05/20/2017    Social History   Tobacco Use  . Smoking status: Never Smoker  . Smokeless tobacco: Never Used  Substance Use Topics  . Alcohol use: Not Currently    Comment: Vodka    Family history is   History reviewed. No pertinent family history.    Review of Systems  DATA OBTAINED: from patient, nurse GENERAL:  no fevers, fatigue, appetite changes SKIN: No itching, or rash EYES: No eye pain, redness, discharge EARS: No earache, tinnitus, change in hearing NOSE: No congestion, drainage or bleeding  MOUTH/THROAT: No mouth or tooth pain, No sore throat RESPIRATORY: No cough, wheezing, SOB CARDIAC: No chest pain, palpitations, lower extremity edema  GI: No abdominal pain, No N/V/D or constipation, No heartburn or reflux  GU: No dysuria, frequency or urgency, or incontinence  MUSCULOSKELETAL: No unrelieved bone/joint pain NEUROLOGIC: No headache, dizziness or focal weakness PSYCHIATRIC: No c/o anxiety or sadness   Vitals:   12/13/17 0915  BP: 113/62  Pulse: 87  Resp: 20  Temp: 99.3 F (37.4 C)    SpO2 Readings from Last 1 Encounters:  No data found for SpO2   Body mass index is 32.08  kg/m.     Physical Exam  GENERAL APPEARANCE: Alert, conversant,  No acute distress.  SKIN: No diaphoresis rash HEAD: Normocephalic, atraumatic  EYES: Conjunctiva/lids clear. Pupils round, reactive. EOMs intact.  EARS: External exam WNL, canals clear. Hearing grossly normal.  NOSE: No deformity or discharge.  MOUTH/THROAT: Lips w/o lesions  RESPIRATORY: Breathing is even, unlabored. Lung sounds are clear   CARDIOVASCULAR: Heart RRR no murmurs, rubs or gallops. No peripheral edema.   GASTROINTESTINAL: Abdomen is soft, non-tender, not distended w/ normal bowel sounds. GENITOURINARY: Bladder non tender, not distended  MUSCULOSKELETAL: No abnormal joints or musculature NEUROLOGIC:  Cranial nerves 2-12 grossly intact. Moves all extremities  PSYCHIATRIC: Mood and affect appropriate to situation, no behavioral issues  There are no active problems to display for this patient.     Labs reviewed: Basic Metabolic Panel: No results found  for: NA, K, CL, CO2, GLUCOSE, BUN, CREATININE, CALCIUM, PROT, ALBUMIN, AST, ALT, ALKPHOS, BILITOT, GFRNONAA, GFRAA  No results for input(s): NA, K, CL, CO2, GLUCOSE, BUN, CREATININE, CALCIUM, MG, PHOS in the last 8760 hours. Liver Function Tests: No results for input(s): AST, ALT, ALKPHOS, BILITOT, PROT, ALBUMIN in the last 8760 hours. No results for input(s): LIPASE, AMYLASE in the last 8760 hours. No results for input(s): AMMONIA in the last 8760 hours. CBC: No results for input(s): WBC, NEUTROABS, HGB, HCT, MCV, PLT in the last 8760 hours. Lipid No results for input(s): CHOL, HDL, LDLCALC, TRIG in the last 8760 hours.  Cardiac Enzymes: No results for input(s): CKTOTAL, CKMB, CKMBINDEX, TROPONINI in the last 8760 hours. BNP: No results for input(s): BNP in the last 8760 hours. No results found for: MICROALBUR No results found for: HGBA1C No results found for: TSH No results found for: VITAMINB12 No results found for: FOLATE No results found  for: IRON, TIBC, FERRITIN  Imaging and Procedures obtained prior to SNF admission: Patient was never admitted.   Not all labs, radiology exams or other studies done during hospitalization come through on my EPIC note; however they are reviewed by me.    Assessment and Plan  Degenerative disc disease, lumbar- there was concern for cauda equinus syndrome which was not found but which involved an extensive work-up during [facility]; lower extremity weakness along with bowel and bladder incontinence improved over the course neurochecks incontinent catheter discontinued on 4/17 PT recommended home with outpatient PT but patient is increased care needs and inability to perform ADLs required that he be discharged to skilled nursing facility; pain management was consulted to assist in medical therapy for patient's acute on chronic pain.  They came up the following plan: Schedule Tylenol 1 g 3 times daily, scheduled gabapentin 300 mg q. 8, Robaxin 500 mg every 6 scheduled tramadol 50 mg every 6 for mild pain oxycodone 5 to 10 mg every 6 for moderate to severe pain and is scheduled fentanyl patch.  Patient is to be slowly titrated up on his gabapentin a bowel regimen was started SNF -patient is admitted for OT/PT; complicated pain management includes 1 g of Tylenol 3 times daily, scheduled Neurontin 300 cubic milligrams every 8, scheduled Robaxin 500 mg every 6; patient was sent now with tramadol 50 mg every 6 as needed as well as oxycodone 5 mg every 6 as needed, both prescriptions of which cannot be used with the patient for longer than 2 weeks so not much help; continue fentanyl patch 25 mcg/h to 72 hours  Acute cystitis- urine grew out pansensitive Klebsiella pneumoniae which was treated with Rocephin then ciprofloxacin for a completed treatment  Movement disorder/reported diagnosis of Parkinson's SNF -continue Sinemet 25-100 p.o. 3 times daily  Chronic  heart failure with preserved ejection fraction- blood pressures on the soft side so Lasix 40 was held and then reinitiated at discharge at 20 mg daily SNF -continue Lasix 20 mg p.o. Daily  GERD SNF -not stated as uncontrolled; continue Protonix 40 mg daily  Hypertension SNF -was stated the patient was to discontinue his Coreg, however Coreg is listed on his discharge list so will continue with Coreg 3.125 mg twice daily and Lasix 20 mg daily and monitor blood pressure and change this as needed  Depression SNF -patient on complicated regimen including Remeron 45 mg nightly, Prozac 40 mg daily, Seroquel 100 mg nightly  Anxiety SNF -continue BuSpar 10 mg  3 times daily   Time spent greater than 45 minutes;> 50% of time with patient was spent reviewing records, labs, tests and studies, counseling and developing plan of care  Margit Hanks, MD

## 2017-12-15 ENCOUNTER — Encounter: Payer: Self-pay | Admitting: Internal Medicine

## 2017-12-15 DIAGNOSIS — I5032 Chronic diastolic (congestive) heart failure: Secondary | ICD-10-CM | POA: Insufficient documentation

## 2017-12-15 DIAGNOSIS — M5136 Other intervertebral disc degeneration, lumbar region: Secondary | ICD-10-CM | POA: Insufficient documentation

## 2017-12-15 DIAGNOSIS — B9689 Other specified bacterial agents as the cause of diseases classified elsewhere: Secondary | ICD-10-CM | POA: Insufficient documentation

## 2017-12-15 DIAGNOSIS — B961 Klebsiella pneumoniae [K. pneumoniae] as the cause of diseases classified elsewhere: Secondary | ICD-10-CM | POA: Insufficient documentation

## 2017-12-15 DIAGNOSIS — K219 Gastro-esophageal reflux disease without esophagitis: Secondary | ICD-10-CM | POA: Insufficient documentation

## 2017-12-15 DIAGNOSIS — F32A Depression, unspecified: Secondary | ICD-10-CM | POA: Insufficient documentation

## 2017-12-15 DIAGNOSIS — N3090 Cystitis, unspecified without hematuria: Secondary | ICD-10-CM | POA: Insufficient documentation

## 2017-12-15 DIAGNOSIS — F329 Major depressive disorder, single episode, unspecified: Secondary | ICD-10-CM | POA: Insufficient documentation

## 2017-12-15 DIAGNOSIS — G259 Extrapyramidal and movement disorder, unspecified: Secondary | ICD-10-CM | POA: Insufficient documentation

## 2017-12-15 DIAGNOSIS — F419 Anxiety disorder, unspecified: Secondary | ICD-10-CM | POA: Insufficient documentation

## 2017-12-15 DIAGNOSIS — I10 Essential (primary) hypertension: Secondary | ICD-10-CM | POA: Insufficient documentation

## 2017-12-17 LAB — BASIC METABOLIC PANEL
BUN: 11 (ref 4–21)
Creatinine: 0.6 (ref 0.6–1.3)
Glucose: 101
Potassium: 3.6 (ref 3.4–5.3)
SODIUM: 144 (ref 137–147)

## 2017-12-17 LAB — CBC AND DIFFERENTIAL
HCT: 35 — AB (ref 41–53)
Hemoglobin: 11.9 — AB (ref 13.5–17.5)
PLATELETS: 171 (ref 150–399)
WBC: 5.3

## 2017-12-25 ENCOUNTER — Non-Acute Institutional Stay (SKILLED_NURSING_FACILITY): Payer: Medicare Other | Admitting: Internal Medicine

## 2017-12-25 DIAGNOSIS — R0602 Shortness of breath: Secondary | ICD-10-CM

## 2017-12-30 ENCOUNTER — Non-Acute Institutional Stay (SKILLED_NURSING_FACILITY): Payer: Medicare Other | Admitting: Internal Medicine

## 2017-12-30 ENCOUNTER — Encounter: Payer: Self-pay | Admitting: Internal Medicine

## 2017-12-30 DIAGNOSIS — N3090 Cystitis, unspecified without hematuria: Secondary | ICD-10-CM

## 2017-12-30 DIAGNOSIS — I5032 Chronic diastolic (congestive) heart failure: Secondary | ICD-10-CM

## 2017-12-30 DIAGNOSIS — I10 Essential (primary) hypertension: Secondary | ICD-10-CM

## 2017-12-30 DIAGNOSIS — G259 Extrapyramidal and movement disorder, unspecified: Secondary | ICD-10-CM

## 2017-12-30 DIAGNOSIS — K219 Gastro-esophageal reflux disease without esophagitis: Secondary | ICD-10-CM | POA: Diagnosis not present

## 2017-12-30 DIAGNOSIS — F419 Anxiety disorder, unspecified: Secondary | ICD-10-CM

## 2017-12-30 DIAGNOSIS — B9689 Other specified bacterial agents as the cause of diseases classified elsewhere: Secondary | ICD-10-CM

## 2017-12-30 DIAGNOSIS — M5136 Other intervertebral disc degeneration, lumbar region: Secondary | ICD-10-CM | POA: Diagnosis not present

## 2017-12-30 DIAGNOSIS — F329 Major depressive disorder, single episode, unspecified: Secondary | ICD-10-CM | POA: Diagnosis not present

## 2017-12-30 DIAGNOSIS — F32A Depression, unspecified: Secondary | ICD-10-CM

## 2017-12-30 DIAGNOSIS — B961 Klebsiella pneumoniae [K. pneumoniae] as the cause of diseases classified elsewhere: Secondary | ICD-10-CM

## 2017-12-30 NOTE — Progress Notes (Signed)
Location:  [facility] Room Number: [phone] Place of Service:  SNF [phone]) Raymond White. Lyn Hollingshead, MD  PCP: No primary care provider on file. No care team member to display  Extended Emergency Contact Information Primary Emergency Contact: Cecille Po Home Phone: [phone] Relation: Other  Allergies  Allergen Reactions  . Fish-Derived Products Anaphylaxis  . Sulfamethizole Rash    Chief Complaint  Patient presents with  . Discharge Note    Discharged from SNF    HPI:  67 y.o. male      Past Medical History:  Diagnosis Date  . Acute cystitis with hematuria   . Acute midline low back pain with sciatica   . Chronic heart failure with preserved ejection fraction (HCC)   . DDD (degenerative disc disease), lumbar   . Depressive disorder   . Drug induced constipation   . GERD (gastroesophageal reflux disease)   . History of TIA (transient ischemic attack)   . Hypertension   . Movement disorder   . Uncomplicated alcohol dependence (HCC)   . Urinary incontinence     Past Surgical History:  Procedure Laterality Date  . BACK SURGERY       reports that he has never smoked. He has never used smokeless tobacco. He reports that he drank alcohol. His drug history is not on file. Social History   Socioeconomic History  . Marital status: Widowed    Spouse name: Not on file  . Number of children: Not on file  . Years of education: Not on file  . Highest education level: Not on file  Occupational History  . Not on file  Social Needs  . Financial resource strain: Not on file  . Food insecurity:    Worry: Not on file    Inability: Not on file  . Transportation needs:    Medical: Not on file    Non-medical: Not on file  Tobacco Use  . Smoking status: Never Smoker  . Smokeless tobacco: Never Used  Substance and Sexual Activity  . Alcohol use: Not Currently    Comment: Vodka  . Drug use: Not on file  . Sexual activity: Not on file    Lifestyle  . Physical activity:    Days per week: Not on file    Minutes per session: Not on file  . Stress: Not on file  Relationships  . Social connections:    Talks on phone: Not on file    Gets together: Not on file    Attends religious service: Not on file    Active member of club or organization: Not on file    Attends meetings of clubs or organizations: Not on file    Relationship status: Not on file  . Intimate partner violence:    Fear of current or ex partner: Not on file    Emotionally abused: Not on file    Physically abused: Not on file    Forced sexual activity: Not on file  Other Topics Concern  . Not on file  Social History Narrative   Widowed.  Never smoker.  Has history of alcohol abuse with vodka being beverage of choice.    Pertinent  Health Maintenance Due  Topic Date Due  . COLONOSCOPY  10/04/2000  . INFLUENZA VACCINE  03/20/2018  . PNA vac Low Risk Adult (2 of 2 - PCV13) 05/20/2018    Medications: Allergies as of 12/30/2017      Reactions   Fish-derived Products Anaphylaxis  Sulfamethizole Rash      Medication List        Accurate as of 12/30/17  2:06 PM. Always use your most recent med list.          aspirin 81 MG chewable tablet Chew 81 mg by mouth daily.   bisacodyl 5 MG EC tablet Commonly known as:  DULCOLAX Take 5 mg by mouth daily as needed (Drug-induced constipation).   busPIRone 10 MG tablet Commonly known as:  BUSPAR Take 10 mg by mouth 3 (three) times daily.   carbidopa-levodopa 25-100 MG tablet Commonly known as:  SINEMET IR Take 1 tablet by mouth 3 (three) times daily.   carvedilol 3.125 MG tablet Commonly known as:  COREG Take 3.125 mg by mouth 2 (two) times daily with a meal.   fentaNYL 25 MCG/HR patch Commonly known as:  DURAGESIC - dosed mcg/hr Place 25 mcg onto the skin every 3 (three) days.   FLUoxetine 40 MG capsule Commonly known as:  PROZAC Take 40 mg by mouth daily.   furosemide 20 MG  tablet Commonly known as:  LASIX Take 40 mg by mouth daily.   gabapentin 300 MG capsule Commonly known as:  NEURONTIN Take 300 mg by mouth 3 (three) times daily.   mirtazapine 45 MG tablet Commonly known as:  REMERON Take 45 mg by mouth at bedtime.   pantoprazole 40 MG tablet Commonly known as:  PROTONIX Take 40 mg by mouth daily.   polyethylene glycol packet Commonly known as:  MIRALAX / GLYCOLAX Take 17 g by mouth daily.   potassium chloride 10 MEQ tablet Commonly known as:  K-DUR,KLOR-CON Take 10 mEq by mouth daily.   QUEtiapine 100 MG tablet Commonly known as:  SEROQUEL Take 150 mg by mouth at bedtime. Take 1-1/2 tablets to - 150 mg for depressive disorder   QUEtiapine 50 MG tablet Commonly known as:  SEROQUEL Take 50 mg by mouth at bedtime.   Vitamin D3 1000 units Caps Take 1 capsule by mouth 3 (three) times daily.        Vitals:   12/30/17 1401  BP: 103/64  Pulse: 84  Resp: 16  Temp: 98 F (36.7 C)  TempSrc: Oral  SpO2: 97%  Weight: 238 lb 6.4 oz (108.1 kg)  Height:  (1.803 m)   Body mass index is 33.25 kg/m.  Physical Exam  GENERAL APPEARANCE: Alert, conversant. No acute distress.  HEENT: Unremarkable. RESPIRATORY: Breathing is even, unlabored. Lung sounds are clear   CARDIOVASCULAR: Heart RRR no murmurs, rubs or gallops. No peripheral edema.  GASTROINTESTINAL: Abdomen is soft, non-tender, not distended w/ normal bowel sounds.  NEUROLOGIC: Cranial nerves 2-12 grossly intact. Moves all extremities   Labs reviewed: Basic Metabolic Panel: Recent Labs    12/17/17  NA 144  K 3.6  BUN 11  CREATININE 0.6   No results found for: [facility] Liver Function Tests: No results for input(s): AST, ALT, ALKPHOS, BILITOT, PROT, ALBUMIN in the last 8760 hours. No results for input(s): LIPASE, AMYLASE in the last 8760 hours. No results for input(s): AMMONIA in the last 8760 hours. CBC: Recent Labs    12/17/17  WBC 5.3  HGB 11.9*  HCT 35*   PLT 171   Lipid No results for input(s): CHOL, HDL, LDLCALC, TRIG in the last 8760 hours. Cardiac Enzymes: No results for input(s): CKTOTAL, CKMB, CKMBINDEX, TROPONINI in the last 8760 hours. BNP: No results for input(s): BNP in the last 8760 hours. CBG: No results for input(s): GLUCAP  in the last 8760 hours.  Procedures and Imaging Studies During Stay: No results found.  Assessment/Plan:   No diagnosis found.   Patient is being discharged with the following home health services:    Patient is being discharged with the following durable medical equipment:    Patient has been advised to f/u with their PCP in 1-2 weeks to bring them up to date on their rehab stay.  Social services at facility was responsible for arranging this appointment.  Pt was provided with a 30 day supply of prescriptions for medications and refills must be obtained from their PCP.  For controlled substances, a more limited supply may be provided adequate until PCP appointment only.  Future labs/tests needed:   Raymond White. Lyn Hollingshead, MD

## 2017-12-30 NOTE — Progress Notes (Signed)
Location:  [facility] Room Number: [phone] Place of Service:  SNF ([id number])  PCP: No primary care provider on file. No care team member to display  Extended Emergency Contact Information Primary Emergency Contact: Cecille Po Home Phone: [phone] Relation: Other  Allergies  Allergen Reactions  . Fish-Derived Products Anaphylaxis  . Sulfamethizole Rash    Chief Complaint  Patient presents with  . Discharge Note    Discharged from SNF    HPI:  67 y.o. male with depression, hypertension, GERD, history of low back pain status post T11-T12 fracture with L2-L5 laminectomy who was admitted to [facility] from 4/10-25 where he underwent numerous imaging studies and a thorough neurologic examination with neurology for back pain and subjective neurologic symptoms that could not be explained by imaging or by work-up.  Of note the patient had been diagnosed with conversion disorder in the past.  Hospital course was complicated by Klebsiella UTI which was treated with ciprofloxacin and Rocephin and is considered fully treated.  Patient was admitted to skilled services nursing facility for OT/PT and is now ready to be discharged home. Past Medical History:  Diagnosis Date  . Acute cystitis with hematuria   . Acute midline low back pain with sciatica   . Chronic heart failure with preserved ejection fraction (HCC)   . DDD (degenerative disc disease), lumbar   . Depressive disorder   . Drug induced constipation   . GERD (gastroesophageal reflux disease)   . History of TIA (transient ischemic attack)   . Hypertension   . Movement disorder   . Uncomplicated alcohol dependence (HCC)   . Urinary incontinence     Past Surgical History:  Procedure Laterality Date  . BACK SURGERY       reports that he has never smoked. He has never used smokeless tobacco. He reports that he drank alcohol. His drug history is not on file. Social History    Socioeconomic History  . Marital status: Widowed    Spouse name: Not on file  . Number of children: Not on file  . Years of education: Not on file  . Highest education level: Not on file  Occupational History  . Not on file  Social Needs  . Financial resource strain: Not on file  . Food insecurity:    Worry: Not on file    Inability: Not on file  . Transportation needs:    Medical: Not on file    Non-medical: Not on file  Tobacco Use  . Smoking status: Never Smoker  . Smokeless tobacco: Never Used  Substance and Sexual Activity  . Alcohol use: Not Currently    Comment: Vodka  . Drug use: Not on file  . Sexual activity: Not on file  Lifestyle  . Physical activity:    Days per week: Not on file    Minutes per session: Not on file  . Stress: Not on file  Relationships  . Social connections:    Talks on phone: Not on file    Gets together: Not on file    Attends religious service: Not on file    Active member of club or organization: Not on file    Attends meetings of clubs or organizations: Not on file    Relationship status: Not on file  . Intimate partner violence:    Fear of current or ex partner: Not on file    Emotionally abused: Not on file    Physically  abused: Not on file    Forced sexual activity: Not on file  Other Topics Concern  . Not on file  Social History Narrative   Widowed.  Never smoker.  Has history of alcohol abuse with vodka being beverage of choice.    Pertinent  Health Maintenance Due  Topic Date Due  . COLONOSCOPY  10/04/2000  . INFLUENZA VACCINE  03/20/2018  . PNA vac Low Risk Adult (2 of 2 - PCV13) 05/20/2018    Medications: Allergies as of 12/30/2017      Reactions   Fish-derived Products Anaphylaxis   Sulfamethizole Rash      Medication List        Accurate as of 12/30/17 11:59 PM. Always use your most recent med list.          aspirin 81 MG chewable tablet Chew 81 mg by mouth daily.   bisacodyl 5 MG EC  tablet Commonly known as:  DULCOLAX Take 5 mg by mouth daily as needed (Drug-induced constipation).   busPIRone 10 MG tablet Commonly known as:  BUSPAR Take 10 mg by mouth 3 (three) times daily.   carbidopa-levodopa 25-100 MG tablet Commonly known as:  SINEMET IR Take 1 tablet by mouth 3 (three) times daily.   carvedilol 3.125 MG tablet Commonly known as:  COREG Take 3.125 mg by mouth 2 (two) times daily with a meal.   fentaNYL 25 MCG/HR patch Commonly known as:  DURAGESIC - dosed mcg/hr Place 25 mcg onto the skin every 3 (three) days.   FLUoxetine 40 MG capsule Commonly known as:  PROZAC Take 40 mg by mouth daily.   furosemide 20 MG tablet Commonly known as:  LASIX Take 40 mg by mouth daily.   gabapentin 300 MG capsule Commonly known as:  NEURONTIN Take 300 mg by mouth 3 (three) times daily.   mirtazapine 45 MG tablet Commonly known as:  REMERON Take 45 mg by mouth at bedtime.   pantoprazole 40 MG tablet Commonly known as:  PROTONIX Take 40 mg by mouth daily.   polyethylene glycol packet Commonly known as:  MIRALAX / GLYCOLAX Take 17 g by mouth daily.   potassium chloride 10 MEQ tablet Commonly known as:  K-DUR,KLOR-CON Take 10 mEq by mouth daily.   QUEtiapine 100 MG tablet Commonly known as:  SEROQUEL Take 150 mg by mouth at bedtime. Take 1-1/2 tablets to - 150 mg for depressive disorder   QUEtiapine 50 MG tablet Commonly known as:  SEROQUEL Take 50 mg by mouth at bedtime.   Vitamin D3 1000 units Caps Take 1 capsule by mouth 3 (three) times daily.        Vitals:   12/30/17 1401  BP: 103/64  Pulse: 84  Resp: 16  Temp: 98 F (36.7 C)  TempSrc: Oral  SpO2: 97%  Weight: 238 lb 6.4 oz (108.1 kg)  Height:  (1.803 m)   Body mass index is 33.25 kg/m.  Physical Exam  GENERAL APPEARANCE: Alert, conversant. No acute distress.  HEENT: Unremarkable. RESPIRATORY: Breathing is even, unlabored. Lung sounds are clear   CARDIOVASCULAR: Heart  RRR no murmurs, rubs or gallops. No peripheral edema.  GASTROINTESTINAL: Abdomen is soft, non-tender, not distended w/ normal bowel sounds.  NEUROLOGIC: Cranial nerves 2-12 grossly intact. Moves all extremities   Labs reviewed: Basic Metabolic Panel: Recent Labs    12/17/17  NA 144  K 3.6  BUN 11  CREATININE 0.6   No results found for: [facility] Liver Function Tests: No results for  input(s): AST, ALT, ALKPHOS, BILITOT, PROT, ALBUMIN in the last 8760 hours. No results for input(s): LIPASE, AMYLASE in the last 8760 hours. No results for input(s): AMMONIA in the last 8760 hours. CBC: Recent Labs    12/17/17  WBC 5.3  HGB 11.9*  HCT 35*  PLT 171   Lipid No results for input(s): CHOL, HDL, LDLCALC, TRIG in the last 8760 hours. Cardiac Enzymes: No results for input(s): CKTOTAL, CKMB, CKMBINDEX, TROPONINI in the last 8760 hours. BNP: No results for input(s): BNP in the last 8760 hours. CBG: No results for input(s): GLUCAP in the last 8760 hours.  Procedures and Imaging Studies During Stay: No results found.  Assessment/Plan:   DDD (degenerative disc disease), lumbar  Klebsiella cystitis  Movement disorder  Chronic heart failure with preserved ejection fraction (HCC)  Anxiety  Depressive disorder  Essential hypertension  Gastroesophageal reflux disease without esophagitis   Patient is being discharged with the following home health services: OT/PT/nursing  Patient is being discharged with the following durable medical equipment: None  Patient has been advised to f/u with their PCP in 1-2 weeks to bring them up to date on their rehab stay.  Social services at facility was responsible for arranging this appointment.  Pt was provided with a 30 day supply of prescriptions for medications and refills must be obtained from their PCP.  For controlled substances, a more limited supply may be provided adequate until PCP appointment only.  Medications have been  reconciled.  Time spent greater than 30 minutes;> 50% of time with patient was spent reviewing records, labs, tests and studies, counseling and developing plan of care  Merrilee Seashore, MD

## 2018-01-05 ENCOUNTER — Encounter: Payer: Self-pay | Admitting: Internal Medicine

## 2018-01-12 ENCOUNTER — Encounter: Payer: Self-pay | Admitting: Internal Medicine

## 2018-01-12 NOTE — Progress Notes (Signed)
Location:  [facility]   Place of Service:  SNF [phone]) Provider: Margit Hanks MD  No primary care provider on file.  No care team member to display  Extended Emergency Contact Information Primary Emergency Contact: Cecille Po Home Phone: [phone] Relation: Other    Allergies: Fish-derived products and Sulfamethizole  Chief Complaint  Patient presents with  . Acute Visit    HPI: Patient is 67 y.o. male who nursing asked me to see.  Patient got short of breath and therapy and it was noted that patient had pedal edema this morning.  I am seeing patient he said his feet up and there is no edema, patient denies shortness of breath and he has no rails.  Past Medical History:  Diagnosis Date  . Acute cystitis with hematuria   . Acute midline low back pain with sciatica   . Chronic heart failure with preserved ejection fraction (HCC)   . DDD (degenerative disc disease), lumbar   . Depressive disorder   . Drug induced constipation   . GERD (gastroesophageal reflux disease)   . History of TIA (transient ischemic attack)   . Hypertension   . Movement disorder   . Uncomplicated alcohol dependence (HCC)   . Urinary incontinence     Past Surgical History:  Procedure Laterality Date  . BACK SURGERY      Allergies as of 12/25/2017      Reactions   Fish-derived Products Anaphylaxis   Sulfamethizole Rash      Medication List        Accurate as of 12/25/17 11:59 PM. Always use your most recent med list.          aspirin 81 MG chewable tablet Chew 81 mg by mouth daily.   bisacodyl 5 MG EC tablet Commonly known as:  DULCOLAX Take 5 mg by mouth daily as needed (Drug-induced constipation).   busPIRone 10 MG tablet Commonly known as:  BUSPAR Take 10 mg by mouth 3 (three) times daily.   carbidopa-levodopa 25-100 MG tablet Commonly known as:  SINEMET IR Take 1 tablet by mouth 3 (three) times daily.   carvedilol 3.125 MG tablet Commonly known  as:  COREG Take 3.125 mg by mouth 2 (two) times daily with a meal.   fentaNYL 25 MCG/HR patch Commonly known as:  DURAGESIC - dosed mcg/hr Place 25 mcg onto the skin every 3 (three) days.   FLUoxetine 40 MG capsule Commonly known as:  PROZAC Take 40 mg by mouth daily.   furosemide 20 MG tablet Commonly known as:  LASIX Take 40 mg by mouth daily.   gabapentin 300 MG capsule Commonly known as:  NEURONTIN Take 300 mg by mouth 3 (three) times daily.   mirtazapine 45 MG tablet Commonly known as:  REMERON Take 45 mg by mouth at bedtime.   oxyCODONE 5 MG immediate release tablet Commonly known as:  Oxy IR/ROXICODONE Take 5 mg by mouth every 6 (six) hours as needed for severe pain.   pantoprazole 40 MG tablet Commonly known as:  PROTONIX Take 40 mg by mouth daily.   polyethylene glycol packet Commonly known as:  MIRALAX / GLYCOLAX Take 17 g by mouth daily.   QUEtiapine 100 MG tablet Commonly known as:  SEROQUEL Take 150 mg by mouth at bedtime. Take 1-1/2 tablets to - 150 mg for depressive disorder   QUEtiapine 50 MG tablet Commonly known as:  SEROQUEL Take 50 mg by mouth at bedtime.   traMADol 50 MG  tablet Commonly known as:  ULTRAM Take 50 mg by mouth every 6 (six) hours as needed.   Vitamin D3 1000 units Caps Take 1 capsule by mouth 3 (three) times daily.       No orders of the defined types were placed in this encounter.   Immunization History  Administered Date(s) Administered  . Influenza-Unspecified 05/20/2017  . Pneumococcal-Unspecified 05/20/2017    Social History   Tobacco Use  . Smoking status: Never Smoker  . Smokeless tobacco: Never Used  Substance Use Topics  . Alcohol use: Not Currently    Comment: Vodka    Review of Systems  DATA OBTAINED: from patient, nurse-as per history of present illness GENERAL:  no fevers, fatigue, appetite changes SKIN: No itching, rash HEENT: No complaint RESPIRATORY: No cough, wheezing, SOB CARDIAC: No  chest pain, palpitations, lower extremity edema  GI: No abdominal pain, No N/V/D or constipation, No heartburn or reflux  GU: No dysuria, frequency or urgency, or incontinence  MUSCULOSKELETAL: No unrelieved bone/joint pain NEUROLOGIC: No headache, dizziness  PSYCHIATRIC: No overt anxiety or sadness  Vitals:   01/12/18 2119  BP: 113/62  Pulse: 87  Resp: 20  Temp: 99.3 F (37.4 C)   Body mass index is 32.08 kg/m. Physical Exam  GENERAL APPEARANCE: Alert, conversant, No acute distress  SKIN: No diaphoresis rash HEENT: Unremarkable RESPIRATORY: Breathing is even, unlabored. Lung sounds are clear   CARDIOVASCULAR: Heart RRR no murmurs, rubs or gallops. No peripheral edema  GASTROINTESTINAL: Abdomen is soft, non-tender, not distended w/ normal bowel sounds.  GENITOURINARY: Bladder non tender, not distended  MUSCULOSKELETAL: No abnormal joints or musculature NEUROLOGIC: Cranial nerves 2-12 grossly intact. Moves all extremities PSYCHIATRIC: Mood and affect appropriate to situation, no behavioral issues  Patient Active Problem List   Diagnosis Date Noted  . DDD (degenerative disc disease), lumbar 12/15/2017  . Klebsiella cystitis 12/15/2017  . 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  . Chronic heart failure with preserved ejection fraction (HCC) 12/15/2017  . GERD (gastroesophageal reflux disease) 12/15/2017  . Hypertension 12/15/2017  . Depressive disorder 12/15/2017  . Anxiety 12/15/2017    CMP     Component Value Date/Time   NA 144 12/17/2017   K 3.6 12/17/2017   BUN 11 12/17/2017   CREATININE 0.6 12/17/2017   Recent Labs    12/17/17  NA 144  K 3.6  BUN 11  CREATININE 0.6   No results for input(s): AST, ALT, ALKPHOS, BILITOT, PROT, ALBUMIN in the last 8760 hours. Recent Labs    12/17/17  WBC 5.3  HGB 11.9*  HCT 35*  PLT 171   No results for input(s): CHOL, LDLCALC, TRIG in the last 8760 hours.  Invalid input(s): HCL No results found for: MICROALBUR No results  found for: TSH No results found for: HGBA1C No results found for: CHOL, HDL, LDLCALC, LDLDIRECT, TRIG, CHOLHDL  Significant Diagnostic Results in last 30 days:  No results found.  Assessment and Plan  Transient shortness of breath- patient does not have any weight gain that I know of, he does not appear to be fluid overloaded, he has pedal edema which is resolved with elevation, patient is already on Lasix; I do not feel like anything needs to be changed at this time; will continue to monitor  No problem-specific Assessment & Plan notes found for this encounter.    Merrilee Seashore, MD

## 2018-05-13 ENCOUNTER — Other Ambulatory Visit: Payer: Self-pay

## 2018-05-13 ENCOUNTER — Emergency Department (HOSPITAL_COMMUNITY)
Admission: EM | Admit: 2018-05-13 | Discharge: 2018-05-13 | Disposition: A | Discharge status: Rehabilitation Facility | Payer: Medicare Other | Attending: Emergency Medicine | Admitting: Emergency Medicine

## 2018-05-13 ENCOUNTER — Emergency Department (HOSPITAL_COMMUNITY): Payer: Medicare Other

## 2018-05-13 ENCOUNTER — Encounter (HOSPITAL_COMMUNITY): Payer: Self-pay | Admitting: Radiology

## 2018-05-13 DIAGNOSIS — R1011 Right upper quadrant pain: Secondary | ICD-10-CM | POA: Diagnosis present

## 2018-05-13 DIAGNOSIS — Z79899 Other long term (current) drug therapy: Secondary | ICD-10-CM | POA: Diagnosis not present

## 2018-05-13 DIAGNOSIS — I509 Heart failure, unspecified: Secondary | ICD-10-CM | POA: Insufficient documentation

## 2018-05-13 DIAGNOSIS — I10 Essential (primary) hypertension: Secondary | ICD-10-CM | POA: Diagnosis not present

## 2018-05-13 DIAGNOSIS — R112 Nausea with vomiting, unspecified: Secondary | ICD-10-CM | POA: Diagnosis not present

## 2018-05-13 LAB — COMPREHENSIVE METABOLIC PANEL
ALBUMIN: 4 g/dL (ref 3.5–5.0)
ALT: 16 U/L (ref 0–44)
AST: 17 U/L (ref 15–41)
Alkaline Phosphatase: 80 U/L (ref 38–126)
Anion gap: 6 (ref 5–15)
BILIRUBIN TOTAL: 1.1 mg/dL (ref 0.3–1.2)
BUN: 11 mg/dL (ref 8–23)
CO2: 31 mmol/L (ref 22–32)
Calcium: 9.4 mg/dL (ref 8.9–10.3)
Chloride: 108 mmol/L (ref 98–111)
Creatinine, Ser: 0.78 mg/dL (ref 0.61–1.24)
GFR calc Af Amer: >60 mL/min (ref >60)
GFR calc non Af Amer: >60 mL/min (ref >60)
GLUCOSE: 95 mg/dL (ref 70–99)
POTASSIUM: 4.3 mmol/L (ref 3.5–5.1)
Sodium: 145 mmol/L (ref 135–145)
TOTAL PROTEIN: 6.7 g/dL (ref 6.5–8.1)

## 2018-05-13 LAB — CBC WITH DIFFERENTIAL/PLATELET
BASOS ABS: 0 10*3/uL (ref 0.0–0.1)
Basophils Relative: 0 %
EOS PCT: 1 %
Eosinophils Absolute: 0 10*3/uL (ref 0.0–0.7)
HCT: 44.3 % (ref 39.0–52.0)
Hemoglobin: 14.5 g/dL (ref 13.0–17.0)
LYMPHS ABS: 0.9 10*3/uL (ref 0.7–4.0)
LYMPHS PCT: 19 %
MCH: 31.2 pg (ref 26.0–34.0)
MCHC: 32.7 g/dL (ref 30.0–36.0)
MCV: 95.3 fL (ref 78.0–100.0)
MONO ABS: 0.4 10*3/uL (ref 0.1–1.0)
MONOS PCT: 9 %
Neutro Abs: 3.5 10*3/uL (ref 1.7–7.7)
Neutrophils Relative %: 71 %
PLATELETS: 163 10*3/uL (ref 150–400)
RBC: 4.65 MIL/uL (ref 4.22–5.81)
RDW: 14.6 % (ref 11.5–15.5)
WBC: 4.9 10*3/uL (ref 4.0–10.5)

## 2018-05-13 LAB — LIPASE, BLOOD: Lipase: 28 U/L (ref 11–51)

## 2018-05-13 MED ORDER — IOPAMIDOL (ISOVUE-300) INJECTION 61%
INTRAVENOUS | Status: AC
  Filled 2018-05-13: qty 100

## 2018-05-13 MED ORDER — MORPHINE SULFATE (PF) 4 MG/ML IV SOLN
8.0000 mg | Freq: Once | INTRAVENOUS | Status: AC
  Administered 2018-05-13: 8 mg via INTRAVENOUS
  Filled 2018-05-13: qty 2

## 2018-05-13 MED ORDER — IOPAMIDOL (ISOVUE-300) INJECTION 61%
100.0000 mL | Freq: Once | INTRAVENOUS | Status: AC | PRN
  Administered 2018-05-13: 100 mL via INTRAVENOUS

## 2018-05-13 MED ORDER — ONDANSETRON HCL 4 MG/2ML IJ SOLN
4.0000 mg | Freq: Once | INTRAMUSCULAR | Status: AC
  Administered 2018-05-13: 4 mg via INTRAVENOUS
  Filled 2018-05-13: qty 2

## 2018-05-13 NOTE — Discharge Instructions (Addendum)
We saw you in the ER for the abdominal pain. °All the results in the ER are normal, labs and imaging. °We are not sure what is causing your symptoms. ° °The workup in the ER is not complete, and is limited to screening for life threatening and emergent conditions only, so please see a primary care doctor for further evaluation. ° °

## 2018-05-13 NOTE — ED Notes (Signed)
[facility] has been called. Papers in bin.

## 2018-05-13 NOTE — ED Notes (Signed)
Bed: WA20 Expected date:  Expected time:  Means of arrival:  Comments: EMS-abdominal pain-vomiting

## 2018-05-13 NOTE — ED Triage Notes (Signed)
[facility] Pt reports RLQ pain for 1 week. Pt reports Nausea and diarrhea. Pt denies vomiting. Pt reports US completed yesterday, report showed enlarged liver but no other findings.

## 2018-05-13 NOTE — ED Notes (Signed)
Patient came back from CT scan and c/o of nausea and clammy feeling. MD notified and verbal zofran ordered.

## 2018-05-13 NOTE — ED Provider Notes (Signed)
[facility]-EMERGENCY DEPT Provider Note   CSN: [id number] Arrival date & time: 05/13/18  4098     History   Chief Complaint Chief Complaint  Patient presents with  . Abdominal Pain    HPI JEMELL TOWN is a 67 y.o. male.  HPI  67 y/o M comes in with cc of abd pain. He has hx of CHF, constipation and chronic back pain. PT reports that he has been having RLQ abd pain x 1 week. Pain is constant, severe and there is associated n/v/d. Pt also feels that his abd is distended. Home pain meds has not provided significant relief. Pt also denies uti like symptoms.  Pt is s/p appendectomy and cholecystectomy.  Past Medical History:  Diagnosis Date  . Acute cystitis with hematuria   . Acute midline low back pain with sciatica   . Chronic heart failure with preserved ejection fraction (HCC)   . DDD (degenerative disc disease), lumbar   . Depressive disorder   . Drug induced constipation   . GERD (gastroesophageal reflux disease)   . History of TIA (transient ischemic attack)   . Hypertension   . Movement disorder   . Uncomplicated alcohol dependence (HCC)   . Urinary incontinence     Patient Active Problem List   Diagnosis Date Noted  . DDD (degenerative disc disease), lumbar 12/15/2017  . Klebsiella cystitis 12/15/2017  . 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  . Chronic heart failure with preserved ejection fraction (HCC) 12/15/2017  . GERD (gastroesophageal reflux disease) 12/15/2017  . Hypertension 12/15/2017  . Depressive disorder 12/15/2017  . Anxiety 12/15/2017    Past Surgical History:  Procedure Laterality Date  . BACK SURGERY          Home Medications    Prior to Admission medications   Medication Sig Start Date End Date Taking? Authorizing Provider  albuterol (PROVENTIL HFA;VENTOLIN HFA) 108 (90 Base) MCG/ACT inhaler Inhale 2 puffs into the lungs every 6 (six) hours as needed for wheezing or shortness of breath. Wait 1 minute in between  each puff   Yes [provider]  aspirin 81 MG chewable tablet Chew 81 mg by mouth daily.   Yes [provider]  atorvastatin (LIPITOR) 80 MG tablet Take 80 mg by mouth every evening.   Yes [provider]  bisacodyl (DULCOLAX) 10 MG suppository Place 10 mg rectally daily as needed for moderate constipation.   Yes [provider]  busPIRone (BUSPAR) 10 MG tablet Take 10 mg by mouth 3 (three) times daily.   Yes [provider]  carbidopa-levodopa (SINEMET IR) 25-100 MG tablet Take 2 tablets by mouth 3 (three) times daily.    Yes [provider]  carvedilol (COREG) 3.125 MG tablet Take 3.125 mg by mouth 2 (two) times daily with a meal.   Yes [provider]  clopidogrel (PLAVIX) 75 MG tablet Take 75 mg by mouth daily.   Yes [provider]  cyanocobalamin (,VITAMIN B-12,) 1000 MCG/ML injection Inject 1,000 mcg into the muscle every 30 (thirty) days.   Yes [provider]  DULoxetine (CYMBALTA) 30 MG capsule Take 30 mg by mouth daily.   Yes [provider]  furosemide (LASIX) 20 MG tablet Take 40 mg by mouth daily.    Yes [provider]  gabapentin (NEURONTIN) 600 MG tablet Take 600-1,200 mg by mouth 2 (two) times daily. Take one tablet (600mg ) every morning and take 2 tablets (1200mg ) every evening   Yes [provider]  ipratropium-albuterol (DUONEB) 0.5-2.5 (3) MG/3ML SOLN Take 3 mLs by nebulization every 6 (six) hours as needed (wheezing, SOB).   Yes [provider]  oxyCODONE (OXY IR/ROXICODONE) 5 MG immediate release tablet Take 5 mg by mouth every 6 (six) hours as needed for breakthrough pain.   Yes [provider]  polyethylene glycol (MIRALAX / GLYCOLAX) packet Take 17 g by mouth daily. Hold for loose stools   Yes [provider]  potassium chloride SA (K-DUR,KLOR-CON) 20 MEQ tablet Take 20 mEq by mouth daily. With food   Yes [provider]    QUEtiapine (SEROQUEL) 50 MG tablet Take 50 mg by mouth at bedtime.   Yes [provider]  ranitidine (ZANTAC) 150 MG tablet Take 150 mg by mouth 2 (two) times daily.   Yes [provider]  senna-docusate (SENOKOT-S) 8.6-50 MG tablet Take 1 tablet by mouth daily as needed for mild constipation.   Yes [provider]  tamsulosin (FLOMAX) 0.4 MG CAPS capsule Take 0.4 mg by mouth at bedtime.   Yes [provider]  tiZANidine (ZANAFLEX) 4 MG tablet Take 4 mg by mouth 3 (three) times daily as needed for muscle spasms.   Yes [provider]    Family History Family History  Problem Relation Age of Onset  . Hyperlipidemia Mother   . Cancer Father     Social History Social History   Tobacco Use  . Smoking status: Never Smoker  . Smokeless tobacco: Never Used  Substance Use Topics  . Alcohol use: Not Currently    Comment: Vodka  . Drug use: Not on file     Allergies   Fish-derived products and Sulfamethizole   Review of Systems Review of Systems  Constitutional: Positive for activity change.  Respiratory: Negative for shortness of breath.   Cardiovascular: Negative for chest pain.  Gastrointestinal: Positive for abdominal pain, nausea and vomiting.  Allergic/Immunologic: Negative for immunocompromised state.  All other systems reviewed and are negative.    Physical Exam Updated Vital Signs BP (!) 156/99   Pulse (!) 55   Temp 97.8 F (36.6 C) (Oral)   Resp 17   Wt 106.6 kg   SpO2 94%   BMI 32.78 kg/m   Physical Exam  Constitutional: He is oriented to person, place, and time. He appears well-developed.  HENT:  Head: Atraumatic.  Neck: Neck supple.  Cardiovascular: Normal rate.  Pulmonary/Chest: Effort normal.  Abdominal: Bowel sounds are normal. He exhibits no ascites. There is tenderness in the right upper quadrant. There is guarding.  Neurological: He is alert and oriented to person, place, and time.  Skin: Skin is  warm.  Nursing note and vitals reviewed.    ED Treatments / Results  Labs (all labs ordered are listed, but only abnormal results are displayed) Labs Reviewed  COMPREHENSIVE METABOLIC PANEL  CBC WITH DIFFERENTIAL/PLATELET  LIPASE, BLOOD    EKG None  Radiology Ct Abdomen Pelvis W Contrast  Result Date: 05/13/2018 CLINICAL DATA:  RIGHT abdominal pain and swelling for 1 week. Reported liver disease. EXAM: CT ABDOMEN AND PELVIS WITH CONTRAST TECHNIQUE: Multidetector CT imaging of the abdomen and pelvis was performed using the standard protocol following bolus administration of intravenous contrast. CONTRAST:  ISOVUE-300 IOPAMIDOL (ISOVUE-300) INJECTION 61% COMPARISON:  CT abdomen and pelvis November 22, 2017 FINDINGS: LOWER CHEST: Old RIGHT rib fractures with subpleural thickening and scarring. Stable curvilinear RIGHT diaphragmatic calcifications with small fat containing hernia between ninth and tenth ribs. HEPATOBILIARY: Status post cholecystectomy.  Mild biliary dilatation attributed to postsurgical change, liver is otherwise unremarkable. PANCREAS: Normal. SPLEEN: Normal. ADRENALS/URINARY TRACT: Kidneys are orthotopic, demonstrating symmetric enhancement. No nephrolithiasis, hydronephrosis or solid renal masses. 3.8 cm homogeneously hypodense benign-appearing cyst lower pole of kidney. Too small to characterize hypodensities bilateral kidneys. The unopacified ureters are normal in course and caliber. Delayed imaging through the kidneys demonstrates symmetric prompt contrast excretion within the proximal urinary collecting system. Urinary bladder is partially distended and unremarkable. Normal adrenal glands. STOMACH/BOWEL: Small hiatal hernia with air-fluid level. The stomach, small and large bowel are normal in course and caliber without inflammatory changes sensitivity decreased without oral contrast. Mild colonic diverticulosis. The appendix is not discretely identified, however there are no  inflammatory changes in the right lower quadrant. VASCULAR/LYMPHATIC: Aortoiliac vessels are normal in course and caliber. No lymphadenopathy by CT size criteria. Similar 8 mm RIGHT paraesophageal lymph node. REPRODUCTIVE: Mild prostatomegaly invading base of bladder. OTHER: No intraperitoneal free fluid or free air. Large LEFT and moderate RIGHT fat containing inguinal hernias. MUSCULOSKELETAL: Nonacute. Anterior abdominal wall scarring and calcifications. L3-4 through L5-S1 laminectomies. Severe lumbar spondylosis, grade 1 L2-3 and L3-4 retrolisthesis. IMPRESSION: 1. No acute intra-abdominal/pelvic process. 2. Status post cholecystectomy, mild postoperative biliary dilatation. 3. Colonic diverticulosis. Aortic Atherosclerosis (ICD10-I70.0). Electronically Signed   By: Awilda Metroourtnay  Bloomer M.D.   On: 05/13/2018 14:14    Procedures Procedures (including critical care time)  Medications Ordered in ED Medications  iopamidol (ISOVUE-300) 61 % injection (has no administration in time range)  morphine 4 MG/ML injection 8 mg (8 mg Intravenous Given 05/13/18 1157)  iopamidol (ISOVUE-300) 61 % injection 100 mL (100 mLs Intravenous Contrast Given 05/13/18 1346)  ondansetron (ZOFRAN) injection 4 mg (4 mg Intravenous Given 05/13/18 1411)     Initial Impression / Assessment and Plan / ED Course  I have reviewed the triage vital signs and the nursing notes.  Pertinent labs & imaging results that were available during my care of the patient were reviewed by me and considered in my medical decision making (see chart for details).  Clinical Course as of May 14 1451  Tue May 13, 2018  1452 Results from the ER workup discussed with the patient face to face and all questions answered to the best of my ability.  Patient gets care at the [facility], we have advised him to follow-up with them.  CT ABDOMEN PELVIS W CONTRAST [AN]    Clinical Course User Index [AN] Derwood KaplanNanavati, Duane Earnshaw, MD    67 year old male comes in with chief  complaint of abdominal pain.  Patient has right-sided abdominal pain and he is status post cholecystectomy and appendectomy.  Patient also had some kind of surgery done for the right side of his diaphragm.  On exam patient has significant guarding on the right side.  There is no rash that would make us think of shingles, and patient's pain is not consistent with a zoster infection.  Although patient feels like there is enlargement, I do not have any objective evidence of hepatomegaly or ascites.  We also considered PE in the differential diagnosis, however patient does not have any pleuritic component to his pain.   Given that patient has significant discomfort we will get CT scan of the abdomen.  Final Clinical Impressions(s) / ED Diagnoses   Final diagnoses:  Right upper quadrant abdominal pain    ED Discharge Orders    None       Derwood KaplanNanavati, Norinne Jeane, MD 05/13/18 1452

## 2018-06-27 ENCOUNTER — Encounter (HOSPITAL_COMMUNITY): Payer: Self-pay

## 2018-06-27 ENCOUNTER — Emergency Department (HOSPITAL_COMMUNITY): Payer: Medicare Other

## 2018-06-27 ENCOUNTER — Other Ambulatory Visit: Payer: Self-pay

## 2018-06-27 ENCOUNTER — Emergency Department (HOSPITAL_COMMUNITY)
Admission: EM | Admit: 2018-06-27 | Discharge: 2018-06-30 | Disposition: A | Discharge status: Other Acute Inpatient Hospital | Payer: Medicare Other | Attending: Emergency Medicine | Admitting: Emergency Medicine

## 2018-06-27 DIAGNOSIS — R0602 Shortness of breath: Secondary | ICD-10-CM | POA: Insufficient documentation

## 2018-06-27 DIAGNOSIS — Z7982 Long term (current) use of aspirin: Secondary | ICD-10-CM | POA: Diagnosis not present

## 2018-06-27 DIAGNOSIS — K5900 Constipation, unspecified: Secondary | ICD-10-CM | POA: Diagnosis not present

## 2018-06-27 DIAGNOSIS — N390 Urinary tract infection, site not specified: Secondary | ICD-10-CM

## 2018-06-27 DIAGNOSIS — I110 Hypertensive heart disease with heart failure: Secondary | ICD-10-CM | POA: Diagnosis not present

## 2018-06-27 DIAGNOSIS — Z79899 Other long term (current) drug therapy: Secondary | ICD-10-CM | POA: Insufficient documentation

## 2018-06-27 DIAGNOSIS — R319 Hematuria, unspecified: Secondary | ICD-10-CM

## 2018-06-27 DIAGNOSIS — I509 Heart failure, unspecified: Secondary | ICD-10-CM | POA: Diagnosis not present

## 2018-06-27 DIAGNOSIS — R45851 Suicidal ideations: Secondary | ICD-10-CM | POA: Diagnosis not present

## 2018-06-27 DIAGNOSIS — F1099 Alcohol use, unspecified with unspecified alcohol-induced disorder: Secondary | ICD-10-CM | POA: Diagnosis not present

## 2018-06-27 DIAGNOSIS — F332 Major depressive disorder, recurrent severe without psychotic features: Secondary | ICD-10-CM | POA: Insufficient documentation

## 2018-06-27 DIAGNOSIS — R112 Nausea with vomiting, unspecified: Secondary | ICD-10-CM | POA: Diagnosis present

## 2018-06-27 LAB — MAGNESIUM: MAGNESIUM: 1.9 mg/dL (ref 1.7–2.4)

## 2018-06-27 LAB — CBC WITH DIFFERENTIAL/PLATELET
ABS IMMATURE GRANULOCYTES: 0.04 10*3/uL (ref 0.00–0.07)
BASOS PCT: 0 %
Basophils Absolute: 0 10*3/uL (ref 0.0–0.1)
EOS PCT: 0 %
Eosinophils Absolute: 0 10*3/uL (ref 0.0–0.5)
HCT: 42.8 % (ref 39.0–52.0)
Hemoglobin: 13.7 g/dL (ref 13.0–17.0)
Immature Granulocytes: 1 %
Lymphocytes Relative: 6 %
Lymphs Abs: 0.5 10*3/uL — ABNORMAL LOW (ref 0.7–4.0)
MCH: 30.2 pg (ref 26.0–34.0)
MCHC: 32 g/dL (ref 30.0–36.0)
MCV: 94.3 fL (ref 80.0–100.0)
MONO ABS: 0.5 10*3/uL (ref 0.1–1.0)
MONOS PCT: 6 %
NEUTROS ABS: 6.9 10*3/uL (ref 1.7–7.7)
Neutrophils Relative %: 87 %
Platelets: 150 10*3/uL (ref 150–400)
RBC: 4.54 MIL/uL (ref 4.22–5.81)
RDW: 13.8 % (ref 11.5–15.5)
WBC: 7.9 10*3/uL (ref 4.0–10.5)
nRBC: 0 % (ref 0.0–0.2)

## 2018-06-27 LAB — I-STAT CHEM 8, ED
BUN: 19 mg/dL (ref 8–23)
CALCIUM ION: 0.99 mmol/L — AB (ref 1.15–1.40)
CREATININE: 0.7 mg/dL (ref 0.61–1.24)
Chloride: 104 mmol/L (ref 98–111)
GLUCOSE: 138 mg/dL — AB (ref 70–99)
HCT: 40 % (ref 39.0–52.0)
HEMOGLOBIN: 13.6 g/dL (ref 13.0–17.0)
Potassium: 3.9 mmol/L (ref 3.5–5.1)
Sodium: 140 mmol/L (ref 135–145)
TCO2: 27 mmol/L (ref 22–32)

## 2018-06-27 LAB — COMPREHENSIVE METABOLIC PANEL
ALK PHOS: 92 U/L (ref 38–126)
ALT: 9 U/L (ref 0–44)
ANION GAP: 11 (ref 5–15)
AST: 27 U/L (ref 15–41)
Albumin: 3.6 g/dL (ref 3.5–5.0)
BUN: 17 mg/dL (ref 8–23)
CALCIUM: 8.2 mg/dL — AB (ref 8.9–10.3)
CO2: 26 mmol/L (ref 22–32)
CREATININE: 1.01 mg/dL (ref 0.61–1.24)
Chloride: 103 mmol/L (ref 98–111)
Glucose, Bld: 142 mg/dL — ABNORMAL HIGH (ref 70–99)
Potassium: 3.9 mmol/L (ref 3.5–5.1)
Sodium: 140 mmol/L (ref 135–145)
Total Bilirubin: 1.9 mg/dL — ABNORMAL HIGH (ref 0.3–1.2)
Total Protein: 6.4 g/dL — ABNORMAL LOW (ref 6.5–8.1)

## 2018-06-27 LAB — I-STAT CG4 LACTIC ACID, ED
LACTIC ACID, VENOUS: 1.98 mmol/L — AB (ref 0.5–1.9)
Lactic Acid, Venous: 2.07 mmol/L (ref 0.5–1.9)

## 2018-06-27 LAB — BRAIN NATRIURETIC PEPTIDE: B NATRIURETIC PEPTIDE 5: 102.4 pg/mL — AB (ref 0.0–100.0)

## 2018-06-27 MED ORDER — ATORVASTATIN CALCIUM 80 MG PO TABS
80.0000 mg | ORAL_TABLET | Freq: Every evening | ORAL | Status: DC
  Administered 2018-06-28 – 2018-06-29 (×2): 80 mg via ORAL
  Filled 2018-06-27 (×2): qty 1

## 2018-06-27 MED ORDER — BISACODYL 10 MG RE SUPP: 10.0000 mg | Freq: Every day | RECTAL | Status: DC | PRN

## 2018-06-27 MED ORDER — HYDROMORPHONE HCL 1 MG/ML IJ SOLN
1.0000 mg | Freq: Once | INTRAMUSCULAR | Status: AC
  Administered 2018-06-27: 1 mg via INTRAVENOUS
  Filled 2018-06-27: qty 1

## 2018-06-27 MED ORDER — TIZANIDINE HCL 4 MG PO TABS: 4.0000 mg | ORAL_TABLET | Freq: Three times a day (TID) | ORAL | Status: DC | PRN

## 2018-06-27 MED ORDER — FUROSEMIDE 20 MG PO TABS
40.0000 mg | ORAL_TABLET | Freq: Every day | ORAL | Status: DC
  Administered 2018-06-28 – 2018-06-30 (×3): 40 mg via ORAL
  Filled 2018-06-27 (×3): qty 2

## 2018-06-27 MED ORDER — GABAPENTIN 600 MG PO TABS: 600.0000 mg | ORAL_TABLET | Freq: Two times a day (BID) | ORAL | Status: DC

## 2018-06-27 MED ORDER — BUSPIRONE HCL 10 MG PO TABS
10.0000 mg | ORAL_TABLET | Freq: Three times a day (TID) | ORAL | Status: DC
  Administered 2018-06-28 – 2018-06-30 (×8): 10 mg via ORAL
  Filled 2018-06-27 (×8): qty 1

## 2018-06-27 MED ORDER — CARBIDOPA-LEVODOPA 25-100 MG PO TABS
2.0000 | ORAL_TABLET | Freq: Three times a day (TID) | ORAL | Status: DC
  Administered 2018-06-28 – 2018-06-30 (×7): 2 via ORAL
  Filled 2018-06-27 (×11): qty 2

## 2018-06-27 MED ORDER — DOCUSATE SODIUM 100 MG PO CAPS: 100.0000 mg | ORAL_CAPSULE | Freq: Two times a day (BID) | ORAL | 0 refills | Status: DC

## 2018-06-27 MED ORDER — DULOXETINE HCL 30 MG PO CPEP
30.0000 mg | ORAL_CAPSULE | Freq: Every day | ORAL | Status: DC
  Administered 2018-06-28 – 2018-06-30 (×3): 30 mg via ORAL
  Filled 2018-06-27 (×3): qty 1

## 2018-06-27 MED ORDER — ALBUTEROL SULFATE HFA 108 (90 BASE) MCG/ACT IN AERS: 2.0000 | INHALATION_SPRAY | Freq: Four times a day (QID) | RESPIRATORY_TRACT | Status: DC | PRN

## 2018-06-27 MED ORDER — QUETIAPINE FUMARATE 50 MG PO TABS
50.0000 mg | ORAL_TABLET | Freq: Every day | ORAL | Status: DC
  Filled 2018-06-27: qty 1

## 2018-06-27 MED ORDER — POTASSIUM CHLORIDE CRYS ER 20 MEQ PO TBCR
20.0000 meq | EXTENDED_RELEASE_TABLET | Freq: Every day | ORAL | Status: DC
  Administered 2018-06-28 – 2018-06-30 (×3): 20 meq via ORAL
  Filled 2018-06-27 (×3): qty 1

## 2018-06-27 MED ORDER — DIPHENHYDRAMINE HCL 50 MG/ML IJ SOLN
12.5000 mg | Freq: Once | INTRAMUSCULAR | Status: AC
  Administered 2018-06-27: 12.5 mg via INTRAVENOUS
  Filled 2018-06-27: qty 1

## 2018-06-27 MED ORDER — TAMSULOSIN HCL 0.4 MG PO CAPS: 0.4000 mg | ORAL_CAPSULE | Freq: Every day | ORAL | Status: DC

## 2018-06-27 MED ORDER — ALUM & MAG HYDROXIDE-SIMETH 200-200-20 MG/5ML PO SUSP: 30.0000 mL | Freq: Four times a day (QID) | ORAL | Status: DC | PRN

## 2018-06-27 MED ORDER — METOCLOPRAMIDE HCL 5 MG/ML IJ SOLN
10.0000 mg | Freq: Once | INTRAMUSCULAR | Status: AC
  Administered 2018-06-27: 10 mg via INTRAVENOUS
  Filled 2018-06-27: qty 2

## 2018-06-27 MED ORDER — FAMOTIDINE 20 MG PO TABS
20.0000 mg | ORAL_TABLET | Freq: Every day | ORAL | Status: DC
  Administered 2018-06-28 – 2018-06-30 (×3): 20 mg via ORAL
  Filled 2018-06-27 (×3): qty 1

## 2018-06-27 MED ORDER — POLYETHYLENE GLYCOL 3350 17 G PO PACK
17.0000 g | PACK | Freq: Every day | ORAL | Status: DC
  Administered 2018-06-30: 17 g via ORAL
  Filled 2018-06-27 (×2): qty 1

## 2018-06-27 MED ORDER — POLYETHYLENE GLYCOL 3350 17 G PO PACK: 17.0000 g | PACK | Freq: Every day | ORAL | 0 refills | Status: DC

## 2018-06-27 MED ORDER — MAGNESIUM CITRATE PO SOLN
1.0000 | Freq: Once | ORAL | Status: DC
  Filled 2018-06-27: qty 296

## 2018-06-27 MED ORDER — CLOPIDOGREL BISULFATE 75 MG PO TABS
75.0000 mg | ORAL_TABLET | Freq: Every day | ORAL | Status: DC
  Administered 2018-06-28 – 2018-06-30 (×3): 75 mg via ORAL
  Filled 2018-06-27 (×3): qty 1

## 2018-06-27 MED ORDER — DIPHENHYDRAMINE HCL 50 MG/ML IJ SOLN: 25.0000 mg | Freq: Once | INTRAMUSCULAR | Status: DC

## 2018-06-27 MED ORDER — IOHEXOL 300 MG/ML  SOLN
100.0000 mL | Freq: Once | INTRAMUSCULAR | Status: AC | PRN
  Administered 2018-06-27: 100 mL via INTRAVENOUS

## 2018-06-27 MED ORDER — SODIUM CHLORIDE 0.9 % IV BOLUS
1000.0000 mL | Freq: Once | INTRAVENOUS | Status: AC
  Administered 2018-06-27: 1000 mL via INTRAVENOUS

## 2018-06-27 MED ORDER — CARVEDILOL 3.125 MG PO TABS
3.1250 mg | ORAL_TABLET | Freq: Two times a day (BID) | ORAL | Status: DC
  Administered 2018-06-28 – 2018-06-30 (×5): 3.125 mg via ORAL
  Filled 2018-06-27 (×7): qty 1

## 2018-06-27 MED ORDER — ASPIRIN 81 MG PO CHEW
81.0000 mg | CHEWABLE_TABLET | Freq: Every day | ORAL | Status: DC
  Administered 2018-06-28 – 2018-06-30 (×3): 81 mg via ORAL
  Filled 2018-06-27 (×3): qty 1

## 2018-06-27 NOTE — ED Triage Notes (Signed)
Pt arrived via [facility] from [facility]. Pt reports abdominal swelling and coffee ground emesis. Pt has Hx of Parkinson, and CHF. With c/o SOB and 3 lb increase. Pt was given increase in lasix by staff, EMS unsure of amount.

## 2018-06-27 NOTE — ED Provider Notes (Signed)
Emergency Ultrasound Study:   Angiocath insertion Performed by: Dollene Cleveland Consent: Verbal consent/emergent consent obtained. Risks and benefits: risks, benefits and alternatives were discussed Immediately prior to procedure the correct patient, procedure, equipment, support staff and site/side marked as needed.  Indication: difficult IV access Preparation: Patient was prepped and draped in the usual sterile fashion. Sterile gel was used for this procedure and the ultrasound probe was sterilized prior to use. Vein Location: Right forearm vein was visualized during assessment for potential access sites and was found to be patent/ easily compressed with linear ultrasound.  The needle was visualized with real-time ultrasound and guided into the vein. Gauge: 18  Image saved and stored.  Normal blood return.   Patient tolerance: Patient tolerated the procedure well with no immediate complications.        Shaune Pollack, MD 06/27/18 [phone]

## 2018-06-27 NOTE — ED Notes (Signed)
Got patient undress on the monitor patient is resting with call bell in reach in nurse at bedside

## 2018-06-27 NOTE — ED Notes (Signed)
Dr Erma Heritage informed of lactic acid results 2.07

## 2018-06-27 NOTE — Discharge Instructions (Addendum)
It is very important that you are staying well-hydrated with water. Use MiraLAX daily until you are having daily bowel movements. Take Colace daily to help soften stool. Continue taking all your other home medications as prescribed. Follow-up with your primary care doctor next week for reevaluation. Return to the emergency room if you develop high fevers, persistent vomiting, severe worsening abdominal pain, or any new or concerning symptoms.

## 2018-06-27 NOTE — ED Provider Notes (Signed)
[facility] EMERGENCY DEPARTMENT Provider Note   CSN: [id number] Arrival date & time: 06/27/18  1455     History   Chief Complaint Chief Complaint  Patient presents with  . Hematemesis    HPI Raymond White is a 67 y.o. male presenting for evaluation of nausea, vomiting, shortness of breath.  Patient states for the past 2 days, he has been feeling more short of breath and feeling like his abdomen has been swollen.  Today he has had nausea and vomiting.  For the past 2 days, he has had difficulty having a bowel movements, straining a lot only to have very small bowel movements.  Denies fevers, chills, chest pain, or abnormal urination.  Patient states he has some mild central abdominal pain.  Patient states his weight is up 3 pounds from last weight, which was approximately a month ago.  He reports a history of CHF for which he takes Lasix, Parkinson's, hypertension.  Patient has a history of cholecystectomy with subsequent infections requiring skin grafting on the abdomen.  HPI  Past Medical History:  Diagnosis Date  . Acute cystitis with hematuria   . Acute midline low back pain with sciatica   . Chronic heart failure with preserved ejection fraction (HCC)   . DDD (degenerative disc disease), lumbar   . Depressive disorder   . Drug induced constipation   . GERD (gastroesophageal reflux disease)   . History of TIA (transient ischemic attack)   . Hypertension   . Movement disorder   . Uncomplicated alcohol dependence (HCC)   . Urinary incontinence     Patient Active Problem List   Diagnosis Date Noted  . DDD (degenerative disc disease), lumbar 12/15/2017  . Klebsiella cystitis 12/15/2017  . 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  . Chronic heart failure with preserved ejection fraction (HCC) 12/15/2017  . GERD (gastroesophageal reflux disease) 12/15/2017  . Hypertension 12/15/2017  . Depressive disorder 12/15/2017  . Anxiety 12/15/2017    Past Surgical  History:  Procedure Laterality Date  . BACK SURGERY          Home Medications    Prior to Admission medications   Medication Sig Start Date End Date Taking? Authorizing Provider  albuterol (PROVENTIL HFA;VENTOLIN HFA) 108 (90 Base) MCG/ACT inhaler Inhale 2 puffs into the lungs every 6 (six) hours as needed for wheezing or shortness of breath. Wait 1 minute in between each puff    [provider]  aspirin 81 MG chewable tablet Chew 81 mg by mouth daily.    [provider]  atorvastatin (LIPITOR) 80 MG tablet Take 80 mg by mouth every evening.    [provider]  bisacodyl (DULCOLAX) 10 MG suppository Place 10 mg rectally daily as needed for moderate constipation.    [provider]  busPIRone (BUSPAR) 10 MG tablet Take 10 mg by mouth 3 (three) times daily.    [provider]  carbidopa-levodopa (SINEMET IR) 25-100 MG tablet Take 2 tablets by mouth 3 (three) times daily.     [provider]  carvedilol (COREG) 3.125 MG tablet Take 3.125 mg by mouth 2 (two) times daily with a meal.    [provider]  clopidogrel (PLAVIX) 75 MG tablet Take 75 mg by mouth daily.    [provider]  cyanocobalamin (,VITAMIN B-12,) 1000 MCG/ML injection Inject 1,000 mcg into the muscle every 30 (thirty) days.    [provider]  DULoxetine (CYMBALTA) 30 MG capsule Take 30 mg by mouth daily.  [provider]  furosemide (LASIX) 20 MG tablet Take 40 mg by mouth daily.     [provider]  gabapentin (NEURONTIN) 600 MG tablet Take 600-1,200 mg by mouth 2 (two) times daily. Take one tablet (600mg ) every morning and take 2 tablets (1200mg ) every evening    [provider]  ipratropium-albuterol (DUONEB) 0.5-2.5 (3) MG/3ML SOLN Take 3 mLs by nebulization every 6 (six) hours as needed (wheezing, SOB).    [provider]  oxyCODONE (OXY IR/ROXICODONE) 5 MG immediate release tablet Take 5 mg by mouth every  6 (six) hours as needed for breakthrough pain.    [provider]  polyethylene glycol (MIRALAX / GLYCOLAX) packet Take 17 g by mouth daily. Hold for loose stools    [provider]  potassium chloride SA (K-DUR,KLOR-CON) 20 MEQ tablet Take 20 mEq by mouth daily. With food    [provider]  QUEtiapine (SEROQUEL) 50 MG tablet Take 50 mg by mouth at bedtime.    [provider]  ranitidine (ZANTAC) 150 MG tablet Take 150 mg by mouth 2 (two) times daily.    [provider]  senna-docusate (SENOKOT-S) 8.6-50 MG tablet Take 1 tablet by mouth daily as needed for mild constipation.    [provider]  tamsulosin (FLOMAX) 0.4 MG CAPS capsule Take 0.4 mg by mouth at bedtime.    [provider]  tiZANidine (ZANAFLEX) 4 MG tablet Take 4 mg by mouth 3 (three) times daily as needed for muscle spasms.    [provider]    Family History Family History  Problem Relation Age of Onset  . Hyperlipidemia Mother   . Cancer Father     Social History Social History   Tobacco Use  . Smoking status: Never Smoker  . Smokeless tobacco: Never Used  Substance Use Topics  . Alcohol use: Not Currently    Comment: Vodka  . Drug use: Never     Allergies   Fish-derived products and Sulfamethizole   Review of Systems Review of Systems  Respiratory: Positive for shortness of breath.   Gastrointestinal: Positive for abdominal pain, constipation, nausea and vomiting.  All other systems reviewed and are negative.    Physical Exam Updated Vital Signs BP (!) 158/80 (BP Location: Right Arm)   Pulse 92   Temp (!) 97.5 F (36.4 C) (Oral)   Resp 18   Ht 6\' 1"  (1.854 m)   Wt 109.8 kg   SpO2 94%   BMI 31.93 kg/m   Physical Exam  Constitutional: He is oriented to person, place, and time. He appears well-developed and well-nourished. No distress.  In no acute distress  HENT:  Head: Normocephalic and atraumatic.  Eyes: Pupils are  equal, round, and reactive to light. Conjunctivae and EOM are normal.  Neck: Normal range of motion. Neck supple.  Cardiovascular: Normal rate, regular rhythm and intact distal pulses.  Pulmonary/Chest: Effort normal and breath sounds normal. No respiratory distress. He has no wheezes.  Speaking in full sentences.  Clear lung sounds in all fields.  Abdominal: Soft. He exhibits no distension and no mass. There is tenderness. There is no rebound and no guarding.  Large central abdominal scar.  Tenderness palpation surrounding the scar.  Abdomen soft without rigidity, guarding, distention.  Negative rebound.  Musculoskeletal: Normal range of motion.  No leg swelling.  Neurological: He is alert and oriented to person, place, and time.  Skin: Skin is warm and dry. Capillary refill takes less than 2  seconds.  Psychiatric: He has a normal mood and affect.  Nursing note and vitals reviewed.    ED Treatments / Results  Labs (all labs ordered are listed, but only abnormal results are displayed) Labs Reviewed  CBC WITH DIFFERENTIAL/PLATELET - Abnormal; Notable for the following components:      Result Value   Lymphs Abs 0.5 (*)    All other components within normal limits  COMPREHENSIVE METABOLIC PANEL - Abnormal; Notable for the following components:   Glucose, Bld 142 (*)    Calcium 8.2 (*)    Total Protein 6.4 (*)    Total Bilirubin 1.9 (*)    All other components within normal limits  BRAIN NATRIURETIC PEPTIDE - Abnormal; Notable for the following components:   B Natriuretic Peptide 102.4 (*)    All other components within normal limits  I-STAT CG4 LACTIC ACID, ED - Abnormal; Notable for the following components:   Lactic Acid, Venous 1.98 (*)    All other components within normal limits  I-STAT CHEM 8, ED - Abnormal; Notable for the following components:   Glucose, Bld 138 (*)    Calcium, Ion 0.99 (*)    All other components within normal limits  I-STAT CG4 LACTIC ACID, ED -  Abnormal; Notable for the following components:   Lactic Acid, Venous 2.07 (*)    All other components within normal limits  MAGNESIUM    EKG EKG Interpretation  Date/Time:  Friday June 27 2018 14:57:44 EST Ventricular Rate:  95 PR Interval:    QRS Duration: 110 QT Interval:  409 QTC Calculation: 515 R Axis:   89 Text Interpretation:  Sinus rhythm Borderline right axis deviation Prolonged QT interval No old tracing to compare Confirmed by Shaune Pollack [phone]) on 06/27/2018 3:02:48 PM   Radiology Dg Chest 2 View  Result Date: 06/27/2018 CLINICAL DATA:  Short of breath. EXAM: CHEST - 2 VIEW COMPARISON:  07/27/2017 FINDINGS: Cardiac silhouette is normal size.  No mediastinal or hilar masses. Chronic opacity at the right lung base reflects a combination of atelectasis/scarring and pleural thickening with calcifications, as noted on the CT dated 11/22/2017. Remainder of the lungs is clear. No pleural effusion or pneumothorax. Skeletal structures are demineralized but grossly intact. IMPRESSION: 1. No acute cardiopulmonary disease. 2. Chronic scarring/atelectasis at the right lung base. Electronically Signed   By: Amie Portland M.D.   On: 06/27/2018 17:07   Ct Abdomen Pelvis W Contrast  Result Date: 06/27/2018 CLINICAL DATA:  Abdominal pain. Nausea and vomiting. Bloody coffee-ground emesis. Question small bowel obstruction. EXAM: CT ABDOMEN AND PELVIS WITH CONTRAST TECHNIQUE: Multidetector CT imaging of the abdomen and pelvis was performed using the standard protocol following bolus administration of intravenous contrast. CONTRAST:  OMNIPAQUE IOHEXOL 300 MG/ML  SOLN COMPARISON:  CT of the abdomen and pelvis 05/13/2018 FINDINGS: Lower chest: The lung bases are clear without focal nodule, mass, or airspace disease. The right hemidiaphragm is elevated. The heart size is normal. Coronary artery calcifications are present. Calcifications are again noted along the right hemidiaphragm. There  is mild atelectasis or scarring at the right base. Hepatobiliary: The liver is hypodense, compatible with hepatic steatosis. No focal lesions are evident. Cholecystectomy is noted. The common bile duct is within normal limits. Pancreas: Unremarkable. No pancreatic ductal dilatation or surrounding inflammatory changes. Spleen: Normal in size without focal abnormality. Adrenals/Urinary Tract: The adrenal glands are normal bilaterally. A simple cyst at the lower pole of the right kidney measures 3.9 cm. Kidneys and ureters are  otherwise within normal limits. The urinary bladder is mostly collapsed. Stomach/Bowel: The stomach and duodenum are within normal limits. Small bowel is mostly collapsed. Adhesions are suspected without associated obstruction. The terminal ileum is within normal limits. Diverticular changes are present in the ascending colon. Transverse colon is within normal limits. Diverticular changes are present in the distal descending and sigmoid colon. There is moderate stool in the distal sigmoid colon and rectum. Transverse diameter is 7.4 cm. No obstruction is present Vascular/Lymphatic: Atherosclerotic calcifications are present in the aorta without aneurysm. Reproductive: Prostate is unremarkable. Other: Fat herniates into the left inguinal canal. Calcifications are noted along the ventral peritoneum. This may be postsurgical in nature. No other ventral hernia is present. Musculoskeletal: Multilevel degenerative changes are noted in the lumbar spine. Grade 1 retrolisthesis is present at L2-3 and L3-4. Sclerotic changes are worse on the right at L4-5 and on the left at L3-4. Pelvis is within normal limits. The hips are located and normal bilaterally. IMPRESSION: 1. Moderate stool in the distal sigmoid colon and rectum without obstruction. 2. Colonic diverticulosis without diverticulitis. 3. Small bowel is mostly collapsed. 4. Cholecystectomy. 5.  Aortic Atherosclerosis (ICD10-I70.0). 6. Simple cyst at  the lower pole of the right kidney. 7. Chronically elevated right hemidiaphragm. 8. Hepatic steatosis. Electronically Signed   By: Marin Roberts M.D.   On: 06/27/2018 18:38    Procedures Procedures (including critical care time)  Medications Ordered in ED Medications  HYDROmorphone (DILAUDID) injection 1 mg (1 mg Intravenous Given 06/27/18 1534)  sodium chloride 0.9 % bolus 1,000 mL (0 mLs Intravenous Stopped 06/27/18 1713)  metoCLOPramide (REGLAN) injection 10 mg (10 mg Intravenous Given 06/27/18 1557)  diphenhydrAMINE (BENADRYL) injection 12.5 mg (12.5 mg Intravenous Given 06/27/18 1557)  iohexol (OMNIPAQUE) 300 MG/ML solution 100 mL (100 mLs Intravenous Contrast Given 06/27/18 1808)     Initial Impression / Assessment and Plan / ED Course  I have reviewed the triage vital signs and the nursing notes.  Pertinent labs & imaging results that were available during my care of the patient were reviewed by me and considered in my medical decision making (see chart for details).     Patient presenting for evaluation of nausea, vomiting, abdominal pain, constipation.  Additionally, shortness of breath.  Physical exam reassuring, he is afebrile not tachycardic.  Appears nontoxic.  As patient is having vomiting constipation, and abdominal pain near his surgical scar, concern for obstruction.  Lower suspicion for infection.  Shortness of breath likely related to abdominal discomfort, nausea, vomiting.,  Patient with a history of CHF, will obtain BNP, x-ray, and assess further.  Case discussed with attending, Dr. Erma Heritage evaluated the patient.  Labs reassuring, no leukocytosis.  Kidney, liver, pancreatic function reassuring.  BNP 102, doubt CHF exacerbation.  Chest x-ray viewed interpreted by me, no pneumonia, pneumothorax, effusion. Lactic very minimally elevated, doubt sepsis.   CT abdomen pending.   CT negative for obstruction, but shows significant stool burden.  Discussed findings with  patient.  Discussed symptoms are likely due to constipation, and will treat with bowel regimen.  When being informed of his disposition, patient states that he is feeling suicidal.  While he has been in the hospital, the suicidal thoughts have continued.  He states he drank 2 bottles of liquor 2 days ago because of his suicidal thoughts, and if he is sent home he will drink too much in an attempt to .  Patient states his caregiver is taking his money to pay her bills.  When asked if he is feeling suicidal because he does not want to go back to his facility, patient denies. Will consult with TTS. Pt given miralax and mag citrate while waiting.   Pt signed out to Aliene Beams, MD for f/u on TTS.  Final Clinical Impressions(s) / ED Diagnoses   Final diagnoses:  Constipation, unspecified constipation type  Suicidal thoughts    ED Discharge Orders    None       Alveria Apley, PA-C 06/27/18 2217    Shaune Pollack, MD 06/28/18 1954
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
## 2018-06-27 NOTE — ED Notes (Signed)
Patient transported to CT 

## 2018-06-28 MED ORDER — GABAPENTIN 300 MG PO CAPS
600.0000 mg | ORAL_CAPSULE | Freq: Every day | ORAL | Status: DC
  Administered 2018-06-28 – 2018-06-30 (×3): 600 mg via ORAL
  Filled 2018-06-28 (×3): qty 2

## 2018-06-28 MED ORDER — GABAPENTIN 400 MG PO CAPS
1200.0000 mg | ORAL_CAPSULE | Freq: Every day | ORAL | Status: DC
  Administered 2018-06-28 – 2018-06-29 (×3): 1200 mg via ORAL
  Filled 2018-06-28 (×3): qty 3

## 2018-06-28 NOTE — ED Notes (Signed)
Telepsych being performed. 

## 2018-06-28 NOTE — ED Notes (Addendum)
Pt noted to be wearing burgundy scrubs and eyeglasses - states he is fearful [facility] will not allow him to come back if they know what he did - states he snuck out and obtained ETOH and drank it there. States he has been there 2 months and is wanting to stay until he receives his next check next month d/t only has $200 left. Denies SI/HI.

## 2018-06-28 NOTE — ED Notes (Signed)
Snack given as requested.  

## 2018-06-28 NOTE — ED Notes (Signed)
Patient wanded and cleared by security, all belongings inventoried and will be placed in locker when transferred to new room, Purple zone agreement/rules signed by patient. Valuables locked up with security and patient changed into purple scrubs.

## 2018-06-28 NOTE — Progress Notes (Signed)
Patient meets criteria for inpatient treatment per [facility] NP. No appropriate beds available at [facility]. CSW faxed referrals to the following facilities for review:  [facility], [facility], [facility], [facility] [place], [place], [facility] [place], [facility], [address], [place], [place], [place]. [facility], [place], [place], [place].  TTS will continue to seek bed placement.   Trula Slade, MSW, LCSW Clinical Social Worker 06/28/2018 12:02 PM

## 2018-06-28 NOTE — ED Notes (Signed)
Breakfast tray ordered 

## 2018-06-28 NOTE — ED Notes (Signed)
Pt in shower.  

## 2018-06-29 NOTE — BHH Counselor (Signed)
Re-assessment:  Patient came to the hospital Friday 06/27/2018 with medical complaints and complaints of suicidal ideations with no plan. Patient report continues to feel suicidal and since sitting in the hospital he has been thinking of different suicidal plans. Report he tired of jumping from place to place. Report if he leaves he going to go to [employer] a few of his things, go to a motel room drinking alcohol and cut his wrist. Assessor asked if returning to [place] was an option. Patient report he has no family.  Denies homicidal ideations, denies auditory / visual hallucinations.   Reola Calkins, NP, patient continues to meet inpatient criteria.

## 2018-06-29 NOTE — ED Provider Notes (Signed)
Psychiatric Default Provider Note:   Patient is a 67 year old male with a past medical history as below presenting for abdominal distention, reported hematemesis at home, and suicidal ideations.  Patient had extensive work-up for his gastro-intestinal symptoms, and found to have moderate colonic stool burden but no acute findings.  He is currently awaiting psych placement and is voluntary at this time.  No face-to-face evaluation this morning, patient was resting comfortably, had good insight and judgment, and was in no acute distress.  No abdominal symptoms. Patient reports that he has not been taking quetiapine for approximately 1 month due to having tremors in his left leg when he takes it that keeps him up at night.  Vital signs overnight reviewed, and are stable.  Regarding medication management, feel it is appropriate to discontinue quetiapine, as patient is not taking this at home x 1 month, and he may be having extrapyramidal side effects from it given his history of Parkinson's.  Past Medical History:  Diagnosis Date  . Acute cystitis with hematuria   . Acute midline low back pain with sciatica   . Chronic heart failure with preserved ejection fraction (HCC)   . DDD (degenerative disc disease), lumbar   . Depressive disorder   . Drug induced constipation   . GERD (gastroesophageal reflux disease)   . History of TIA (transient ischemic attack)   . Hypertension   . Movement disorder   . Uncomplicated alcohol dependence (HCC)   . Urinary incontinence       Delia Chimes 06/29/18 1610    Geoffery Lyons, MD 06/30/18 [phone]

## 2018-06-29 NOTE — ED Notes (Signed)
Pt. Given snack and a drink

## 2018-06-30 LAB — URINALYSIS, ROUTINE W REFLEX MICROSCOPIC
Bilirubin Urine: NEGATIVE
Glucose, UA: NEGATIVE mg/dL
Ketones, ur: 5 mg/dL — AB
Nitrite: NEGATIVE
Protein, ur: NEGATIVE mg/dL
Specific Gravity, Urine: 1.02 (ref 1.005–1.030)
pH: 6 (ref 5.0–8.0)

## 2018-06-30 MED ORDER — PSYLLIUM 95 % PO PACK
1.0000 | PACK | Freq: Once | ORAL | Status: DC
  Filled 2018-06-30: qty 1

## 2018-06-30 MED ORDER — CEPHALEXIN 250 MG PO CAPS
500.0000 mg | ORAL_CAPSULE | Freq: Two times a day (BID) | ORAL | Status: DC
  Administered 2018-06-30: 500 mg via ORAL
  Filled 2018-06-30: qty 2

## 2018-06-30 NOTE — ED Notes (Signed)
Pt given phone to make phone call.  

## 2018-06-30 NOTE — ED Provider Notes (Addendum)
  67 year old male currently in psychiatric hold.  Please see previous providers note for full H&P.  Patient originally presented with hematemesis and suicidal ideations.  Patient had work-up here and was noted to have moderate colonic stool burden but no other acute findings.  This morning patient is resting comfortably in exam bed and he notes that originally he had constipation but then started to use the bathroom again, he notes that he still passing gas but is constipated once again.  Patient was seen by psychiatry yesterday and continues to meet inpatient criteria.  Additionally notes that starting yesterday he has pain with urinating, denies dysuria change in color or odor.    Physical Exam  BP 129/87   Pulse 85   Temp 97.9 F (36.6 C)   Resp 20   Ht 6\' 1"  (1.854 m)   Wt 109.8 kg   SpO2 98%   BMI 31.93 kg/m   Physical Exam  Constitutional: He is oriented to person, place, and time. He appears well-developed and well-nourished.  HENT:  Head: Normocephalic and atraumatic.  Eyes: Pupils are equal, round, and reactive to light. Conjunctivae are normal. Right eye exhibits no discharge. Left eye exhibits no discharge. No scleral icterus.  Neck: Normal range of motion. No JVD present. No tracheal deviation present.  Pulmonary/Chest: Effort normal. No stridor.  Abdominal: Soft. He exhibits no distension and no mass. There is no tenderness. There is no rebound and no guarding.  Neurological: He is alert and oriented to person, place, and time. Coordination normal.  Psychiatric: He has a normal mood and affect. His behavior is normal. Judgment and thought content normal.  Nursing note and vitals reviewed.   ED Course/Procedures   Clinical Course as of Jun 30 1516  Mon Jun 30, 2018  1212 [place]: 103 [[place]]    Clinical Course User Index [[place]] Couillard, Jobe Marker    Procedures  MDM    68 year old male presents today with continued recommendation of inpatient  criteria.  Patient's urine concerning for urinary tract infection given symptoms.  He will be started on Keflex.  Patient was also given MiraLAX for constipation.  Soft nontender abdomen.  No further concerns at this time.   Patient accepted to inpatient facility.  Patient reexamined prior to transfer no acute distress noted.  Patient ready for transfer.      Eyvonne Mechanic, PA-C 06/30/18 1207    Eyvonne Mechanic, PA-C 06/30/18 1517    Cathren Laine, MD 07/01/18 [phone]

## 2018-06-30 NOTE — Progress Notes (Signed)
Referral information has been faxed to the following hospitals for review:  [facility], [facility], [facility], [facility], and [place]  Referral information has been re-faxed to the following hospitals:  [facility]  [facility]  [facility]  [facility]  [facility]  [facility]  [facility]  [facility]  [facility]  [facility]  [facility]  [facility]   Disposition will continue to assist with placement needs.   Wells Guiles, LCSW, LCAS Disposition CSW [facility] BHH/TTS [phone]

## 2018-06-30 NOTE — ED Notes (Signed)
Pt noted to be ambulatory to bathroom with steady gait and no noted difficulty. Pt calm and now lying back in bed.

## 2018-06-30 NOTE — ED Notes (Signed)
Pt is calm and cooperative, relaxing and watching TV while waiting for tele-psych call. Pt states he will go to bathroom to provide urine specimen and brush his teeth after the call.

## 2018-06-30 NOTE — ED Notes (Signed)
Pt ambulates independently to bathroom, unable to provide urine sample at this time, states he will drink water

## 2018-06-30 NOTE — BHH Counselor (Signed)
TTS Re-assessment: Patient was alert and oriented x4. His speech was normal rate and rhythm. His thought process was logical and coherent. Patient reviewed that he felt even more depressed and anxious since he entered the hospital. He stated that "I really don't care anymore. If I get out of here I'm just going to go drink and cut my wrists." Patient reviewed he was fearful that he burned bridges at [facility] and [facility] due to his substance use. Patient gave permission for TTS to speak with them about his belongings. Patient continues to meet in patient criteria due to SI with plan and intent.

## 2018-06-30 NOTE — ED Notes (Signed)
Called [facility] for [facility]

## 2018-06-30 NOTE — ED Notes (Signed)
Tray ordered.

## 2018-06-30 NOTE — Progress Notes (Signed)
[facility] accepted [facility]. Dr. Richardson Dopp is the accepting/attending provider.   Call report to [phone] Toniann Fail @ [facility] ED notified.    Pt is voluntary and can be transported by [employer].  Pt is scheduled to arrive at [facility] as soon as transportation can be arranged.   Wells Guiles, LCSW, LCAS Disposition CSW [facility] BHH/TTS [phone]

## 2018-10-21 ENCOUNTER — Encounter (HOSPITAL_COMMUNITY): Payer: Self-pay | Admitting: Emergency Medicine

## 2018-10-21 ENCOUNTER — Inpatient Hospital Stay (HOSPITAL_COMMUNITY)
Admission: EM | Admit: 2018-10-21 | Discharge: 2018-10-24 | DRG: 193 | Disposition: A | Discharge status: Skilled Nursing Facility | Payer: Medicare Other | Attending: Internal Medicine | Admitting: Internal Medicine

## 2018-10-21 ENCOUNTER — Emergency Department (HOSPITAL_COMMUNITY): Payer: Medicare Other

## 2018-10-21 ENCOUNTER — Other Ambulatory Visit: Payer: Self-pay

## 2018-10-21 DIAGNOSIS — G20A1 Parkinson's disease without dyskinesia, without mention of fluctuations: Secondary | ICD-10-CM | POA: Diagnosis present

## 2018-10-21 DIAGNOSIS — I110 Hypertensive heart disease with heart failure: Secondary | ICD-10-CM | POA: Diagnosis present

## 2018-10-21 DIAGNOSIS — Z7902 Long term (current) use of antithrombotics/antiplatelets: Secondary | ICD-10-CM

## 2018-10-21 DIAGNOSIS — K219 Gastro-esophageal reflux disease without esophagitis: Secondary | ICD-10-CM | POA: Diagnosis present

## 2018-10-21 DIAGNOSIS — Z8349 Family history of other endocrine, nutritional and metabolic diseases: Secondary | ICD-10-CM

## 2018-10-21 DIAGNOSIS — Z7982 Long term (current) use of aspirin: Secondary | ICD-10-CM

## 2018-10-21 DIAGNOSIS — Z66 Do not resuscitate: Secondary | ICD-10-CM | POA: Diagnosis present

## 2018-10-21 DIAGNOSIS — I10 Essential (primary) hypertension: Secondary | ICD-10-CM | POA: Diagnosis present

## 2018-10-21 DIAGNOSIS — R0902 Hypoxemia: Secondary | ICD-10-CM

## 2018-10-21 DIAGNOSIS — J9601 Acute respiratory failure with hypoxia: Secondary | ICD-10-CM

## 2018-10-21 DIAGNOSIS — J9691 Respiratory failure, unspecified with hypoxia: Secondary | ICD-10-CM | POA: Diagnosis present

## 2018-10-21 DIAGNOSIS — R0602 Shortness of breath: Secondary | ICD-10-CM

## 2018-10-21 DIAGNOSIS — I5032 Chronic diastolic (congestive) heart failure: Secondary | ICD-10-CM | POA: Diagnosis present

## 2018-10-21 DIAGNOSIS — J101 Influenza due to other identified influenza virus with other respiratory manifestations: Secondary | ICD-10-CM | POA: Diagnosis not present

## 2018-10-21 DIAGNOSIS — N400 Enlarged prostate without lower urinary tract symptoms: Secondary | ICD-10-CM | POA: Diagnosis present

## 2018-10-21 DIAGNOSIS — Z882 Allergy status to sulfonamides status: Secondary | ICD-10-CM

## 2018-10-21 DIAGNOSIS — M5136 Other intervertebral disc degeneration, lumbar region: Secondary | ICD-10-CM | POA: Diagnosis present

## 2018-10-21 DIAGNOSIS — Z91013 Allergy to seafood: Secondary | ICD-10-CM

## 2018-10-21 DIAGNOSIS — F329 Major depressive disorder, single episode, unspecified: Secondary | ICD-10-CM | POA: Diagnosis present

## 2018-10-21 DIAGNOSIS — Z8673 Personal history of transient ischemic attack (TIA), and cerebral infarction without residual deficits: Secondary | ICD-10-CM

## 2018-10-21 DIAGNOSIS — G20 Parkinson's disease: Secondary | ICD-10-CM | POA: Diagnosis present

## 2018-10-21 DIAGNOSIS — Z809 Family history of malignant neoplasm, unspecified: Secondary | ICD-10-CM

## 2018-10-21 DIAGNOSIS — R079 Chest pain, unspecified: Secondary | ICD-10-CM

## 2018-10-21 HISTORY — DX: Parkinson's disease: G20

## 2018-10-21 LAB — CBC WITH DIFFERENTIAL/PLATELET
Abs Immature Granulocytes: 0.02 10*3/uL (ref 0.00–0.07)
Basophils Absolute: 0 10*3/uL (ref 0.0–0.1)
Basophils Relative: 0 %
EOS ABS: 0 10*3/uL (ref 0.0–0.5)
Eosinophils Relative: 0 %
HEMATOCRIT: 36.4 % — AB (ref 39.0–52.0)
Hemoglobin: 11.6 g/dL — ABNORMAL LOW (ref 13.0–17.0)
IMMATURE GRANULOCYTES: 0 %
Lymphocytes Relative: 7 %
Lymphs Abs: 0.4 10*3/uL — ABNORMAL LOW (ref 0.7–4.0)
MCH: 30.8 pg (ref 26.0–34.0)
MCHC: 31.9 g/dL (ref 30.0–36.0)
MCV: 96.6 fL (ref 80.0–100.0)
Monocytes Absolute: 0.8 10*3/uL (ref 0.1–1.0)
Monocytes Relative: 14 %
Neutro Abs: 4.5 10*3/uL (ref 1.7–7.7)
Neutrophils Relative %: 79 %
Platelets: 136 10*3/uL — ABNORMAL LOW (ref 150–400)
RBC: 3.77 MIL/uL — ABNORMAL LOW (ref 4.22–5.81)
RDW: 14.1 % (ref 11.5–15.5)
WBC: 5.7 10*3/uL (ref 4.0–10.5)
nRBC: 0 % (ref 0.0–0.2)

## 2018-10-21 LAB — COMPREHENSIVE METABOLIC PANEL
ALT: 18 U/L (ref 0–44)
AST: 19 U/L (ref 15–41)
Albumin: 3.1 g/dL — ABNORMAL LOW (ref 3.5–5.0)
Alkaline Phosphatase: 81 U/L (ref 38–126)
Anion gap: 6 (ref 5–15)
BUN: 12 mg/dL (ref 8–23)
CO2: 28 mmol/L (ref 22–32)
Calcium: 8.3 mg/dL — ABNORMAL LOW (ref 8.9–10.3)
Chloride: 103 mmol/L (ref 98–111)
Creatinine, Ser: 0.89 mg/dL (ref 0.61–1.24)
GFR calc Af Amer: >60 mL/min (ref >60)
GFR calc non Af Amer: >60 mL/min (ref >60)
GLUCOSE: 127 mg/dL — AB (ref 70–99)
Potassium: 3.1 mmol/L — ABNORMAL LOW (ref 3.5–5.1)
Sodium: 137 mmol/L (ref 135–145)
TOTAL PROTEIN: 5.5 g/dL — AB (ref 6.5–8.1)
Total Bilirubin: 1.5 mg/dL — ABNORMAL HIGH (ref 0.3–1.2)

## 2018-10-21 LAB — LACTIC ACID, PLASMA
LACTIC ACID, VENOUS: 1 mmol/L (ref 0.5–1.9)
Lactic Acid, Venous: 2.3 mmol/L (ref 0.5–1.9)
Lactic Acid, Venous: 1.2 mmol/L (ref 0.5–1.9)

## 2018-10-21 LAB — PROTIME-INR
INR: 1.1 (ref 0.8–1.2)
Prothrombin Time: 14.2 seconds (ref 11.4–15.2)

## 2018-10-21 LAB — BRAIN NATRIURETIC PEPTIDE: B Natriuretic Peptide: 321.1 pg/mL — ABNORMAL HIGH (ref 0.0–100.0)

## 2018-10-21 LAB — I-STAT TROPONIN, ED: Troponin i, poc: 0.01 ng/mL (ref 0.00–0.08)

## 2018-10-21 LAB — PROCALCITONIN: Procalcitonin: <0.1 ng/mL

## 2018-10-21 LAB — TROPONIN I
Troponin I: <0.03 ng/mL (ref <0.03)
Troponin I: <0.03 ng/mL (ref <0.03)

## 2018-10-21 LAB — INFLUENZA PANEL BY PCR (TYPE A & B)
Influenza A By PCR: POSITIVE — AB
Influenza B By PCR: NEGATIVE

## 2018-10-21 MED ORDER — SODIUM CHLORIDE 0.9 % IV SOLN
2.0000 g | Freq: Two times a day (BID) | INTRAVENOUS | Status: DC
  Administered 2018-10-21 – 2018-10-22 (×2): 2 g via INTRAVENOUS
  Filled 2018-10-21 (×2): qty 2

## 2018-10-21 MED ORDER — VANCOMYCIN HCL 10 G IV SOLR
2000.0000 mg | Freq: Once | INTRAVENOUS | Status: AC
  Administered 2018-10-21: 2000 mg via INTRAVENOUS
  Filled 2018-10-21: qty 2000

## 2018-10-21 MED ORDER — POLYETHYLENE GLYCOL 3350 17 G PO PACK
17.0000 g | PACK | Freq: Every day | ORAL | Status: DC
  Administered 2018-10-23: 17 g via ORAL
  Filled 2018-10-21 (×2): qty 1

## 2018-10-21 MED ORDER — ACETAMINOPHEN 325 MG PO TABS
650.0000 mg | ORAL_TABLET | Freq: Four times a day (QID) | ORAL | Status: DC | PRN
  Administered 2018-10-21 – 2018-10-22 (×2): 650 mg via ORAL
  Filled 2018-10-21 (×3): qty 2

## 2018-10-21 MED ORDER — CARBIDOPA-LEVODOPA 25-100 MG PO TABS
2.0000 | ORAL_TABLET | Freq: Three times a day (TID) | ORAL | Status: DC
  Administered 2018-10-21 – 2018-10-24 (×10): 2 via ORAL
  Filled 2018-10-21 (×11): qty 2

## 2018-10-21 MED ORDER — CLOPIDOGREL BISULFATE 75 MG PO TABS
75.0000 mg | ORAL_TABLET | Freq: Every day | ORAL | Status: DC
  Administered 2018-10-21 – 2018-10-24 (×4): 75 mg via ORAL
  Filled 2018-10-21 (×4): qty 1

## 2018-10-21 MED ORDER — CEFEPIME HCL 2 G IJ SOLR
2.0000 g | Freq: Once | INTRAMUSCULAR | Status: AC
Start: 2018-10-21 — End: 2018-10-21
  Administered 2018-10-21: 2 g via INTRAVENOUS
  Filled 2018-10-21: qty 2

## 2018-10-21 MED ORDER — VANCOMYCIN HCL IN DEXTROSE 1-5 GM/200ML-% IV SOLN
1000.0000 mg | Freq: Two times a day (BID) | INTRAVENOUS | Status: DC
  Administered 2018-10-21 – 2018-10-22 (×2): 1000 mg via INTRAVENOUS
  Filled 2018-10-21 (×2): qty 200

## 2018-10-21 MED ORDER — CARVEDILOL 6.25 MG PO TABS
6.2500 mg | ORAL_TABLET | Freq: Two times a day (BID) | ORAL | Status: DC
  Administered 2018-10-21 – 2018-10-24 (×6): 6.25 mg via ORAL
  Filled 2018-10-21 (×6): qty 1

## 2018-10-21 MED ORDER — VITAMIN B-1 100 MG PO TABS
100.0000 mg | ORAL_TABLET | Freq: Every day | ORAL | Status: DC
  Administered 2018-10-21 – 2018-10-24 (×4): 100 mg via ORAL
  Filled 2018-10-21 (×4): qty 1

## 2018-10-21 MED ORDER — TAMSULOSIN HCL 0.4 MG PO CAPS
0.4000 mg | ORAL_CAPSULE | Freq: Every day | ORAL | Status: DC
  Administered 2018-10-21 – 2018-10-23 (×3): 0.4 mg via ORAL
  Filled 2018-10-21 (×3): qty 1

## 2018-10-21 MED ORDER — OSELTAMIVIR PHOSPHATE 75 MG PO CAPS: 75.0000 mg | ORAL_CAPSULE | Freq: Two times a day (BID) | ORAL | Status: DC

## 2018-10-21 MED ORDER — DOCUSATE SODIUM 100 MG PO CAPS
100.0000 mg | ORAL_CAPSULE | Freq: Two times a day (BID) | ORAL | Status: DC
  Administered 2018-10-21 – 2018-10-24 (×5): 100 mg via ORAL
  Filled 2018-10-21 (×6): qty 1

## 2018-10-21 MED ORDER — VANCOMYCIN HCL IN DEXTROSE 1-5 GM/200ML-% IV SOLN: 1000.0000 mg | Freq: Once | INTRAVENOUS | Status: DC

## 2018-10-21 MED ORDER — LIDOCAINE 5 % EX PTCH
1.0000 | MEDICATED_PATCH | CUTANEOUS | Status: DC
  Administered 2018-10-21 – 2018-10-24 (×4): 1 via TRANSDERMAL
  Filled 2018-10-21 (×4): qty 1

## 2018-10-21 MED ORDER — ENOXAPARIN SODIUM 40 MG/0.4ML ~~LOC~~ SOLN
40.0000 mg | SUBCUTANEOUS | Status: DC
  Administered 2018-10-21 – 2018-10-24 (×4): 40 mg via SUBCUTANEOUS
  Filled 2018-10-21 (×4): qty 0.4

## 2018-10-21 MED ORDER — IPRATROPIUM-ALBUTEROL 0.5-2.5 (3) MG/3ML IN SOLN
3.0000 mL | Freq: Four times a day (QID) | RESPIRATORY_TRACT | Status: DC
  Administered 2018-10-21 – 2018-10-23 (×10): 3 mL via RESPIRATORY_TRACT
  Filled 2018-10-21 (×10): qty 3

## 2018-10-21 MED ORDER — MORPHINE SULFATE (PF) 2 MG/ML IV SOLN
2.0000 mg | INTRAVENOUS | Status: DC | PRN
  Administered 2018-10-21: 2 mg via INTRAVENOUS
  Filled 2018-10-21: qty 1

## 2018-10-21 MED ORDER — SODIUM CHLORIDE 0.9% FLUSH
3.0000 mL | Freq: Once | INTRAVENOUS | Status: AC
  Administered 2018-10-21: 3 mL via INTRAVENOUS

## 2018-10-21 MED ORDER — OSELTAMIVIR PHOSPHATE 75 MG PO CAPS
75.0000 mg | ORAL_CAPSULE | Freq: Two times a day (BID) | ORAL | Status: DC
  Administered 2018-10-21 – 2018-10-24 (×7): 75 mg via ORAL
  Filled 2018-10-21 (×7): qty 1

## 2018-10-21 MED ORDER — ONDANSETRON HCL 4 MG/2ML IJ SOLN
4.0000 mg | Freq: Four times a day (QID) | INTRAMUSCULAR | Status: DC | PRN
  Administered 2018-10-21 (×2): 4 mg via INTRAVENOUS
  Filled 2018-10-21 (×2): qty 2

## 2018-10-21 MED ORDER — ATORVASTATIN CALCIUM 80 MG PO TABS
80.0000 mg | ORAL_TABLET | Freq: Every evening | ORAL | Status: DC
  Administered 2018-10-21 – 2018-10-23 (×3): 80 mg via ORAL
  Filled 2018-10-21 (×3): qty 1

## 2018-10-21 MED ORDER — SODIUM CHLORIDE 0.9% FLUSH
3.0000 mL | Freq: Two times a day (BID) | INTRAVENOUS | Status: DC
  Administered 2018-10-21 – 2018-10-24 (×6): 3 mL via INTRAVENOUS

## 2018-10-21 MED ORDER — ASPIRIN 81 MG PO CHEW
81.0000 mg | CHEWABLE_TABLET | Freq: Every day | ORAL | Status: DC
  Administered 2018-10-21 – 2018-10-24 (×4): 81 mg via ORAL
  Filled 2018-10-21 (×4): qty 1

## 2018-10-21 MED ORDER — SODIUM CHLORIDE 0.9 % IV SOLN
1000.0000 mL | INTRAVENOUS | Status: DC
  Administered 2018-10-21: 1000 mL via INTRAVENOUS

## 2018-10-21 MED ORDER — DULOXETINE HCL 60 MG PO CPEP
90.0000 mg | ORAL_CAPSULE | Freq: Every day | ORAL | Status: DC
  Administered 2018-10-21 – 2018-10-24 (×4): 90 mg via ORAL
  Filled 2018-10-21 (×4): qty 1

## 2018-10-21 MED ORDER — GABAPENTIN 100 MG PO CAPS
700.0000 mg | ORAL_CAPSULE | Freq: Three times a day (TID) | ORAL | Status: DC
  Administered 2018-10-21 – 2018-10-24 (×10): 700 mg via ORAL
  Filled 2018-10-21 (×10): qty 7

## 2018-10-21 MED ORDER — POTASSIUM CHLORIDE CRYS ER 20 MEQ PO TBCR
40.0000 meq | EXTENDED_RELEASE_TABLET | Freq: Two times a day (BID) | ORAL | Status: AC
  Administered 2018-10-21 – 2018-10-22 (×2): 40 meq via ORAL
  Filled 2018-10-21 (×2): qty 2

## 2018-10-21 MED ORDER — LIDOCAINE 4 % EX PTCH: 1.0000 | MEDICATED_PATCH | Freq: Every day | CUTANEOUS | Status: DC

## 2018-10-21 MED ORDER — CITALOPRAM HYDROBROMIDE 20 MG PO TABS
10.0000 mg | ORAL_TABLET | Freq: Every day | ORAL | Status: DC
  Administered 2018-10-21 – 2018-10-23 (×3): 10 mg via ORAL
  Filled 2018-10-21 (×3): qty 1

## 2018-10-21 MED ORDER — BUSPIRONE HCL 5 MG PO TABS
10.0000 mg | ORAL_TABLET | Freq: Three times a day (TID) | ORAL | Status: DC
  Administered 2018-10-21 – 2018-10-24 (×10): 10 mg via ORAL
  Filled 2018-10-21 (×4): qty 2
  Filled 2018-10-21: qty 1
  Filled 2018-10-21 (×4): qty 2

## 2018-10-21 MED ORDER — ALBUTEROL SULFATE (2.5 MG/3ML) 0.083% IN NEBU
2.5000 mg | INHALATION_SOLUTION | RESPIRATORY_TRACT | Status: DC | PRN
  Administered 2018-10-21 – 2018-10-22 (×2): 2.5 mg via RESPIRATORY_TRACT
  Filled 2018-10-21 (×2): qty 3

## 2018-10-21 MED ORDER — ACETAMINOPHEN 650 MG RE SUPP: 650.0000 mg | Freq: Four times a day (QID) | RECTAL | Status: DC | PRN

## 2018-10-21 MED ORDER — OXYCODONE-ACETAMINOPHEN 5-325 MG PO TABS
0.5000 | ORAL_TABLET | Freq: Every day | ORAL | Status: DC | PRN
  Administered 2018-10-21: 0.5 via ORAL
  Filled 2018-10-21: qty 1

## 2018-10-21 MED ORDER — BENZONATATE 100 MG PO CAPS
100.0000 mg | ORAL_CAPSULE | Freq: Three times a day (TID) | ORAL | Status: DC | PRN
  Administered 2018-10-21 – 2018-10-23 (×4): 100 mg via ORAL
  Filled 2018-10-21 (×4): qty 1

## 2018-10-21 MED ORDER — PANTOPRAZOLE SODIUM 40 MG PO TBEC
40.0000 mg | DELAYED_RELEASE_TABLET | Freq: Every day | ORAL | Status: DC
  Administered 2018-10-21 – 2018-10-24 (×4): 40 mg via ORAL
  Filled 2018-10-21 (×4): qty 1

## 2018-10-21 NOTE — ED Notes (Signed)
Pt requesting to take off BiPap; placed on 4 L nasal cannula. Pt states he feels much better, will continue to monitor.

## 2018-10-21 NOTE — ED Notes (Signed)
Patient was given 1000mg  APAP by EMS en route to ED.  Patient was hypotensive with EMS.

## 2018-10-21 NOTE — ED Notes (Addendum)
ED TO INPATIENT HANDOFF REPORT  ED Nurse Name and Phone #:  Alexia Freestone, RN [phone]  S Name/Age/Gender Raymond White 68 y.o. male Room/Bed: 056C/056C  Code Status   Code Status: DNR  Home/SNF/Other [facility]  AOx4 Is this baseline? Yes   Triage Complete: Triage complete  Chief Complaint sepsis  Triage Note Patient with chest pain for last two hours, feeling poorly for the last few days.  Patient with rales in lower lobes, clear on upper lobes.  Patient is CAOx4.   Allergies Allergies  Allergen Reactions  . Fish-Derived Products Anaphylaxis  . Sulfamethizole Rash    Level of Care/Admitting Diagnosis ED Disposition    ED Disposition Condition Comment   Admit  Hospital Area: [facility] [[id number]]  Level of Care: Progressive [102]  I expect the patient will be discharged within 24 hours: No (not a candidate for 5C-Observation unit)  Diagnosis: Acute respiratory failure with hypoxia [facility]) [[id number]]  Admitting Physician: Eduard Clos [phone]  Attending Physician: Eduard Clos [[id number]  PT Class (Do Not Modify): Observation [104]  PT Acc Code (Do Not Modify): Observation [10022]       B Medical/Surgery History Past Medical History:  Diagnosis Date  . Acute cystitis with hematuria   . Acute midline low back pain with sciatica   . Chronic heart failure with preserved ejection fraction (HCC)   . DDD (degenerative disc disease), lumbar   . Depressive disorder   . Drug induced constipation   . GERD (gastroesophageal reflux disease)   . History of TIA (transient ischemic attack)   . Hypertension   . Parkinson's disease (HCC) 2015  . Uncomplicated alcohol dependence (HCC)   . Urinary incontinence    Past Surgical History:  Procedure Laterality Date  . BACK SURGERY     x5     A IV Location/Drains/Wounds Patient Lines/Drains/Airways Status   Active Line/Drains/Airways    Name:   Placement date:   Placement time:   Site:   Days:    Peripheral IV 10/21/18 Left Hand   10/21/18    0413    Hand   less than 1   Peripheral IV 10/21/18 Left Antecubital   10/21/18    -    Antecubital   less than 1          Intake/Output Last 24 hours  Intake/Output Summary (Last 24 hours) at 10/21/2018 1752 Last data filed at 10/21/2018 9147 Gross per 24 hour  Intake 483.56 ml  Output -  Net 483.56 ml    Labs/Imaging Results for orders placed or performed during the hospital encounter of 10/21/18 (from the past 48 hour(s))  Influenza panel by PCR (type A & B)     Status: Abnormal   Collection Time: 10/21/18  4:30 AM  Result Value Ref Range   Influenza A By PCR POSITIVE (A) NEGATIVE   Influenza B By PCR NEGATIVE NEGATIVE    Comment: (NOTE) The Xpert Xpress Flu assay is intended as an aid in the diagnosis of  influenza and should not be used as a sole basis for treatment.  This  assay is FDA approved for nasopharyngeal swab specimens only. Nasal  washings and aspirates are unacceptable for Xpert Xpress Flu testing. Performed at [facility], [address], [place], [place] [address]   I-stat troponin, ED     Status: None   Collection Time: 10/21/18  4:30 AM  Result Value Ref Range   Troponin i, poc 0.01  0.00 - 0.08 ng/mL   Comment 3            Comment: Due to the release kinetics of cTnI, a negative result within the first hours of the onset of symptoms does not rule out myocardial infarction with certainty. If myocardial infarction is still suspected, repeat the test at appropriate intervals.   Comprehensive metabolic panel     Status: Abnormal   Collection Time: 10/21/18  5:19 AM  Result Value Ref Range   Sodium 137 135 - 145 mmol/L   Potassium 3.1 (L) 3.5 - 5.1 mmol/L   Chloride 103 98 - 111 mmol/L   CO2 28 22 - 32 mmol/L   Glucose, Bld 127 (H) 70 - 99 mg/dL   BUN 12 8 - 23 mg/dL   Creatinine, Ser 1.61 0.61 - 1.24 mg/dL   Calcium 8.3 (L) 8.9 - 10.3 mg/dL   Total Protein 5.5 (L) 6.5 - 8.1 g/dL   Albumin 3.1  (L) 3.5 - 5.0 g/dL   AST 19 15 - 41 U/L   ALT 18 0 - 44 U/L   Alkaline Phosphatase 81 38 - 126 U/L   Total Bilirubin 1.5 (H) 0.3 - 1.2 mg/dL   GFR calc non Af Amer >60 >60 mL/min   GFR calc Af Amer >60 >60 mL/min   Anion gap 6 5 - 15    Comment: Performed at [facility], [address], [place], [place] [address]  Lactic acid, plasma     Status: Abnormal   Collection Time: 10/21/18  5:19 AM  Result Value Ref Range   Lactic Acid, Venous 2.3 (HH) 0.5 - 1.9 mmol/L    Comment: CRITICAL RESULT CALLED TO, READ BACK BY AND VERIFIED WITH: PHILLIPS T,RN 10/21/18 5409 WAYK Performed at [facility], [address] [place], [place] [address]   CBC with Differential     Status: Abnormal   Collection Time: 10/21/18  5:19 AM  Result Value Ref Range   WBC 5.7 4.0 - 10.5 K/uL   RBC 3.77 (L) 4.22 - 5.81 MIL/uL   Hemoglobin 11.6 (L) 13.0 - 17.0 g/dL   HCT 47.8 (L) 29.5 - 62.1 %   MCV 96.6 80.0 - 100.0 fL   MCH 30.8 26.0 - 34.0 pg   MCHC 31.9 30.0 - 36.0 g/dL   RDW 30.8 65.7 - 84.6 %   Platelets 136 (L) 150 - 400 K/uL   nRBC 0.0 0.0 - 0.2 %   Neutrophils Relative % 79 %   Neutro Abs 4.5 1.7 - 7.7 K/uL   Lymphocytes Relative 7 %   Lymphs Abs 0.4 (L) 0.7 - 4.0 K/uL   Monocytes Relative 14 %   Monocytes Absolute 0.8 0.1 - 1.0 K/uL   Eosinophils Relative 0 %   Eosinophils Absolute 0.0 0.0 - 0.5 K/uL   Basophils Relative 0 %   Basophils Absolute 0.0 0.0 - 0.1 K/uL   Immature Granulocytes 0 %   Abs Immature Granulocytes 0.02 0.00 - 0.07 K/uL    Comment: Performed at [facility], [address] [place], [place] [address]  Protime-INR     Status: None   Collection Time: 10/21/18  5:19 AM  Result Value Ref Range   Prothrombin Time 14.2 11.4 - 15.2 seconds   INR 1.1 0.8 - 1.2    Comment: (NOTE) INR goal varies based on device and disease states. Performed at [facility], [address], [place], [place] [address]  Brain natriuretic peptide     Status: Abnormal    Collection Time: 10/21/18  5:19 AM  Result Value Ref Range   B Natriuretic Peptide 321.1 (H) 0.0 - 100.0 pg/mL    Comment: Performed at [facility], [address], [place], [place] [address]  Procalcitonin     Status: None   Collection Time: 10/21/18  5:19 AM  Result Value Ref Range   Procalcitonin <0.10 ng/mL    Comment:        Interpretation: PCT (Procalcitonin) <= 0.5 ng/mL: Systemic infection (sepsis) is not likely. Local bacterial infection is possible. (NOTE)       Sepsis PCT Algorithm           Lower Respiratory Tract                                      Infection PCT Algorithm    ----------------------------     ----------------------------         PCT < 0.25 ng/mL                PCT < 0.10 ng/mL         Strongly encourage             Strongly discourage   discontinuation of antibiotics    initiation of antibiotics    ----------------------------     -----------------------------       PCT 0.25 - 0.50 ng/mL            PCT 0.10 - 0.25 ng/mL               OR       >80% decrease in PCT            Discourage initiation of                                            antibiotics      Encourage discontinuation           of antibiotics    ----------------------------     -----------------------------         PCT >= 0.50 ng/mL              PCT 0.26 - 0.50 ng/mL               AND        <80% decrease in PCT             Encourage initiation of                                             antibiotics       Encourage continuation           of antibiotics    ----------------------------     -----------------------------        PCT >= 0.50 ng/mL                  PCT > 0.50 ng/mL               AND         increase in PCT  Strongly encourage                                      initiation of antibiotics    Strongly encourage escalation           of antibiotics                                     -----------------------------                                            PCT <= 0.25 ng/mL                                                 OR                                        > 80% decrease in PCT                                     Discontinue / Do not initiate                                             antibiotics Performed at [facility], [address], [place], [place] [address]   Lactic acid, plasma     Status: None   Collection Time: 10/21/18  9:01 AM  Result Value Ref Range   Lactic Acid, Venous 1.0 0.5 - 1.9 mmol/L    Comment: Performed at [facility], [address] [place], [place] [address]  Lactic acid, plasma     Status: None   Collection Time: 10/21/18 10:48 AM  Result Value Ref Range   Lactic Acid, Venous 1.2 0.5 - 1.9 mmol/L    Comment: Performed at [facility], [address] [place], [place] [address]  Troponin I - Now Then Q6H     Status: None   Collection Time: 10/21/18 10:48 AM  Result Value Ref Range   Troponin I <0.03 <0.03 ng/mL    Comment: Performed at [facility] Lab, [address] [place], [place] [address]   Dg Chest 2 View  Result Date: 10/21/2018 CLINICAL DATA:  Shortness of breath and suspected sepsis EXAM: CHEST - 2 VIEW COMPARISON:  06/27/2018 FINDINGS: Low volume chest with chronic asymmetric elevation of the right diaphragm. Question prior diaphragmatic rupture and repair based on CT 11/22/2017. There is overlying right lower lobe scarring costophrenic sulcus blunting. Remote right rib fractures. Normal heart size. There is no edema, consolidation, effusion, or pneumothorax. IMPRESSION: 1. No evidence of acute disease. 2. Chronic right base scarring over the elevated diaphragm. Electronically Signed   By: Marnee Spring M.D.   On: 10/21/2018 05:07    Pending Labs Unresulted Labs (From admission, onward)  Start     Ordered   10/22/18 0500  Basic metabolic panel  Tomorrow morning,   R     10/21/18 0910   10/22/18 0500  CBC  Tomorrow morning,   R     10/21/18 0910   10/21/18  1030  Troponin I - Now Then Q6H  Now then every 6 hours,   R     10/21/18 0910   10/21/18 0907  HIV antibody (Routine Testing)  Once,   R     10/21/18 0910   10/21/18 0530  Urinalysis, Routine w reflex microscopic  Once,   R     10/21/18 0530   10/21/18 0417  Culture, blood (Routine x 2)  BLOOD CULTURE X 2,   STAT     10/21/18 0418          Vitals/Pain Today's Vitals   10/21/18 1524 10/21/18 1530 10/21/18 1610 10/21/18 1746  BP:  122/82    Pulse:  83    Resp:      Temp:      TempSrc:      SpO2:  94%    Weight:      Height:      PainSc: 0-No pain  0-No pain 7     Isolation Precautions Droplet precaution  Medications Medications  oseltamivir (TAMIFLU) capsule 75 mg (75 mg Oral Given 10/21/18 0633)  vancomycin (VANCOCIN) IVPB 1000 mg/200 mL premix (has no administration in time range)  ceFEPIme (MAXIPIME) 2 g in sodium chloride 0.9 % 100 mL IVPB (has no administration in time range)  ondansetron (ZOFRAN) injection 4 mg (4 mg Intravenous Given 10/21/18 1239)  aspirin chewable tablet 81 mg (81 mg Oral Given 10/21/18 1040)  oxyCODONE-acetaminophen (PERCOCET/ROXICET) 5-325 MG per tablet 0.5 tablet (0.5 tablets Oral Given 10/21/18 1750)  atorvastatin (LIPITOR) tablet 80 mg (80 mg Oral Given 10/21/18 1744)  carvedilol (COREG) tablet 6.25 mg (6.25 mg Oral Given 10/21/18 1745)  0.9 %  sodium chloride infusion (1,000 mLs Intravenous New Bag/Given 10/21/18 0932)  busPIRone (BUSPAR) tablet 10 mg (10 mg Oral Given 10/21/18 1609)  citalopram (CELEXA) tablet 10 mg (has no administration in time range)  DULoxetine (CYMBALTA) DR capsule 90 mg (90 mg Oral Given 10/21/18 1045)  docusate sodium (COLACE) capsule 100 mg (100 mg Oral Refused 10/21/18 1320)  pantoprazole (PROTONIX) EC tablet 40 mg (40 mg Oral Given 10/21/18 1038)  polyethylene glycol (MIRALAX / GLYCOLAX) packet 17 g (17 g Oral Refused 10/21/18 1320)  tamsulosin (FLOMAX) capsule 0.4 mg (has no administration in time range)  clopidogrel (PLAVIX) tablet  75 mg (75 mg Oral Given 10/21/18 1038)  carbidopa-levodopa (SINEMET IR) 25-100 MG per tablet immediate release 2 tablet (2 tablets Oral Given 10/21/18 1610)  gabapentin (NEURONTIN) capsule 700 mg (700 mg Oral Given 10/21/18 1608)  thiamine (VITAMIN B-1) tablet 100 mg (100 mg Oral Given 10/21/18 1039)  benzonatate (TESSALON) capsule 100 mg (100 mg Oral Given 10/21/18 1240)  ipratropium-albuterol (DUONEB) 0.5-2.5 (3) MG/3ML nebulizer solution 3 mL (3 mLs Nebulization Given 10/21/18 1446)  albuterol (PROVENTIL) (2.5 MG/3ML) 0.083% nebulizer solution 2.5 mg (2.5 mg Nebulization Given 10/21/18 1245)  enoxaparin (LOVENOX) injection 40 mg (40 mg Subcutaneous Given 10/21/18 1045)  sodium chloride flush (NS) 0.9 % injection 3 mL (3 mLs Intravenous Given 10/21/18 1246)  acetaminophen (TYLENOL) tablet 650 mg (650 mg Oral Given 10/21/18 1318)    Or  acetaminophen (TYLENOL) suppository 650 mg ( Rectal See Alternative 10/21/18 1318)  morphine 2 MG/ML injection 2 mg (  has no administration in time range)  lidocaine (LIDODERM) 5 % 1 patch (1 patch Transdermal Patch Applied 10/21/18 1243)  sodium chloride flush (NS) 0.9 % injection 3 mL (3 mLs Intravenous Given 10/21/18 0434)  ceFEPIme (MAXIPIME) 2 g in sodium chloride 0.9 % 100 mL IVPB (0 g Intravenous Stopped 10/21/18 0626)  vancomycin (VANCOCIN) 2,000 mg in sodium chloride 0.9 % 500 mL IVPB (0 mg Intravenous Stopped 10/21/18 0843)    Mobility walks with device Low fall risk   Focused Assessments None noted  R Recommendations: See Admitting Provider Note  Report given to:   Additional Notes:

## 2018-10-21 NOTE — Progress Notes (Signed)
Pharmacy Antibiotic Note  NIZAIAH SCHOEPP is a 68 y.o. male admitted on 10/21/2018 with sepsis.  Pharmacy has been consulted for Vancomycin/Cefepime dosing. WBC WNL. Renal function good. Pt febrile up to 103.6.   Plan: Vancomycin 2000 mg IV x 1, then give 1000 mg IV q12h >>Estimated AUC: 475 Cefepime 2g IV q12h Trend WBC, temp, renal function  F/U infectious work-up Drug levels as indicated   Height: 6\' 1"  (185.4 cm) Weight: 235 lb (106.6 kg) IBW/kg (Calculated) : 79.9  Temp (24hrs), Avg:102 F (38.9 C), Min:100.4 F (38 C), Max:103.6 F (39.8 C)  Recent Labs  Lab 10/21/18 0519  WBC 5.7  CREATININE 0.89  LATICACIDVEN 2.3*    Estimated Creatinine Clearance: 101.8 mL/min (by C-G formula based on SCr of 0.89 mg/dL).    Allergies  Allergen Reactions  . Fish-Derived Products Anaphylaxis  . Sulfamethizole Rash   Abran Duke 10/21/2018 6:33 AM

## 2018-10-21 NOTE — ED Notes (Signed)
Attempted report 

## 2018-10-21 NOTE — ED Triage Notes (Signed)
Patient with chest pain for last two hours, feeling poorly for the last few days.  Patient with rales in lower lobes, clear on upper lobes.  Patient is CAOx4.

## 2018-10-21 NOTE — ED Notes (Signed)
Pt called out with worsening SOB, states "I feel 100% worse than when I got here". Patient on 5L and satting in the low 80s, MD paged, and while bedside verbally ordered Bipap. Respiratory aware and pt to be put on Bipap now.

## 2018-10-21 NOTE — ED Notes (Signed)
Pt ambulatory to bathroom to have BM, accompanied by staff. Pt short of breath upon return to bed. Breathing tx administered, pt states SOB is better now.

## 2018-10-21 NOTE — ED Notes (Signed)
Patient transported to X-ray 

## 2018-10-21 NOTE — Progress Notes (Signed)
Pt taken off Bipap by RN due to nausea. Pt on 6L [place] tolerating well, no distress noted at this time

## 2018-10-21 NOTE — H&P (Addendum)
History and Physical    Raymond White ZOX:[id number] DOB: 1951-08-15 DOA: 10/21/2018  PCP: [facility] Consultants:  None Patient coming from:  [facility] SN[facility] since September due to back injury; NOK: Anna Genre, [phone]  Chief Complaint: SOB, fever  HPI: Raymond White is a 68 y.o. male with medical history significant of ETOH dependence; Parkinson's; HTN; and chronic diastolic CHF presenting with SOB, fever.  He started having chest pain.  He is still having chest pain but now his breathing is short.  He was a little SOB while at home.  Since arriving in the ER, his SOB has markedly worsened.  Not on home O2.  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 Doesn't know if he had fever at home, but currently with shaking chills.  Symptoms started Saturday.   His roommate had PNA and has similar symptoms.   ED Course:  Carryover, as per Dr. Toniann Fail:  68 year old male with history of Parkinson's disease was brought in from nursing home after patient was found to have shortness of breath chest pain and fever. In the ER patient is requiring 4 L oxygen. Turned out to have flu positive. Admitted for acute respiratory failure likely from flu.  Review of Systems: As per HPI; otherwise review of systems reviewed and negative.   Ambulatory Status:  Ambulates with a cane or walker; he uses a wheelchair for long distances  Past Medical History:  Diagnosis Date  . Acute cystitis with hematuria   . Acute midline low back pain with sciatica   . Chronic heart failure with preserved ejection fraction (HCC)   . DDD (degenerative disc disease), lumbar   . Depressive disorder   . Drug induced constipation   . GERD (gastroesophageal reflux disease)   . History of TIA (transient ischemic attack)   . Hypertension   . Parkinson's disease (HCC) 2015  . Uncomplicated alcohol dependence (HCC)   .  Urinary incontinence     Past Surgical History:  Procedure Laterality Date  . BACK SURGERY     x5    Social History   Socioeconomic History  . Marital status: Widowed    Spouse name: Not on file  . Number of children: Not on file  . Years of education: Not on file  . Highest education level: Not on file  Occupational History  . Not on file  Social Needs  . Financial resource strain: Not on file  . Food insecurity:    Worry: Not on file    Inability: Not on file  . Transportation needs:    Medical: Not on file    Non-medical: Not on file  Tobacco Use  . Smoking status: Never Smoker  . Smokeless tobacco: Never Used  Substance and Sexual Activity  . Alcohol use: Not Currently    Comment: Vodka  . Drug use: Never  . Sexual activity: Not on file  Lifestyle  . Physical activity:    Days per week: Not on file    Minutes per session: Not on file  . Stress: Not on file  Relationships  . Social connections:    Talks on phone: Not on file    Gets together: Not on file    Attends religious service: Not on file    Active member of club or organization: Not on file    Attends meetings of clubs or organizations: Not on file  Relationship status: Not on file  . Intimate partner violence:    Fear of current or ex partner: Not on file    Emotionally abused: Not on file    Physically abused: Not on file    Forced sexual activity: Not on file  Other Topics Concern  . Not on file  Social History Narrative   Widowed.  Never smoker.  Has history of alcohol abuse with vodka being beverage of choice.    Allergies  Allergen Reactions  . Fish-Derived Products Anaphylaxis  . Sulfamethizole Rash    Family History  Problem Relation Age of Onset  . Hyperlipidemia Mother   . Cancer Father     Prior to Admission medications   Medication Sig Start Date End Date Taking? Authorizing Provider  acetaminophen (TYLENOL) 500 MG tablet Take 1,000 mg by mouth every 6 (six) hours as  needed for fever.   Yes [provider]  albuterol (PROVENTIL HFA;VENTOLIN HFA) 108 (90 Base) MCG/ACT inhaler Inhale 2 puffs into the lungs every 6 (six) hours as needed for wheezing or shortness of breath. Wait 1 minute in between each puff   Yes [provider]  aspirin 81 MG chewable tablet Chew 81 mg by mouth daily.   Yes [provider]  atorvastatin (LIPITOR) 80 MG tablet Take 80 mg by mouth every evening.   Yes [provider]  benzonatate (TESSALON) 100 MG capsule Take 100 mg by mouth 3 (three) times daily as needed for cough.   Yes [provider]  busPIRone (BUSPAR) 10 MG tablet Take 10 mg by mouth 3 (three) times daily.   Yes [provider]  carbidopa-levodopa (SINEMET IR) 25-100 MG tablet Take 2 tablets by mouth 3 (three) times daily.    Yes [provider]  carvedilol (COREG) 3.125 MG tablet Take 6.25 mg by mouth 2 (two) times daily with a meal.   Yes [provider]  citalopram (CELEXA) 10 MG tablet Take 10 mg by mouth at bedtime.   Yes [provider]  clopidogrel (PLAVIX) 75 MG tablet Take 75 mg by mouth daily.   Yes [provider]  docusate sodium (COLACE) 100 MG capsule Take 1 capsule (100 mg total) by mouth every 12 (twelve) hours. 06/27/18  Yes Caccavale, Sophia, PA-C  DULoxetine (CYMBALTA) 30 MG capsule Take 90 mg by mouth daily.    Yes [provider]  EPINEPHrine 0.3 mg/0.3 mL IJ SOAJ injection Inject 0.3 mg into the muscle daily as needed for anaphylaxis.   Yes [provider]  furosemide (LASIX) 20 MG tablet Take 20 mg by mouth daily.    Yes [provider]  gabapentin (NEURONTIN) 600 MG tablet Take 700 mg by mouth 3 (three) times daily. Take along with a 100 mg capsule   Yes [provider]  ipratropium-albuterol (DUONEB) 0.5-2.5 (3) MG/3ML SOLN Take 3 mLs by nebulization every 6 (six) hours as needed (wheezing, SOB).   Yes [provider]    Lidocaine (LIDO KING) 4 % PTCH Apply 1 patch topically daily.   Yes [provider]  meloxicam (MOBIC) 7.5 MG tablet Take 7.5 mg by mouth daily.   Yes [provider]  omeprazole (PRILOSEC) 20 MG capsule Take 20 mg by mouth 2 (two) times daily before a meal.   Yes [provider]  oxyCODONE-acetaminophen (PERCOCET/ROXICET) 5-325 MG tablet Take 0.5 tablets by mouth daily as needed for severe pain.   Yes [provider]  polyethylene glycol (MIRALAX / GLYCOLAX)  packet Take 17 g by mouth daily. 06/27/18  Yes Caccavale, Sophia, PA-C  potassium chloride SA (K-DUR,KLOR-CON) 20 MEQ tablet Take 20 mEq by mouth daily. With food   Yes [provider]  tamsulosin (FLOMAX) 0.4 MG CAPS capsule Take 0.4 mg by mouth at bedtime.   Yes [provider]  thiamine (VITAMIN B-1) 100 MG tablet Take 100 mg by mouth daily.   Yes [provider]    Physical Exam: Vitals:   10/21/18 1400 10/21/18 1430 10/21/18 1500 10/21/18 1530  BP: 136/75 121/79 137/84 122/82  Pulse: 93 88 93 83  Resp:      Temp:      TempSrc:      SpO2: 95% 96% (!) 88% 94%  Weight:      Height:         . General:  Initially quite ill-appearing and uncomfortable, but he was placed on BIPAP for about an hour and felt markedly improved with improved sats on 4L [place] O2 . Eyes:  PERRL, EOMI, normal lids, iris . ENT:  grossly normal hearing, lips & tongue, dry mm . Neck:  no LAD, masses or thyromegaly . Cardiovascular:  RRR, no m/r/g. No LE edema.  Marland Kitchen Respiratory:  Diffuse rhonchi without apparent consolidation . Abdomen:  soft, NT, ND, NABS . Back:   normal alignment, no CVAT . Skin:  no rash or induration seen on limited exam . Musculoskeletal:  grossly normal tone BUE/BLE, good ROM, no bony abnormality . Psychiatric:  grossly normal mood and affect, speech fluent and appropriate, AOx3 . Neurologic:  CN 2-12 grossly intact, moves all extremities in coordinated fashion, sensation  intact    Radiological Exams on Admission: Dg Chest 2 View  Result Date: 10/21/2018 CLINICAL DATA:  Shortness of breath and suspected sepsis EXAM: CHEST - 2 VIEW COMPARISON:  06/27/2018 FINDINGS: Low volume chest with chronic asymmetric elevation of the right diaphragm. Question prior diaphragmatic rupture and repair based on CT 11/22/2017. There is overlying right lower lobe scarring costophrenic sulcus blunting. Remote right rib fractures. Normal heart size. There is no edema, consolidation, effusion, or pneumothorax. IMPRESSION: 1. No evidence of acute disease. 2. Chronic right base scarring over the elevated diaphragm. Electronically Signed   By: Marnee Spring M.D.   On: 10/21/2018 05:07    EKG: Independently reviewed.  Sinus tachcyardia with rate 106; nonspecific ST changes with no evidence of acute ischemia   Labs on Admission: I have personally reviewed the available labs and imaging studies at the time of the admission.  Pertinent labs:   K+ 3.1 Glucose 127 Albumin 3.1 Bili 1.5 BNP 321.1 Troponin 0.01 Lactate 2.3 WBC 5.7 Hgb 11.6 Platelets 136 INR 1.1 Influenza A positive  Assessment/Plan Principal Problem:   Acute respiratory failure with hypoxia (HCC) Active Problems:   Chronic heart failure with preserved ejection fraction (HCC)   Hypertension   Influenza A   Parkinson disease (HCC)   Acute respiratory failure with hypoxia associated with Influenza A -Patient presented with hypoxia to 86% on room air -He was found to have influenza -We did a trial of BIPAP for a short period of time with marked improvement and he has continued to feel better since -Supportive care -Tamiflu BID -IVF at 100 cc/hr -Observation on telemetry (initially placed in a progressive bed, but will downgrade to medical telemetry given ongoing improvement) -Anticipate resolution of tachycardia and improvement in symptoms as flu improves  Chronic diastolic CHF -11/23/17 echo at [facility] with  preserved EF and  no comment about diastolic function -He is compensated at this time but will need to be monitored given his IV rehydration in the setting of acute infection -Continue Lipitor, Plavix, Coreg  HTN -Continue Coreg  Parkinson's -Continue Sinemet as well as Buspar, Celexa, Cymbalta      DVT prophylaxis:  Lovenox  Code Status: DNR - confirmed with patient Family Communication: None present; I called and spoke with his caretaker Disposition Plan:  Back to SNF once clinically improved Consults called: None Admission status: It is my clinical opinion that referral for OBSERVATION is reasonable and necessary in this patient based on the above information provided. The aforementioned taken together are felt to place the patient at high risk for further clinical deterioration. However it is anticipated that the patient may be medically stable for discharge from the hospital within 24 to 48 hours.    Jonah Blue MD [facility]   How to contact the [facility] Attending or Consulting provider 7A - 7P or covering provider during after hours 7P -7A, for this patient?  1. Check the care team in [facility] and look for a) attending/consulting [facility] provider listed and b) the [facility] team listed 2. Log into [email or web address] and use [place]'s universal password to access. If you do not have the password, please contact the hospital operator. 3. Locate the [facility] provider you are looking for under [facility] and page to a number that you can be directly reached. 4. If you still have difficulty reaching the provider, please page the [facility] (Director on Call) for the Hospitalists listed on amion for assistance.   10/21/2018, 6:42 PM

## 2018-10-21 NOTE — ED Provider Notes (Signed)
TIME SEEN: 4:30 AM  CHIEF COMPLAINT: Fever, chest pain and shortness of breath  HPI: Patient is a 68 year old male with history of hypertension, CHF, Parkinson's, TIA who presents to the emergency department with EMS from [facility] with complaints of not feeling well over the past few days.  Found to have fever of 103 with EMS and given 1000 mg of Tylenol.  Also reports tonight he started having left-sided chest pressure and shortness of breath.  This is slowly improving.  No nausea, vomiting or diarrhea.  Did have a flu shot this year.  States his roommate have pneumonia.  No other sick contacts that he is aware of.  No recent travel.  He states he has had aspiration pneumonia multiple times in the past because of his history of Parkinson's.  Does not feel like he has aspirated recently.  EMS reports oxygen saturation 89% on room air.  Does not wear oxygen chronically.  EMS reports initial systolic blood pressure in the 90s.  This has improved with IV hydration.  ROS: See HPI Constitutional:  fever  Eyes: no drainage  ENT: no runny nose   Cardiovascular:  chest pain  Resp: SOB  GI: no vomiting GU: no dysuria Integumentary: no rash  Allergy: no hives  Musculoskeletal: no leg swelling  Neurological: no slurred speech ROS otherwise negative  PAST MEDICAL HISTORY/PAST SURGICAL HISTORY:  Past Medical History:  Diagnosis Date  . Acute cystitis with hematuria   . Acute midline low back pain with sciatica   . CHF (congestive heart failure) (HCC)   . Chronic heart failure with preserved ejection fraction (HCC)   . DDD (degenerative disc disease), lumbar   . Depressive disorder   . Drug induced constipation   . GERD (gastroesophageal reflux disease)   . History of TIA (transient ischemic attack)   . Hypertension   . Movement disorder   . Uncomplicated alcohol dependence (HCC)   . Urinary incontinence     MEDICATIONS:  Prior to Admission medications   Medication Sig  Start Date End Date Taking? Authorizing Provider  albuterol (PROVENTIL HFA;VENTOLIN HFA) 108 (90 Base) MCG/ACT inhaler Inhale 2 puffs into the lungs every 6 (six) hours as needed for wheezing or shortness of breath. Wait 1 minute in between each puff    [provider]  aspirin 81 MG chewable tablet Chew 81 mg by mouth daily.    [provider]  atorvastatin (LIPITOR) 80 MG tablet Take 80 mg by mouth every evening.    [provider]  bisacodyl (DULCOLAX) 10 MG suppository Place 10 mg rectally daily as needed for moderate constipation.    [provider]  busPIRone (BUSPAR) 10 MG tablet Take 10 mg by mouth 3 (three) times daily.    [provider]  carbidopa-levodopa (SINEMET IR) 25-100 MG tablet Take 2 tablets by mouth 3 (three) times daily.     [provider]  clopidogrel (PLAVIX) 75 MG tablet Take 75 mg by mouth daily.    [provider]  cyanocobalamin (,VITAMIN B-12,) 1000 MCG/ML injection Inject 1,000 mcg into the muscle every 30 (thirty) days.    [provider]  docusate sodium (COLACE) 100 MG capsule Take 1 capsule (100 mg total) by mouth every 12 (twelve) hours. 06/27/18   Caccavale, Sophia, PA-C  DULoxetine (CYMBALTA) 30 MG capsule Take 30 mg by mouth daily.    [provider]  furosemide (LASIX) 20 MG tablet Take 20 mg by mouth daily.  [provider]  gabapentin (NEURONTIN) 600 MG tablet Take 600-1,200 mg by mouth 2 (two) times daily. Take one tablet (600mg ) every morning and take 2 tablets (1200mg ) every evening    [provider]  ipratropium-albuterol (DUONEB) 0.5-2.5 (3) MG/3ML SOLN Take 3 mLs by nebulization every 6 (six) hours as needed (wheezing, SOB).    [provider]  oxyCODONE (OXY IR/ROXICODONE) 5 MG immediate release tablet Take 5 mg by mouth every 6 (six) hours as needed for breakthrough pain.    [provider]  pantoprazole (PROTONIX) 40 MG tablet Take  40 mg by mouth daily.    [provider]  polyethylene glycol (MIRALAX / GLYCOLAX) packet Take 17 g by mouth daily. 06/27/18   Caccavale, Sophia, PA-C  potassium chloride SA (K-DUR,KLOR-CON) 20 MEQ tablet Take 20 mEq by mouth daily. With food    [provider]  QUEtiapine (SEROQUEL) 50 MG tablet Take 50 mg by mouth at bedtime.    [provider]  senna-docusate (SENOKOT-S) 8.6-50 MG tablet Take 1 tablet by mouth daily as needed for mild constipation.    [provider]  tamsulosin (FLOMAX) 0.4 MG CAPS capsule Take 0.4 mg by mouth at bedtime.    [provider]  tiZANidine (ZANAFLEX) 4 MG tablet Take 4 mg by mouth 3 (three) times daily as needed for muscle spasms.    [provider]  trolamine salicylate (ASPERCREME) 10 % cream Apply 1 application topically 2 (two) times daily as needed for muscle pain.    [provider]    ALLERGIES:  Allergies  Allergen Reactions  . Fish-Derived Products Anaphylaxis  . Sulfamethizole Rash    SOCIAL HISTORY:  Social History   Tobacco Use  . Smoking status: Never Smoker  . Smokeless tobacco: Never Used  Substance Use Topics  . Alcohol use: Not Currently    Comment: Vodka    FAMILY HISTORY: Family History  Problem Relation Age of Onset  . Hyperlipidemia Mother   . Cancer Father     EXAM: BP 129/76 (BP Location: Right Arm)   Pulse (!) 104   Temp (!) 103.6 F (39.8 C) (Rectal)   Resp (!) 26   Ht 6\' 1"  (1.854 m)   Wt 106.6 kg   SpO2 95%   BMI 31.00 kg/m  CONSTITUTIONAL: Alert and oriented and responds appropriately to questions. Well-appearing; well-nourished, elderly, in no distress HEAD: Normocephalic EYES: Conjunctivae clear, pupils appear equal, EOMI ENT: normal nose; moist mucous membranes NECK: Supple, no meningismus, no nuchal rigidity, no LAD  CARD: Regular and tachycardic; S1 and S2 appreciated; no murmurs, no clicks, no rubs, no gallops RESP: Normal chest  excursion without splinting, patient is tachypneic, breath sounds equal and clear bilaterally without wheezing, rhonchi or rales, hypoxic on room air at rest, no respiratory distress ABD/GI: Normal bowel sounds; non-distended; soft, non-tender, no rebound, no guarding, no peritoneal signs, no hepatosplenomegaly BACK:  The back appears normal and is non-tender to palpation, there is no CVA tenderness EXT: Normal ROM in all joints; non-tender to palpation; no edema; normal capillary refill; no cyanosis, no calf tenderness or swelling    SKIN: Normal color for age and race; warm; no rash NEURO: Moves all extremities equally PSYCH: The patient's mood and manner are appropriate. Grooming and personal hygiene are appropriate.  MEDICAL DECISION MAKING: Patient here with flulike symptoms.  Also complaining of chest pain.  EKG shows no ischemic abnormality.  He is tachycardic but this is likely from his fever.  Received antipyretics with EMS.  Code sepsis initiated.  Nursing staff report patient initially hypotensive with EMS with systolic blood pressures in the 90s and hypoxic on room air to 89%.  This has improved with nasal cannula and gentle IV hydration.  Blood pressure currently in the 120 systolic.  Mentating normally.  Will obtain labs, urine, cultures, chest x-ray, flu swab.  Will give broad-spectrum antibiotics.  Anticipate admission.  Given blood pressure is improved, will hydrate gently given his history of CHF.  ED PROGRESS: Patient is positive for influenza type A.  Chest x-ray shows no pneumonia.  Will admit to medicine.  Patient currently on 4 L nasal cannula to keep his oxygen saturation in the low 90s.  6:19 AM Discussed patient's case with hospitalist, Dr. Rueben Bash.  I have recommended admission and patient (and family if present) agree with this plan. Admitting physician will place admission orders.   I reviewed all nursing notes, vitals, pertinent previous records, EKGs, lab and urine  results, imaging (as available).  Lactate mildly elevated at 2.3.  He is getting IV hydration.  Getting hydrated gently secondary to history of CHF.  No echocardiogram in our system.  He is not hypotensive here in the ED.    EKG Interpretation  Date/Time:  Tuesday October 21 2018 04:11:19 EST Ventricular Rate:  106 PR Interval:    QRS Duration: 94 QT Interval:  353 QTC Calculation: 469 R Axis:   64 Text Interpretation:  Sinus tachycardia Ventricular premature complex Low voltage, precordial leads No significant change since last tracing other than rate is faster Confirmed by , Baxter Hire [phone]) on 10/21/2018 4:32:40 AM        CRITICAL CARE Performed by: Baxter Hire    Total critical care time: 75 minutes  Critical care time was exclusive of separately billable procedures and treating other patients.  Critical care was necessary to treat or prevent imminent or life-threatening deterioration.  Critical care was time spent personally by me on the following activities: development of treatment plan with patient and/or surrogate as well as nursing, discussions with consultants, evaluation of patient's response to treatment, examination of patient, obtaining history from patient or surrogate, ordering and performing treatments and interventions, ordering and review of laboratory studies, ordering and review of radiographic studies, pulse oximetry and re-evaluation of patient's condition.      , Layla Maw, DO 10/21/18 [id number]

## 2018-10-21 NOTE — Progress Notes (Signed)
0800 called to pt's room by RN due to increased SOB & spo2 70's requiring 100% NRB.  On arrival to pt's room pt appears to be in distress on 100% NRB.  MD & RN present at bedside.  Pt states he feels he may vomit.  RN administered zofran. Waited X37minutes, pt now states he feels better.  Bipap placed on pt per MD order, tolerating well, RT will monitor

## 2018-10-21 NOTE — ED Notes (Addendum)
Labs redrawn due to [facility] error. Drew 2 gold tops as well as other labs.

## 2018-10-22 ENCOUNTER — Other Ambulatory Visit: Payer: Self-pay

## 2018-10-22 ENCOUNTER — Encounter (HOSPITAL_COMMUNITY): Payer: Self-pay | Admitting: General Practice

## 2018-10-22 DIAGNOSIS — J101 Influenza due to other identified influenza virus with other respiratory manifestations: Principal | ICD-10-CM

## 2018-10-22 DIAGNOSIS — Z8349 Family history of other endocrine, nutritional and metabolic diseases: Secondary | ICD-10-CM | POA: Diagnosis not present

## 2018-10-22 DIAGNOSIS — I110 Hypertensive heart disease with heart failure: Secondary | ICD-10-CM | POA: Diagnosis present

## 2018-10-22 DIAGNOSIS — Z882 Allergy status to sulfonamides status: Secondary | ICD-10-CM | POA: Diagnosis not present

## 2018-10-22 DIAGNOSIS — Z91013 Allergy to seafood: Secondary | ICD-10-CM | POA: Diagnosis not present

## 2018-10-22 DIAGNOSIS — Z7982 Long term (current) use of aspirin: Secondary | ICD-10-CM | POA: Diagnosis not present

## 2018-10-22 DIAGNOSIS — I5032 Chronic diastolic (congestive) heart failure: Secondary | ICD-10-CM

## 2018-10-22 DIAGNOSIS — N400 Enlarged prostate without lower urinary tract symptoms: Secondary | ICD-10-CM | POA: Diagnosis present

## 2018-10-22 DIAGNOSIS — M5136 Other intervertebral disc degeneration, lumbar region: Secondary | ICD-10-CM | POA: Diagnosis present

## 2018-10-22 DIAGNOSIS — Z66 Do not resuscitate: Secondary | ICD-10-CM | POA: Diagnosis present

## 2018-10-22 DIAGNOSIS — G20 Parkinson's disease: Secondary | ICD-10-CM | POA: Diagnosis present

## 2018-10-22 DIAGNOSIS — F329 Major depressive disorder, single episode, unspecified: Secondary | ICD-10-CM | POA: Diagnosis present

## 2018-10-22 DIAGNOSIS — Z8673 Personal history of transient ischemic attack (TIA), and cerebral infarction without residual deficits: Secondary | ICD-10-CM | POA: Diagnosis not present

## 2018-10-22 DIAGNOSIS — I10 Essential (primary) hypertension: Secondary | ICD-10-CM | POA: Diagnosis not present

## 2018-10-22 DIAGNOSIS — Z7902 Long term (current) use of antithrombotics/antiplatelets: Secondary | ICD-10-CM | POA: Diagnosis not present

## 2018-10-22 DIAGNOSIS — J9691 Respiratory failure, unspecified with hypoxia: Secondary | ICD-10-CM | POA: Diagnosis present

## 2018-10-22 DIAGNOSIS — Z809 Family history of malignant neoplasm, unspecified: Secondary | ICD-10-CM | POA: Diagnosis not present

## 2018-10-22 DIAGNOSIS — K219 Gastro-esophageal reflux disease without esophagitis: Secondary | ICD-10-CM | POA: Diagnosis present

## 2018-10-22 DIAGNOSIS — J9601 Acute respiratory failure with hypoxia: Secondary | ICD-10-CM | POA: Diagnosis present

## 2018-10-22 LAB — HIV ANTIBODY (ROUTINE TESTING W REFLEX): HIV Screen 4th Generation wRfx: NONREACTIVE

## 2018-10-22 LAB — CBC
HCT: 35.2 % — ABNORMAL LOW (ref 39.0–52.0)
Hemoglobin: 11.3 g/dL — ABNORMAL LOW (ref 13.0–17.0)
MCH: 30.9 pg (ref 26.0–34.0)
MCHC: 32.1 g/dL (ref 30.0–36.0)
MCV: 96.2 fL (ref 80.0–100.0)
Platelets: 114 10*3/uL — ABNORMAL LOW (ref 150–400)
RBC: 3.66 MIL/uL — ABNORMAL LOW (ref 4.22–5.81)
RDW: 14.2 % (ref 11.5–15.5)
WBC: 4.7 10*3/uL (ref 4.0–10.5)
nRBC: 0 % (ref 0.0–0.2)

## 2018-10-22 LAB — BASIC METABOLIC PANEL
Anion gap: 6 (ref 5–15)
BUN: 11 mg/dL (ref 8–23)
CHLORIDE: 104 mmol/L (ref 98–111)
CO2: 26 mmol/L (ref 22–32)
Calcium: 7.8 mg/dL — ABNORMAL LOW (ref 8.9–10.3)
Creatinine, Ser: 0.73 mg/dL (ref 0.61–1.24)
GFR calc Af Amer: >60 mL/min (ref >60)
GFR calc non Af Amer: >60 mL/min (ref >60)
Glucose, Bld: 130 mg/dL — ABNORMAL HIGH (ref 70–99)
Potassium: 3.5 mmol/L (ref 3.5–5.1)
Sodium: 136 mmol/L (ref 135–145)

## 2018-10-22 MED ORDER — FUROSEMIDE 40 MG PO TABS
40.0000 mg | ORAL_TABLET | Freq: Every day | ORAL | Status: DC
  Administered 2018-10-23 – 2018-10-24 (×2): 40 mg via ORAL
  Filled 2018-10-22 (×2): qty 1

## 2018-10-22 MED ORDER — HYDROCODONE-HOMATROPINE 5-1.5 MG/5ML PO SYRP
5.0000 mL | ORAL_SOLUTION | Freq: Four times a day (QID) | ORAL | Status: DC | PRN
  Administered 2018-10-22 – 2018-10-24 (×6): 5 mL via ORAL
  Filled 2018-10-22 (×6): qty 5

## 2018-10-22 MED ORDER — SODIUM CHLORIDE 0.9 % IV SOLN: 1000.0000 mL | INTRAVENOUS | Status: DC

## 2018-10-22 MED ORDER — FUROSEMIDE 10 MG/ML IJ SOLN
40.0000 mg | Freq: Once | INTRAMUSCULAR | Status: AC
  Administered 2018-10-22: 40 mg via INTRAVENOUS
  Filled 2018-10-22: qty 4

## 2018-10-22 MED ORDER — GUAIFENESIN ER 600 MG PO TB12
600.0000 mg | ORAL_TABLET | Freq: Two times a day (BID) | ORAL | Status: DC
  Administered 2018-10-22 – 2018-10-24 (×5): 600 mg via ORAL
  Filled 2018-10-22 (×5): qty 1

## 2018-10-22 NOTE — Plan of Care (Signed)
Discussed flu diagnonsis, meds, plan of care with pt. Pt verbalized understanding.

## 2018-10-22 NOTE — Progress Notes (Signed)
Flutter given to pt by RT. Pt states he has had one before and stated he knows how it works. PT at the bedside working with pt. RT notified pt to reach out to RT with any questions. RT will cont to follow

## 2018-10-22 NOTE — Evaluation (Signed)
Physical Therapy Evaluation Patient Details Name: Raymond White MRN: [id number] DOB: 12/22/1950 Today's Date: 10/22/2018   History of Present Illness  Raymond White is a 68 y.o. male with medical history significant of ETOH dependence; multiple back surgeries,  Parkinson's; HTN; and chronic diastolic CHF presenting with SOB, fever.  Work up  included influenza A.  Clinical Impression  Pt admitted with/for SOB/fever due to the flu.  Pt not far off from baseline function, but still needs the supplemental oxygen and min guard to min assist due to days of inactivity.  Pt currently limited functionally due to the problems listed below.  (see problems list.)  Pt will benefit from PT to maximize function and safety to be able to get back to the facility safely with available assist .     Follow Up Recommendations No PT follow up(back to facility.  pt to do his on rehab.)    Equipment Recommendations  None recommended by PT    Recommendations for Other Services       Precautions / Restrictions Precautions Precautions: Fall      Mobility  Bed Mobility               General bed mobility comments: NT, up in the recliner  Transfers Overall transfer level: Needs assistance   Transfers: Sit to/from Stand Sit to Stand: Supervision            Ambulation/Gait Ambulation/Gait assistance: Min guard Gait Distance (Feet): 180 Feet Assistive device: Rolling walker (2 wheeled) Gait Pattern/deviations: Step-through pattern Gait velocity: slower Gait velocity interpretation: <1.8 ft/sec, indicate of risk for recurrent falls General Gait Details: generally steady with the RW, shorter step lengths, no overt parkinsonian deviations.  Stairs            Wheelchair Mobility    Modified Rankin (Stroke Patients Only)       Balance Overall balance assessment: Needs assistance   Sitting balance-Leahy Scale: Good       Standing balance-Leahy Scale: Fair Standing  balance comment: prefers to have the control of an AD for gait                             Pertinent Vitals/Pain Pain Assessment: No/denies pain    Home Living Family/patient expects to be discharged to:: Skilled nursing facility                 Additional Comments: pt has been staying in the facility for lack of a good place to live at this time.    Prior Function Level of Independence: Needs assistance(in a supervised setting)   Gait / Transfers Assistance Needed: uses cane or RW in facility     Comments: When at home has a live in caregiver     Hand Dominance        Extremity/Trunk Assessment        Lower Extremity Assessment Lower Extremity Assessment: Overall WFL for tasks assessed       Communication   Communication: No difficulties  Cognition Arousal/Alertness: Awake/alert Behavior During Therapy: WFL for tasks assessed/performed Overall Cognitive Status: Within Functional Limits for tasks assessed                                        General Comments General comments (skin integrity, edema, etc.): Sats on 3L [place] during gait--96%  with EHR 92 bpm    Exercises     Assessment/Plan    PT Assessment Patient needs continued PT services  PT Problem List Decreased activity tolerance;Decreased mobility;Cardiopulmonary status limiting activity       PT Treatment Interventions Gait training;Functional mobility training;Therapeutic activities;DME instruction;Stair training    PT Goals (Current goals can be found in the Care Plan section)  Acute Rehab PT Goals Patient Stated Goal: get rid of the oxygen PT Goal Formulation: With patient Time For Goal Achievement: 10/29/18 Potential to Achieve Goals: Good    Frequency Min 2X/week   Barriers to discharge        Co-evaluation               AM-PAC PT "6 Clicks" Mobility  Outcome Measure Help needed turning from your back to your side while in a flat bed without  using bedrails?: A Little Help needed moving from lying on your back to sitting on the side of a flat bed without using bedrails?: A Little Help needed moving to and from a bed to a chair (including a wheelchair)?: A Little Help needed standing up from a chair using your arms (e.g., wheelchair or bedside chair)?: A Little Help needed to walk in hospital room?: A Little Help needed climbing 3-5 steps with a railing? : A Little 6 Click Score: 18    End of Session   Activity Tolerance: Patient tolerated treatment well Patient left: in chair;with call bell/phone within reach Nurse Communication: Mobility status PT Visit Diagnosis: Other abnormalities of gait and mobility (R26.89)    Time: 2353-6144 PT Time Calculation (min) (ACUTE ONLY): 25 min   Charges:   PT Evaluation $PT Eval Low Complexity: 1 Low PT Treatments $Gait Training: 8-22 mins        10/22/2018  [place], PT Acute Rehabilitation Services [phone]  (pager) [phone]  (office)  Eliseo Gum Carolle Ishii 10/22/2018, 6:20 PM

## 2018-10-22 NOTE — Progress Notes (Signed)
PROGRESS NOTE        PATIENT DETAILS Name: Raymond White Age: 68 y.o. Sex: male Date of Birth: 1951-04-06 Admit Date: 10/21/2018 Admitting Physician Eduard Clos, MD BNL:[id number], Lenn Sink  Brief Narrative: Patient is a 68 y.o. male with history of chronic diastolic heart failure, Parkinson's disease-presenting with several days history of fever, myalgias, shortness of breath-found to have acute hypoxic respiratory failure in the setting of influenza.   Subjective: Liberated off BiPAP last night-still on 4 L of oxygen via nasal cannula this morning.  Although better-not yet back to baseline.  Assessment/Plan: Acute hypoxic respiratory failure: Secondary to influenza A-no longer on BiPAP-but still short of breath and requiring 4 L of oxygen to maintain O2 saturation.  KVO all IV fluids-stop vancomycin and cefepime-continue Tamiflu, will give 1 dose of IV Lasix.  Cultures from 3/3 pending.  We will attempt to slowly titrate to room air over the next few days.  Influenza A: See above  Chronic diastolic heart failure: Remains compensated-no leg edema-has some bibasilar rales-not sure if this is from influenza or from CHF-we will give 1 dose of Lasix, and then resume oral diuretics.  Continue Coreg.    Parkinson's: Continue Sinemet  Depression: Appears stable-continue with Celexa, Cymbalta and BuSpar  BPH: Continue Flomax  DVT Prophylaxis: Prophylactic Lovenox   Code Status: Full code  Family Communication: None at bedside  Disposition Plan: Remain inpatient  Antimicrobial agents: Anti-infectives (From admission, onward)   Start     Dose/Rate Route Frequency Ordered Stop   10/21/18 2200  vancomycin (VANCOCIN) IVPB 1000 mg/200 mL premix     1,000 mg 200 mL/hr over 60 Minutes Intravenous Every 12 hours 10/21/18 0638     10/21/18 2200  ceFEPIme (MAXIPIME) 2 g in sodium chloride 0.9 % 100 mL IVPB     2 g 200 mL/hr over 30 Minutes  Intravenous Every 12 hours 10/21/18 0638     10/21/18 2200  oseltamivir (TAMIFLU) capsule 75 mg  Status:  Discontinued     75 mg Oral 2 times daily 10/21/18 0910 10/21/18 0913   10/21/18 0615  oseltamivir (TAMIFLU) capsule 75 mg     75 mg Oral 2 times daily 10/21/18 0606 10/26/18 0959   10/21/18 0445  ceFEPIme (MAXIPIME) 2 g in sodium chloride 0.9 % 100 mL IVPB     2 g 200 mL/hr over 30 Minutes Intravenous  Once 10/21/18 0437 10/21/18 0626   10/21/18 0445  vancomycin (VANCOCIN) IVPB 1000 mg/200 mL premix  Status:  Discontinued     1,000 mg 200 mL/hr over 60 Minutes Intravenous  Once 10/21/18 0437 10/21/18 0438   10/21/18 0445  vancomycin (VANCOCIN) 2,000 mg in sodium chloride 0.9 % 500 mL IVPB     2,000 mg 250 mL/hr over 120 Minutes Intravenous  Once 10/21/18 0438 10/21/18 0843      Procedures: None  CONSULTS:  None  Time spent: 25 minutes-Greater than 50% of this time was spent in counseling, explanation of diagnosis, planning of further management, and coordination of care.  MEDICATIONS: Scheduled Meds: . aspirin  81 mg Oral Daily  . atorvastatin  80 mg Oral QPM  . busPIRone  10 mg Oral TID  . carbidopa-levodopa  2 tablet Oral TID  . carvedilol  6.25 mg Oral BID WC  . citalopram  10 mg Oral QHS  .  clopidogrel  75 mg Oral Daily  . docusate sodium  100 mg Oral Q12H  . DULoxetine  90 mg Oral Daily  . enoxaparin (LOVENOX) injection  40 mg Subcutaneous Q24H  . gabapentin  700 mg Oral TID  . guaiFENesin  600 mg Oral BID  . ipratropium-albuterol  3 mL Nebulization Q6H  . lidocaine  1 patch Transdermal Q24H  . oseltamivir  75 mg Oral BID  . pantoprazole  40 mg Oral Daily  . polyethylene glycol  17 g Oral Daily  . sodium chloride flush  3 mL Intravenous Q12H  . tamsulosin  0.4 mg Oral QHS  . thiamine  100 mg Oral Daily   Continuous Infusions: . sodium chloride 10 mL/hr at 10/22/18 0809  . ceFEPime (MAXIPIME) IV 2 g (10/22/18 0914)  . vancomycin 1,000 mg (10/22/18 1019)    PRN Meds:.acetaminophen **OR** acetaminophen, albuterol, benzonatate, HYDROcodone-homatropine, morphine injection, ondansetron (ZOFRAN) IV, oxyCODONE-acetaminophen   PHYSICAL EXAM: Vital signs: Vitals:   10/22/18 0033 10/22/18 0258 10/22/18 0428 10/22/18 0811  BP:   103/62   Pulse:   78   Resp:   20   Temp:   99 F (37.2 C)   TempSrc:   Oral   SpO2: 93% 93% 95% 95%  Weight:      Height:       Filed Weights   10/21/18 0404  Weight: 106.6 kg   Body mass index is 31 kg/m.   General appearance :Awake, alert, not in any distress. HEENT: Atraumatic and Normocephalic Neck: supple, no JVD.  Resp:Good air entry bilaterally, rhonchi all over-some scattered bibasilar rales. CVS: S1 S2 regular, no murmurs.  GI: Bowel sounds present, Non tender and not distended with no gaurding, rigidity or rebound.No organomegaly Extremities: B/L Lower Ext shows no edema, both legs are warm to touch Neurology:  speech clear,Non focal, sensation is grossly intact. Musculoskeletal:No digital cyanosis Skin:No Rash, warm and dry Wounds:N/A  I have personally reviewed following labs and imaging studies  LABORATORY DATA: CBC: Recent Labs  Lab 10/21/18 0519 10/22/18 0415  WBC 5.7 4.7  NEUTROABS 4.5  --   HGB 11.6* 11.3*  HCT 36.4* 35.2*  MCV 96.6 96.2  PLT 136* 114*    Basic Metabolic Panel: Recent Labs  Lab 10/21/18 0519 10/22/18 0415  NA 137 136  K 3.1* 3.5  CL 103 104  CO2 28 26  GLUCOSE 127* 130*  BUN 12 11  CREATININE 0.89 0.73  CALCIUM 8.3* 7.8*    GFR: Estimated Creatinine Clearance: 113.3 mL/min (by C-G formula based on SCr of 0.73 mg/dL).  Liver Function Tests: Recent Labs  Lab 10/21/18 0519  AST 19  ALT 18  ALKPHOS 81  BILITOT 1.5*  PROT 5.5*  ALBUMIN 3.1*   No results for input(s): LIPASE, AMYLASE in the last 168 hours. No results for input(s): AMMONIA in the last 168 hours.  Coagulation Profile: Recent Labs  Lab 10/21/18 0519  INR 1.1    Cardiac  Enzymes: Recent Labs  Lab 10/21/18 1048 10/21/18 1811  TROPONINI <0.03 <0.03    BNP (last 3 results) No results for input(s): PROBNP in the last 8760 hours.  HbA1C: No results for input(s): HGBA1C in the last 72 hours.  CBG: No results for input(s): GLUCAP in the last 168 hours.  Lipid Profile: No results for input(s): CHOL, HDL, LDLCALC, TRIG, CHOLHDL, LDLDIRECT in the last 72 hours.  Thyroid Function Tests: No results for input(s): TSH, T4TOTAL, FREET4, T3FREE, THYROIDAB in the last 72  hours.  Anemia Panel: No results for input(s): VITAMINB12, FOLATE, FERRITIN, TIBC, IRON, RETICCTPCT in the last 72 hours.  Urine analysis:    Component Value Date/Time   COLORURINE YELLOW 06/30/2018 1148   APPEARANCEUR CLEAR 06/30/2018 1148   LABSPEC 1.020 06/30/2018 1148   PHURINE 6.0 06/30/2018 1148   GLUCOSEU NEGATIVE 06/30/2018 1148   HGBUR SMALL (A) 06/30/2018 1148   BILIRUBINUR NEGATIVE 06/30/2018 1148   KETONESUR 5 (A) 06/30/2018 1148   PROTEINUR NEGATIVE 06/30/2018 1148   NITRITE NEGATIVE 06/30/2018 1148   LEUKOCYTESUR SMALL (A) 06/30/2018 1148    Sepsis Labs: Lactic Acid, Venous    Component Value Date/Time   LATICACIDVEN 1.2 10/21/2018 1048    MICROBIOLOGY: No results found for this or any previous visit (from the past 240 hour(s)).  RADIOLOGY STUDIES/RESULTS: Dg Chest 2 View  Result Date: 10/21/2018 CLINICAL DATA:  Shortness of breath and suspected sepsis EXAM: CHEST - 2 VIEW COMPARISON:  06/27/2018 FINDINGS: Low volume chest with chronic asymmetric elevation of the right diaphragm. Question prior diaphragmatic rupture and repair based on CT 11/22/2017. There is overlying right lower lobe scarring costophrenic sulcus blunting. Remote right rib fractures. Normal heart size. There is no edema, consolidation, effusion, or pneumothorax. IMPRESSION: 1. No evidence of acute disease. 2. Chronic right base scarring over the elevated diaphragm. Electronically Signed   By:  Marnee Spring M.D.   On: 10/21/2018 05:07     LOS: 0 days   Jeoffrey Massed, MD  [facility]  If 7PM-7AM, please contact night-coverage  Please page via [email or web address]  Go to [email or web address] and use [place]'s universal password to access. If you do not have the password, please contact the hospital operator.  Locate the [facility] provider you are looking for under [facility] and page to a number that you can be directly reached. If you still have difficulty reaching the provider, please page the [facility] (Director on Call) for the Hospitalists listed on amion for assistance.  10/22/2018, 10:33 AM

## 2018-10-23 ENCOUNTER — Inpatient Hospital Stay (HOSPITAL_COMMUNITY): Payer: Medicare Other

## 2018-10-23 LAB — BASIC METABOLIC PANEL
Anion gap: 11 (ref 5–15)
BUN: 9 mg/dL (ref 8–23)
CO2: 25 mmol/L (ref 22–32)
Calcium: 8 mg/dL — ABNORMAL LOW (ref 8.9–10.3)
Chloride: 98 mmol/L (ref 98–111)
Creatinine, Ser: 0.7 mg/dL (ref 0.61–1.24)
GFR calc Af Amer: >60 mL/min (ref >60)
GFR calc non Af Amer: >60 mL/min (ref >60)
Glucose, Bld: 112 mg/dL — ABNORMAL HIGH (ref 70–99)
Potassium: 3.7 mmol/L (ref 3.5–5.1)
Sodium: 134 mmol/L — ABNORMAL LOW (ref 135–145)

## 2018-10-23 MED ORDER — FUROSEMIDE 10 MG/ML IJ SOLN
40.0000 mg | Freq: Once | INTRAMUSCULAR | Status: AC
  Administered 2018-10-23: 40 mg via INTRAVENOUS
  Filled 2018-10-23: qty 4

## 2018-10-23 MED ORDER — IPRATROPIUM-ALBUTEROL 0.5-2.5 (3) MG/3ML IN SOLN
3.0000 mL | Freq: Three times a day (TID) | RESPIRATORY_TRACT | Status: DC
  Administered 2018-10-24: 3 mL via RESPIRATORY_TRACT
  Filled 2018-10-23: qty 3

## 2018-10-23 MED ORDER — ALUM & MAG HYDROXIDE-SIMETH 200-200-20 MG/5ML PO SUSP: 30.0000 mL | Freq: Once | ORAL | Status: DC

## 2018-10-23 NOTE — Progress Notes (Signed)
Patient was complaining of epigastric pain this morning around 0530. He had 6/10 pain at rest and 8/10 pain with movement. He has severe rebound tenderness. I paged the on call provide and we are awaiting orders.

## 2018-10-23 NOTE — NC FL2 (Signed)
[place] MEDICAID FL2 LEVEL OF CARE SCREENING TOOL     IDENTIFICATION  Patient Name: Raymond White Birthdate: Feb 08, 1951 Sex: male Admission Date (Current Location): 10/21/2018  [facility] and [place] Number:  Producer, television/film/video and Address:  [facility] [place], [address] [place], [place] [address]      Provider Number: [id number]  Attending Physician Name and Address:  Maretta Bees, MD  Relative Name and Phone Number:       Current Level of Care: Hospital Recommended Level of Care: Skilled Nursing Facility Prior Approval Number:    Date Approved/Denied:   PASRR Number: [id number]  Discharge Plan: SNF    Current Diagnoses: Patient Active Problem List   Diagnosis Date Noted  . Respiratory failure with hypoxia (HCC) 10/22/2018  . Acute respiratory failure with hypoxia (HCC) 10/21/2018  . Influenza A 10/21/2018  . Parkinson disease (HCC) 10/21/2018  . DDD (degenerative disc disease), lumbar 12/15/2017  . Klebsiella cystitis 12/15/2017  . 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  . Chronic heart failure with preserved ejection fraction (HCC) 12/15/2017  . GERD (gastroesophageal reflux disease) 12/15/2017  . Hypertension 12/15/2017  . Depressive disorder 12/15/2017  . Anxiety 12/15/2017    Orientation RESPIRATION BLADDER Height & Weight     Self, Time, Situation, Place  O2(Nasal cannula 1L) Continent Weight: 108.5 kg Height:  6\' 1"  (185.4 cm)  BEHAVIORAL SYMPTOMS/MOOD NEUROLOGICAL BOWEL NUTRITION STATUS      Continent Diet(Please see DC Summary)  AMBULATORY STATUS COMMUNICATION OF NEEDS Skin   Supervision Verbally Normal                       Personal Care Assistance Level of Assistance  Bathing, Feeding, Dressing Bathing Assistance: Limited assistance Feeding assistance: Independent Dressing Assistance: Limited assistance     Functional Limitations Info  Sight, Hearing, Speech Sight Info: Adequate Hearing Info: Adequate Speech  Info: Adequate    SPECIAL CARE FACTORS FREQUENCY                       Contractures Contractures Info: Not present    Additional Factors Info  Code Status, Allergies, Psychotropic, Isolation Precautions Code Status Info: Full Allergies Info: Fish-derived Products, Sulfamethizole Psychotropic Info: Buspar; Cymbalta;   Isolation Precautions Info: Influenza-Tamiflu complete on 10/26/18     Current Medications (10/23/2018):  This is the current hospital active medication list Current Facility-Administered Medications  Medication Dose Route Frequency Provider Last Rate Last Dose  . 0.9 %  sodium chloride infusion  1,000 mL Intravenous Continuous Maretta Bees, MD 10 mL/hr at 10/22/18 0809    . acetaminophen (TYLENOL) tablet 650 mg  650 mg Oral Q6H PRN Jonah Blue, MD   650 mg at 10/22/18 0253   Or  . acetaminophen (TYLENOL) suppository 650 mg  650 mg Rectal Q6H PRN Jonah Blue, MD      . albuterol (PROVENTIL) (2.5 MG/3ML) 0.083% nebulizer solution 2.5 mg  2.5 mg Nebulization Q2H PRN Jonah Blue, MD   2.5 mg at 10/22/18 0033  . alum & mag hydroxide-simeth (MAALOX/MYLANTA) 200-200-20 MG/5ML suspension 30 mL  30 mL Oral Once Kirby-Graham, Beather Arbour, NP      . aspirin chewable tablet 81 mg  81 mg Oral Daily Jonah Blue, MD   81 mg at 10/23/18 3354  . atorvastatin (LIPITOR) tablet 80 mg  80 mg Oral QPM Jonah Blue, MD   80 mg at 10/23/18 1531  . benzonatate (TESSALON) capsule 100  mg  100 mg Oral TID PRN Jonah Blue, MD   100 mg at 10/23/18 0230  . busPIRone (BUSPAR) tablet 10 mg  10 mg Oral TID Jonah Blue, MD   10 mg at 10/23/18 1531  . carbidopa-levodopa (SINEMET IR) 25-100 MG per tablet immediate release 2 tablet  2 tablet Oral TID Jonah Blue, MD   2 tablet at 10/23/18 1530  . carvedilol (COREG) tablet 6.25 mg  6.25 mg Oral BID WC Jonah Blue, MD   6.25 mg at 10/23/18 1531  . citalopram (CELEXA) tablet 10 mg  10 mg Oral Noemi Chapel, MD   10  mg at 10/22/18 2150  . clopidogrel (PLAVIX) tablet 75 mg  75 mg Oral Daily Jonah Blue, MD   75 mg at 10/23/18 0825  . docusate sodium (COLACE) capsule 100 mg  100 mg Oral Q12H Jonah Blue, MD   100 mg at 10/23/18 0825  . DULoxetine (CYMBALTA) DR capsule 90 mg  90 mg Oral Daily Jonah Blue, MD   90 mg at 10/23/18 0827  . enoxaparin (LOVENOX) injection 40 mg  40 mg Subcutaneous Q24H Jonah Blue, MD   40 mg at 10/23/18 0973  . furosemide (LASIX) tablet 40 mg  40 mg Oral Daily Maretta Bees, MD   40 mg at 10/23/18 0829  . gabapentin (NEURONTIN) capsule 700 mg  700 mg Oral TID Jonah Blue, MD   700 mg at 10/23/18 1530  . guaiFENesin (MUCINEX) 12 hr tablet 600 mg  600 mg Oral BID Maretta Bees, MD   600 mg at 10/23/18 0827  . HYDROcodone-homatropine (HYCODAN) 5-1.5 MG/5ML syrup 5 mL  5 mL Oral Q6H PRN Leda Gauze, NP   5 mL at 10/23/18 1354  . ipratropium-albuterol (DUONEB) 0.5-2.5 (3) MG/3ML nebulizer solution 3 mL  3 mL Nebulization Q6H Jonah Blue, MD   3 mL at 10/23/18 1331  . lidocaine (LIDODERM) 5 % 1 patch  1 patch Transdermal Q24H Jonah Blue, MD   1 patch at 10/23/18 [phone]  . morphine 2 MG/ML injection 2 mg  2 mg Intravenous Q2H PRN Jonah Blue, MD   2 mg at 10/21/18 2032  . ondansetron (ZOFRAN) injection 4 mg  4 mg Intravenous Q6H PRN Jonah Blue, MD   4 mg at 10/21/18 1239  . oseltamivir (TAMIFLU) capsule 75 mg  75 mg Oral BID Jonah Blue, MD   75 mg at 10/23/18 9242  . oxyCODONE-acetaminophen (PERCOCET/ROXICET) 5-325 MG per tablet 0.5 tablet  0.5 tablet Oral Daily PRN Jonah Blue, MD   0.5 tablet at 10/21/18 1750  . pantoprazole (PROTONIX) EC tablet 40 mg  40 mg Oral Daily Jonah Blue, MD   40 mg at 10/23/18 0826  . polyethylene glycol (MIRALAX / GLYCOLAX) packet 17 g  17 g Oral Daily Jonah Blue, MD   17 g at 10/23/18 6834  . sodium chloride flush (NS) 0.9 % injection 3 mL  3 mL Intravenous Q12H Jonah Blue, MD   3 mL  at 10/23/18 0829  . tamsulosin (FLOMAX) capsule 0.4 mg  0.4 mg Oral QHS Jonah Blue, MD   0.4 mg at 10/22/18 2148  . thiamine (VITAMIN B-1) tablet 100 mg  100 mg Oral Daily Jonah Blue, MD   100 mg at 10/23/18 1962     Discharge Medications: Please see discharge summary for a list of discharge medications.  Relevant Imaging Results:  Relevant Lab Results:   Additional Information SSN: [id number] [place], [place]

## 2018-10-23 NOTE — Clinical Social Work Note (Signed)
Clinical Social Work Assessment  Patient Details  Name: Raymond White MRN: [id number] Date of Birth: 12/07/1950  Date of referral:  10/23/18               Reason for consult:  Facility Placement                Permission sought to share information with:  Facility Industrial/product designer granted to share information::  Yes, Verbal Permission Granted  Name::        Agency::  [facility]  Relationship::     Contact Information:     Housing/Transportation Living arrangements for the past 2 months:  Skilled Building surveyor of Information:  Patient Patient Interpreter Needed:  None Criminal Activity/Legal Involvement Pertinent to Current Situation/Hospitalization:  No - Comment as needed Significant Relationships:  Friend Lives with:  Facility Resident Do you feel safe going back to the place where you live?  Yes Need for family participation in patient care:  No (Coment)  Care giving concerns:  CSW received consult for possible SNF placement at time of discharge. CSW spoke with patient. He reports that he resides at [facility] and will return at discharge. CSW to continue to follow and assist with discharge planning needs.   Social Worker assessment / plan:  CSW spoke with patient concerning return to SNF.  Employment status:  Retired Health and safety inspector:  Medicare PT Recommendations:  No Follow Up Information / Referral to community resources:  Skilled Nursing Facility  Patient/Family's Response to care:  Patient reports agreement with discharge plan and will require PTAR for transportation. He expresses appreciation for the hospital's care.  Patient/Family's Understanding of and Emotional Response to Diagnosis, Current Treatment, and Prognosis:  Patient/family is realistic regarding therapy needs and expressed being hopeful for return to SNF placement. Patient expressed understanding of CSW role and discharge process as well as medical condition. No  questions/concerns about plan or treatment.    Emotional Assessment Appearance:  Appears stated age Attitude/Demeanor/Rapport:  Engaged Affect (typically observed):  Accepting, Appropriate Orientation:  Oriented to Self, Oriented to Place, Oriented to  Time, Oriented to Situation Alcohol / Substance use:  Not Applicable Psych involvement (Current and /or in the community):  No (Comment)  Discharge Needs  Concerns to be addressed:  Care Coordination Readmission within the last 30 days:  No Current discharge risk:  None Barriers to Discharge:  Continued Medical Work up   [employer], LCSW 10/23/2018, 4:45 PM

## 2018-10-23 NOTE — Progress Notes (Signed)
Physical Therapy Treatment Patient Details Name: Raymond White MRN: [id number] DOB: 05-05-1951 Today's Date: 10/23/2018    History of Present Illness Raymond White is a 68 y.o. male with medical history significant of ETOH dependence; multiple back surgeries,  Parkinson's; HTN; and chronic diastolic CHF presenting with SOB, fever.  Work up  included influenza A.    PT Comments    Pt showing improvements with lowering his supplemental oxygen needs.  Emphasis on gait stability and stamina.   Follow Up Recommendations  No PT follow up     Equipment Recommendations  None recommended by PT    Recommendations for Other Services       Precautions / Restrictions Precautions Precautions: Fall    Mobility  Bed Mobility Overal bed mobility: Modified Independent                Transfers Overall transfer level: Needs assistance   Transfers: Sit to/from Stand Sit to Stand: Supervision            Ambulation/Gait Ambulation/Gait assistance: Min guard Gait Distance (Feet): 200 Feet Assistive device: Rolling walker (2 wheeled) Gait Pattern/deviations: Step-through pattern Gait velocity: slower Gait velocity interpretation: <1.8 ft/sec, indicate of risk for recurrent falls General Gait Details: generally steady with the RW.  Minimal limitation from parkinsons.  SpO2 on 2L at 92-96% during gait with #HR at 92 bpm   Stairs             Wheelchair Mobility    Modified Rankin (Stroke Patients Only)       Balance     Sitting balance-Leahy Scale: Good       Standing balance-Leahy Scale: Fair                              Cognition Arousal/Alertness: Awake/alert Behavior During Therapy: WFL for tasks assessed/performed Overall Cognitive Status: Within Functional Limits for tasks assessed                                        Exercises      General Comments        Pertinent Vitals/Pain Pain Assessment: No/denies  pain    Home Living                      Prior Function            PT Goals (current goals can now be found in the care plan section) Acute Rehab PT Goals Patient Stated Goal: get rid of the oxygen PT Goal Formulation: With patient Time For Goal Achievement: 10/29/18 Potential to Achieve Goals: Good Progress towards PT goals: Progressing toward goals    Frequency    Min 2X/week      PT Plan Current plan remains appropriate    Co-evaluation              AM-PAC PT "6 Clicks" Mobility   Outcome Measure  Help needed turning from your back to your side while in a flat bed without using bedrails?: A Little Help needed moving from lying on your back to sitting on the side of a flat bed without using bedrails?: A Little Help needed moving to and from a bed to a chair (including a wheelchair)?: A Little Help needed standing up from a chair using your arms (e.g., wheelchair or bedside  chair)?: A Little Help needed to walk in hospital room?: A Little Help needed climbing 3-5 steps with a railing? : A Little 6 Click Score: 18    End of Session   Activity Tolerance: Patient tolerated treatment well Patient left: in chair;with call bell/phone within reach Nurse Communication: Mobility status PT Visit Diagnosis: Other abnormalities of gait and mobility (R26.89)     Time: 3785-8850 PT Time Calculation (min) (ACUTE ONLY): 25 min  Charges:  $Gait Training: 8-22 mins $Therapeutic Activity: 8-22 mins                     10/23/2018  McAllen Bing, PT Acute Rehabilitation Services [phone]  (pager) [phone]  (office)   Eliseo Gum Xiao Graul 10/23/2018, 1:23 PM

## 2018-10-23 NOTE — Progress Notes (Signed)
PROGRESS NOTE        PATIENT DETAILS Name: Raymond White Age: 68 y.o. Sex: male Date of Birth: [birth year] Admit Date: 10/21/2018 Admitting Physician Eduard ClosArshad N Kakrakandy, MD [facility]OX[id number]  Brief Narrative: Patient is a 68 y.o. male with history of chronic diastolic heart failure, Parkinson's disease-presenting with several days history of fever, myalgias, shortness of breath-found to have acute hypoxic respiratory failure in the setting of influenza.   Subjective: Feels better-complains of some pain-in the epigastric area-appears to be mostly muscular-from repeated episodes of coughing.  However breathing has markedly improved-down to 2 L of oxygen via nasal cannula this morning--we will titrate down further.  Cough continues.  No fever.  Assessment/Plan: Acute hypoxic respiratory failure: Secondary to influenza A-initially required BiPAP-but has been transitioned to nasal cannula, now on 2 L of oxygen via nasal cannula.  Have asked RN to continue to titrate off oxygen.  Continue Tamiflu, bronchodilators and other supportive care.  Blood cultures negative so far.  Plans are to continue to provide supportive care-and follow-if clinical improvement continues-suspect home on 3/6.   Influenza A: See above  Chronic diastolic heart failure: Compensated-no overt edema-still has a few rales-continue oral Lasix-we will give 1 additional dose of IV Lasix today in the evening.  Continue Coreg.   Parkinson's: Continue Sinemet  Depression: Appears stable-continue with Celexa, Cymbalta and BuSpar  BPH: Continue Flomax  DVT Prophylaxis: Prophylactic Lovenox   Code Status: Full code  Family Communication: None at bedside  Disposition Plan: Remain inpatient  Antimicrobial agents: Anti-infectives (From admission, onward)   Start     Dose/Rate Route Frequency Ordered Stop   10/21/18 2200  vancomycin (VANCOCIN) IVPB 1000 mg/200 mL premix  Status:   Discontinued     1,000 mg 200 mL/hr over 60 Minutes Intravenous Every 12 hours 10/21/18 0638 10/22/18 1045   10/21/18 2200  ceFEPIme (MAXIPIME) 2 g in sodium chloride 0.9 % 100 mL IVPB  Status:  Discontinued     2 g 200 mL/hr over 30 Minutes Intravenous Every 12 hours 10/21/18 0638 10/22/18 1045   10/21/18 2200  oseltamivir (TAMIFLU) capsule 75 mg  Status:  Discontinued     75 mg Oral 2 times daily 10/21/18 0910 10/21/18 0913   10/21/18 0615  oseltamivir (TAMIFLU) capsule 75 mg     75 mg Oral 2 times daily 10/21/18 0606 10/26/18 0959   10/21/18 0445  ceFEPIme (MAXIPIME) 2 g in sodium chloride 0.9 % 100 mL IVPB     2 g 200 mL/hr over 30 Minutes Intravenous  Once 10/21/18 0437 10/21/18 0626   10/21/18 0445  vancomycin (VANCOCIN) IVPB 1000 mg/200 mL premix  Status:  Discontinued     1,000 mg 200 mL/hr over 60 Minutes Intravenous  Once 10/21/18 0437 10/21/18 0438   10/21/18 0445  vancomycin (VANCOCIN) 2,000 mg in sodium chloride 0.9 % 500 mL IVPB     2,000 mg 250 mL/hr over 120 Minutes Intravenous  Once 10/21/18 0438 10/21/18 0843      Procedures: None  CONSULTS:  None  Time spent: 25 minutes-Greater than 50% of this time was spent in counseling, explanation of diagnosis, planning of further management, and coordination of care.  MEDICATIONS: Scheduled Meds: . alum & mag hydroxide-simeth  30 mL Oral Once  . aspirin  81 mg Oral Daily  . atorvastatin  80 mg  Oral QPM  . busPIRone  10 mg Oral TID  . carbidopa-levodopa  2 tablet Oral TID  . carvedilol  6.25 mg Oral BID WC  . citalopram  10 mg Oral QHS  . clopidogrel  75 mg Oral Daily  . docusate sodium  100 mg Oral Q12H  . DULoxetine  90 mg Oral Daily  . enoxaparin (LOVENOX) injection  40 mg Subcutaneous Q24H  . furosemide  40 mg Oral Daily  . gabapentin  700 mg Oral TID  . guaiFENesin  600 mg Oral BID  . ipratropium-albuterol  3 mL Nebulization Q6H  . lidocaine  1 patch Transdermal Q24H  . oseltamivir  75 mg Oral BID  .  pantoprazole  40 mg Oral Daily  . polyethylene glycol  17 g Oral Daily  . sodium chloride flush  3 mL Intravenous Q12H  . tamsulosin  0.4 mg Oral QHS  . thiamine  100 mg Oral Daily   Continuous Infusions: . sodium chloride 10 mL/hr at 10/22/18 0809   PRN Meds:.acetaminophen **OR** acetaminophen, albuterol, benzonatate, HYDROcodone-homatropine, morphine injection, ondansetron (ZOFRAN) IV, oxyCODONE-acetaminophen   PHYSICAL EXAM: Vital signs: Vitals:   10/23/18 0219 10/23/18 0600 10/23/18 0602 10/23/18 0734  BP:  117/77    Pulse:  78  74  Resp:  16  (!) 23  Temp:  98.7 F (37.1 C)    TempSrc:  Oral    SpO2: 97% (!) 85%  98%  Weight:   108.5 kg   Height:       Filed Weights   10/21/18 0404 10/23/18 0602  Weight: 106.6 kg 108.5 kg   Body mass index is 31.56 kg/m.   General appearance:Awake, alert, not in any distress.  Eyes:no scleral icterus. HEENT: Atraumatic and Normocephalic Neck: supple, no JVD. Resp:Good air entry bilaterally, few bibasilar rales CVS: S1 S2 regular, no murmurs.  GI: Bowel sounds present, Non tender and not distended with no gaurding, rigidity or rebound. Extremities: B/L Lower Ext shows no edema, both legs are warm to touch Neurology:  Non focal Psychiatric: Normal judgment and insight. Normal mood. Musculoskeletal:No digital cyanosis Skin:No Rash, warm and dry Wounds:N/A  I have personally reviewed following labs and imaging studies  LABORATORY DATA: CBC: Recent Labs  Lab 10/21/18 0519 10/22/18 0415  WBC 5.7 4.7  NEUTROABS 4.5  --   HGB 11.6* 11.3*  HCT 36.4* 35.2*  MCV 96.6 96.2  PLT 136* 114*    Basic Metabolic Panel: Recent Labs  Lab 10/21/18 0519 10/22/18 0415 10/23/18 0354  NA 137 136 134*  K 3.1* 3.5 3.7  CL 103 104 98  CO2 28 26 25   GLUCOSE 127* 130* 112*  BUN 12 11 9   CREATININE 0.89 0.73 0.70  CALCIUM 8.3* 7.8* 8.0*    GFR: Estimated Creatinine Clearance: 114.1 mL/min (by C-G formula based on SCr of 0.7  mg/dL).  Liver Function Tests: Recent Labs  Lab 10/21/18 0519  AST 19  ALT 18  ALKPHOS 81  BILITOT 1.5*  PROT 5.5*  ALBUMIN 3.1*   No results for input(s): LIPASE, AMYLASE in the last 168 hours. No results for input(s): AMMONIA in the last 168 hours.  Coagulation Profile: Recent Labs  Lab 10/21/18 0519  INR 1.1    Cardiac Enzymes: Recent Labs  Lab 10/21/18 1048 10/21/18 1811  TROPONINI <0.03 <0.03    BNP (last 3 results) No results for input(s): PROBNP in the last 8760 hours.  HbA1C: No results for input(s): HGBA1C in the last 72 hours.  CBG: No results for input(s): GLUCAP in the last 168 hours.  Lipid Profile: No results for input(s): CHOL, HDL, LDLCALC, TRIG, CHOLHDL, LDLDIRECT in the last 72 hours.  Thyroid Function Tests: No results for input(s): TSH, T4TOTAL, FREET4, T3FREE, THYROIDAB in the last 72 hours.  Anemia Panel: No results for input(s): VITAMINB12, FOLATE, FERRITIN, TIBC, IRON, RETICCTPCT in the last 72 hours.  Urine analysis:    Component Value Date/Time   COLORURINE YELLOW 06/30/2018 1148   APPEARANCEUR CLEAR 06/30/2018 1148   LABSPEC 1.020 06/30/2018 1148   PHURINE 6.0 06/30/2018 1148   GLUCOSEU NEGATIVE 06/30/2018 1148   HGBUR SMALL (A) 06/30/2018 1148   BILIRUBINUR NEGATIVE 06/30/2018 1148   KETONESUR 5 (A) 06/30/2018 1148   PROTEINUR NEGATIVE 06/30/2018 1148   NITRITE NEGATIVE 06/30/2018 1148   LEUKOCYTESUR SMALL (A) 06/30/2018 1148    Sepsis Labs: Lactic Acid, Venous    Component Value Date/Time   LATICACIDVEN 1.2 10/21/2018 1048    MICROBIOLOGY: Recent Results (from the past 240 hour(s))  Culture, blood (Routine x 2)     Status: None (Preliminary result)   Collection Time: 10/21/18  4:18 AM  Result Value Ref Range Status   Specimen Description BLOOD LEFT HAND  Final   Special Requests   Final    BOTTLES DRAWN AEROBIC AND ANAEROBIC Blood Culture adequate volume   Culture   Final    NO GROWTH 1 DAY Performed at  [facility], [address] [place], [place] [address]    Report Status PENDING  Incomplete  Culture, blood (Routine x 2)     Status: None (Preliminary result)   Collection Time: 10/21/18  4:22 AM  Result Value Ref Range Status   Specimen Description BLOOD RIGHT HAND  Final   Special Requests   Final    BOTTLES DRAWN AEROBIC AND ANAEROBIC Blood Culture adequate volume   Culture   Final    NO GROWTH 1 DAY Performed at [facility], [address] [place], [place] [address]    Report Status PENDING  Incomplete    RADIOLOGY STUDIES/RESULTS: Dg Chest 2 View  Result Date: 10/21/2018 CLINICAL DATA:  Shortness of breath and suspected sepsis EXAM: CHEST - 2 VIEW COMPARISON:  06/27/2018 FINDINGS: Low volume chest with chronic asymmetric elevation of the right diaphragm. Question prior diaphragmatic rupture and repair based on CT 11/22/2017. There is overlying right lower lobe scarring costophrenic sulcus blunting. Remote right rib fractures. Normal heart size. There is no edema, consolidation, effusion, or pneumothorax. IMPRESSION: 1. No evidence of acute disease. 2. Chronic right base scarring over the elevated diaphragm. Electronically Signed   By: Marnee Spring M.D.   On: 10/21/2018 05:07   Dg Chest Port 1v Same Day  Result Date: 10/23/2018 CLINICAL DATA:  Shortness of Breath EXAM: PORTABLE CHEST 1 VIEW COMPARISON:  10/21/2018 FINDINGS: Cardiomegaly. Elevation of the right hemidiaphragm. Vascular congestion, small right effusion and right base atelectasis or infiltrate. No acute bony abnormality. IMPRESSION: Stable elevation of the right hemidiaphragm. Cardiomegaly with vascular congestion. Small right effusion with right base atelectasis or infiltrate. Electronically Signed   By: Charlett Nose M.D.   On: 10/23/2018 10:19     LOS: 1 day   Jeoffrey Massed, MD  [facility]  If 7PM-7AM, please contact night-coverage  Please page via [email or web address]  Go to [email or web address] and  use [facility] universal password to access. If you do not have the password, please contact the hospital operator.  Locate the [facility] provider  you are looking for under [facility] and page to a number that you can be directly reached. If you still have difficulty reaching the provider, please page the [facility] (Director on Call) for the Hospitalists listed on amion for assistance.  10/23/2018, 11:02 AM

## 2018-10-24 DIAGNOSIS — I10 Essential (primary) hypertension: Secondary | ICD-10-CM

## 2018-10-24 MED ORDER — OSELTAMIVIR PHOSPHATE 75 MG PO CAPS: 75.0000 mg | ORAL_CAPSULE | Freq: Two times a day (BID) | ORAL | 0 refills | Status: AC

## 2018-10-24 MED ORDER — OXYCODONE-ACETAMINOPHEN 5-325 MG PO TABS: 0.5000 | ORAL_TABLET | Freq: Every day | ORAL | 0 refills | Status: DC | PRN

## 2018-10-24 MED ORDER — GUAIFENESIN ER 600 MG PO TB12: 600.0000 mg | ORAL_TABLET | Freq: Two times a day (BID) | ORAL | Status: DC

## 2018-10-24 MED ORDER — HYDROCODONE-HOMATROPINE 5-1.5 MG/5ML PO SYRP: 5.0000 mL | ORAL_SOLUTION | Freq: Four times a day (QID) | ORAL | 0 refills | Status: DC | PRN

## 2018-10-24 NOTE — Discharge Summary (Signed)
PATIENT DETAILS Name: Raymond White Age: 68 y.o. Sex: male Date of Birth: 06-04-1951 MRN: [id number]. Admitting Physician: Eduard ClosArshad N Kakrakandy, MD WUJ:[id number]:[facility]  Admit Date: 10/21/2018 Discharge date: 10/24/2018  Recommendations for Outpatient Follow-up:  1. Follow up with PCP in 1-2 weeks 2. Please obtain BMP/CBC in one week  Admitted From:  Home  Disposition: Home   Home Health: No  Equipment/Devices: None  Discharge Condition: Stable  CODE STATUS: FULL CODE  Diet recommendation:  Heart Healthy  Brief Summary: See H&P, Labs, Consult and Test reports for all details in brief, Patient is a 68 y.o. male with history of chronic diastolic heart failure, Parkinson's disease-presenting with several days history of fever, myalgias, shortness of breath-found to have acute hypoxic respiratory failure in the setting of influenza.   Brief Hospital Course: Acute hypoxic respiratory failure: Secondary to influenza A-initially required BiPAP-but subsequently has been titrated down slowly to room air.  Started on Tamiflu-this will be continued on discharge for 3 more doses.  Continue other supportive care.  Blood cultures negative.  Since markedly improved-afebrile-stable to be discharged back to ALF/SNF.  Influenza A: See above  Chronic diastolic heart failure: Compensated-continue Lasix.  Due to some minimal bibasilar rales-he did receive a few doses of IV Lasix.  Continue Coreg on discharge.   Parkinson's: Continue Sinemet  Depression: Appears stable-continue with Celexa, Cymbalta and BuSpar  BPH: Continue Flomax  Procedures/Studies: None  Discharge Diagnoses:  Principal Problem:   Acute respiratory failure with hypoxia (HCC) Active Problems:   Chronic heart failure with preserved ejection fraction (HCC)   Hypertension   Influenza A   Parkinson disease (HCC)   Respiratory failure with hypoxia [facility](HCC)   Discharge Instructions:  Activity:  As  tolerated with Full fall precautions use walker/cane & assistance as needed   Discharge Instructions    Diet - low sodium heart healthy   Complete by:  As directed    Discharge instructions   Complete by:  As directed    Follow with Primary MD  [facility]  Please get a complete blood count and chemistry panel checked by your Primary MD at your next visit, and again as instructed by your Primary MD.  Get Medicines reviewed and adjusted: Please take all your medications with you for your next visit with your Primary MD  Laboratory/radiological data: Please request your Primary MD to go over all hospital tests and procedure/radiological results at the follow up, please ask your Primary MD to get all Hospital records sent to his/her office.  In some cases, they will be blood work, cultures and biopsy results pending at the time of your discharge. Please request that your primary care M.D. follows up on these results.  Also Note the following: If you experience worsening of your admission symptoms, develop shortness of breath, life threatening emergency, suicidal or homicidal thoughts you must seek medical attention immediately by calling 911 or calling your MD immediately  if symptoms less severe.  You must read complete instructions/literature along with all the possible adverse reactions/side effects for all the Medicines you take and that have been prescribed to you. Take any new Medicines after you have completely understood and accpet all the possible adverse reactions/side effects.   Do not drive when taking Pain medications or sleeping medications (Benzodaizepines)  Do not take more than prescribed Pain, Sleep and Anxiety Medications. It is not advisable to combine anxiety,sleep and pain medications without talking with your primary care  practitioner  Special Instructions: If you have smoked or chewed Tobacco  in the last 2 yrs please stop smoking, stop any  regular Alcohol  and or any Recreational drug use.  Wear Seat belts while driving.  Please note: You were cared for by a hospitalist during your hospital stay. Once you are discharged, your primary care physician will handle any further medical issues. Please note that NO REFILLS for any discharge medications will be authorized once you are discharged, as it is imperative that you return to your primary care physician (or establish a relationship with a primary care physician if you do not have one) for your post hospital discharge needs so that they can reassess your need for medications and monitor your lab values.   Increase activity slowly   Complete by:  As directed      Allergies as of 10/24/2018      Reactions   Fish-derived Products Anaphylaxis   Sulfamethizole Rash      Medication List    TAKE these medications   acetaminophen 500 MG tablet Commonly known as:  TYLENOL Take 1,000 mg by mouth every 6 (six) hours as needed for fever.   albuterol 108 (90 Base) MCG/ACT inhaler Commonly known as:  PROVENTIL HFA;VENTOLIN HFA Inhale 2 puffs into the lungs every 6 (six) hours as needed for wheezing or shortness of breath. Wait 1 minute in between each puff   aspirin 81 MG chewable tablet Chew 81 mg by mouth daily.   atorvastatin 80 MG tablet Commonly known as:  LIPITOR Take 80 mg by mouth every evening.   benzonatate 100 MG capsule Commonly known as:  TESSALON Take 100 mg by mouth 3 (three) times daily as needed for cough.   busPIRone 10 MG tablet Commonly known as:  BUSPAR Take 10 mg by mouth 3 (three) times daily.   carbidopa-levodopa 25-100 MG tablet Commonly known as:  SINEMET IR Take 2 tablets by mouth 3 (three) times daily.   carvedilol 3.125 MG tablet Commonly known as:  COREG Take 6.25 mg by mouth 2 (two) times daily with a meal.   citalopram 10 MG tablet Commonly known as:  CELEXA Take 10 mg by mouth at bedtime.   clopidogrel 75 MG tablet Commonly known  as:  PLAVIX Take 75 mg by mouth daily.   docusate sodium 100 MG capsule Commonly known as:  COLACE Take 1 capsule (100 mg total) by mouth every 12 (twelve) hours.   DULoxetine 30 MG capsule Commonly known as:  CYMBALTA Take 90 mg by mouth daily.   EPINEPHrine 0.3 mg/0.3 mL Soaj injection Commonly known as:  EPI-PEN Inject 0.3 mg into the muscle daily as needed for anaphylaxis.   furosemide 20 MG tablet Commonly known as:  LASIX Take 20 mg by mouth daily.   gabapentin 600 MG tablet Commonly known as:  NEURONTIN Take 700 mg by mouth 3 (three) times daily. Take along with a 100 mg capsule   guaiFENesin 600 MG 12 hr tablet Commonly known as:  MUCINEX Take 1 tablet (600 mg total) by mouth 2 (two) times daily.   HYDROcodone-homatropine 5-1.5 MG/5ML syrup Commonly known as:  HYCODAN Take 5 mLs by mouth every 6 (six) hours as needed for cough (unrelieved by tessalon).   ipratropium-albuterol 0.5-2.5 (3) MG/3ML Soln Commonly known as:  DUONEB Take 3 mLs by nebulization every 6 (six) hours as needed (wheezing, SOB).   Lido King 4 % Ptch Generic drug:  Lidocaine Apply 1 patch topically daily.  meloxicam 7.5 MG tablet Commonly known as:  MOBIC Take 7.5 mg by mouth daily.   omeprazole 20 MG capsule Commonly known as:  PRILOSEC Take 20 mg by mouth 2 (two) times daily before a meal.   oseltamivir 75 MG capsule Commonly known as:  TAMIFLU Take 1 capsule (75 mg total) by mouth 2 (two) times daily for 3 doses.   oxyCODONE-acetaminophen 5-325 MG tablet Commonly known as:  PERCOCET/ROXICET Take 0.5 tablets by mouth daily as needed for severe pain.   polyethylene glycol packet Commonly known as:  MIRALAX / GLYCOLAX Take 17 g by mouth daily.   potassium chloride SA 20 MEQ tablet Commonly known as:  K-DUR,KLOR-CON Take 20 mEq by mouth daily. With food   tamsulosin 0.4 MG Caps capsule Commonly known as:  FLOMAX Take 0.4 mg by mouth at bedtime.   thiamine 100 MG  tablet Commonly known as:  VITAMIN B-1 Take 100 mg by mouth daily.       Contact information for follow-up providers    [facility] Schedule an appointment as soon as possible for a visit in 2 week(s).   Contact information: [address] [facility] [place] [address] [phone]            Contact information for after-discharge care    Destination    [facility] .   Service:  Skilled Nursing Contact information: [address] [place] [address] [phone]                 Allergies  Allergen Reactions  . Fish-Derived Products Anaphylaxis  . Sulfamethizole Rash    Consultations:   None   Other Procedures/Studies: Dg Chest 2 View  Result Date: 10/21/2018 CLINICAL DATA:  Shortness of breath and suspected sepsis EXAM: CHEST - 2 VIEW COMPARISON:  06/27/2018 FINDINGS: Low volume chest with chronic asymmetric elevation of the right diaphragm. Question prior diaphragmatic rupture and repair based on CT 11/22/2017. There is overlying right lower lobe scarring costophrenic sulcus blunting. Remote right rib fractures. Normal heart size. There is no edema, consolidation, effusion, or pneumothorax. IMPRESSION: 1. No evidence of acute disease. 2. Chronic right base scarring over the elevated diaphragm. Electronically Signed   By: Marnee Spring M.D.   On: 10/21/2018 05:07   Dg Chest Port 1v Same Day  Result Date: 10/23/2018 CLINICAL DATA:  Shortness of Breath EXAM: PORTABLE CHEST 1 VIEW COMPARISON:  10/21/2018 FINDINGS: Cardiomegaly. Elevation of the right hemidiaphragm. Vascular congestion, small right effusion and right base atelectasis or infiltrate. No acute bony abnormality. IMPRESSION: Stable elevation of the right hemidiaphragm. Cardiomegaly with vascular congestion. Small right effusion with right base atelectasis or infiltrate. Electronically Signed   By: Charlett Nose M.D.   On: 10/23/2018 10:19      TODAY-DAY  OF DISCHARGE:  Subjective:   Raymond White today has no headache,no chest abdominal pain,no new weakness tingling or numbness, feels much better wants to go home today.   Objective:   Blood pressure 113/70, pulse 69, temperature 98 F (36.7 C), temperature source Oral, resp. rate 18, height  (1.854 m), weight 107 kg, SpO2 96 %.  Intake/Output Summary (Last 24 hours) at 10/24/2018 0838 Last data filed at 10/24/2018 0606 Gross per 24 hour  Intake 888.5 ml  Output 2200 ml  Net -1311.5 ml   Filed Weights   10/21/18 0404 10/23/18 0602 10/24/18 0500  Weight: 106.6 kg 108.5 kg 107 kg    Exam: Awake Alert, Oriented *3, No new F.N  deficits, Normal affect [place].AT,PERRAL Supple Neck,No JVD, No cervical lymphadenopathy appriciated.  Symmetrical Chest wall movement, Good air movement bilaterally, CTAB RRR,No Gallops,Rubs or new Murmurs, No Parasternal Heave +ve B.Sounds, Abd Soft, Non tender, No organomegaly appriciated, No rebound -guarding or rigidity. No Cyanosis, Clubbing or edema, No new Rash or bruise   PERTINENT RADIOLOGIC STUDIES: Dg Chest 2 View  Result Date: 10/21/2018 CLINICAL DATA:  Shortness of breath and suspected sepsis EXAM: CHEST - 2 VIEW COMPARISON:  06/27/2018 FINDINGS: Low volume chest with chronic asymmetric elevation of the right diaphragm. Question prior diaphragmatic rupture and repair based on CT 11/22/2017. There is overlying right lower lobe scarring costophrenic sulcus blunting. Remote right rib fractures. Normal heart size. There is no edema, consolidation, effusion, or pneumothorax. IMPRESSION: 1. No evidence of acute disease. 2. Chronic right base scarring over the elevated diaphragm. Electronically Signed   By: Marnee Spring M.D.   On: 10/21/2018 05:07   Dg Chest Port 1v Same Day  Result Date: 10/23/2018 CLINICAL DATA:  Shortness of Breath EXAM: PORTABLE CHEST 1 VIEW COMPARISON:  10/21/2018 FINDINGS: Cardiomegaly. Elevation of the right hemidiaphragm.  Vascular congestion, small right effusion and right base atelectasis or infiltrate. No acute bony abnormality. IMPRESSION: Stable elevation of the right hemidiaphragm. Cardiomegaly with vascular congestion. Small right effusion with right base atelectasis or infiltrate. Electronically Signed   By: Charlett Nose M.D.   On: 10/23/2018 10:19     PERTINENT LAB RESULTS: CBC: Recent Labs    10/22/18 0415  WBC 4.7  HGB 11.3*  HCT 35.2*  PLT 114*   CMET CMP     Component Value Date/Time   NA 134 (L) 10/23/2018 0354   NA 144 12/17/2017   K 3.7 10/23/2018 0354   CL 98 10/23/2018 0354   CO2 25 10/23/2018 0354   GLUCOSE 112 (H) 10/23/2018 0354   BUN 9 10/23/2018 0354   BUN 11 12/17/2017   CREATININE 0.70 10/23/2018 0354   CALCIUM 8.0 (L) 10/23/2018 0354   PROT 5.5 (L) 10/21/2018 0519   ALBUMIN 3.1 (L) 10/21/2018 0519   AST 19 10/21/2018 0519   ALT 18 10/21/2018 0519   ALKPHOS 81 10/21/2018 0519   BILITOT 1.5 (H) 10/21/2018 0519   GFRNONAA >60 10/23/2018 0354   GFRAA >60 10/23/2018 0354    GFR Estimated Creatinine Clearance: 113.4 mL/min (by C-G formula based on SCr of 0.7 mg/dL). No results for input(s): LIPASE, AMYLASE in the last 72 hours. Recent Labs    10/21/18 1048 10/21/18 1811  TROPONINI <0.03 <0.03   Invalid input(s): POCBNP No results for input(s): DDIMER in the last 72 hours. No results for input(s): HGBA1C in the last 72 hours. No results for input(s): CHOL, HDL, LDLCALC, TRIG, CHOLHDL, LDLDIRECT in the last 72 hours. No results for input(s): TSH, T4TOTAL, T3FREE, THYROIDAB in the last 72 hours.  Invalid input(s): FREET3 No results for input(s): VITAMINB12, FOLATE, FERRITIN, TIBC, IRON, RETICCTPCT in the last 72 hours. Coags: No results for input(s): INR in the last 72 hours.  Invalid input(s): PT Microbiology: Recent Results (from the past 240 hour(s))  Culture, blood (Routine x 2)     Status: None (Preliminary result)   Collection Time: 10/21/18  4:18 AM   Result Value Ref Range Status   Specimen Description BLOOD LEFT HAND  Final   Special Requests   Final    BOTTLES DRAWN AEROBIC AND ANAEROBIC Blood Culture adequate volume   Culture   Final    NO GROWTH 2 DAYS  Performed at [facility], [address] [place], [place] [address]    Report Status PENDING  Incomplete  Culture, blood (Routine x 2)     Status: None (Preliminary result)   Collection Time: 10/21/18  4:22 AM  Result Value Ref Range Status   Specimen Description BLOOD RIGHT HAND  Final   Special Requests   Final    BOTTLES DRAWN AEROBIC AND ANAEROBIC Blood Culture adequate volume   Culture   Final    NO GROWTH 2 DAYS Performed at [facility], [address] [place], [place] [address]    Report Status PENDING  Incomplete    FURTHER DISCHARGE INSTRUCTIONS:  Get Medicines reviewed and adjusted: Please take all your medications with you for your next visit with your Primary MD  Laboratory/radiological data: Please request your Primary MD to go over all hospital tests and procedure/radiological results at the follow up, please ask your Primary MD to get all Hospital records sent to his/her office.  In some cases, they will be blood work, cultures and biopsy results pending at the time of your discharge. Please request that your primary care M.D. goes through all the records of your hospital data and follows up on these results.  Also Note the following: If you experience worsening of your admission symptoms, develop shortness of breath, life threatening emergency, suicidal or homicidal thoughts you must seek medical attention immediately by calling 911 or calling your MD immediately  if symptoms less severe.  You must read complete instructions/literature along with all the possible adverse reactions/side effects for all the Medicines you take and that have been prescribed to you. Take any new Medicines after you have completely understood and accpet all the  possible adverse reactions/side effects.   Do not drive when taking Pain medications or sleeping medications (Benzodaizepines)  Do not take more than prescribed Pain, Sleep and Anxiety Medications. It is not advisable to combine anxiety,sleep and pain medications without talking with your primary care practitioner  Special Instructions: If you have smoked or chewed Tobacco  in the last 2 yrs please stop smoking, stop any regular Alcohol  and or any Recreational drug use.  Wear Seat belts while driving.  Please note: You were cared for by a hospitalist during your hospital stay. Once you are discharged, your primary care physician will handle any further medical issues. Please note that NO REFILLS for any discharge medications will be authorized once you are discharged, as it is imperative that you return to your primary care physician (or establish a relationship with a primary care physician if you do not have one) for your post hospital discharge needs so that they can reassess your need for medications and monitor your lab values.  Total Time spent coordinating discharge including counseling, education and face to face time equals 35 minutes.  SignedJeoffrey Massed 10/24/2018 8:38 AM

## 2018-10-24 NOTE — Plan of Care (Signed)
  Problem: Clinical Measurements: Goal: Ability to maintain clinical measurements within normal limits will improve Outcome: Progressing Goal: Diagnostic test results will improve Outcome: Progressing Goal: Respiratory complications will improve Outcome: Progressing   

## 2018-10-24 NOTE — Progress Notes (Signed)
Patient discharged to [facility] via [place].

## 2018-10-24 NOTE — Progress Notes (Signed)
Patient will DC to: [place] Anticipated DC date: 10/24/18 Family notified: Patient declined Transport by: Sharin Mons   Per MD patient ready for DC to [facility]. RN, patient, patient's family, and facility notified of DC. Discharge Summary and FL2 sent to facility. RN to call report prior to discharge [phone]). DC packet on chart. Ambulance transport requested for patient.   CSW will sign off for now as social work intervention is no longer needed. Please consult [place] again if new needs arise.  Cristobal Goldmann, LCSW Clinical Social Worker [phone]

## 2018-10-24 NOTE — Progress Notes (Signed)
Attempted to call report to [place] twice but there was no answer from the nurse. Facility can reach me at [phone] to receive report.

## 2018-10-26 LAB — CULTURE, BLOOD (ROUTINE X 2)
Culture: NO GROWTH
Culture: NO GROWTH
Special Requests: ADEQUATE
Special Requests: ADEQUATE

## 2019-05-25 ENCOUNTER — Emergency Department (HOSPITAL_BASED_OUTPATIENT_CLINIC_OR_DEPARTMENT_OTHER): Payer: Medicare Other

## 2019-05-25 ENCOUNTER — Encounter (HOSPITAL_BASED_OUTPATIENT_CLINIC_OR_DEPARTMENT_OTHER): Payer: Self-pay | Admitting: *Deleted

## 2019-05-25 ENCOUNTER — Other Ambulatory Visit: Payer: Self-pay

## 2019-05-25 ENCOUNTER — Emergency Department (HOSPITAL_BASED_OUTPATIENT_CLINIC_OR_DEPARTMENT_OTHER)
Admission: EM | Admit: 2019-05-25 | Discharge: 2019-05-25 | Disposition: A | Discharge status: Home / Self Care | Payer: Medicare Other | Attending: Emergency Medicine | Admitting: Emergency Medicine

## 2019-05-25 DIAGNOSIS — I509 Heart failure, unspecified: Secondary | ICD-10-CM | POA: Insufficient documentation

## 2019-05-25 DIAGNOSIS — E876 Hypokalemia: Secondary | ICD-10-CM | POA: Diagnosis not present

## 2019-05-25 DIAGNOSIS — R2243 Localized swelling, mass and lump, lower limb, bilateral: Secondary | ICD-10-CM | POA: Insufficient documentation

## 2019-05-25 DIAGNOSIS — Z7982 Long term (current) use of aspirin: Secondary | ICD-10-CM | POA: Insufficient documentation

## 2019-05-25 DIAGNOSIS — R0602 Shortness of breath: Secondary | ICD-10-CM | POA: Diagnosis present

## 2019-05-25 DIAGNOSIS — Z79899 Other long term (current) drug therapy: Secondary | ICD-10-CM | POA: Diagnosis not present

## 2019-05-25 DIAGNOSIS — I110 Hypertensive heart disease with heart failure: Secondary | ICD-10-CM | POA: Insufficient documentation

## 2019-05-25 DIAGNOSIS — G20 Parkinson's disease: Secondary | ICD-10-CM | POA: Diagnosis not present

## 2019-05-25 LAB — URINALYSIS, ROUTINE W REFLEX MICROSCOPIC
Bilirubin Urine: NEGATIVE
Glucose, UA: NEGATIVE mg/dL
Hgb urine dipstick: NEGATIVE
Ketones, ur: NEGATIVE mg/dL
Leukocytes,Ua: NEGATIVE
Nitrite: NEGATIVE
Protein, ur: NEGATIVE mg/dL
Specific Gravity, Urine: 1.015 (ref 1.005–1.030)
pH: 7 (ref 5.0–8.0)

## 2019-05-25 LAB — BASIC METABOLIC PANEL
Anion gap: 12 (ref 5–15)
BUN: 16 mg/dL (ref 8–23)
CO2: 30 mmol/L (ref 22–32)
Calcium: 8.4 mg/dL — ABNORMAL LOW (ref 8.9–10.3)
Chloride: 97 mmol/L — ABNORMAL LOW (ref 98–111)
Creatinine, Ser: 0.84 mg/dL (ref 0.61–1.24)
GFR calc Af Amer: >60 mL/min (ref >60)
GFR calc non Af Amer: >60 mL/min (ref >60)
Glucose, Bld: 118 mg/dL — ABNORMAL HIGH (ref 70–99)
Potassium: 3 mmol/L — ABNORMAL LOW (ref 3.5–5.1)
Sodium: 139 mmol/L (ref 135–145)

## 2019-05-25 LAB — CBC WITH DIFFERENTIAL/PLATELET
Abs Immature Granulocytes: 0.04 10*3/uL (ref 0.00–0.07)
Basophils Absolute: 0 10*3/uL (ref 0.0–0.1)
Basophils Relative: 0 %
Eosinophils Absolute: 0.1 10*3/uL (ref 0.0–0.5)
Eosinophils Relative: 2 %
HCT: 42.6 % (ref 39.0–52.0)
Hemoglobin: 13.9 g/dL (ref 13.0–17.0)
Immature Granulocytes: 1 %
Lymphocytes Relative: 15 %
Lymphs Abs: 1.1 10*3/uL (ref 0.7–4.0)
MCH: 31 pg (ref 26.0–34.0)
MCHC: 32.6 g/dL (ref 30.0–36.0)
MCV: 95.1 fL (ref 80.0–100.0)
Monocytes Absolute: 0.7 10*3/uL (ref 0.1–1.0)
Monocytes Relative: 9 %
Neutro Abs: 5.7 10*3/uL (ref 1.7–7.7)
Neutrophils Relative %: 73 %
Platelets: 171 10*3/uL (ref 150–400)
RBC: 4.48 MIL/uL (ref 4.22–5.81)
RDW: 13.6 % (ref 11.5–15.5)
WBC: 7.7 10*3/uL (ref 4.0–10.5)
nRBC: 0 % (ref 0.0–0.2)

## 2019-05-25 LAB — TROPONIN I (HIGH SENSITIVITY)
Troponin I (High Sensitivity): 5 ng/L (ref <18)
Troponin I (High Sensitivity): 4 ng/L (ref <18)

## 2019-05-25 LAB — BRAIN NATRIURETIC PEPTIDE: B Natriuretic Peptide: 60.5 pg/mL (ref 0.0–100.0)

## 2019-05-25 LAB — D-DIMER, QUANTITATIVE: D-Dimer, Quant: 0.47 ug/mL-FEU (ref 0.00–0.50)

## 2019-05-25 MED ORDER — POTASSIUM CHLORIDE CRYS ER 20 MEQ PO TBCR
40.0000 meq | EXTENDED_RELEASE_TABLET | Freq: Once | ORAL | Status: AC
  Administered 2019-05-25: 40 meq via ORAL
  Filled 2019-05-25: qty 2

## 2019-05-25 MED ORDER — IPRATROPIUM-ALBUTEROL 0.5-2.5 (3) MG/3ML IN SOLN: 3.0000 mL | Freq: Four times a day (QID) | RESPIRATORY_TRACT | Status: DC

## 2019-05-25 MED ORDER — ALBUTEROL SULFATE HFA 108 (90 BASE) MCG/ACT IN AERS
2.0000 | INHALATION_SPRAY | Freq: Four times a day (QID) | RESPIRATORY_TRACT | Status: DC
  Administered 2019-05-25: 2 via RESPIRATORY_TRACT
  Filled 2019-05-25: qty 6.7

## 2019-05-25 NOTE — ED Triage Notes (Signed)
Has history of congestive heart failure.  Shortness of breath started 2 days ago and it gets worst. He stated that he is swollen all-over.

## 2019-05-25 NOTE — Progress Notes (Signed)
PT walked on room air  sats stayed at 97%

## 2019-05-25 NOTE — ED Notes (Signed)
Patient requested to be put on an oxygen to help him breath.  O2 1 lpm given.  O2 sat started at 95% and went up to 96%.  He stated that he feels a little better but not much.

## 2019-05-25 NOTE — ED Notes (Signed)
2 unsuccessful IV attempts.

## 2019-05-25 NOTE — ED Provider Notes (Signed)
[facility] EMERGENCY DEPARTMENT Provider Note   CSN: [id number] Arrival date & time: 05/25/19  0902     History   Chief Complaint Chief Complaint  Patient presents with   Shortness of Breath    HPI Raymond White is a 68 y.o. male with PMHx HTN, GERD, CHF with recent EF 60-65%, CAD s/p stent who presents to the ED today planing of gradual onset, constant, shortness of breath that began 2 days ago.  He reports history of congestive heart failure and believes that he is having an exacerbation.  He states that he feels more swollen in his bilateral lower extremities as well as his abdomen.  Patient does not do daily weights and cannot tell me if he has gained any weight.  He states that his shoes and pants feel tighter than normal.  He also endorses sudden onset, intermittent, sharp, left-sided chest pain for the past 2 days as well.  No radiation of pain to his extremities or jaw.  He states he took another dose of his Lasix yesterday without relief.  Recent prolonged travel or immobilization.  No history DVT/PE.  No hemoptysis.  No active malignancy.  Patient is a never smoker.  Denies fever, chills, nausea, vomiting, any other associated symptoms. No recent sick contacts/Covid 19 positive exposures.        Past Medical History:  Diagnosis Date   Acute cystitis with hematuria    Acute midline low back pain with sciatica    Chronic heart failure with preserved ejection fraction (HCC)    DDD (degenerative disc disease), lumbar    Depressive disorder    Drug induced constipation    GERD (gastroesophageal reflux disease)    History of TIA (transient ischemic attack)    Hypertension    Parkinson's disease (HCC) 2015   Uncomplicated alcohol dependence (HCC)    Urinary incontinence     Patient Active Problem List   Diagnosis Date Noted   Respiratory failure with hypoxia (HCC) 10/22/2018   Acute respiratory failure with hypoxia (HCC) 10/21/2018    Influenza A 10/21/2018   Parkinson disease (HCC) 10/21/2018   DDD (degenerative disc disease), lumbar 12/15/2017   Klebsiella cystitis 12/15/2017   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   Chronic heart failure with preserved ejection fraction (HCC) 12/15/2017   GERD (gastroesophageal reflux disease) 12/15/2017   Hypertension 12/15/2017   Depressive disorder 12/15/2017   Anxiety 12/15/2017    Past Surgical History:  Procedure Laterality Date   BACK SURGERY     x5   CHOLECYSTECTOMY     KNEE ARTHROSCOPY          Home Medications    Prior to Admission medications   Medication Sig Start Date End Date Taking? Authorizing Provider  furosemide (LASIX) 20 MG tablet Take 20 mg by mouth daily.    Yes [provider]  acetaminophen (TYLENOL) 500 MG tablet Take 1,000 mg by mouth every 6 (six) hours as needed for fever.    [provider]  albuterol (PROVENTIL HFA;VENTOLIN HFA) 108 (90 Base) MCG/ACT inhaler Inhale 2 puffs into the lungs every 6 (six) hours as needed for wheezing or shortness of breath. Wait 1 minute in between each puff    [provider]  aspirin 81 MG chewable tablet Chew 81 mg by mouth daily.    [provider]  atorvastatin (LIPITOR) 80 MG tablet Take 80 mg by mouth every evening.    [provider]  benzonatate (TESSALON) 100 MG capsule Take 100  mg by mouth 3 (three) times daily as needed for cough.    [provider]  busPIRone (BUSPAR) 10 MG tablet Take 10 mg by mouth 3 (three) times daily.    [provider]  carbidopa-levodopa (SINEMET IR) 25-100 MG tablet Take 2 tablets by mouth 3 (three) times daily.     [provider]  carvedilol (COREG) 3.125 MG tablet Take 6.25 mg by mouth 2 (two) times daily with a meal.    [provider]  citalopram (CELEXA) 10 MG tablet Take 10 mg by mouth at bedtime.    [provider]  clopidogrel (PLAVIX) 75 MG tablet Take 75 mg by mouth daily.     [provider]  docusate sodium (COLACE) 100 MG capsule Take 1 capsule (100 mg total) by mouth every 12 (twelve) hours. 06/27/18   Caccavale, Sophia, PA-C  DULoxetine (CYMBALTA) 30 MG capsule Take 90 mg by mouth daily.     [provider]  EPINEPHrine 0.3 mg/0.3 mL IJ SOAJ injection Inject 0.3 mg into the muscle daily as needed for anaphylaxis.    [provider]  gabapentin (NEURONTIN) 600 MG tablet Take 700 mg by mouth 3 (three) times daily. Take along with a 100 mg capsule    [provider]  guaiFENesin (MUCINEX) 600 MG 12 hr tablet Take 1 tablet (600 mg total) by mouth 2 (two) times daily. 10/24/18   Ghimire, Henreitta Leber, MD  HYDROcodone-homatropine (HYCODAN) 5-1.5 MG/5ML syrup Take 5 mLs by mouth every 6 (six) hours as needed for cough (unrelieved by tessalon). 10/24/18   Ghimire, Henreitta Leber, MD  ipratropium-albuterol (DUONEB) 0.5-2.5 (3) MG/3ML SOLN Take 3 mLs by nebulization every 6 (six) hours as needed (wheezing, SOB).    [provider]  Lidocaine (LIDO KING) 4 % PTCH Apply 1 patch topically daily.    [provider]  meloxicam (MOBIC) 7.5 MG tablet Take 7.5 mg by mouth daily.    [provider]  omeprazole (PRILOSEC) 20 MG capsule Take 20 mg by mouth 2 (two) times daily before a meal.    [provider]  oxyCODONE-acetaminophen (PERCOCET/ROXICET) 5-325 MG tablet Take 0.5 tablets by mouth daily as needed for severe pain. 10/24/18   Ghimire, Henreitta Leber, MD  polyethylene glycol (MIRALAX / Floria Raveling) packet Take 17 g by mouth daily. 06/27/18   Caccavale, Sophia, PA-C  potassium chloride SA (K-DUR,KLOR-CON) 20 MEQ tablet Take 20 mEq by mouth daily. With food    [provider]  tamsulosin (FLOMAX) 0.4 MG CAPS capsule Take 0.4 mg by mouth at bedtime.    [provider]  thiamine (VITAMIN B-1) 100 MG tablet Take 100 mg by mouth daily.    [provider]    Family History Family History  Problem Relation  Age of Onset   Hyperlipidemia Mother    Cancer Father     Social History Social History   Tobacco Use   Smoking status: Never Smoker   Smokeless tobacco: Never Used  Substance Use Topics   Alcohol use: Not Currently    Comment: Vodka   Drug use: Never     Allergies   Fish-derived products, Zoloft [sertraline hcl], and Sulfamethizole   Review of Systems Review of Systems  Constitutional: Negative for chills and fever.  HENT: Negative for congestion.   Eyes: Negative for visual disturbance.  Respiratory: Positive for shortness of breath. Negative for cough.   Cardiovascular: Positive for chest pain and leg swelling (bilaterally).  Gastrointestinal: Negative for abdominal  pain, nausea and vomiting.  Genitourinary: Negative for difficulty urinating.  Musculoskeletal: Negative for myalgias.  Skin: Negative for rash.  Neurological: Negative for headaches.     Physical Exam Updated Vital Signs BP 127/83    Pulse 94    Temp 98.4 F (36.9 C) (Oral)    Resp 18    Ht 6\' 1"  (1.854 m)    Wt 112.1 kg    SpO2 97%    BMI 32.61 kg/m   Physical Exam Vitals signs and nursing note reviewed.  Constitutional:      Appearance: He is obese. He is not ill-appearing.  HENT:     Head: Normocephalic and atraumatic.  Eyes:     Conjunctiva/sclera: Conjunctivae normal.  Neck:     Musculoskeletal: Neck supple.  Cardiovascular:     Rate and Rhythm: Normal rate and regular rhythm.     Pulses: Normal pulses.  Pulmonary:     Effort: Pulmonary effort is normal.     Breath sounds: Rhonchi present. No decreased breath sounds, wheezing or rales.  Chest:     Chest wall: No tenderness.  Abdominal:     General: There is distension.     Palpations: Abdomen is soft.     Tenderness: There is no abdominal tenderness. There is no guarding or rebound.  Musculoskeletal:     Right lower leg: Edema present.     Left lower leg: Edema present.     Comments: 1+ pitting edema bilaterally  Skin:     General: Skin is warm and dry.  Neurological:     Mental Status: He is alert.      ED Treatments / Results  Labs (all labs ordered are listed, but only abnormal results are displayed) Labs Reviewed  BASIC METABOLIC PANEL - Abnormal; Notable for the following components:      Result Value   Potassium 3.0 (*)    Chloride 97 (*)    Glucose, Bld 118 (*)    Calcium 8.4 (*)    All other components within normal limits  BRAIN NATRIURETIC PEPTIDE  CBC WITH DIFFERENTIAL/PLATELET  URINALYSIS, ROUTINE W REFLEX MICROSCOPIC  D-DIMER, QUANTITATIVE (NOT AT [facility])  TROPONIN I (HIGH SENSITIVITY)  TROPONIN I (HIGH SENSITIVITY)    EKG EKG Interpretation  Date/Time:  Monday May 25 2019 09:12:19 EDT Ventricular Rate:  94 PR Interval:    QRS Duration: 107 QT Interval:  377 QTC Calculation: 472 R Axis:   65 Text Interpretation:  Sinus rhythm Low voltage, extremity and precordial leads No significant change since last tracing Confirmed by Raeford RazorKohut, Stephen [phone]([id number]) on 05/25/2019 9:24:06 AM   Radiology Dg Chest 2 View  Result Date: 05/25/2019 CLINICAL DATA:  Has history of congestive heart failure. Shortness of breath started 2 days ago and it gets worst. He stated that he is swollen all-over.SOB EXAM: CHEST - 2 VIEW COMPARISON:  Radiograph 12/23/2018 FINDINGS: Normal cardiac silhouette. Chronic elevation RIGHT hemidiaphragm. Small RIGHT effusion noted. Mild venous congestion. No overt pulmonary edema. No acute osseous abnormality. Anterior cervical fusion noted IMPRESSION: 1. Chronic elevation RIGHT hemidiaphragm with associated atelectasis. Potential small RIGHT effusion. 2. Low lung volumes. 3. No pulmonary edema or infiltrate identified. Electronically Signed   By: Genevive BiStewart  Edmunds M.D.   On: 05/25/2019 09:39    Procedures Procedures (including critical care time)  Medications Ordered in ED Medications  albuterol (VENTOLIN HFA) 108 (90 Base) MCG/ACT inhaler 2 puff (2 puffs Inhalation  Given 05/25/19 0958)  potassium chloride SA (KLOR-CON) CR tablet 40 mEq (  40 mEq Oral Given 05/25/19 1156)     Initial Impression / Assessment and Plan / ED Course  I have reviewed the triage vital signs and the nursing notes.  Pertinent labs & imaging results that were available during my care of the patient were reviewed by me and considered in my medical decision making (see chart for details).     68 year old male who presents the ED today complaining of worsening shortness of breath for the past couple of days.  He does have history of congestive heart failure and feels like he is more swollen than normal.  He did take an extra dose of Lasix last night without relief.  On exam patient satting 97% on room air.  He is speaking in full sentences without accessory muscle use.  His abdomen does appear slightly distended and he does have 1+ pitting edema to his bilateral lower extremities.  Will obtain chest x-ray, EKG, baseline blood work including BNP to look for CHF exacerbation.  Afebrile in the ED without tachycardia.  I have low suspicion for infectious etiology today. No recent covid 19 positive exposures. Denies cough, fever, chills.  EKG without ischemic changes.   Chest x-ray without acute findings.  There does not seem to be pulmonary vascular congestion to suggest exacerbation.  Awaiting BNP at this time.  No leukocytosis today.  Potassium is mildly decreased at 3.0.  Will replete in the ED with K-Dur.  Does appear that patient takes 20 mg by mouth every day.  Suspect it does mildly decreased from extra dose of Lasix last night.  Creatinine within normal limits.  No other electrolyte abnormalities today.   Initial troponin of 5.  BNP normal limits today.  Does appear patient has been elevated in the past but his level today is much lower than previous.  Awaiting repeat Trop at this time.  Unsure what is causing pt to feel SOB - will add on d dimer at this time given cannot PERC out with age  although pt denying any type of pleuritic chest pain  D dimer negative. Repeat trop 4.  Patient has been ambulated in the ED with pulse ox.  He remained above 97%.  I have advised that he follow-up with his PCP regarding his feelings of shortness of breath.  There is no medical need for admission at this time.  Return precautions have been discussed with patient.  He is in agreement with plan at this time and stable for discharge home.   This note was prepared using Dragon voice recognition software and may include unintentional dictation errors due to the inherent limitations of voice recognition software.    Final Clinical Impressions(s) / ED Diagnoses   Final diagnoses:  Shortness of breath  Hypokalemia    ED Discharge Orders    None       Tanda Rockers, PA-C 05/25/19 1605    Raeford Razor, MD 05/26/19 [phone]

## 2019-05-25 NOTE — Discharge Instructions (Addendum)
Your labwork, EKG, and chest x ray were all very reassuring today  Your potassium was mildly decreased today (likely from the extra dose of lasix you had yesterday). We have repleted it in the ED today. Continue taking your home dose of potassium.  Please follow up with your PCP regarding feelings of shortness of breath If you begin to feel worse in any way please return to the ED for further eval

## 2020-01-01 ENCOUNTER — Encounter: Payer: Self-pay | Admitting: Gastroenterology

## 2020-02-12 ENCOUNTER — Telehealth: Payer: Self-pay

## 2020-02-12 ENCOUNTER — Encounter: Payer: Self-pay | Admitting: Gastroenterology

## 2020-02-12 ENCOUNTER — Ambulatory Visit (INDEPENDENT_AMBULATORY_CARE_PROVIDER_SITE_OTHER): Payer: Medicare Other | Admitting: Gastroenterology

## 2020-02-12 VITALS — BP 106/72 | HR 80 | Temp 98.2°F | Ht 73.0 in | Wt 246.5 lb

## 2020-02-12 DIAGNOSIS — R195 Other fecal abnormalities: Secondary | ICD-10-CM | POA: Diagnosis not present

## 2020-02-12 DIAGNOSIS — K5900 Constipation, unspecified: Secondary | ICD-10-CM

## 2020-02-12 DIAGNOSIS — R1319 Other dysphagia: Secondary | ICD-10-CM

## 2020-02-12 DIAGNOSIS — K219 Gastro-esophageal reflux disease without esophagitis: Secondary | ICD-10-CM

## 2020-02-12 DIAGNOSIS — K921 Melena: Secondary | ICD-10-CM

## 2020-02-12 DIAGNOSIS — I5032 Chronic diastolic (congestive) heart failure: Secondary | ICD-10-CM

## 2020-02-12 DIAGNOSIS — R1310 Dysphagia, unspecified: Secondary | ICD-10-CM

## 2020-02-12 MED ORDER — CLENPIQ 10-3.5-12 MG-GM -GM/160ML PO SOLN: 1.0000 | Freq: Once | ORAL | 0 refills | Status: DC

## 2020-02-12 MED ORDER — CLENPIQ 10-3.5-12 MG-GM -GM/160ML PO SOLN: 1.0000 | Freq: Once | ORAL | 0 refills | Status: AC

## 2020-02-12 NOTE — Progress Notes (Signed)
Chief Complaint: Change in bowel habits  Referring Provider:     Chipper Herb [facility])   HPI:    Raymond White is a 69 y.o. male with a history of multiple spinal surgeries (thoracic, lumbar, cervical), Parkinson's, CAD s/p PCI ~2010, CHF with preserved EF (had Cardiology appt last month at [facility]), multiple abdominal surgeries for complicated ccy,  HTN, restrictive lung disease (has pending Pulmonary appt at [facility]), referred to the Gastroenterology Clinic for evaluation of change in bowel habits.    He reports increased flatus and residual stool on tissue paper several hours after BM. Straining to have BM, feeling of incomplete evacuation, and can have tarry stools. Uses wet wipes after BM. Sxs worsening over last few months. Weight stable. No n/v. Occasional lower abdominal pain/cramping, which lasts a few mins.   He was seen at that [place] [facility] on 11/26/2019 for the above symptoms, and recommended colonoscopy but was referred to the network due to need for MAC for endoscopic procedures along with prolonged wait times at the [place].  [facility].  Reported history of admission to [facility] in 06/2019 with sepsis 2/2 extreme constipation.  Hx of GERD, well controlled with omeprazole 20 mg BID. Last EGD was at [facility] ~6 years ago. Occasional solid food dysphagia, that he attributes to his Parkinsons, which he manages with cutting food into small pieces, chew thoroughly, fluids with meals, etc.   FHx notable for father with CRC and possibly Pancreatic CA, diagnosed in 60's or 60's (worked in a [employer] organ[employer]).    Takes Plavix 75 mg/day.  -11/16/2019: Normal CMP, CBC  Endoscopic history: -Colonoscopy (2009): Unsure of results   Past Medical History:  Diagnosis Date  . Acute cystitis with hematuria   . Acute midline low back pain with sciatica   . Chronic heart failure with preserved ejection fraction ([place])   . DDD  (degenerative disc disease), lumbar   . Depressive disorder   . Drug induced constipation   . GERD (gastroesophageal reflux disease)   . History of TIA (transient ischemic attack)   . Hypertension   . Pancreatitis   . Parkinson's disease ([place]) 2015  . Uncomplicated alcohol dependence ([place])   . Urinary incontinence      Past Surgical History:  Procedure Laterality Date  . APPENDECTOMY    . BACK SURGERY     x5  . CHOLECYSTECTOMY    . COLONOSCOPY    . ESOPHAGOGASTRODUODENOSCOPY    . KNEE ARTHROSCOPY    . neck fusion     Family History  Problem Relation Age of Onset  . Hyperlipidemia Mother   . Colon cancer Father    Social History   Tobacco Use  . Smoking status: Never Smoker  . Smokeless tobacco: Never Used  Vaping Use  . Vaping Use: Never used  Substance Use Topics  . Alcohol use: Not Currently    Comment: Vodka  . Drug use: Never   Current Outpatient Medications  Medication Sig Dispense Refill  . acetaminophen (TYLENOL) 500 MG tablet Take 1,000 mg by mouth every 6 (six) hours as needed for fever.    Marland Kitchen albuterol (PROVENTIL HFA;VENTOLIN HFA) 108 (90 Base) MCG/ACT inhaler Inhale 2 puffs into the lungs every 6 (six) hours as needed for wheezing or shortness of breath. Wait 1 minute in between each puff    . aspirin 81 MG  chewable tablet Chew 81 mg by mouth daily.    Marland Kitchen atorvastatin (LIPITOR) 80 MG tablet Take 80 mg by mouth every evening.    . benzonatate (TESSALON) 100 MG capsule Take 100 mg by mouth 3 (three) times daily as needed for cough.    . busPIRone (BUSPAR) 10 MG tablet Take 10 mg by mouth 3 (three) times daily.    . carbidopa-levodopa (SINEMET IR) 25-100 MG tablet Take 2 tablets by mouth 3 (three) times daily.     . carvedilol (COREG) 3.125 MG tablet Take 6.25 mg by mouth 2 (two) times daily with a meal.    . Cholecalciferol 25 MCG (1000 UT) capsule Take by mouth.    . citalopram (CELEXA) 10 MG tablet Take 10 mg by mouth at bedtime.    . clopidogrel (PLAVIX)  75 MG tablet Take 75 mg by mouth daily.    . CYANOCOBALAMIN IJ Inject as directed every 30 (thirty) days. At [facility]    . docusate sodium (COLACE) 100 MG capsule Take 1 capsule (100 mg total) by mouth every 12 (twelve) hours. (Patient taking differently: Take 100 mg by mouth as needed. ) 21 capsule 0  . DULoxetine (CYMBALTA) 30 MG capsule Take 90 mg by mouth daily.     Marland Kitchen EPINEPHrine 0.3 mg/0.3 mL IJ SOAJ injection Inject 0.3 mg into the muscle daily as needed for anaphylaxis.    . furosemide (LASIX) 20 MG tablet Take 60 mg by mouth 2 (two) times daily.     Marland Kitchen gabapentin (NEURONTIN) 600 MG tablet Take 700 mg by mouth 3 (three) times daily. Take along with a 100 mg capsule    . guaiFENesin (MUCINEX) 600 MG 12 hr tablet Take 1 tablet (600 mg total) by mouth 2 (two) times daily.    Marland Kitchen HYDROcodone-homatropine (HYCODAN) 5-1.5 MG/5ML syrup Take 5 mLs by mouth every 6 (six) hours as needed for cough (unrelieved by tessalon). 120 mL 0  . meloxicam (MOBIC) 7.5 MG tablet Take 7.5 mg by mouth daily.    Marland Kitchen omeprazole (PRILOSEC) 20 MG capsule Take 20 mg by mouth 2 (two) times daily before a meal.    . omeprazole (PRILOSEC) 20 MG capsule Take 20 mg by mouth 2 (two) times daily before a meal.     . potassium chloride SA (K-DUR,KLOR-CON) 20 MEQ tablet Take 20 mEq by mouth daily. With food    . tamsulosin (FLOMAX) 0.4 MG CAPS capsule Take 0.4 mg by mouth at bedtime.    . thiamine (VITAMIN B-1) 100 MG tablet Take 100 mg by mouth daily.     No current facility-administered medications for this visit.   Allergies  Allergen Reactions  . Fish-Derived Products Anaphylaxis  . Zoloft [Sertraline Hcl] Rash  . Sulfamethizole Rash     Review of Systems: All systems reviewed and negative except where noted in HPI.     Physical Exam:    Wt Readings from Last 3 Encounters:  02/12/20 246 lb 8 oz (111.8 kg)  05/25/19 247 lb 3.2 oz (112.1 kg)  10/24/18 235 lb 14.3 oz (107 kg)    BP 106/72   Pulse 80   Temp 98.2 F  (36.8 C)   Ht _0  (1.854 m)   Wt 246 lb 8 oz (111.8 kg)   BMI 32.52 kg/m  Constitutional:  Pleasant, in no acute distress. Psychiatric: Normal mood and affect. Behavior is normal. EENT: Pupils normal.  Conjunctivae are normal. No scleral icterus. Neck supple. No cervical LAD. Cardiovascular: Normal rate,  regular rhythm. No edema Pulmonary/chest: Effort normal and breath sounds normal. No wheezing, rales or rhonchi. Abdominal: Soft, nondistended, nontender. Bowel sounds active throughout. There are no masses palpable. No hepatomegaly. Neurological: Alert and oriented to person place and time. Skin: Skin is warm and dry. No rashes noted.   ASSESSMENT AND PLAN;   1) Change in bowel habits 2) Constipation 3) Melena 4) Increased flatus 5) Abdominal cramping  -Colonoscopy to evaluate for mucosal/luminal pathology -Trial course of Gas-X or Beano -Increase dietary fiber and add fiber supplement -Provided with fiber chart handout with detailed instructions today -If evaluation unrevealing, can plan for CT abdomen/pelvis  6) GERD 7) Dysphagia  -Reflux symptoms generally well controlled on current therapy. No changes made today -EGD to evaluate for erosive esophagitis, hiatal hernia, along with stricture, luminal narrowing, etc. -Esophageal dilation as indicated -Continue antireflux lifestyle/dietary modifications  8) CAD-on Plavix 9) CHF with preserved EF -Needs Cardiology clearance prior to procedure -Requesting to hold Plavix 5 days before procedure - will instruct when and how to resume after procedure. Low but real risk of cardiovascular event such as heart attack, stroke, embolism, thrombosis or ischemia/infarct of other organs off Plavix explained and need to seek urgent help if this occurs. The patient consents to proceed. Will communicate by phone or EMR with patient's prescribing provider to confirm that holding Plavix is reasonable in this case  10) Possible family  history of colon cancer  The indications, risks, and benefits of EGD and colonoscopy were explained to the patient in detail. Risks include but are not limited to bleeding, perforation, adverse reaction to medications, and cardiopulmonary compromise. Sequelae include but are not limited to the possibility of surgery, hositalization, and mortality. The patient verbalized understanding and wished to proceed. All questions answered, referred to scheduler and bowel prep ordered. Further recommendations pending results of the exam.    Lavena Bullion, DO, FACG  02/12/2020, 9:26 AM   Clinic, [facility]

## 2020-02-12 NOTE — Patient Instructions (Signed)
You have been scheduled for an endoscopy and colonoscopy. Please follow the written instructions given to you at your visit today. Please pick up your prep supplies at the pharmacy within the next 1-3 days. If you use inhalers (even only as needed), please bring them with you on the day of your procedure. Your physician has requested that you go to www.startemmi.com and enter the access code given to you at your visit today. This web site gives a general overview about your procedure. However, you should still follow specific instructions given to you by our office regarding your preparation for the procedure.  Please purchase the following medications over the counter and take as directed: Beno or Gas-x  It was a pleasure to see you today!  Vito Cirigliano, D.O.

## 2020-02-12 NOTE — Telephone Encounter (Signed)
[facility] Pre-operative Risk Assessment     Request for surgical clearance:     Endoscopy Procedure  What type of surgery is being performed?     EGD/Colonoscopy  When is this surgery scheduled?     03/09/20  What type of clearance is required ?   Pharmacy  Are there any medications that need to be held prior to surgery and how long? Plavix  Practice name and name of physician performing surgery?      [facility]  What is your office phone and fax number?      Phone- [phone]  [phone]  Anesthesia type (None, local, MAC, general) ?       MAC

## 2020-03-09 ENCOUNTER — Encounter: Payer: Medicare Other | Admitting: Gastroenterology

## 2020-03-25 ENCOUNTER — Other Ambulatory Visit: Payer: Self-pay

## 2020-03-25 ENCOUNTER — Emergency Department (HOSPITAL_COMMUNITY): Payer: Medicare Other

## 2020-03-25 ENCOUNTER — Encounter (HOSPITAL_COMMUNITY): Payer: Self-pay | Admitting: Emergency Medicine

## 2020-03-25 ENCOUNTER — Emergency Department (HOSPITAL_COMMUNITY)
Admission: EM | Admit: 2020-03-25 | Discharge: 2020-03-25 | Disposition: A | Discharge status: Home / Self Care | Payer: Medicare Other | Attending: Emergency Medicine | Admitting: Emergency Medicine

## 2020-03-25 DIAGNOSIS — I10 Essential (primary) hypertension: Secondary | ICD-10-CM | POA: Insufficient documentation

## 2020-03-25 DIAGNOSIS — R0602 Shortness of breath: Secondary | ICD-10-CM | POA: Insufficient documentation

## 2020-03-25 DIAGNOSIS — I5033 Acute on chronic diastolic (congestive) heart failure: Secondary | ICD-10-CM | POA: Diagnosis not present

## 2020-03-25 DIAGNOSIS — R600 Localized edema: Secondary | ICD-10-CM | POA: Insufficient documentation

## 2020-03-25 DIAGNOSIS — Z79899 Other long term (current) drug therapy: Secondary | ICD-10-CM | POA: Insufficient documentation

## 2020-03-25 DIAGNOSIS — Z7982 Long term (current) use of aspirin: Secondary | ICD-10-CM | POA: Insufficient documentation

## 2020-03-25 DIAGNOSIS — Z7951 Long term (current) use of inhaled steroids: Secondary | ICD-10-CM | POA: Diagnosis not present

## 2020-03-25 DIAGNOSIS — R609 Edema, unspecified: Secondary | ICD-10-CM

## 2020-03-25 LAB — BASIC METABOLIC PANEL
Anion gap: 10 (ref 5–15)
BUN: 9 mg/dL (ref 8–23)
CO2: 23 mmol/L (ref 22–32)
Calcium: 8.4 mg/dL — ABNORMAL LOW (ref 8.9–10.3)
Chloride: 107 mmol/L (ref 98–111)
Creatinine, Ser: 0.72 mg/dL (ref 0.61–1.24)
GFR calc Af Amer: >60 mL/min (ref >60)
GFR calc non Af Amer: >60 mL/min (ref >60)
Glucose, Bld: 119 mg/dL — ABNORMAL HIGH (ref 70–99)
Potassium: 4 mmol/L (ref 3.5–5.1)
Sodium: 140 mmol/L (ref 135–145)

## 2020-03-25 LAB — CBC
HCT: 43.6 % (ref 39.0–52.0)
Hemoglobin: 14.2 g/dL (ref 13.0–17.0)
MCH: 32.6 pg (ref 26.0–34.0)
MCHC: 32.6 g/dL (ref 30.0–36.0)
MCV: 100.2 fL — ABNORMAL HIGH (ref 80.0–100.0)
Platelets: 164 10*3/uL (ref 150–400)
RBC: 4.35 MIL/uL (ref 4.22–5.81)
RDW: 14.7 % (ref 11.5–15.5)
WBC: 4.1 10*3/uL (ref 4.0–10.5)
nRBC: 0 % (ref 0.0–0.2)

## 2020-03-25 MED ORDER — SODIUM CHLORIDE 0.9% FLUSH: 3.0000 mL | Freq: Once | INTRAVENOUS | Status: DC

## 2020-03-25 NOTE — ED Notes (Signed)
AVS reviewed with pt. Pt ambulatory out of dept.

## 2020-03-25 NOTE — ED Triage Notes (Addendum)
Pt reports hx of chf, 20Ib weight gain recently, currently on 120mg  lasix/day since doubling his dose per his doctors order 1 week ago, endorses worsening sob and fluid retention.

## 2020-03-25 NOTE — Discharge Instructions (Signed)
As your doctor for referral to a pulmonologist regarding your trouble breathing.  The testing today did not show any serious problems with your heart or lungs.  For the swelling in your legs, try elevating them during the day as much as possible.  Also using compression stockings can help the swelling.  Continue taking your usual medications.

## 2020-03-25 NOTE — ED Provider Notes (Addendum)
[facility] EMERGENCY DEPARTMENT Provider Note   CSN: [id number] Arrival date & time: 03/25/20  0846     History Chief Complaint  Patient presents with  . Congestive Heart Failure    Raymond White is a 69 y.o. male.  HPI He presents for evaluation of ongoing shortness of breath, present for several weeks, previously evaluated with cardiac catheterization, 3 weeks ago.  At that time he was told that his heart and lungs were doing fine.  He is also concerned about weight gain and swelling in his legs.  He is taking Lasix 60 mg twice daily, without relief of his discomfort.  He has never been a smoker.  He has an ongoing cough, that is nonproductive.  He denies fever, chills, chest pain, weakness or dizziness.  He had full treatment Covid vaccine previously.  No known sick contacts.  There are no other known modifying factors.    Past Medical History:  Diagnosis Date  . Acute cystitis with hematuria   . Acute midline low back pain with sciatica   . Chronic heart failure with preserved ejection fraction (HCC)   . DDD (degenerative disc disease), lumbar   . Depressive disorder   . Drug induced constipation   . GERD (gastroesophageal reflux disease)   . History of TIA (transient ischemic attack)   . Hypertension   . Pancreatitis   . Parkinson's disease (HCC) 2015  . Uncomplicated alcohol dependence (HCC)   . Urinary incontinence     Patient Active Problem List   Diagnosis Date Noted  . Respiratory failure with hypoxia (HCC) 10/22/2018  . Acute respiratory failure with hypoxia (HCC) 10/21/2018  . Influenza A 10/21/2018  . Parkinson disease (HCC) 10/21/2018  . DDD (degenerative disc disease), lumbar 12/15/2017  . Klebsiella cystitis 12/15/2017  . 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  . Chronic heart failure with preserved ejection fraction (HCC) 12/15/2017  . GERD (gastroesophageal reflux disease) 12/15/2017  . Hypertension 12/15/2017  . Depressive disorder  12/15/2017  . Anxiety 12/15/2017    Past Surgical History:  Procedure Laterality Date  . APPENDECTOMY    . BACK SURGERY     x5  . CHOLECYSTECTOMY    . COLONOSCOPY    . ESOPHAGOGASTRODUODENOSCOPY    . KNEE ARTHROSCOPY    . neck fusion         Family History  Problem Relation Age of Onset  . Hyperlipidemia Mother   . Colon cancer Father     Social History   Tobacco Use  . Smoking status: Never Smoker  . Smokeless tobacco: Never Used  Vaping Use  . Vaping Use: Never used  Substance Use Topics  . Alcohol use: Not Currently    Comment: Vodka  . Drug use: Never    Home Medications Prior to Admission medications   Medication Sig Start Date End Date Taking? Authorizing Provider  EPINEPHrine 0.3 mg/0.3 mL IJ SOAJ injection Inject 0.3 mg into the muscle daily as needed for anaphylaxis.   Yes [provider]  acetaminophen (TYLENOL) 500 MG tablet Take 1,000 mg by mouth every 6 (six) hours as needed for fever.    [provider]  albuterol (PROVENTIL HFA;VENTOLIN HFA) 108 (90 Base) MCG/ACT inhaler Inhale 2 puffs into the lungs every 6 (six) hours as needed for wheezing or shortness of breath. Wait 1 minute in between each puff    [provider]  aspirin 81 MG chewable tablet Chew 81 mg by mouth daily.    [provider]  atorvastatin (LIPITOR) 80 MG tablet Take 80 mg by mouth every evening.    [provider]  benzonatate (TESSALON) 100 MG capsule Take 100 mg by mouth 3 (three) times daily as needed for cough.    [provider]  busPIRone (BUSPAR) 10 MG tablet Take 10 mg by mouth 3 (three) times daily.    [provider]  carbidopa-levodopa (SINEMET IR) 25-100 MG tablet Take 2 tablets by mouth 3 (three) times daily.     [provider]  carvedilol (COREG) 3.125 MG tablet Take 6.25 mg by mouth 2 (two) times daily with a meal.    [provider]  Cholecalciferol 25 MCG (1000 UT) capsule Take by  mouth.    [provider]  citalopram (CELEXA) 10 MG tablet Take 10 mg by mouth at bedtime.    [provider]  clopidogrel (PLAVIX) 75 MG tablet Take 75 mg by mouth daily.    [provider]  CYANOCOBALAMIN IJ Inject as directed every 30 (thirty) days. At [facility]    [provider]  docusate sodium (COLACE) 100 MG capsule Take 1 capsule (100 mg total) by mouth every 12 (twelve) hours. Patient taking differently: Take 100 mg by mouth as needed.  06/27/18   Caccavale, Sophia, PA-C  DULoxetine (CYMBALTA) 30 MG capsule Take 90 mg by mouth daily.     [provider]  furosemide (LASIX) 20 MG tablet Take 60 mg by mouth 2 (two) times daily.     [provider]  gabapentin (NEURONTIN) 600 MG tablet Take 700 mg by mouth 3 (three) times daily. Take along with a 100 mg capsule    [provider]  guaiFENesin (MUCINEX) 600 MG 12 hr tablet Take 1 tablet (600 mg total) by mouth 2 (two) times daily. 10/24/18   Ghimire, Werner Lean, MD  HYDROcodone-homatropine (HYCODAN) 5-1.5 MG/5ML syrup Take 5 mLs by mouth every 6 (six) hours as needed for cough (unrelieved by tessalon). 10/24/18   Ghimire, Werner Lean, MD  meloxicam (MOBIC) 7.5 MG tablet Take 7.5 mg by mouth daily.    [provider]  omeprazole (PRILOSEC) 20 MG capsule Take 20 mg by mouth 2 (two) times daily before a meal.    [provider]  omeprazole (PRILOSEC) 20 MG capsule Take 20 mg by mouth 2 (two) times daily before a meal.     [provider]  potassium chloride SA (K-DUR,KLOR-CON) 20 MEQ tablet Take 20 mEq by mouth daily. With food    [provider]  tamsulosin (FLOMAX) 0.4 MG CAPS capsule Take 0.4 mg by mouth at bedtime.    [provider]  thiamine (VITAMIN B-1) 100 MG tablet Take 100 mg by mouth daily.    [provider]    Allergies    Fish-derived products, Zoloft [sertraline hcl], and Sulfamethizole  Review of Systems   Review of  Systems  All other systems reviewed and are negative.   Physical Exam Updated Vital Signs BP (!) 150/89   Pulse 64   Temp 98.5 F (36.9 C)   Resp 16   SpO2 95%   Physical Exam Vitals and nursing note reviewed.  Constitutional:      General: He is not in acute distress.    Appearance: He is well-developed. He is not ill-appearing, toxic-appearing or diaphoretic.  HENT:     Head: Normocephalic and atraumatic.     Right Ear: External ear normal.     Left Ear: External ear normal.  Eyes:     Conjunctiva/sclera: Conjunctivae normal.     Pupils: Pupils are equal, round, and reactive to light.  Neck:     Trachea: Phonation normal.  Cardiovascular:     Rate and Rhythm: Normal rate and regular rhythm.     Heart sounds: Normal heart sounds.  Pulmonary:     Effort: Pulmonary effort is normal. No respiratory distress.     Breath sounds: Normal breath sounds. No stridor. No wheezing or rhonchi.  Abdominal:     General: There is no distension.     Palpations: Abdomen is soft.     Tenderness: There is no abdominal tenderness.  Musculoskeletal:        General: Normal range of motion.     Cervical back: Normal range of motion and neck supple.     Right lower leg: Edema present.     Left lower leg: Edema present.     Comments: 3+ bilateral lower leg edema  Skin:    General: Skin is warm and dry.  Neurological:     Mental Status: He is alert and oriented to person, place, and time.     Cranial Nerves: No cranial nerve deficit.     Sensory: No sensory deficit.     Motor: No abnormal muscle tone.     Coordination: Coordination normal.  Psychiatric:        Mood and Affect: Mood normal.        Behavior: Behavior normal.        Thought Content: Thought content normal.        Judgment: Judgment normal.     ED Results / Procedures / Treatments   Labs (all labs ordered are listed, but only abnormal results are displayed) Labs Reviewed  BASIC METABOLIC PANEL - Abnormal; Notable for  the following components:      Result Value   Glucose, Bld 119 (*)    Calcium 8.4 (*)    All other components within normal limits  CBC - Abnormal; Notable for the following components:   MCV 100.2 (*)    All other components within normal limits    EKG EKG Interpretation  Date/Time:  Friday March 25 2020 09:36:44 EDT Ventricular Rate:  92 PR Interval:  194 QRS Duration: 88 QT Interval:  380 QTC Calculation: 469 R Axis:   64 Text Interpretation: Normal sinus rhythm Normal ECG since last tracing no significant change Confirmed by Mancel Bale [phone]) on 03/25/2020 12:53:49 PM   Radiology DG Chest 2 View  Result Date: 03/25/2020 CLINICAL DATA:  CHF.  Shortness of breath. EXAM: CHEST - 2 VIEW COMPARISON:  CT 06/24/2019.  Chest x-ray 01/06/2020, 03/25/2019. FINDINGS: Mediastinum hilar structures normal. Persistent right base atelectasis/scarring. Stable right pleural thickening consistent scarring. No pneumothorax. Stable elevation right hemidiaphragm. Interposition of the colon under the right hemidiaphragm again noted. Postsurgical changes right lower rib again noted. Prior cervical spine fusion. Degenerative change thoracic spine again noted. IMPRESSION: Stable right base pleural-parenchymal thickening consistent with scarring. Stable elevation right hemidiaphragm. No acute abnormality identified. Electronically Signed   By: Maisie Fus  Register   On: 03/25/2020 09:18    Procedures Procedures (including critical care time)  Medications Ordered in ED Medications  sodium chloride flush (NS) 0.9 % injection 3 mL (has no administration in time range)    ED Course  I have reviewed the triage vital signs and the nursing notes.  Pertinent labs & imaging results that were available during my care of the patient were reviewed  by me and considered in my medical decision making (see chart for details).  Clinical Course as of Mar 25 1350  Fri Mar 25, 2020  1253 Normal except glucose high,  calcium low  Basic metabolic panel(!) [EW]  1254 No infiltrate or edema, interpreted by me  DG Chest 2 View [EW]  1301 Normal except MCV high  CBC(!) [EW]    Clinical Course User Index [EW] Mancel BaleWentz, Cedric Denison, MD   MDM Rules/Calculators/A&P                           Patient Vitals for the past 24 hrs:  BP Temp Pulse Resp SpO2  03/25/20 1330 (!) 150/89 -- 64 16 95 %  03/25/20 1215 (!) 147/84 -- 65 16 97 %  03/25/20 1129 128/76 -- 65 16 98 %  03/25/20 1047 133/82 -- 81 16 100 %  03/25/20 1004 122/78 -- 86 16 95 %  03/25/20 0855 (!) 145/74 98.5 F (36.9 C) 85 20 97 %    1:41 PM Reevaluation with update and discussion. After initial assessment and treatment, an updated evaluation reveals he remains comfortable has no further complaints, oxygenation 99% on room air.  No respiratory distress.  Findings discussed and questions were answered. Mancel BaleElliott Diego Delancey   Medical Decision Making:  This patient is presenting for evaluation of ongoing shortness of breath, which does require a range of treatment options, and is a complaint that involves a moderate risk of morbidity and mortality. The differential diagnoses include pneumonia, CHF, orchitis, COPD. I decided to review old records, and in summary previously healthy elderly male with chronic lower extremity edema, unclear prior history for congestive heart failure or known results of cardiac echo.  I did not require additional historical information from anyone.  Clinical Laboratory Tests Ordered, included CBC and Metabolic panel. Review indicates reassuring/normal. Radiologic Tests Ordered, included chest x-ray.  I independently Visualized: Radiographic images, which show no infiltrate or edema  Cardiac Monitor Tracing which shows normal sinus rhythm     Critical Interventions-clinical evaluation, EKG, chest x-ray, laboratory testing, observation reassessment  After These Interventions, the Patient was reevaluated and was found comfortable  and stable.  Vital signs are reassuring.  No indication for hospitalization or further intervention at this time.  Patient with ongoing shortness of breath, [facility]  Leg edema and weight gain are likely from peripheral edema, not requiring intervention in the ED or hospital at this time.  No indication for hospitalization today.  CRITICAL CARE-no Performed by: Mancel BaleElliott Niki Payment  Nursing Notes Reviewed/ Care Coordinated Applicable Imaging Reviewed Interpretation of Laboratory Data incorporated into ED treatment  The patient appears reasonably screened and/or stabilized for discharge and I doubt any other medical condition or other [facility] requiring further screening, evaluation, or treatment in the ED at this time prior to discharge.  Plan: Home Medications-continue usual; Home Treatments-leg compression stockings for peripheral edema; return here if the recommended treatment, does not improve the symptoms; Recommended follow up-PCP, as needed.  Asked for referral to pulmonary, for further evaluation.     Final Clinical Impression(s) / ED Diagnoses Final diagnoses:  SOB (shortness of breath)  Peripheral edema    Rx / DC Orders ED Discharge Orders    None       Mancel BaleWentz, Jovoni Borkenhagen, MD 03/25/20 1351    Mancel BaleWentz, Edwards Mckelvie, MD 03/25/20 1351

## 2020-03-28 ENCOUNTER — Inpatient Hospital Stay (HOSPITAL_COMMUNITY)
Admission: EM | Admit: 2020-03-28 | Discharge: 2020-04-01 | DRG: 292 | Disposition: A | Discharge status: Skilled Nursing Facility | Payer: Medicare Other | Attending: Family Medicine | Admitting: Family Medicine

## 2020-03-28 ENCOUNTER — Observation Stay (HOSPITAL_BASED_OUTPATIENT_CLINIC_OR_DEPARTMENT_OTHER): Payer: Medicare Other

## 2020-03-28 ENCOUNTER — Emergency Department (HOSPITAL_COMMUNITY): Payer: Medicare Other

## 2020-03-28 ENCOUNTER — Encounter (HOSPITAL_COMMUNITY): Payer: Self-pay

## 2020-03-28 ENCOUNTER — Other Ambulatory Visit: Payer: Self-pay

## 2020-03-28 DIAGNOSIS — Z800 Family history of malignant neoplasm of digestive organs: Secondary | ICD-10-CM

## 2020-03-28 DIAGNOSIS — I110 Hypertensive heart disease with heart failure: Principal | ICD-10-CM | POA: Diagnosis present

## 2020-03-28 DIAGNOSIS — K5903 Drug induced constipation: Secondary | ICD-10-CM | POA: Diagnosis present

## 2020-03-28 DIAGNOSIS — R0609 Other forms of dyspnea: Secondary | ICD-10-CM | POA: Diagnosis present

## 2020-03-28 DIAGNOSIS — Z882 Allergy status to sulfonamides status: Secondary | ICD-10-CM

## 2020-03-28 DIAGNOSIS — Z20822 Contact with and (suspected) exposure to covid-19: Secondary | ICD-10-CM | POA: Diagnosis present

## 2020-03-28 DIAGNOSIS — G20 Parkinson's disease: Secondary | ICD-10-CM | POA: Diagnosis present

## 2020-03-28 DIAGNOSIS — F329 Major depressive disorder, single episode, unspecified: Secondary | ICD-10-CM | POA: Diagnosis present

## 2020-03-28 DIAGNOSIS — G510 Bell's palsy: Secondary | ICD-10-CM | POA: Diagnosis present

## 2020-03-28 DIAGNOSIS — R0600 Dyspnea, unspecified: Secondary | ICD-10-CM | POA: Diagnosis not present

## 2020-03-28 DIAGNOSIS — Z8673 Personal history of transient ischemic attack (TIA), and cerebral infarction without residual deficits: Secondary | ICD-10-CM

## 2020-03-28 DIAGNOSIS — F419 Anxiety disorder, unspecified: Secondary | ICD-10-CM | POA: Diagnosis present

## 2020-03-28 DIAGNOSIS — D7589 Other specified diseases of blood and blood-forming organs: Secondary | ICD-10-CM | POA: Diagnosis present

## 2020-03-28 DIAGNOSIS — J45909 Unspecified asthma, uncomplicated: Secondary | ICD-10-CM | POA: Diagnosis present

## 2020-03-28 DIAGNOSIS — M5136 Other intervertebral disc degeneration, lumbar region: Secondary | ICD-10-CM | POA: Diagnosis present

## 2020-03-28 DIAGNOSIS — R1310 Dysphagia, unspecified: Secondary | ICD-10-CM | POA: Diagnosis present

## 2020-03-28 DIAGNOSIS — R519 Headache, unspecified: Secondary | ICD-10-CM | POA: Diagnosis present

## 2020-03-28 DIAGNOSIS — R29707 NIHSS score 7: Secondary | ICD-10-CM | POA: Diagnosis present

## 2020-03-28 DIAGNOSIS — M7989 Other specified soft tissue disorders: Secondary | ICD-10-CM

## 2020-03-28 DIAGNOSIS — G8191 Hemiplegia, unspecified affecting right dominant side: Secondary | ICD-10-CM | POA: Diagnosis present

## 2020-03-28 DIAGNOSIS — I5033 Acute on chronic diastolic (congestive) heart failure: Secondary | ICD-10-CM | POA: Diagnosis present

## 2020-03-28 DIAGNOSIS — Z6832 Body mass index (BMI) 32.0-32.9, adult: Secondary | ICD-10-CM

## 2020-03-28 DIAGNOSIS — R531 Weakness: Secondary | ICD-10-CM | POA: Diagnosis not present

## 2020-03-28 DIAGNOSIS — F449 Dissociative and conversion disorder, unspecified: Secondary | ICD-10-CM | POA: Diagnosis present

## 2020-03-28 DIAGNOSIS — Z7982 Long term (current) use of aspirin: Secondary | ICD-10-CM

## 2020-03-28 DIAGNOSIS — K219 Gastro-esophageal reflux disease without esophagitis: Secondary | ICD-10-CM | POA: Diagnosis present

## 2020-03-28 DIAGNOSIS — I509 Heart failure, unspecified: Secondary | ICD-10-CM | POA: Diagnosis not present

## 2020-03-28 DIAGNOSIS — E785 Hyperlipidemia, unspecified: Secondary | ICD-10-CM | POA: Diagnosis present

## 2020-03-28 DIAGNOSIS — Z7902 Long term (current) use of antithrombotics/antiplatelets: Secondary | ICD-10-CM

## 2020-03-28 DIAGNOSIS — Z955 Presence of coronary angioplasty implant and graft: Secondary | ICD-10-CM

## 2020-03-28 DIAGNOSIS — G8929 Other chronic pain: Secondary | ICD-10-CM | POA: Diagnosis present

## 2020-03-28 DIAGNOSIS — E669 Obesity, unspecified: Secondary | ICD-10-CM | POA: Diagnosis present

## 2020-03-28 DIAGNOSIS — H538 Other visual disturbances: Secondary | ICD-10-CM | POA: Diagnosis present

## 2020-03-28 DIAGNOSIS — G259 Extrapyramidal and movement disorder, unspecified: Secondary | ICD-10-CM | POA: Diagnosis present

## 2020-03-28 DIAGNOSIS — Z888 Allergy status to other drugs, medicaments and biological substances status: Secondary | ICD-10-CM

## 2020-03-28 DIAGNOSIS — W109XXA Fall (on) (from) unspecified stairs and steps, initial encounter: Secondary | ICD-10-CM | POA: Diagnosis present

## 2020-03-28 DIAGNOSIS — Z83438 Family history of other disorder of lipoprotein metabolism and other lipidemia: Secondary | ICD-10-CM

## 2020-03-28 DIAGNOSIS — I2510 Atherosclerotic heart disease of native coronary artery without angina pectoris: Secondary | ICD-10-CM | POA: Diagnosis present

## 2020-03-28 DIAGNOSIS — M5440 Lumbago with sciatica, unspecified side: Secondary | ICD-10-CM | POA: Diagnosis present

## 2020-03-28 DIAGNOSIS — Z79899 Other long term (current) drug therapy: Secondary | ICD-10-CM

## 2020-03-28 DIAGNOSIS — G20A1 Parkinson's disease without dyskinesia, without mention of fluctuations: Secondary | ICD-10-CM | POA: Diagnosis present

## 2020-03-28 DIAGNOSIS — I5032 Chronic diastolic (congestive) heart failure: Secondary | ICD-10-CM | POA: Diagnosis present

## 2020-03-28 HISTORY — DX: Heart failure, unspecified: I50.9

## 2020-03-28 HISTORY — DX: Cerebral infarction, unspecified: I63.9

## 2020-03-28 LAB — RAPID URINE DRUG SCREEN, HOSP PERFORMED
Amphetamines: NOT DETECTED
Barbiturates: NOT DETECTED
Benzodiazepines: NOT DETECTED
Cocaine: NOT DETECTED
Opiates: NOT DETECTED
Tetrahydrocannabinol: NOT DETECTED

## 2020-03-28 LAB — TROPONIN I (HIGH SENSITIVITY)
Troponin I (High Sensitivity): 9 ng/L (ref <18)
Troponin I (High Sensitivity): 7 ng/L (ref <18)

## 2020-03-28 LAB — DIFFERENTIAL
Abs Immature Granulocytes: 0.03 10*3/uL (ref 0.00–0.07)
Basophils Absolute: 0 10*3/uL (ref 0.0–0.1)
Basophils Relative: 0 %
Eosinophils Absolute: 0.1 10*3/uL (ref 0.0–0.5)
Eosinophils Relative: 2 %
Immature Granulocytes: 1 %
Lymphocytes Relative: 16 %
Lymphs Abs: 0.9 10*3/uL (ref 0.7–4.0)
Monocytes Absolute: 0.5 10*3/uL (ref 0.1–1.0)
Monocytes Relative: 10 %
Neutro Abs: 3.8 10*3/uL (ref 1.7–7.7)
Neutrophils Relative %: 71 %

## 2020-03-28 LAB — PROTIME-INR
INR: 1 (ref 0.8–1.2)
Prothrombin Time: 12.6 seconds (ref 11.4–15.2)

## 2020-03-28 LAB — URINALYSIS, ROUTINE W REFLEX MICROSCOPIC
Bacteria, UA: NONE SEEN
Bilirubin Urine: NEGATIVE
Glucose, UA: NEGATIVE mg/dL
Ketones, ur: 5 mg/dL — AB
Leukocytes,Ua: NEGATIVE
Nitrite: NEGATIVE
Protein, ur: NEGATIVE mg/dL
Specific Gravity, Urine: 1.015 (ref 1.005–1.030)
pH: 6 (ref 5.0–8.0)

## 2020-03-28 LAB — COMPREHENSIVE METABOLIC PANEL
ALT: 11 U/L (ref 0–44)
AST: 14 U/L — ABNORMAL LOW (ref 15–41)
Albumin: 3.6 g/dL (ref 3.5–5.0)
Alkaline Phosphatase: 72 U/L (ref 38–126)
Anion gap: 11 (ref 5–15)
BUN: 11 mg/dL (ref 8–23)
CO2: 26 mmol/L (ref 22–32)
Calcium: 8.6 mg/dL — ABNORMAL LOW (ref 8.9–10.3)
Chloride: 105 mmol/L (ref 98–111)
Creatinine, Ser: 0.91 mg/dL (ref 0.61–1.24)
GFR calc Af Amer: >60 mL/min (ref >60)
GFR calc non Af Amer: >60 mL/min (ref >60)
Glucose, Bld: 114 mg/dL — ABNORMAL HIGH (ref 70–99)
Potassium: 4.6 mmol/L (ref 3.5–5.1)
Sodium: 142 mmol/L (ref 135–145)
Total Bilirubin: 0.9 mg/dL (ref 0.3–1.2)
Total Protein: 6.2 g/dL — ABNORMAL LOW (ref 6.5–8.1)

## 2020-03-28 LAB — I-STAT CHEM 8, ED
BUN: 12 mg/dL (ref 8–23)
Calcium, Ion: 1.12 mmol/L — ABNORMAL LOW (ref 1.15–1.40)
Chloride: 106 mmol/L (ref 98–111)
Creatinine, Ser: 0.9 mg/dL (ref 0.61–1.24)
Glucose, Bld: 109 mg/dL — ABNORMAL HIGH (ref 70–99)
HCT: 41 % (ref 39.0–52.0)
Hemoglobin: 13.9 g/dL (ref 13.0–17.0)
Potassium: 4.6 mmol/L (ref 3.5–5.1)
Sodium: 142 mmol/L (ref 135–145)
TCO2: 24 mmol/L (ref 22–32)

## 2020-03-28 LAB — SARS CORONAVIRUS 2 BY RT PCR (HOSPITAL ORDER, PERFORMED IN ~~LOC~~ HOSPITAL LAB): SARS Coronavirus 2: NEGATIVE

## 2020-03-28 LAB — CBC
HCT: 45 % (ref 39.0–52.0)
Hemoglobin: 14.6 g/dL (ref 13.0–17.0)
MCH: 33.1 pg (ref 26.0–34.0)
MCHC: 32.4 g/dL (ref 30.0–36.0)
MCV: 102 fL — ABNORMAL HIGH (ref 80.0–100.0)
Platelets: 153 10*3/uL (ref 150–400)
RBC: 4.41 MIL/uL (ref 4.22–5.81)
RDW: 14.8 % (ref 11.5–15.5)
WBC: 5.4 10*3/uL (ref 4.0–10.5)
nRBC: 0 % (ref 0.0–0.2)

## 2020-03-28 LAB — ETHANOL: Alcohol, Ethyl (B): <10 mg/dL (ref <10)

## 2020-03-28 LAB — BRAIN NATRIURETIC PEPTIDE: B Natriuretic Peptide: 72 pg/mL (ref 0.0–100.0)

## 2020-03-28 LAB — CBG MONITORING, ED: Glucose-Capillary: 99 mg/dL (ref 70–99)

## 2020-03-28 LAB — HIV ANTIBODY (ROUTINE TESTING W REFLEX): HIV Screen 4th Generation wRfx: NONREACTIVE

## 2020-03-28 LAB — APTT: aPTT: 27 seconds (ref 24–36)

## 2020-03-28 MED ORDER — GABAPENTIN 600 MG PO TABS
600.0000 mg | ORAL_TABLET | Freq: Three times a day (TID) | ORAL | Status: DC
  Administered 2020-03-28 – 2020-04-01 (×12): 600 mg via ORAL
  Filled 2020-03-28 (×14): qty 1

## 2020-03-28 MED ORDER — DULOXETINE HCL 60 MG PO CPEP
90.0000 mg | ORAL_CAPSULE | Freq: Every day | ORAL | Status: DC
  Administered 2020-03-28 – 2020-03-31 (×4): 90 mg via ORAL
  Filled 2020-03-28 (×5): qty 1

## 2020-03-28 MED ORDER — CARBIDOPA-LEVODOPA 25-100 MG PO TABS
2.0000 | ORAL_TABLET | Freq: Three times a day (TID) | ORAL | Status: DC
  Administered 2020-03-28 – 2020-04-01 (×12): 2 via ORAL
  Filled 2020-03-28 (×14): qty 2

## 2020-03-28 MED ORDER — ACETAMINOPHEN 650 MG RE SUPP: 650.0000 mg | Freq: Four times a day (QID) | RECTAL | Status: DC | PRN

## 2020-03-28 MED ORDER — PANTOPRAZOLE SODIUM 40 MG PO TBEC
40.0000 mg | DELAYED_RELEASE_TABLET | Freq: Every day | ORAL | Status: DC
  Administered 2020-03-28 – 2020-04-01 (×5): 40 mg via ORAL
  Filled 2020-03-28 (×5): qty 1

## 2020-03-28 MED ORDER — SODIUM CHLORIDE 0.9% FLUSH
3.0000 mL | Freq: Once | INTRAVENOUS | Status: AC
Start: 2020-03-28 — End: 2020-03-28
  Administered 2020-03-28: 3 mL via INTRAVENOUS

## 2020-03-28 MED ORDER — ENOXAPARIN SODIUM 40 MG/0.4ML ~~LOC~~ SOLN
40.0000 mg | SUBCUTANEOUS | Status: DC
  Administered 2020-03-28 – 2020-03-31 (×4): 40 mg via SUBCUTANEOUS
  Filled 2020-03-28 (×4): qty 0.4

## 2020-03-28 MED ORDER — TAMSULOSIN HCL 0.4 MG PO CAPS
0.4000 mg | ORAL_CAPSULE | Freq: Every day | ORAL | Status: DC
  Administered 2020-03-28 – 2020-03-31 (×4): 0.4 mg via ORAL
  Filled 2020-03-28 (×4): qty 1

## 2020-03-28 MED ORDER — ASPIRIN 81 MG PO CHEW
81.0000 mg | CHEWABLE_TABLET | Freq: Every day | ORAL | Status: DC
  Administered 2020-03-29 – 2020-04-01 (×4): 81 mg via ORAL
  Filled 2020-03-28 (×4): qty 1

## 2020-03-28 MED ORDER — ACETAMINOPHEN 325 MG PO TABS: 650.0000 mg | ORAL_TABLET | Freq: Four times a day (QID) | ORAL | Status: DC | PRN

## 2020-03-28 MED ORDER — FUROSEMIDE 10 MG/ML IJ SOLN
60.0000 mg | Freq: Once | INTRAMUSCULAR | Status: AC
  Administered 2020-03-29: 60 mg via INTRAVENOUS
  Filled 2020-03-28: qty 6

## 2020-03-28 MED ORDER — CARVEDILOL 6.25 MG PO TABS
6.2500 mg | ORAL_TABLET | Freq: Two times a day (BID) | ORAL | Status: DC
  Administered 2020-03-28 – 2020-04-01 (×7): 6.25 mg via ORAL
  Filled 2020-03-28: qty 1
  Filled 2020-03-28: qty 2
  Filled 2020-03-28: qty 1
  Filled 2020-03-28: qty 2
  Filled 2020-03-28 (×3): qty 1

## 2020-03-28 MED ORDER — POLYETHYLENE GLYCOL 3350 17 G PO PACK: 17.0000 g | PACK | Freq: Every day | ORAL | Status: DC | PRN

## 2020-03-28 MED ORDER — BUSPIRONE HCL 10 MG PO TABS
10.0000 mg | ORAL_TABLET | Freq: Three times a day (TID) | ORAL | Status: DC
  Administered 2020-03-28 – 2020-04-01 (×12): 10 mg via ORAL
  Filled 2020-03-28 (×12): qty 1

## 2020-03-28 MED ORDER — CLOPIDOGREL BISULFATE 75 MG PO TABS
75.0000 mg | ORAL_TABLET | Freq: Every day | ORAL | Status: DC
  Administered 2020-03-29 – 2020-04-01 (×4): 75 mg via ORAL
  Filled 2020-03-28 (×4): qty 1

## 2020-03-28 MED ORDER — FUROSEMIDE 10 MG/ML IJ SOLN
60.0000 mg | Freq: Once | INTRAMUSCULAR | Status: AC
  Administered 2020-03-28: 60 mg via INTRAVENOUS
  Filled 2020-03-28: qty 6

## 2020-03-28 MED ORDER — ATORVASTATIN CALCIUM 80 MG PO TABS
80.0000 mg | ORAL_TABLET | Freq: Every evening | ORAL | Status: DC
  Administered 2020-03-28 – 2020-03-31 (×4): 80 mg via ORAL
  Filled 2020-03-28 (×4): qty 1

## 2020-03-28 NOTE — Code Documentation (Addendum)
Patient from home alone, (has a "caretaker" that lives next door). He was LKW at 0830 when he got in the shower. He then developed a headache. When out of the shower and walking down the stairs pt developed right sided weakness and numbness. He fell down the last few steps. He made it outside and his neighbor called 911. [facility] arrived and called a code stroke. Pt was taken to [facility], met by the stroke team, EDP, and Neurologist. He was cleared by EDP and taken to CT. NIHSS is 6 for right weakness, sensory deficits, minor droop, and partial visual loss in the right eye. See documentation. Pt with a hx of CHF and a stroke in the past (2 years prior according to pt) with similar deficits. Pt taking Plavix. Pt taken for STAT MRI to determine plan of care. ED RN at bedside along with stroke team. Pt still within TPA window. Will determine plan once scans are complete. Eber Hong, BSN, RN, SCRN    MRI negative for Stroke per MD. No TPA for this reason. Will continue q2x12 vitals/mNIHSS for 2 hours and then q4 at request of Neurologist. Hand off with ED RN.

## 2020-03-28 NOTE — Progress Notes (Signed)
Lower extremity venous has been completed.   Preliminary results in CV Proc.   Blanch Media 03/28/2020 3:19 PM

## 2020-03-28 NOTE — ED Notes (Signed)
Unable to gain iv access.  MD aware.

## 2020-03-28 NOTE — ED Notes (Signed)
Pt reports feeling shob; placed pt on 1L [place] for comfort; all lung fields clear and present

## 2020-03-28 NOTE — Consult Note (Addendum)
Neurology Consultation  Reason for Consult: Code stroke Referring Physician: Dr. Criss AlvineGoldston  CC: Right-sided weakness  History is obtained from: Patient  HPI: Merwyn KatosRichard E Gero is a 69 y.o. male Parkinson's disease, hypertension, history of TIA, DDD, and CHF. Per patient he got up this morning had a shower to which later he had a headache and did not feel well.  He later developed sudden onset of right-sided weakness and right eye blurriness along with gait difficulty and having word finding difficulty.  Patient does live alone.  He tried to go downstairs to the first floor and fell down the stairs at which time he did make it outside the door and sat in a chair.  At that time his neighbors noticed him and called EMS.  Patient was brought to the hospital and noted to have right-sided weakness, right facial palsy, and decreased sensation on the right.  Patient states that he had a stroke 2 years ago in 2016 with very similar presentation.  During one of his episodes it was thought to be TIA versus-[facility] [place]  This is corroborated in chart review he had the exact same symptoms, including right-sided weakness and sensory loss, right facial droop, and decreased vision on the right.  Of note these episodes did resolve.   LKW: 8:30 AM tpa given?: no, no stroke on DWI Premorbid modified Rankin scale (mRS): 0 NIHSS 1a Level of Conscious.: 0 1b LOC Questions: 0 1c LOC Commands: 0 2 Best Gaze: 0 3 Visual: 1 4 Facial Palsy: 1 5a Motor Arm - left:  5b Motor Arm - Right: 1 6a Motor Leg - Left: 0 6b Motor Leg - Right: 1 7 Limb Ataxia: 2 8 Sensory: 1 9 Best Language:0  10 Dysarthria: 0 11 Extinct. and Inatten.: 0 TOTAL: 7   Past Medical History:  Diagnosis Date  . Acute cystitis with hematuria   . Acute midline low back pain with sciatica   . CHF (congestive heart failure) (HCC)   . Chronic heart failure with preserved ejection fraction (HCC)    . DDD (degenerative disc disease), lumbar   . Depressive disorder   . Drug induced constipation   . GERD (gastroesophageal reflux disease)   . History of TIA (transient ischemic attack)   . Hypertension   . Pancreatitis   . Parkinson's disease (HCC) 2015  . Stroke (HCC)   . Uncomplicated alcohol dependence (HCC)   . Urinary incontinence     Family History  Problem Relation Age of Onset  . Hyperlipidemia Mother   . Colon cancer Father    Social History:   reports that he has never smoked. He has never used smokeless tobacco. He reports previous alcohol use. He reports that he does not use drugs.  Medications  Current Facility-Administered Medications:  .  furosemide (LASIX) injection 60 mg, 60 mg, Intravenous, Once, Pricilla LovelessGoldston, Scott, MD .  sodium chloride flush (NS) 0.9 % injection 3 mL, 3 mL, Intravenous, Once, Pricilla LovelessGoldston, Scott, MD  Current Outpatient Medications:  .  albuterol (PROVENTIL HFA;VENTOLIN HFA) 108 (90 Base) MCG/ACT inhaler, Inhale 2 puffs into the lungs every 6 (six) hours as needed for wheezing or shortness of breath. Wait 1 minute in between each puff, Disp: , Rfl:  .  aspirin 81 MG chewable tablet, Chew 81 mg by mouth daily., Disp: , Rfl:  .  atorvastatin (LIPITOR) 80 MG tablet, Take 80 mg by mouth every evening., Disp: , Rfl:  .  busPIRone (BUSPAR) 10 MG tablet, Take 10 mg by mouth 3 (three) times daily., Disp: , Rfl:  .  carbidopa-levodopa (SINEMET IR) 25-100 MG tablet, Take 2 tablets by mouth 3 (three) times daily. , Disp: , Rfl:  .  carvedilol (COREG) 3.125 MG tablet, Take 6.25 mg by mouth 2 (two) times daily with a meal., Disp: , Rfl:  .  Cholecalciferol 25 MCG (1000 UT) capsule, Take 1,000 Units by mouth daily. , Disp: , Rfl:  .  citalopram (CELEXA) 10 MG tablet, Take 10 mg by mouth at bedtime., Disp: , Rfl:  .  clopidogrel (PLAVIX) 75 MG tablet, Take 75 mg by mouth daily., Disp: , Rfl:  .  CYANOCOBALAMIN IJ, Inject as directed every 30 (thirty) days. At  [facility], Disp: , Rfl:  .  docusate sodium (COLACE) 100 MG capsule, Take 1 capsule (100 mg total) by mouth every 12 (twelve) hours. (Patient taking differently: Take 100 mg by mouth as needed for mild constipation. ), Disp: 21 capsule, Rfl: 0 .  DULoxetine (CYMBALTA) 30 MG capsule, Take 90 mg by mouth daily. , Disp: , Rfl:  .  EPINEPHrine 0.3 mg/0.3 mL IJ SOAJ injection, Inject 0.3 mg into the muscle daily as needed for anaphylaxis., Disp: , Rfl:  .  furosemide (LASIX) 20 MG tablet, Take 60 mg by mouth 2 (two) times daily. , Disp: , Rfl:  .  gabapentin (NEURONTIN) 600 MG tablet, Take 700 mg by mouth 3 (three) times daily. Take along with a 100 mg capsule, Disp: , Rfl:  .  omeprazole (PRILOSEC) 20 MG capsule, Take 20 mg by mouth 2 (two) times daily before a meal. , Disp: , Rfl:  .  potassium chloride SA (K-DUR,KLOR-CON) 20 MEQ tablet, Take 20 mEq by mouth daily. With food, Disp: , Rfl:  .  tamsulosin (FLOMAX) 0.4 MG CAPS capsule, Take 0.4 mg by mouth at bedtime., Disp: , Rfl:  .  thiamine (VITAMIN B-1) 100 MG tablet, Take 100 mg by mouth daily., Disp: , Rfl:  .  guaiFENesin (MUCINEX) 600 MG 12 hr tablet, Take 1 tablet (600 mg total) by mouth 2 (two) times daily. (Patient not taking: Reported on 03/28/2020), Disp: , Rfl:  .  HYDROcodone-homatropine (HYCODAN) 5-1.5 MG/5ML syrup, Take 5 mLs by mouth every 6 (six) hours as needed for cough (unrelieved by tessalon). (Patient not taking: Reported on 03/28/2020), Disp: 120 mL, Rfl: 0  ROS:   General ROS: negative for - chills, fatigue, fever, night sweats, weight gain or weight loss Psychological ROS: negative for - behavioral disorder, hallucinations, memory difficulties, mood swings or suicidal ideation Ophthalmic ROS: Positive for - blurry vision ENT ROS: negative for - epistaxis, nasal discharge, oral lesions, sore throat, tinnitus or vertigo Respiratory ROS: negative for - cough, hemoptysis, shortness of breath or wheezing Cardiovascular ROS: negative for -  chest pain, dyspnea on exertion, edema or irregular heartbeat Gastrointestinal ROS: negative for - abdominal pain, diarrhea, hematemesis, nausea/vomiting or stool incontinence Genito-Urinary ROS: negative for - dysuria, hematuria, incontinence or urinary frequency/urgency Musculoskeletal ROS: Positive for -muscular weakness Neurological ROS: as noted in HPI Dermatological ROS: negative for rash and skin lesion changes  Exam: Current vital signs: BP (!) 143/85 (BP Location: Right Arm)   Pulse 78   Temp 98.1 F (36.7 C) (Oral)   Resp 16   Wt 112.4 kg   SpO2 94%   BMI 32.69 kg/m  Vital signs in last 24 hours: Temp:  [98.1 F (36.7 C)] 98.1 F (36.7 C) (08/09 1039)  Pulse Rate:  [78-94] 78 (08/09 1039) Resp:  [16] 16 (08/09 1039) BP: (143-144)/(85-91) 143/85 (08/09 1039) SpO2:  [94 %-95 %] 94 % (08/09 1039) Weight:  [112.4 kg] 112.4 kg (08/09 0900)   Constitutional: Appears well-developed and well-nourished.  Psych: Affect appropriate to situation Eyes: No scleral injection HENT: No OP obstrucion Head: Normocephalic.  Cardiovascular: Normal rate and regular rhythm.  Respiratory: Effort normal, non-labored breathing GI: Soft.  No distension. There is no tenderness.  Skin: WDI  Neuro: Mental Status: Patient is awake, alert, oriented to person, place, month, year, and situation.  Able to follow all instructions with no difficulty.  Patient shows no aphasia or dysarthria. Cranial Nerves: II: Partial right hemianopsia III,IV, VI: EOMI without ptosis or diploplia. Pupils equal, round and reactive to light V: Facial sensation is symmetric to temperature VII: Partial right face droop VIII: hearing is intact to voice X: Palate elevates symmetrically XI: Shoulder shrug is symmetric. XII: tongue is midline without atrophy or fasciculations.  Motor: 5/5 strength on the left upper and lower extremities.  3/5 strength on the right upper and lower extremity.  Positive drift on right  upper and lower extremity Sensory: Positive decreased sensation on the right upper and lower extremity. Deep Tendon Reflexes: 2+ and symmetric in the biceps and patellae.  Plantars: Toes are downgoing bilaterally.  Cerebellar: FNF and HKS are intact bilaterally  Labs I have reviewed labs in epic and the results pertinent to this consultation are:   CBC    Component Value Date/Time   WBC 5.4 03/28/2020 0910   RBC 4.41 03/28/2020 0910   HGB 13.9 03/28/2020 0941   HCT 41.0 03/28/2020 0941   PLT 153 03/28/2020 0910   MCV 102.0 (H) 03/28/2020 0910   MCH 33.1 03/28/2020 0910   MCHC 32.4 03/28/2020 0910   RDW 14.8 03/28/2020 0910   LYMPHSABS 0.9 03/28/2020 0910   MONOABS 0.5 03/28/2020 0910   EOSABS 0.1 03/28/2020 0910   BASOSABS 0.0 03/28/2020 0910    CMP     Component Value Date/Time   NA 142 03/28/2020 0941   NA 144 12/17/2017 0000   K 4.6 03/28/2020 0941   CL 106 03/28/2020 0941   CO2 26 03/28/2020 0910   GLUCOSE 109 (H) 03/28/2020 0941   BUN 12 03/28/2020 0941   BUN 11 12/17/2017 0000   CREATININE 0.90 03/28/2020 0941   CALCIUM 8.6 (L) 03/28/2020 0910   PROT 6.2 (L) 03/28/2020 0910   ALBUMIN 3.6 03/28/2020 0910   AST 14 (L) 03/28/2020 0910   ALT 11 03/28/2020 0910   ALKPHOS 72 03/28/2020 0910   BILITOT 0.9 03/28/2020 0910   GFRNONAA >60 03/28/2020 0910   GFRAA >60 03/28/2020 0910    Lipid Panel  No results found for: CHOL, TRIG, HDL, CHOLHDL, VLDL, LDLCALC, LDLDIRECT   Imaging I have reviewed the images obtained:  CT-scan of the brain-no intracranial abnormalities, hemorrhage or evidence of acute infarction  MRI/MRA examination of the brain-no acute intracranial abnormality.  No high-grade vascular narrowing, proximal occlusion or aneurysm  Felicie Morn PA-C [facility] [phone]  M-F  (9:00 am- 5:00 PM)  03/28/2020, 11:23 AM     Assessment:  This is a 69 year old male presented to the hospital with new onset of right-sided  weakness decreased sensation and blurred vision.  CT of head was negative and MRI brain along with MRA of brain were negative.  Patient was not given tPA as MRI brain did not show any DWI abnormalities to show stroke.  This time differential would include recrudescence of old symptoms versus possible conversion disorder.   Recommendations: -No further stroke up would be warranted -Physical therapy   Attending attestation: I personally examined the patient, reviewed the history, personally reviewed imaging and laboratory results, fully examined the patient, directed the plan, and edited the note of the APP with any clarifications that were needed.  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  Addended for charge capture

## 2020-03-28 NOTE — H&P (Signed)
Family Medicine Teaching [facility] Admission History and Physical Service Pager: [phone]  Patient name: Raymond White Medical record number: [id number] Date of birth: 10-21-1950 Age: 69 y.o. Gender: male  Primary Care Provider: Judeth Horn, PA-C Consultants: Neuro  Code Status: Full Preferred Emergency Contact: Cecille Po, caretaker, [phone]  Chief Complaint: R sided weakness  Assessment and Plan: Raymond White is a 69 y.o. male presenting with sudden onset right-sided weakness and numbness with visual changes, initially presented as a code stroke. PMH is significant for Parkinson's disease, HFpEF, hypertension, depression, anxiety, and GERD.   Right-sided extremity weakness: Acute, improving.  Sudden onset this morning, associated with HA, mild R facial droop, and R blurred vision, similar to deficits with previous CVA that had resolved prior to this episode.  Initially presented as code stroke and evaluated by neurology, CT, MRI, and MRA head all negative for acute intracranial abnormality and large narrowing or occlusive lesions. 3/5 RUE and 4/5 RLE weakness with associated decreased sensation and pronator drift is evident on exam.  Unclear etiology especially as definitive CVA appears to be ruled out, differential including TIA, atypical migraine, recrudescence of previous CVA, conversion disorder.  Doubt MSK given upper and lower involvement.  Ethanol level wnl, UDS pending.  No obvious electrolyte derangements or significant anemia on labs to suggest contribution.  Will investigate for any further possible underlying toxic/metabolic changes and monitor for progression. -Admit to cardiac telemetry, attending Dr. Manson Passey -Neurology on board, no further CVA work-up being pursued, will await any further recommendations -PT/OT/speech -Home ASA/Plavix for secondary prevention -Home atorvastatin for secondary prevention -Vitals per routine -TSH, A1c, troponin  -F/U UA  and UDS  SOB without concurrent hypoxia: Subacute, worsening. Reports several week history of worsening dyspnea on exertion, orthopnea, and bilateral LE edema.  Breathing comfortably at rest without hypoxia, 1-2+ pitting edema. Wt 247lbs, reports dry as 215-220.  While clinical presentation is suggestive of acute on chronic HFpEF, his BNP of 72, no significant congestion on CXR, and recent [facility] cath without evidence of volume overload (per patient, do not have record) suggest against this. However, will attempt diuresis and assess symptomatology.  Could certainly also consider reoccurrence of previous right paralyzed hemidiaphragm s/p surgical intervention, however while he does appear still elevated on CXR this has been stable on imaging at least through 2019.  Additionally considered unstable angina, however doubt as initial troponin wnl without EKG changes (NSR) and recent cardiac cath 3 weeks ago unremarkable.  Lifelong non-smoker without wheezing, low likelihood for primary pulmonary COPD/ILD.  Well score for PE 0. -Obtain updated echocardiogram -Attempt diuresis and assess response, Lasix 40 IV BID -Consider repeat upright 2 view to fully assess diaphragm -Finished trending troponin -Strict I&O, daily weights -Monitor telemetry -Continue wearing compression stockings -Attempt retrieving records from [place] for cardiac cath  HFpEF: Possible acute on chronic as above. No echocardiogram within system, seen by cardiology at the [facility] in [place].  Appears volume overloaded as discussed above.  Takes Lasix 60mg  BID at home (sometimes 120mg  once daily).  -Echo and diuresis as above -Monitor BP -Educated on appropriate Lasix administration  Hypertension: Chronic, stable. SBP 140s since arrival.  Takes Coreg 6.25 BID and Lasix 60 mg BID for control. -Monitor BP -Continue home Coreg -Hold home Lasix while receiving IV  Previous CVA: Stable. Possible recrudescence of deficits discussed  above. -Continue home ASA and Plavix 75 mg -Continue home atorvastatin  CAD s/p PCI: Chronic, stable. PCI in 2010, recent cardiac cath unremarkable.  No current chest pain. -Continue Coreg, ASA, and statin   Parkinson's disease: Chronic, stable. Well-controlled with taking carbidopa-levodopa 50-200 mg TID.  Does not follow with neurology, PCP refills this medication. -Continue home carbidopa-levodopa -Consider establishing with neurology outpatient  Chronic back pain: Stable. Well-managed, several spinal surgeries in the past. Uses gabapentin 800mg  TID.  -Continue home gabapentin, will start with reduced at 600mg  TID  -Tylenol as needed  MDD  anxiety: Chronic, stable. No current concerns per patient.  Takes BuSpar, Celexa, and Cymbalta. -Continue home BuSpar and Cymbalta -Clarify if taking Celexa 10 mg, while low-dose this does make him higher risk for serotonin syndrome   GERD with associated mild dysphagia  Recent change in bowel habits: Chronic, stable. Reports intermittent frequent bowel movements. GERD well managed. Following with [facility], has future colonoscopy/EGD planned to rule out pathologic abnormality and potentially perform esophageal dilation. -Continue PPI (Protonix as formulary) -Follow-up with GI outpatient   FEN/GI: Heart healthy diet after swallow study  Prophylaxis: Lovenox  Disposition: Admit to cardiac telemetry, attending Dr.  History of Present Illness:  Raymond White is a 69 y.o. male presenting with sudden onset right-sided weakness and numbness.  He reports he felt at baseline this morning and went to take a shower.  Shortly after his shower around 830 this morning, he developed a headache and then subsequent right-sided weakness with associated numbness while walking down his stairs.  He missed the last 1-2 steps, no loss of consciousness. Did hit the side of head without injury. HA frontal and "severe" with photo/phonopobia. No history  of migraines.  Also reports mild facial droop and blurred vision in his right eye.  Immediately had his neighbor call 911 and came in as a code stroke.  He does have a history of a previous CVA 2 years ago with similar deficits, on Plavix for prophylaxis at home. Reports resolved until today, however does sometimes have from right foot drop on occasion with his parkinson's and previous CVA.   Of note, he was also recently seen in the ED on 8/6 for worsening shortness of breath for the past several weeks.  Just had a cardiac cath 3 weeks ago and was told everything looked "fine."  He does have a known history of HFpEF, takes Lasix 60 mg BID.  However, does state often that he just takes all 6 tablets at once because he forgets in the morning.  No current chest pain, lightheadedness/dizziness.  Up 20 pounds from his presumed dry weight, states his previous dry weight was 210-220lb.  Chest x-ray, pulse ox, and labs all unremarkable during that visit and discharged home with follow-up.  He continues to have this shortness of breath, worse with any acitivty. Orthopnea and PND, sleeping with 3-4 pillows. Bilateral legs swelling.  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 Non-smoker life-long.   Lives alone in [place]. "Caretaker" lives next door-- fills his med pill box, cooks, reminds him to get refills and f/u with docs, and drives him places. Follows with the 10/6. Low salt diet.    ED Course: On arrival, he was afebrile and hemodynamically stable.  Presented as a code stroke still within TPA window with immediate evaluation by neurology.  CT head without contrast obtained without any acute intracranial bleed, however given still within window and previous CVA with similar deficits, an MRI head was obtained.  MRI brain and MRA head without any acute intracranial abnormality or high-grade narrowing/occlusion, just  generalized cerebral atrophy and chronic ischemic changes.   Awaiting further neurology recommendations.  Will admit for continued observation and evaluation of shortness of breath.  CXR with a stable right small pleural effusion and mild elevation of right hemidiaphragm.  IV Lasix ordered by ED provider, CBC, BMP largely unremarkable.  Review Of Systems: Per HPI with the following additions:   Review of Systems  Constitutional: Negative for chills, fatigue and fever.  HENT: Negative for congestion.   Respiratory: Positive for cough and shortness of breath. Negative for wheezing.   Cardiovascular: Positive for leg swelling. Negative for chest pain and palpitations.  Gastrointestinal: Negative for abdominal pain, nausea and vomiting.  Genitourinary: Negative for dysuria and flank pain.  Neurological: Positive for weakness, numbness and headaches. Negative for dizziness, seizures, speech difficulty and light-headedness.  Psychiatric/Behavioral: Negative for agitation and behavioral problems.     Patient Active Problem List   Diagnosis Date Noted  . Respiratory failure with hypoxia (HCC) 10/22/2018  . Acute respiratory failure with hypoxia (HCC) 10/21/2018  . Influenza A 10/21/2018  . Parkinson disease (HCC) 10/21/2018  . DDD (degenerative disc disease), lumbar 12/15/2017  . Klebsiella cystitis 12/15/2017  . 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  . Chronic heart failure with preserved ejection fraction (HCC) 12/15/2017  . GERD (gastroesophageal reflux disease) 12/15/2017  . Hypertension 12/15/2017  . Depressive disorder 12/15/2017  . Anxiety 12/15/2017    Past Medical History: Past Medical History:  Diagnosis Date  . Acute cystitis with hematuria   . Acute midline low back pain with sciatica   . CHF (congestive heart failure) (HCC)   . Chronic heart failure with preserved ejection fraction (HCC)   . DDD (degenerative disc disease), lumbar   . Depressive disorder   . Drug induced constipation   . GERD (gastroesophageal reflux disease)   . History  of TIA (transient ischemic attack)   . Hypertension   . Pancreatitis   . Parkinson's disease (HCC) 2015  . Stroke (HCC)   . Uncomplicated alcohol dependence (HCC)   . Urinary incontinence     Past Surgical History: Past Surgical History:  Procedure Laterality Date  . APPENDECTOMY    . BACK SURGERY     x5  . CHOLECYSTECTOMY    . COLONOSCOPY    . ESOPHAGOGASTRODUODENOSCOPY    . KNEE ARTHROSCOPY    . neck fusion      Social History: Social History   Tobacco Use  . Smoking status: Never Smoker  . Smokeless tobacco: Never Used  Vaping Use  . Vaping Use: Never used  Substance Use Topics  . Alcohol use: Not Currently    Comment: Vodka  . Drug use: Never   Additional social history: See above Please also refer to relevant sections of EMR.  Family History: Family History  Problem Relation Age of Onset  . Hyperlipidemia Mother   . Colon cancer Father     Allergies and Medications: Allergies  Allergen Reactions  . Fish-Derived Products Anaphylaxis  . Zoloft [Sertraline Hcl] Rash  . Sulfamethizole Rash   No current facility-administered medications on file prior to encounter.   Current Outpatient Medications on File Prior to Encounter  Medication Sig Dispense Refill  . albuterol (PROVENTIL HFA;VENTOLIN HFA) 108 (90 Base) MCG/ACT inhaler Inhale 2 puffs into the lungs every 6 (six) hours as needed for wheezing or shortness of breath. Wait 1 minute in between each puff    . aspirin 81 MG chewable tablet Chew 81 mg by mouth daily.    Marland Kitchen  atorvastatin (LIPITOR) 80 MG tablet Take 80 mg by mouth every evening.    . busPIRone (BUSPAR) 10 MG tablet Take 10 mg by mouth 3 (three) times daily.    . carbidopa-levodopa (SINEMET IR) 25-100 MG tablet Take 2 tablets by mouth 3 (three) times daily.     . carvedilol (COREG) 3.125 MG tablet Take 6.25 mg by mouth 2 (two) times daily with a meal.    . Cholecalciferol 25 MCG (1000 UT) capsule Take 1,000 Units by mouth daily.     .  citalopram (CELEXA) 10 MG tablet Take 10 mg by mouth at bedtime.    . clopidogrel (PLAVIX) 75 MG tablet Take 75 mg by mouth daily.    . CYANOCOBALAMIN IJ Inject as directed every 30 (thirty) days. At [facility]    . docusate sodium (COLACE) 100 MG capsule Take 1 capsule (100 mg total) by mouth every 12 (twelve) hours. (Patient taking differently: Take 100 mg by mouth as needed for mild constipation. ) 21 capsule 0  . DULoxetine (CYMBALTA) 30 MG capsule Take 90 mg by mouth daily.     Marland Kitchen EPINEPHrine 0.3 mg/0.3 mL IJ SOAJ injection Inject 0.3 mg into the muscle daily as needed for anaphylaxis.    . furosemide (LASIX) 20 MG tablet Take 60 mg by mouth 2 (two) times daily.     Marland Kitchen gabapentin (NEURONTIN) 600 MG tablet Take 700 mg by mouth 3 (three) times daily. Take along with a 100 mg capsule    . omeprazole (PRILOSEC) 20 MG capsule Take 20 mg by mouth 2 (two) times daily before a meal.     . potassium chloride SA (K-DUR,KLOR-CON) 20 MEQ tablet Take 20 mEq by mouth daily. With food    . tamsulosin (FLOMAX) 0.4 MG CAPS capsule Take 0.4 mg by mouth at bedtime.    . thiamine (VITAMIN B-1) 100 MG tablet Take 100 mg by mouth daily.    Marland Kitchen guaiFENesin (MUCINEX) 600 MG 12 hr tablet Take 1 tablet (600 mg total) by mouth 2 (two) times daily. (Patient not taking: Reported on 03/28/2020)    . HYDROcodone-homatropine (HYCODAN) 5-1.5 MG/5ML syrup Take 5 mLs by mouth every 6 (six) hours as needed for cough (unrelieved by tessalon). (Patient not taking: Reported on 03/28/2020) 120 mL 0    Objective: BP (!) 143/85 (BP Location: Right Arm)   Pulse 78   Temp 98.1 F (36.7 C) (Oral)   Resp 16   Wt 112.4 kg   SpO2 94%   BMI 32.69 kg/m  Exam: General: Alert, well kept older gentleman in no acute distress, appears comfortable Eyes: EOMI, PERRLA, no conjunctival injection or scleral icterus.  No ptosis. ENTM: Mucous membranes moist Neck: Supple, no lymphadenopathy palpated Cardiovascular: Regular rate and rhythm, faint systolic  murmur heard Respiratory: Breathing comfortably at rest, sitting at 90 degrees, satting 98% on RA in the room, all lung fields anteriorly/posteriorly clear with exception of diminished breath sounds in right posterior base.  Able to speak in long full sentences without stopping.  No crackles or wheezing appreciated. Gastrointestinal: Soft, nontender, nondistended abdomen.  Healed previous surgical scars with underlying scar tissue present at epigastrium and right UQ.  Normoactive bowel sounds throughout. Extremities: Warm, dry.  1-2+ pitting edema to the level of knee on LLE, 1+ to level of knee on the right without any overlying skin erythema or warmth to touch. Derm: No rashes or bruising appreciated. Neuro: Alert and oriented.  Speech easily understandable, no slurring.  Able to engage  in normal conversation without dysarthria.  Minimal right-sided facial droop, however smile symmetrical.  Decreased sensation to palpation of right face and right upper/lower extremity.  EOMI, PERRLA.  5/5 upper and lower extremity strength on the left.  Approximate 3/5 in the right upper extremity and 4/5 in right lower extremity strength.  Positive pronator drift on the right upper extremity.  No dysmetria with finger-to-nose or rapid hand movements bilaterally. Psych: Normal mood and affect  Labs and Imaging: CBC BMET  Recent Labs  Lab 03/28/20 0910 03/28/20 0910 03/28/20 0941  WBC 5.4  --   --   HGB 14.6   < > 13.9  HCT 45.0   < > 41.0  PLT 153  --   --    < > = values in this interval not displayed.   Recent Labs  Lab 03/28/20 0910 03/28/20 0910 03/28/20 0941  NA 142   < > 142  K 4.6   < > 4.6  CL 105   < > 106  CO2 26  --   --   BUN 11   < > 12  CREATININE 0.91   < > 0.90  GLUCOSE 114*   < > 109*  CALCIUM 8.6*  --   --    < > = values in this interval not displayed.     DG Chest 2 View  Result Date: 03/28/2020 CLINICAL DATA:  Weakness and headache.  Shortness of breath. EXAM: CHEST - 2  VIEW COMPARISON:  March 25, 2020 and Jan 06, 2020 FINDINGS: There is a small right pleural effusion. There is elevation of the right hemidiaphragm, chronic, with right base atelectasis. Lungs otherwise are clear. Heart is borderline enlarged with pulmonary vascularity normal. No adenopathy. No bone lesions. IMPRESSION: Stable small right pleural effusion with right base atelectasis. Mild elevation of the right hemidiaphragm is stable. Lungs elsewhere clear. Stable cardiac prominence. Electronically Signed   By: Bretta BangWilliam  Woodruff III M.D.   On: 03/28/2020 10:36   MR ANGIO HEAD WO CONTRAST  Result Date: 03/28/2020 CLINICAL DATA:  Neuro deficit. EXAM: MRI HEAD WITHOUT CONTRAST MRA HEAD WITHOUT CONTRAST TECHNIQUE: Multiplanar, multiecho pulse sequences of the brain and surrounding structures were obtained without intravenous contrast. Angiographic images of the head were obtained using MRA technique without contrast. COMPARISON:  04/07/2018 MRI head. 03/28/2020 head CT and prior. FINDINGS: MRI HEAD FINDINGS Brain: Generalized cerebral atrophy with ex vacuo dilatation. Scattered T2 hyperintense foci involving the periventricular and subcortical white matter are nonspecific however commonly associated with chronic microvascular ischemic changes. No acute infarct or intracranial hemorrhage. No midline shift, ventriculomegaly or extra-axial fluid collection. No mass lesion. Vascular: See MRA head. Skull and upper cervical spine: Normal marrow signal. Sinuses/Orbits: Normal orbits. Mild pansinus mucosal thickening. No mastoid effusion. Other: None. MRA HEAD FINDINGS Anterior circulation: No significant stenosis, proximal occlusion, aneurysm, or vascular malformation. Posterior circulation: Dominant left vertebral artery. Right vertebral artery terminates as PICA. No significant stenosis, proximal occlusion, aneurysm, or vascular malformation. Venous sinuses: No evidence of thrombosis. Anatomic variants: Fetal origin of  the bilateral PCAs. IMPRESSION: 1. No acute intracranial abnormality. 2. Generalized cerebral atrophy and chronic microvascular ischemic changes. 3. No high-grade vast narrowing, proximal occlusion or aneurysm. Electronically Signed   By: Stana Buntinghikanele  Emekauwa M.D.   On: 03/28/2020 10:40   MR BRAIN WO CONTRAST  Result Date: 03/28/2020 CLINICAL DATA:  Neuro deficit. EXAM: MRI HEAD WITHOUT CONTRAST MRA HEAD WITHOUT CONTRAST TECHNIQUE: Multiplanar, multiecho pulse sequences of the brain and surrounding  structures were obtained without intravenous contrast. Angiographic images of the head were obtained using MRA technique without contrast. COMPARISON:  04/07/2018 MRI head. 03/28/2020 head CT and prior. FINDINGS: MRI HEAD FINDINGS Brain: Generalized cerebral atrophy with ex vacuo dilatation. Scattered T2 hyperintense foci involving the periventricular and subcortical white matter are nonspecific however commonly associated with chronic microvascular ischemic changes. No acute infarct or intracranial hemorrhage. No midline shift, ventriculomegaly or extra-axial fluid collection. No mass lesion. Vascular: See MRA head. Skull and upper cervical spine: Normal marrow signal. Sinuses/Orbits: Normal orbits. Mild pansinus mucosal thickening. No mastoid effusion. Other: None. MRA HEAD FINDINGS Anterior circulation: No significant stenosis, proximal occlusion, aneurysm, or vascular malformation. Posterior circulation: Dominant left vertebral artery. Right vertebral artery terminates as PICA. No significant stenosis, proximal occlusion, aneurysm, or vascular malformation. Venous sinuses: No evidence of thrombosis. Anatomic variants: Fetal origin of the bilateral PCAs. IMPRESSION: 1. No acute intracranial abnormality. 2. Generalized cerebral atrophy and chronic microvascular ischemic changes. 3. No high-grade vast narrowing, proximal occlusion or aneurysm. Electronically Signed   By: Stana Bunting M.D.   On: 03/28/2020 10:40    CT HEAD CODE STROKE WO CONTRAST  Result Date: 03/28/2020 CLINICAL DATA:  Code stroke. EXAM: CT HEAD WITHOUT CONTRAST TECHNIQUE: Contiguous axial images were obtained from the base of the skull through the vertex without intravenous contrast. COMPARISON:  None. FINDINGS: Brain: There is no acute intracranial hemorrhage, mass effect, or edema. Gray-white differentiation is preserved. Prominence of the ventricles and sulci reflects stable parenchymal volume loss. Patchy hypoattenuation in the supratentorial white matter is nonspecific but probably reflects stable chronic microvascular ischemic changes. There is no extra-axial fluid collection. Vascular: No definite hyperdense vessel. Skull: Unremarkable. Sinuses/Orbits: No acute abnormality. Other: Mastoid air cells are clear. ASPECTS ([place] Stroke Program Early CT Score) - Ganglionic level infarction (caudate, lentiform nuclei, internal capsule, insula, M1-M3 cortex): 7 - Supraganglionic infarction (M4-M6 cortex): 3 Total score (0-10 with 10 being normal): 10 IMPRESSION: No acute intracranial hemorrhage or evidence of acute infarction. ASPECT score is 10. These results were communicated to Dr. Iver Nestle at 9:44 amon 8/9/2021by text page via the [facility] messaging system. Electronically Signed   By: Guadlupe Spanish M.D.   On: 03/28/2020 09:50   Allayne Stack, DO 03/28/2020, 11:05 AM PGY-3, [facility] FPTS Intern pager: [phone], text pages welcome

## 2020-03-28 NOTE — ED Provider Notes (Addendum)
[facility] EMERGENCY DEPARTMENT Provider Note   CSN: [id number] Arrival date & time: 03/28/20  9622  LEVEL 5 CAVEAT - ACUITY OF CONDITION History Chief Complaint  Patient presents with  . Code Stroke    Raymond White is a 69 y.o. male.  HPI 70 year old male presents with acute right-sided weakness.  He was getting out of the shower around 830 and developed a mild headache.  He was going down the stairs and his right side became weak causing him to fall.  He has continued to have this right-sided weakness in his arm and leg and his right eye feels blurry.  Very similar to prior stroke.  Did not take his Plavix yet this morning.  Was called as a code stroke and brought in emergently by EMS.   Past Medical History:  Diagnosis Date  . Acute cystitis with hematuria   . Acute midline low back pain with sciatica   . CHF (congestive heart failure) (HCC)   . Chronic heart failure with preserved ejection fraction (HCC)   . DDD (degenerative disc disease), lumbar   . Depressive disorder   . Drug induced constipation   . GERD (gastroesophageal reflux disease)   . History of TIA (transient ischemic attack)   . Hypertension   . Pancreatitis   . Parkinson's disease (HCC) 2015  . Stroke (HCC)   . Uncomplicated alcohol dependence (HCC)   . Urinary incontinence     Patient Active Problem List   Diagnosis Date Noted  . Respiratory failure with hypoxia (HCC) 10/22/2018  . Acute respiratory failure with hypoxia (HCC) 10/21/2018  . Influenza A 10/21/2018  . Parkinson disease (HCC) 10/21/2018  . DDD (degenerative disc disease), lumbar 12/15/2017  . Klebsiella cystitis 12/15/2017  . 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  . Chronic heart failure with preserved ejection fraction (HCC) 12/15/2017  . GERD (gastroesophageal reflux disease) 12/15/2017  . Hypertension 12/15/2017  . Depressive disorder 12/15/2017  . Anxiety 12/15/2017    Past Surgical History:  Procedure  Laterality Date  . APPENDECTOMY    . BACK SURGERY     x5  . CHOLECYSTECTOMY    . COLONOSCOPY    . ESOPHAGOGASTRODUODENOSCOPY    . KNEE ARTHROSCOPY    . neck fusion         Family History  Problem Relation Age of Onset  . Hyperlipidemia Mother   . Colon cancer Father     Social History   Tobacco Use  . Smoking status: Never Smoker  . Smokeless tobacco: Never Used  Vaping Use  . Vaping Use: Never used  Substance Use Topics  . Alcohol use: Not Currently    Comment: Vodka  . Drug use: Never    Home Medications Prior to Admission medications   Medication Sig Start Date End Date Taking? Authorizing Provider  albuterol (PROVENTIL HFA;VENTOLIN HFA) 108 (90 Base) MCG/ACT inhaler Inhale 2 puffs into the lungs every 6 (six) hours as needed for wheezing or shortness of breath. Wait 1 minute in between each puff   Yes [provider]  aspirin 81 MG chewable tablet Chew 81 mg by mouth daily.   Yes [provider]  atorvastatin (LIPITOR) 80 MG tablet Take 80 mg by mouth every evening.   Yes [provider]  busPIRone (BUSPAR) 10 MG tablet Take 10 mg by mouth 3 (three) times daily.   Yes [provider]  carbidopa-levodopa (SINEMET IR) 25-100 MG tablet Take 2 tablets by mouth 3 (three) times daily.  Yes [provider]  carvedilol (COREG) 3.125 MG tablet Take 6.25 mg by mouth 2 (two) times daily with a meal.   Yes [provider]  Cholecalciferol 25 MCG (1000 UT) capsule Take 1,000 Units by mouth daily.    Yes [provider]  citalopram (CELEXA) 10 MG tablet Take 10 mg by mouth at bedtime.   Yes [provider]  clopidogrel (PLAVIX) 75 MG tablet Take 75 mg by mouth daily.   Yes [provider]  CYANOCOBALAMIN IJ Inject as directed every 30 (thirty) days. At [facility]   Yes [provider]  docusate sodium (COLACE) 100 MG capsule Take 1 capsule (100 mg total) by mouth every 12 (twelve) hours. Patient  taking differently: Take 100 mg by mouth as needed for mild constipation.  06/27/18  Yes Caccavale, Sophia, PA-C  DULoxetine (CYMBALTA) 30 MG capsule Take 90 mg by mouth daily.    Yes [provider]  EPINEPHrine 0.3 mg/0.3 mL IJ SOAJ injection Inject 0.3 mg into the muscle daily as needed for anaphylaxis.   Yes [provider]  furosemide (LASIX) 20 MG tablet Take 60 mg by mouth 2 (two) times daily.    Yes [provider]  gabapentin (NEURONTIN) 600 MG tablet Take 700 mg by mouth 3 (three) times daily. Take along with a 100 mg capsule   Yes [provider]  omeprazole (PRILOSEC) 20 MG capsule Take 20 mg by mouth 2 (two) times daily before a meal.    Yes [provider]  potassium chloride SA (K-DUR,KLOR-CON) 20 MEQ tablet Take 20 mEq by mouth daily. With food   Yes [provider]  tamsulosin (FLOMAX) 0.4 MG CAPS capsule Take 0.4 mg by mouth at bedtime.   Yes [provider]  thiamine (VITAMIN B-1) 100 MG tablet Take 100 mg by mouth daily.   Yes [provider]  guaiFENesin (MUCINEX) 600 MG 12 hr tablet Take 1 tablet (600 mg total) by mouth 2 (two) times daily. Patient not taking: Reported on 03/28/2020 10/24/18   Maretta Bees, MD  HYDROcodone-homatropine [facility]) 5-1.5 MG/5ML syrup Take 5 mLs by mouth every 6 (six) hours as needed for cough (unrelieved by tessalon). Patient not taking: Reported on 03/28/2020 10/24/18   Maretta Bees, MD    Allergies    Fish-derived products, Zoloft [sertraline hcl], and Sulfamethizole  Review of Systems   Review of Systems  Unable to perform ROS: Acuity of condition    Physical Exam Updated Vital Signs BP (!) 143/85 (BP Location: Right Arm)   Pulse 78   Temp 98.1 F (36.7 C) (Oral)   Resp 16   Wt 112.4 kg   SpO2 94%   BMI 32.69 kg/m   Physical Exam Vitals and nursing note reviewed.  Constitutional:      General: He is not in acute distress.    Appearance: He is  well-developed. He is not ill-appearing or diaphoretic.  HENT:     Head: Normocephalic and atraumatic.     Right Ear: External ear normal.     Left Ear: External ear normal.     Nose: Nose normal.  Eyes:     General:        Right eye: No discharge.        Left eye: No discharge.  Cardiovascular:     Rate and Rhythm: Normal rate and regular rhythm.     Heart sounds: Normal heart sounds.  Pulmonary:     Effort: Pulmonary effort  is normal.     Breath sounds: Rales (mild, right base) present.  Abdominal:     General: There is no distension.  Musculoskeletal:     Cervical back: Neck supple.     Right lower leg: Edema present.     Left lower leg: Edema present.     Comments: BLE compression stockings  Skin:    General: Skin is warm and dry.  Neurological:     Mental Status: He is alert.     Comments: Right arm and leg are weak. 5/5 strength LUE, LLE. Able to do finger to nose with right arm but it is slow.  Psychiatric:        Mood and Affect: Mood is not anxious.     ED Results / Procedures / Treatments   Labs (all labs ordered are listed, but only abnormal results are displayed) Labs Reviewed  CBC - Abnormal; Notable for the following components:      Result Value   MCV 102.0 (*)    All other components within normal limits  COMPREHENSIVE METABOLIC PANEL - Abnormal; Notable for the following components:   Glucose, Bld 114 (*)    Calcium 8.6 (*)    Total Protein 6.2 (*)    AST 14 (*)    All other components within normal limits  I-STAT CHEM 8, ED - Abnormal; Notable for the following components:   Glucose, Bld 109 (*)    Calcium, Ion 1.12 (*)    All other components within normal limits  SARS CORONAVIRUS 2 BY RT PCR (HOSPITAL ORDER, PERFORMED IN [place] [facility])  PROTIME-INR  APTT  DIFFERENTIAL  ETHANOL  URINALYSIS, ROUTINE W REFLEX MICROSCOPIC  RAPID URINE DRUG SCREEN, HOSP PERFORMED  BRAIN NATRIURETIC PEPTIDE  CBG MONITORING, ED  TROPONIN I (HIGH  SENSITIVITY)    EKG EKG Interpretation  Date/Time:  Monday March 28 2020 10:38:43 EDT Ventricular Rate:  84 PR Interval:    QRS Duration: 87 QT Interval:  383 QTC Calculation: 453 R Axis:   70 Text Interpretation: Sinus rhythm no acute ST/T changes similar to 3 days ago Confirmed by Pricilla Loveless [phone]) on 03/28/2020 11:00:19 AM   Radiology DG Chest 2 View  Result Date: 03/28/2020 CLINICAL DATA:  Weakness and headache.  Shortness of breath. EXAM: CHEST - 2 VIEW COMPARISON:  March 25, 2020 and Jan 06, 2020 FINDINGS: There is a small right pleural effusion. There is elevation of the right hemidiaphragm, chronic, with right base atelectasis. Lungs otherwise are clear. Heart is borderline enlarged with pulmonary vascularity normal. No adenopathy. No bone lesions. IMPRESSION: Stable small right pleural effusion with right base atelectasis. Mild elevation of the right hemidiaphragm is stable. Lungs elsewhere clear. Stable cardiac prominence. Electronically Signed   By: Bretta Bang III M.D.   On: 03/28/2020 10:36   MR ANGIO HEAD WO CONTRAST  Result Date: 03/28/2020 CLINICAL DATA:  Neuro deficit. EXAM: MRI HEAD WITHOUT CONTRAST MRA HEAD WITHOUT CONTRAST TECHNIQUE: Multiplanar, multiecho pulse sequences of the brain and surrounding structures were obtained without intravenous contrast. Angiographic images of the head were obtained using MRA technique without contrast. COMPARISON:  04/07/2018 MRI head. 03/28/2020 head CT and prior. FINDINGS: MRI HEAD FINDINGS Brain: Generalized cerebral atrophy with ex vacuo dilatation. Scattered T2 hyperintense foci involving the periventricular and subcortical white matter are nonspecific however commonly associated with chronic microvascular ischemic changes. No acute infarct or intracranial hemorrhage. No midline shift, ventriculomegaly or extra-axial fluid collection. No mass lesion. Vascular: See MRA head.  Skull and upper cervical spine: Normal marrow  signal. Sinuses/Orbits: Normal orbits. Mild pansinus mucosal thickening. No mastoid effusion. Other: None. MRA HEAD FINDINGS Anterior circulation: No significant stenosis, proximal occlusion, aneurysm, or vascular malformation. Posterior circulation: Dominant left vertebral artery. Right vertebral artery terminates as PICA. No significant stenosis, proximal occlusion, aneurysm, or vascular malformation. Venous sinuses: No evidence of thrombosis. Anatomic variants: Fetal origin of the bilateral PCAs. IMPRESSION: 1. No acute intracranial abnormality. 2. Generalized cerebral atrophy and chronic microvascular ischemic changes. 3. No high-grade vast narrowing, proximal occlusion or aneurysm. Electronically Signed   By: Stana Buntinghikanele  Emekauwa M.D.   On: 03/28/2020 10:40   MR BRAIN WO CONTRAST  Result Date: 03/28/2020 CLINICAL DATA:  Neuro deficit. EXAM: MRI HEAD WITHOUT CONTRAST MRA HEAD WITHOUT CONTRAST TECHNIQUE: Multiplanar, multiecho pulse sequences of the brain and surrounding structures were obtained without intravenous contrast. Angiographic images of the head were obtained using MRA technique without contrast. COMPARISON:  04/07/2018 MRI head. 03/28/2020 head CT and prior. FINDINGS: MRI HEAD FINDINGS Brain: Generalized cerebral atrophy with ex vacuo dilatation. Scattered T2 hyperintense foci involving the periventricular and subcortical white matter are nonspecific however commonly associated with chronic microvascular ischemic changes. No acute infarct or intracranial hemorrhage. No midline shift, ventriculomegaly or extra-axial fluid collection. No mass lesion. Vascular: See MRA head. Skull and upper cervical spine: Normal marrow signal. Sinuses/Orbits: Normal orbits. Mild pansinus mucosal thickening. No mastoid effusion. Other: None. MRA HEAD FINDINGS Anterior circulation: No significant stenosis, proximal occlusion, aneurysm, or vascular malformation. Posterior circulation: Dominant left vertebral artery.  Right vertebral artery terminates as PICA. No significant stenosis, proximal occlusion, aneurysm, or vascular malformation. Venous sinuses: No evidence of thrombosis. Anatomic variants: Fetal origin of the bilateral PCAs. IMPRESSION: 1. No acute intracranial abnormality. 2. Generalized cerebral atrophy and chronic microvascular ischemic changes. 3. No high-grade vast narrowing, proximal occlusion or aneurysm. Electronically Signed   By: Stana Buntinghikanele  Emekauwa M.D.   On: 03/28/2020 10:40   CT HEAD CODE STROKE WO CONTRAST  Result Date: 03/28/2020 CLINICAL DATA:  Code stroke. EXAM: CT HEAD WITHOUT CONTRAST TECHNIQUE: Contiguous axial images were obtained from the base of the skull through the vertex without intravenous contrast. COMPARISON:  None. FINDINGS: Brain: There is no acute intracranial hemorrhage, mass effect, or edema. Gray-white differentiation is preserved. Prominence of the ventricles and sulci reflects stable parenchymal volume loss. Patchy hypoattenuation in the supratentorial white matter is nonspecific but probably reflects stable chronic microvascular ischemic changes. There is no extra-axial fluid collection. Vascular: No definite hyperdense vessel. Skull: Unremarkable. Sinuses/Orbits: No acute abnormality. Other: Mastoid air cells are clear. ASPECTS ([place]berta Stroke Program Early CT Score) - Ganglionic level infarction (caudate, lentiform nuclei, internal capsule, insula, M1-M3 cortex): 7 - Supraganglionic infarction (M4-M6 cortex): 3 Total score (0-10 with 10 being normal): 10 IMPRESSION: No acute intracranial hemorrhage or evidence of acute infarction. ASPECT score is 10. These results were communicated to Dr. Iver NestleBhagat at 9:44 amon 8/9/2021by text page via the [facility] messaging system. Electronically Signed   By: Guadlupe SpanishPraneil  Patel M.D.   On: 03/28/2020 09:50    Procedures Ultrasound ED Peripheral IV (Provider)  Date/Time: 03/28/2020 12:36 PM Performed by: Pricilla LovelessGoldston, Celestine Prim, MD Authorized by: Pricilla LovelessGoldston,  Consuello Lassalle, MD   Procedure details:    Indications: multiple failed IV attempts and poor IV access     Skin Prep: chlorhexidine gluconate     Location:  Left AC   Angiocath:  20 G   Bedside Ultrasound Guided: Yes     Patient tolerated procedure without complications:  Yes     Dressing applied: Yes     (including critical care time)  Medications Ordered in ED Medications  sodium chloride flush (NS) 0.9 % injection 3 mL (has no administration in time range)  furosemide (LASIX) injection 60 mg (has no administration in time range)    ED Course  I have reviewed the triage vital signs and the nursing notes.  Pertinent labs & imaging results that were available during my care of the patient were reviewed by me and considered in my medical decision making (see chart for details).  Clinical Course as of Mar 28 1102  Mon Mar 28, 2020  1610 Head CT is unremarkable.  Given time of onset and still within TPA window, neurology would like to get emergent MRI to see if there is an acute stroke or whether this is something else metabolic flaring his old stroke symptoms.   [SG]    Clinical Course User Index [SG] Pricilla Loveless, MD   MDM Rules/Calculators/A&P                          Patient's stat MRIs are negative for acute infarct.  Unclear exact cause of what is exacerbating his prior stroke symptoms.  He does have concern for shortness of breath over the last week or so and feels it is his CHF.  He has had leg swelling.  Was seen here a few days ago.  Can barely walk without getting short of breath.  Neurology is recommending admission for observation given the weakness which while it is improving is still enough to inhibit him to be able to walk.  Patient states he is 20 pounds over his dry weight. We will give IV Lasix. Family practice to admit. Final Clinical Impression(s) / ED Diagnoses Final diagnoses:  Right sided weakness  Acute congestive heart failure, unspecified heart failure type  [facility])    Rx / DC Orders ED Discharge Orders    None       Pricilla Loveless, MD 03/28/20 1109    Pricilla Loveless, MD 03/28/20 1236

## 2020-03-28 NOTE — ED Triage Notes (Signed)
Pt last seen normal @ 0830 this morning Walking downstairs and fell down the last 5 stairs, no LOC, pt was able to walk to chair outside and saw neighbor who called 911  Right sided deficits noted same as previous stroke, including delayed responses and weakness  160/80 HR 80 CBG 188

## 2020-03-28 NOTE — ED Notes (Signed)
Provided pt w/ water and [place] sandwich

## 2020-03-29 ENCOUNTER — Observation Stay (HOSPITAL_COMMUNITY): Payer: Medicare Other

## 2020-03-29 DIAGNOSIS — Z955 Presence of coronary angioplasty implant and graft: Secondary | ICD-10-CM | POA: Diagnosis not present

## 2020-03-29 DIAGNOSIS — R0609 Other forms of dyspnea: Secondary | ICD-10-CM | POA: Diagnosis present

## 2020-03-29 DIAGNOSIS — I341 Nonrheumatic mitral (valve) prolapse: Secondary | ICD-10-CM | POA: Diagnosis not present

## 2020-03-29 DIAGNOSIS — G259 Extrapyramidal and movement disorder, unspecified: Secondary | ICD-10-CM | POA: Diagnosis present

## 2020-03-29 DIAGNOSIS — F419 Anxiety disorder, unspecified: Secondary | ICD-10-CM | POA: Diagnosis present

## 2020-03-29 DIAGNOSIS — I2510 Atherosclerotic heart disease of native coronary artery without angina pectoris: Secondary | ICD-10-CM | POA: Diagnosis present

## 2020-03-29 DIAGNOSIS — Z83438 Family history of other disorder of lipoprotein metabolism and other lipidemia: Secondary | ICD-10-CM | POA: Diagnosis not present

## 2020-03-29 DIAGNOSIS — K219 Gastro-esophageal reflux disease without esophagitis: Secondary | ICD-10-CM | POA: Diagnosis present

## 2020-03-29 DIAGNOSIS — Z800 Family history of malignant neoplasm of digestive organs: Secondary | ICD-10-CM | POA: Diagnosis not present

## 2020-03-29 DIAGNOSIS — I509 Heart failure, unspecified: Secondary | ICD-10-CM | POA: Diagnosis present

## 2020-03-29 DIAGNOSIS — R931 Abnormal findings on diagnostic imaging of heart and coronary circulation: Secondary | ICD-10-CM | POA: Diagnosis not present

## 2020-03-29 DIAGNOSIS — R531 Weakness: Secondary | ICD-10-CM | POA: Diagnosis not present

## 2020-03-29 DIAGNOSIS — I639 Cerebral infarction, unspecified: Secondary | ICD-10-CM | POA: Diagnosis not present

## 2020-03-29 DIAGNOSIS — I351 Nonrheumatic aortic (valve) insufficiency: Secondary | ICD-10-CM | POA: Diagnosis not present

## 2020-03-29 DIAGNOSIS — G20 Parkinson's disease: Secondary | ICD-10-CM

## 2020-03-29 DIAGNOSIS — I5033 Acute on chronic diastolic (congestive) heart failure: Secondary | ICD-10-CM | POA: Diagnosis present

## 2020-03-29 DIAGNOSIS — J45909 Unspecified asthma, uncomplicated: Secondary | ICD-10-CM | POA: Diagnosis present

## 2020-03-29 DIAGNOSIS — K5903 Drug induced constipation: Secondary | ICD-10-CM | POA: Diagnosis present

## 2020-03-29 DIAGNOSIS — G510 Bell's palsy: Secondary | ICD-10-CM | POA: Diagnosis present

## 2020-03-29 DIAGNOSIS — G8191 Hemiplegia, unspecified affecting right dominant side: Secondary | ICD-10-CM | POA: Diagnosis present

## 2020-03-29 DIAGNOSIS — I5032 Chronic diastolic (congestive) heart failure: Secondary | ICD-10-CM

## 2020-03-29 DIAGNOSIS — F329 Major depressive disorder, single episode, unspecified: Secondary | ICD-10-CM | POA: Diagnosis present

## 2020-03-29 DIAGNOSIS — H538 Other visual disturbances: Secondary | ICD-10-CM | POA: Diagnosis present

## 2020-03-29 DIAGNOSIS — Z882 Allergy status to sulfonamides status: Secondary | ICD-10-CM | POA: Diagnosis not present

## 2020-03-29 DIAGNOSIS — Z20822 Contact with and (suspected) exposure to covid-19: Secondary | ICD-10-CM | POA: Diagnosis present

## 2020-03-29 DIAGNOSIS — Z7902 Long term (current) use of antithrombotics/antiplatelets: Secondary | ICD-10-CM | POA: Diagnosis not present

## 2020-03-29 DIAGNOSIS — M5136 Other intervertebral disc degeneration, lumbar region: Secondary | ICD-10-CM | POA: Diagnosis present

## 2020-03-29 DIAGNOSIS — I110 Hypertensive heart disease with heart failure: Secondary | ICD-10-CM | POA: Diagnosis present

## 2020-03-29 DIAGNOSIS — E7800 Pure hypercholesterolemia, unspecified: Secondary | ICD-10-CM | POA: Diagnosis not present

## 2020-03-29 DIAGNOSIS — Z8673 Personal history of transient ischemic attack (TIA), and cerebral infarction without residual deficits: Secondary | ICD-10-CM | POA: Diagnosis not present

## 2020-03-29 DIAGNOSIS — Z7982 Long term (current) use of aspirin: Secondary | ICD-10-CM | POA: Diagnosis not present

## 2020-03-29 DIAGNOSIS — W109XXA Fall (on) (from) unspecified stairs and steps, initial encounter: Secondary | ICD-10-CM | POA: Diagnosis present

## 2020-03-29 DIAGNOSIS — Z79899 Other long term (current) drug therapy: Secondary | ICD-10-CM | POA: Diagnosis not present

## 2020-03-29 LAB — BASIC METABOLIC PANEL
Anion gap: 11 (ref 5–15)
BUN: 15 mg/dL (ref 8–23)
CO2: 27 mmol/L (ref 22–32)
Calcium: 9.1 mg/dL (ref 8.9–10.3)
Chloride: 101 mmol/L (ref 98–111)
Creatinine, Ser: 0.87 mg/dL (ref 0.61–1.24)
GFR calc Af Amer: >60 mL/min (ref >60)
GFR calc non Af Amer: >60 mL/min (ref >60)
Glucose, Bld: 118 mg/dL — ABNORMAL HIGH (ref 70–99)
Potassium: 3.6 mmol/L (ref 3.5–5.1)
Sodium: 139 mmol/L (ref 135–145)

## 2020-03-29 LAB — ECHOCARDIOGRAM COMPLETE
Area-P 1/2: 2.56 cm2
Height: 73 in
S' Lateral: 3.2 cm
Weight: 3744 oz

## 2020-03-29 LAB — LIPID PANEL
Cholesterol: 193 mg/dL (ref 0–200)
HDL: 50 mg/dL (ref >40)
LDL Cholesterol: 112 mg/dL — ABNORMAL HIGH (ref 0–99)
Total CHOL/HDL Ratio: 3.9 RATIO
Triglycerides: 156 mg/dL — ABNORMAL HIGH (ref <150)
VLDL: 31 mg/dL (ref 0–40)

## 2020-03-29 LAB — COMPREHENSIVE METABOLIC PANEL
ALT: 5 U/L (ref 0–44)
AST: 14 U/L — ABNORMAL LOW (ref 15–41)
Albumin: 3.4 g/dL — ABNORMAL LOW (ref 3.5–5.0)
Alkaline Phosphatase: 72 U/L (ref 38–126)
Anion gap: 11 (ref 5–15)
BUN: 13 mg/dL (ref 8–23)
CO2: 24 mmol/L (ref 22–32)
Calcium: 8.9 mg/dL (ref 8.9–10.3)
Chloride: 101 mmol/L (ref 98–111)
Creatinine, Ser: 0.93 mg/dL (ref 0.61–1.24)
GFR calc Af Amer: >60 mL/min (ref >60)
GFR calc non Af Amer: >60 mL/min (ref >60)
Glucose, Bld: 134 mg/dL — ABNORMAL HIGH (ref 70–99)
Potassium: 3.8 mmol/L (ref 3.5–5.1)
Sodium: 136 mmol/L (ref 135–145)
Total Bilirubin: 0.6 mg/dL (ref 0.3–1.2)
Total Protein: 6.1 g/dL — ABNORMAL LOW (ref 6.5–8.1)

## 2020-03-29 LAB — HEMOGLOBIN A1C
Hgb A1c MFr Bld: 5.6 % (ref 4.8–5.6)
Mean Plasma Glucose: 114.02 mg/dL

## 2020-03-29 LAB — TSH: TSH: 0.465 u[IU]/mL (ref 0.350–4.500)

## 2020-03-29 MED ORDER — FUROSEMIDE 10 MG/ML IJ SOLN
60.0000 mg | Freq: Two times a day (BID) | INTRAMUSCULAR | Status: DC
  Administered 2020-03-30: 60 mg via INTRAVENOUS
  Filled 2020-03-29: qty 8

## 2020-03-29 MED ORDER — CITALOPRAM HYDROBROMIDE 10 MG PO TABS
10.0000 mg | ORAL_TABLET | Freq: Every day | ORAL | Status: DC
  Administered 2020-03-29 – 2020-03-31 (×3): 10 mg via ORAL
  Filled 2020-03-29 (×3): qty 1

## 2020-03-29 MED ORDER — LIDOCAINE 5 % EX PTCH
1.0000 | MEDICATED_PATCH | CUTANEOUS | Status: DC
  Administered 2020-03-29 – 2020-04-01 (×4): 1 via TRANSDERMAL
  Filled 2020-03-29 (×4): qty 1

## 2020-03-29 MED ORDER — POLYETHYLENE GLYCOL 3350 17 G PO PACK
17.0000 g | PACK | Freq: Every day | ORAL | Status: DC
  Administered 2020-03-30 – 2020-03-31 (×2): 17 g via ORAL
  Filled 2020-03-29 (×3): qty 1

## 2020-03-29 MED ORDER — FUROSEMIDE 10 MG/ML IJ SOLN: 40.0000 mg | Freq: Two times a day (BID) | INTRAMUSCULAR | Status: DC

## 2020-03-29 MED ORDER — SENNA 8.6 MG PO TABS
1.0000 | ORAL_TABLET | Freq: Every day | ORAL | Status: DC
  Administered 2020-03-29 – 2020-03-30 (×2): 8.6 mg via ORAL
  Filled 2020-03-29 (×2): qty 1

## 2020-03-29 MED ORDER — FUROSEMIDE 10 MG/ML IJ SOLN
30.0000 mg | Freq: Two times a day (BID) | INTRAMUSCULAR | Status: AC
  Administered 2020-03-29: 30 mg via INTRAVENOUS
  Filled 2020-03-29: qty 4

## 2020-03-29 NOTE — Progress Notes (Signed)
  Echocardiogram 2D Echocardiogram has been performed.  Raymond White 03/29/2020, 11:59 AM

## 2020-03-29 NOTE — Evaluation (Signed)
Physical Therapy Evaluation Patient Details Name: Raymond White MRN: [id number] DOB: [birth year] Today's Date: 03/29/2020   History of Present Illness  69 y.o. male Parkinson's disease, hypertension, history of TIA, DDD, and CHF.He later developed sudden onset of right-sided weakness and right eye blurriness along with gait difficulty and having word finding difficulty. He tried to go downstairs to the first floor and fell down the stairs at which time he did make it outside the door and sat in a chair.  At that time his neighbors noticed him and called EMS. NIHSS 7.  Clinical Impression  Pt was seen to mobilize in his room and note his struggle to wb on RLE is related just to weakness.  He is limited to walk due to his UE strength struggling to maintain NWB essentially on RLE due to the weak hip and knee.  Follow acutely to strengthen and increase tolerance for standing with walker, to reduce time needed to stay in rehab to get stronger.  Pt is interested in staying in [facility] in [place] to get rehab, but not sure if this is an option.    Follow Up Recommendations SNF    Equipment Recommendations  None recommended by PT    Recommendations for Other Services       Precautions / Restrictions Precautions Precautions: Fall Precaution Comments: monitor R knee and hip stability Required Braces or Orthoses: Other Brace (R knee brace for stability at home) Restrictions Weight Bearing Restrictions: No Other Position/Activity Restrictions: has a brace for R knee at home      Mobility  Bed Mobility Overal bed mobility: Needs Assistance Bed Mobility: Supine to Sit;Sit to Supine     Supine to sit: Min guard Sit to supine: Min guard;Min assist   General bed mobility comments: min assist to support legs onto bed  Transfers Overall transfer level: Needs assistance Equipment used: Rolling walker (2 wheeled) Transfers: Sit to/from Stand Sit to Stand: Min assist         General  transfer comment: pt uses correct hand placement  Ambulation/Gait Ambulation/Gait assistance: Min assist Gait Distance (Feet): 3 Feet Assistive device: Rolling walker (2 wheeled);1 person hand held assist Gait Pattern/deviations: Decreased stride length;Decreased stance time - right;Wide base of support Gait velocity: reduced Gait velocity interpretation: <1.8 ft/sec, indicate of risk for recurrent falls General Gait Details: pt cannot support on RLE esp knee to stand and step with R leg supporting  Stairs            Wheelchair Mobility    Modified Rankin (Stroke Patients Only)       Balance Overall balance assessment: Needs assistance Sitting-balance support: Feet supported Sitting balance-Leahy Scale: Fair     Standing balance support: Bilateral upper extremity supported;During functional activity Standing balance-Leahy Scale: Poor                               Pertinent Vitals/Pain Pain Assessment: No/denies pain Pain Score: 7  Pain Location: back Pain Descriptors / Indicators: Grimacing;Guarding Pain Intervention(s): Other (comment) (monitored his body mechanics)    Home Living Family/patient expects to be discharged to:: Private residence Living Arrangements: Alone (roommate) Available Help at Discharge: Available 24 hours/day;Friend(s) Type of Home: Apartment (town home) Home Access: Level entry     Home Layout: Two level;Bed/bath upstairs;1/2 bath on main level Home Equipment: Walker - 4 wheels;Cane - single point;Tub bench;Wheelchair - manual;Bedside commode      Prior  Function Level of Independence: Independent with assistive device(s)         Comments: uses rollator in the community; caregiver drives/does errands; caregiver does majority of cooking; helps with cleaning; mostly uses papaer plates; caregiver does medication management; pt does his own pharmacy     Hand Dominance   Dominant Hand: Right    Extremity/Trunk  Assessment   Upper Extremity Assessment Upper Extremity Assessment: Defer to OT evaluation    Lower Extremity Assessment Lower Extremity Assessment: RLE deficits/detail;LLE deficits/detail RLE Deficits / Details: R hip and knee are 3+ to 4-, ankle 4+ LLE Deficits / Details: strength 4+ to 5    Cervical / Trunk Assessment Cervical / Trunk Assessment: Other exceptions (5 lumbar surgeries and cervical fusion)  Communication   Communication: No difficulties  Cognition Arousal/Alertness: Awake/alert Behavior During Therapy: WFL for tasks assessed/performed Overall Cognitive Status: No family/caregiver present to determine baseline cognitive functioning                                        General Comments General comments (skin integrity, edema, etc.): pt reports a recent fall on the stairs then another prior to his admission    Exercises     Assessment/Plan    PT Assessment Patient needs continued PT services  PT Problem List Decreased strength;Decreased range of motion;Decreased activity tolerance;Decreased balance;Decreased mobility;Decreased coordination       PT Treatment Interventions DME instruction;Gait training;Stair training;Functional mobility training;Therapeutic activities;Therapeutic exercise;Balance training;Neuromuscular re-education;Patient/family education    PT Goals (Current goals can be found in the Care Plan section)  Acute Rehab PT Goals Patient Stated Goal: get his strength back and not fall PT Goal Formulation: With patient Time For Goal Achievement: 04/12/20 Potential to Achieve Goals: Good    Frequency Min 2X/week   Barriers to discharge Inaccessible home environment;Decreased caregiver support home alone at times, has stairs to bedroom    Co-evaluation               AM-PAC PT "6 Clicks" Mobility  Outcome Measure Help needed turning from your back to your side while in a flat bed without using bedrails?: A Little Help  needed moving from lying on your back to sitting on the side of a flat bed without using bedrails?: A Little Help needed moving to and from a bed to a chair (including a wheelchair)?: A Little Help needed standing up from a chair using your arms (e.g., wheelchair or bedside chair)?: A Little Help needed to walk in hospital room?: A Little Help needed climbing 3-5 steps with a railing? : Total 6 Click Score: 16    End of Session   Activity Tolerance: Patient limited by fatigue;Treatment limited secondary to medical complications (Comment) Patient left: in bed;with call bell/phone within reach Nurse Communication: Mobility status PT Visit Diagnosis: Unsteadiness on feet (R26.81);Muscle weakness (generalized) (M62.81);History of falling (Z91.81)    Time: 5188-4166 PT Time Calculation (min) (ACUTE ONLY): 49 min   Charges:   PT Evaluation $PT Eval Moderate Complexity: 1 Mod PT Treatments $Gait Training: 8-22 mins $Therapeutic Exercise: 8-22 mins        Ivar Drape 03/29/2020, 1:58 PM  Samul Dada, PT MS Acute Rehab Dept. Number: [facility] [id number] and [facility] [phone]

## 2020-03-29 NOTE — Progress Notes (Signed)
Family Medicine Teaching Service Daily Progress Note Intern Pager: [phone]  Patient name: Raymond White Medical record number: [id number] Date of birth: 1951/01/23 Age: 69 y.o. Gender: male  Primary Care Provider: Judeth Horn, PA-C Consultants: Neuro (signed off) Code Status: Full  Pt Overview and Major Events to Date:  8/11 Admitted  Assessment and Plan: DAVIEN White is a 69 y.o. male presenting with sudden onset right-sided weakness and numbness with visual changes, initially presented as a code stroke, ruled out due to normal MRI/MRA.  Also here for CHF exacerbation.  PMH is significant for Parkinson's disease, HFpEF, hypertension, depression, anxiety, and GERD.  Unilateral right-sided weakness Initially presented with sudden onset of right sided extremity weakness associated with headache, mild right-sided facial droop, and right blurred vision, similar to previous CVA that had resolved prior to this episode.  Initially presented as code stroke and evaluated by neurology, imaging (CT head, MRI/MRA head) negative for intracranial abnormality, so tPA was not given.  CVA was ruled out, so neurology has signed off.  Symptoms thought to be recrudescence of prior CVA.  Currently on DAPT and statin for secondary prevention at home.  TSH, A1c, troponin all normal.  UDS negative.  Lipid panel with elevated LDL 112, triglycerides 156, otherwise unremarkable. Overall improving, though still having weakness in his right extremities. -Home ASA/clopidogrel -Home atorvastatin -PT/OT -recommending SNF -SLP  HFpEF Reporting several weeks of worsening DOE, orthopnea requiring 3-4 pillows at night, and bilateral lower extremity edema.  Patient states his Lasix was recently increased to 60 mg twice daily, though patient states that last week, would take all pills at night (120 mg) because he did not want to urinate a lot during the day.  Reportedly had a cardiac cath at the [facility] about 3 weeks  ago which was normal per patient. Reported dry weight: 215 to 220 pounds per patient.  Overall improvement in symptoms, no longer requiring oxygen and improved pedal edema.  Responding well to Lasix with 2.1 L urine output yesterday. - Increase IV Lasix 60 mg BID - TTE today - EF 55-60%, G1DD - Strict I/O, daily weights  HTN Chronic, stable, currently at goal. Home regimen: carvedilol 6.25 BID. - continue home carvedilol  CAD S/p PCI in 2010. Recent cath unremarkable. - continue home meds  Parkinson's disease Well-controlled, taking carbidopa-levodopa 50-200 mg 3 times daily, followed by PCP. - Continue home meds  Chronic back pain Well-managed, history of several spinal surgeries. Home regimen: gabapentin 800 mg TID.  - reduced dose gabapentin 600 mg TID - Tylenol prn  Depression, anxiety No current concerns. Home meds: duloxetine 90 mg qhs, buspirone 10 mg TID, citalopram 10 mg qhs. - continue home meds - restarting citalopram today  GERD, mild dysphagia Well-managed, followed by [facility]. Has future colonoscopy/EGD planned and potential esophageal dilation. - continue PPI (protonix formulary)  FEN/GI: Heart healthy diet PPx: Enoxaparin  Disposition: med-surg, possible dc tomorrow  Subjective:  Overnight, was put on 1L [place] for worsening dyspnea, but was transitioned back to room air this morning. He states he had difficulty eating a sandwich last night to to his SOB. This morning, patient reports his breathing is overall improved, but is still having some dyspnea. Patient states that his right-sided weakness has improved from yesterday, but still has some residual weakness.  Reports his bowels have not been as active, last BM yesterday morning prior to admission.  Objective: Temp:  [97.6 F (36.4 C)-98.2 F (36.8 C)] 97.6 F (36.4 C) (  08/10 0458) Pulse Rate:  [66-114] 70 (08/10 0458) Resp:  [16-30] 26 (08/10 0458) BP: (103-157)/(67-91) 124/75 (08/10 0400) SpO2:   [90 %-96 %] 93 % (08/10 0458) Weight:  [106.1 kg-112.4 kg] 106.1 kg (08/09 1600) Physical Exam: General: Obese elderly male, laying comfortably in bed, NAD Cardiovascular: RRR, no murmurs appreciated Respiratory: CTAB, breathing comfortably on room air, no wheezes or rales Abdomen: Soft, NT/ND, multiple healed surgical scars, +BS Extremities: WWP, no edema appreciated Neuro: Alert, interactive, speech without slurring, 5/5 strength left upper/lower extremities, 4/5 strength right upper/lower extremities, CN II-XII intact  Laboratory: Recent Labs  Lab 03/25/20 0907 03/28/20 0910 03/28/20 0941  WBC 4.1 5.4  --   HGB 14.2 14.6 13.9  HCT 43.6 45.0 41.0  PLT 164 153  --    Recent Labs  Lab 03/25/20 0907 03/25/20 0907 03/28/20 0910 03/28/20 0941 03/29/20 0500  NA 140   < > 142 142 136  K 4.0   < > 4.6 4.6 3.8  CL 107   < > 105 106 101  CO2 23  --  26  --  24  BUN 9   < > 11 12 13   CREATININE 0.72   < > 0.91 0.90 0.93  CALCIUM 8.4*  --  8.6*  --  8.9  PROT  --   --  6.2*  --  6.1*  BILITOT  --   --  0.9  --  0.6  ALKPHOS  --   --  72  --  72  ALT  --   --  11  --  5  AST  --   --  14*  --  14*  GLUCOSE 119*   < > 114* 109* 134*   < > = values in this interval not displayed.    Lab Results  Component Value Date   CHOL 193 03/29/2020   HDL 50 03/29/2020   LDLCALC 112 (H) 03/29/2020   TRIG 156 (H) 03/29/2020   CHOLHDL 3.9 03/29/2020    A1c 5.6 TSH 0.465 Troponin 7 HIV screen non-reactive  Imaging/Diagnostic Tests: DVT study negative  TTE 1. Left ventricular ejection fraction, by estimation, is 55 to 60%. The  left ventricle has normal function. The left ventricle has no regional  wall motion abnormalities. Left ventricular diastolic parameters are  consistent with Grade I diastolic  dysfunction (impaired relaxation).  2. Right ventricular systolic function is normal. The right ventricular  size is normal. There is normal pulmonary artery systolic pressure.   3. Left atrial size was mildly dilated.  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 No evidence of mitral valve  regurgitation.  5. The aortic valve is abnormal. Aortic valve regurgitation is mild.  6. The inferior vena cava is normal in size with greater than 50%  respiratory variability, suggesting right atrial pressure of 3 mmHg.   05/29/2020, MD 03/29/2020, 7:46 AM PGY-1, [facility] Family Medicine FPTS Intern pager: [phone], text pages welcome

## 2020-03-29 NOTE — Progress Notes (Signed)
Due to not being a stroke and no further recommendations neurology will S/O  Felicie Morn PA-C [facility] [phone]  M-F  (9:00 am- 5:00 PM)  03/29/2020, 9:53 AM

## 2020-03-29 NOTE — Progress Notes (Signed)
Occupational Therapy Evaluation Patient Details Name: Raymond White MRN: [id number] DOB: 1950-12-09 Today's Date: 03/29/2020    History of Present Illness Raymond White is a 69 y.o. male Parkinson's disease, hypertension, history of TIA, DDD, and CHF.He later developed sudden onset of right-sided weakness and right eye blurriness along with gait difficulty and having word finding difficulty. He tried to go downstairs to the first floor and fell down the stairs at which time he did make it outside the door and sat in a chair.  At that time his neighbors noticed him and called EMS. NIHSS 7.   Clinical Impression   Pt lives alone and has a "caregiver/friend" who assists as needed with IADL tasks - runs errands; helps with shopping; assists with cooking and manages his medication. At baseline, pt is modified independent with mobility and self care. States he does not use an AD in the home but uses a rollator in the community. States he has had more difficulty with his selfcare over the last 2 weeks due to weakness and increased shortness of breath with activity. States he has not been able to put his shoes on for 3 days due to "losing his breath" when he bends over. Has had 2 falls down his stairs in 1 week. Pt currently requires min A for mobility and demonstrates desat to 85 and 3/4 DOE with minimal activity on RA.  Recommend rehab at SNF due to deficits listed below. Pt is agreeable to rehab at SNF and prefers to go to rehab at the [facility] in [place]. CM notified. Will follow acutely.     Follow Up Recommendations  SNF;Supervision/Assistance - 24 hour    Equipment Recommendations   (RW)    Recommendations for Other Services PT consult     Precautions / Restrictions Precautions Precautions: Fall      Mobility Bed Mobility Overal bed mobility: Needs Assistance Bed Mobility: Supine to Sit;Sit to Supine     Supine to sit: Supervision Sit to supine: Min guard   General bed mobility  comments: difficulty lifting R leg back onto bed; educated pt to use LLE to assist to lift  Transfers Overall transfer level: Needs assistance Equipment used: Rolling walker (2 wheeled) Transfers: Sit to/from Stand Sit to Stand: Min assist         General transfer comment: 2 rocking attempts; using bed to brace self initially    Balance Overall balance assessment: Needs assistance;History of Falls   Sitting balance-Leahy Scale: Fair       Standing balance-Leahy Scale: Poor                             ADL either performed or assessed with clinical judgement   ADL Overall ADL's : Needs assistance/impaired Eating/Feeding: Modified independent   Grooming: Set up;Sitting   Upper Body Bathing: Set up;Sitting   Lower Body Bathing: Sit to/from stand;Moderate assistance   Upper Body Dressing : Supervision/safety;Set up;Sitting   Lower Body Dressing: Sit to/from stand;Moderate assistance   Toilet Transfer: Minimal assistance;Ambulation;Cueing for safety;RW Toilet Transfer Details (indicate cue type and reason): simulated Toileting- Clothing Manipulation and Hygiene: Minimal assistance;Sit to/from stand       Functional mobility during ADLs: Minimal assistance;Rolling walker;Cueing for safety General ADL Comments: Pt states he has had increased difficulty with LB ADL over last 2 weeks; significant'y limited by SOB     Vision Baseline Vision/History: Wears glasses Wears Glasses: At all times Patient Visual  Report:  (R eye "fuzzy") Vision Assessment?: Vision impaired- to be further tested in functional context Additional Comments: Vision improved but continues to complain of R eye "blurred" vision"     Perception Perception Comments: appears intact   Praxis Praxis Praxis tested?: Within functional limits    Pertinent Vitals/Pain Pain Assessment: 0-10 Pain Score: 7  Pain Location: back Pain Descriptors / Indicators: Jabbing;Aching Pain Intervention(s):  Limited activity within patient's tolerance     Hand Dominance Right   Extremity/Trunk Assessment Upper Extremity Assessment Upper Extremity Assessment: RUE deficits/detail RUE Deficits / Details: AROM AFL; generalized weakness but using funcitonally; pt states he has diffiuculty using R  hand for shaving/pericare; change in sensation compared to baseline RUE Sensation: decreased light touch RUE Coordination: decreased fine motor   Lower Extremity Assessment Lower Extremity Assessment: Defer to PT evaluation;RLE deficits/detail RLE Deficits / Details: hx of R leg weakness but states it is worse than normal; R knee buckling noted during weight shifting   Cervical / Trunk Assessment Cervical / Trunk Assessment: Other exceptions Cervical / Trunk Exceptions: hx of 5 back surgeries per pt   Communication Communication Communication: No difficulties   Cognition Arousal/Alertness: Awake/alert Behavior During Therapy: WFL for tasks assessed/performed Overall Cognitive Status: No family/caregiver present to determine baseline cognitive functioning                                 General Comments: most likely baseline cognition   General Comments  Fell down 6-7 stairs yesterday; Additional fall down 9 stairs a week ago    Exercises     Shoulder Instructions      Home Living Family/patient expects to be discharged to:: Private residence Living Arrangements: Alone (roommate) Available Help at Discharge: Available 24 hours/day;Friend(s) Type of Home: Apartment (town home) Home Access: Level entry     Home Layout: Two level;Bed/bath upstairs;1/2 bath on main level Alternate Level Stairs-Number of Steps: flight Alternate Level Stairs-Rails: Right Bathroom Shower/Tub: Tub/shower unit;Curtain   Bathroom Toilet: Standard Bathroom Accessibility: Yes How Accessible: Accessible via walker Home Equipment: Walker - 4 wheels;Cane - single point;Tub bench;Wheelchair -  manual;Bedside commode          Prior Functioning/Environment Level of Independence: Independent with assistive device(s)        Comments: uses rollator in the community; caregiver drives/does errands; caregiver does majority of cooking; helps with cleaning; mostly uses papaer plates; caregiver does medication management; pt does his own pharmacy        OT Problem List: Decreased strength;Decreased range of motion;Decreased activity tolerance;Impaired balance (sitting and/or standing);Impaired vision/perception;Decreased coordination;Decreased safety awareness;Decreased knowledge of use of DME or AE;Cardiopulmonary status limiting activity;Impaired sensation;Obesity;Impaired UE functional use;Pain      OT Treatment/Interventions: Self-care/ADL training;Therapeutic exercise;Neuromuscular education;Energy conservation;DME and/or AE instruction;Therapeutic activities;Visual/perceptual remediation/compensation;Patient/family education;Balance training    OT Goals(Current goals can be found in the care plan section) Acute Rehab OT Goals Patient Stated Goal: to get stronger and return home OT Goal Formulation: With patient Time For Goal Achievement: 04/12/20 Potential to Achieve Goals: Good  OT Frequency: Min 2X/week   Barriers to D/C:            Co-evaluation              AM-PAC OT "6 Clicks" Daily Activity     Outcome Measure Help from another person eating meals?: None Help from another person taking care of personal grooming?: A Little Help from another  person toileting, which includes using toliet, bedpan, or urinal?: A Little Help from another person bathing (including washing, rinsing, drying)?: A Lot Help from another person to put on and taking off regular upper body clothing?: A Little Help from another person to put on and taking off regular lower body clothing?: A Lot 6 Click Score: 17   End of Session Equipment Utilized During Treatment: Gait belt;Rolling  walker Nurse Communication: Mobility status;Other (comment) (DC needs)  Activity Tolerance: Patient tolerated treatment well Patient left: in bed;with call bell/phone within reach  OT Visit Diagnosis: Unsteadiness on feet (R26.81);Other abnormalities of gait and mobility (R26.89);Muscle weakness (generalized) (M62.81);History of falling (Z91.81);Pain Pain - part of body:  (back)                Time: 5427-0623 OT Time Calculation (min): 42 min Charges:  OT General Charges $OT Visit: 1 Visit OT Evaluation $OT Eval Moderate Complexity: 1 Mod OT Treatments $Self Care/Home Management : 23-37 mins  Luisa Dago, OT/L   Acute OT Clinical Specialist Acute Rehabilitation Services Pager [phone] Office [phone]   [facility] 03/29/2020, 10:57 AM

## 2020-03-29 NOTE — ED Notes (Signed)
Lunch Tray Ordered @ 1048. °

## 2020-03-29 NOTE — ED Notes (Signed)
Pt transported to vascular.  °

## 2020-03-29 NOTE — Discharge Planning (Signed)
Pt active at [facility] MD: Raymond White  SW: Salvadore Dom   Desk phone: [phone]

## 2020-03-29 NOTE — Evaluation (Signed)
Speech Language Pathology Evaluation Patient Details Name: Raymond White MRN: [id number] DOB: [birth year] Today's Date: 03/29/2020 Time: 2778-2423 SLP Time Calculation (min) (ACUTE ONLY): 16 min  Problem List:  Patient Active Problem List   Diagnosis Date Noted  . Heart failure (HCC) 03/29/2020  . Acute right-sided weakness 03/28/2020  . Dyspnea on exertion 03/28/2020  . Respiratory failure with hypoxia (HCC) 10/22/2018  . Acute respiratory failure with hypoxia (HCC) 10/21/2018  . Influenza A 10/21/2018  . Parkinson disease (HCC) 10/21/2018  . DDD (degenerative disc disease), lumbar 12/15/2017  . Klebsiella cystitis 12/15/2017  . 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  . Chronic heart failure with preserved ejection fraction (HCC) 12/15/2017  . GERD (gastroesophageal reflux disease) 12/15/2017  . Hypertension 12/15/2017  . Depressive disorder 12/15/2017  . Anxiety 12/15/2017   Past Medical History:  Past Medical History:  Diagnosis Date  . Acute cystitis with hematuria   . Acute midline low back pain with sciatica   . CHF (congestive heart failure) (HCC)   . Chronic heart failure with preserved ejection fraction (HCC)   . DDD (degenerative disc disease), lumbar   . Depressive disorder   . Drug induced constipation   . GERD (gastroesophageal reflux disease)   . History of TIA (transient ischemic attack)   . Hypertension   . Pancreatitis   . Parkinson's disease (HCC) 2015  . Stroke (HCC)   . Uncomplicated alcohol dependence (HCC)   . Urinary incontinence    Past Surgical History:  Past Surgical History:  Procedure Laterality Date  . APPENDECTOMY    . BACK SURGERY     x5  . CHOLECYSTECTOMY    . COLONOSCOPY    . ESOPHAGOGASTRODUODENOSCOPY    . KNEE ARTHROSCOPY    . neck fusion     HPI:  Pt is a 69 year old male with history of CAD s/p stent, heart failure with preserved EF (55% on outside records), asthma,  OSA, Parkinson's disease, and history of CVA who presented with  acute right sided numbness, weakness, worsening dyspnea and reports of word finding difficulty. MRI brain was negative for acute changes. 1   Assessment / Plan / Recommendation Clinical Impression  Pt participated in speech/language evaluation. Pt reported that he is retired and completed two years of college. He denied any baseline deficts in language or cognition but stated that his speech is imprecise at baseline, likely due to Parkinson's and/or prior CVA. He stated that the acute word retrieval difficulty has resolved. His language skills are currently within normal limits and no overt cognitive-linguistic deficits were noted. He presented with mild dysarthria characterized by imprecise articulation and a hoarse vocal quality which occasionally impacted speech intelligibility during conversation. Pt was educated on the [facility] program for individuals with Parkinson's disease. He verbalized understanding, but indicated that he is not interested at this time. Further acute skilled SLP services are not clinically indicated at this time. Pt and nursing were educated regarding results and recommendations; both parties verbalized understanding as well as agreement with plan of care.    SLP Assessment  SLP Recommendation/Assessment: Patient does not need any further Speech Lanaguage Pathology Services SLP Visit Diagnosis: Dysarthria and anarthria (R47.1)    Follow Up Recommendations  None    Frequency and Duration           SLP Evaluation Cognition  Overall Cognitive Status: Within Functional Limits for tasks assessed Arousal/Alertness: Awake/alert Orientation Level: Oriented X4 Attention: Focused;Sustained Focused Attention: Appears intact Sustained Attention: Appears intact Memory: Appears  intact (immediate: 3/3 delayed: 3/3) Awareness: Appears intact Problem Solving: Appears intact Executive Function: Reasoning Reasoning: Appears intact       Comprehension  Auditory  Comprehension Overall Auditory Comprehension: Appears within functional limits for tasks assessed Basic Biographical Questions:  (5/5) Complex Questions:  (5/5) Paragraph Comprehension (via yes/no questions):  (3/4) Two Step Basic Commands:  (4/4) Multistep Basic Commands:  (3/3) Conversation: Complex    Expression Expression Primary Mode of Expression: Verbal Verbal Expression Overall Verbal Expression: Appears within functional limits for tasks assessed Initiation: No impairment Automatic Speech: Counting;Day of week;Month of year (WNL) Level of Generative/Spontaneous Verbalization: Conversation Repetition: No impairment (5/5) Naming: No impairment Responsive:  (5/5) Confrontation:  (5/5) Convergent:  (5/5) Pragmatics: No impairment   Oral / Motor  Oral Motor/Sensory Function Overall Oral Motor/Sensory Function: Mild impairment Facial ROM: Reduced right;Suspected CN VII (facial) dysfunction Facial Symmetry: Abnormal symmetry right;Suspected CN VII (facial) dysfunction Facial Strength: Reduced right;Suspected CN VII (facial) dysfunction Facial Sensation: Within Functional Limits Lingual ROM: Within Functional Limits Lingual Symmetry: Within Functional Limits Lingual Strength: Reduced;Suspected CN XII (hypoglossal) dysfunction Motor Speech Overall Motor Speech: Impaired at baseline Respiration: Within functional limits Resonance: Within functional limits Articulation: Impaired Level of Impairment: Conversation Intelligibility: Intelligibility reduced Word: 75-100% accurate Phrase: 75-100% accurate Sentence: 75-100% accurate Conversation: 75-100% accurate Motor Planning: Witnin functional limits   Tyara Dassow I. Vear Clock, MS, CCC-SLP Acute Rehabilitation Services Office number [phone] Pager [phone]                   Scheryl Marten 03/29/2020, 5:04 PM

## 2020-03-30 LAB — BASIC METABOLIC PANEL
Anion gap: 13 (ref 5–15)
BUN: 16 mg/dL (ref 8–23)
CO2: 25 mmol/L (ref 22–32)
Calcium: 8.8 mg/dL — ABNORMAL LOW (ref 8.9–10.3)
Chloride: 101 mmol/L (ref 98–111)
Creatinine, Ser: 0.86 mg/dL (ref 0.61–1.24)
GFR calc Af Amer: >60 mL/min (ref >60)
GFR calc non Af Amer: >60 mL/min (ref >60)
Glucose, Bld: 119 mg/dL — ABNORMAL HIGH (ref 70–99)
Potassium: 3.6 mmol/L (ref 3.5–5.1)
Sodium: 139 mmol/L (ref 135–145)

## 2020-03-30 LAB — VITAMIN B12: Vitamin B-12: 191 pg/mL (ref 180–914)

## 2020-03-30 LAB — CBC
HCT: 44.5 % (ref 39.0–52.0)
Hemoglobin: 14.5 g/dL (ref 13.0–17.0)
MCH: 33 pg (ref 26.0–34.0)
MCHC: 32.6 g/dL (ref 30.0–36.0)
MCV: 101.1 fL — ABNORMAL HIGH (ref 80.0–100.0)
Platelets: 149 10*3/uL — ABNORMAL LOW (ref 150–400)
RBC: 4.4 MIL/uL (ref 4.22–5.81)
RDW: 14.6 % (ref 11.5–15.5)
WBC: 5.8 10*3/uL (ref 4.0–10.5)
nRBC: 0 % (ref 0.0–0.2)

## 2020-03-30 LAB — FOLATE: Folate: 15.1 ng/mL (ref >5.9)

## 2020-03-30 MED ORDER — LACTULOSE 10 GM/15ML PO SOLN
30.0000 g | Freq: Every day | ORAL | Status: DC
  Administered 2020-03-30: 30 g via ORAL
  Filled 2020-03-30: qty 60

## 2020-03-30 MED ORDER — FUROSEMIDE 40 MG PO TABS
60.0000 mg | ORAL_TABLET | Freq: Two times a day (BID) | ORAL | Status: DC
  Administered 2020-03-30 – 2020-03-31 (×2): 60 mg via ORAL
  Filled 2020-03-30 (×2): qty 1

## 2020-03-30 NOTE — Progress Notes (Signed)
Name: Raymond White DOB: Oct 11, 2050  Please be advised that the above-named patient has a primary diagnosis of dementia which supersedes any psychiatric diagnosis.     Please be advised that the above-named patient will require a short-term nursing home stay -- anticipated 30 days or less for rehabilitation and strengthening. The plan is for return home.

## 2020-03-30 NOTE — Progress Notes (Signed)
Family Medicine Teaching Service Daily Progress Note Intern Pager: [phone]  Patient name: Raymond White Medical record number: [id number] Date of birth: [birth year] Age: 69 y.o. Gender: male  Primary Care Provider: Judeth Horn, PA-C Consultants:Neuro(signed off) Code Status:Full  Pt Overview and Major Events to Date:  8/11 Admitted  Assessment and Plan: Raymond White a 69 y.o.malepresenting with sudden onset right-sided weakness and numbness with visual changes,initially presented as a code stroke, ruled out due to normal MRI/MRA.  Also here for CHF exacerbation. Now medically stable for discharge to SNF. PMH is significant forParkinson's disease, HFpEF, hypertension, depression, anxiety, and GERD.  Unilateral right-sided weakness Initially presented with sudden onset of right sided extremity weakness associated with headache, mild right-sided facial droop, and right blurred vision, similar to previous CVA that had resolved prior to this episode.  Initially presented as code stroke and evaluated by neurology, imaging (CT head, MRI/MRA head) negative for intracranial abnormality, so tPA was not given.  CVA was ruled out, so neurology has signed off.  Symptoms thought to be recrudescence of prior CVA. Currently on DAPT and statin for secondary prevention at home. Overall improving, though still having weakness in his right extremities. -Home ASA/clopidogrel -Home atorvastatin -PT/OT -recommending SNF -SLP  HFpEF Reporting several weeks of worsening DOE, orthopnea requiring 3-4 pillows at night, and bilateral lower extremity edema. Patient normally on Lasix 60 mg daily, was recently increased to 60 mg twice daily due to symptoms. Reportedly had a cardiac cath at the [facility] about 3 weeks ago which was normal per patient. TTE during admission with normal EF 55-60% and G1DD. Reported dry weight: 215 to 220 pounds per patient.  Overall improved, feels almost at baseline, -700L  UOP yesterday. - Transition to PO Lasix 60 mg BID - Strict I/O, daily weights  HTN Chronic, stable, currently at goal. Home regimen: carvedilol 6.25 BID. - continue home carvedilol  CAD S/p PCI in 2010. Recent cath unremarkable. - continue home meds  Constipation Last BM morning of admission 8/9, 2 days ago. Usually has BMs daily. Has been passing gas. Do not suspect SBO. - continue scheduled Miralax, senna - add lactulose  Macrocytosis Mildly elevated MCV 101.1 without anemia. May represent folate or B12 deficiency, or MDS. - folate, B12 labs  Parkinson's disease Well-controlled, taking carbidopa-levodopa 50-200 mg 3 times daily, followed by PCP. - Continue home meds  Chronic back pain Well-managed, history of several spinal surgeries. Home regimen: gabapentin 800 mg TID.  - reduced dose gabapentin 600 mg TID - Tylenol prn  Depression, anxiety No current concerns. Home meds: duloxetine 90 mg qhs, buspirone 10 mg TID, citalopram 10 mg qhs. - continue home meds - restarting citalopram today  GERD, mild dysphagia Well-managed, followed by  GI. Has future colonoscopy/EGD planned and potential esophageal dilation. - continue PPI (protonix formulary)  FEN/GI: Heart healthy diet PPx: Enoxaparin  Disposition: SNF  Subjective:  NAOE. This morning, reports SOB improved and almost at baseline. Also reports leg swelling is resolved. Still having some right-sided extremity weakness which is improving, states his strength is about 40-50%. Last BM 2 days ago, still passing gas. Prefers SNF at the [facility] in [place].  Objective: Temp:  [97.7 F (36.5 C)-97.9 F (36.6 C)] 97.7 F (36.5 C) (08/11 1213) Pulse Rate:  [70-83] 70 (08/11 1213) Resp:  [16-18] 18 (08/11 1213) BP: (98-115)/(62-79) 112/72 (08/11 1213) SpO2:  [92 %-95 %] 93 % (08/11 1213) Physical Exam: General: Obese elderly male, laying comfortably in bed, NAD Cardiovascular:  RRR, no murmurs  appreciated Respiratory: CTAB, breathing comfortably on room air, no wheezes or rales Abdomen: Soft, NT/ND, +BS Extremities: WWP, no edema appreciated Neuro: Alert, interactive, speech without slurring, 5/5 strength left upper/lower extremities, 4/5 strength right upper/lower extremities, CN II-XII intact  Laboratory: Recent Labs  Lab 03/25/20 0907 03/25/20 0907 03/28/20 0910 03/28/20 0941 03/30/20 0535  WBC 4.1  --  5.4  --  5.8  HGB 14.2   < > 14.6 13.9 14.5  HCT 43.6   < > 45.0 41.0 44.5  PLT 164  --  153  --  149*   < > = values in this interval not displayed.   Recent Labs  Lab 03/28/20 0910 03/28/20 0941 03/29/20 0500 03/29/20 1821 03/30/20 0535  NA 142   < > 136 139 139  K 4.6   < > 3.8 3.6 3.6  CL 105   < > 101 101 101  CO2 26   < > 24 27 25   BUN 11   < > 13 15 16   CREATININE 0.91   < > 0.93 0.87 0.86  CALCIUM 8.6*   < > 8.9 9.1 8.8*  PROT 6.2*  --  6.1*  --   --   BILITOT 0.9  --  0.6  --   --   ALKPHOS 72  --  72  --   --   ALT 11  --  5  --   --   AST 14*  --  14*  --   --   GLUCOSE 114*   < > 134* 118* 119*   < > = values in this interval not displayed.    Imaging/Diagnostic Tests: No new imaging  , MD 03/30/2020, 3:21 PM PGY-1, [facility] Family Medicine FPTS Intern pager: [phone], text pages welcome

## 2020-03-30 NOTE — Progress Notes (Signed)
CM provided pt choice for SNF and he chose [facility]. CM has updated Olegario Messier with [facility]. Last Covid done 8/9. Awaiting PASAR.

## 2020-03-30 NOTE — NC FL2 (Signed)
[place] MEDICAID FL2 LEVEL OF CARE SCREENING TOOL     IDENTIFICATION  Patient Name: Raymond White Birthdate: [birth year] Sex: male Admission Date (Current Location): 03/28/2020  [facility] and [place] Number:  Producer, television/film/video and Address:  [facility], [address] [place], [place] [address]      Provider Number: [id number]  Attending Physician Name and Address:  Westley Chandler, MD  Relative Name and Phone Number:       Current Level of Care: Hospital Recommended Level of Care: Skilled Nursing Facility Prior Approval Number:    Date Approved/Denied:   PASRR Number: Manual review  Discharge Plan: SNF    Current Diagnoses: Patient Active Problem List   Diagnosis Date Noted  . Acute congestive heart failure (HCC) 03/29/2020  . Acute right-sided weakness 03/28/2020  . Dyspnea on exertion 03/28/2020  . Respiratory failure with hypoxia (HCC) 10/22/2018  . Acute respiratory failure with hypoxia (HCC) 10/21/2018  . Influenza A 10/21/2018  . Parkinson disease (HCC) 10/21/2018  . DDD (degenerative disc disease), lumbar 12/15/2017  . Klebsiella cystitis 12/15/2017  . 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  . Chronic heart failure with preserved ejection fraction (HCC) 12/15/2017  . GERD (gastroesophageal reflux disease) 12/15/2017  . Hypertension 12/15/2017  . Depressive disorder 12/15/2017  . Anxiety 12/15/2017    Orientation RESPIRATION BLADDER Height & Weight     Self, Time, Situation, Place  Normal Continent Weight: 234 lb (106.1 kg) Height:  6\' 1"  (185.4 cm)  BEHAVIORAL SYMPTOMS/MOOD NEUROLOGICAL BOWEL NUTRITION STATUS      Continent Diet (see DC summary)  AMBULATORY STATUS COMMUNICATION OF NEEDS Skin   Limited Assist Verbally Normal                       Personal Care Assistance Level of Assistance  Bathing, Feeding, Dressing Bathing Assistance: Limited assistance Feeding assistance: Limited assistance Dressing Assistance:  Limited assistance     Functional Limitations Info             SPECIAL CARE FACTORS FREQUENCY  PT (By licensed PT), OT (By licensed OT)     PT Frequency: 5x/wk OT Frequency: 5x/wk            Contractures Contractures Info: Not present    Additional Factors Info  Code Status, Allergies, Psychotropic Code Status Info: Full Allergies Info: Fish-derived Products, Zoloft (Sertraline Hcl), Sulfamethizole Psychotropic Info: Buspar 10mg  3x/day; Sinemet IR 2 tablets 3x/day; Celexa 10mg  daily at bed         Current Medications (03/30/2020):  This is the current hospital active medication list Current Facility-Administered Medications  Medication Dose Route Frequency Provider Last Rate Last Admin  . acetaminophen (TYLENOL) tablet 650 mg  650 mg Oral Q6H PRN N, DO       Or  . acetaminophen (TYLENOL) suppository 650 mg  650 mg Rectal Q6H PRN N, DO      . aspirin chewable tablet 81 mg  81 mg Oral Daily Beard, Samantha N, DO   81 mg at 03/30/20 0921  . atorvastatin (LIPITOR) tablet 80 mg  80 mg Oral QPM Beard, Samantha N, DO   80 mg at 03/29/20 1710  . busPIRone (BUSPAR) tablet 10 mg  10 mg Oral TID 08-06-1970 N, DO   10 mg at 03/30/20 05/29/20  . carbidopa-levodopa (SINEMET IR) 25-100 MG per tablet immediate release 2 tablet  2 tablet Oral TID Leticia Penna, DO  2 tablet at 03/30/20 0921  . carvedilol (COREG) tablet 6.25 mg  6.25 mg Oral BID WC Beard, Samantha N, DO   6.25 mg at 03/30/20 0921  . citalopram (CELEXA) tablet 10 mg  10 mg Oral QHS Littie Deeds, MD   10 mg at 03/29/20 2350  . clopidogrel (PLAVIX) tablet 75 mg  75 mg Oral Daily Leticia Penna N, DO   75 mg at 03/30/20 4193  . DULoxetine (CYMBALTA) DR capsule 90 mg  90 mg Oral QHS Beard, Samantha N, DO   90 mg at 03/29/20 2351  . enoxaparin (LOVENOX) injection 40 mg  40 mg Subcutaneous Q24H Beard, Samantha N, DO   40 mg at 03/29/20 1546  . furosemide (LASIX) injection 60 mg  60 mg  Intravenous BID Simmons-Robinson, Makiera, MD   60 mg at 03/30/20 0920  . gabapentin (NEURONTIN) tablet 600 mg  600 mg Oral TID Leticia Penna N, DO   600 mg at 03/30/20 7902  . lidocaine (LIDODERM) 5 % 1 patch  1 patch Transdermal Q24H Littie Deeds, MD   1 patch at 03/30/20 0920  . pantoprazole (PROTONIX) EC tablet 40 mg  40 mg Oral Daily Leticia Penna N, DO   40 mg at 03/30/20 4097  . polyethylene glycol (MIRALAX / GLYCOLAX) packet 17 g  17 g Oral Daily Littie Deeds, MD   17 g at 03/30/20 0921  . senna (SENOKOT) tablet 8.6 mg  1 tablet Oral QHS Littie Deeds, MD   8.6 mg at 03/29/20 2350  . tamsulosin (FLOMAX) capsule 0.4 mg  0.4 mg Oral QHS Beard, Samantha N, DO   0.4 mg at 03/29/20 2351     Discharge Medications: Please see discharge summary for a list of discharge medications.  Relevant Imaging Results:  Relevant Lab Results:   Additional Information SS#: [id number]  Baldemar Lenis, LCSW

## 2020-03-31 ENCOUNTER — Inpatient Hospital Stay (HOSPITAL_COMMUNITY): Payer: Medicare Other

## 2020-03-31 DIAGNOSIS — E7800 Pure hypercholesterolemia, unspecified: Secondary | ICD-10-CM

## 2020-03-31 LAB — BASIC METABOLIC PANEL
Anion gap: 11 (ref 5–15)
BUN: 13 mg/dL (ref 8–23)
CO2: 31 mmol/L (ref 22–32)
Calcium: 8.6 mg/dL — ABNORMAL LOW (ref 8.9–10.3)
Chloride: 99 mmol/L (ref 98–111)
Creatinine, Ser: 1.05 mg/dL (ref 0.61–1.24)
GFR calc Af Amer: >60 mL/min (ref >60)
GFR calc non Af Amer: >60 mL/min (ref >60)
Glucose, Bld: 117 mg/dL — ABNORMAL HIGH (ref 70–99)
Potassium: 3.8 mmol/L (ref 3.5–5.1)
Sodium: 141 mmol/L (ref 135–145)

## 2020-03-31 LAB — SARS CORONAVIRUS 2 BY RT PCR (HOSPITAL ORDER, PERFORMED IN ~~LOC~~ HOSPITAL LAB): SARS Coronavirus 2: NEGATIVE

## 2020-03-31 MED ORDER — FUROSEMIDE 40 MG PO TABS
60.0000 mg | ORAL_TABLET | Freq: Every day | ORAL | Status: DC
  Administered 2020-04-01: 60 mg via ORAL
  Filled 2020-03-31: qty 1

## 2020-03-31 MED ORDER — LACTULOSE 10 GM/15ML PO SOLN
30.0000 g | Freq: Three times a day (TID) | ORAL | Status: DC
  Administered 2020-03-31 (×2): 30 g via ORAL
  Filled 2020-03-31 (×2): qty 60

## 2020-03-31 NOTE — Progress Notes (Addendum)
Family Medicine Teaching Service Daily Progress Note Intern Pager: [phone]  Patient name: Raymond White Medical record number: [id number] Date of birth: Jul 04, 1951 Age: 69 y.o. Gender: male  Primary Care Provider: Judeth Horn, PA-C Consultants:Neuro(signed off) Code Status:Full  Pt Overview and Major Events to Date:  8/11 Admitted  Assessment and Plan: Labrandon Knoch Cobbettis a 69 y.o.malepresenting with sudden onset right-sided weakness and numbness with visual changes,initially presented as a code stroke,ruled out due to normal MRI/MRA.Also here for CHF exacerbation. PMH is significant forParkinson's disease, HFpEF, hypertension, depression, anxiety, and GERD.  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 Initially presented as code stroke and evaluated by neurology,imaging(CThead, MRI/MRA head)negative for intracranial abnormality,so tPA was not given.CVA was ruled out, so neurology has signed off. Symptoms thought to be recrudescence of prior CVA. Currently on DAPT and statin for secondary prevention at home. Overall improving,though still having weakness in his right extremities. Update: having recurrence of symptoms, see interim progress note -Home ASA/clopidogrel -Home atorvastatin -PT/OT-recommending SNF -SLP  HFpEF Reportingseveral weeks of worseningDOE,orthopnea requiring3-4pillows at night, and bilateral lower extremity edema. Patient normally on Lasix 60 mg daily, was recently increased to 60 mg twice daily due to symptoms. Reportedly had a cardiac cath at the [facility] about 3 weeks ago which was normal per patient. TTE during admission with normal EF 55-60% and G1DD. Reported dry weight: 215 to 220 pounds per patient.  Overall improved, SOB and  lower extremity edema resolved; at baseline now. - Transition to home PO Lasix 60 mg daily - Strict I/O, daily weights  HTN Chronic, stable,currently at goal. Home regimen: carvedilol 6.25 BID. - continue home carvedilol  CAD S/p PCI in 2010. Recent cath unremarkable. - continue home meds  Constipation Last BM morning of admission 8/9. Usually has BMs daily. Has been passing gas. Do not suspect SBO. Patient does not want enema or manual disimpaction. - continue scheduled Miralax, senna - increase lactulose to TID  Macrocytosis Mildly elevated MCV 101.1 without anemia, folate and B12 wnl. May have underlying MDS. - follow-up outpatient  Parkinson's disease Well-controlled,taking carbidopa-levodopa 50-200 mg 3 times daily,followed by PCP. - Continue home meds  Chronic back pain Well-managed, history of several spinal surgeries. Home regimen: gabapentin 800 mg TID.  - reduced dose gabapentin 600 mg TID - Tylenol prn  Depression, anxiety No current concerns. Home meds: duloxetine 90 mg qhs, buspirone 10 mg TID, citalopram 10 mg qhs. Cautiously continuing SSRI and SNRI, though this is his home regimen. - continue home meds  GERD, mild dysphagia Well-managed, followed by [facility]. Has future colonoscopy/EGD planned and potential esophageal dilation. - continue PPI (protonix formulary)  FEN/GI:Heart healthy diet JYN:[id number]   Disposition: SNF  Subjective:  This morning, patient reports feeling improved with his breathing, legs without swelling. He feels he is at his baseline self now. In terms of his right-sided weakness, he feels he is at about 70% of his normal strength.  Objective: Temp:  [97.2 F (36.2 C)-98.7 F (37.1 C)] 97.2 F (36.2 C) (08/12 1231) Pulse Rate:  [73-84] 77 (08/12 1134) Resp:  [18-20] 20 (08/12 1134) BP: (117-128)/(74-85) 117/82 (08/12 1231) SpO2:  [90 %-98 %] 98 % (08/12 1231) Weight:  [103.1 kg] 103.1 kg (08/12  0918) Physical Exam: General:Overweight elderly male, laying comfortably in bed, NAD Cardiovascular:RRR, no murmurs appreciated Respiratory:CTAB, breathing comfortably  on room air, no wheezes or rales Abdomen:Soft, NT/ND, +BS Extremities:WWP, no edema appreciated Neuro: Alert, interactive, speech without slurring  Laboratory: Recent Labs  Lab 03/25/20 0907 03/25/20 0907 03/28/20 0910 03/28/20 0941 03/30/20 0535  WBC 4.1  --  5.4  --  5.8  HGB 14.2   < > 14.6 13.9 14.5  HCT 43.6   < > 45.0 41.0 44.5  PLT 164  --  153  --  149*   < > = values in this interval not displayed.   Recent Labs  Lab 03/28/20 0910 03/28/20 0941 03/29/20 0500 03/29/20 0500 03/29/20 1821 03/30/20 0535 03/31/20 0146  NA 142   < > 136   < > 139 139 141  K 4.6   < > 3.8   < > 3.6 3.6 3.8  CL 105   < > 101   < > 101 101 99  CO2 26   < > 24   < > 27 25 31   BUN 11   < > 13   < > 15 16 13   CREATININE 0.91   < > 0.93   < > 0.87 0.86 1.05  CALCIUM 8.6*   < > 8.9   < > 9.1 8.8* 8.6*  PROT 6.2*  --  6.1*  --   --   --   --   BILITOT 0.9  --  0.6  --   --   --   --   ALKPHOS 72  --  72  --   --   --   --   ALT 11  --  5  --   --   --   --   AST 14*  --  14*  --   --   --   --   GLUCOSE 114*   < > 134*   < > 118* 119* 117*   < > = values in this interval not displayed.    Folate 15.1 Vitamin B12 191  Imaging/Diagnostic Tests: No new imaging  , MD 03/31/2020, 9:01 AM PGY-1, [facility] Family Medicine FPTS Intern pager: [phone], text pages welcome

## 2020-03-31 NOTE — Progress Notes (Addendum)
STROKE TEAM PROGRESS NOTE   SUBJECTIVE (INTERVAL HISTORY) No family is at the bedside. Pt recounted HPI with me. He stated that Monday he had HA, right eye blurry vision, right sided weakness and difficult getting words out. He was admitted and his HA and blurry vision gradually resolved but his right sided weakness only mild improved so he was sending to SNF today. However, this afternoon, he had again mild HA, felt nauseous, sweaty, right eye blurry vision and funny talking. Neurology was called back. However, he took a short nap and on wakening up, his symptoms were gone. He still has some weakness at right and he would like to go to SNF today in fear of losing his bed in SNF.     OBJECTIVE Temp:  [97.2 F (36.2 C)-98.7 F (37.1 C)] 97.2 F (36.2 C) (08/12 1231) Pulse Rate:  [73-84] 77 (08/12 1134) Cardiac Rhythm: Normal sinus rhythm (08/12 0701) Resp:  [18-20] 20 (08/12 1134) BP: (117-128)/(74-85) 117/82 (08/12 1231) SpO2:  [90 %-98 %] 98 % (08/12 1231) Weight:  [103.1 kg] 103.1 kg (08/12 0918)  Recent Labs  Lab 03/28/20 0933  GLUCAP 99   Recent Labs  Lab 03/28/20 0910 03/28/20 0910 03/28/20 0941 03/29/20 0500 03/29/20 0500 03/29/20 1821 03/30/20 0535 03/31/20 0146  NA 142   < > 142 136  --  139 139 141  K 4.6   < > 4.6 3.8  --  3.6 3.6 3.8  CL 105   < > 106 101  --  101 101 99  CO2 26  --   --  24  --  27 25 31   GLUCOSE 114*   < > 109* 134*  --  118* 119* 117*  BUN 11   < > 12 13  --  15 16 13   CREATININE 0.91   < > 0.90 0.93  --  0.87 0.86 1.05  CALCIUM 8.6*   < >  --  8.9   < > 9.1 8.8* 8.6*   < > = values in this interval not displayed.   Recent Labs  Lab 03/28/20 0910 03/29/20 0500  AST 14* 14*  ALT 11 5  ALKPHOS 72 72  BILITOT 0.9 0.6  PROT 6.2* 6.1*  ALBUMIN 3.6 3.4*   Recent Labs  Lab 03/25/20 0907 03/28/20 0910 03/28/20 0941 03/30/20 0535  WBC 4.1 5.4  --  5.8  NEUTROABS  --  3.8  --   --   HGB 14.2 14.6 13.9 14.5  HCT 43.6 45.0 41.0 44.5   MCV 100.2* 102.0*  --  101.1*  PLT 164 153  --  149*   No results for input(s): CKTOTAL, CKMB, CKMBINDEX, TROPONINI in the last 168 hours. No results for input(s): LABPROT, INR in the last 72 hours. No results for input(s): COLORURINE, LABSPEC, PHURINE, GLUCOSEU, HGBUR, BILIRUBINUR, KETONESUR, PROTEINUR, UROBILINOGEN, NITRITE, LEUKOCYTESUR in the last 72 hours.  Invalid input(s): APPERANCEUR     Component Value Date/Time   CHOL 193 03/29/2020 1030   TRIG 156 (H) 03/29/2020 1030   HDL 50 03/29/2020 1030   CHOLHDL 3.9 03/29/2020 1030   VLDL 31 03/29/2020 1030   LDLCALC 112 (H) 03/29/2020 1030   Lab Results  Component Value Date   HGBA1C 5.6 03/29/2020      Component Value Date/Time   LABOPIA NONE DETECTED 03/28/2020 1200   COCAINSCRNUR NONE DETECTED 03/28/2020 1200   LABBENZ NONE DETECTED 03/28/2020 1200   AMPHETMU NONE DETECTED 03/28/2020 1200   THCU NONE DETECTED  03/28/2020 1200   LABBARB NONE DETECTED 03/28/2020 1200    Recent Labs  Lab 03/28/20 0910  ETH <10    I have personally reviewed the radiological images below and agree with the radiology interpretations.  DG Chest 2 View  Result Date: 03/28/2020 CLINICAL DATA:  Weakness and headache.  Shortness of breath. EXAM: CHEST - 2 VIEW COMPARISON:  March 25, 2020 and Jan 06, 2020 FINDINGS: There is a small right pleural effusion. There is elevation of the right hemidiaphragm, chronic, with right base atelectasis. Lungs otherwise are clear. Heart is borderline enlarged with pulmonary vascularity normal. No adenopathy. No bone lesions. IMPRESSION: Stable small right pleural effusion with right base atelectasis. Mild elevation of the right hemidiaphragm is stable. Lungs elsewhere clear. Stable cardiac prominence. Electronically Signed   By: Bretta BangWilliam  Woodruff III M.D.   On: 03/28/2020 10:36   DG Chest 2 View  Result Date: 03/25/2020 CLINICAL DATA:  CHF.  Shortness of breath. EXAM: CHEST - 2 VIEW COMPARISON:  CT 06/24/2019.   Chest x-ray 01/06/2020, 03/25/2019. FINDINGS: Mediastinum hilar structures normal. Persistent right base atelectasis/scarring. Stable right pleural thickening consistent scarring. No pneumothorax. Stable elevation right hemidiaphragm. Interposition of the colon under the right hemidiaphragm again noted. Postsurgical changes right lower rib again noted. Prior cervical spine fusion. Degenerative change thoracic spine again noted. IMPRESSION: Stable right base pleural-parenchymal thickening consistent with scarring. Stable elevation right hemidiaphragm. No acute abnormality identified. Electronically Signed   By: Maisie Fushomas  Register   On: 03/25/2020 09:18   MR ANGIO HEAD WO CONTRAST  Result Date: 03/28/2020 CLINICAL DATA:  Neuro deficit. EXAM: MRI HEAD WITHOUT CONTRAST MRA HEAD WITHOUT CONTRAST TECHNIQUE: Multiplanar, multiecho pulse sequences of the brain and surrounding structures were obtained without intravenous contrast. Angiographic images of the head were obtained using MRA technique without contrast. COMPARISON:  04/07/2018 MRI head. 03/28/2020 head CT and prior. FINDINGS: MRI HEAD FINDINGS Brain: Generalized cerebral atrophy with ex vacuo dilatation. Scattered T2 hyperintense foci involving the periventricular and subcortical white matter are nonspecific however commonly associated with chronic microvascular ischemic changes. No acute infarct or intracranial hemorrhage. No midline shift, ventriculomegaly or extra-axial fluid collection. No mass lesion. Vascular: See MRA head. Skull and upper cervical spine: Normal marrow signal. Sinuses/Orbits: Normal orbits. Mild pansinus mucosal thickening. No mastoid effusion. Other: None. MRA HEAD FINDINGS Anterior circulation: No significant stenosis, proximal occlusion, aneurysm, or vascular malformation. Posterior circulation: Dominant left vertebral artery. Right vertebral artery terminates as PICA. No significant stenosis, proximal occlusion, aneurysm, or vascular  malformation. Venous sinuses: No evidence of thrombosis. Anatomic variants: Fetal origin of the bilateral PCAs. IMPRESSION: 1. No acute intracranial abnormality. 2. Generalized cerebral atrophy and chronic microvascular ischemic changes. 3. No high-grade vast narrowing, proximal occlusion or aneurysm. Electronically Signed   By: Stana Buntinghikanele  Emekauwa M.D.   On: 03/28/2020 10:40   MR BRAIN WO CONTRAST  Result Date: 03/28/2020 CLINICAL DATA:  Neuro deficit. EXAM: MRI HEAD WITHOUT CONTRAST MRA HEAD WITHOUT CONTRAST TECHNIQUE: Multiplanar, multiecho pulse sequences of the brain and surrounding structures were obtained without intravenous contrast. Angiographic images of the head were obtained using MRA technique without contrast. COMPARISON:  04/07/2018 MRI head. 03/28/2020 head CT and prior. FINDINGS: MRI HEAD FINDINGS Brain: Generalized cerebral atrophy with ex vacuo dilatation. Scattered T2 hyperintense foci involving the periventricular and subcortical white matter are nonspecific however commonly associated with chronic microvascular ischemic changes. No acute infarct or intracranial hemorrhage. No midline shift, ventriculomegaly or extra-axial fluid collection. No mass lesion. Vascular: See MRA head. Skull  and upper cervical spine: Normal marrow signal. Sinuses/Orbits: Normal orbits. Mild pansinus mucosal thickening. No mastoid effusion. Other: None. MRA HEAD FINDINGS Anterior circulation: No significant stenosis, proximal occlusion, aneurysm, or vascular malformation. Posterior circulation: Dominant left vertebral artery. Right vertebral artery terminates as PICA. No significant stenosis, proximal occlusion, aneurysm, or vascular malformation. Venous sinuses: No evidence of thrombosis. Anatomic variants: Fetal origin of the bilateral PCAs. IMPRESSION: 1. No acute intracranial abnormality. 2. Generalized cerebral atrophy and chronic microvascular ischemic changes. 3. No high-grade vast narrowing, proximal  occlusion or aneurysm. Electronically Signed   By: Stana Bunting M.D.   On: 03/28/2020 10:40   ECHOCARDIOGRAM COMPLETE  Result Date: 03/29/2020    ECHOCARDIOGRAM REPORT   Patient Name:   Raymond White Date of Exam: 03/29/2020 Medical Rec #:  [id number]         Height:       73.0 in Accession #:    [id number]        Weight:       234.0 lb Date of Birth:  [birth year]         BSA:          2.300 m Patient Age:    69 years          BP:           120/78 mmHg Patient Gender: M                 HR:           69 bpm. Exam Location:  Inpatient Procedure: 2D Echo, Cardiac Doppler and Color Doppler Indications:    Dyspnea  History:        Patient has no prior history of Echocardiogram examinations.                 CHF, CAD, Stroke; Signs/Symptoms:Dyspnea.  Sonographer:    Lavenia Atlas Referring Phys: [phone] SCOTT GOLDSTON IMPRESSIONS  1. Left ventricular ejection fraction, by estimation, is 55 to 60%. The left ventricle has normal function. The left ventricle has no regional wall motion abnormalities. Left ventricular diastolic parameters are consistent with Grade I diastolic dysfunction (impaired relaxation).  2. Right ventricular systolic function is normal. The right ventricular size is normal. There is normal pulmonary artery systolic pressure.  3. Left atrial size was mildly dilated.  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 No evidence of mitral valve regurgitation.  5. The aortic valve is abnormal. Aortic valve regurgitation is mild.  6. The inferior vena cava is normal in size with greater than 50% respiratory variability, suggesting right atrial pressure of 3 mmHg. FINDINGS  Left Ventricle: Left ventricular ejection fraction, by estimation, is 55 to 60%. The left ventricle has normal function. The left ventricle has no regional wall motion abnormalities. The left ventricular internal cavity size was normal in size. There is  no left ventricular hypertrophy. Left ventricular diastolic parameters are consistent  with Grade I diastolic dysfunction (impaired relaxation). Normal left ventricular filling pressure. Right Ventricle: The right ventricular size is normal. No increase in right ventricular wall thickness. Right ventricular systolic function is normal. There is normal pulmonary artery systolic pressure. The tricuspid regurgitant velocity is 2.28 m/s, and  with an assumed right atrial pressure of 3 mmHg, the estimated right ventricular systolic pressure is 23.8 mmHg. Left Atrium: Left atrial size was mildly dilated. Right Atrium: Right atrial size was not well visualized. Pericardium: There is no evidence of pericardial effusion. Mitral Valve: The mitral valve is myxomatous. There  is mild late systolic prolapse of the middle scallop of the posterior leaflet of the mitral valve. There is mild thickening of the mitral valve leaflet(s). No evidence of mitral valve regurgitation. Tricuspid Valve: The tricuspid valve is normal in structure. Tricuspid valve regurgitation is trivial. Aortic Valve: The aortic valve is abnormal. Aortic valve regurgitation is mild. Pulmonic Valve: The pulmonic valve was not well visualized. Pulmonic valve regurgitation is trivial. Aorta: The aortic root is normal in size and structure. Venous: The inferior vena cava is normal in size with greater than 50% respiratory variability, suggesting right atrial pressure of 3 mmHg. IAS/Shunts: No atrial level shunt detected by color flow Doppler.  LEFT VENTRICLE PLAX 2D LVIDd:         4.60 cm  Diastology LVIDs:         3.20 cm  LV e' lateral:   7.51 cm/s LV PW:         1.10 cm  LV E/e' lateral: 5.0 LV IVS:        1.20 cm  LV e' medial:    7.07 cm/s LVOT diam:     2.40 cm  LV E/e' medial:  5.3 LV SV:         57 LV SV Index:   25 LVOT Area:     4.52 cm  RIGHT VENTRICLE RV Basal diam:  3.70 cm RV S prime:     7.18 cm/s LEFT ATRIUM             Index       RIGHT ATRIUM           Index LA diam:        4.10 cm 1.78 cm/m  RA Area:     20.90 cm LA Vol (A2C):    38.6 ml 16.78 ml/m RA Volume:   63.80 ml  27.74 ml/m LA Vol (A4C):   55.5 ml 24.13 ml/m LA Biplane Vol: 45.7 ml 19.87 ml/m  AORTIC VALVE LVOT Vmax:   77.70 cm/s LVOT Vmean:  45.200 cm/s LVOT VTI:    0.127 m  AORTA Ao Root diam: 3.60 cm MITRAL VALVE               TRICUSPID VALVE MV Area (PHT): 2.56 cm    TR Peak grad:   20.8 mmHg MV Decel Time: 296 msec    TR Vmax:        228.00 cm/s MV E velocity: 37.30 cm/s MV A velocity: 68.10 cm/s  SHUNTS MV E/A ratio:  0.55        Systemic VTI:  0.13 m                            Systemic Diam: 2.40 cm Rachelle Hora Croitoru MD Electronically signed by Thurmon Fair MD Signature Date/Time: 03/29/2020/12:26:36 PM    Final    CT HEAD CODE STROKE WO CONTRAST  Result Date: 03/28/2020 CLINICAL DATA:  Code stroke. EXAM: CT HEAD WITHOUT CONTRAST TECHNIQUE: Contiguous axial images were obtained from the base of the skull through the vertex without intravenous contrast. COMPARISON:  None. FINDINGS: Brain: There is no acute intracranial hemorrhage, mass effect, or edema. Gray-white differentiation is preserved. Prominence of the ventricles and sulci reflects stable parenchymal volume loss. Patchy hypoattenuation in the supratentorial white matter is nonspecific but probably reflects stable chronic microvascular ischemic changes. There is no extra-axial fluid collection. Vascular: No definite hyperdense vessel. Skull: Unremarkable. Sinuses/Orbits: No acute abnormality. Other: Mastoid  air cells are clear. ASPECTS ([place] Stroke Program Early CT Score) - Ganglionic level infarction (caudate, lentiform nuclei, internal capsule, insula, M1-M3 cortex): 7 - Supraganglionic infarction (M4-M6 cortex): 3 Total score (0-10 with 10 being normal): 10 IMPRESSION: No acute intracranial hemorrhage or evidence of acute infarction. ASPECT score is 10. These results were communicated to Dr. Iver Nestle at 9:44 amon 8/9/2021by text page via the [facility] messaging system. Electronically Signed   By: Guadlupe Spanish  M.D.   On: 03/28/2020 09:50   VAS [place] LOWER EXTREMITY VENOUS (DVT)  Result Date: 03/28/2020  Lower Venous DVTStudy Indications: Swelling.  Comparison Study: no prior Performing Technologist: Blanch Media RVS  Examination Guidelines: A complete evaluation includes B-mode imaging, spectral Doppler, color Doppler, and power Doppler as needed of all accessible portions of each vessel. Bilateral testing is considered an integral part of a complete examination. Limited examinations for reoccurring indications may be performed as noted. The reflux portion of the exam is performed with the patient in reverse Trendelenburg.  +---------+---------------+---------+-----------+----------+--------------+ RIGHT    CompressibilityPhasicitySpontaneityPropertiesThrombus Aging +---------+---------------+---------+-----------+----------+--------------+ CFV      Full           Yes      Yes                                 +---------+---------------+---------+-----------+----------+--------------+ SFJ      Full                                                        +---------+---------------+---------+-----------+----------+--------------+ FV Prox  Full                                                        +---------+---------------+---------+-----------+----------+--------------+ FV Mid   Full                                                        +---------+---------------+---------+-----------+----------+--------------+ FV DistalFull                                                        +---------+---------------+---------+-----------+----------+--------------+ PFV      Full                                                        +---------+---------------+---------+-----------+----------+--------------+ POP      Full           Yes      Yes                                 +---------+---------------+---------+-----------+----------+--------------+  PTV      Full                                                         +---------+---------------+---------+-----------+----------+--------------+ PERO     Full                                                        +---------+---------------+---------+-----------+----------+--------------+   +---------+---------------+---------+-----------+----------+--------------+ LEFT     CompressibilityPhasicitySpontaneityPropertiesThrombus Aging +---------+---------------+---------+-----------+----------+--------------+ CFV      Full           Yes      Yes                                 +---------+---------------+---------+-----------+----------+--------------+ SFJ      Full                                                        +---------+---------------+---------+-----------+----------+--------------+ FV Prox  Full                                                        +---------+---------------+---------+-----------+----------+--------------+ FV Mid   Full                                                        +---------+---------------+---------+-----------+----------+--------------+ FV DistalFull                                                        +---------+---------------+---------+-----------+----------+--------------+ PFV      Full                                                        +---------+---------------+---------+-----------+----------+--------------+ POP      Full           Yes      Yes                                 +---------+---------------+---------+-----------+----------+--------------+ PTV      Full                                                        +---------+---------------+---------+-----------+----------+--------------+  PERO                                                  Not visualized +---------+---------------+---------+-----------+----------+--------------+     Summary: BILATERAL: - No evidence of deep vein thrombosis seen in the lower  extremities, bilaterally. - No evidence of superficial venous thrombosis in the lower extremities, bilaterally. - RIGHT: - A cystic structure is found in the popliteal fossa.  LEFT: - No cystic structure found in the popliteal fossa.  *See table(s) above for measurements and observations. Electronically signed by Fabienne Bruns MD on 03/28/2020 at 5:13:08 PM.    Final     PHYSICAL EXAM  Temp:  [97.2 F (36.2 C)-98.7 F (37.1 C)] 97.2 F (36.2 C) (08/12 1231) Pulse Rate:  [73-84] 77 (08/12 1134) Resp:  [18-20] 20 (08/12 1134) BP: (117-128)/(74-85) 117/82 (08/12 1231) SpO2:  [90 %-98 %] 98 % (08/12 1231) Weight:  [103.1 kg] 103.1 kg (08/12 0918)  General - Well nourished, well developed, in no apparent distress.  Ophthalmologic - fundi not visualized due to noncooperation.  Cardiovascular - Regular rhythm and rate.  Mental Status -  Level of arousal and orientation to time, place, and person were intact. Language including expression, naming, repetition, comprehension was assessed and found intact. Fund of Knowledge was assessed and was intact.  Cranial Nerves II - XII - II - Visual field intact OU. III, IV, VI - Extraocular movements intact. V - Facial sensation decreased on the right, about 80% of the left. VII - mild right nasolabial fold flattening. VIII - Hearing & vestibular intact bilaterally. X - Palate elevates symmetrically. XI - Chin turning & shoulder shrug intact bilaterally. XII - Tongue protrusion intact.  Motor Strength - The patient's strength was normal in RUE and RLE extremities and pronator drift was absent. However, he had LUE and LLE 4/5 with lack of effort and give away weakness. Bulk was normal and fasciculations were absent.   Motor Tone - Muscle tone was assessed at the neck and appendages and was normal.  Reflexes - The patient's reflexes were symmetrical in all extremities and he had no pathological reflexes.  Sensory - Light touch,  temperature/pinprick were assessed and were decreased on the right, about 80% of the left.    Coordination - The patient had normal movements in the hands with no ataxia or dysmetria.  Tremor was absent.  Gait and Station - deferred.   ASSESSMENT/PLAN Mr. Raymond White is a 69 y.o. male with history of CHF, HTN, HLD, PD, LBP admitted for HA, right blurry vision, right sided weakness and speech difficulty. No tPA given due to no stroke on MRI.    Right sided weakness - ?? Functional   CT no acute abnormalities  MRI  No acute infarct  MRA  Unremarkable  Will repeat limited MRI  2D Echo  EF 55-60%  LE venous doppler neg for DVT  CUS pending  LDL 112  HgbA1c 5.6  lovenox for VTE prophylaxis  aspirin 81 mg daily and clopidogrel 75 mg daily prior to admission, now on aspirin 81 mg daily and clopidogrel 75 mg daily. Continue on discharge  Patient counseled to be compliant with his antithrombotic medications  Ongoing aggressive stroke risk factor management  Therapy recommendations:  SNF  Disposition:  pending  Hypertension . Stable  Long term BP goal  normotensive  Hyperlipidemia  Home meds:  lipitor 80   LDL 112, goal < 70  Now on lipitor 80  Continue statin at discharge  Other Stroke Risk Factors  Advanced age  CHF  "TIA" in the past  Other Active Problems  PD on sinemet  Hospital day # 2   Marvel Plan, MD PhD Stroke Neurology 03/31/2020 2:57 PM    To contact Stroke Continuity provider, please refer to [email or web address]. After hours, contact General Neurology

## 2020-03-31 NOTE — Progress Notes (Addendum)
FPTS Interim Progress Note  S: Patient with new headache, right sided blurred vision, and word-finding difficulty with nausea and "hot flash"-like sensation that occurred about 30-60 min ago, similar to presentation on admission.  Still having symptoms currently.  Patient reports he was sitting in his chair watching television when the symptoms occurred.  O: BP 117/82 (BP Location: Right Arm)   Pulse 77   Temp (!) 97.2 F (36.2 C) (Oral)   Resp 20   Ht 6\' 1"  (1.854 m)   Wt 103.1 kg   SpO2 98%   BMI 29.99 kg/m   General: Overweight elderly male, sitting in chair, NAD Eyes: PERRL Neuro: mild right-sided facial droop, 5/5 strength to left upper and lower extremities, 4/5 strength to right upper and lower extremities but weaker compared to previous, some difficulty with right finger-to-nose and right heel-to-shin, left finger-to-nose and heel-to-shin intact  CN III, IV, VI: intact, but difficulty tracking finger to right, pupils equally reactive to light  CN V: decreased sensation to entire right side of face  CN VII: intact, able to smile but with mild droop when not smiling  CN VIII: intact, able to hear finger rub bilaterally  CN IX, X: speech intact, stuttering but no slurring  CN XI: intact shoulder shrug bilaterally  CN XII: intact, tongue protrudes midline  A/P: Originally planned for discharge to SNF today; however, may need to be delayed due to new/worsening neurological symptoms. Concerning for CVA despite normal CT and MRI/MRA on 8/9, may need repeat imaging. - spoke to Dr. 10/9, neurology - will assess and determine need for repeat imaging  Roda Shutters, MD 03/31/2020, 1:58 PM PGY-1, [facility] pager [phone]

## 2020-03-31 NOTE — Hospital Course (Addendum)
Raymond White is a 69 y.o. male with history including CVA, CAD, HFpEF, asthma, and Parkinson's presenting with sudden onset right-sided weakness and numbness with visual changes, ruled out due to normal MRI/MRA. Also with CHF exacerbation. Hospital course listed by problem below:  Unilateral right-sided weakness Patient presented with acute onset right-sided upper and lower extremity weakness, right-sided facial droop, right reported vision, and headache.  Patient is afebrile and hemodynamically stable on arrival, presented as a code stroke still within TPA window with need evaluation by neurology.  CT head as well as MRI/MRA brain were obtained without any intracranial abnormalities, so tPA was not administered.  Ethanol level, UDS, CMP, and CBC were all unremarkable.  Lipid panel with elevated LDL and triglycerides, otherwise unremarkable.  On 8/12, as patient was about to be discharged, he developed recurrence of headache, right sided blurred vision, and word-finding difficulty with nausea and "hot flash"-like sensation. He had some worsening right-sided extremity weakness and mild facial droop. He had repeat imaging with limited MRI, which did not reveal any acute infarct. He also had a carotid doppler US without significant stenosis, official read pending at the time of discharge.   By the time of discharge, he had resolution of facial droop and blurred vision as well as improvement in his extremity weakness, though still had some residual weakness.  He was continued on his home DAPT therapy and statin.  Ultimately, it was thought that his symptoms were due to his previous CVA.  HFpEF Patient presented with several weeks of worsening dyspnea on exertion, orthopnea requiring 3-4 pillows at night, and bilateral lower extremity edema.  He had a repeat TTE during admission with EF 55 to 60% and grade 1 diastolic dysfunction.  He was treated with IV Lasix with good response.  By the time of discharge,  he had resolution of his shortness of breath and lower extremity edema.  He was discharged on his home regimen of Lasix 60 mg once a day. Discharge weight 102.3 kg.  CAD Patient has a history of CAD status post stent in 2010.  Reportedly had a cardiac catheterization 2 weeks ago at the [place] which he stated was normal.  Patient had no chest pain throughout admission and was continued on his home regimen.  Constipation Resolved. Last BM prior to admission on 8/9. He was treated with scheduled Miralax and senna as well as lactulose. Had a BM on 8/12.

## 2020-03-31 NOTE — Progress Notes (Addendum)
Patient called and c/o headache with right eye blurred vision.He also reported nausea.Vital and assessment done. MD made aware. Will come to see patient.

## 2020-03-31 NOTE — TOC Initial Note (Signed)
Transition of Care [facility]) - Initial/Assessment Note    Patient Details  Name: Raymond White MRN: [id number] Date of Birth: 08/26/1950  [facility] CM/SW Contact:    Kermit Balo, RN Phone Number: 03/31/2020, 3:21 PM  Clinical Narrative:                 Pt with some changes today and d/c on hold until am. CM has updated Olegario Messier at [facility]. Olegario Messier says they will hold pts bed until am.  TOC following.  Expected Discharge Plan: Skilled Nursing Facility Barriers to Discharge: Continued Medical Work up   Patient Goals and CMS Choice   CMS Medicare.gov Compare Post Acute Care list provided to:: Patient Choice offered to / list presented to : Patient  Expected Discharge Plan and Services Expected Discharge Plan: Skilled Nursing Facility In-house Referral: Clinical Social Work Discharge Planning Services: CM Consult Post Acute Care Choice: Skilled Nursing Facility Living arrangements for the past 2 months: Single Family Home                                      Prior Living Arrangements/Services Living arrangements for the past 2 months: Single Family Home Lives with:: Self Patient language and need for interpreter reviewed:: Yes Do you feel safe going back to the place where you live?: Yes      Need for Family Participation in Patient Care: Yes (Comment) Care giver support system in place?: No (comment) Current home services: Homehealth aide Criminal Activity/Legal Involvement Pertinent to Current Situation/Hospitalization: No - Comment as needed  Activities of Daily Living      Permission Sought/Granted                  Emotional Assessment Appearance:: Appears stated age Attitude/Demeanor/Rapport: Engaged Affect (typically observed): Accepting Orientation: : Oriented to Self, Oriented to Place, Oriented to  Time, Oriented to Situation   Psych Involvement: No (comment)  Admission diagnosis:  Acute right-sided weakness [R53.1] Right sided  weakness [R53.1] Acute congestive heart failure, unspecified heart failure type (HCC) [I50.9] Heart failure (HCC) [I50.9] Patient Active Problem List   Diagnosis Date Noted  . Acute congestive heart failure (HCC) 03/29/2020  . Acute right-sided weakness 03/28/2020  . Dyspnea on exertion 03/28/2020  . Respiratory failure with hypoxia (HCC) 10/22/2018  . Acute respiratory failure with hypoxia (HCC) 10/21/2018  . Influenza A 10/21/2018  . Parkinson disease (HCC) 10/21/2018  . DDD (degenerative disc disease), lumbar 12/15/2017  . Klebsiella cystitis 12/15/2017  . 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  . Chronic heart failure with preserved ejection fraction (HCC) 12/15/2017  . GERD (gastroesophageal reflux disease) 12/15/2017  . Hypertension 12/15/2017  . Depressive disorder 12/15/2017  . Anxiety 12/15/2017   PCP:  Judeth Horn, PA-C Pharmacy:   [facility] - [place], [place] - [address] [phone] [facility] [place] [address] Phone: [phone] Fax: [phone]     Social Determinants of Health (SDOH) Interventions    Readmission Risk Interventions No flowsheet data found.

## 2020-03-31 NOTE — Discharge Summary (Addendum)
[facility]  Patient name: Raymond White Medical record number: [id number] Date of birth: [birth year] Age: 69 y.o. Gender: male Date of Admission: 03/28/2020  Date of Discharge: 04/01/2020 Admitting Physician: Littie Deedsichard Sun, MD  Primary Care Provider: Judeth Hornamsey, Robert M, PA-C Consultants: Neurology  Indication for Hospitalization: Unilateral weakness, DOE  Discharge Diagnoses/Problem List:  Active Problems:   Movement disorder   Chronic heart failure with preserved ejection fraction (HCC)   Anxiety   Parkinson disease (HCC)   Acute right-sided weakness   Dyspnea on exertion   Acute congestive heart failure (HCC)  Disposition: SNF  Discharge Condition: Stable  Discharge Exam:  General: Obese elderly male, laying comfortably in bed, NAD Cardiovascular: RRR, no murmurs appreciated Respiratory: CTAB, breathing comfortably on room air, no wheezes or rales Abdomen: Soft, NT/ND, +BS Extremities: WWP, no edema appreciated Neuro: Alert, interactive, speech without slurring, no word-finding difficulty, mild right-sided facial droop, 5/5 strength left upper/lower extremities, 4+/5 strength right upper/lower extremities, CN II-XII intact (full sensation to face bilaterally)   Brief Hospital Course:  Raymond White is a 69 y.o. male with history including CVA, CAD, HFpEF, asthma, and Parkinson's presenting with sudden onset right-sided weakness and numbness with visual changes, ruled out due to normal MRI/MRA. Also with CHF exacerbation. Hospital course listed by problem below:  Unilateral right-sided weakness Patient presented with acute onset right-sided upper and lower extremity weakness, right-sided facial droop, right reported vision, and headache.  Patient is afebrile and hemodynamically stable on arrival, presented as a code stroke still within TPA window with need evaluation by neurology.  CT head as well as MRI/MRA brain were obtained  without any intracranial abnormalities, so tPA was not administered.  Ethanol level, UDS, CMP, and CBC were all unremarkable.  Lipid panel with elevated LDL and triglycerides, otherwise unremarkable.  On 8/12, as patient was about to be discharged, he developed recurrence of headache, right sided blurred vision, and word-finding difficulty with nausea and "hot flash"-like sensation. He had some worsening right-sided extremity weakness and mild facial droop. He had repeat imaging with limited MRI, which did not reveal any acute infarct. He also had a carotid doppler US without significant stenosis, official read pending at the time of discharge.   By the time of discharge, he had resolution of facial droop and blurred vision as well as improvement in his extremity weakness, though still had some residual weakness.  He was continued on his home DAPT therapy and statin.  Ultimately, it was thought that his symptoms were due to his previous CVA.  HFpEF Patient presented with several weeks of worsening dyspnea on exertion, orthopnea requiring 3-4 pillows at night, and bilateral lower extremity edema.  He had a repeat TTE during admission with EF 55 to 60% and grade 1 diastolic dysfunction.  He was treated with IV Lasix with good response.  By the time of discharge, he had resolution of his shortness of breath and lower extremity edema.  He was discharged on his home regimen of Lasix 60 mg once a day. Discharge weight 102.3 kg.  CAD Patient has a history of CAD status post stent in 2010.  Reportedly had a cardiac catheterization 2 weeks ago at the [facility] which he stated was normal.  Patient had no chest pain throughout admission and was continued on his home regimen.  Constipation Resolved. Last BM prior to admission on 8/9. He was treated with scheduled Miralax and senna as well as lactulose. Had a BM on  8/12.   Discharge Diet: heart healthy diet   Issues for Follow Up:  Follow-up right extremity  weakness Please confirm patient has been able to tolerate lasix 60mg  daily.  Please check oxygen saturation and consider additional diuresis if patient has increased weight or experiences SOB.  Recommend BMP to monitor potassium following discharge.   Significant Procedures: None  Significant Labs and Imaging:  Recent Labs  Lab 03/28/20 0910 03/28/20 0941 03/30/20 0535  WBC 5.4  --  5.8  HGB 14.6 13.9 14.5  HCT 45.0 41.0 44.5  PLT 153  --  149*   Recent Labs  Lab 03/28/20 0910 03/28/20 0910 03/28/20 0941 03/28/20 0941 03/29/20 0500 03/29/20 0500 03/29/20 1821 03/29/20 1821 03/30/20 0535 03/31/20 0146  NA 142   < > 142  --  136  --  139  --  139 141  K 4.6   < > 4.6   < > 3.8   < > 3.6   < > 3.6 3.8  CL 105   < > 106  --  101  --  101  --  101 99  CO2 26  --   --   --  24  --  27  --  25 31  GLUCOSE 114*   < > 109*  --  134*  --  118*  --  119* 117*  BUN 11   < > 12  --  13  --  15  --  16 13  CREATININE 0.91   < > 0.90  --  0.93  --  0.87  --  0.86 1.05  CALCIUM 8.6*  --   --   --  8.9  --  9.1  --  8.8* 8.6*  ALKPHOS 72  --   --   --  72  --   --   --   --   --   AST 14*  --   --   --  14*  --   --   --   --   --   ALT 11  --   --   --  5  --   --   --   --   --   ALBUMIN 3.6  --   --   --  3.4*  --   --   --   --   --    < > = values in this interval not displayed.    DG Chest 2 View  Result Date: 03/28/2020 CLINICAL DATA:  Weakness and headache.  Shortness of breath. EXAM: CHEST - 2 VIEW COMPARISON:  March 25, 2020 and Jan 06, 2020 FINDINGS: There is a small right pleural effusion. There is elevation of the right hemidiaphragm, chronic, with right base atelectasis. Lungs otherwise are clear. Heart is borderline enlarged with pulmonary vascularity normal. No adenopathy. No bone lesions. IMPRESSION: Stable small right pleural effusion with right base atelectasis. Mild elevation of the right hemidiaphragm is stable. Lungs elsewhere clear. Stable cardiac prominence.  Electronically Signed   By: Jan 08, 2020 III M.D.   On: 03/28/2020 10:36   MR ANGIO HEAD WO CONTRAST  Result Date: 03/28/2020 CLINICAL DATA:  Neuro deficit. EXAM: MRI HEAD WITHOUT CONTRAST MRA HEAD WITHOUT CONTRAST TECHNIQUE: Multiplanar, multiecho pulse sequences of the brain and surrounding structures were obtained without intravenous contrast. Angiographic images of the head were obtained using MRA technique without contrast. COMPARISON:  04/07/2018 MRI head. 03/28/2020 head CT and prior. FINDINGS: MRI  HEAD FINDINGS Brain: Generalized cerebral atrophy with ex vacuo dilatation. Scattered T2 hyperintense foci involving the periventricular and subcortical White matter are nonspecific however commonly associated with chronic microvascular ischemic changes. No acute infarct or intracranial hemorrhage. No midline shift, ventriculomegaly or extra-axial fluid collection. No mass lesion. Vascular: See MRA head. Skull and upper cervical spine: Normal marrow signal. Sinuses/Orbits: Normal orbits. Mild pansinus mucosal thickening. No mastoid effusion. Other: None. MRA HEAD FINDINGS Anterior circulation: No significant stenosis, proximal occlusion, aneurysm, or vascular malformation. Posterior circulation: Dominant left vertebral artery. Right vertebral artery terminates as PICA. No significant stenosis, proximal occlusion, aneurysm, or vascular malformation. Venous sinuses: No evidence of thrombosis. Anatomic variants: Fetal origin of the bilateral PCAs. IMPRESSION: 1. No acute intracranial abnormality. 2. Generalized cerebral atrophy and chronic microvascular ischemic changes. 3. No high-grade vast narrowing, proximal occlusion or aneurysm. Electronically Signed   By: Stana Bunting M.D.   On: 03/28/2020 10:40   MR BRAIN WO CONTRAST  Result Date: 03/28/2020 CLINICAL DATA:  Neuro deficit. EXAM: MRI HEAD WITHOUT CONTRAST MRA HEAD WITHOUT CONTRAST TECHNIQUE: Multiplanar, multiecho pulse sequences of the brain  and surrounding structures were obtained without intravenous contrast. Angiographic images of the head were obtained using MRA technique without contrast. COMPARISON:  04/07/2018 MRI head. 03/28/2020 head CT and prior. FINDINGS: MRI HEAD FINDINGS Brain: Generalized cerebral atrophy with ex vacuo dilatation. Scattered T2 hyperintense foci involving the periventricular and subcortical White matter are nonspecific however commonly associated with chronic microvascular ischemic changes. No acute infarct or intracranial hemorrhage. No midline shift, ventriculomegaly or extra-axial fluid collection. No mass lesion. Vascular: See MRA head. Skull and upper cervical spine: Normal marrow signal. Sinuses/Orbits: Normal orbits. Mild pansinus mucosal thickening. No mastoid effusion. Other: None. MRA HEAD FINDINGS Anterior circulation: No significant stenosis, proximal occlusion, aneurysm, or vascular malformation. Posterior circulation: Dominant left vertebral artery. Right vertebral artery terminates as PICA. No significant stenosis, proximal occlusion, aneurysm, or vascular malformation. Venous sinuses: No evidence of thrombosis. Anatomic variants: Fetal origin of the bilateral PCAs. IMPRESSION: 1. No acute intracranial abnormality. 2. Generalized cerebral atrophy and chronic microvascular ischemic changes. 3. No high-grade vast narrowing, proximal occlusion or aneurysm. Electronically Signed   By: Stana Bunting M.D.   On: 03/28/2020 10:40   ECHOCARDIOGRAM COMPLETE  Result Date: 03/29/2020    ECHOCARDIOGRAM REPORT   Patient Name:   EZIAH NEGRO Date of Exam: 03/29/2020 Medical Rec #:  [id number]         Height:       73.0 in Accession #:    [id number]        Weight:       234.0 lb Date of Birth:  August 20, 1951         BSA:          2.300 m Patient Age:    69 years          BP:           120/78 mmHg Patient Gender: M                 HR:           69 bpm. Exam Location:  Inpatient Procedure: 2D Echo, Cardiac Doppler  and Color Doppler Indications:    Dyspnea  History:        Patient has no prior history of Echocardiogram examinations.                 CHF, CAD, Stroke; Signs/Symptoms:Dyspnea.  Sonographer:    Nehemiah Settle  [place] Referring Phys: [id number] [address] G[place]ON IMPRESSIONS  1. Left ventricular ejection fraction, by estimation, is 55 to 60%. The left ventricle has normal function. The left ventricle has no regional wall motion abnormalities. Left ventricular diastolic parameters are consistent with Grade I diastolic dysfunction (impaired relaxation).  2. Right ventricular systolic function is normal. The right ventricular size is normal. There is normal pulmonary artery systolic pressure.  3. Left atrial size was mildly dilated.  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 No evidence of mitral valve regurgitation.  5. The aortic valve is abnormal. Aortic valve regurgitation is mild.  6. The inferior vena cava is normal in size with greater than 50% respiratory variability, suggesting right atrial pressure of 3 mmHg. FINDINGS  Left Ventricle: Left ventricular ejection fraction, by estimation, is 55 to 60%. The left ventricle has normal function. The left ventricle has no regional wall motion abnormalities. The left ventricular internal cavity size was normal in size. There is  no left ventricular hypertrophy. Left ventricular diastolic parameters are consistent with Grade I diastolic dysfunction (impaired relaxation). Normal left ventricular filling pressure. Right Ventricle: The right ventricular size is normal. No increase in right ventricular wall thickness. Right ventricular systolic function is normal. There is normal pulmonary artery systolic pressure. The tricuspid regurgitant velocity is 2.28 m/s, and  with an assumed right atrial pressure of 3 mmHg, the estimated right ventricular systolic pressure is 23.8 mmHg. Left Atrium: Left atrial size was mildly dilated. Right Atrium: Right atrial size was not well visualized. Pericardium:  There is no evidence of pericardial effusion. Mitral Valve: The mitral valve is myxomatous. There is mild late systolic prolapse of the middle scallop of the posterior leaflet of the mitral valve. There is mild thickening of the mitral valve leaflet(s). No evidence of mitral valve regurgitation. Tricuspid Valve: The tricuspid valve is normal in structure. Tricuspid valve regurgitation is trivial. Aortic Valve: The aortic valve is abnormal. Aortic valve regurgitation is mild. Pulmonic Valve: The pulmonic valve was not well visualized. Pulmonic valve regurgitation is trivial. Aorta: The aortic root is normal in size and structure. Venous: The inferior vena cava is normal in size with greater than 50% respiratory variability, suggesting right atrial pressure of 3 mmHg. IAS/Shunts: No atrial level shunt detected by color flow Doppler.  LEFT VENTRICLE PLAX 2D LVIDd:         4.60 cm  Diastology LVIDs:         3.20 cm  LV e' lateral:   7.51 cm/s LV PW:         1.10 cm  LV E/e' lateral: 5.0 LV IVS:        1.20 cm  LV e' medial:    7.07 cm/s LVOT diam:     2.40 cm  LV E/e' medial:  5.3 LV SV:         57 LV SV Index:   25 LVOT Area:     4.52 cm  RIGHT VENTRICLE RV Basal diam:  3.70 cm RV S prime:     7.18 cm/s LEFT ATRIUM             Index       RIGHT ATRIUM           Index LA diam:        4.10 cm 1.78 cm/m  RA Area:     20.90 cm LA Vol (A2C):   38.6 ml 16.78 ml/m RA Volume:   63.80 ml  27.74 ml/m LA Vol (A4C):  55.5 ml 24.13 ml/m LA Biplane Vol: 45.7 ml 19.87 ml/m  AORTIC VALVE LVOT Vmax:   77.70 cm/s LVOT Vmean:  45.200 cm/s LVOT VTI:    0.127 m  AORTA Ao Root diam: 3.60 cm MITRAL VALVE               TRICUSPID VALVE MV Area (PHT): 2.56 cm    TR Peak grad:   20.8 mmHg MV Decel Time: 296 msec    TR Vmax:        228.00 cm/s MV E velocity: 37.30 cm/s MV A velocity: 68.10 cm/s  SHUNTS MV E/A ratio:  0.55        Systemic VTI:  0.13 m                            Systemic Diam: 2.40 cm Rachelle Hora Croitoru MD Electronically  signed by Thurmon Fair MD Signature Date/Time: 03/29/2020/12:26:36 PM    Final    CT HEAD CODE STROKE WO CONTRAST  Result Date: 03/28/2020 CLINICAL DATA:  Code stroke. EXAM: CT HEAD WITHOUT CONTRAST TECHNIQUE: Contiguous axial images were obtained from the base of the skull through the vertex without intravenous contrast. COMPARISON:  None. FINDINGS: Brain: There is no acute intracranial hemorrhage, mass effect, or edema. Gray-White differentiation is preserved. Prominence of the ventricles and sulci reflects stable parenchymal volume loss. Patchy hypoattenuation in the supratentorial White matter is nonspecific but probably reflects stable chronic microvascular ischemic changes. There is no extra-axial fluid collection. Vascular: No definite hyperdense vessel. Skull: Unremarkable. Sinuses/Orbits: No acute abnormality. Other: Mastoid air cells are clear. ASPECTS (Alberta Stroke Program Early CT Score) - Ganglionic level infarction (caudate, lentiform nuclei, internal capsule, insula, M1-M3 cortex): 7 - Supraganglionic infarction (M4-M6 cortex): 3 Total score (0-10 with 10 being normal): 10 IMPRESSION: No acute intracranial hemorrhage or evidence of acute infarction. ASPECT score is 10. These results were communicated to Dr. Iver Nestle at 9:44 amon 8/9/2021by text page via the [facility] messaging system. Electronically Signed   By: Guadlupe Spanish M.D.   On: 03/28/2020 09:50   VAS [place] LOWER EXTREMITY VENOUS (DVT)  Result Date: 03/28/2020  Lower Venous DVTStudy Indications: Swelling.  Comparison Study: no prior Performing Technologist: Blanch Media RVS  Examination Guidelines: A complete evaluation includes B-mode imaging, spectral Doppler, color Doppler, and power Doppler as needed of all accessible portions of each vessel. Bilateral testing is considered an integral part of a complete examination. Limited examinations for reoccurring indications may be performed as noted. The reflux portion of the exam is performed  with the patient in reverse Trendelenburg.  +---------+---------------+---------+-----------+----------+--------------+ RIGHT    CompressibilityPhasicitySpontaneityPropertiesThrombus Aging +---------+---------------+---------+-----------+----------+--------------+ CFV      Full           Yes      Yes                                 +---------+---------------+---------+-----------+----------+--------------+ SFJ      Full                                                        +---------+---------------+---------+-----------+----------+--------------+ FV Prox  Full                                                        +---------+---------------+---------+-----------+----------+--------------+  FV Mid   Full                                                        +---------+---------------+---------+-----------+----------+--------------+ FV DistalFull                                                        +---------+---------------+---------+-----------+----------+--------------+ PFV      Full                                                        +---------+---------------+---------+-----------+----------+--------------+ POP      Full           Yes      Yes                                 +---------+---------------+---------+-----------+----------+--------------+ PTV      Full                                                        +---------+---------------+---------+-----------+----------+--------------+ PERO     Full                                                        +---------+---------------+---------+-----------+----------+--------------+   +---------+---------------+---------+-----------+----------+--------------+ LEFT     CompressibilityPhasicitySpontaneityPropertiesThrombus Aging +---------+---------------+---------+-----------+----------+--------------+ CFV      Full           Yes      Yes                                  +---------+---------------+---------+-----------+----------+--------------+ SFJ      Full                                                        +---------+---------------+---------+-----------+----------+--------------+ FV Prox  Full                                                        +---------+---------------+---------+-----------+----------+--------------+ FV Mid   Full                                                        +---------+---------------+---------+-----------+----------+--------------+  FV DistalFull                                                        +---------+---------------+---------+-----------+----------+--------------+ PFV      Full                                                        +---------+---------------+---------+-----------+----------+--------------+ POP      Full           Yes      Yes                                 +---------+---------------+---------+-----------+----------+--------------+ PTV      Full                                                        +---------+---------------+---------+-----------+----------+--------------+ PERO                                                  Not visualized +---------+---------------+---------+-----------+----------+--------------+     Summary: BILATERAL: - No evidence of deep vein thrombosis seen in the lower extremities, bilaterally. - No evidence of superficial venous thrombosis in the lower extremities, bilaterally. - RIGHT: - A cystic structure is found in the popliteal fossa.  LEFT: - No cystic structure found in the popliteal fossa.  *See table(s) above for measurements and observations. Electronically signed by Fabienne Bruns MD on 03/28/2020 at 5:13:08 PM.    Final    MRI HEAD WITHOUT CONTRAST IMPRESSION: 1. No acute intracranial infarct or other abnormality. 2. Age-related cerebral atrophy with mild chronic small vessel ischemic disease,  stable.   Results/Tests Pending at Time of Discharge: Carotid US final read pending  Discharge Medications:  Allergies as of 04/01/2020       Reactions   Fish-derived Products Anaphylaxis   Zoloft [sertraline Hcl] Rash   Sulfamethizole Rash        Medication List     TAKE these medications    albuterol 108 (90 Base) MCG/ACT inhaler Commonly known as: VENTOLIN HFA Inhale 2 puffs into the lungs every 6 (six) hours as needed for wheezing or shortness of breath. Wait 1 minute in between each puff   aspirin 81 MG chewable tablet Chew 81 mg by mouth daily.   atorvastatin 80 MG tablet Commonly known as: LIPITOR Take 80 mg by mouth every evening.   busPIRone 10 MG tablet Commonly known as: BUSPAR Take 10 mg by mouth 3 (three) times daily.   carbidopa-levodopa 25-100 MG tablet Commonly known as: SINEMET IR Take 2 tablets by mouth 3 (three) times daily.   carvedilol 3.125 MG tablet Commonly known as: COREG Take 6.25 mg by mouth 2 (two) times daily with a meal.   Cholecalciferol 25 MCG (1000 UT) capsule Take 1,000 Units  by mouth daily.   citalopram 10 MG tablet Commonly known as: CELEXA Take 10 mg by mouth at bedtime.   clopidogrel 75 MG tablet Commonly known as: PLAVIX Take 75 mg by mouth daily.   CYANOCOBALAMIN IJ Inject as directed every 30 (thirty) days. At [facility]   docusate sodium 100 MG capsule Commonly known as: COLACE Take 1 capsule (100 mg total) by mouth as needed for mild constipation.   DULoxetine 30 MG capsule Commonly known as: CYMBALTA Take 90 mg by mouth daily.   EPINEPHrine 0.3 mg/0.3 mL Soaj injection Commonly known as: EPI-PEN Inject 0.3 mg into the muscle daily as needed for anaphylaxis.   furosemide 20 MG tablet Commonly known as: LASIX Take 3 tablets (60 mg total) by mouth daily. What changed: when to take this   gabapentin 600 MG tablet Commonly known as: NEURONTIN Take 700 mg by mouth 3 (three) times daily. Take along with a 100 mg  capsule   omeprazole 20 MG capsule Commonly known as: PRILOSEC Take 20 mg by mouth 2 (two) times daily before a meal.   potassium chloride SA 20 MEQ tablet Commonly known as: KLOR-CON Take 20 mEq by mouth daily. With food   tamsulosin 0.4 MG Caps capsule Commonly known as: FLOMAX Take 0.4 mg by mouth at bedtime.   thiamine 100 MG tablet Commonly known as: Vitamin B-1 Take 100 mg by mouth daily.        Discharge Instructions: Please refer to Patient Instructions section of EMR for full details.  Patient was counseled important signs and symptoms that should prompt return to medical care, changes in medications, dietary instructions, activity restrictions, and follow up appointments.   Follow-Up Appointments:  Contact information for after-discharge care     Destination     [facility] Preferred SNF .   Service: Skilled Nursing Contact information: [address] [place] [address] [phone]                     Littie Deeds, MD 04/01/2020, 10:58 AM PGY-1, [facility] [place] [facility]   FPTS Upper-Level Resident Addendum  I have independently interviewed and examined the patient. I have discussed the above with the original author and agree with their documentation. My edits for correction/addition/clarification are in italics. Please see also any attending notes.    Peggyann Shoals, DO PGY-3, [place] [facility] 04/01/2020 4:54 PM  FPTS Service pager: [phone] (text pages welcome through [facility])

## 2020-04-01 ENCOUNTER — Inpatient Hospital Stay (HOSPITAL_COMMUNITY): Payer: Medicare Other

## 2020-04-01 DIAGNOSIS — R931 Abnormal findings on diagnostic imaging of heart and coronary circulation: Secondary | ICD-10-CM

## 2020-04-01 DIAGNOSIS — I639 Cerebral infarction, unspecified: Secondary | ICD-10-CM

## 2020-04-01 DIAGNOSIS — G20 Parkinson's disease: Secondary | ICD-10-CM

## 2020-04-01 MED ORDER — SENNA 8.6 MG PO TABS: 1.0000 | ORAL_TABLET | Freq: Every day | ORAL | Status: DC | PRN

## 2020-04-01 MED ORDER — POLYETHYLENE GLYCOL 3350 17 G PO PACK: 17.0000 g | PACK | Freq: Every day | ORAL | Status: DC | PRN

## 2020-04-01 MED ORDER — DOCUSATE SODIUM 100 MG PO CAPS
100.0000 mg | ORAL_CAPSULE | ORAL | Status: DC | PRN
Start: 2020-04-01 — End: 2020-08-31

## 2020-04-01 MED ORDER — FUROSEMIDE 20 MG PO TABS: 60.0000 mg | ORAL_TABLET | Freq: Every day | ORAL | 0 refills | Status: DC

## 2020-04-01 NOTE — Discharge Instructions (Signed)
Dear Raymond White,   Thank you so much for allowing [place] to be part of your care!  You were admitted to [facility] for right-sided weakness concerning for stroke. Your initial CT scan and MRI did not show any signs of stroke. Since you had recurrence of your headache, blurred vision, and right-sided weakness, we were concerned for stroke again, so we got another MRI as well as an ultrasound of the carotid arteries in your neck. Your repeat MRI was normal, and your carotid ultrasound was also normal.  You were also treated for CHF exacerbation. We treated you with Lasix until your breathing improved and your leg swelling improved. You will continue on your home dose of Lasix 60 mg daily.     Take care and be well!  Family Medicine Teaching Service  [facility]  [address] [place], [place] [address] [phone]

## 2020-04-01 NOTE — Progress Notes (Signed)
NURSING PROGRESS NOTE  Raymond White [id number] Discharge Data: 04/01/2020 2:53 PM Attending Provider: Moses Manners, MD [id number], Gerrit Friends, PA-C     Draysen E Gallien to be discharged to [facility] per MD order. Discussed with Paulina, LPN at [facility] the After Visit Summary and all questions fully answered. All IV's discontinued with no bleeding noted. All belongings returned to patient for patient to take with him to [facility].  Last Vital Signs:  Blood pressure 110/82, pulse 74, temperature 97.8 F (36.6 C), temperature source Oral, resp. rate 18, height 6\' 1"  (1.854 m), weight 102.3 kg, SpO2 93 %.  Discharge Medication List Allergies as of 04/01/2020      Reactions   Fish-derived Products Anaphylaxis   Zoloft [sertraline Hcl] Rash   Sulfamethizole Rash      Medication List    TAKE these medications   albuterol 108 (90 Base) MCG/ACT inhaler Commonly known as: VENTOLIN HFA Inhale 2 puffs into the lungs every 6 (six) hours as needed for wheezing or shortness of breath. Wait 1 minute in between each puff   aspirin 81 MG chewable tablet Chew 81 mg by mouth daily.   atorvastatin 80 MG tablet Commonly known as: LIPITOR Take 80 mg by mouth every evening.   busPIRone 10 MG tablet Commonly known as: BUSPAR Take 10 mg by mouth 3 (three) times daily.   carbidopa-levodopa 25-100 MG tablet Commonly known as: SINEMET IR Take 2 tablets by mouth 3 (three) times daily.   carvedilol 3.125 MG tablet Commonly known as: COREG Take 6.25 mg by mouth 2 (two) times daily with a meal.   Cholecalciferol 25 MCG (1000 UT) capsule Take 1,000 Units by mouth daily.   citalopram 10 MG tablet Commonly known as: CELEXA Take 10 mg by mouth at bedtime.   clopidogrel 75 MG tablet Commonly known as: PLAVIX Take 75 mg by mouth daily.   CYANOCOBALAMIN IJ Inject as directed every 30 (thirty) days. At [facility]   docusate sodium 100 MG capsule Commonly known as:  COLACE Take 1 capsule (100 mg total) by mouth as needed for mild constipation.   DULoxetine 30 MG capsule Commonly known as: CYMBALTA Take 90 mg by mouth daily.   EPINEPHrine 0.3 mg/0.3 mL Soaj injection Commonly known as: EPI-PEN Inject 0.3 mg into the muscle daily as needed for anaphylaxis.   furosemide 20 MG tablet Commonly known as: LASIX Take 3 tablets (60 mg total) by mouth daily. What changed: when to take this   gabapentin 600 MG tablet Commonly known as: NEURONTIN Take 700 mg by mouth 3 (three) times daily. Take along with a 100 mg capsule   omeprazole 20 MG capsule Commonly known as: PRILOSEC Take 20 mg by mouth 2 (two) times daily before a meal.   potassium chloride SA 20 MEQ tablet Commonly known as: KLOR-CON Take 20 mEq by mouth daily. With food   tamsulosin 0.4 MG Caps capsule Commonly known as: FLOMAX Take 0.4 mg by mouth at bedtime.   thiamine 100 MG tablet Commonly known as: Vitamin B-1 Take 100 mg by mouth daily.

## 2020-04-01 NOTE — Care Management Important Message (Signed)
Important Message  Patient Details  Name: Raymond White MRN: [id number] Date of Birth: February 24, 1951   Medicare Important Message Given:  Yes     Malania Gawthrop Stefan Church 04/01/2020, 1:57 PM

## 2020-04-01 NOTE — Progress Notes (Signed)
Carotid artery duplex completed. Refer to "CV Proc" under chart review to view preliminary results.  04/01/2020 10:20 AM Eula Fried., MHA, RVT, RDCS, RDMS

## 2020-04-01 NOTE — TOC Transition Note (Signed)
Transition of Care [facility]) - CM/SW Discharge Note   Patient Details  Name: SANDY BLOUCH MRN: [id number] Date of Birth: 1951/06/20  [facility] CM/SW Contact:  Kermit Balo, RN Phone Number: 04/01/2020, 11:42 AM   Clinical Narrative:    Pt discharging to [facility] today. Pt to transport via [facility]. Pt is calling his caregiver: Carney Bern and letting her know that he is discharging and going to the SNF.  Room: 124A Number for Report: [phone]   Final next level of care: Skilled Nursing Facility Barriers to Discharge: No Barriers Identified   Patient Goals and CMS Choice   CMS Medicare.gov Compare Post Acute Care list provided to:: Patient Choice offered to / list presented to : Patient  Discharge Placement PASRR number recieved: 03/31/20            Patient chooses bed at: [facility] Patient to be transferred to facility by: [facility] Name of family member notified: Pt is calling his caregiver: Carney Bern and informing her Patient and family notified of of transfer: 04/01/20  Discharge Plan and Services In-house Referral: Clinical Social Work Discharge Planning Services: [employer] Consult Post Acute Care Choice: Skilled Nursing Facility                               Social Determinants of Health (SDOH) Interventions     Readmission Risk Interventions No flowsheet data found.

## 2020-04-01 NOTE — Progress Notes (Signed)
STROKE TEAM PROGRESS NOTE   SUBJECTIVE (INTERVAL HISTORY) No family is at the bedside. Pt sitting in chair, no complaints, no headache or dizziness today.  MRI overnight no stroke.  Carotid Doppler unremarkable.  Plan to discharge to SNF today.  OBJECTIVE Temp:  [97.8 F (36.6 C)-98.4 F (36.9 C)] 97.8 F (36.6 C) (08/13 1254) Pulse Rate:  [70-90] 74 (08/13 1254) Cardiac Rhythm: Normal sinus rhythm (08/13 0823) Resp:  [18-20] 18 (08/13 1254) BP: (98-119)/(72-85) 110/82 (08/13 1254) SpO2:  [92 %-94 %] 93 % (08/13 1254) Weight:  [102.3 kg] 102.3 kg (08/13 0339)  Recent Labs  Lab 03/28/20 0933  GLUCAP 99   Recent Labs  Lab 03/28/20 0910 03/28/20 0910 03/28/20 0941 03/29/20 0500 03/29/20 0500 03/29/20 1821 03/30/20 0535 03/31/20 0146  NA 142   < > 142 136  --  139 139 141  K 4.6   < > 4.6 3.8  --  3.6 3.6 3.8  CL 105   < > 106 101  --  101 101 99  CO2 26  --   --  24  --  27 25 31   GLUCOSE 114*   < > 109* 134*  --  118* 119* 117*  BUN 11   < > 12 13  --  15 16 13   CREATININE 0.91   < > 0.90 0.93  --  0.87 0.86 1.05  CALCIUM 8.6*   < >  --  8.9   < > 9.1 8.8* 8.6*   < > = values in this interval not displayed.   Recent Labs  Lab 03/28/20 0910 03/29/20 0500  AST 14* 14*  ALT 11 5  ALKPHOS 72 72  BILITOT 0.9 0.6  PROT 6.2* 6.1*  ALBUMIN 3.6 3.4*   Recent Labs  Lab 03/28/20 0910 03/28/20 0941 03/30/20 0535  WBC 5.4  --  5.8  NEUTROABS 3.8  --   --   HGB 14.6 13.9 14.5  HCT 45.0 41.0 44.5  MCV 102.0*  --  101.1*  PLT 153  --  149*   No results for input(s): CKTOTAL, CKMB, CKMBINDEX, TROPONINI in the last 168 hours. No results for input(s): LABPROT, INR in the last 72 hours. No results for input(s): COLORURINE, LABSPEC, PHURINE, GLUCOSEU, HGBUR, BILIRUBINUR, KETONESUR, PROTEINUR, UROBILINOGEN, NITRITE, LEUKOCYTESUR in the last 72 hours.  Invalid input(s): APPERANCEUR     Component Value Date/Time   CHOL 193 03/29/2020 1030   TRIG 156 (H) 03/29/2020 1030    HDL 50 03/29/2020 1030   CHOLHDL 3.9 03/29/2020 1030   VLDL 31 03/29/2020 1030   LDLCALC 112 (H) 03/29/2020 1030   Lab Results  Component Value Date   HGBA1C 5.6 03/29/2020      Component Value Date/Time   LABOPIA NONE DETECTED 03/28/2020 1200   COCAINSCRNUR NONE DETECTED 03/28/2020 1200   LABBENZ NONE DETECTED 03/28/2020 1200   AMPHETMU NONE DETECTED 03/28/2020 1200   THCU NONE DETECTED 03/28/2020 1200   LABBARB NONE DETECTED 03/28/2020 1200    Recent Labs  Lab 03/28/20 0910  ETH <10    I have personally reviewed the radiological images below and agree with the radiology interpretations.  DG Chest 2 View  Result Date: 03/28/2020 CLINICAL DATA:  Weakness and headache.  Shortness of breath. EXAM: CHEST - 2 VIEW COMPARISON:  March 25, 2020 and Jan 06, 2020 FINDINGS: There is a small right pleural effusion. There is elevation of the right hemidiaphragm, chronic, with right base atelectasis. Lungs otherwise are clear. Heart  is borderline enlarged with pulmonary vascularity normal. No adenopathy. No bone lesions. IMPRESSION: Stable small right pleural effusion with right base atelectasis. Mild elevation of the right hemidiaphragm is stable. Lungs elsewhere clear. Stable cardiac prominence. Electronically Signed   By: Bretta Bang III M.D.   On: 03/28/2020 10:36   DG Chest 2 View  Result Date: 03/25/2020 CLINICAL DATA:  CHF.  Shortness of breath. EXAM: CHEST - 2 VIEW COMPARISON:  CT 06/24/2019.  Chest x-ray 01/06/2020, 03/25/2019. FINDINGS: Mediastinum hilar structures normal. Persistent right base atelectasis/scarring. Stable right pleural thickening consistent scarring. No pneumothorax. Stable elevation right hemidiaphragm. Interposition of the colon under the right hemidiaphragm again noted. Postsurgical changes right lower rib again noted. Prior cervical spine fusion. Degenerative change thoracic spine again noted. IMPRESSION: Stable right base pleural-parenchymal thickening  consistent with scarring. Stable elevation right hemidiaphragm. No acute abnormality identified. Electronically Signed   By: Maisie Fus  Register   On: 03/25/2020 09:18   MR ANGIO HEAD WO CONTRAST  Result Date: 03/28/2020 CLINICAL DATA:  Neuro deficit. EXAM: MRI HEAD WITHOUT CONTRAST MRA HEAD WITHOUT CONTRAST TECHNIQUE: Multiplanar, multiecho pulse sequences of the brain and surrounding structures were obtained without intravenous contrast. Angiographic images of the head were obtained using MRA technique without contrast. COMPARISON:  04/07/2018 MRI head. 03/28/2020 head CT and prior. FINDINGS: MRI HEAD FINDINGS Brain: Generalized cerebral atrophy with ex vacuo dilatation. Scattered T2 hyperintense foci involving the periventricular and subcortical white matter are nonspecific however commonly associated with chronic microvascular ischemic changes. No acute infarct or intracranial hemorrhage. No midline shift, ventriculomegaly or extra-axial fluid collection. No mass lesion. Vascular: See MRA head. Skull and upper cervical spine: Normal marrow signal. Sinuses/Orbits: Normal orbits. Mild pansinus mucosal thickening. No mastoid effusion. Other: None. MRA HEAD FINDINGS Anterior circulation: No significant stenosis, proximal occlusion, aneurysm, or vascular malformation. Posterior circulation: Dominant left vertebral artery. Right vertebral artery terminates as PICA. No significant stenosis, proximal occlusion, aneurysm, or vascular malformation. Venous sinuses: No evidence of thrombosis. Anatomic variants: Fetal origin of the bilateral PCAs. IMPRESSION: 1. No acute intracranial abnormality. 2. Generalized cerebral atrophy and chronic microvascular ischemic changes. 3. No high-grade vast narrowing, proximal occlusion or aneurysm. Electronically Signed   By: Stana Bunting M.D.   On: 03/28/2020 10:40   MR BRAIN WO CONTRAST  Result Date: 03/31/2020 CLINICAL DATA:  Follow-up examination for acute stroke. EXAM: MRI  HEAD WITHOUT CONTRAST TECHNIQUE: Multiplanar, multiecho pulse sequences of the brain and surrounding structures were obtained without intravenous contrast. COMPARISON:  Recent MRI from 03/28/2020. FINDINGS: Brain: Examination performed in a limited fashion with only diffusion and FLAIR sequences performed. Age-related cerebral atrophy with mild chronic small vessel ischemic disease again noted. Diffusion-weighted imaging demonstrates no evidence for acute or interval infarction. Gray-white matter differentiation maintained. No encephalomalacia to suggest chronic cortical infarction. No mass lesion, midline shift or mass effect. No hydrocephalus or extra-axial fluid collection. Vascular: Major intracranial vascular flow voids grossly maintained on this limited exam. Skull and upper cervical spine: Negative. Sinuses/Orbits: Globes and orbital soft tissues within normal limits. Paranasal sinuses remain largely clear. No mastoid effusion. Other: None. IMPRESSION: 1. No acute intracranial infarct or other abnormality. 2. Age-related cerebral atrophy with mild chronic small vessel ischemic disease, stable. Electronically Signed   By: Rise Mu M.D.   On: 03/31/2020 20:16   MR BRAIN WO CONTRAST  Result Date: 03/28/2020 CLINICAL DATA:  Neuro deficit. EXAM: MRI HEAD WITHOUT CONTRAST MRA HEAD WITHOUT CONTRAST TECHNIQUE: Multiplanar, multiecho pulse sequences of the brain and surrounding  structures were obtained without intravenous contrast. Angiographic images of the head were obtained using MRA technique without contrast. COMPARISON:  04/07/2018 MRI head. 03/28/2020 head CT and prior. FINDINGS: MRI HEAD FINDINGS Brain: Generalized cerebral atrophy with ex vacuo dilatation. Scattered T2 hyperintense foci involving the periventricular and subcortical white matter are nonspecific however commonly associated with chronic microvascular ischemic changes. No acute infarct or intracranial hemorrhage. No midline shift,  ventriculomegaly or extra-axial fluid collection. No mass lesion. Vascular: See MRA head. Skull and upper cervical spine: Normal marrow signal. Sinuses/Orbits: Normal orbits. Mild pansinus mucosal thickening. No mastoid effusion. Other: None. MRA HEAD FINDINGS Anterior circulation: No significant stenosis, proximal occlusion, aneurysm, or vascular malformation. Posterior circulation: Dominant left vertebral artery. Right vertebral artery terminates as PICA. No significant stenosis, proximal occlusion, aneurysm, or vascular malformation. Venous sinuses: No evidence of thrombosis. Anatomic variants: Fetal origin of the bilateral PCAs. IMPRESSION: 1. No acute intracranial abnormality. 2. Generalized cerebral atrophy and chronic microvascular ischemic changes. 3. No high-grade vast narrowing, proximal occlusion or aneurysm. Electronically Signed   By: Stana Bunting M.D.   On: 03/28/2020 10:40   ECHOCARDIOGRAM COMPLETE  Result Date: 03/29/2020    ECHOCARDIOGRAM REPORT   Patient Name:   Raymond White Date of Exam: 03/29/2020 Medical Rec #:  [id number]         Height:       73.0 in Accession #:    [id number]        Weight:       234.0 lb Date of Birth:  [birth year]         BSA:          2.300 m Patient Age:    69 years          BP:           120/78 mmHg Patient Gender: M                 HR:           69 bpm. Exam Location:  Inpatient Procedure: 2D Echo, Cardiac Doppler and Color Doppler Indications:    Dyspnea  History:        Patient has no prior history of Echocardiogram examinations.                 CHF, CAD, Stroke; Signs/Symptoms:Dyspnea.  Sonographer:    Lavenia Atlas Referring Phys: [phone] SCOTT GOLDSTON IMPRESSIONS  1. Left ventricular ejection fraction, by estimation, is 55 to 60%. The left ventricle has normal function. The left ventricle has no regional wall motion abnormalities. Left ventricular diastolic parameters are consistent with Grade I diastolic dysfunction (impaired relaxation).  2. Right  ventricular systolic function is normal. The right ventricular size is normal. There is normal pulmonary artery systolic pressure.  3. Left atrial size was mildly dilated.  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 No evidence of mitral valve regurgitation.  5. The aortic valve is abnormal. Aortic valve regurgitation is mild.  6. The inferior vena cava is normal in size with greater than 50% respiratory variability, suggesting right atrial pressure of 3 mmHg. FINDINGS  Left Ventricle: Left ventricular ejection fraction, by estimation, is 55 to 60%. The left ventricle has normal function. The left ventricle has no regional wall motion abnormalities. The left ventricular internal cavity size was normal in size. There is  no left ventricular hypertrophy. Left ventricular diastolic parameters are consistent with Grade I diastolic dysfunction (impaired relaxation). Normal left ventricular filling pressure. Right Ventricle: The right ventricular  size is normal. No increase in right ventricular wall thickness. Right ventricular systolic function is normal. There is normal pulmonary artery systolic pressure. The tricuspid regurgitant velocity is 2.28 m/s, and  with an assumed right atrial pressure of 3 mmHg, the estimated right ventricular systolic pressure is 23.8 mmHg. Left Atrium: Left atrial size was mildly dilated. Right Atrium: Right atrial size was not well visualized. Pericardium: There is no evidence of pericardial effusion. Mitral Valve: The mitral valve is myxomatous. There is mild late systolic prolapse of the middle scallop of the posterior leaflet of the mitral valve. There is mild thickening of the mitral valve leaflet(s). No evidence of mitral valve regurgitation. Tricuspid Valve: The tricuspid valve is normal in structure. Tricuspid valve regurgitation is trivial. Aortic Valve: The aortic valve is abnormal. Aortic valve regurgitation is mild. Pulmonic Valve: The pulmonic valve was not well visualized. Pulmonic  valve regurgitation is trivial. Aorta: The aortic root is normal in size and structure. Venous: The inferior vena cava is normal in size with greater than 50% respiratory variability, suggesting right atrial pressure of 3 mmHg. IAS/Shunts: No atrial level shunt detected by color flow Doppler.  LEFT VENTRICLE PLAX 2D LVIDd:         4.60 cm  Diastology LVIDs:         3.20 cm  LV e' lateral:   7.51 cm/s LV PW:         1.10 cm  LV E/e' lateral: 5.0 LV IVS:        1.20 cm  LV e' medial:    7.07 cm/s LVOT diam:     2.40 cm  LV E/e' medial:  5.3 LV SV:         57 LV SV Index:   25 LVOT Area:     4.52 cm  RIGHT VENTRICLE RV Basal diam:  3.70 cm RV S prime:     7.18 cm/s LEFT ATRIUM             Index       RIGHT ATRIUM           Index LA diam:        4.10 cm 1.78 cm/m  RA Area:     20.90 cm LA Vol (A2C):   38.6 ml 16.78 ml/m RA Volume:   63.80 ml  27.74 ml/m LA Vol (A4C):   55.5 ml 24.13 ml/m LA Biplane Vol: 45.7 ml 19.87 ml/m  AORTIC VALVE LVOT Vmax:   77.70 cm/s LVOT Vmean:  45.200 cm/s LVOT VTI:    0.127 m  AORTA Ao Root diam: 3.60 cm MITRAL VALVE               TRICUSPID VALVE MV Area (PHT): 2.56 cm    TR Peak grad:   20.8 mmHg MV Decel Time: 296 msec    TR Vmax:        228.00 cm/s MV E velocity: 37.30 cm/s MV A velocity: 68.10 cm/s  SHUNTS MV E/A ratio:  0.55        Systemic VTI:  0.13 m                            Systemic Diam: 2.40 cm Rachelle Hora Croitoru MD Electronically signed by Thurmon Fair MD Signature Date/Time: 03/29/2020/12:26:36 PM    Final    CT HEAD CODE STROKE WO CONTRAST  Result Date: 03/28/2020 CLINICAL DATA:  Code stroke. EXAM: CT HEAD WITHOUT CONTRAST TECHNIQUE:  Contiguous axial images were obtained from the base of the skull through the vertex without intravenous contrast. COMPARISON:  None. FINDINGS: Brain: There is no acute intracranial hemorrhage, mass effect, or edema. Gray-white differentiation is preserved. Prominence of the ventricles and sulci reflects stable parenchymal volume loss.  Patchy hypoattenuation in the supratentorial white matter is nonspecific but probably reflects stable chronic microvascular ischemic changes. There is no extra-axial fluid collection. Vascular: No definite hyperdense vessel. Skull: Unremarkable. Sinuses/Orbits: No acute abnormality. Other: Mastoid air cells are clear. ASPECTS ([place] Stroke Program Early CT Score) - Ganglionic level infarction (caudate, lentiform nuclei, internal capsule, insula, M1-M3 cortex): 7 - Supraganglionic infarction (M4-M6 cortex): 3 Total score (0-10 with 10 being normal): 10 IMPRESSION: No acute intracranial hemorrhage or evidence of acute infarction. ASPECT score is 10. These results were communicated to Dr. Iver Nestle at 9:44 amon 8/9/2021by text page via the [facility] messaging system. Electronically Signed   By: Guadlupe Spanish M.D.   On: 03/28/2020 09:50   VAS US CAROTID  Result Date: 04/01/2020 Carotid Arterial Duplex Study Indications:       CVA. Risk Factors:      Hypertension. Comparison Study:  No prior study Performing Technologist: Gertie Fey MHA, RDMS, RVT, RDCS  Examination Guidelines: A complete evaluation includes B-mode imaging, spectral Doppler, color Doppler, and power Doppler as needed of all accessible portions of each vessel. Bilateral testing is considered an integral part of a complete examination. Limited examinations for reoccurring indications may be performed as noted.  Right Carotid Findings: +----------+--------+--------+--------+------------------+--------+           PSV cm/sEDV cm/sStenosisPlaque DescriptionComments +----------+--------+--------+--------+------------------+--------+ CCA Prox  73      13                                         +----------+--------+--------+--------+------------------+--------+ CCA Distal78      15                                         +----------+--------+--------+--------+------------------+--------+ ICA Prox  79      18                                          +----------+--------+--------+--------+------------------+--------+ ICA Distal52      15                                         +----------+--------+--------+--------+------------------+--------+ ECA       98      9                                          +----------+--------+--------+--------+------------------+--------+ +----------+--------+-------+----------------+-------------------+           PSV cm/sEDV cmsDescribe        Arm Pressure (mmHG) +----------+--------+-------+----------------+-------------------+ [id number]             Multiphasic, WNL                    +----------+--------+-------+----------------+-------------------+ +---------+--------+--+--------+--+---------+ VertebralPSV cm/s35EDV cm/s11Antegrade +---------+--------+--+--------+--+---------+  Left Carotid Findings: +----------+--------+--------+--------+-----------------------+--------+           PSV cm/sEDV cm/sStenosisPlaque Description     Comments +----------+--------+--------+--------+-----------------------+--------+ CCA Prox  108     24                                              +----------+--------+--------+--------+-----------------------+--------+ CCA Distal89      16                                              +----------+--------+--------+--------+-----------------------+--------+ ICA Prox  71      19              smooth and heterogenous         +----------+--------+--------+--------+-----------------------+--------+ ICA Distal73      22                                              +----------+--------+--------+--------+-----------------------+--------+ ECA       106     7                                               +----------+--------+--------+--------+-----------------------+--------+ +----------+--------+--------+----------------+-------------------+           PSV cm/sEDV cm/sDescribe        Arm Pressure (mmHG)  +----------+--------+--------+----------------+-------------------+ [id number]             Multiphasic, WNL                    +----------+--------+--------+----------------+-------------------+ +---------+--------+--+--------+--+---------+ VertebralPSV cm/s62EDV cm/s14Antegrade +---------+--------+--+--------+--+---------+   Summary: Right Carotid: Velocities in the right ICA are consistent with a 1-39% stenosis. Left Carotid: Velocities in the left ICA are consistent with a 1-39% stenosis. Vertebrals:  Bilateral vertebral arteries demonstrate antegrade flow. Subclavians: Normal flow hemodynamics were seen in bilateral subclavian              arteries. *See table(s) above for measurements and observations.     Preliminary    VAS [place] LOWER EXTREMITY VENOUS (DVT)  Result Date: 03/28/2020  Lower Venous DVTStudy Indications: Swelling.  Comparison Study: no prior Performing Technologist: Blanch Media RVS  Examination Guidelines: A complete evaluation includes B-mode imaging, spectral Doppler, color Doppler, and power Doppler as needed of all accessible portions of each vessel. Bilateral testing is considered an integral part of a complete examination. Limited examinations for reoccurring indications may be performed as noted. The reflux portion of the exam is performed with the patient in reverse Trendelenburg.  +---------+---------------+---------+-----------+----------+--------------+ RIGHT    CompressibilityPhasicitySpontaneityPropertiesThrombus Aging +---------+---------------+---------+-----------+----------+--------------+ CFV      Full           Yes      Yes                                 +---------+---------------+---------+-----------+----------+--------------+ SFJ      Full                                                        +---------+---------------+---------+-----------+----------+--------------+  FV Prox  Full                                                         +---------+---------------+---------+-----------+----------+--------------+ FV Mid   Full                                                        +---------+---------------+---------+-----------+----------+--------------+ FV DistalFull                                                        +---------+---------------+---------+-----------+----------+--------------+ PFV      Full                                                        +---------+---------------+---------+-----------+----------+--------------+ POP      Full           Yes      Yes                                 +---------+---------------+---------+-----------+----------+--------------+ PTV      Full                                                        +---------+---------------+---------+-----------+----------+--------------+ PERO     Full                                                        +---------+---------------+---------+-----------+----------+--------------+   +---------+---------------+---------+-----------+----------+--------------+ LEFT     CompressibilityPhasicitySpontaneityPropertiesThrombus Aging +---------+---------------+---------+-----------+----------+--------------+ CFV      Full           Yes      Yes                                 +---------+---------------+---------+-----------+----------+--------------+ SFJ      Full                                                        +---------+---------------+---------+-----------+----------+--------------+ FV Prox  Full                                                        +---------+---------------+---------+-----------+----------+--------------+  FV Mid   Full                                                        +---------+---------------+---------+-----------+----------+--------------+ FV DistalFull                                                         +---------+---------------+---------+-----------+----------+--------------+ PFV      Full                                                        +---------+---------------+---------+-----------+----------+--------------+ POP      Full           Yes      Yes                                 +---------+---------------+---------+-----------+----------+--------------+ PTV      Full                                                        +---------+---------------+---------+-----------+----------+--------------+ PERO                                                  Not visualized +---------+---------------+---------+-----------+----------+--------------+     Summary: BILATERAL: - No evidence of deep vein thrombosis seen in the lower extremities, bilaterally. - No evidence of superficial venous thrombosis in the lower extremities, bilaterally. - RIGHT: - A cystic structure is found in the popliteal fossa.  LEFT: - No cystic structure found in the popliteal fossa.  *See table(s) above for measurements and observations. Electronically signed by Fabienne Bruns MD on 03/28/2020 at 5:13:08 PM.    Final     PHYSICAL EXAM  Temp:  [97.8 F (36.6 C)-98.4 F (36.9 C)] 97.8 F (36.6 C) (08/13 1254) Pulse Rate:  [70-90] 74 (08/13 1254) Resp:  [18-20] 18 (08/13 1254) BP: (98-119)/(72-85) 110/82 (08/13 1254) SpO2:  [92 %-94 %] 93 % (08/13 1254) Weight:  [102.3 kg] 102.3 kg (08/13 0339)  General - Well nourished, well developed, in no apparent distress.  Ophthalmologic - fundi not visualized due to noncooperation.  Cardiovascular - Regular rhythm and rate.  Mental Status -  Level of arousal and orientation to time, place, and person were intact. Language including expression, naming, repetition, comprehension was assessed and found intact. Fund of Knowledge was assessed and was intact.  Cranial Nerves II - XII - II - Visual field intact OU. III, IV, VI - Extraocular movements  intact. V - Facial sensation decreased on the right, about 80% of the left. VII - mild right nasolabial fold flattening. VIII - Hearing & vestibular intact  bilaterally. X - Palate elevates symmetrically. XI - Chin turning & shoulder shrug intact bilaterally. XII - Tongue protrusion intact.  Motor Strength - The patient's strength was normal in RUE and RLE extremities and pronator drift was absent. However, he had LUE and LLE 4+/5 with lack of effort and give away weakness. Bulk was normal and fasciculations were absent.   Motor Tone - Muscle tone was assessed at the neck and appendages and was normal.  Reflexes - The patient's reflexes were symmetrical in all extremities and he had no pathological reflexes.  Sensory - Light touch, temperature/pinprick were assessed and were decreased on the right, about 80% of the left.    Coordination - The patient had normal movements in the hands with no ataxia or dysmetria.  Tremor was absent.  Gait and Station - deferred.   ASSESSMENT/PLAN Raymond White is a 70 y.o. male with history of CHF, HTN, HLD, PD, LBP admitted for HA, right blurry vision, right sided weakness and speech difficulty. No tPA given due to no stroke on MRI.    Right sided weakness - ?? Functional   CT no acute abnormalities  MRI  No acute infarct  MRA  Unremarkable  Repeat MRI again no acute infarct  2D Echo  EF 55-60%  LE venous doppler neg for DVT  CUS unremarkable  LDL 112  HgbA1c 5.6  lovenox for VTE prophylaxis  aspirin 81 mg daily and clopidogrel 75 mg daily prior to admission, now on aspirin 81 mg daily and clopidogrel 75 mg daily. Continue on discharge  Patient counseled to be compliant with his antithrombotic medications  Ongoing aggressive stroke risk factor management  Therapy recommendations:  SNF  Disposition: Discharge to SNF today  Hypertension . Stable  Long term BP goal normotensive  Hyperlipidemia  Home meds:  lipitor 80    LDL 112, goal < 70  Now on lipitor 80  Continue statin at discharge  Other Stroke Risk Factors  Advanced age  CHF  "TIA" in the past  Other Active Problems  PD on sinemet  Hospital day # 3  Neurology will sign off. Please call with questions. Pt will follow up with stroke clinic NP at [facility] [place] in about 4 weeks. Thanks for the consult.   Marvel Plan, MD PhD Stroke Neurology 04/01/2020 6:38 PM    To contact Stroke Continuity provider, please refer to [email or web address]. After hours, contact General Neurology

## 2020-06-21 ENCOUNTER — Other Ambulatory Visit: Payer: Self-pay

## 2020-06-21 ENCOUNTER — Encounter (HOSPITAL_COMMUNITY): Payer: Self-pay

## 2020-06-21 ENCOUNTER — Emergency Department (HOSPITAL_COMMUNITY): Payer: Medicare Other

## 2020-06-21 ENCOUNTER — Emergency Department (HOSPITAL_COMMUNITY)
Admission: EM | Admit: 2020-06-21 | Discharge: 2020-06-21 | Disposition: A | Discharge status: Home / Self Care | Payer: Medicare Other | Attending: Emergency Medicine | Admitting: Emergency Medicine

## 2020-06-21 DIAGNOSIS — R200 Anesthesia of skin: Secondary | ICD-10-CM | POA: Insufficient documentation

## 2020-06-21 DIAGNOSIS — G20 Parkinson's disease: Secondary | ICD-10-CM | POA: Diagnosis not present

## 2020-06-21 DIAGNOSIS — R29818 Other symptoms and signs involving the nervous system: Secondary | ICD-10-CM | POA: Diagnosis not present

## 2020-06-21 DIAGNOSIS — I110 Hypertensive heart disease with heart failure: Secondary | ICD-10-CM | POA: Diagnosis not present

## 2020-06-21 DIAGNOSIS — Y92512 Supermarket, store or market as the place of occurrence of the external cause: Secondary | ICD-10-CM | POA: Diagnosis not present

## 2020-06-21 DIAGNOSIS — R531 Weakness: Secondary | ICD-10-CM | POA: Diagnosis not present

## 2020-06-21 DIAGNOSIS — Z7982 Long term (current) use of aspirin: Secondary | ICD-10-CM | POA: Diagnosis not present

## 2020-06-21 DIAGNOSIS — W208XXA Other cause of strike by thrown, projected or falling object, initial encounter: Secondary | ICD-10-CM | POA: Insufficient documentation

## 2020-06-21 DIAGNOSIS — S39012A Strain of muscle, fascia and tendon of lower back, initial encounter: Secondary | ICD-10-CM | POA: Insufficient documentation

## 2020-06-21 DIAGNOSIS — I509 Heart failure, unspecified: Secondary | ICD-10-CM | POA: Insufficient documentation

## 2020-06-21 DIAGNOSIS — S3992XA Unspecified injury of lower back, initial encounter: Secondary | ICD-10-CM | POA: Diagnosis present

## 2020-06-21 DIAGNOSIS — Z79899 Other long term (current) drug therapy: Secondary | ICD-10-CM | POA: Diagnosis not present

## 2020-06-21 LAB — CBC WITH DIFFERENTIAL/PLATELET
Abs Immature Granulocytes: 0.02 10*3/uL (ref 0.00–0.07)
Basophils Absolute: 0 10*3/uL (ref 0.0–0.1)
Basophils Relative: 0 %
Eosinophils Absolute: 0.1 10*3/uL (ref 0.0–0.5)
Eosinophils Relative: 1 %
HCT: 46.7 % (ref 39.0–52.0)
Hemoglobin: 15.5 g/dL (ref 13.0–17.0)
Immature Granulocytes: 0 %
Lymphocytes Relative: 22 %
Lymphs Abs: 1.4 10*3/uL (ref 0.7–4.0)
MCH: 33.6 pg (ref 26.0–34.0)
MCHC: 33.2 g/dL (ref 30.0–36.0)
MCV: 101.3 fL — ABNORMAL HIGH (ref 80.0–100.0)
Monocytes Absolute: 0.7 10*3/uL (ref 0.1–1.0)
Monocytes Relative: 12 %
Neutro Abs: 4 10*3/uL (ref 1.7–7.7)
Neutrophils Relative %: 65 %
Platelets: 177 10*3/uL (ref 150–400)
RBC: 4.61 MIL/uL (ref 4.22–5.81)
RDW: 12.8 % (ref 11.5–15.5)
WBC: 6.1 10*3/uL (ref 4.0–10.5)
nRBC: 0 % (ref 0.0–0.2)

## 2020-06-21 LAB — BASIC METABOLIC PANEL
Anion gap: 10 (ref 5–15)
BUN: 13 mg/dL (ref 8–23)
CO2: 31 mmol/L (ref 22–32)
Calcium: 8.5 mg/dL — ABNORMAL LOW (ref 8.9–10.3)
Chloride: 100 mmol/L (ref 98–111)
Creatinine, Ser: 0.94 mg/dL (ref 0.61–1.24)
GFR, Estimated: >60 mL/min (ref >60)
Glucose, Bld: 118 mg/dL — ABNORMAL HIGH (ref 70–99)
Potassium: 3.8 mmol/L (ref 3.5–5.1)
Sodium: 141 mmol/L (ref 135–145)

## 2020-06-21 MED ORDER — MORPHINE SULFATE (PF) 4 MG/ML IV SOLN
4.0000 mg | Freq: Once | INTRAVENOUS | Status: AC
  Administered 2020-06-21: 4 mg via INTRAVENOUS
  Filled 2020-06-21: qty 1

## 2020-06-21 MED ORDER — OXYCODONE-ACETAMINOPHEN 5-325 MG PO TABS
1.0000 | ORAL_TABLET | Freq: Four times a day (QID) | ORAL | 0 refills | Status: AC | PRN
Start: 2020-06-21 — End: 2020-06-24

## 2020-06-21 MED ORDER — HYDROMORPHONE HCL 1 MG/ML IJ SOLN
0.5000 mg | Freq: Once | INTRAMUSCULAR | Status: AC
  Administered 2020-06-21: 0.5 mg via INTRAVENOUS
  Filled 2020-06-21: qty 1

## 2020-06-21 NOTE — ED Provider Notes (Signed)
69 year old male presenting to ER with concern for acute on chronic back pain in the setting of recent injury at [facility].  MRI ordered to further evaluate.  3:00 PM Received signout from Dr. Charm Barges, if pain controlled, MRI negative for acute pathology then likely discharge home  3:48 PM Reassessed patient, pain improved, reviewed results, reviewed return precautions and stressed importance of following up with his primary doctor and his prior spine surgeon   Milagros Loll, MD 06/21/20 1549

## 2020-06-21 NOTE — Discharge Instructions (Signed)
Please follow-up both with your primary care doctor as well as your spine surgeon regarding your back pain.  Take the prescribed Percocet as needed for breakthrough pain.  Note this can make you drowsy and should not be taken while driving or operating heavy machinery.  Recommend resting and refraining from heavy lifting for the next couple days.

## 2020-06-21 NOTE — ED Triage Notes (Addendum)
Pt BIB [facility] due to metal shelves falling on him yesterday afternoon while at [employer]. Pt c/o lower back pain, tenderness to palpation, no deformity. Pt reports significant hx of chronic back pain   BP 154/94 HR 97 97% RA  RR 18 98.7  100 mcg Fentanyl IVP 20g LFA

## 2020-06-21 NOTE — ED Notes (Signed)
Pt to MRI

## 2020-06-21 NOTE — ED Provider Notes (Signed)
[facility]   CSN: [id number] Arrival date & time: 06/21/20  [id number]     History Chief Complaint  Patient presents with  . Back Pain    Raymond White is a 69 y.o. male.  He has a history of chronic back issues and has had surgeries for that.  [facility]  He said he was at [employer] [employer] yesterday when a metal shelving fell on him striking him in the low back.  He was okay yesterday but woke up with significant more pain.  He has spasms that radiate down both legs more right than left and says he has decreased sensation to painful stimuli on his legs.  No bowel or bladder incontinence.  The history is provided by the patient.  Back Pain Location:  Lumbar spine Quality:  Stabbing Radiates to:  L thigh and R thigh Pain severity:  Severe Pain is:  Same all the time Onset quality:  Gradual Timing:  Constant Progression:  Worsening Chronicity:  Recurrent Context: recent injury   Relieved by:  Nothing Worsened by:  Bending and ambulation Ineffective treatments:  Bed rest Associated symptoms: numbness and weakness   Associated symptoms: no abdominal pain, no bladder incontinence, no bowel incontinence, no chest pain, no dysuria and no fever        Past Medical History:  Diagnosis Date  . Acute cystitis with hematuria   . Acute midline low back pain with sciatica   . CHF (congestive heart failure) (HCC)   . Chronic heart failure with preserved ejection fraction (HCC)   . DDD (degenerative disc disease), lumbar   . Depressive disorder   . Drug induced constipation   . GERD (gastroesophageal reflux disease)   . History of TIA (transient ischemic attack)   . Hypertension   . Pancreatitis   . Parkinson's disease (HCC) 2015  . Stroke (HCC)   . Uncomplicated alcohol dependence (HCC)   . Urinary incontinence     Patient Active Problem List   Diagnosis Date Noted  . Acute congestive heart failure (HCC)  03/29/2020  . Acute right-sided weakness 03/28/2020  . Dyspnea on exertion 03/28/2020  . Respiratory failure with hypoxia (HCC) 10/22/2018  . Acute respiratory failure with hypoxia (HCC) 10/21/2018  . Influenza A 10/21/2018  . Parkinson disease (HCC) 10/21/2018  . DDD (degenerative disc disease), lumbar 12/15/2017  . Klebsiella cystitis 12/15/2017  . 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  . Chronic heart failure with preserved ejection fraction (HCC) 12/15/2017  . GERD (gastroesophageal reflux disease) 12/15/2017  . Hypertension 12/15/2017  . Depressive disorder 12/15/2017  . Anxiety 12/15/2017    Past Surgical History:  Procedure Laterality Date  . APPENDECTOMY    . BACK SURGERY     x5  . CHOLECYSTECTOMY    . COLONOSCOPY    . ESOPHAGOGASTRODUODENOSCOPY    . KNEE ARTHROSCOPY    . neck fusion         Family History  Problem Relation Age of Onset  . Hyperlipidemia Mother   . Colon cancer Father     Social History   Tobacco Use  . Smoking status: Never Smoker  . Smokeless tobacco: Never Used  Vaping Use  . Vaping Use: Never used  Substance Use Topics  . Alcohol use: Not Currently    Comment: Vodka  . Drug use: Never    Home Medications Prior to Admission medications   Medication Sig Start Date End Date Taking? Authorizing Provider  albuterol (PROVENTIL  HFA;VENTOLIN HFA) 108 (90 Base) MCG/ACT inhaler Inhale 2 puffs into the lungs every 6 (six) hours as needed for wheezing or shortness of breath. Wait 1 minute in between each puff    [provider]  aspirin 81 MG chewable tablet Chew 81 mg by mouth daily.    [provider]  atorvastatin (LIPITOR) 80 MG tablet Take 80 mg by mouth every evening.    [provider]  busPIRone (BUSPAR) 10 MG tablet Take 10 mg by mouth 3 (three) times daily.    [provider]  carbidopa-levodopa (SINEMET IR) 25-100 MG tablet Take 2 tablets by mouth 3 (three) times daily.     [provider]   carvedilol (COREG) 3.125 MG tablet Take 6.25 mg by mouth 2 (two) times daily with a meal.    [provider]  Cholecalciferol 25 MCG (1000 UT) capsule Take 1,000 Units by mouth daily.     [provider]  citalopram (CELEXA) 10 MG tablet Take 10 mg by mouth at bedtime.    [provider]  clopidogrel (PLAVIX) 75 MG tablet Take 75 mg by mouth daily.    [provider]  CYANOCOBALAMIN IJ Inject as directed every 30 (thirty) days. At [facility]    [provider]  docusate sodium (COLACE) 100 MG capsule Take 1 capsule (100 mg total) by mouth as needed for mild constipation. 04/01/20   Littie Deeds, MD  DULoxetine (CYMBALTA) 30 MG capsule Take 90 mg by mouth daily.     [provider]  EPINEPHrine 0.3 mg/0.3 mL IJ SOAJ injection Inject 0.3 mg into the muscle daily as needed for anaphylaxis.    [provider]  furosemide (LASIX) 20 MG tablet Take 3 tablets (60 mg total) by mouth daily. 04/01/20   Littie Deeds, MD  gabapentin (NEURONTIN) 600 MG tablet Take 700 mg by mouth 3 (three) times daily. Take along with a 100 mg capsule    [provider]  omeprazole (PRILOSEC) 20 MG capsule Take 20 mg by mouth 2 (two) times daily before a meal.     [provider]  potassium chloride SA (K-DUR,KLOR-CON) 20 MEQ tablet Take 20 mEq by mouth daily. With food    [provider]  tamsulosin (FLOMAX) 0.4 MG CAPS capsule Take 0.4 mg by mouth at bedtime.    [provider]  thiamine (VITAMIN B-1) 100 MG tablet Take 100 mg by mouth daily.    [provider]    Allergies    Fish-derived products, Zoloft [sertraline hcl], and Sulfamethizole  Review of Systems   Review of Systems  Constitutional: Negative for fever.  HENT: Negative for sore throat.   Eyes: Negative for visual disturbance.  Respiratory: Positive for shortness of breath (baseline chf).   Cardiovascular: Negative for chest pain.  Gastrointestinal:  Negative for abdominal pain and bowel incontinence.  Genitourinary: Negative for bladder incontinence and dysuria.  Musculoskeletal: Positive for back pain.  Skin: Negative for rash.  Neurological: Positive for weakness and numbness.    Physical Exam Updated Vital Signs BP 123/82 (BP Location: Right Arm)   Pulse 81   Temp 98.2 F (36.8 C) (Oral)   Resp 16   SpO2 94%   Physical Exam Vitals and nursing note reviewed.  Constitutional:      Appearance: He is well-developed.  HENT:     Head: Normocephalic and atraumatic.  Eyes:     Conjunctiva/sclera: Conjunctivae normal.  Cardiovascular:     Rate and Rhythm: Normal  rate and regular rhythm.     Heart sounds: No murmur heard.   Pulmonary:     Effort: Pulmonary effort is normal. No respiratory distress.     Breath sounds: Normal breath sounds.  Abdominal:     Palpations: Abdomen is soft.     Tenderness: There is no abdominal tenderness.  Musculoskeletal:        General: Tenderness present.     Cervical back: Neck supple.     Comments: He has lumbar and paralumbar tenderness.  Skin:    General: Skin is warm and dry.  Neurological:     Mental Status: He is alert.     Sensory: Sensory deficit present.     Motor: Weakness present.     Comments: He is just barely able to lift his legs off the bed past gravity but has to stop secondary to pain.  Subjective decrease sensation bilateral lower extremities from the waist down.     ED Results / Procedures / Treatments   Labs (all labs ordered are listed, but only abnormal results are displayed) Labs Reviewed  BASIC METABOLIC PANEL - Abnormal; Notable for the following components:      Result Value   Glucose, Bld 118 (*)    Calcium 8.5 (*)    All other components within normal limits  CBC WITH DIFFERENTIAL/PLATELET - Abnormal; Notable for the following components:   MCV 101.3 (*)    All other components within normal limits    EKG None  Radiology DG Lumbar Spine  Complete  Result Date: 06/21/2020 CLINICAL DATA:  Back pain. Additional history provided: Mid sagittal lumbar pain with shooting pains into both legs more severe on the right side for 2 days. Patient reports a shelf fell on him last night, 5 previous back surgeries. EXAM: LUMBAR SPINE - COMPLETE 4+ VIEW COMPARISON:  Lumbar spine MRI 03/18/2018. CT abdomen/pelvis 11/22/2017. FINDINGS: Five lumbar vertebrae. Mild lumbar dextrocurvature. L2-L3 and L3-L4 grade 1 retrolisthesis. No lumbar compression deformity. Moderate/advanced disc space narrowing at T11-T12, T12-L1 and throughout the lumbar spine. Multilevel degenerative endplate irregularity and degenerative endplate sclerosis, as well as endplate spurring. Multilevel ventral osteophytes. Facet arthrosis throughout the lumbar spine, greatest at L5-S1. Surgical clips within the upper abdomen. IMPRESSION: No lumbar compression fracture. Lumbar dextrocurvature.  L2-L3 and L3-L4 grade 1 retrolisthesis. Advanced spondylosis of the lumbar and visualized lower thoracic spine, as described. Electronically Signed   By: Jackey Loge DO   On: 06/21/2020 14:21   MR LUMBAR SPINE WO CONTRAST  Result Date: 06/21/2020 CLINICAL DATA:  Low back pain, trauma. EXAM: MRI LUMBAR SPINE WITHOUT CONTRAST TECHNIQUE: Multiplanar, multisequence MR imaging of the lumbar spine was performed. No intravenous contrast was administered. COMPARISON:  MRI lumbar spine 03/18/2018. Lumbar radiographs from the same day. FINDINGS: Segmentation:  Standard. Alignment:  Similar retrolisthesis of L2 on L3 and L3 on L4. Vertebrae: Vertebral body heights are maintained. Similar degenerative/discogenic endplate signal changes at multiple levels, most conspicuous about L2-L3, L3-L4, and L4-L5. No new focal marrow signal change to suggest acute fracture, discitis/osteomyelitis, or suspicious bone lesion. Conus medullaris and cauda equina: Conus extends to the L1 level. Conus and cauda equina appear normal.  Paraspinal and other soft tissues: No paravertebral mass. Mild edema in the posterior paraspinal soft tissues at L4, similar to prior. Right renal cysts. Disc levels: T12-L1: Minimal disc bulge without significant canal or foraminal stenosis. L1-L2: Posterior decompression. Similar disc space narrowing in desiccation. Minimal disc bulge. No significant canal or foraminal stenosis.  L2-L3: Posterior decompression. Similar disc space narrowing with endplate spurring. No significant canal stenosis. Moderate bilateral foraminal stenosis. Findings are similar to prior. L3-L4: Posterior decompression. Disc desiccation and height loss. Endplate spurring without significant canal stenosis. Bilateral facet hypertrophy moderate left and mild-to-moderate right foraminal stenosis, similar to prior. L4-L5: Posterior decompression. Disc desiccation and height loss. No significant central canal stenosis. Similar bilateral facet hypertrophy with narrowing of the right subarticular recess. Severe right and moderate left foraminal stenosis, similar to prior. Similar inferiorly dissecting right subarticular disc protrusion. L5-S1: Disc desiccation and height loss. Bilateral facet hypertrophy without significant canal stenosis. Similar severe bilateral foraminal stenosis. IMPRESSION: 1. No evidence of acute abnormality. No new focal marrow signal abnormality or vertebral body height loss to suggest acute fracture, although CT could provide better osseous evaluation if clinically indicated. 2. Similar severe multilevel degenerative change with severe foraminal stenosis on the right at L4-L5 and bilaterally at L5-S1. Moderate foraminal stenosis on the left at L3-L4 and L4-L5. Electronically Signed   By: Feliberto Harts MD   On: 06/21/2020 15:11    Procedures Procedures (including critical care time)  Medications Ordered in ED Medications  morphine 4 MG/ML injection 4 mg (4 mg Intravenous Given 06/21/20 1328)  morphine 4 MG/ML  injection 4 mg (4 mg Intravenous Given 06/21/20 1516)  HYDROmorphone (DILAUDID) injection 0.5 mg (0.5 mg Intravenous Given 06/21/20 1559)    ED Course  I have reviewed the triage vital signs and the nursing notes.  Pertinent labs & imaging results that were available during my care of the patient were reviewed by me and considered in my medical decision making (see chart for details).  Clinical Course as of Jun 21 1744  Tue Jun 21, 2020  1416 Plain film x-rays of his lumbar spine ordered and interpreted by me as multilevel degenerative changes.  Awaiting radiology reading.   [MB]  1512 Patient signed out to oncoming provider Dr. Stevie Kern to follow-up on MRI report and if no significant findings see if we can get his pain under control enough to follow-up with his physicians.   [MB]    Clinical Course User Index [MB] Terrilee Files, MD   MDM Rules/Calculators/A&P                         This patient complains of low back pain, bilateral leg weakness numbness; this involves an extensive number of treatment Options and is a complaint that carries with it a high risk of complications and Morbidity. The differential includes fracture, musculoskeletal, disc injury, radiculopathy, central cord  I ordered, reviewed and interpreted labs, which included CBC with normal white count normal hemoglobin, chemistries fairly normal I ordered medication IV pain medication with improvement in his symptoms I ordered imaging studies which included plain film lumbar spine and MRI lumbar spine and I independently    visualized and interpreted imaging which showed degenerative changes  Previous records obtained and reviewed in epic, no recent admissions  After the interventions stated above, I reevaluated the patient and found patient still to be in pain and weak on exam although I think this is mostly pain related.  He has signed out to oncoming provider to follow-up on results of MRI and reassess patient's  neurologic exam to see if he be appropriate for discharge.   Final Clinical Impression(s) / ED Diagnoses Final diagnoses:  Strain of lumbar region, initial encounter    Rx / DC Orders ED Discharge Orders  Ordered    oxyCODONE-acetaminophen (PERCOCET) 5-325 MG tablet  Every 6 hours PRN        06/21/20 1546           Terrilee Files, MD 06/21/20 1750

## 2020-08-26 ENCOUNTER — Emergency Department (HOSPITAL_COMMUNITY): Payer: Medicare Other

## 2020-08-26 ENCOUNTER — Inpatient Hospital Stay (HOSPITAL_COMMUNITY)
Admission: EM | Admit: 2020-08-26 | Discharge: 2020-08-31 | DRG: 291 | Disposition: A | Discharge status: Skilled Nursing Facility | Payer: Medicare Other | Attending: Internal Medicine | Admitting: Internal Medicine

## 2020-08-26 ENCOUNTER — Other Ambulatory Visit: Payer: Self-pay

## 2020-08-26 ENCOUNTER — Encounter (HOSPITAL_COMMUNITY): Payer: Self-pay | Admitting: Pharmacy Technician

## 2020-08-26 DIAGNOSIS — M5136 Other intervertebral disc degeneration, lumbar region: Secondary | ICD-10-CM | POA: Diagnosis present

## 2020-08-26 DIAGNOSIS — R159 Full incontinence of feces: Secondary | ICD-10-CM | POA: Diagnosis present

## 2020-08-26 DIAGNOSIS — Z888 Allergy status to other drugs, medicaments and biological substances status: Secondary | ICD-10-CM

## 2020-08-26 DIAGNOSIS — Z7982 Long term (current) use of aspirin: Secondary | ICD-10-CM

## 2020-08-26 DIAGNOSIS — F32A Depression, unspecified: Secondary | ICD-10-CM | POA: Diagnosis present

## 2020-08-26 DIAGNOSIS — G8929 Other chronic pain: Secondary | ICD-10-CM | POA: Diagnosis present

## 2020-08-26 DIAGNOSIS — Z83438 Family history of other disorder of lipoprotein metabolism and other lipidemia: Secondary | ICD-10-CM

## 2020-08-26 DIAGNOSIS — Z800 Family history of malignant neoplasm of digestive organs: Secondary | ICD-10-CM

## 2020-08-26 DIAGNOSIS — R0902 Hypoxemia: Secondary | ICD-10-CM

## 2020-08-26 DIAGNOSIS — K219 Gastro-esophageal reflux disease without esophagitis: Secondary | ICD-10-CM | POA: Diagnosis present

## 2020-08-26 DIAGNOSIS — D696 Thrombocytopenia, unspecified: Secondary | ICD-10-CM | POA: Diagnosis present

## 2020-08-26 DIAGNOSIS — D539 Nutritional anemia, unspecified: Secondary | ICD-10-CM | POA: Diagnosis present

## 2020-08-26 DIAGNOSIS — Z981 Arthrodesis status: Secondary | ICD-10-CM

## 2020-08-26 DIAGNOSIS — I5033 Acute on chronic diastolic (congestive) heart failure: Secondary | ICD-10-CM | POA: Diagnosis present

## 2020-08-26 DIAGNOSIS — Z8673 Personal history of transient ischemic attack (TIA), and cerebral infarction without residual deficits: Secondary | ICD-10-CM

## 2020-08-26 DIAGNOSIS — R0602 Shortness of breath: Secondary | ICD-10-CM

## 2020-08-26 DIAGNOSIS — F1010 Alcohol abuse, uncomplicated: Secondary | ICD-10-CM | POA: Diagnosis present

## 2020-08-26 DIAGNOSIS — N390 Urinary tract infection, site not specified: Secondary | ICD-10-CM | POA: Diagnosis present

## 2020-08-26 DIAGNOSIS — I509 Heart failure, unspecified: Secondary | ICD-10-CM

## 2020-08-26 DIAGNOSIS — I252 Old myocardial infarction: Secondary | ICD-10-CM

## 2020-08-26 DIAGNOSIS — G20A1 Parkinson's disease without dyskinesia, without mention of fluctuations: Secondary | ICD-10-CM | POA: Diagnosis present

## 2020-08-26 DIAGNOSIS — F419 Anxiety disorder, unspecified: Secondary | ICD-10-CM | POA: Diagnosis present

## 2020-08-26 DIAGNOSIS — M5126 Other intervertebral disc displacement, lumbar region: Secondary | ICD-10-CM | POA: Diagnosis present

## 2020-08-26 DIAGNOSIS — Z20822 Contact with and (suspected) exposure to covid-19: Secondary | ICD-10-CM | POA: Diagnosis present

## 2020-08-26 DIAGNOSIS — G20 Parkinson's disease: Secondary | ICD-10-CM | POA: Diagnosis present

## 2020-08-26 DIAGNOSIS — J9601 Acute respiratory failure with hypoxia: Secondary | ICD-10-CM | POA: Diagnosis present

## 2020-08-26 DIAGNOSIS — Z882 Allergy status to sulfonamides status: Secondary | ICD-10-CM

## 2020-08-26 DIAGNOSIS — G259 Extrapyramidal and movement disorder, unspecified: Secondary | ICD-10-CM | POA: Diagnosis present

## 2020-08-26 DIAGNOSIS — I10 Essential (primary) hypertension: Secondary | ICD-10-CM | POA: Diagnosis present

## 2020-08-26 DIAGNOSIS — E538 Deficiency of other specified B group vitamins: Secondary | ICD-10-CM | POA: Diagnosis present

## 2020-08-26 DIAGNOSIS — Z79899 Other long term (current) drug therapy: Secondary | ICD-10-CM

## 2020-08-26 DIAGNOSIS — I480 Paroxysmal atrial fibrillation: Secondary | ICD-10-CM | POA: Diagnosis present

## 2020-08-26 DIAGNOSIS — Z7902 Long term (current) use of antithrombotics/antiplatelets: Secondary | ICD-10-CM

## 2020-08-26 DIAGNOSIS — I110 Hypertensive heart disease with heart failure: Secondary | ICD-10-CM | POA: Diagnosis not present

## 2020-08-26 DIAGNOSIS — Z955 Presence of coronary angioplasty implant and graft: Secondary | ICD-10-CM

## 2020-08-26 DIAGNOSIS — E876 Hypokalemia: Secondary | ICD-10-CM | POA: Diagnosis present

## 2020-08-26 DIAGNOSIS — B952 Enterococcus as the cause of diseases classified elsewhere: Secondary | ICD-10-CM | POA: Diagnosis present

## 2020-08-26 DIAGNOSIS — I2510 Atherosclerotic heart disease of native coronary artery without angina pectoris: Secondary | ICD-10-CM | POA: Diagnosis present

## 2020-08-26 DIAGNOSIS — M48061 Spinal stenosis, lumbar region without neurogenic claudication: Secondary | ICD-10-CM | POA: Diagnosis present

## 2020-08-26 DIAGNOSIS — Z91013 Allergy to seafood: Secondary | ICD-10-CM

## 2020-08-26 LAB — URINALYSIS, ROUTINE W REFLEX MICROSCOPIC
Bilirubin Urine: NEGATIVE
Glucose, UA: NEGATIVE mg/dL
Ketones, ur: 20 mg/dL — AB
Nitrite: POSITIVE — AB
Protein, ur: NEGATIVE mg/dL
Specific Gravity, Urine: 1.009 (ref 1.005–1.030)
WBC, UA: >50 WBC/hpf — ABNORMAL HIGH (ref 0–5)
pH: 5 (ref 5.0–8.0)

## 2020-08-26 LAB — CBC
HCT: 46.9 % (ref 39.0–52.0)
Hemoglobin: 15.5 g/dL (ref 13.0–17.0)
MCH: 33.6 pg (ref 26.0–34.0)
MCHC: 33 g/dL (ref 30.0–36.0)
MCV: 101.7 fL — ABNORMAL HIGH (ref 80.0–100.0)
Platelets: 143 10*3/uL — ABNORMAL LOW (ref 150–400)
RBC: 4.61 MIL/uL (ref 4.22–5.81)
RDW: 13 % (ref 11.5–15.5)
WBC: 7.7 10*3/uL (ref 4.0–10.5)
nRBC: 0 % (ref 0.0–0.2)

## 2020-08-26 LAB — BASIC METABOLIC PANEL
Anion gap: 17 — ABNORMAL HIGH (ref 5–15)
BUN: 12 mg/dL (ref 8–23)
CO2: 18 mmol/L — ABNORMAL LOW (ref 22–32)
Calcium: 8.6 mg/dL — ABNORMAL LOW (ref 8.9–10.3)
Chloride: 101 mmol/L (ref 98–111)
Creatinine, Ser: 0.93 mg/dL (ref 0.61–1.24)
GFR, Estimated: >60 mL/min (ref >60)
Glucose, Bld: 102 mg/dL — ABNORMAL HIGH (ref 70–99)
Potassium: 4.9 mmol/L (ref 3.5–5.1)
Sodium: 136 mmol/L (ref 135–145)

## 2020-08-26 LAB — RESP PANEL BY RT-PCR (FLU A&B, COVID) ARPGX2
Influenza A by PCR: NEGATIVE
Influenza B by PCR: NEGATIVE
SARS Coronavirus 2 by RT PCR: NEGATIVE

## 2020-08-26 LAB — I-STAT VENOUS BLOOD GAS, ED
Acid-base deficit: 1 mmol/L (ref 0.0–2.0)
Bicarbonate: 20.6 mmol/L (ref 20.0–28.0)
Calcium, Ion: 0.99 mmol/L — ABNORMAL LOW (ref 1.15–1.40)
HCT: 41 % (ref 39.0–52.0)
Hemoglobin: 13.9 g/dL (ref 13.0–17.0)
O2 Saturation: 99 %
Potassium: 4.3 mmol/L (ref 3.5–5.1)
Sodium: 136 mmol/L (ref 135–145)
TCO2: 21 mmol/L — ABNORMAL LOW (ref 22–32)
pCO2, Ven: 27.4 mmHg — ABNORMAL LOW (ref 44.0–60.0)
pH, Ven: 7.484 — ABNORMAL HIGH (ref 7.250–7.430)
pO2, Ven: 107 mmHg — ABNORMAL HIGH (ref 32.0–45.0)

## 2020-08-26 LAB — BRAIN NATRIURETIC PEPTIDE: B Natriuretic Peptide: 124.4 pg/mL — ABNORMAL HIGH (ref 0.0–100.0)

## 2020-08-26 LAB — TROPONIN I (HIGH SENSITIVITY)
Troponin I (High Sensitivity): 9 ng/L (ref <18)
Troponin I (High Sensitivity): 11 ng/L (ref <18)

## 2020-08-26 MED ORDER — IOHEXOL 350 MG/ML SOLN
100.0000 mL | Freq: Once | INTRAVENOUS | Status: AC | PRN
  Administered 2020-08-26: 100 mL via INTRAVENOUS

## 2020-08-26 MED ORDER — SODIUM CHLORIDE 0.9% FLUSH
10.0000 mL | Freq: Two times a day (BID) | INTRAVENOUS | Status: DC
Start: 2020-08-26 — End: 2020-08-27

## 2020-08-26 MED ORDER — SODIUM CHLORIDE 0.9% FLUSH
10.0000 mL | INTRAVENOUS | Status: DC | PRN
Start: 2020-08-26 — End: 2020-08-27

## 2020-08-26 MED ORDER — FUROSEMIDE 10 MG/ML IJ SOLN: 40.0000 mg | Freq: Once | INTRAMUSCULAR | Status: DC

## 2020-08-26 MED ORDER — SODIUM CHLORIDE 0.9 % IV SOLN
1.0000 g | Freq: Once | INTRAVENOUS | Status: AC
  Administered 2020-08-26: 1 g via INTRAVENOUS
  Filled 2020-08-26: qty 10

## 2020-08-26 MED ORDER — FUROSEMIDE 10 MG/ML IJ SOLN
60.0000 mg | Freq: Once | INTRAMUSCULAR | Status: AC
  Administered 2020-08-26: 60 mg via INTRAVENOUS
  Filled 2020-08-26: qty 6
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
## 2020-08-26 NOTE — ED Notes (Addendum)
Unsuccessful PIV start x3. Pt tolerated well. IV team consult ordered.

## 2020-08-26 NOTE — ED Notes (Signed)
Pt not in room, unable to obtain vitals.  °
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
## 2020-08-26 NOTE — ED Notes (Signed)
Patient soiled himself in MRI. Patient cleaned of incontinence, brief and gown changed. 68mm condom cath applied. Patient placed on cardiac monitor.

## 2020-08-26 NOTE — ED Notes (Signed)
IV team at bedside for PIV placement for CT scan.

## 2020-08-26 NOTE — ED Notes (Signed)
Pt had output, bladder scan showed in bladder, pt then urinated approximately .

## 2020-08-26 NOTE — ED Notes (Signed)
Pt to MRI via stretcher by transport. 

## 2020-08-27 ENCOUNTER — Encounter (HOSPITAL_COMMUNITY): Payer: Self-pay | Admitting: Internal Medicine

## 2020-08-27 DIAGNOSIS — M5126 Other intervertebral disc displacement, lumbar region: Secondary | ICD-10-CM | POA: Diagnosis present

## 2020-08-27 DIAGNOSIS — M5459 Other low back pain: Secondary | ICD-10-CM | POA: Diagnosis not present

## 2020-08-27 DIAGNOSIS — I5033 Acute on chronic diastolic (congestive) heart failure: Secondary | ICD-10-CM | POA: Diagnosis present

## 2020-08-27 DIAGNOSIS — G8929 Other chronic pain: Secondary | ICD-10-CM | POA: Diagnosis present

## 2020-08-27 DIAGNOSIS — R0602 Shortness of breath: Secondary | ICD-10-CM | POA: Diagnosis present

## 2020-08-27 DIAGNOSIS — Z20822 Contact with and (suspected) exposure to covid-19: Secondary | ICD-10-CM | POA: Diagnosis present

## 2020-08-27 DIAGNOSIS — E538 Deficiency of other specified B group vitamins: Secondary | ICD-10-CM | POA: Diagnosis present

## 2020-08-27 DIAGNOSIS — R0600 Dyspnea, unspecified: Secondary | ICD-10-CM | POA: Diagnosis not present

## 2020-08-27 DIAGNOSIS — R0609 Other forms of dyspnea: Secondary | ICD-10-CM | POA: Diagnosis not present

## 2020-08-27 DIAGNOSIS — F32A Depression, unspecified: Secondary | ICD-10-CM | POA: Diagnosis present

## 2020-08-27 DIAGNOSIS — G259 Extrapyramidal and movement disorder, unspecified: Secondary | ICD-10-CM | POA: Diagnosis present

## 2020-08-27 DIAGNOSIS — M48061 Spinal stenosis, lumbar region without neurogenic claudication: Secondary | ICD-10-CM | POA: Diagnosis present

## 2020-08-27 DIAGNOSIS — G20 Parkinson's disease: Secondary | ICD-10-CM | POA: Diagnosis present

## 2020-08-27 DIAGNOSIS — F1010 Alcohol abuse, uncomplicated: Secondary | ICD-10-CM | POA: Diagnosis present

## 2020-08-27 DIAGNOSIS — I10 Essential (primary) hypertension: Secondary | ICD-10-CM | POA: Diagnosis not present

## 2020-08-27 DIAGNOSIS — I2510 Atherosclerotic heart disease of native coronary artery without angina pectoris: Secondary | ICD-10-CM | POA: Diagnosis present

## 2020-08-27 DIAGNOSIS — F419 Anxiety disorder, unspecified: Secondary | ICD-10-CM | POA: Diagnosis present

## 2020-08-27 DIAGNOSIS — M5136 Other intervertebral disc degeneration, lumbar region: Secondary | ICD-10-CM | POA: Diagnosis present

## 2020-08-27 DIAGNOSIS — I110 Hypertensive heart disease with heart failure: Secondary | ICD-10-CM | POA: Diagnosis present

## 2020-08-27 DIAGNOSIS — I252 Old myocardial infarction: Secondary | ICD-10-CM | POA: Diagnosis not present

## 2020-08-27 DIAGNOSIS — E876 Hypokalemia: Secondary | ICD-10-CM | POA: Diagnosis present

## 2020-08-27 DIAGNOSIS — J9601 Acute respiratory failure with hypoxia: Secondary | ICD-10-CM | POA: Diagnosis present

## 2020-08-27 DIAGNOSIS — B952 Enterococcus as the cause of diseases classified elsewhere: Secondary | ICD-10-CM | POA: Diagnosis present

## 2020-08-27 DIAGNOSIS — I509 Heart failure, unspecified: Secondary | ICD-10-CM

## 2020-08-27 DIAGNOSIS — I350 Nonrheumatic aortic (valve) stenosis: Secondary | ICD-10-CM | POA: Diagnosis not present

## 2020-08-27 DIAGNOSIS — I5032 Chronic diastolic (congestive) heart failure: Secondary | ICD-10-CM | POA: Diagnosis not present

## 2020-08-27 DIAGNOSIS — I480 Paroxysmal atrial fibrillation: Secondary | ICD-10-CM | POA: Diagnosis present

## 2020-08-27 DIAGNOSIS — D696 Thrombocytopenia, unspecified: Secondary | ICD-10-CM | POA: Diagnosis present

## 2020-08-27 DIAGNOSIS — Z955 Presence of coronary angioplasty implant and graft: Secondary | ICD-10-CM | POA: Diagnosis not present

## 2020-08-27 DIAGNOSIS — D539 Nutritional anemia, unspecified: Secondary | ICD-10-CM | POA: Diagnosis present

## 2020-08-27 DIAGNOSIS — K219 Gastro-esophageal reflux disease without esophagitis: Secondary | ICD-10-CM | POA: Diagnosis present

## 2020-08-27 DIAGNOSIS — N390 Urinary tract infection, site not specified: Secondary | ICD-10-CM | POA: Diagnosis present

## 2020-08-27 LAB — LACTIC ACID, PLASMA: Lactic Acid, Venous: 1.2 mmol/L (ref 0.5–1.9)

## 2020-08-27 LAB — CBC WITH DIFFERENTIAL/PLATELET
Abs Immature Granulocytes: 0.04 10*3/uL (ref 0.00–0.07)
Basophils Absolute: 0 10*3/uL (ref 0.0–0.1)
Basophils Relative: 0 %
Eosinophils Absolute: 0 10*3/uL (ref 0.0–0.5)
Eosinophils Relative: 0 %
HCT: 41.7 % (ref 39.0–52.0)
Hemoglobin: 14.6 g/dL (ref 13.0–17.0)
Immature Granulocytes: 1 %
Lymphocytes Relative: 12 %
Lymphs Abs: 0.8 10*3/uL (ref 0.7–4.0)
MCH: 36 pg — ABNORMAL HIGH (ref 26.0–34.0)
MCHC: 35 g/dL (ref 30.0–36.0)
MCV: 102.7 fL — ABNORMAL HIGH (ref 80.0–100.0)
Monocytes Absolute: 0.6 10*3/uL (ref 0.1–1.0)
Monocytes Relative: 9 %
Neutro Abs: 5.1 10*3/uL (ref 1.7–7.7)
Neutrophils Relative %: 78 %
Platelets: 113 10*3/uL — ABNORMAL LOW (ref 150–400)
RBC: 4.06 MIL/uL — ABNORMAL LOW (ref 4.22–5.81)
RDW: 13.2 % (ref 11.5–15.5)
WBC: 6.6 10*3/uL (ref 4.0–10.5)
nRBC: 0 % (ref 0.0–0.2)

## 2020-08-27 LAB — COMPREHENSIVE METABOLIC PANEL
ALT: 18 U/L (ref 0–44)
AST: 21 U/L (ref 15–41)
Albumin: 3.2 g/dL — ABNORMAL LOW (ref 3.5–5.0)
Alkaline Phosphatase: 84 U/L (ref 38–126)
Anion gap: 16 — ABNORMAL HIGH (ref 5–15)
BUN: 14 mg/dL (ref 8–23)
CO2: 25 mmol/L (ref 22–32)
Calcium: 8.3 mg/dL — ABNORMAL LOW (ref 8.9–10.3)
Chloride: 99 mmol/L (ref 98–111)
Creatinine, Ser: 1.07 mg/dL (ref 0.61–1.24)
GFR, Estimated: >60 mL/min (ref >60)
Glucose, Bld: 95 mg/dL (ref 70–99)
Potassium: 3.6 mmol/L (ref 3.5–5.1)
Sodium: 140 mmol/L (ref 135–145)
Total Bilirubin: 2.5 mg/dL — ABNORMAL HIGH (ref 0.3–1.2)
Total Protein: 5.8 g/dL — ABNORMAL LOW (ref 6.5–8.1)

## 2020-08-27 LAB — MAGNESIUM: Magnesium: 2.1 mg/dL (ref 1.7–2.4)

## 2020-08-27 LAB — TSH: TSH: 1.089 u[IU]/mL (ref 0.350–4.500)

## 2020-08-27 MED ORDER — SODIUM CHLORIDE 0.9 % IV SOLN: 1.0000 g | INTRAVENOUS | Status: DC

## 2020-08-27 MED ORDER — CARBIDOPA-LEVODOPA 25-100 MG PO TABS
2.0000 | ORAL_TABLET | Freq: Three times a day (TID) | ORAL | Status: DC
  Administered 2020-08-27 – 2020-08-31 (×13): 2 via ORAL
  Filled 2020-08-27 (×16): qty 2

## 2020-08-27 MED ORDER — FAMOTIDINE 20 MG PO TABS
20.0000 mg | ORAL_TABLET | Freq: Every day | ORAL | Status: DC
  Administered 2020-08-27 – 2020-08-31 (×5): 20 mg via ORAL
  Filled 2020-08-27 (×5): qty 1

## 2020-08-27 MED ORDER — DABIGATRAN ETEXILATE MESYLATE 75 MG PO CAPS
75.0000 mg | ORAL_CAPSULE | Freq: Every day | ORAL | Status: DC
Start: 2020-08-27 — End: 2020-08-29
  Administered 2020-08-27 – 2020-08-29 (×3): 75 mg via ORAL
  Filled 2020-08-27 (×3): qty 1

## 2020-08-27 MED ORDER — RAMELTEON 8 MG PO TABS
8.0000 mg | ORAL_TABLET | Freq: Every day | ORAL | Status: DC
  Administered 2020-08-27 – 2020-08-30 (×4): 8 mg via ORAL
  Filled 2020-08-27 (×5): qty 1

## 2020-08-27 MED ORDER — ACETAMINOPHEN 650 MG RE SUPP: 650.0000 mg | Freq: Four times a day (QID) | RECTAL | Status: DC | PRN

## 2020-08-27 MED ORDER — BUSPIRONE HCL 10 MG PO TABS
10.0000 mg | ORAL_TABLET | Freq: Three times a day (TID) | ORAL | Status: DC
  Administered 2020-08-27 – 2020-08-31 (×13): 10 mg via ORAL
  Filled 2020-08-27 (×6): qty 1
  Filled 2020-08-27: qty 2
  Filled 2020-08-27 (×6): qty 1

## 2020-08-27 MED ORDER — DULOXETINE HCL 60 MG PO CPEP
90.0000 mg | ORAL_CAPSULE | Freq: Every day | ORAL | Status: DC
  Administered 2020-08-27 – 2020-08-31 (×5): 90 mg via ORAL
  Filled 2020-08-27 (×5): qty 1

## 2020-08-27 MED ORDER — FUROSEMIDE 10 MG/ML IJ SOLN
80.0000 mg | Freq: Two times a day (BID) | INTRAMUSCULAR | Status: DC
  Administered 2020-08-27 (×2): 80 mg via INTRAVENOUS
  Filled 2020-08-27 (×2): qty 8

## 2020-08-27 MED ORDER — FLUOXETINE HCL 20 MG PO CAPS
20.0000 mg | ORAL_CAPSULE | Freq: Every day | ORAL | Status: DC
  Administered 2020-08-27 – 2020-08-29 (×3): 20 mg via ORAL
  Filled 2020-08-27 (×3): qty 1

## 2020-08-27 MED ORDER — ACETAMINOPHEN 325 MG PO TABS: 650.0000 mg | ORAL_TABLET | Freq: Four times a day (QID) | ORAL | Status: DC | PRN

## 2020-08-27 MED ORDER — ATORVASTATIN CALCIUM 80 MG PO TABS
80.0000 mg | ORAL_TABLET | Freq: Every evening | ORAL | Status: DC
  Administered 2020-08-27 – 2020-08-29 (×3): 80 mg via ORAL
  Filled 2020-08-27 (×3): qty 1

## 2020-08-27 MED ORDER — THIAMINE HCL 100 MG PO TABS
100.0000 mg | ORAL_TABLET | Freq: Every day | ORAL | Status: DC
  Administered 2020-08-27 – 2020-08-31 (×5): 100 mg via ORAL
  Filled 2020-08-27 (×5): qty 1

## 2020-08-27 MED ORDER — DOCUSATE SODIUM 100 MG PO CAPS: 100.0000 mg | ORAL_CAPSULE | ORAL | Status: DC | PRN

## 2020-08-27 MED ORDER — ASPIRIN 81 MG PO CHEW
81.0000 mg | CHEWABLE_TABLET | Freq: Every day | ORAL | Status: DC
  Administered 2020-08-27 – 2020-08-31 (×5): 81 mg via ORAL
  Filled 2020-08-27 (×5): qty 1

## 2020-08-27 MED ORDER — POTASSIUM CHLORIDE CRYS ER 20 MEQ PO TBCR
20.0000 meq | EXTENDED_RELEASE_TABLET | Freq: Every day | ORAL | Status: DC
  Administered 2020-08-27 – 2020-08-29 (×3): 20 meq via ORAL
  Filled 2020-08-27 (×3): qty 1

## 2020-08-27 MED ORDER — ONDANSETRON 4 MG PO TBDP
4.0000 mg | ORAL_TABLET | Freq: Three times a day (TID) | ORAL | Status: DC | PRN
  Administered 2020-08-27: 4 mg via ORAL
  Filled 2020-08-27 (×2): qty 1

## 2020-08-27 MED ORDER — VITAMIN D 25 MCG (1000 UNIT) PO TABS
1000.0000 [IU] | ORAL_TABLET | Freq: Every day | ORAL | Status: DC
  Administered 2020-08-27 – 2020-08-31 (×5): 1000 [IU] via ORAL
  Filled 2020-08-27 (×5): qty 1

## 2020-08-27 MED ORDER — ALBUTEROL SULFATE HFA 108 (90 BASE) MCG/ACT IN AERS: 2.0000 | INHALATION_SPRAY | Freq: Four times a day (QID) | RESPIRATORY_TRACT | Status: DC | PRN

## 2020-08-27 MED ORDER — CITALOPRAM HYDROBROMIDE 10 MG PO TABS
10.0000 mg | ORAL_TABLET | Freq: Every day | ORAL | Status: DC
  Administered 2020-08-27 – 2020-08-28 (×2): 10 mg via ORAL
  Filled 2020-08-27 (×2): qty 1

## 2020-08-27 MED ORDER — TAMSULOSIN HCL 0.4 MG PO CAPS
0.4000 mg | ORAL_CAPSULE | Freq: Every day | ORAL | Status: DC
  Administered 2020-08-27 – 2020-08-30 (×4): 0.4 mg via ORAL
  Filled 2020-08-27 (×4): qty 1

## 2020-08-27 MED ORDER — CARVEDILOL 6.25 MG PO TABS
6.2500 mg | ORAL_TABLET | Freq: Two times a day (BID) | ORAL | Status: DC
  Administered 2020-08-27 – 2020-08-30 (×7): 6.25 mg via ORAL
  Filled 2020-08-27 (×8): qty 1

## 2020-08-27 MED ORDER — PANTOPRAZOLE SODIUM 40 MG PO TBEC
40.0000 mg | DELAYED_RELEASE_TABLET | Freq: Every day | ORAL | Status: DC
  Administered 2020-08-27 – 2020-08-31 (×5): 40 mg via ORAL
  Filled 2020-08-27 (×5): qty 1

## 2020-08-27 MED ORDER — CLOPIDOGREL BISULFATE 75 MG PO TABS
75.0000 mg | ORAL_TABLET | Freq: Every day | ORAL | Status: DC
  Administered 2020-08-27 – 2020-08-31 (×5): 75 mg via ORAL
  Filled 2020-08-27 (×5): qty 1

## 2020-08-27 NOTE — H&P (Addendum)
History and Physical    Raymond KatosRichard E Raj ZOX:[id number] DOB: 07/01/1951 DOA: 08/26/2020  PCP: Judeth Hornamsey, Robert M, PA-C  Patient coming from: Home.  Chief Complaint: Shortness of breath.  HPI: Raymond White is a 70 y.o. male with history of CAD status post stenting, diastolic dysfunction per 2D echo done in August 2021, Parkinson's disease, B12 deficiency, stroke and A. fib has been experiencing worsening shortness of breath for the last 3 days.  Patient states his primary care physician had increased his Lasix from 20 twice daily to 80 mg twice daily over the last 1 month.  Despite which patient has been getting progressively exertional shortness of breath with increasing peripheral edema and last 3 days he was hardly able to exert because of the shortness of breath.  He has gained 20 pounds weight.  Denies chest pain productive cough fever chills.  ED Course: In the ER patient was hypoxic requiring 3 L oxygen.  CT angiogram of the chest was negative for pulmonary embolism but showed right hemidiaphragm mildly elevated.  EKG shows sinus tachycardia.  Labs were unremarkable including Covid test was negative.  Patient was given Lasix 60 mg IV admitted for further management of acute hypoxic respiratory failure likely from CHF.  Review of Systems: As per HPI, rest all negative.   Past Medical History:  Diagnosis Date  . Acute cystitis with hematuria   . Acute midline low back pain with sciatica   . CHF (congestive heart failure) (HCC)   . Chronic heart failure with preserved ejection fraction (HCC)   . DDD (degenerative disc disease), lumbar   . Depressive disorder   . Drug induced constipation   . GERD (gastroesophageal reflux disease)   . History of TIA (transient ischemic attack)   . Hypertension   . Pancreatitis   . Parkinson's disease (HCC) 2015  . Stroke (HCC)   . Uncomplicated alcohol dependence (HCC)   . Urinary incontinence     Past Surgical History:  Procedure  Laterality Date  . APPENDECTOMY    . BACK SURGERY     x5  . CHOLECYSTECTOMY    . COLONOSCOPY    . ESOPHAGOGASTRODUODENOSCOPY    . KNEE ARTHROSCOPY    . neck fusion       reports that he has never smoked. He has never used smokeless tobacco. He reports previous alcohol use. He reports that he does not use drugs.  Allergies  Allergen Reactions  . Fish-Derived Products Anaphylaxis  . Zoloft [Sertraline Hcl] Rash  . Sulfamethizole Rash    Family History  Problem Relation Age of Onset  . Hyperlipidemia Mother   . Colon cancer Father     Prior to Admission medications   Medication Sig Start Date End Date Taking? Authorizing Provider  albuterol (PROVENTIL HFA;VENTOLIN HFA) 108 (90 Base) MCG/ACT inhaler Inhale 2 puffs into the lungs every 6 (six) hours as needed for wheezing or shortness of breath. Wait 1 minute in between each puff   Yes [provider]  aspirin 81 MG chewable tablet Chew 81 mg by mouth daily.   Yes [provider]  atorvastatin (LIPITOR) 80 MG tablet Take 80 mg by mouth every evening.   Yes [provider]  busPIRone (BUSPAR) 10 MG tablet Take 10 mg by mouth 3 (three) times daily.   Yes [provider]  carbidopa-levodopa (SINEMET IR) 25-100 MG tablet Take 2 tablets by mouth 3 (three) times daily.    Yes [provider]  carvedilol (COREG)  3.125 MG tablet Take 6.25 mg by mouth 2 (two) times daily with a meal.   Yes [provider]  Cholecalciferol 25 MCG (1000 UT) capsule Take 1,000 Units by mouth daily.    Yes [provider]  citalopram (CELEXA) 10 MG tablet Take 10 mg by mouth at bedtime.   Yes [provider]  clopidogrel (PLAVIX) 75 MG tablet Take 75 mg by mouth daily.   Yes [provider]  CYANOCOBALAMIN IJ Inject as directed every 30 (thirty) days. At [facility]   Yes [provider]  dabigatran (PRADAXA) 75 MG CAPS capsule Take 75 mg by mouth daily.   Yes [provider]  docusate sodium (COLACE) 100 MG capsule Take 1 capsule (100 mg total) by mouth as needed for mild constipation. 04/01/20  Yes Littie Deeds, MD  DULoxetine (CYMBALTA) 30 MG capsule Take 90 mg by mouth daily.    Yes [provider]  famotidine (PEPCID) 20 MG tablet Take 1 tablet by mouth in the morning and at bedtime. 06/04/19  Yes [provider]  Fluoxetine HCl, PMDD, 20 MG TABS Take 20 mg by mouth daily.   Yes [provider]  furosemide (LASIX) 20 MG tablet Take 3 tablets (60 mg total) by mouth daily. Patient taking differently: Take 80 mg by mouth 2 (two) times daily. 04/01/20  Yes Littie Deeds, MD  omeprazole (PRILOSEC) 20 MG capsule Take 20 mg by mouth 2 (two) times daily before a meal.    Yes [provider]  potassium chloride SA (K-DUR,KLOR-CON) 20 MEQ tablet Take 20 mEq by mouth daily. With food   Yes [provider]  tamsulosin (FLOMAX) 0.4 MG CAPS capsule Take 0.4 mg by mouth at bedtime.   Yes [provider]  thiamine (VITAMIN B-1) 100 MG tablet Take 100 mg by mouth daily.   Yes [provider]  EPINEPHrine 0.3 mg/0.3 mL IJ SOAJ injection Inject 0.3 mg into the muscle daily as needed for anaphylaxis.    [provider]    Physical Exam: Constitutional: Moderately built and nourished. Vitals:   08/27/20 0145 08/27/20 0200 08/27/20 0215 08/27/20 0230  BP: 115/75 126/82 128/79 118/76  Pulse: (!) 102 99 (!) 103 (!) 103  Resp: (!) 22 (!) 21 18 (!) 22  Temp:   (!) 97.5 F (36.4 C)   TempSrc:   Oral   SpO2: 94% 93% 94% 93%  Weight:      Height:       Eyes: Anicteric no pallor. ENMT: No discharge from the ears eyes nose or mouth. Neck: JVD not appreciated no mass felt. Respiratory: No rhonchi or crepitations. Cardiovascular: S1-S2 heard. Abdomen: Soft nontender bowel sounds present. Musculoskeletal: Bilateral lower extremity edema present. Skin: No rash. Neurologic: Alert awake oriented to time place and  person.  Moves all extremities. Psychiatric: Appears normal.  Normal affect.   Labs on Admission: I have personally reviewed following labs and imaging studies  CBC: Recent Labs  Lab 08/26/20 0933 08/26/20 1900  WBC 7.7  --   HGB 15.5 13.9  HCT 46.9 41.0  MCV 101.7*  --   PLT 143*  --    Basic Metabolic Panel: Recent Labs  Lab 08/26/20 0933 08/26/20 1900  NA 136 136  K 4.9 4.3  CL 101  --   CO2 18*  --   GLUCOSE 102*  --   BUN 12  --   CREATININE 0.93  --   CALCIUM 8.6*  --  GFR: Estimated Creatinine Clearance: 98 mL/min (by C-G formula based on SCr of 0.93 mg/dL). Liver Function Tests: No results for input(s): AST, ALT, ALKPHOS, BILITOT, PROT, ALBUMIN in the last 168 hours. No results for input(s): LIPASE, AMYLASE in the last 168 hours. No results for input(s): AMMONIA in the last 168 hours. Coagulation Profile: No results for input(s): INR, PROTIME in the last 168 hours. Cardiac Enzymes: No results for input(s): CKTOTAL, CKMB, CKMBINDEX, TROPONINI in the last 168 hours. BNP (last 3 results) No results for input(s): PROBNP in the last 8760 hours. HbA1C: No results for input(s): HGBA1C in the last 72 hours. CBG: No results for input(s): GLUCAP in the last 168 hours. Lipid Profile: No results for input(s): CHOL, HDL, LDLCALC, TRIG, CHOLHDL, LDLDIRECT in the last 72 hours. Thyroid Function Tests: No results for input(s): TSH, T4TOTAL, FREET4, T3FREE, THYROIDAB in the last 72 hours. Anemia Panel: No results for input(s): VITAMINB12, FOLATE, FERRITIN, TIBC, IRON, RETICCTPCT in the last 72 hours. Urine analysis:    Component Value Date/Time   COLORURINE YELLOW 08/26/2020 2035   APPEARANCEUR HAZY (A) 08/26/2020 2035   LABSPEC 1.009 08/26/2020 2035   PHURINE 5.0 08/26/2020 2035   GLUCOSEU NEGATIVE 08/26/2020 2035   HGBUR MODERATE (A) 08/26/2020 2035   BILIRUBINUR NEGATIVE 08/26/2020 2035   KETONESUR 20 (A) 08/26/2020 2035   PROTEINUR NEGATIVE 08/26/2020 2035    NITRITE POSITIVE (A) 08/26/2020 2035   LEUKOCYTESUR LARGE (A) 08/26/2020 2035   Sepsis Labs: @LABRCNTIP (procalcitonin:4,lacticidven:4) ) Recent Results (from the past 240 hour(s))  Resp Panel by RT-PCR (Flu A&B, Covid) Nasopharyngeal Swab     Status: None   Collection Time: 08/26/20  5:25 PM   Specimen: Nasopharyngeal Swab; Nasopharyngeal(NP) swabs in vial transport medium  Result Value Ref Range Status   SARS Coronavirus 2 by RT PCR NEGATIVE NEGATIVE Final    Comment: (NOTE) SARS-CoV-2 target nucleic acids are NOT DETECTED.  The SARS-CoV-2 RNA is generally detectable in upper respiratory specimens during the acute phase of infection. The lowest concentration of SARS-CoV-2 viral copies this assay can detect is 138 copies/mL. A negative result does not preclude SARS-Cov-2 infection and should not be used as the sole basis for treatment or other patient management decisions. A negative result may occur with  improper specimen collection/handling, submission of specimen other than nasopharyngeal swab, presence of viral mutation(s) within the areas targeted by this assay, and inadequate number of viral copies(<138 copies/mL). A negative result must be combined with clinical observations, patient history, and epidemiological information. The expected result is Negative.  Fact Sheet for Patients:  BloggerCourse.com  Fact Sheet for Healthcare Providers:  SeriousBroker.it  This test is no t yet approved or cleared by the [place] FDA and  has been authorized for detection and/or diagnosis of SARS-CoV-2 by FDA under an Emergency Use Authorization (EUA). This EUA will remain  in effect (meaning this test can be used) for the duration of the COVID-19 declaration under Section 564(b)(1) of the Act, 21 U.S.C.section 360bbb-3(b)(1), unless the authorization is terminated  or revoked sooner.       Influenza A by PCR NEGATIVE  NEGATIVE Final   Influenza B by PCR NEGATIVE NEGATIVE Final    Comment: (NOTE) The Xpert Xpress SARS-CoV-2/FLU/RSV plus assay is intended as an aid in the diagnosis of influenza from Nasopharyngeal swab specimens and should not be used as a sole basis for treatment. Nasal washings and aspirates are unacceptable for Xpert Xpress SARS-CoV-2/FLU/RSV testing.  Fact Sheet for Patients: BloggerCourse.com  Fact  Sheet for Healthcare Providers: SeriousBroker.it  This test is not yet approved or cleared by the [place] and has been authorized for detection and/or diagnosis of SARS-CoV-2 by FDA under an Emergency Use Authorization (EUA). This EUA will remain in effect (meaning this test can be used) for the duration of the COVID-19 declaration under Section 564(b)(1) of the Act, 21 U.S.C. section 360bbb-3(b)(1), unless the authorization is terminated or revoked.  Performed at [facility], [address], [place], [place] [address]      Radiological Exams on Admission: DG Chest 2 View  Result Date: 08/26/2020 CLINICAL DATA:  Shortness of breath, worse for 2 days EXAM: CHEST - 2 VIEW COMPARISON:  March 28, 2020 FINDINGS: Trachea midline. Cardiomediastinal contours and hilar structures are normal. No lobar consolidative changes. Basilar atelectasis and signs of pleural and parenchymal scarring noted in the RIGHT chest as before. LEFT chest is clear. Signs of prior rib resection involving RIGHT posterolateral rib 7. On limited assessment no acute skeletal process. IMPRESSION: Basilar atelectasis and signs of pleural and parenchymal scarring in the RIGHT chest related to postoperative changes as before. Electronically Signed   By: Donzetta Kohut M.D.   On: 08/26/2020 09:54   CT Angio Chest PE W/Cm &/Or Wo Cm  Result Date: 08/27/2020 CLINICAL DATA:  Chest pain or SOB, pleurisy or effusion suspected Shortness of breath, worse for the  past 2 days. EXAM: CT ANGIOGRAPHY CHEST WITH CONTRAST TECHNIQUE: Multidetector CT imaging of the chest was performed using the standard protocol during bolus administration of intravenous contrast. Multiplanar CT image reconstructions and MIPs were obtained to evaluate the vascular anatomy. CONTRAST:  OMNIPAQUE IOHEXOL 350 MG/ML SOLN COMPARISON:  Radiograph earlier today.  Chest CTA 06/24/2019 FINDINGS: Cardiovascular: Motion artifact particularly through the lung bases. Allowing for motion artifact, no evidence of acute pulmonary embolus. Aortic atherosclerosis and tortuosity. No dissection or acute aortic findings. Common origin of the brachiocephalic and left common carotid artery, variant arch anatomy. Heart is normal in size. There are coronary artery calcifications. No pericardial effusion. Mediastinum/Nodes: Small mediastinal and hilar lymph nodes not enlarged by size criteria. Small hiatal hernia. Upper esophagus is patulous. No suspicious thyroid nodule. Lungs/Pleura: Chronic elevation of the right hemidiaphragm with postsurgical or remote posttraumatic change of right ribs and chronic basilar consolidation, hyperdensity abutting the hemidiaphragm may be secondary to prior talc pleurodesis. Findings are stable from prior exam. Chronic atelectasis in the dependent right lower lobe and right middle lobe. No evidence of pneumonia or acute airspace disease. No pleural fluid. No findings of pulmonary edema. Trachea and central bronchi are patent. Upper Abdomen: Cholecystectomy.  No acute upper abdominal findings. Musculoskeletal: Postsurgical versus remote posttraumatic change of lower right ribs. Partial ankylosis at the thoracolumbar junction, chronic. Diffuse degenerative change in the thoracic spine. Review of the MIP images confirms the above findings. IMPRESSION: 1. No acute pulmonary embolus or acute intrathoracic abnormality. 2. Chronic elevation of the right hemidiaphragm with postsurgical or  remote posttraumatic change of right ribs and chronic basilar consolidation. Chronic hyperdensity abutting the hemidiaphragm may be secondary to prior talc pleurodesis. Chronic atelectasis in the dependent right lower lobe and right middle lobe. 3. Coronary artery calcifications. Aortic Atherosclerosis (ICD10-I70.0). Electronically Signed   By: Narda Rutherford M.D.   On: 08/27/2020 00:02   MR LUMBAR SPINE WO CONTRAST  Result Date: 08/26/2020 CLINICAL DATA:  Initial evaluation for acute low back pain, cauda equina syndrome suspected. EXAM: MRI LUMBAR SPINE WITHOUT CONTRAST TECHNIQUE: Multiplanar, multisequence  MR imaging of the lumbar spine was performed. No intravenous contrast was administered. COMPARISON:  Previous MRI from 06/21/2020 FINDINGS: Segmentation:  Examination degraded by motion artifact. Standard segmentation. Lowest well-formed disc space labeled the L5-S1 level. Alignment: Mild retrolisthesis of L2 on L3 and L3 on L4, stable. Underlying dextroscoliosis. Appearance is stable from previous. Vertebrae: Vertebral body height maintained without acute or interval fracture. T11 and T12 vertebral bodies are partially ankylosed posteriorly. Bone marrow signal intensity diffusely heterogeneous but within normal limits. No worrisome osseous lesions. Discogenic reactive endplate change present throughout the lumbar spine, most pronounced at L4-5 on the right. Conus medullaris and cauda equina: Conus extends to the L1 level. Conus and cauda equina appear normal. Paraspinal and other soft tissues: Chronic postoperative changes present within the posterior paraspinous soft tissues. Approximate 1 cm simple exophytic cyst noted extending from the interpolar right kidney. Additional 4.6 cm simple cyst present at the lower pole of the right kidney. Extrarenal pelvises noted bilaterally. Visualized visceral structures otherwise unremarkable. Disc levels: T12-L1: Mild disc bulge with disc desiccation. Superimposed  small right subarticular disc protrusion indents the right ventral thecal sac (series 8, image 5). Mild facet hypertrophy. No significant spinal stenosis. Foramina remain patent. L1-2: Degenerative intervertebral disc space narrowing with mild disc bulge. Discogenic reactive endplate change. Mild bilateral facet hypertrophy. No significant spinal stenosis. Foramina remain patent. L2-3: Retrolisthesis. Advanced degenerative intervertebral disc space narrowing with diffuse disc bulge, asymmetric to the left. Associated prominent reactive endplate change with marginal endplate osteophytic spurring. Prior posterior decompression. Residual mild facet hypertrophy. No significant spinal stenosis. Mild to moderate bilateral foraminal narrowing. Appearance is stable. L3-4: Retrolisthesis. Advanced degenerative intervertebral disc space narrowing with diffuse disc bulge. Associated discogenic reactive endplate change with marginal endplate osteophytic spurring, greater on the left. Prior posterior decompression. Residual right greater than left facet hypertrophy. Stable thecal sac patency without significant spinal stenosis. Mild to moderate left greater than right L3 foraminal narrowing, stable. L4-5: Degenerative intervertebral disc space narrowing with diffuse disc bulge and disc desiccation. Associated reactive endplate changes. Superimposed small central to right subarticular disc protrusion with inferior migration (series 3, image 10), stable from previous. Protruding disc closely approximates the right greater than left descending L5 nerve roots, either which could be affected. The right L5 nerve root is slightly displaced posteriorly. Prior posterior decompression. Residual moderate facet hypertrophy. Mild to moderate bilateral lateral recess narrowing without significant spinal stenosis. Severe right with moderate left L4 foraminal narrowing. Appearance is relatively stable. L5-S1: Diffuse disc bulge with disc  desiccation and intervertebral disc space narrowing. Associated reactive endplate spurring. Superimposed small central disc protrusion with slight inferior migration. Mild to moderate bilateral facet hypertrophy. Mild narrowing of the lateral recesses bilaterally. Central canal remains patent. 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 Appearance is stable. IMPRESSION: 1. No evidence for cord compression or other acute abnormality within the lumbar spine. Relatively stable appearance as compared to most recent MRI from 06/21/2020. Stable severe multilevel degenerative spondylosis with severe foraminal stenosis on the right at L4-5 and bilaterally at L5-S1. 2. More moderate foraminal narrowing bilaterally at L3-4 and on the left at L4-5. 3. Central to right subarticular disc protrusion at L4-5, potentially irritating either of the descending L5 nerve roots, right greater than left. Electronically Signed   By: Rise MuBenjamin  McClintock M.D.   On: 08/26/2020 22:45    EKG: Independently reviewed.  Sinus tachycardia.  Assessment/Plan Principal Problem:   Acute on chronic diastolic CHF (congestive heart failure) (HCC) Active  Problems:   Movement disorder   Hypertension   Parkinson disease (HCC)   Acute respiratory failure with hypoxemia (HCC)   Acute CHF (congestive heart failure) (HCC)    1. Acute on chronic diastolic CHF last EF measured was in August 2021 50 to 60% with grade 1 diastolic dysfunction.  Patient states he has gained at least 20 pounds in the last few days.  Increasing peripheral edema and on exam patient does have at least 2+ pitting edema both lower extremities.  We will keep patient on Lasix 80 mg IV every 12 closely follow intake output metabolic panel daily weights.  Patient does have some elevation of the right hemidiaphragm which could be contributing to some of the symptoms. 2. CAD status post stenting denies any chest pain.  We will continue patient's statins beta-blockers and  patient is on Pradaxa and antiplatelet agents. 3. Parkinson's disease on Sinemet. 4. Paroxysmal atrial fibrillation on Pradaxa and beta-blockers 5. History of B12 deficiency on B12 shots every month. 6. UTI on ceftriaxone.  Since patient has acute respiratory failure with bilateral lower extremity edema will need to close monitoring and inpatient status.   DVT prophylaxis: Pradaxa. Code Status: Full code. Family Communication: Discussed with patient. Disposition Plan: Home when stable. Consults called: None. Admission status: Inpatient.   Eduard Clos MD [facility] Pager [phone].  If 7PM-7AM, please contact night-coverage [email or web address] Password [facility]  08/27/2020, 3:39 AM

## 2020-08-27 NOTE — ED Notes (Signed)
Attempted to call report

## 2020-08-27 NOTE — ED Provider Notes (Signed)
Patient signed out to me at shift change.  Patient SOB with oxygen requirement.  Hx of CHF.  Worsening peripheral edema.  Given lasix.  Urinating some, but not a lot.  UA concerning for UTI.  PE study negative.  Appreciate Dr. Toniann Fail for admitting.   Roxy Horseman, PA-C 08/27/20 0116    Dione Booze, MD 08/27/20 [phone]

## 2020-08-27 NOTE — ED Notes (Signed)
Pt c/o  New Lower left back pain. Pain described as sharp/stabbing pain. Provider will be made aware.

## 2020-08-27 NOTE — Progress Notes (Addendum)
PROGRESS NOTE    Raymond White  LKG:[id number] DOB: September 02, 1950 DOA: 08/26/2020 PCP: Judeth Horn, PA-C  Status is: Inpatient  Remains inpatient appropriate because:IV treatments appropriate due to intensity of illness or inability to take PO   Dispo: The patient is from: Home              Anticipated d/c is to: TBD- possibly SNF              Anticipated d/c date is: 2 days              Patient currently is not medically stable to d/c. Brief Narrative:  70 year old with history significant for coronary artery disease status post stenting, atrial fibrillation,  heart failure with preserved ejection fraction, Parkinson's disease,  and cerebrovascular disease presenting with acute hypoxemic respiratory failure likely secondary to exacerbation of his heart failure.  The patient was admitted for IV diuresis.  Assessment & Plan:   Principal Problem:   Acute on chronic diastolic CHF (congestive heart failure) (HCC) Active Problems:   Movement disorder   Hypertension   Parkinson disease (HCC)   Acute respiratory failure with hypoxemia (HCC)   Acute CHF (congestive heart failure) (HCC)  Acute exacerbation of heart failure with preserved ejection fraction, last echo reviewed which showed a normal ejection fraction with diastolic dysfunction.  No symptoms of ischemia and initial troponin was negative.  The patient reports nonadherence to medications for several days as his caregiver is gone. -Good response to IV Lasix 80 this morning, will redose this afternoon. -Daily weights strict I's and O's and heart healthy diet. -Carvedilol 6.25 mg twice daily. -Could consider addition of SGLT2 inhibitor for optimization of his regimen.  Intermittent low back pain, doubt truly UTI will pause ceftriaxone at this time and await urine culture.  Have known patient previously and does have chronic ongoing back pain MRI negative for acute changes or new findings.  Follow-up culture and treat as  indicated clinically.Tylenol, Lidoderm patch.  Would avoid narcotics given his age and high fall risk.  Atrial fibrillation, now on Pradaxa since last hospital stay,likely paroxsymal, sinus on admission. - Telemetry  - Continue anticoagulation and carvedilol  - Confirm dosing of dabigatran dosing with pharmacy (called 1/8, please confirm 1/9).   Polypharmacy, the patient is on both an SSRI and SNRI.  I have messaged pharmacy to confirm this finding Medicare registry of medications dispensed.Will follow up--could consider tapering Cymbalta incredibly slowly given anxiety.   Parkinson's disease- continue Sinemet, avoid anti psychotics   CAD, denies chest pain, troponin flat, continue atorvastatin, aspirin, clopidogrel. Query as to why on triple therapy, would recommend outpatient adjustment.   Hypokalemia- repleted  Macrocytic anemia- stable, chronic   Thrombocytopenia- also chronic, monitor, question if component of marrow suppression. Had prior B12/ folate which were normal.   Elevated total bilirubin- hepatic function in AM, consider ultrasound if not improving, suspect congestive hepatopathy.   DVT prophylaxis: Dabigatran   Code Status: FULL  Family Communication: None-- family only has caregiver  Disposition Plan: PT/OT ordered    Consultants:   none  Procedures:   None  Antimicrobials:   Ceftriaxone 1/8     Subjective: Patient reports ongoing stabbing left-sided pain this is intermittent and only when he moves.  He denies dysuria or hematuria.  He is pleased with the results of his back pain.  He is worried he is not produce enough urine to the 60 of IV Lasix that was given previously.  Objective:  Vitals:   08/27/20 0645 08/27/20 0645 08/27/20 0736 08/27/20 0738  BP:   (!) 144/82 (!) 144/82  Pulse: 90  94 89  Resp: (!) 21   (!) 21  Temp:  98 F (36.7 C)  98.2 F (36.8 C)  TempSrc:  Oral  Oral  SpO2: 94%   93%  Weight:      Height:        Intake/Output  Summary (Last 24 hours) at 08/27/2020 0801 Last data filed at 08/27/2020 0053 Gross per 24 hour  Intake 100 ml  Output 800 ml  Net -700 ml   Filed Weights   08/26/20 0929  Weight: 111.1 kg    Examination:  General exam: Anxious gentleman in no distress. Respiratory system: Able to speak in full sentences but does have bilateral crackles at the bases. Cardiovascular system: S1 & S2 heard, soft systolic murmur at left lower sternal border.  JVP is visible to mid neck. Gastrointestinal system: Abdomen is nondistended, soft and nontender. No organomegaly or masses felt. Normal bowel sounds heard. Skin: 2+ lower extremity pitting edema  Data Reviewed: I have personally reviewed following labs and imaging studies  CBC: Recent Labs  Lab 08/26/20 0933 08/26/20 1900  WBC 7.7  --   HGB 15.5 13.9  HCT 46.9 41.0  MCV 101.7*  --   PLT 143*  --    Basic Metabolic Panel: Recent Labs  Lab 08/26/20 0933 08/26/20 1900  NA 136 136  K 4.9 4.3  CL 101  --   CO2 18*  --   GLUCOSE 102*  --   BUN 12  --   CREATININE 0.93  --   CALCIUM 8.6*  --    GFR: Estimated Creatinine Clearance: 98 mL/min (by C-G formula based on SCr of 0.93 mg/dL). Liver Function Tests: No results for input(s): AST, ALT, ALKPHOS, BILITOT, PROT, ALBUMIN in the last 168 hours. No results for input(s): LIPASE, AMYLASE in the last 168 hours. No results for input(s): AMMONIA in the last 168 hours. Coagulation Profile: No results for input(s): INR, PROTIME in the last 168 hours. Cardiac Enzymes: No results for input(s): CKTOTAL, CKMB, CKMBINDEX, TROPONINI in the last 168 hours. BNP (last 3 results) No results for input(s): PROBNP in the last 8760 hours. HbA1C: No results for input(s): HGBA1C in the last 72 hours. CBG: No results for input(s): GLUCAP in the last 168 hours. Lipid Profile: No results for input(s): CHOL, HDL, LDLCALC, TRIG, CHOLHDL, LDLDIRECT in the last 72 hours. Thyroid Function Tests: No results  for input(s): TSH, T4TOTAL, FREET4, T3FREE, THYROIDAB in the last 72 hours. Anemia Panel: No results for input(s): VITAMINB12, FOLATE, FERRITIN, TIBC, IRON, RETICCTPCT in the last 72 hours. Sepsis Labs: Recent Labs  Lab 08/26/20 2329  LATICACIDVEN 1.2    Recent Results (from the past 240 hour(s))  Resp Panel by RT-PCR (Flu A&B, Covid) Nasopharyngeal Swab     Status: None   Collection Time: 08/26/20  5:25 PM   Specimen: Nasopharyngeal Swab; Nasopharyngeal(NP) swabs in vial transport medium  Result Value Ref Range Status   SARS Coronavirus 2 by RT PCR NEGATIVE NEGATIVE Final    Comment: (NOTE) SARS-CoV-2 target nucleic acids are NOT DETECTED.  The SARS-CoV-2 RNA is generally detectable in upper respiratory specimens during the acute phase of infection. The lowest concentration of SARS-CoV-2 viral copies this assay can detect is 138 copies/mL. A negative result does not preclude SARS-Cov-2 infection and should not be used as the sole basis for treatment or  other patient management decisions. A negative result may occur with  improper specimen collection/handling, submission of specimen other than nasopharyngeal swab, presence of viral mutation(s) within the areas targeted by this assay, and inadequate number of viral copies(<138 copies/mL). A negative result must be combined with clinical observations, patient history, and epidemiological information. The expected result is Negative.  Fact Sheet for Patients:  BloggerCourse.comhttps://www.fda.gov/media/152166/download  Fact Sheet for Healthcare Providers:  SeriousBroker.ithttps://www.fda.gov/media/152162/download  This test is no t yet approved or cleared by the [place] FDA and  has been authorized for detection and/or diagnosis of SARS-CoV-2 by FDA under an Emergency Use Authorization (EUA). This EUA will remain  in effect (meaning this test can be used) for the duration of the COVID-19 declaration under Section 564(b)(1) of the Act, 21 U.S.C.section  360bbb-3(b)(1), unless the authorization is terminated  or revoked sooner.       Influenza A by PCR NEGATIVE NEGATIVE Final   Influenza B by PCR NEGATIVE NEGATIVE Final    Comment: (NOTE) The Xpert Xpress SARS-CoV-2/FLU/RSV plus assay is intended as an aid in the diagnosis of influenza from Nasopharyngeal swab specimens and should not be used as a sole basis for treatment. Nasal washings and aspirates are unacceptable for Xpert Xpress SARS-CoV-2/FLU/RSV testing.  Fact Sheet for Patients: BloggerCourse.comhttps://www.fda.gov/media/152166/download  Fact Sheet for Healthcare Providers: SeriousBroker.ithttps://www.fda.gov/media/152162/download  This test is not yet approved or cleared by the [place] FDA and has been authorized for detection and/or diagnosis of SARS-CoV-2 by FDA under an Emergency Use Authorization (EUA). This EUA will remain in effect (meaning this test can be used) for the duration of the COVID-19 declaration under Section 564(b)(1) of the Act, 21 U.S.C. section 360bbb-3(b)(1), unless the authorization is terminated or revoked.  Performed at [facility], [address] [place], [place] [address]   Blood culture (routine x 2)     Status: None (Preliminary result)   Collection Time: 08/26/20 11:29 PM   Specimen: BLOOD LEFT ARM  Result Value Ref Range Status   Specimen Description BLOOD LEFT ARM  Final   Special Requests   Final    BOTTLES DRAWN AEROBIC AND ANAEROBIC Blood Culture results may not be optimal due to an inadequate volume of blood received in culture bottles Performed at [facility], [address] [place], [place] [address]    Culture PENDING  Incomplete   Report Status PENDING  Incomplete         Radiology Studies: DG Chest 2 View  Result Date: 08/26/2020 CLINICAL DATA:  Shortness of breath, worse for 2 days EXAM: CHEST - 2 VIEW COMPARISON:  March 28, 2020 FINDINGS: Trachea midline. Cardiomediastinal contours and hilar structures are normal. No lobar  consolidative changes. Basilar atelectasis and signs of pleural and parenchymal scarring noted in the RIGHT chest as before. LEFT chest is clear. Signs of prior rib resection involving RIGHT posterolateral rib 7. On limited assessment no acute skeletal process. IMPRESSION: Basilar atelectasis and signs of pleural and parenchymal scarring in the RIGHT chest related to postoperative changes as before. Electronically Signed   By: Donzetta KohutGeoffrey  Wile M.D.   On: 08/26/2020 09:54   CT Angio Chest PE W/Cm &/Or Wo Cm  Result Date: 08/27/2020 CLINICAL DATA:  Chest pain or SOB, pleurisy or effusion suspected Shortness of breath, worse for the past 2 days. EXAM: CT ANGIOGRAPHY CHEST WITH CONTRAST TECHNIQUE: Multidetector CT imaging of the chest was performed using the standard protocol during bolus administration of intravenous contrast. Multiplanar CT image reconstructions  and MIPs were obtained to evaluate the vascular anatomy. CONTRAST:  OMNIPAQUE IOHEXOL 350 MG/ML SOLN COMPARISON:  Radiograph earlier today.  Chest CTA 06/24/2019 FINDINGS: Cardiovascular: Motion artifact particularly through the lung bases. Allowing for motion artifact, no evidence of acute pulmonary embolus. Aortic atherosclerosis and tortuosity. No dissection or acute aortic findings. Common origin of the brachiocephalic and left common carotid artery, variant arch anatomy. Heart is normal in size. There are coronary artery calcifications. No pericardial effusion. Mediastinum/Nodes: Small mediastinal and hilar lymph nodes not enlarged by size criteria. Small hiatal hernia. Upper esophagus is patulous. No suspicious thyroid nodule. Lungs/Pleura: Chronic elevation of the right hemidiaphragm with postsurgical or remote posttraumatic change of right ribs and chronic basilar consolidation, hyperdensity abutting the hemidiaphragm may be secondary to prior talc pleurodesis. Findings are stable from prior exam. Chronic atelectasis in the dependent right lower  lobe and right middle lobe. No evidence of pneumonia or acute airspace disease. No pleural fluid. No findings of pulmonary edema. Trachea and central bronchi are patent. Upper Abdomen: Cholecystectomy.  No acute upper abdominal findings. Musculoskeletal: Postsurgical versus remote posttraumatic change of lower right ribs. Partial ankylosis at the thoracolumbar junction, chronic. Diffuse degenerative change in the thoracic spine. Review of the MIP images confirms the above findings. IMPRESSION: 1. No acute pulmonary embolus or acute intrathoracic abnormality. 2. Chronic elevation of the right hemidiaphragm with postsurgical or remote posttraumatic change of right ribs and chronic basilar consolidation. Chronic hyperdensity abutting the hemidiaphragm may be secondary to prior talc pleurodesis. Chronic atelectasis in the dependent right lower lobe and right middle lobe. 3. Coronary artery calcifications. Aortic Atherosclerosis (ICD10-I70.0). Electronically Signed   By: Narda Rutherford M.D.   On: 08/27/2020 00:02   MR LUMBAR SPINE WO CONTRAST  Result Date: 08/26/2020 CLINICAL DATA:  Initial evaluation for acute low back pain, cauda equina syndrome suspected. EXAM: MRI LUMBAR SPINE WITHOUT CONTRAST TECHNIQUE: Multiplanar, multisequence MR imaging of the lumbar spine was performed. No intravenous contrast was administered. COMPARISON:  Previous MRI from 06/21/2020 FINDINGS: Segmentation:  Examination degraded by motion artifact. Standard segmentation. Lowest well-formed disc space labeled the L5-S1 level. Alignment: Mild retrolisthesis of L2 on L3 and L3 on L4, stable. Underlying dextroscoliosis. Appearance is stable from previous. Vertebrae: Vertebral body height maintained without acute or interval fracture. T11 and T12 vertebral bodies are partially ankylosed posteriorly. Bone marrow signal intensity diffusely heterogeneous but within normal limits. No worrisome osseous lesions. Discogenic reactive endplate change  present throughout the lumbar spine, most pronounced at L4-5 on the right. Conus medullaris and cauda equina: Conus extends to the L1 level. Conus and cauda equina appear normal. Paraspinal and other soft tissues: Chronic postoperative changes present within the posterior paraspinous soft tissues. Approximate 1 cm simple exophytic cyst noted extending from the interpolar right kidney. Additional 4.6 cm simple cyst present at the lower pole of the right kidney. Extrarenal pelvises noted bilaterally. Visualized visceral structures otherwise unremarkable. Disc levels: T12-L1: Mild disc bulge with disc desiccation. Superimposed small right subarticular disc protrusion indents the right ventral thecal sac (series 8, image 5). Mild facet hypertrophy. No significant spinal stenosis. Foramina remain patent. L1-2: Degenerative intervertebral disc space narrowing with mild disc bulge. Discogenic reactive endplate change. Mild bilateral facet hypertrophy. No significant spinal stenosis. Foramina remain patent. L2-3: Retrolisthesis. Advanced degenerative intervertebral disc space narrowing with diffuse disc bulge, asymmetric to the left. Associated prominent reactive endplate change with marginal endplate osteophytic spurring. Prior posterior decompression. Residual mild facet hypertrophy. No significant spinal stenosis. Mild to  moderate bilateral foraminal narrowing. Appearance is stable. L3-4: Retrolisthesis. Advanced degenerative intervertebral disc space narrowing with diffuse disc bulge. Associated discogenic reactive endplate change with marginal endplate osteophytic spurring, greater on the left. Prior posterior decompression. Residual right greater than left facet hypertrophy. Stable thecal sac patency without significant spinal stenosis. Mild to moderate left greater than right L3 foraminal narrowing, stable. L4-5: Degenerative intervertebral disc space narrowing with diffuse disc bulge and disc desiccation. Associated  reactive endplate changes. Superimposed small central to right subarticular disc protrusion with inferior migration (series 3, image 10), stable from previous. Protruding disc closely approximates the right greater than left descending L5 nerve roots, either which could be affected. The right L5 nerve root is slightly displaced posteriorly. Prior posterior decompression. Residual moderate facet hypertrophy. Mild to moderate bilateral lateral recess narrowing without significant spinal stenosis. Severe right with moderate left L4 foraminal narrowing. Appearance is relatively stable. L5-S1: Diffuse disc bulge with disc desiccation and intervertebral disc space narrowing. Associated reactive endplate spurring. Superimposed small central disc protrusion with slight inferior migration. Mild to moderate bilateral facet hypertrophy. Mild narrowing of the lateral recesses bilaterally. Central canal remains patent. 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 Appearance is stable. IMPRESSION: 1. No evidence for cord compression or other acute abnormality within the lumbar spine. Relatively stable appearance as compared to most recent MRI from 06/21/2020. Stable severe multilevel degenerative spondylosis with severe foraminal stenosis on the right at L4-5 and bilaterally at L5-S1. 2. More moderate foraminal narrowing bilaterally at L3-4 and on the left at L4-5. 3. Central to right subarticular disc protrusion at L4-5, potentially irritating either of the descending L5 nerve roots, right greater than left. Electronically Signed   By: Rise Mu M.D.   On: 08/26/2020 22:45        Scheduled Meds: . aspirin  81 mg Oral Daily  . atorvastatin  80 mg Oral QPM  . busPIRone  10 mg Oral TID  . carbidopa-levodopa  2 tablet Oral TID  . carvedilol  6.25 mg Oral BID WC  . cholecalciferol  1,000 Units Oral Daily  . citalopram  10 mg Oral QHS  . clopidogrel  75 mg Oral Daily  . dabigatran  75 mg Oral Daily   . DULoxetine  90 mg Oral Daily  . famotidine  20 mg Oral Daily  . FLUoxetine  20 mg Oral Daily  . furosemide  80 mg Intravenous Q12H  . pantoprazole  40 mg Oral Daily  . potassium chloride SA  20 mEq Oral Daily  . tamsulosin  0.4 mg Oral QHS  . thiamine  100 mg Oral Daily   Continuous Infusions: . cefTRIAXone (ROCEPHIN)  IV       LOS: 0 days    Time spent: 35 minutes   No charge     Terisa Starr, MD   [facility] Pager [phone]  If 7PM-7AM, please contact night-coverage [email or web address] Password [email or web address] 08/27/2020, 8:01 AM

## 2020-08-27 NOTE — ED Notes (Signed)
Tele; Breakfast Order placed 

## 2020-08-28 ENCOUNTER — Inpatient Hospital Stay (HOSPITAL_COMMUNITY): Payer: Medicare Other

## 2020-08-28 DIAGNOSIS — I2510 Atherosclerotic heart disease of native coronary artery without angina pectoris: Secondary | ICD-10-CM | POA: Diagnosis not present

## 2020-08-28 DIAGNOSIS — I10 Essential (primary) hypertension: Secondary | ICD-10-CM

## 2020-08-28 DIAGNOSIS — R0600 Dyspnea, unspecified: Secondary | ICD-10-CM

## 2020-08-28 LAB — BASIC METABOLIC PANEL
Anion gap: 11 (ref 5–15)
BUN: 18 mg/dL (ref 8–23)
CO2: 27 mmol/L (ref 22–32)
Calcium: 8.5 mg/dL — ABNORMAL LOW (ref 8.9–10.3)
Chloride: 101 mmol/L (ref 98–111)
Creatinine, Ser: 0.95 mg/dL (ref 0.61–1.24)
GFR, Estimated: >60 mL/min (ref >60)
Glucose, Bld: 119 mg/dL — ABNORMAL HIGH (ref 70–99)
Potassium: 3.5 mmol/L (ref 3.5–5.1)
Sodium: 139 mmol/L (ref 135–145)

## 2020-08-28 LAB — HEPATIC FUNCTION PANEL
ALT: <5 U/L (ref 0–44)
AST: 18 U/L (ref 15–41)
Albumin: 3.2 g/dL — ABNORMAL LOW (ref 3.5–5.0)
Alkaline Phosphatase: 75 U/L (ref 38–126)
Bilirubin, Direct: 0.2 mg/dL (ref 0.0–0.2)
Indirect Bilirubin: 1.4 mg/dL — ABNORMAL HIGH (ref 0.3–0.9)
Total Bilirubin: 1.6 mg/dL — ABNORMAL HIGH (ref 0.3–1.2)
Total Protein: 5.6 g/dL — ABNORMAL LOW (ref 6.5–8.1)

## 2020-08-28 LAB — CBC
HCT: 39.6 % (ref 39.0–52.0)
Hemoglobin: 13.5 g/dL (ref 13.0–17.0)
MCH: 34.6 pg — ABNORMAL HIGH (ref 26.0–34.0)
MCHC: 34.1 g/dL (ref 30.0–36.0)
MCV: 101.5 fL — ABNORMAL HIGH (ref 80.0–100.0)
Platelets: 111 10*3/uL — ABNORMAL LOW (ref 150–400)
RBC: 3.9 MIL/uL — ABNORMAL LOW (ref 4.22–5.81)
RDW: 13.1 % (ref 11.5–15.5)
WBC: 5.3 10*3/uL (ref 4.0–10.5)
nRBC: 0 % (ref 0.0–0.2)

## 2020-08-28 NOTE — Plan of Care (Signed)
?  Problem: Clinical Measurements: ?Goal: Will remain free from infection ?Outcome: Progressing ?  ?

## 2020-08-28 NOTE — Consult Note (Signed)
Cardiology Consultation:   Patient ID: Raymond KatosRichard E White MRN: [id number]; DOB: 12-28-1950  Admit date: 08/26/2020 Date of Consult: 08/28/2020  Primary Care Provider: Judeth Hornamsey, Robert M, PA-C [facility] Cardiologist: No primary care provider on file.  None [facility] Electrophysiologist:  None    Patient Profile:   Raymond White is a 70 y.o. male with a hx of congestive heart failure who is being seen today for the evaluation of shortness of breath at the request of Dr Luberta Robertsonhatterjee.  History of Present Illness:   Mr. Raymond White has a long history of coronary disease and Parkinson's disease, atrial fibrillation and prior stroke.  He has a history of preserved left ventricular systolic function and an elevated diaphragm.  The patient notes that over the last several weeks, he has had progressive dyspnea with exertion.  There is been no chest pressure.  No arm or neck or shoulder or jaw pain.  As his symptoms worsen, he presented for additional evaluation.  Cardiac enzymes have been negative.  He has been treated with intravenous diuretic therapy.  He notes that his weight increased.  A CT scan of the chest was done which did not suggest evidence of pulmonary vascular hypertension.  CT angiogram demonstrated no evidence of pulmonary embolism.  He denies a history of tobacco use.   Past Medical History:  Diagnosis Date  . Acute cystitis with hematuria   . Acute midline low back pain with sciatica   . CHF (congestive heart failure) (HCC)   . Chronic heart failure with preserved ejection fraction (HCC)   . DDD (degenerative disc disease), lumbar   . Depressive disorder   . Drug induced constipation   . GERD (gastroesophageal reflux disease)   . History of TIA (transient ischemic attack)   . Hypertension   . Pancreatitis   . Parkinson's disease (HCC) 2015  . Stroke (HCC)   . Uncomplicated alcohol dependence (HCC)   . Urinary incontinence     Past Surgical History:  Procedure  Laterality Date  . APPENDECTOMY    . BACK SURGERY     x5  . CHOLECYSTECTOMY    . COLONOSCOPY    . ESOPHAGOGASTRODUODENOSCOPY    . KNEE ARTHROSCOPY    . neck fusion       Home Medications:  Prior to Admission medications   Medication Sig Start Date End Date Taking? Authorizing Provider  aspirin 81 MG chewable tablet Chew 81 mg by mouth daily.   Yes [provider]  carbidopa-levodopa (SINEMET IR) 25-100 MG tablet Take 2.5 tablets by mouth 3 (three) times daily. Take at 0900, 1300 and 1700   Yes [provider]  carvedilol (COREG) 6.25 MG tablet Take 3.125 mg by mouth 2 (two) times daily with a meal. Hold Dose for Systolic Pressure <100 or Pulse <50   Yes [provider]  cholecalciferol (VITAMIN D3) 25 MCG (1000 UNIT) tablet Take 1,000 Units by mouth daily.   Yes [provider]  DULoxetine (CYMBALTA) 30 MG capsule Take 30 mg by mouth 2 (two) times daily.   Yes [provider]  empagliflozin (JARDIANCE) 25 MG TABS tablet Take 12.5 mg by mouth daily.   Yes [provider]  Fluticasone-Salmeterol (ADVAIR) 250-50 MCG/DOSE AEPB Inhale 1 puff into the lungs 2 (two) times daily.   Yes [provider]  furosemide (LASIX) 80 MG tablet Take 80 mg by mouth daily. May Take Extra 1/2 tablet (40 mg) to attain "Dry Weight" of 125-127 lbs   Yes  [provider]  gabapentin (NEURONTIN) 600 MG tablet Take 600-1,200 mg by mouth 2 (two) times daily. Take 1 tablet (600 mg) in the morning and Take 2 tablets (1200 mg) every evening   Yes [provider]  Magnesium Oxide 420 MG TABS Take 1 tablet by mouth in the morning and at bedtime.   Yes [provider]  potassium chloride SA (K-DUR,KLOR-CON) 20 MEQ tablet Take 20 mEq by mouth 2 (two) times daily. With food   Yes [provider]  rasagiline (AZILECT) 0.5 MG TABS tablet Take 0.5 mg by mouth as directed. Take 1 tablet (0.5 mg) Daily for 2 Weeks, Then Take 2 tablets  (1 gm) Daily   Yes [provider]  rosuvastatin (CRESTOR) 40 MG tablet Take 40 mg by mouth daily.   Yes [provider]  spironolactone (ALDACTONE) 25 MG tablet Take 25 mg by mouth daily.   Yes [provider]  tamsulosin (FLOMAX) 0.4 MG CAPS capsule Take 0.4 mg by mouth at bedtime. Hold Dose For Systolic Pressure<100   Yes [provider]  vitamin B-12 (CYANOCOBALAMIN) 500 MCG tablet Take 1,000 mcg by mouth daily.   Yes [provider]  acetaminophen (TYLENOL) 325 MG tablet Take 650 mg by mouth every 6 (six) hours as needed for fever. Fever > 100.4 Degrees. Do Not Exceed 3000 MG/24 Hours    [provider]  albuterol (PROVENTIL HFA;VENTOLIN HFA) 108 (90 Base) MCG/ACT inhaler Inhale 2 puffs into the lungs every 6 (six) hours as needed for wheezing or shortness of breath. Wait 1 minute in between each puff    [provider]  bisacodyl (BISACODYL) 5 MG EC tablet Take 5 mg by mouth daily as needed for moderate constipation.    [provider]  docusate sodium (COLACE) 100 MG capsule Take 1 capsule (100 mg total) by mouth as needed for mild constipation. Patient taking differently: Take 100 mg by mouth 2 (two) times daily as needed for mild constipation. 04/01/20   Littie DeedsSun, Doniven, MD  EPINEPHrine 0.3 mg/0.3 mL IJ SOAJ injection Inject 0.3 mg into the muscle daily as needed for anaphylaxis.    [provider]  MENTHOL-METHYL SALICYLATE EX Apply 1 application topically 4 (four) times daily as needed (pain). 10-15% Topical Cream    [provider]  omeprazole (PRILOSEC) 20 MG capsule Take 20 mg by mouth daily as needed (heartburn/reflux).    [provider]  sildenafil (VIAGRA) 50 MG tablet Take 50 mg by mouth daily as needed for erectile dysfunction. Take 1 hour prior to sexual activity*DO NOT EXCEED 1 DOSE PER 24 HOUR PERIOD*    [provider]  tiZANidine (ZANAFLEX) 4 MG tablet Take 4 mg by mouth 3  (three) times daily as needed for muscle spasms. Avoid Taking if Excessive Drowsiness or Difficulty Breathing; Caution Drowsiness-Avoid Driving, Operating Machinery, Designer, industrial/productClimbing    [provider]    Inpatient Medications: Scheduled Meds: . aspirin  81 mg Oral Daily  . atorvastatin  80 mg Oral QPM  . busPIRone  10 mg Oral TID  . carbidopa-levodopa  2 tablet Oral TID  . carvedilol  6.25 mg Oral BID WC  . cholecalciferol  1,000 Units Oral Daily  . citalopram  10 mg Oral QHS  . clopidogrel  75 mg Oral Daily  . dabigatran  75 mg Oral Daily  . DULoxetine  90 mg Oral Daily  . famotidine  20 mg Oral Daily  . FLUoxetine  20 mg Oral Daily  .  pantoprazole  40 mg Oral Daily  . potassium chloride SA  20 mEq Oral Daily  . ramelteon  8 mg Oral QHS  . tamsulosin  0.4 mg Oral QHS  . thiamine  100 mg Oral Daily   Continuous Infusions:  PRN Meds: acetaminophen **OR** acetaminophen, albuterol, docusate sodium, ondansetron  Allergies:    Allergies  Allergen Reactions  . Fish-Derived Products Anaphylaxis  . Zoloft [Sertraline Hcl] Rash  . Sulfamethizole Rash    Social History:   Social History   Socioeconomic History  . Marital status: Single    Spouse name: Not on file  . Number of children: 0  . Years of education: Not on file  . Highest education level: Not on file  Occupational History  . Occupation: retired  Tobacco Use  . Smoking status: Never Smoker  . Smokeless tobacco: Never Used  Vaping Use  . Vaping Use: Never used  Substance and Sexual Activity  . Alcohol use: Not Currently    Comment: Vodka  . Drug use: Never  . Sexual activity: Not on file  Other Topics Concern  . Not on file  Social History Narrative   Widowed.  Never smoker.  Has history of alcohol abuse with vodka being beverage of choice.   Social Determinants of Health   Financial Resource Strain: Not on file  Food Insecurity: Not on file  Transportation Needs: Not on file  Physical Activity: Not  on file  Stress: Not on file  Social Connections: Not on file  Intimate Partner Violence: Not on file    Family History:    Family History  Problem Relation Age of Onset  . Hyperlipidemia Mother   . Colon cancer Father      ROS:  Please see the history of present illness.   All other ROS reviewed and negative.     Physical Exam/Data:   Vitals:   08/28/20 0519 08/28/20 0726 08/28/20 0940 08/28/20 1152  BP: 103/65 116/67 108/69   Pulse: 68 67 75   Resp: 16 20    Temp: 98 F (36.7 C) 97.8 F (36.6 C)    TempSrc: Oral Oral    SpO2: 99% 97%  95%  Weight:      Height:        Intake/Output Summary (Last 24 hours) at 08/28/2020 1502 Last data filed at 08/28/2020 1340 Gross per 24 hour  Intake 360 ml  Output 1980 ml  Net -1620 ml   Last 3 Weights 08/27/2020 08/27/2020 08/26/2020  Weight (lbs) 228 lb 11.2 oz 228 lb 11.2 oz 245 lb  Weight (kg) 103.738 kg 103.738 kg 111.131 kg     Body mass index is 30.17 kg/m.  General:  Well nourished, well developed, in no acute distress HEENT: normal Lymph: no adenopathy Neck: no JVD Endocrine:  No thryomegaly Vascular: No carotid bruits; FA pulses 2+ bilaterally without bruits  Cardiac:  normal S1, S2; RRR; no murmur  Lungs:  clear to auscultation bilaterally, no wheezing, rhonchi or rales  Abd: soft, nontender, no hepatomegaly  Ext: no edema Musculoskeletal:  No deformities, BUE and BLE strength normal and equal Skin: warm and dry  Neuro:  CNs 2-12 intact, no focal abnormalities noted Psych:  Normal affect   EKG:  The EKG was personally reviewed and demonstrates: Normal sinus rhythm with poor R wave progression Telemetry:  Telemetry was personally reviewed and demonstrates: Normal sinus rhythm  Relevant CV Studies: None  Laboratory Data:  High Sensitivity Troponin:   Recent Labs  Lab 08/26/20 0933 08/26/20 1904  TROPONINIHS 9 11     Chemistry Recent Labs  Lab 08/26/20 0933 08/26/20 1900 08/27/20 0735 08/28/20 0302   NA 136 136 140 139  K 4.9 4.3 3.6 3.5  CL 101  --  99 101  CO2 18*  --  25 27  GLUCOSE 102*  --  95 119*  BUN 12  --  14 18  CREATININE 0.93  --  1.07 0.95  CALCIUM 8.6*  --  8.3* 8.5*  GFRNONAA >60  --  >60 >60  ANIONGAP 17*  --  16* 11    Recent Labs  Lab 08/27/20 0735 08/28/20 0302  PROT 5.8* 5.6*  ALBUMIN 3.2* 3.2*  AST 21 18  ALT 18 <5  ALKPHOS 84 75  BILITOT 2.5* 1.6*   Hematology Recent Labs  Lab 08/26/20 0933 08/26/20 1900 08/27/20 0735 08/28/20 0302  WBC 7.7  --  6.6 5.3  RBC 4.61  --  4.06* 3.90*  HGB 15.5 13.9 14.6 13.5  HCT 46.9 41.0 41.7 39.6  MCV 101.7*  --  102.7* 101.5*  MCH 33.6  --  36.0* 34.6*  MCHC 33.0  --  35.0 34.1  RDW 13.0  --  13.2 13.1  PLT 143*  --  113* 111*   BNP Recent Labs  Lab 08/26/20 1657  BNP 124.4*    DDimer No results for input(s): DDIMER in the last 168 hours.   Radiology/Studies:  DG Chest 2 View  Result Date: 08/26/2020 CLINICAL DATA:  Shortness of breath, worse for 2 days EXAM: CHEST - 2 VIEW COMPARISON:  March 28, 2020 FINDINGS: Trachea midline. Cardiomediastinal contours and hilar structures are normal. No lobar consolidative changes. Basilar atelectasis and signs of pleural and parenchymal scarring noted in the RIGHT chest as before. LEFT chest is clear. Signs of prior rib resection involving RIGHT posterolateral rib 7. On limited assessment no acute skeletal process. IMPRESSION: Basilar atelectasis and signs of pleural and parenchymal scarring in the RIGHT chest related to postoperative changes as before. Electronically Signed   By: Donzetta Kohut M.D.   On: 08/26/2020 09:54   CT Angio Chest PE W/Cm &/Or Wo Cm  Result Date: 08/27/2020 CLINICAL DATA:  Chest pain or SOB, pleurisy or effusion suspected Shortness of breath, worse for the past 2 days. EXAM: CT ANGIOGRAPHY CHEST WITH CONTRAST TECHNIQUE: Multidetector CT imaging of the chest was performed using the standard protocol during bolus administration of  intravenous contrast. Multiplanar CT image reconstructions and MIPs were obtained to evaluate the vascular anatomy. CONTRAST:  OMNIPAQUE IOHEXOL 350 MG/ML SOLN COMPARISON:  Radiograph earlier today.  Chest CTA 06/24/2019 FINDINGS: Cardiovascular: Motion artifact particularly through the lung bases. Allowing for motion artifact, no evidence of acute pulmonary embolus. Aortic atherosclerosis and tortuosity. No dissection or acute aortic findings. Common origin of the brachiocephalic and left common carotid artery, variant arch anatomy. Heart is normal in size. There are coronary artery calcifications. No pericardial effusion. Mediastinum/Nodes: Small mediastinal and hilar lymph nodes not enlarged by size criteria. Small hiatal hernia. Upper esophagus is patulous. No suspicious thyroid nodule. Lungs/Pleura: Chronic elevation of the right hemidiaphragm with postsurgical or remote posttraumatic change of right ribs and chronic basilar consolidation, hyperdensity abutting the hemidiaphragm may be secondary to prior talc pleurodesis. Findings are stable from prior exam. Chronic atelectasis in the dependent right lower lobe and right middle lobe. No evidence of pneumonia or acute airspace disease. No pleural fluid. No findings of pulmonary edema. Trachea and central  bronchi are patent. Upper Abdomen: Cholecystectomy.  No acute upper abdominal findings. Musculoskeletal: Postsurgical versus remote posttraumatic change of lower right ribs. Partial ankylosis at the thoracolumbar junction, chronic. Diffuse degenerative change in the thoracic spine. Review of the MIP images confirms the above findings. IMPRESSION: 1. No acute pulmonary embolus or acute intrathoracic abnormality. 2. Chronic elevation of the right hemidiaphragm with postsurgical or remote posttraumatic change of right ribs and chronic basilar consolidation. Chronic hyperdensity abutting the hemidiaphragm may be secondary to prior talc pleurodesis. Chronic  atelectasis in the dependent right lower lobe and right middle lobe. 3. Coronary artery calcifications. Aortic Atherosclerosis (ICD10-I70.0). Electronically Signed   By: Narda Rutherford M.D.   On: 08/27/2020 00:02   MR LUMBAR SPINE WO CONTRAST  Result Date: 08/26/2020 CLINICAL DATA:  Initial evaluation for acute low back pain, cauda equina syndrome suspected. EXAM: MRI LUMBAR SPINE WITHOUT CONTRAST TECHNIQUE: Multiplanar, multisequence MR imaging of the lumbar spine was performed. No intravenous contrast was administered. COMPARISON:  Previous MRI from 06/21/2020 FINDINGS: Segmentation:  Examination degraded by motion artifact. Standard segmentation. Lowest well-formed disc space labeled the L5-S1 level. Alignment: Mild retrolisthesis of L2 on L3 and L3 on L4, stable. Underlying dextroscoliosis. Appearance is stable from previous. Vertebrae: Vertebral body height maintained without acute or interval fracture. T11 and T12 vertebral bodies are partially ankylosed posteriorly. Bone marrow signal intensity diffusely heterogeneous but within normal limits. No worrisome osseous lesions. Discogenic reactive endplate change present throughout the lumbar spine, most pronounced at L4-5 on the right. Conus medullaris and cauda equina: Conus extends to the L1 level. Conus and cauda equina appear normal. Paraspinal and other soft tissues: Chronic postoperative changes present within the posterior paraspinous soft tissues. Approximate 1 cm simple exophytic cyst noted extending from the interpolar right kidney. Additional 4.6 cm simple cyst present at the lower pole of the right kidney. Extrarenal pelvises noted bilaterally. Visualized visceral structures otherwise unremarkable. Disc levels: T12-L1: Mild disc bulge with disc desiccation. Superimposed small right subarticular disc protrusion indents the right ventral thecal sac (series 8, image 5). Mild facet hypertrophy. No significant spinal stenosis. Foramina remain patent.  L1-2: Degenerative intervertebral disc space narrowing with mild disc bulge. Discogenic reactive endplate change. Mild bilateral facet hypertrophy. No significant spinal stenosis. Foramina remain patent. L2-3: Retrolisthesis. Advanced degenerative intervertebral disc space narrowing with diffuse disc bulge, asymmetric to the left. Associated prominent reactive endplate change with marginal endplate osteophytic spurring. Prior posterior decompression. Residual mild facet hypertrophy. No significant spinal stenosis. Mild to moderate bilateral foraminal narrowing. Appearance is stable. L3-4: Retrolisthesis. Advanced degenerative intervertebral disc space narrowing with diffuse disc bulge. Associated discogenic reactive endplate change with marginal endplate osteophytic spurring, greater on the left. Prior posterior decompression. Residual right greater than left facet hypertrophy. Stable thecal sac patency without significant spinal stenosis. Mild to moderate left greater than right L3 foraminal narrowing, stable. L4-5: Degenerative intervertebral disc space narrowing with diffuse disc bulge and disc desiccation. Associated reactive endplate changes. Superimposed small central to right subarticular disc protrusion with inferior migration (series 3, image 10), stable from previous. Protruding disc closely approximates the right greater than left descending L5 nerve roots, either which could be affected. The right L5 nerve root is slightly displaced posteriorly. Prior posterior decompression. Residual moderate facet hypertrophy. Mild to moderate bilateral lateral recess narrowing without significant spinal stenosis. Severe right with moderate left L4 foraminal narrowing. Appearance is relatively stable. L5-S1: Diffuse disc bulge with disc desiccation and intervertebral disc space narrowing. Associated reactive endplate spurring. Superimposed small  central disc protrusion with slight inferior migration. Mild to moderate  bilateral facet hypertrophy. Mild narrowing of the lateral recesses bilaterally. Central canal remains patent. 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 Appearance is stable. IMPRESSION: 1. No evidence for cord compression or other acute abnormality within the lumbar spine. Relatively stable appearance as compared to most recent MRI from 06/21/2020. Stable severe multilevel degenerative spondylosis with severe foraminal stenosis on the right at L4-5 and bilaterally at L5-S1. 2. More moderate foraminal narrowing bilaterally at L3-4 and on the left at L4-5. 3. Central to right subarticular disc protrusion at L4-5, potentially irritating either of the descending L5 nerve roots, right greater than left. Electronically Signed   By: Rise Mu M.D.   On: 08/26/2020 22:45     Assessment and Plan:   1. Unexplained dyspnea -the etiology is unclear.  His elevated hemidiaphragm could be playing a role although he is quite limited by his symptoms.  I doubt angina.  I would recommend he undergo pulmonary function testing with DLCO.  If his lung function testing was negative, right and left heart catheterization would be a consideration.  He is presently limited too much to undergo cardiopulmonary stress testing.  At this point, he may require additional diuretic therapy but would hold off for now until additional evaluation has occurred.  Review of his chest x-ray demonstrates a significant amount of gas/air in his intestines.  I wonder if mechanically the elevated diaphragm has somehow affected his pulmonary mechanics.  I would think this would be demonstrated with pulmonary function testing. 2. Coronary artery disease -he denies angina.  His prior evaluation has been at the [place].  We will attempt to obtain records. 3. Hypertension -his current blood pressures are well controlled.  No change in medications   New York Heart Association (NYHA) Functional Class NYHA Class III     For questions or  updates, please contact [facility] Please consult [email or web address] for contact info under    Signed, Lewayne Bunting, MD  08/28/2020 3:02 PM

## 2020-08-28 NOTE — Progress Notes (Signed)
PROGRESS NOTE    Raymond White  YQM:[id number]  DOB: [birth year]  DOA: 08/26/2020 PCP: [facility]:   Hospital course:  70 year old man with CAD, HFpEF with EF 60% August 2021, Parkinson's disease, atrial fibrillation and history of CVA was admitted yesterday with worsening DOE.  Was noted to have a 3 L oxygen requirement in the ED.  This was presumed to be secondary to decompensated heart failure and patient was treated with Lasix 80 p.o. twice daily over the past month without improvement in symptoms.  Patient states he is gained 20 pounds.  Patient was admitted for decompensated heart failure however CT angiogram showed no pulmonary edema, no parenchymal disease and no PE.  He does have an elevated hemidiaphragm.  Subjective:  Patient states he continues to feel winded and tired.  Notes that he was winded and tired just walking from the sink to his chair.  Patient is also concerned that he is not putting as much urine out as he had been.  Notes he has required in and out caths in the past is worried that he may be having some urinary retention.  Patient also notes he would like a PCP and a cardiologist in the community.  Notes he is followed by the [facility] hospital however feels he would be better taken care of on the outside.  He has Medicare.   Objective: Vitals:   08/28/20 0519 08/28/20 0726 08/28/20 0940 08/28/20 1152  BP: 103/65 116/67 108/69   Pulse: 68 67 75   Resp: 16 20    Temp: 98 F (36.7 C) 97.8 F (36.6 C)    TempSrc: Oral Oral    SpO2: 99% 97%  95%  Weight:      Height:        Intake/Output Summary (Last 24 hours) at 08/28/2020 1328 Last data filed at 08/28/2020 0950 Gross per 24 hour  Intake 240 ml  Output 1680 ml  Net -1440 ml   Filed Weights   08/26/20 0929 08/27/20 1839 08/27/20 1839  Weight: 111.1 kg 103.7 kg 103.7 kg     Exam:  General: Anxious appearing man sitting up in recliner about to eat lunch in no acute  distress.  Able to speak in full sentences without difficulty. Eyes: sclera anicteric, conjuctiva mild injection bilaterally CVS: S1-S2, regular  Respiratory: Decreased air entry bilaterally however lungs are clear with no rales. GI: NABS, soft, NT  LE: No edema.  Neuro: grossly nonfocal.  Psych: patient is logical and coherent, judgement and insight appear normal, mood and affect appropriate to situation.   Assessment & Plan:   Dyspnea on exertion attributed to decompensated HFpEF Patient was thought to have decompensated HFpEF yesterday and was treated with Lasix 80 IV twice daily with excellent diuresis.  However CT done in ED failed to show any pulmonary edema, PE  or any parenchymal disease.   Given clear lungs today and no pulmonary edema on CT, will hold off on further diuresis. Because of DOE may be secondary to possible anginal equivalent versus poor cardiopulmonary reserve. Will request cardiology (spoke with Dr. Tenny Craw) to evaluate for possible anginal equivalent and optimize cardiac meds although patient does not appear to have any systolic dysfunction. Any acute decompensation of heart failure seems to have been treated. Patient may also benefit from pulmonary function test as an outpatient to assess baseline pulmonary function Will wean patient off of oxygen at present and see if he has any resting oxygen requirement.  CAD Continue aspirin, Plavix, carvedilol and atorvastatin As noted above, patient's DOE may be anginal equivalent Cardiology consult pending  Chronic low back pain Patient is status post laminectomies and fusion in both lower thoracic and upper lumbar spine He continues to have intermittent back pain MRI done yesterday does show severe but stable spinal stenosis This can be followed up as an outpatient. She had been started on ceftriaxone however this was discontinued which I agree with.  Atrial fibrillation Continue carvedilol for rate control Continue  Pradaxa for secondary prevention of stroke given chads vas 2 score of 3  Anxiety/depression Patient is noted to be on both an SSRI and SNRI This can be addressed by his psychiatrist as an outpatient  Parkinson's disease Continue Sinemet   DVT prophylaxis: Pradaxa Code Status: Full Family Communication: None Disposition Plan:   Patient is from: Home  Anticipated Discharge Location: TBD  Barriers to Discharge: Still short of breath  Is patient medically stable for Discharge: No   Consultants:  Cardiology consult requested today  Procedures:  None  Antimicrobials:  Ceftriaxone yesterday, was discontinued   Data Reviewed:  Basic Metabolic Panel: Recent Labs  Lab 08/26/20 0933 08/26/20 1900 08/27/20 0735 08/28/20 0302  NA 136 136 140 139  K 4.9 4.3 3.6 3.5  CL 101  --  99 101  CO2 18*  --  25 27  GLUCOSE 102*  --  95 119*  BUN 12  --  14 18  CREATININE 0.93  --  1.07 0.95  CALCIUM 8.6*  --  8.3* 8.5*  MG  --   --  2.1  --    Liver Function Tests: Recent Labs  Lab 08/27/20 0735 08/28/20 0302  AST 21 18  ALT 18 <5  ALKPHOS 84 75  BILITOT 2.5* 1.6*  PROT 5.8* 5.6*  ALBUMIN 3.2* 3.2*   No results for input(s): LIPASE, AMYLASE in the last 168 hours. No results for input(s): AMMONIA in the last 168 hours. CBC: Recent Labs  Lab 08/26/20 0933 08/26/20 1900 08/27/20 0735 08/28/20 0302  WBC 7.7  --  6.6 5.3  NEUTROABS  --   --  5.1  --   HGB 15.5 13.9 14.6 13.5  HCT 46.9 41.0 41.7 39.6  MCV 101.7*  --  102.7* 101.5*  PLT 143*  --  113* 111*   Cardiac Enzymes: No results for input(s): CKTOTAL, CKMB, CKMBINDEX, TROPONINI in the last 168 hours. BNP (last 3 results) No results for input(s): PROBNP in the last 8760 hours. CBG: No results for input(s): GLUCAP in the last 168 hours.  Recent Results (from the past 240 hour(s))  Resp Panel by RT-PCR (Flu A&B, Covid) Nasopharyngeal Swab     Status: None   Collection Time: 08/26/20  5:25 PM   Specimen:  Nasopharyngeal Swab; Nasopharyngeal(NP) swabs in vial transport medium  Result Value Ref Range Status   SARS Coronavirus 2 by RT PCR NEGATIVE NEGATIVE Final    Comment: (NOTE) SARS-CoV-2 target nucleic acids are NOT DETECTED.  The SARS-CoV-2 RNA is generally detectable in upper respiratory specimens during the acute phase of infection. The lowest concentration of SARS-CoV-2 viral copies this assay can detect is 138 copies/mL. A negative result does not preclude SARS-Cov-2 infection and should not be used as the sole basis for treatment or other patient management decisions. A negative result may occur with  improper specimen collection/handling, submission of specimen other than nasopharyngeal swab, presence of viral mutation(s) within the areas targeted by this assay, and  inadequate number of viral copies(<138 copies/mL). A negative result must be combined with clinical observations, patient history, and epidemiological information. The expected result is Negative.  Fact Sheet for Patients:  BloggerCourse.com  Fact Sheet for Healthcare Providers:  SeriousBroker.it  This test is no t yet approved or cleared by the [place] FDA and  has been authorized for detection and/or diagnosis of SARS-CoV-2 by FDA under an Emergency Use Authorization (EUA). This EUA will remain  in effect (meaning this test can be used) for the duration of the COVID-19 declaration under Section 564(b)(1) of the Act, 21 U.S.C.section 360bbb-3(b)(1), unless the authorization is terminated  or revoked sooner.       Influenza A by PCR NEGATIVE NEGATIVE Final   Influenza B by PCR NEGATIVE NEGATIVE Final    Comment: (NOTE) The Xpert Xpress SARS-CoV-2/FLU/RSV plus assay is intended as an aid in the diagnosis of influenza from Nasopharyngeal swab specimens and should not be used as a sole basis for treatment. Nasal washings and aspirates are unacceptable for  Xpert Xpress SARS-CoV-2/FLU/RSV testing.  Fact Sheet for Patients: BloggerCourse.com  Fact Sheet for Healthcare Providers: SeriousBroker.it  This test is not yet approved or cleared by the [place] FDA and has been authorized for detection and/or diagnosis of SARS-CoV-2 by FDA under an Emergency Use Authorization (EUA). This EUA will remain in effect (meaning this test can be used) for the duration of the COVID-19 declaration under Section 564(b)(1) of the Act, 21 U.S.C. section 360bbb-3(b)(1), unless the authorization is terminated or revoked.  Performed at [facility], [address], [place], [place] [address]   Blood culture (routine x 2)     Status: None (Preliminary result)   Collection Time: 08/26/20 11:29 PM   Specimen: BLOOD LEFT ARM  Result Value Ref Range Status   Specimen Description BLOOD LEFT ARM  Final   Special Requests   Final    BOTTLES DRAWN AEROBIC AND ANAEROBIC Blood Culture results may not be optimal due to an inadequate volume of blood received in culture bottles   Culture   Final    NO GROWTH 1 DAY Performed at [facility], [address], [place], [place] [address]    Report Status PENDING  Incomplete      Studies: CT Angio Chest PE W/Cm &/Or Wo Cm  Result Date: 08/27/2020 CLINICAL DATA:  Chest pain or SOB, pleurisy or effusion suspected Shortness of breath, worse for the past 2 days. EXAM: CT ANGIOGRAPHY CHEST WITH CONTRAST TECHNIQUE: Multidetector CT imaging of the chest was performed using the standard protocol during bolus administration of intravenous contrast. Multiplanar CT image reconstructions and MIPs were obtained to evaluate the vascular anatomy. CONTRAST:  OMNIPAQUE IOHEXOL 350 MG/ML SOLN COMPARISON:  Radiograph earlier today.  Chest CTA 06/24/2019 FINDINGS: Cardiovascular: Motion artifact particularly through the lung bases. Allowing for motion artifact, no evidence of  acute pulmonary embolus. Aortic atherosclerosis and tortuosity. No dissection or acute aortic findings. Common origin of the brachiocephalic and left common carotid artery, variant arch anatomy. Heart is normal in size. There are coronary artery calcifications. No pericardial effusion. Mediastinum/Nodes: Small mediastinal and hilar lymph nodes not enlarged by size criteria. Small hiatal hernia. Upper esophagus is patulous. No suspicious thyroid nodule. Lungs/Pleura: Chronic elevation of the right hemidiaphragm with postsurgical or remote posttraumatic change of right ribs and chronic basilar consolidation, hyperdensity abutting the hemidiaphragm may be secondary to prior talc pleurodesis. Findings are stable from prior exam. Chronic atelectasis in the  dependent right lower lobe and right middle lobe. No evidence of pneumonia or acute airspace disease. No pleural fluid. No findings of pulmonary edema. Trachea and central bronchi are patent. Upper Abdomen: Cholecystectomy.  No acute upper abdominal findings. Musculoskeletal: Postsurgical versus remote posttraumatic change of lower right ribs. Partial ankylosis at the thoracolumbar junction, chronic. Diffuse degenerative change in the thoracic spine. Review of the MIP images confirms the above findings. IMPRESSION: 1. No acute pulmonary embolus or acute intrathoracic abnormality. 2. Chronic elevation of the right hemidiaphragm with postsurgical or remote posttraumatic change of right ribs and chronic basilar consolidation. Chronic hyperdensity abutting the hemidiaphragm may be secondary to prior talc pleurodesis. Chronic atelectasis in the dependent right lower lobe and right middle lobe. 3. Coronary artery calcifications. Aortic Atherosclerosis (ICD10-I70.0). Electronically Signed   By: Narda Rutherford M.D.   On: 08/27/2020 00:02   MR LUMBAR SPINE WO CONTRAST  Result Date: 08/26/2020 CLINICAL DATA:  Initial evaluation for acute low back pain, cauda equina syndrome  suspected. EXAM: MRI LUMBAR SPINE WITHOUT CONTRAST TECHNIQUE: Multiplanar, multisequence MR imaging of the lumbar spine was performed. No intravenous contrast was administered. COMPARISON:  Previous MRI from 06/21/2020 FINDINGS: Segmentation:  Examination degraded by motion artifact. Standard segmentation. Lowest well-formed disc space labeled the L5-S1 level. Alignment: Mild retrolisthesis of L2 on L3 and L3 on L4, stable. Underlying dextroscoliosis. Appearance is stable from previous. Vertebrae: Vertebral body height maintained without acute or interval fracture. T11 and T12 vertebral bodies are partially ankylosed posteriorly. Bone marrow signal intensity diffusely heterogeneous but within normal limits. No worrisome osseous lesions. Discogenic reactive endplate change present throughout the lumbar spine, most pronounced at L4-5 on the right. Conus medullaris and cauda equina: Conus extends to the L1 level. Conus and cauda equina appear normal. Paraspinal and other soft tissues: Chronic postoperative changes present within the posterior paraspinous soft tissues. Approximate 1 cm simple exophytic cyst noted extending from the interpolar right kidney. Additional 4.6 cm simple cyst present at the lower pole of the right kidney. Extrarenal pelvises noted bilaterally. Visualized visceral structures otherwise unremarkable. Disc levels: T12-L1: Mild disc bulge with disc desiccation. Superimposed small right subarticular disc protrusion indents the right ventral thecal sac (series 8, image 5). Mild facet hypertrophy. No significant spinal stenosis. Foramina remain patent. L1-2: Degenerative intervertebral disc space narrowing with mild disc bulge. Discogenic reactive endplate change. Mild bilateral facet hypertrophy. No significant spinal stenosis. Foramina remain patent. L2-3: Retrolisthesis. Advanced degenerative intervertebral disc space narrowing with diffuse disc bulge, asymmetric to the left. Associated prominent  reactive endplate change with marginal endplate osteophytic spurring. Prior posterior decompression. Residual mild facet hypertrophy. No significant spinal stenosis. Mild to moderate bilateral foraminal narrowing. Appearance is stable. L3-4: Retrolisthesis. Advanced degenerative intervertebral disc space narrowing with diffuse disc bulge. Associated discogenic reactive endplate change with marginal endplate osteophytic spurring, greater on the left. Prior posterior decompression. Residual right greater than left facet hypertrophy. Stable thecal sac patency without significant spinal stenosis. Mild to moderate left greater than right L3 foraminal narrowing, stable. L4-5: Degenerative intervertebral disc space narrowing with diffuse disc bulge and disc desiccation. Associated reactive endplate changes. Superimposed small central to right subarticular disc protrusion with inferior migration (series 3, image 10), stable from previous. Protruding disc closely approximates the right greater than left descending L5 nerve roots, either which could be affected. The right L5 nerve root is slightly displaced posteriorly. Prior posterior decompression. Residual moderate facet hypertrophy. Mild to moderate bilateral lateral recess narrowing without significant spinal stenosis. Severe right with  moderate left L4 foraminal narrowing. Appearance is relatively stable. L5-S1: Diffuse disc bulge with disc desiccation and intervertebral disc space narrowing. Associated reactive endplate spurring. Superimposed small central disc protrusion with slight inferior migration. Mild to moderate bilateral facet hypertrophy. Mild narrowing of the lateral recesses bilaterally. Central canal remains patent. 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 Appearance is stable. IMPRESSION: 1. No evidence for cord compression or other acute abnormality within the lumbar spine. Relatively stable appearance as compared to most recent MRI from  06/21/2020. Stable severe multilevel degenerative spondylosis with severe foraminal stenosis on the right at L4-5 and bilaterally at L5-S1. 2. More moderate foraminal narrowing bilaterally at L3-4 and on the left at L4-5. 3. Central to right subarticular disc protrusion at L4-5, potentially irritating either of the descending L5 nerve roots, right greater than left. Electronically Signed   By: Rise MuBenjamin  McClintock M.D.   On: 08/26/2020 22:45     Scheduled Meds: . aspirin  81 mg Oral Daily  . atorvastatin  80 mg Oral QPM  . busPIRone  10 mg Oral TID  . carbidopa-levodopa  2 tablet Oral TID  . carvedilol  6.25 mg Oral BID WC  . cholecalciferol  1,000 Units Oral Daily  . citalopram  10 mg Oral QHS  . clopidogrel  75 mg Oral Daily  . dabigatran  75 mg Oral Daily  . DULoxetine  90 mg Oral Daily  . famotidine  20 mg Oral Daily  . FLUoxetine  20 mg Oral Daily  . pantoprazole  40 mg Oral Daily  . potassium chloride SA  20 mEq Oral Daily  . ramelteon  8 mg Oral QHS  . tamsulosin  0.4 mg Oral QHS  . thiamine  100 mg Oral Daily   Continuous Infusions:  Principal Problem:   Acute on chronic diastolic CHF (congestive heart failure) (HCC) Active Problems:   Movement disorder   Hypertension   Parkinson disease (HCC)   Acute respiratory failure with hypoxemia (HCC)   Acute CHF (congestive heart failure) (HCC)     Pearl Berlinger Tublu Hrithik [place], [facility]  If 7PM-7AM, please contact night-coverage [email or web address] Password [email or web address] 08/28/2020, 1:28 PM    LOS: 1 day
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
## 2020-08-28 NOTE — Progress Notes (Signed)
Pt with poor urine output today. Bladder scan completed with amount of .  Patient scheduled for flomax tonight but MD notified.

## 2020-08-29 ENCOUNTER — Inpatient Hospital Stay (HOSPITAL_COMMUNITY): Payer: Medicare Other

## 2020-08-29 DIAGNOSIS — R0602 Shortness of breath: Secondary | ICD-10-CM

## 2020-08-29 DIAGNOSIS — G20 Parkinson's disease: Secondary | ICD-10-CM

## 2020-08-29 DIAGNOSIS — D696 Thrombocytopenia, unspecified: Secondary | ICD-10-CM

## 2020-08-29 DIAGNOSIS — F419 Anxiety disorder, unspecified: Secondary | ICD-10-CM

## 2020-08-29 DIAGNOSIS — I5033 Acute on chronic diastolic (congestive) heart failure: Secondary | ICD-10-CM | POA: Diagnosis not present

## 2020-08-29 DIAGNOSIS — I350 Nonrheumatic aortic (valve) stenosis: Secondary | ICD-10-CM | POA: Diagnosis not present

## 2020-08-29 DIAGNOSIS — R945 Abnormal results of liver function studies: Secondary | ICD-10-CM

## 2020-08-29 DIAGNOSIS — R0609 Other forms of dyspnea: Secondary | ICD-10-CM

## 2020-08-29 DIAGNOSIS — G8929 Other chronic pain: Secondary | ICD-10-CM

## 2020-08-29 DIAGNOSIS — M5459 Other low back pain: Secondary | ICD-10-CM

## 2020-08-29 DIAGNOSIS — F32A Depression, unspecified: Secondary | ICD-10-CM

## 2020-08-29 LAB — BASIC METABOLIC PANEL
Anion gap: 9 (ref 5–15)
BUN: 17 mg/dL (ref 8–23)
CO2: 29 mmol/L (ref 22–32)
Calcium: 8.5 mg/dL — ABNORMAL LOW (ref 8.9–10.3)
Chloride: 102 mmol/L (ref 98–111)
Creatinine, Ser: 0.84 mg/dL (ref 0.61–1.24)
GFR, Estimated: >60 mL/min (ref >60)
Glucose, Bld: 120 mg/dL — ABNORMAL HIGH (ref 70–99)
Potassium: 3.3 mmol/L — ABNORMAL LOW (ref 3.5–5.1)
Sodium: 140 mmol/L (ref 135–145)

## 2020-08-29 LAB — CBC
HCT: 39.6 % (ref 39.0–52.0)
Hemoglobin: 13 g/dL (ref 13.0–17.0)
MCH: 34 pg (ref 26.0–34.0)
MCHC: 32.8 g/dL (ref 30.0–36.0)
MCV: 103.7 fL — ABNORMAL HIGH (ref 80.0–100.0)
Platelets: 110 10*3/uL — ABNORMAL LOW (ref 150–400)
RBC: 3.82 MIL/uL — ABNORMAL LOW (ref 4.22–5.81)
RDW: 13 % (ref 11.5–15.5)
WBC: 5.3 10*3/uL (ref 4.0–10.5)
nRBC: 0 % (ref 0.0–0.2)

## 2020-08-29 LAB — ECHOCARDIOGRAM COMPLETE
Area-P 1/2: 4.21 cm2
Height: 73 in
S' Lateral: 3.5 cm
Weight: 3643.2 oz

## 2020-08-29 LAB — PULMONARY FUNCTION TEST
DL/VA % pred: 96 %
DL/VA: 3.87 ml/min/mmHg/L
DLCO cor % pred: 47 %
DLCO cor: 13.62 ml/min/mmHg
DLCO unc % pred: 45 %
DLCO unc: 12.97 ml/min/mmHg
FEF 25-75 Pre: 1.05 L/sec
FEF2575-%Pred-Pre: 37 %
FEV1-%Pred-Pre: 40 %
FEV1-Pre: 1.51 L
FEV1FVC-%Pred-Pre: 102 %
FEV6-%Pred-Pre: 41 %
FEV6-Pre: 1.98 L
FEV6FVC-%Pred-Pre: 104 %
FVC-%Pred-Pre: 39 %
FVC-Pre: 1.99 L
Pre FEV1/FVC ratio: 76 %
Pre FEV6/FVC Ratio: 99 %
RV % pred: 410 %
RV: 10.64 L
TLC % pred: 166 %
TLC: 12.71 L

## 2020-08-29 MED ORDER — FLUTICASONE FUROATE-VILANTEROL 200-25 MCG/INH IN AEPB
1.0000 | INHALATION_SPRAY | Freq: Every day | RESPIRATORY_TRACT | Status: DC
  Administered 2020-08-29 – 2020-08-31 (×3): 1 via RESPIRATORY_TRACT
  Filled 2020-08-29: qty 28

## 2020-08-29 MED ORDER — POTASSIUM CHLORIDE CRYS ER 20 MEQ PO TBCR
40.0000 meq | EXTENDED_RELEASE_TABLET | Freq: Two times a day (BID) | ORAL | Status: DC
  Administered 2020-08-29 – 2020-08-31 (×3): 40 meq via ORAL
  Filled 2020-08-29 (×3): qty 2

## 2020-08-29 MED ORDER — CALCIUM CARBONATE-VITAMIN D 500-200 MG-UNIT PO TABS
1.0000 | ORAL_TABLET | Freq: Two times a day (BID) | ORAL | Status: DC
  Administered 2020-08-29 – 2020-08-31 (×5): 1 via ORAL
  Filled 2020-08-29 (×5): qty 1

## 2020-08-29 MED ORDER — ENOXAPARIN SODIUM 60 MG/0.6ML ~~LOC~~ SOLN
50.0000 mg | SUBCUTANEOUS | Status: DC
  Administered 2020-08-29 – 2020-08-30 (×2): 50 mg via SUBCUTANEOUS
  Filled 2020-08-29 (×2): qty 0.6

## 2020-08-29 NOTE — Progress Notes (Signed)
Patient ID: Raymond White, male   DOB: August 09, 1951, 70 y.o.   MRN: [id number]  PROGRESS NOTE    OLANDER FRIEDL  LFY:[id number] DOB: May 19, 1951 DOA: 08/26/2020 PCP: Judeth Horn, PA-C    Brief Narrative:  70 year old man with CAD, HFpEF with EF 60% August 2021, Parkinson's disease, atrial fibrillation and history of CVA was admitted yesterday with worsening DOE.  Was noted to have a 3 L oxygen requirement in the ED.  This was presumed to be secondary to decompensated heart failure and patient was treated with Lasix 80 p.o. twice daily over the past month without improvement in symptoms.  Patient states he is gained 20 pounds.  Patient was admitted for decompensated heart failure however CT angiogram showed no pulmonary edema, no parenchymal disease and no PE.  He does have an elevated hemidiaphragm.   Assessment & Plan:   Principal Problem:   Acute on chronic diastolic CHF (congestive heart failure) (HCC) Active Problems:   Movement disorder   Hypertension   Parkinson disease (HCC)   Acute respiratory failure with hypoxemia (HCC)   Acute CHF (congestive heart failure) (HCC)  Dyspnea on exertion attributed to decompensated HFpEF Patient was thought to have decompensated HFpEF yesterday and was treated with Lasix 80 IV twice daily with excellent diuresis.  However CT done in ED failed to show any pulmonary edema, PE  or any parenchymal disease.  Repeat echo reveals an EF of 55 to 60%, grade 1 diastolic dysfunction, no valvular disease. Any acute decompensation of heart failure seems to have been treated. Patient may also benefit from pulmonary function test as an outpatient to assess baseline pulmonary function   CAD Continue aspirin, Plavix, carvedilol and atorvastatin As noted above, patient's DOE may be anginal equivalent Cardiology consult pending  Chronic low back pain Patient is status post laminectomies and fusion in both lower thoracic and upper lumbar spine He  continues to have intermittent back pain MRI done on admission does show severe but stable spinal stenosis compared with MRI done 11 of 2021 This can be followed up as an outpatient. He reports difficulty with urination and fecal incontinence.  There is nothing in the MR to suggest an underlying reason for this.  We will add bulking agent to stool.  History of paroxysmal atrial fibrillation Remote history, on Pradaxa before.,  Currently on aspirin and Plavix.  He has been on this normal sinus rhythm and therefore, per cardiology, no need to continue Pradaxa.  Anxiety/depression Patient is noted to be on both an SSRI and SNRI This can be addressed by his psychiatrist as an outpatient  Parkinson's disease Continue Sinemet  Abnormal LFTs / thrombocytopenia Elevated bilirubin, decreased albumin, decreased platelet count noted Consider cirrhotic (negative CT for cirrhosis, only steatosis in 2019) eval given ETOH and abnormal liver function Patient reports limited alcohol intake only drinking 1 glass of wine or 2 a week.  A caregiver did call in this morning to report that he had a lot of wine and vodka and noted in his home.  DVT prophylaxis: Lovenox SQ Code Status: Full code  Family Communication: Patient bedside Disposition Plan: SNF per PT recommendation  Patient remains inpatient due to unsafe discharge plan.   Consultants:   Cardiology  Procedures:  None  Antimicrobials: Anti-infectives (From admission, onward)   Start     Dose/Rate Route Frequency Ordered Stop   08/27/20 2200  cefTRIAXone (ROCEPHIN) 1 g in sodium chloride 0.9 % 100 mL IVPB  Status:  Discontinued  1 g 200 mL/hr over 30 Minutes Intravenous Every 24 hours 08/27/20 0517 08/27/20 1659   08/26/20 2215  cefTRIAXone (ROCEPHIN) 1 g in sodium chloride 0.9 % 100 mL IVPB        1 g 200 mL/hr over 30 Minutes Intravenous  Once 08/26/20 2200 08/27/20 0053       Subjective: Patient reports fecal  incontinence, and urinary retention.  He has ongoing low back pain and is status post several laminectomies.  He is down approximately 14 pounds since admission and reports his breathing is better.  He remains quite short of breath with any exertion.   Objective: Vitals:   08/28/20 1717 08/28/20 2203 08/29/20 0400 08/29/20 0822  BP: 105/68 105/73 117/76 (!) 102/59  Pulse:  71 60 65  Resp: 18 16 20    Temp:  97.7 F (36.5 C) 97.7 F (36.5 C)   TempSrc:  Oral Oral   SpO2: 96% 98% 93% 97%  Weight:   103.3 kg   Height:        Intake/Output Summary (Last 24 hours) at 08/29/2020 1505 Last data filed at 08/29/2020 1300 Gross per 24 hour  Intake 822 ml  Output 300 ml  Net 522 ml   Filed Weights   08/27/20 1839 08/27/20 1839 08/29/20 0400  Weight: 103.7 kg 103.7 kg 103.3 kg    Examination:  General exam: Appears calm and comfortable  Respiratory system: Clear to auscultation. Respiratory effort normal. Cardiovascular system: S1 & S2 heard, RRR.  Gastrointestinal system: Abdomen is nondistended, soft and nontender.  Central nervous system: Alert and oriented. No focal neurological deficits. Extremities: Symmetric  Skin: No rashes Psychiatry: Judgement and insight appear normal. Mood & affect appropriate.     Data Reviewed: I have personally reviewed following labs and imaging studies  CBC: Recent Labs  Lab 08/26/20 0933 08/26/20 1900 08/27/20 0735 08/28/20 0302 08/29/20 0329  WBC 7.7  --  6.6 5.3 5.3  NEUTROABS  --   --  5.1  --   --   HGB 15.5 13.9 14.6 13.5 13.0  HCT 46.9 41.0 41.7 39.6 39.6  MCV 101.7*  --  102.7* 101.5* 103.7*  PLT 143*  --  113* 111* 110*   Basic Metabolic Panel: Recent Labs  Lab 08/26/20 0933 08/26/20 1900 08/27/20 0735 08/28/20 0302 08/29/20 0329  NA 136 136 140 139 140  K 4.9 4.3 3.6 3.5 3.3*  CL 101  --  99 101 102  CO2 18*  --  25 27 29   GLUCOSE 102*  --  95 119* 120*  BUN 12  --  14 18 17   CREATININE 0.93  --  1.07 0.95 0.84   CALCIUM 8.6*  --  8.3* 8.5* 8.5*  MG  --   --  2.1  --   --    GFR: Estimated Creatinine Clearance: 104.8 mL/min (by C-G formula based on SCr of 0.84 mg/dL). Liver Function Tests: Recent Labs  Lab 08/27/20 0735 08/28/20 0302  AST 21 18  ALT 18 <5  ALKPHOS 84 75  BILITOT 2.5* 1.6*  PROT 5.8* 5.6*  ALBUMIN 3.2* 3.2*   Thyroid Function Tests: Recent Labs    08/27/20 0736  TSH 1.089   Anemia Panel: No results for input(s): VITAMINB12, FOLATE, FERRITIN, TIBC, IRON, RETICCTPCT in the last 72 hours. Sepsis Labs: Recent Labs  Lab 08/26/20 2329  LATICACIDVEN 1.2    Recent Results (from the past 240 hour(s))  Resp Panel by RT-PCR (Flu A&B, Covid) Nasopharyngeal Swab  Status: None   Collection Time: 08/26/20  5:25 PM   Specimen: Nasopharyngeal Swab; Nasopharyngeal(NP) swabs in vial transport medium  Result Value Ref Range Status   SARS Coronavirus 2 by RT PCR NEGATIVE NEGATIVE Final    Comment: (NOTE) SARS-CoV-2 target nucleic acids are NOT DETECTED.  The SARS-CoV-2 RNA is generally detectable in upper respiratory specimens during the acute phase of infection. The lowest concentration of SARS-CoV-2 viral copies this assay can detect is 138 copies/mL. A negative result does not preclude SARS-Cov-2 infection and should not be used as the sole basis for treatment or other patient management decisions. A negative result may occur with  improper specimen collection/handling, submission of specimen other than nasopharyngeal swab, presence of viral mutation(s) within the areas targeted by this assay, and inadequate number of viral copies(<138 copies/mL). A negative result must be combined with clinical observations, patient history, and epidemiological information. The expected result is Negative.  Fact Sheet for Patients:  BloggerCourse.comhttps://www.fda.gov/media/152166/download  Fact Sheet for Healthcare Providers:  SeriousBroker.ithttps://www.fda.gov/media/152162/download  This test is no t yet  approved or cleared by the [place] FDA and  has been authorized for detection and/or diagnosis of SARS-CoV-2 by FDA under an Emergency Use Authorization (EUA). This EUA will remain  in effect (meaning this test can be used) for the duration of the COVID-19 declaration under Section 564(b)(1) of the Act, 21 U.S.C.section 360bbb-3(b)(1), unless the authorization is terminated  or revoked sooner.       Influenza A by PCR NEGATIVE NEGATIVE Final   Influenza B by PCR NEGATIVE NEGATIVE Final    Comment: (NOTE) The Xpert Xpress SARS-CoV-2/FLU/RSV plus assay is intended as an aid in the diagnosis of influenza from Nasopharyngeal swab specimens and should not be used as a sole basis for treatment. Nasal washings and aspirates are unacceptable for Xpert Xpress SARS-CoV-2/FLU/RSV testing.  Fact Sheet for Patients: BloggerCourse.comhttps://www.fda.gov/media/152166/download  Fact Sheet for Healthcare Providers: SeriousBroker.ithttps://www.fda.gov/media/152162/download  This test is not yet approved or cleared by the [place] FDA and has been authorized for detection and/or diagnosis of SARS-CoV-2 by FDA under an Emergency Use Authorization (EUA). This EUA will remain in effect (meaning this test can be used) for the duration of the COVID-19 declaration under Section 564(b)(1) of the Act, 21 U.S.C. section 360bbb-3(b)(1), unless the authorization is terminated or revoked.  Performed at [facility], [address] [place], [place] [address]   Urine culture     Status: Abnormal (Preliminary result)   Collection Time: 08/26/20  9:16 PM   Specimen: Urine, Clean Catch  Result Value Ref Range Status   Specimen Description URINE, CLEAN CATCH  Final   Special Requests ADDED 2116 08/27/2020  Final   Culture (A)  Final    >=100,000 COLONIES/mL ENTEROCOCCUS FAECALIS SUSCEPTIBILITIES TO FOLLOW Performed at [facility], [address] [place], [place] [address]    Report Status PENDING  Incomplete   Blood culture (routine x 2)     Status: None (Preliminary result)   Collection Time: 08/26/20 11:29 PM   Specimen: BLOOD LEFT ARM  Result Value Ref Range Status   Specimen Description BLOOD LEFT ARM  Final   Special Requests   Final    BOTTLES DRAWN AEROBIC AND ANAEROBIC Blood Culture results may not be optimal due to an inadequate volume of blood received in culture bottles   Culture   Final    NO GROWTH 2 DAYS Performed at [facility], [address] [place], [place] [address]  Report Status PENDING  Incomplete      Radiology Studies: DG Chest Port 1 View  Result Date: 08/28/2020 CLINICAL DATA:  Hypoxia EXAM: PORTABLE CHEST 1 VIEW COMPARISON:  August 26, 2020 FINDINGS: The cardiomediastinal silhouette is stable. No pneumothorax. Right basilar atelectasis, unchanged. Elevation right hemidiaphragm identified. Mild opacity in the left base is new in the interval, favored to represent atelectasis. Air under the right hemidiaphragm is noted to be interposed colon seen on previous CT imaging. No other acute abnormalities. IMPRESSION: 1. New mild opacity in the left base, favored to represent atelectasis. 2. Stable right basilar atelectasis and elevation of the right hemidiaphragm. Electronically Signed   By: Gerome Sam III M.D   On: 08/28/2020 19:17   ECHOCARDIOGRAM COMPLETE  Result Date: 08/29/2020    ECHOCARDIOGRAM REPORT   Patient Name:   MAK BONNY Date of Exam: 08/29/2020 Medical Rec #:  [id number]         Height:       73.0 in Accession #:    [id number]        Weight:       227.7 lb Date of Birth:  [birth year]         BSA:          2.274 m Patient Age:    69 years          BP:           102/59 mmHg Patient Gender: M                 HR:           64 bpm. Exam Location:  Inpatient Procedure: 2D Echo Indications:    dyspnea.  History:        Patient has prior history of Echocardiogram examinations, most                 recent 08/29/2020. CHF, Parkinson's; Risk  Factors:Hypertension.  Sonographer:    Delcie Roch Referring Phys: 94 DAYNA N DUNN  Sonographer Comments: Suboptimal subcostal window. IMPRESSIONS  1. Left ventricular ejection fraction, by estimation, is 55 to 60%. The left ventricle has normal function. The left ventricle has no regional wall motion abnormalities. Left ventricular diastolic parameters are consistent with Grade I diastolic dysfunction (impaired relaxation).  2. Right ventricular systolic function is normal. The right ventricular size is normal. There is normal pulmonary artery systolic pressure.  3. The mitral valve is normal in structure. Trivial mitral valve regurgitation. No evidence of mitral stenosis.  4. The aortic valve is normal in structure. Aortic valve regurgitation is mild. No aortic stenosis is present.  5. Aortic dilatation noted. There is mild dilatation of the ascending aorta, measuring 39 mm. There is mild dilatation at the level of the sinuses of Valsalva, measuring 38 mm.  6. The inferior vena cava is normal in size with greater than 50% respiratory variability, suggesting right atrial pressure of 3 mmHg. FINDINGS  Left Ventricle: Left ventricular ejection fraction, by estimation, is 55 to 60%. The left ventricle has normal function. The left ventricle has no regional wall motion abnormalities. The left ventricular internal cavity size was normal in size. There is  no left ventricular hypertrophy. Left ventricular diastolic parameters are consistent with Grade I diastolic dysfunction (impaired relaxation). Normal left ventricular filling pressure. Right Ventricle: The right ventricular size is normal. No increase in right ventricular wall thickness. Right ventricular systolic function is normal. There is normal pulmonary artery systolic pressure. The tricuspid regurgitant  velocity is 2.74 m/s, and  with an assumed right atrial pressure of 3 mmHg, the estimated right ventricular systolic pressure is 33.0 mmHg. Left Atrium:  Left atrial size was normal in size. Right Atrium: Right atrial size was normal in size. Pericardium: There is no evidence of pericardial effusion. Mitral Valve: The mitral valve is normal in structure. Mild mitral annular calcification. Trivial mitral valve regurgitation. No evidence of mitral valve stenosis. Tricuspid Valve: The tricuspid valve is normal in structure. Tricuspid valve regurgitation is trivial. No evidence of tricuspid stenosis. Aortic Valve: The aortic valve is normal in structure. Aortic valve regurgitation is mild. No aortic stenosis is present. Pulmonic Valve: The pulmonic valve was normal in structure. Pulmonic valve regurgitation is trivial. No evidence of pulmonic stenosis. Aorta: Aortic dilatation noted. There is mild dilatation of the ascending aorta, measuring 39 mm. There is mild dilatation at the level of the sinuses of Valsalva, measuring 38 mm. Venous: The inferior vena cava is normal in size with greater than 50% respiratory variability, suggesting right atrial pressure of 3 mmHg. IAS/Shunts: No atrial level shunt detected by color flow Doppler.  LEFT VENTRICLE PLAX 2D LVIDd:         5.20 cm  Diastology LVIDs:         3.50 cm  LV e' medial:    7.07 cm/s LV PW:         1.00 cm  LV E/e' medial:  7.8 LV IVS:        1.10 cm  LV e' lateral:   10.20 cm/s LVOT diam:     2.20 cm  LV E/e' lateral: 5.4 LV SV:         65 LV SV Index:   29 LVOT Area:     3.80 cm  RIGHT VENTRICLE RV S prime:     12.20 cm/s TAPSE (M-mode): 1.9 cm LEFT ATRIUM             Index LA diam:        4.40 cm 1.94 cm/m LA Vol (A2C):   67.0 ml 29.47 ml/m LA Vol (A4C):   73.2 ml 32.20 ml/m LA Biplane Vol: 74.8 ml 32.90 ml/m  AORTIC VALVE LVOT Vmax:   78.30 cm/s LVOT Vmean:  52.300 cm/s LVOT VTI:    0.171 m  AORTA Ao Root diam: 3.80 cm Ao Asc diam:  3.90 cm MITRAL VALVE               TRICUSPID VALVE MV Area (PHT): 4.21 cm    TR Peak grad:   30.0 mmHg MV Decel Time: 180 msec    TR Vmax:        274.00 cm/s MV E velocity:  54.80 cm/s MV A velocity: 66.40 cm/s  SHUNTS MV E/A ratio:  0.83        Systemic VTI:  0.17 m                            Systemic Diam: 2.20 cm Armanda Magic MD Electronically signed by Armanda Magic MD Signature Date/Time: 08/29/2020/1:08:28 PM    Final      Scheduled Meds: . aspirin  81 mg Oral Daily  . atorvastatin  80 mg Oral QPM  . busPIRone  10 mg Oral TID  . calcium-vitamin D  1 tablet Oral BID  . carbidopa-levodopa  2 tablet Oral TID  . carvedilol  6.25 mg Oral BID WC  . cholecalciferol  1,000  Units Oral Daily  . clopidogrel  75 mg Oral Daily  . DULoxetine  90 mg Oral Daily  . enoxaparin (LOVENOX) injection  50 mg Subcutaneous Q24H  . famotidine  20 mg Oral Daily  . fluticasone furoate-vilanterol  1 puff Inhalation Daily  . pantoprazole  40 mg Oral Daily  . potassium chloride SA  40 mEq Oral BID  . ramelteon  8 mg Oral QHS  . tamsulosin  0.4 mg Oral QHS  . thiamine  100 mg Oral Daily   Continuous Infusions:   LOS: 2 days    Reva Boresanya S Jaleya Pebley, MD 08/29/2020 3:05 PM [phone] [facility] If 7PM-7AM, please contact night-coverage 08/29/2020, 3:05 PM

## 2020-08-29 NOTE — Progress Notes (Signed)
  Echocardiogram 2D Echocardiogram has been performed.  Delcie Roch 08/29/2020, 12:58 PM

## 2020-08-29 NOTE — TOC Initial Note (Signed)
Transition of Care [facility]) - Initial/Assessment Note    Patient Details  Name: Raymond White MRN: [id number] Date of Birth: 1951/06/24  [facility] of [facility]) CM/SW Contact:    Pollie Friar, RN Phone Number: 08/29/2020, 1:43 PM  Clinical Narrative:                 Recommendations are for SNF rehab. CM met with the patient and he is agreeable. He is interested in a facility in the [facility] area preferably in [place]. CM has faxed him out and will provide bed offers as they become available.  Pt is vaccinated against covid.  TOC following.  Expected Discharge Plan: Skilled Nursing Facility Barriers to Discharge: Continued Medical Work up   Patient Goals and CMS Choice   CMS Medicare.gov Compare Post Acute Care list provided to:: Patient Choice offered to / list presented to : Patient  Expected Discharge Plan and Services Expected Discharge Plan: [place] In-house Referral: Clinical Social Work Discharge Planning Services: CM Consult Post Acute Care Choice: [place] Living arrangements for the past 2 months: Apartment                                      Prior Living Arrangements/Services Living arrangements for the past 2 months: Apartment Lives with:: Self Patient language and need for interpreter reviewed:: Yes Do you feel safe going back to the place where you live?: Yes      Need for Family Participation in Patient Care: Yes (Comment) Care giver support system in place?: No (comment)   Criminal Activity/Legal Involvement Pertinent to Current Situation/Hospitalization: No - Comment as needed  Activities of Daily Living Home Assistive Devices/Equipment: Walker (specify type),Cane (specify quad or straight) ADL Screening (condition at time of admission) Patient's cognitive ability adequate to safely complete daily activities?: Yes Is the patient deaf or have difficulty hearing?: No Does the patient have  difficulty seeing, even when wearing glasses/contacts?: No Does the patient have difficulty concentrating, remembering, or making decisions?: No Patient able to express need for assistance with ADLs?: Yes Does the patient have difficulty dressing or bathing?: No Independently performs ADLs?: Yes (appropriate for developmental age) Does the patient have difficulty walking or climbing stairs?: No Weakness of Legs: None Weakness of Arms/Hands: None  Permission Sought/Granted                  Emotional Assessment Appearance:: Appears stated age Attitude/Demeanor/Rapport: Engaged Affect (typically observed): Accepting Orientation: : Oriented to Self,Oriented to Place,Oriented to  Time,Oriented to Situation   Psych Involvement: No (comment)  Admission diagnosis:  SOB (shortness of breath) [R06.02] Hypoxia [R09.02] Acute CHF (congestive heart failure) ([place]) [I50.9] Acute respiratory failure with hypoxemia ([place]) [J96.01] Patient Active Problem List   Diagnosis Date Noted  . Acute respiratory failure with hypoxemia ([place]) 08/27/2020  . Acute on chronic diastolic CHF (congestive heart failure) ([place]) 08/27/2020  . Acute CHF (congestive heart failure) ([place]) 08/27/2020  . Acute congestive heart failure ([place]) 03/29/2020  . Acute right-sided weakness 03/28/2020  . Dyspnea on exertion 03/28/2020  . Respiratory failure with hypoxia ([place]) 10/22/2018  . Acute respiratory failure with hypoxia ([place]) 10/21/2018  . Influenza A 10/21/2018  . Parkinson disease ([place]) 10/21/2018  . DDD (degenerative disc disease), lumbar 12/15/2017  . Klebsiella cystitis 12/15/2017  . 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  . Chronic heart failure with preserved ejection fraction ([place]) 12/15/2017  .  GERD (gastroesophageal reflux disease) 12/15/2017  . Hypertension 12/15/2017  . Depressive disorder 12/15/2017  . Anxiety 12/15/2017   PCP:  Newton Pigg, PA-C Pharmacy:   [place],  [place] [phone] [place] [address] Phone: [phone] Fax: [phone]     Social Determinants of Health (SDOH) Interventions    Readmission Risk Interventions No flowsheet data found.

## 2020-08-29 NOTE — Plan of Care (Signed)
  Problem: Clinical Measurements: Goal: Respiratory complications will improve Outcome: Progressing   Problem: Activity: Goal: Risk for activity intolerance will decrease Outcome: Progressing   Problem: Elimination: Goal: Will not experience complications related to urinary retention Outcome: Progressing   Problem: Safety: Goal: Ability to remain free from injury will improve Outcome: Progressing   
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
## 2020-08-29 NOTE — NC FL2 (Signed)
[place] MEDICAID FL2 LEVEL OF CARE SCREENING TOOL     IDENTIFICATION  Patient Name: Raymond White Birthdate: 13-Sep-1950 Sex: male Admission Date (Current Location): 08/26/2020  [facility] and [place] Number:  Producer, television/film/video and Address:  The . [facility], [address], [place], [place] [address]      Provider Number: [id number]  Attending Physician Name and Address:  Reva Bores, MD  Relative Name and Phone Number:       Current Level of Care: Hospital Recommended Level of Care: Skilled Nursing Facility Prior Approval Number:    Date Approved/Denied:   PASRR Number:    Discharge Plan: SNF    Current Diagnoses: Patient Active Problem List   Diagnosis Date Noted  . Acute respiratory failure with hypoxemia (HCC) 08/27/2020  . Acute on chronic diastolic CHF (congestive heart failure) (HCC) 08/27/2020  . Acute CHF (congestive heart failure) (HCC) 08/27/2020  . Acute congestive heart failure (HCC) 03/29/2020  . Acute right-sided weakness 03/28/2020  . Dyspnea on exertion 03/28/2020  . Respiratory failure with hypoxia (HCC) 10/22/2018  . Acute respiratory failure with hypoxia (HCC) 10/21/2018  . Influenza A 10/21/2018  . Parkinson disease (HCC) 10/21/2018  . DDD (degenerative disc disease), lumbar 12/15/2017  . Klebsiella cystitis 12/15/2017  . 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  . Chronic heart failure with preserved ejection fraction (HCC) 12/15/2017  . GERD (gastroesophageal reflux disease) 12/15/2017  . Hypertension 12/15/2017  . Depressive disorder 12/15/2017  . Anxiety 12/15/2017    Orientation RESPIRATION BLADDER Height & Weight     Self,Time,Situation,Place  O2 (see d/c summary for oxygen requirements) Continent Weight: 103.3 kg Height:  6\' 1"  (185.4 cm)  BEHAVIORAL SYMPTOMS/MOOD NEUROLOGICAL BOWEL NUTRITION STATUS      Continent Diet (heart healthy with 1200 CC FR)  AMBULATORY STATUS COMMUNICATION OF NEEDS Skin   Limited Assist  Verbally Normal                       Personal Care Assistance Level of Assistance  Bathing,Feeding,Dressing Bathing Assistance: Limited assistance Feeding assistance: Independent Dressing Assistance: Limited assistance     Functional Limitations Info  Sight,Hearing,Speech Sight Info: Impaired Hearing Info: Adequate Speech Info: Adequate    SPECIAL CARE FACTORS FREQUENCY  PT (By licensed PT),OT (By licensed OT)     PT Frequency: 5x/wk OT Frequency: 5x/wk            Contractures Contractures Info: Not present    Additional Factors Info  Code Status,Allergies,Psychotropic Code Status Info: Full Allergies Info: Fish-derived products/ zoloft/ Sulfamethizole Psychotropic Info: Buspar 10 mg TID/ Sinemet IR 25-100 2 tabs TID/ Celexa 10 mg at bedtime/ Cymbalta Dr 90 mg Daily/ Prozac 20 mg daily/ Rozerem 8mg  at bedtime         Current Medications (08/29/2020):  This is the current hospital active medication list Current Facility-Administered Medications  Medication Dose Route Frequency Provider Last Rate Last Admin  . acetaminophen (TYLENOL) tablet 650 mg  650 mg Oral Q6H PRN , MD       Or  . acetaminophen (TYLENOL) suppository 650 mg  650 mg Rectal Q6H PRN 10/27/2020, MD      . albuterol (VENTOLIN HFA) 108 (90 Base) MCG/ACT inhaler 2 puff  2 puff Inhalation Q6H PRN Eduard Clos, MD      . aspirin chewable tablet 81 mg  81 mg Oral Daily Eduard Clos, MD   81 mg at 08/29/20 Eduard Clos  .  atorvastatin (LIPITOR) tablet 80 mg  80 mg Oral QPM Eduard Clos, MD   80 mg at 08/28/20 1717  . busPIRone (BUSPAR) tablet 10 mg  10 mg Oral TID Eduard Clos, MD   10 mg at 08/29/20 9201  . carbidopa-levodopa (SINEMET IR) 25-100 MG per tablet immediate release 2 tablet  2 tablet Oral TID Eduard Clos, MD   2 tablet at 08/29/20 0071  . carvedilol (COREG) tablet 6.25 mg  6.25 mg Oral BID WC Eduard Clos, MD   6.25 mg at  08/29/20 2197  . cholecalciferol (VITAMIN D3) tablet 1,000 Units  1,000 Units Oral Daily Eduard Clos, MD   1,000 Units at 08/29/20 5883  . citalopram (CELEXA) tablet 10 mg  10 mg Oral QHS Eduard Clos, MD   10 mg at 08/28/20 2208  . clopidogrel (PLAVIX) tablet 75 mg  75 mg Oral Daily Eduard Clos, MD   75 mg at 08/29/20 2549  . docusate sodium (COLACE) capsule 100 mg  100 mg Oral PRN Eduard Clos, MD      . DULoxetine (CYMBALTA) DR capsule 90 mg  90 mg Oral Daily Eduard Clos, MD   90 mg at 08/29/20 8264  . famotidine (PEPCID) tablet 20 mg  20 mg Oral Daily Eduard Clos, MD   20 mg at 08/29/20 1583  . FLUoxetine (PROZAC) capsule 20 mg  20 mg Oral Daily Eduard Clos, MD   20 mg at 08/29/20 0940  . ondansetron (ZOFRAN-ODT) disintegrating tablet 4 mg  4 mg Oral Q8H PRN Westley Chandler, MD   4 mg at 08/27/20 1251  . pantoprazole (PROTONIX) EC tablet 40 mg  40 mg Oral Daily Eduard Clos, MD   40 mg at 08/29/20 7680  . potassium chloride SA (KLOR-CON) CR tablet 20 mEq  20 mEq Oral Daily Eduard Clos, MD   20 mEq at 08/29/20 8811  . ramelteon (ROZEREM) tablet 8 mg  8 mg Oral QHS Westley Chandler, MD   8 mg at 08/28/20 2208  . tamsulosin (FLOMAX) capsule 0.4 mg  0.4 mg Oral QHS Eduard Clos, MD   0.4 mg at 08/28/20 2208  . thiamine tablet 100 mg  100 mg Oral Daily Eduard Clos, MD   100 mg at 08/29/20 0315     Discharge Medications: Please see discharge summary for a list of discharge medications.  Relevant Imaging Results:  Relevant Lab Results:   Additional Information SS#: [id number]  Kermit Balo, RN
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
## 2020-08-30 ENCOUNTER — Inpatient Hospital Stay (HOSPITAL_COMMUNITY): Payer: Medicare Other

## 2020-08-30 DIAGNOSIS — I5033 Acute on chronic diastolic (congestive) heart failure: Secondary | ICD-10-CM | POA: Diagnosis not present

## 2020-08-30 DIAGNOSIS — I5032 Chronic diastolic (congestive) heart failure: Secondary | ICD-10-CM

## 2020-08-30 DIAGNOSIS — R0602 Shortness of breath: Secondary | ICD-10-CM

## 2020-08-30 LAB — URINE CULTURE: Culture: >=100000 — AB

## 2020-08-30 LAB — SARS CORONAVIRUS 2 (TAT 6-24 HRS): SARS Coronavirus 2: NEGATIVE

## 2020-08-30 MED ORDER — CHOLESTYRAMINE 4 G PO PACK
4.0000 g | PACK | Freq: Three times a day (TID) | ORAL | Status: DC
  Administered 2020-08-30 – 2020-08-31 (×4): 4 g via ORAL
  Filled 2020-08-30 (×6): qty 1

## 2020-08-30 MED ORDER — DIAZEPAM 5 MG PO TABS
5.0000 mg | ORAL_TABLET | Freq: Once | ORAL | Status: AC
  Administered 2020-08-30: 5 mg via ORAL
  Filled 2020-08-30: qty 1

## 2020-08-30 MED ORDER — DIPHENOXYLATE-ATROPINE 2.5-0.025 MG PO TABS
1.0000 | ORAL_TABLET | Freq: Three times a day (TID) | ORAL | Status: DC
  Administered 2020-08-30 – 2020-08-31 (×4): 1 via ORAL
  Filled 2020-08-30 (×4): qty 1

## 2020-08-30 MED ORDER — AMOXICILLIN 500 MG PO CAPS
500.0000 mg | ORAL_CAPSULE | Freq: Three times a day (TID) | ORAL | Status: DC
  Administered 2020-08-30 – 2020-08-31 (×3): 500 mg via ORAL
  Filled 2020-08-30 (×3): qty 1

## 2020-08-30 NOTE — Progress Notes (Signed)
PROGRESS NOTE    Raymond White  ZOX:[id number] DOB: Aug 16, 1951 DOA: 08/26/2020 PCP: Judeth Horn, PA-C   Chief Complaint  Patient presents with  . Shortness of Breath    Brief Narrative: 70 year old male with history of CAD, CHF diastolic EF 60% in August 2021, Parkinson's disease, atrial fibrillation history of CVA was admitted with worsening dyspnea on exertion, needing 3 L of oxygen in the ED.  Patient was admitted and treated for decompensated heart failure, had gained 20 pounds.  CT angiogram showed no PE or parenchymal disease but had elevated right diaphragm Cardiology was consulted, underwent PFT. Patient was having chronic back pain has had multiple surgeries in the past followed by [facility] neurosurgery was complaining of fecal incontinence with multiple bowel movement underwent MRI shows chronic finding no acute/emergent findings  Subjective:  Resting comfortably in the bedside chair.  No nausea vomiting, no weakness in the lower extremities. c/o multiple bowel movements/fecal incontinence for several days. Has chronic back pain. Reports he fell at home.  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  Seen by cardiology-no additional cardiac work-up advised.He will need to follow-up with pulmonary as outpatient  WJX:BJYNWGNF aspirin Plavix carvedilol and Lipitor, per cardiology.  Chronic low back pain s/p laminectomies and fusion in both lower thoracic and upper lumbar spine, has intermittent back pain,MRI done on admission show severe but stable spinal stenosis compared with MRI done 06/2020.Patient reports that he has "cauda equina"  and his neurosurgeon has said there is nothing that can be done.He does not want to have any surgery unless absolutely needed. He is able to move his extremities, c/o diminished sensation and able to feel- consulted neurosurgery for eval. Add lomotil/questran.  Enterococcus faecalis in urine, UA grossly abnormal on admission patient has difficulty peeing,will treat for UTI with amoxicillin 3 days course.  Anxiety/depression continue home psychotropic medication Parkinson's disease continue Sinemet Abnormal LFTs,slightly elevated total bili downtrending 2.5>1.6 As per cardiology eval record did not show any evidence of A. fib and patient was on a nontherapeutic dose of Pradaxa.  Thrombocytopenia, platelet stable>100k.  Nutrition: Diet Order            Diet Heart Room service appropriate? Yes; Fluid consistency: Thin; Fluid restriction: 1200 mL Fluid  Diet effective now                 Body mass index is 29.42 kg/m.  DVT prophylaxis: lovenox Code Status:   Code Status: Full Code  Family Communication: plan of care discussed with patient at bedside.  Status is: Inpatient  Remains inpatient appropriate because:Unsafe d/c plan Dispo: The patient is from: Home              Anticipated d/c is to: SNF              Anticipated d/c date is: 1 day              Patient currently is medically stable to d/c. Consultants:see note  Procedures:see note  Culture/Microbiology    Component Value Date/Time   SDES BLOOD LEFT ARM 08/26/2020 2329   SPECREQUEST  08/26/2020 2329    BOTTLES DRAWN AEROBIC AND ANAEROBIC Blood  Culture results may not be optimal due to an inadequate volume of blood received in culture bottles   CULT  08/26/2020 2329    NO GROWTH 3 DAYS Performed at [facility], [address], [place], [place] [address]    REPTSTATUS PENDING 08/26/2020 2329    Other culture-see note  Medications: Scheduled Meds: . aspirin  81 mg Oral Daily  . atorvastatin  80 mg Oral  QPM  . busPIRone  10 mg Oral TID  . calcium-vitamin D  1 tablet Oral BID  . carbidopa-levodopa  2 tablet Oral TID  . carvedilol  6.25 mg Oral BID WC  . cholecalciferol  1,000 Units Oral Daily  . clopidogrel  75 mg Oral Daily  . DULoxetine  90 mg Oral Daily  . enoxaparin (LOVENOX) injection  50 mg Subcutaneous Q24H  . famotidine  20 mg Oral Daily  . fluticasone furoate-vilanterol  1 puff Inhalation Daily  . pantoprazole  40 mg Oral Daily  . potassium chloride SA  40 mEq Oral BID  . ramelteon  8 mg Oral QHS  . tamsulosin  0.4 mg Oral QHS  . thiamine  100 mg Oral Daily   Continuous Infusions:  Antimicrobials: Anti-infectives (From admission, onward)   Start     Dose/Rate Route Frequency Ordered Stop   08/27/20 2200  cefTRIAXone (ROCEPHIN) 1 g in sodium chloride 0.9 % 100 mL IVPB  Status:  Discontinued        1 g 200 mL/hr over 30 Minutes Intravenous Every 24 hours 08/27/20 0517 08/27/20 1659   08/26/20 2215  cefTRIAXone (ROCEPHIN) 1 g in sodium chloride 0.9 % 100 mL IVPB        1 g 200 mL/hr over 30 Minutes Intravenous  Once 08/26/20 2200 08/27/20 0053     Objective: Vitals: Today's Vitals   08/30/20 0513 08/30/20 0734 08/30/20 0800 08/30/20 0816  BP: 113/70   122/86  Pulse: 68   74  Resp: 18   18  Temp: 98.5 F (36.9 C)  (!) 97.5 F (36.4 C) 97.6 F (36.4 C)  TempSrc: Oral   Oral  SpO2: 95% 94%  94%  Weight: 101.2 kg     Height:      PainSc:    8     Intake/Output Summary (Last 24 hours) at 08/30/2020 0825 Last data filed at 08/29/2020 2144 Gross per 24 hour  Intake 480 ml  Output 250 ml  Net 230 ml   Filed Weights   08/27/20 1839 08/29/20 0400 08/30/20 0513  Weight: 103.7 kg 103.3 kg 101.2 kg   Weight change: -2.132 kg  Intake/Output from previous day: 01/10 0701 - 01/11 0700 In: 720 [P.O.:720] Out: 250 [Urine:250] Intake/Output this shift: No intake/output data recorded.  Examination: General exam: AAOx3 ,NAD, weak appearing. HEENT:Oral mucosa moist,  Ear/Nose WNL grossly,dentition normal. Respiratory system: bilaterally diminished, no crackles,no wheezing or crackles,no use of accessory muscle, non tender. Cardiovascular system: S1 & S2 +, regular, No JVD. Gastrointestinal system: Abdomen soft, NT,ND, BS+. Nervous System:Alert, awake, moving extremities and grossly nonfocal, sensation intact on bilateral lower extremities and upper extremities. Extremities: No edema, distal peripheral pulses palpable.  Skin: No rashes,no icterus. MSK: Normal muscle bulk,tone, power  Data Reviewed: I have personally reviewed following labs and imaging studies CBC: Recent Labs  Lab 08/26/20 0933 08/26/20 1900 08/27/20 0735 08/28/20 0302 08/29/20 0329  WBC 7.7  --  6.6 5.3 5.3  NEUTROABS  --   --  5.1  --   --  HGB 15.5 13.9 14.6 13.5 13.0  HCT 46.9 41.0 41.7 39.6 39.6  MCV 101.7*  --  102.7* 101.5* 103.7*  PLT 143*  --  113* 111* 110*   Basic Metabolic Panel: Recent Labs  Lab 08/26/20 0933 08/26/20 1900 08/27/20 0735 08/28/20 0302 08/29/20 0329  NA 136 136 140 139 140  K 4.9 4.3 3.6 3.5 3.3*  CL 101  --  99 101 102  CO2 18*  --  25 27 29   GLUCOSE 102*  --  95 119* 120*  BUN 12  --  14 18 17   CREATININE 0.93  --  1.07 0.95 0.84  CALCIUM 8.6*  --  8.3* 8.5* 8.5*  MG  --   --  2.1  --   --    GFR: Estimated Creatinine Clearance: 103.8 mL/min (by C-G formula based on SCr of 0.84 mg/dL). Liver Function Tests: Recent Labs  Lab 08/27/20 0735 08/28/20 0302  AST 21 18  ALT 18 <5  ALKPHOS 84 75  BILITOT 2.5* 1.6*  PROT 5.8* 5.6*  ALBUMIN 3.2* 3.2*   No results for input(s): LIPASE, AMYLASE in the last 168 hours. No results for input(s): AMMONIA in the last 168 hours. Coagulation Profile: No results for input(s): INR, PROTIME in the last 168 hours. Cardiac Enzymes: No results for input(s): CKTOTAL, CKMB, CKMBINDEX, TROPONINI in the last 168 hours. BNP (last 3 results) No results for input(s): PROBNP in the last 8760  hours. HbA1C: No results for input(s): HGBA1C in the last 72 hours. CBG: No results for input(s): GLUCAP in the last 168 hours. Lipid Profile: No results for input(s): CHOL, HDL, LDLCALC, TRIG, CHOLHDL, LDLDIRECT in the last 72 hours. Thyroid Function Tests: No results for input(s): TSH, T4TOTAL, FREET4, T3FREE, THYROIDAB in the last 72 hours. Anemia Panel: No results for input(s): VITAMINB12, FOLATE, FERRITIN, TIBC, IRON, RETICCTPCT in the last 72 hours. Sepsis Labs: Recent Labs  Lab 08/26/20 2329  LATICACIDVEN 1.2    Recent Results (from the past 240 hour(s))  Resp Panel by RT-PCR (Flu A&B, Covid) Nasopharyngeal Swab     Status: None   Collection Time: 08/26/20  5:25 PM   Specimen: Nasopharyngeal Swab; Nasopharyngeal(NP) swabs in vial transport medium  Result Value Ref Range Status   SARS Coronavirus 2 by RT PCR NEGATIVE NEGATIVE Final    Comment: (NOTE) SARS-CoV-2 target nucleic acids are NOT DETECTED.  The SARS-CoV-2 RNA is generally detectable in upper respiratory specimens during the acute phase of infection. The lowest concentration of SARS-CoV-2 viral copies this assay can detect is 138 copies/mL. A negative result does not preclude SARS-Cov-2 infection and should not be used as the sole basis for treatment or other patient management decisions. A negative result may occur with  improper specimen collection/handling, submission of specimen other than nasopharyngeal swab, presence of viral mutation(s) within the areas targeted by this assay, and inadequate number of viral copies(<138 copies/mL). A negative result must be combined with clinical observations, patient history, and epidemiological information. The expected result is Negative.  Fact Sheet for Patients:  BloggerCourse.comhttps://www.fda.gov/media/152166/download  Fact Sheet for Healthcare Providers:  SeriousBroker.ithttps://www.fda.gov/media/152162/download  This test is no t yet approved or cleared by the [place] FDA and  has  been authorized for detection and/or diagnosis of SARS-CoV-2 by FDA under an Emergency Use Authorization (EUA). This EUA will remain  in effect (meaning this test can be used) for the duration of the COVID-19 declaration under Section 564(b)(1) of the Act, 21 U.S.C.section 360bbb-3(b)(1), unless the authorization  is terminated  or revoked sooner.       Influenza A by PCR NEGATIVE NEGATIVE Final   Influenza B by PCR NEGATIVE NEGATIVE Final    Comment: (NOTE) The Xpert Xpress SARS-CoV-2/FLU/RSV plus assay is intended as an aid in the diagnosis of influenza from Nasopharyngeal swab specimens and should not be used as a sole basis for treatment. Nasal washings and aspirates are unacceptable for Xpert Xpress SARS-CoV-2/FLU/RSV testing.  Fact Sheet for Patients: BloggerCourse.com  Fact Sheet for Healthcare Providers: SeriousBroker.it  This test is not yet approved or cleared by the [place] FDA and has been authorized for detection and/or diagnosis of SARS-CoV-2 by FDA under an Emergency Use Authorization (EUA). This EUA will remain in effect (meaning this test can be used) for the duration of the COVID-19 declaration under Section 564(b)(1) of the Act, 21 U.S.C. section 360bbb-3(b)(1), unless the authorization is terminated or revoked.  Performed at [facility], [address], [place], [place] [address]   Urine culture     Status: Abnormal (Preliminary result)   Collection Time: 08/26/20  9:16 PM   Specimen: Urine, Clean Catch  Result Value Ref Range Status   Specimen Description URINE, CLEAN CATCH  Final   Special Requests ADDED 2116 08/27/2020  Final   Culture (A)  Final    >=100,000 COLONIES/mL ENTEROCOCCUS FAECALIS SUSCEPTIBILITIES TO FOLLOW Performed at [facility], [address], [place], [place] [address]    Report Status PENDING  Incomplete  Blood culture (routine x 2)     Status: None (Preliminary  result)   Collection Time: 08/26/20 11:29 PM   Specimen: BLOOD LEFT ARM  Result Value Ref Range Status   Specimen Description BLOOD LEFT ARM  Final   Special Requests   Final    BOTTLES DRAWN AEROBIC AND ANAEROBIC Blood Culture results may not be optimal due to an inadequate volume of blood received in culture bottles   Culture   Final    NO GROWTH 3 DAYS Performed at [facility], [address] [place], [place] [address]    Report Status PENDING  Incomplete     Radiology Studies: DG Chest Port 1 View  Result Date: 08/28/2020 CLINICAL DATA:  Hypoxia EXAM: PORTABLE CHEST 1 VIEW COMPARISON:  August 26, 2020 FINDINGS: The cardiomediastinal silhouette is stable. No pneumothorax. Right basilar atelectasis, unchanged. Elevation right hemidiaphragm identified. Mild opacity in the left base is new in the interval, favored to represent atelectasis. Air under the right hemidiaphragm is noted to be interposed colon seen on previous CT imaging. No other acute abnormalities. IMPRESSION: 1. New mild opacity in the left base, favored to represent atelectasis. 2. Stable right basilar atelectasis and elevation of the right hemidiaphragm. Electronically Signed   By: Gerome Sam III M.D   On: 08/28/2020 19:17   ECHOCARDIOGRAM COMPLETE  Result Date: 08/29/2020    ECHOCARDIOGRAM REPORT   Patient Name:   Raymond White Date of Exam: 08/29/2020 Medical Rec #:  [id number]         Height:       73.0 in Accession #:    [id number]        Weight:       227.7 lb Date of Birth:  1951-06-16         BSA:          2.274 m Patient Age:    69 years          BP:  102/59 mmHg Patient Gender: M                 HR:           64 bpm. Exam Location:  Inpatient Procedure: 2D Echo Indications:    dyspnea.  History:        Patient has prior history of Echocardiogram examinations, most                 recent 08/29/2020. CHF, Parkinson's; Risk Factors:Hypertension.  Sonographer:    Delcie Roch Referring Phys: [id number]  DAYNA N DUNN  Sonographer Comments: Suboptimal subcostal window. IMPRESSIONS  1. Left ventricular ejection fraction, by estimation, is 55 to 60%. The left ventricle has normal function. The left ventricle has no regional wall motion abnormalities. Left ventricular diastolic parameters are consistent with Grade I diastolic dysfunction (impaired relaxation).  2. Right ventricular systolic function is normal. The right ventricular size is normal. There is normal pulmonary artery systolic pressure.  3. The mitral valve is normal in structure. Trivial mitral valve regurgitation. No evidence of mitral stenosis.  4. The aortic valve is normal in structure. Aortic valve regurgitation is mild. No aortic stenosis is present.  5. Aortic dilatation noted. There is mild dilatation of the ascending aorta, measuring 39 mm. There is mild dilatation at the level of the sinuses of Valsalva, measuring 38 mm.  6. The inferior vena cava is normal in size with greater than 50% respiratory variability, suggesting right atrial pressure of 3 mmHg. FINDINGS  Left Ventricle: Left ventricular ejection fraction, by estimation, is 55 to 60%. The left ventricle has normal function. The left ventricle has no regional wall motion abnormalities. The left ventricular internal cavity size was normal in size. There is  no left ventricular hypertrophy. Left ventricular diastolic parameters are consistent with Grade I diastolic dysfunction (impaired relaxation). Normal left ventricular filling pressure. Right Ventricle: The right ventricular size is normal. No increase in right ventricular wall thickness. Right ventricular systolic function is normal. There is normal pulmonary artery systolic pressure. The tricuspid regurgitant velocity is 2.74 m/s, and  with an assumed right atrial pressure of 3 mmHg, the estimated right ventricular systolic pressure is 33.0 mmHg. Left Atrium: Left atrial size was normal in size. Right Atrium: Right atrial size was  normal in size. Pericardium: There is no evidence of pericardial effusion. Mitral Valve: The mitral valve is normal in structure. Mild mitral annular calcification. Trivial mitral valve regurgitation. No evidence of mitral valve stenosis. Tricuspid Valve: The tricuspid valve is normal in structure. Tricuspid valve regurgitation is trivial. No evidence of tricuspid stenosis. Aortic Valve: The aortic valve is normal in structure. Aortic valve regurgitation is mild. No aortic stenosis is present. Pulmonic Valve: The pulmonic valve was normal in structure. Pulmonic valve regurgitation is trivial. No evidence of pulmonic stenosis. Aorta: Aortic dilatation noted. There is mild dilatation of the ascending aorta, measuring 39 mm. There is mild dilatation at the level of the sinuses of Valsalva, measuring 38 mm. Venous: The inferior vena cava is normal in size with greater than 50% respiratory variability, suggesting right atrial pressure of 3 mmHg. IAS/Shunts: No atrial level shunt detected by color flow Doppler.  LEFT VENTRICLE PLAX 2D LVIDd:         5.20 cm  Diastology LVIDs:         3.50 cm  LV e' medial:    7.07 cm/s LV PW:         1.00 cm  LV E/e' medial:  7.8 LV IVS:        1.10 cm  LV e' lateral:   10.20 cm/s LVOT diam:     2.20 cm  LV E/e' lateral: 5.4 LV SV:         65 LV SV Index:   29 LVOT Area:     3.80 cm  RIGHT VENTRICLE RV S prime:     12.20 cm/s TAPSE (M-mode): 1.9 cm LEFT ATRIUM             Index LA diam:        4.40 cm 1.94 cm/m LA Vol (A2C):   67.0 ml 29.47 ml/m LA Vol (A4C):   73.2 ml 32.20 ml/m LA Biplane Vol: 74.8 ml 32.90 ml/m  AORTIC VALVE LVOT Vmax:   78.30 cm/s LVOT Vmean:  52.300 cm/s LVOT VTI:    0.171 m  AORTA Ao Root diam: 3.80 cm Ao Asc diam:  3.90 cm MITRAL VALVE               TRICUSPID VALVE MV Area (PHT): 4.21 cm    TR Peak grad:   30.0 mmHg MV Decel Time: 180 msec    TR Vmax:        274.00 cm/s MV E velocity: 54.80 cm/s MV A velocity: 66.40 cm/s  SHUNTS MV E/A ratio:  0.83         Systemic VTI:  0.17 m                            Systemic Diam: 2.20 cm Armanda Magicraci Turner MD Electronically signed by Armanda Magicraci Turner MD Signature Date/Time: 08/29/2020/1:08:28 PM    Final      LOS: 3 days   Lanae Boastamesh Imran Nuon, MD [facility]  08/30/2020, 8:25 AM

## 2020-08-30 NOTE — Progress Notes (Signed)
Physical Therapy Treatment Patient Details Name: Raymond White MRN: [id number] DOB: 1951-03-27 Today's Date: 08/30/2020    History of Present Illness 70 year old with history significant for coronary artery disease status post stenting, atrial fibrillation,  heart failure with preserved ejection fraction, Parkinson's disease,  and cerebrovascular disease presenting with acute hypoxemic respiratory failure likely secondary to exacerbation of his heart failure.    PT Comments    Pt making steady improvement towards his physical therapy goals. Ambulating 80 feet with a Rollator at a min guard assist level. SpO2 96% on RA, HR stable 70-80's. Pt continues with decreased endurance and balance. Presents as a high fall risk based on decreased gait speed and history of falls. In light of deficits and decreased caregiver support, recommend SNF.     Follow Up Recommendations  SNF     Equipment Recommendations  None recommended by PT    Recommendations for Other Services       Precautions / Restrictions Precautions Precautions: Fall Precaution Comments: Pt reports 3 falls down his steps within the past 6 mos Restrictions Weight Bearing Restrictions: No    Mobility  Bed Mobility               General bed mobility comments: OOB in chair  Transfers Overall transfer level: Needs assistance Equipment used: Rolling walker (2 wheeled);None Transfers: Sit to/from Stand Sit to Stand: Supervision         General transfer comment: Supervision for safety  Ambulation/Gait Ambulation/Gait assistance: Min guard Gait Distance (Feet): 80 Feet Assistive device: Rolling walker (2 wheeled) Gait Pattern/deviations: Step-through pattern;Decreased stride length;Decreased dorsiflexion - right;Decreased dorsiflexion - left;Shuffle;Narrow base of support     General Gait Details: Slow and steady pace, min cues for activity pacing, decreased bilateral foot clearance. Min guard for  Futures trader    Modified Rankin (Stroke Patients Only)       Balance Overall balance assessment: Needs assistance Sitting-balance support: No upper extremity supported Sitting balance-Leahy Scale: Good     Standing balance support: No upper extremity supported;During functional activity Standing balance-Leahy Scale: Fair                              Cognition Arousal/Alertness: Awake/alert Behavior During Therapy: WFL for tasks assessed/performed Overall Cognitive Status: Impaired/Different from baseline Area of Impairment: Safety/judgement;Awareness;Problem solving                         Safety/Judgement: Decreased awareness of safety;Decreased awareness of deficits Awareness: Emergent Problem Solving: Requires verbal cues General Comments: Able to verbalize deficits      Exercises Other Exercises Other Exercises: x5 Sit to stands    General Comments        Pertinent Vitals/Pain Pain Assessment: No/denies pain    Home Living                      Prior Function            PT Goals (current goals can now be found in the care plan section) Acute Rehab PT Goals Patient Stated Goal: To get stronger Potential to Achieve Goals: Good Progress towards PT goals: Progressing toward goals    Frequency    Min 3X/week      PT Plan Discharge plan needs to be updated    Co-evaluation  AM-PAC PT "6 Clicks" Mobility   Outcome Measure  Help needed turning from your back to your side while in a flat bed without using bedrails?: None Help needed moving from lying on your back to sitting on the side of a flat bed without using bedrails?: None Help needed moving to and from a bed to a chair (including a wheelchair)?: None Help needed standing up from a chair using your arms (e.g., wheelchair or bedside chair)?: None Help needed to walk in hospital room?: A Little   6  Click Score: 19    End of Session Equipment Utilized During Treatment: Gait belt Activity Tolerance: Patient tolerated treatment well Patient left: in chair;with call bell/phone within reach Nurse Communication: Mobility status PT Visit Diagnosis: Unsteadiness on feet (R26.81);Other abnormalities of gait and mobility (R26.89);Muscle weakness (generalized) (M62.81)     Time: 1413-1430 PT Time Calculation (min) (ACUTE ONLY): 17 min  Charges:  $Therapeutic Activity: 8-22 mins                     Lillia Pauls, PT, DPT Acute Rehabilitation Services Pager [phone] Office [phone]    [name] 08/30/2020, 3:57 PM

## 2020-08-30 NOTE — Progress Notes (Signed)
Progress Note  Patient Name: Raymond White Date of Encounter: 08/30/2020  [facility] Cardiologist: Ailea Rhatigan   Subjective   70 yo with progressive dyspnea.  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   He was on a non theraputic dose of pradaxa ( 75 mg daily )    Inpatient Medications    Scheduled Meds: . aspirin  81 mg Oral Daily  . atorvastatin  80 mg Oral QPM  . busPIRone  10 mg Oral TID  . calcium-vitamin D  1 tablet Oral BID  . carbidopa-levodopa  2 tablet Oral TID  . carvedilol  6.25 mg Oral BID WC  . cholecalciferol  1,000 Units Oral Daily  . clopidogrel  75 mg Oral Daily  . DULoxetine  90 mg Oral Daily  . enoxaparin (LOVENOX) injection  50 mg Subcutaneous Q24H  . famotidine  20 mg Oral Daily  . fluticasone furoate-vilanterol  1 puff Inhalation Daily  . pantoprazole  40 mg Oral Daily  . potassium chloride SA  40 mEq Oral BID  . ramelteon  8 mg Oral QHS  . tamsulosin  0.4 mg Oral QHS  . thiamine  100 mg Oral Daily   Continuous Infusions:  PRN Meds: acetaminophen **OR** acetaminophen, albuterol, docusate sodium, ondansetron   Vital Signs    Vitals:   08/29/20 1953 08/30/20 0513 08/30/20 0734 08/30/20 0816  BP: 100/72 113/70  122/86  Pulse: 77 68  74  Resp: 18 18  18   Temp: 98 F (36.7 C) 98.5 F (36.9 C)  97.6 F (36.4 C)  TempSrc: Oral Oral  Oral  SpO2: 94% 95% 94% 94%  Weight:  101.2 kg    Height:        Intake/Output Summary (Last 24 hours) at 08/30/2020 0932 Last data filed at 08/30/2020 0831 Gross per 24 hour  Intake 600 ml  Output 250 ml  Net 350 ml   Last 3 Weights 08/30/2020 08/29/2020 08/27/2020  Weight (lbs) 223  lb 227 lb 11.2 oz 228 lb 11.2 oz  Weight (kg) 101.152 kg 103.284 kg 103.738 kg      Telemetry    NSR  - Personally Reviewed  ECG    NSR - Personally Reviewed  Physical Exam  Physical Exam: Blood pressure 122/86, pulse 74, temperature 97.6 F (36.4 C), temperature source Oral, resp. rate 18, height 6\' 1"  (1.854 m), weight 101.2 kg, SpO2 94 %.  GEN:   Elderly male,  HEENT: Normal NECK: No JVD; No carotid bruits LYMPHATICS: No lymphadenopathy CARDIAC: RRR , no murmurs, rubs, gallops RESPIRATORY:  Greatly reduced breath sound R lung field.  ABDOMEN: Soft, non-tender, non-distended MUSCULOSKELETAL:  No edema; No deformity  SKIN: Warm and dry NEUROLOGIC:  Alert and oriented x 3   Labs    High Sensitivity Troponin:   Recent Labs  Lab 08/26/20 0933 08/26/20 1904  TROPONINIHS 9 11      Chemistry Recent Labs  Lab 08/27/20 0735 08/28/20 0302 08/29/20 0329  NA 140 139 140  K 3.6 3.5 3.3*  CL 99 101 102  CO2 25 27 29   GLUCOSE 95 119* 120*  BUN 14 18  17  CREATININE 1.07 0.95 0.84  CALCIUM 8.3* 8.5* 8.5*  PROT 5.8* 5.6*  --   ALBUMIN 3.2* 3.2*  --   AST 21 18  --   ALT 18 <5  --   ALKPHOS 84 75  --   BILITOT 2.5* 1.6*  --   GFRNONAA >60 >60 >60  ANIONGAP 16* 11 9     Hematology Recent Labs  Lab 08/27/20 0735 08/28/20 0302 08/29/20 0329  WBC 6.6 5.3 5.3  RBC 4.06* 3.90* 3.82*  HGB 14.6 13.5 13.0  HCT 41.7 39.6 39.6  MCV 102.7* 101.5* 103.7*  MCH 36.0* 34.6* 34.0  MCHC 35.0 34.1 32.8  RDW 13.2 13.1 13.0  PLT 113* 111* 110*    BNP Recent Labs  Lab 08/26/20 1657  BNP 124.4*     DDimer No results for input(s): DDIMER in the last 168 hours.   Radiology    DG Chest Port 1 View  Result Date: 08/28/2020 CLINICAL DATA:  Hypoxia EXAM: PORTABLE CHEST 1 VIEW COMPARISON:  August 26, 2020 FINDINGS: The cardiomediastinal silhouette is stable. No pneumothorax. Right basilar atelectasis, unchanged. Elevation right hemidiaphragm identified. Mild opacity in  the left base is new in the interval, favored to represent atelectasis. Air under the right hemidiaphragm is noted to be interposed colon seen on previous CT imaging. No other acute abnormalities. IMPRESSION: 1. New mild opacity in the left base, favored to represent atelectasis. 2. Stable right basilar atelectasis and elevation of the right hemidiaphragm. Electronically Signed   By: Gerome Samavid  Williams III M.D   On: 08/28/2020 19:17   ECHOCARDIOGRAM COMPLETE  Result Date: 08/29/2020    ECHOCARDIOGRAM REPORT   Patient Name:   Raymond White Date of Exam: 08/29/2020 Medical Rec #:  [id number]         Height:       73.0 in Accession #:    [id number]        Weight:       227.7 lb Date of Birth:  1951-07-19         BSA:          2.274 m Patient Age:    69 years          BP:           102/59 mmHg Patient Gender: M                 HR:           64 bpm. Exam Location:  Inpatient Procedure: 2D Echo Indications:    dyspnea.  History:        Patient has prior history of Echocardiogram examinations, most                 recent 08/29/2020. CHF, Parkinson's; Risk Factors:Hypertension.  Sonographer:    Delcie RochLauren Pennington Referring Phys: [id number] DAYNA N DUNN  Sonographer Comments: Suboptimal subcostal window. IMPRESSIONS  1. Left ventricular ejection fraction, by estimation, is 55 to 60%. The left ventricle has normal function. The left ventricle has no regional wall motion abnormalities. Left ventricular diastolic parameters are consistent with Grade I diastolic dysfunction (impaired relaxation).  2. Right ventricular systolic function is normal. The right ventricular size is normal. There is normal pulmonary artery systolic pressure.  3. The mitral valve is normal in structure. Trivial mitral valve regurgitation. No evidence of mitral stenosis.  4. The aortic valve is normal in structure. Aortic valve regurgitation is mild. No aortic stenosis is present.  5. Aortic dilatation  noted. There is mild dilatation of the ascending aorta,  measuring 39 mm. There is mild dilatation at the level of the sinuses of Valsalva, measuring 38 mm.  6. The inferior vena cava is normal in size with greater than 50% respiratory variability, suggesting right atrial pressure of 3 mmHg. FINDINGS  Left Ventricle: Left ventricular ejection fraction, by estimation, is 55 to 60%. The left ventricle has normal function. The left ventricle has no regional wall motion abnormalities. The left ventricular internal cavity size was normal in size. There is  no left ventricular hypertrophy. Left ventricular diastolic parameters are consistent with Grade I diastolic dysfunction (impaired relaxation). Normal left ventricular filling pressure. Right Ventricle: The right ventricular size is normal. No increase in right ventricular wall thickness. Right ventricular systolic function is normal. There is normal pulmonary artery systolic pressure. The tricuspid regurgitant velocity is 2.74 m/s, and  with an assumed right atrial pressure of 3 mmHg, the estimated right ventricular systolic pressure is 33.0 mmHg. Left Atrium: Left atrial size was normal in size. Right Atrium: Right atrial size was normal in size. Pericardium: There is no evidence of pericardial effusion. Mitral Valve: The mitral valve is normal in structure. Mild mitral annular calcification. Trivial mitral valve regurgitation. No evidence of mitral valve stenosis. Tricuspid Valve: The tricuspid valve is normal in structure. Tricuspid valve regurgitation is trivial. No evidence of tricuspid stenosis. Aortic Valve: The aortic valve is normal in structure. Aortic valve regurgitation is mild. No aortic stenosis is present. Pulmonic Valve: The pulmonic valve was normal in structure. Pulmonic valve regurgitation is trivial. No evidence of pulmonic stenosis. Aorta: Aortic dilatation noted. There is mild dilatation of the ascending aorta, measuring 39 mm. There is mild dilatation at the level of the sinuses of Valsalva, measuring  38 mm. Venous: The inferior vena cava is normal in size with greater than 50% respiratory variability, suggesting right atrial pressure of 3 mmHg. IAS/Shunts: No atrial level shunt detected by color flow Doppler.  LEFT VENTRICLE PLAX 2D LVIDd:         5.20 cm  Diastology LVIDs:         3.50 cm  LV e' medial:    7.07 cm/s LV PW:         1.00 cm  LV E/e' medial:  7.8 LV IVS:        1.10 cm  LV e' lateral:   10.20 cm/s LVOT diam:     2.20 cm  LV E/e' lateral: 5.4 LV SV:         65 LV SV Index:   29 LVOT Area:     3.80 cm  RIGHT VENTRICLE RV S prime:     12.20 cm/s TAPSE (M-mode): 1.9 cm LEFT ATRIUM             Index LA diam:        4.40 cm 1.94 cm/m LA Vol (A2C):   67.0 ml 29.47 ml/m LA Vol (A4C):   73.2 ml 32.20 ml/m LA Biplane Vol: 74.8 ml 32.90 ml/m  AORTIC VALVE LVOT Vmax:   78.30 cm/s LVOT Vmean:  52.300 cm/s LVOT VTI:    0.171 m  AORTA Ao Root diam: 3.80 cm Ao Asc diam:  3.90 cm MITRAL VALVE               TRICUSPID VALVE MV Area (PHT): 4.21 cm    TR Peak grad:   30.0 mmHg MV Decel Time: 180 msec    TR Vmax:  274.00 cm/s MV E velocity: 54.80 cm/s MV A velocity: 66.40 cm/s  SHUNTS MV E/A ratio:  0.83        Systemic VTI:  0.17 m                            Systemic Diam: 2.20 cm Armanda Magic MD Electronically signed by Armanda Magic MD Signature Date/Time: 08/29/2020/1:08:28 PM    Final     Cardiac Studies     Patient Profile     70 y.o. male admitted with increased dyspnea  Assessment & Plan    1.   Increased shortness of breath:   His LV systolic function is normal and he has grade 1 diastolic dysfunction.  BNP was 124 which is not suggestive of massive volume overload.  PFTs show changes c/w emphasemia but this could be due to his paralyzed right hemidirphragm.  No additional cardiac suggestions Further evaluation and management per Int. Med team   2. CVA :  Cont asa and plavix     [facility] will sign off.   Medication Recommendations:   Other recommendations (labs,  testing, etc):   Follow up as an outpatient:  With medical doctor.   He does not need cardiology follow up   For questions or updates, please contact [facility] Please consult [email or web address] for contact info under        Signed, Kristeen Miss, MD  08/30/2020, 9:32 AM

## 2020-08-30 NOTE — TOC Progression Note (Signed)
Transition of Care [facility]) - Progression Note    Patient Details  Name: Raymond White MRN: [id number] Date of Birth: 1951/04/30  Transition of [facility] CM/SW Contact  Kermit Balo, RN Phone Number: 08/30/2020, 3:14 PM  Clinical Narrative:    Pt asking for SNF rehab in [facility] but none are able to offer him a bed in that area. He has a bed offer from [facility]. He has accepted the offer and CM updated [facility]. They are hoping for an available bed tomorrow.  Covid test ordered.  TOC following.   Expected Discharge Plan: Skilled Nursing Facility Barriers to Discharge: Continued Medical Work up  Expected Discharge Plan and Services Expected Discharge Plan: Skilled Nursing Facility In-house Referral: Clinical Social Work Discharge Planning Services: CM Consult Post Acute Care Choice: Skilled Nursing Facility Living arrangements for the past 2 months: Apartment                                       Social Determinants of Health (SDOH) Interventions    Readmission Risk Interventions No flowsheet data found.

## 2020-08-30 NOTE — Consult Note (Signed)
   Providing Compassionate, Quality Care - Together  Neurosurgery Consult  Referring physician: Dr. Lanae Boast Reason for referral: Fecal incontinence  Chief Complaint: Fall, fecal incontinence  History of Present Illness: This is a 70 yo M with Parkinsons Dz, multiple spinal surgeries (cervical fusion, thoracic decompression/fusion, lumbar decompression, most recently at [place] in [place], per patient had cauda equina then) that complains of a ground level fall one week ago. Since then he complains of numbness from the umbilicus down through his legs, with fecal incontinence. He denies urinary incontinence. He denies focal weakness, but complains of generalized weakness.    Medications: I have reviewed the patient's current medications. Allergies: No Known Allergies  History reviewed. No pertinent family history. Social History:  has no history on file for tobacco use, alcohol use, and drug use.  ROS: 14 pt reviewed  Physical Exam:  Vital signs in last 24 hours: Temp:  [98 F (36.7 C)-98.3 F (36.8 C)] 98 F (36.7 C) (07/25 1814) Pulse Rate:  [58-128] 65 (07/26 0746) Resp:  [11-18] 14 (07/26 0217) BP: (138-182)/(65-125) 153/88 (07/26 0700) SpO2:  [91 %-98 %] 96 % (07/26 0746) PE: AAOx3 PERRLA EOMI Face symmetric BUE 4+/5 BLE 4+/5 Light touch decreased from -T10 down  Imaging: MRI L spine shows advanced degen without focal significant central stenosis CT chest: no fractures, previous thoracic lami and non instrumented fusion noted.  Impression/Assessment:  70 yo M with:  1. Thoracic numbness since fall 2. Fecal incontinence  Plan:  -Recommend MRI T spine  -MRI L spine does not explain fecal incontinence -pt/ot -pain control -further recs once imaging completed    Thank you for allowing me to participate in this patient's care.  Please do not hesitate to call with questions or concerns.   Monia Pouch, DO Neurosurgeon [facility] Cell: [phone]

## 2020-08-30 NOTE — Progress Notes (Signed)
Pt does not c/o any pain. Pt's appetite is fair but oral intake is adequate. Covid swab was done today, Pt will be going to a SNF once a bed is available. Pt's needs are met and safety measures are maintained.

## 2020-08-31 DIAGNOSIS — I5033 Acute on chronic diastolic (congestive) heart failure: Secondary | ICD-10-CM | POA: Diagnosis not present

## 2020-08-31 MED ORDER — FAMOTIDINE 20 MG PO TABS: 20.0000 mg | ORAL_TABLET | Freq: Two times a day (BID) | ORAL | Status: AC

## 2020-08-31 MED ORDER — AMOXICILLIN 500 MG PO CAPS: 500.0000 mg | ORAL_CAPSULE | Freq: Three times a day (TID) | ORAL | 0 refills | Status: AC

## 2020-08-31 MED ORDER — ATORVASTATIN CALCIUM 80 MG PO TABS: 80.0000 mg | ORAL_TABLET | Freq: Every evening | ORAL | Status: DC

## 2020-08-31 MED ORDER — FUROSEMIDE 20 MG PO TABS: 20.0000 mg | ORAL_TABLET | Freq: Every day | ORAL | Status: DC

## 2020-08-31 MED ORDER — BUSPIRONE HCL 10 MG PO TABS: 10.0000 mg | ORAL_TABLET | Freq: Three times a day (TID) | ORAL | Status: AC

## 2020-08-31 MED ORDER — VITAMIN B-1 100 MG PO TABS: 100.0000 mg | ORAL_TABLET | Freq: Every day | ORAL | Status: DC

## 2020-08-31 MED ORDER — CHOLECALCIFEROL 25 MCG (1000 UT) PO CAPS: 1000.0000 [IU] | ORAL_CAPSULE | Freq: Every day | ORAL | Status: DC

## 2020-08-31 MED ORDER — DIPHENOXYLATE-ATROPINE 2.5-0.025 MG PO TABS: 1.0000 | ORAL_TABLET | Freq: Three times a day (TID) | ORAL | 0 refills | Status: AC | PRN

## 2020-08-31 MED ORDER — CHOLESTYRAMINE 4 G PO PACK: 4.0000 g | PACK | Freq: Three times a day (TID) | ORAL | 0 refills | Status: DC

## 2020-08-31 MED ORDER — CARVEDILOL 6.25 MG PO TABS: 6.2500 mg | ORAL_TABLET | Freq: Two times a day (BID) | ORAL | Status: DC

## 2020-08-31 NOTE — Plan of Care (Signed)

## 2020-08-31 NOTE — Plan of Care (Signed)
  Problem: Activity: Goal: Risk for activity intolerance will decrease 08/31/2020 0037 by Carolanne Grumbling, RN Outcome: Progressing 08/31/2020 0037 by Carolanne Grumbling, RN Outcome: Progressing   Problem: Safety: Goal: Ability to remain free from injury will improve Outcome: Progressing

## 2020-08-31 NOTE — Progress Notes (Signed)
   Providing Compassionate, Quality Care - Together  NEUROSURGERY PROGRESS NOTE   S: No issues overnight.   O: EXAM:  BP 118/75 (BP Location: Right Arm)   Pulse 73   Temp (!) 97.5 F (36.4 C) (Oral)   Resp 18   Ht 6\' 1"  (1.854 m)   Wt 104.2 kg Comment: scale b  SpO2 94%   BMI 30.31 kg/m   Awake, alert, oriented  Speech fluent, appropriate  CNs grossly intact  4+/5 BUE/BLE  Bilateral lower extremity patellar/Achilles deep tendon reflexes 2/4, no clonus  ASSESSMENT:  70 y.o. male with   1. Thoracic numbness since fall 2. Fecal incontinence  Plan:  -MRI T-spine shows no significant stenosis or compression, no explanation for fecal incontinence -MRI L spine does not explain fecal incontinence -pt/ot -pain control -No neurosurgical intervention recommended, follow-up as needed   Thank you for allowing me to participate in this patient's care.  Please do not hesitate to call with questions or concerns.   78, DO Neurosurgeon [facility] Cell: [phone]

## 2020-08-31 NOTE — Discharge Summary (Addendum)
Physician Discharge Summary  Raymond KatosRichard E Faulkner ZOX:[id number] DOB: [birth year] DOA: 08/26/2020  PCP: Judeth Hornamsey, Robert M, PA-C  Admit date: 08/26/2020 Discharge date: 08/31/2020  Admitted From: Home Disposition: SNF  Recommendations for Outpatient Follow-up:  1. Follow up with PCP in 1-2 weeks 2. Please obtain BMP/CBC in one week 3. Please follow up on the following pending results:  Home Health:NO  Equipment/Devices: NONE  Discharge Condition: Stable Code Status:   Code Status: Full Code Diet recommendation:  Diet Order            Diet - low sodium heart healthy           Diet Heart Room service appropriate? Yes; Fluid consistency: Thin; Fluid restriction: 1200 mL Fluid  Diet effective now                  Brief/Interim Summary: 70 year old male with history of CAD, CHF diastolic EF 60% in August 2021, Parkinson's disease, atrial fibrillation history of CVA was admitted with worsening dyspnea on exertion, needing 3 L of oxygen in the ED.  Patient was admitted and treated for decompensated heart failure, had gained 20 pounds.  CT angiogram showed no PE or parenchymal disease but had elevated right diaphragm Cardiology was consulted, underwent PFT. Patient was having chronic back pain has had multiple surgeries in the past [facility] was complaining of fecal incontinence with multiple bowel movement underwent MRI shows chronic finding no acute/emergent findings.  He was seen by Dr. Jake Samplesawley from neurosurgery, underwent MRI T-spine no acute finding on the spine.  Patient's symptoms of fecal incontinence much better with Lomotil and Questran.  He is agreeable for discharge to skilled nursing facility today.  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  Seen by cardiology-no additional cardiac work-up advised.breathing status is stable on room air.  Advise outpatient pulmonary follow-up.    WJX:[id number]:Continue aspirin Plavix carvedilol and Lipitor, per cardiology.  No chest pain.  Chronic low back pain s/p laminectomies and fusion in both lower thoracic and upper lumbar spine, has intermittent back pain,MRI doneon admission show severe but stable spinal stenosiscompared with MRI done 06/2020.Patient reports that he has "cauda equina" and his neurosurgeon has said there is nothing that can be done.He does not want to have any surgery unless absolutely needed.  He was seen by Dr. Jake Samplesawley from neurosurgery MRI T-spine was done no acute finding okay to discharge to skilled nursing facility.  Can continue symptomatic management Lomotil Questran.  Recent fall at home: patient has no rib pain chest pain,  MRI showed  Edema within the proximal left ribs and left posterior elements of T8 and T9 but patient is nontender there. Continue symptomatic management.  He is being discharged to skilled nursing facility  Enterococcus faecalis in urine, UA grossly abnormal on admission patient has difficulty peeing,will treat for UTI with amoxicillin 3 days course.  Anxiety/depression continue home psychotropic medication Parkinson's disease continue Sinemet Abnormal LFTs,slightly elevated total bili downtrending 2.5>1.6 As per cardiology eval record did not show any evidence of A. fib and patient was on a nontherapeutic dose of Pradaxa.  Thrombocytopenia, platelet stable>100k  Consults:  Cardiology and neurosurgery  Subjective: Alert awake feels much better in terms of bowel movement.  Ready for discharge to skilled nursing facility today Discharge Exam: Vitals:   08/31/20 0904 08/31/20 1004   BP:  109/76  Pulse:  73  Resp:  20  Temp:  (!) 97.4 F (36.3 C)  SpO2: 93% 95%   General: Pt is alert, awake, not in acute distress Cardiovascular: RRR, S1/S2 +, no rubs, no gallops Respiratory: CTA bilaterally, no wheezing, no rhonchi Abdominal: Soft, NT, ND, bowel sounds + Extremities: no edema, no cyanosis  Discharge Instructions  Discharge Instructions    (HEART FAILURE PATIENTS) Call MD:  Anytime you have any of the following symptoms: 1) 3 pound weight gain in 24 hours or 5 pounds in 1 week 2) shortness of breath, with or without a dry hacking cough 3) swelling in the hands, feet or stomach 4) if you have to sleep on extra pillows at night in order to breathe.   Complete by: As directed    Diet - low sodium heart healthy   Complete by: As directed    Discharge instructions   Complete by: As directed    Please call call MD or return to ER for similar or worsening recurring problem that brought you to hospital or if any fever,nausea/vomiting,abdominal pain, uncontrolled pain, chest pain,  shortness of breath or any other alarming symptoms.  you will need to follow-up with pulmonary doctor for a diaphragm problem  Please follow-up your doctor as instructed in a week time and call the office for appointment.  Please avoid alcohol, smoking, or any other illicit substance and maintain healthy habits including taking your regular medications as prescribed.  You were cared for by a hospitalist during your hospital stay. If you have any questions about your discharge medications or the care you received while you were in the hospital after you are discharged, you can call the unit and ask to speak with the hospitalist on call if the hospitalist that took care of you is not available.  Once you are discharged, your primary care physician will handle any further medical issues. Please note that NO REFILLS for any discharge medications will be authorized once you are discharged, as it is  imperative that you return to your primary care physician (or establish a relationship with a primary care physician if you do not have one) for your aftercare needs so that they can reassess your need for medications and monitor your lab values   Increase activity slowly   Complete by: As directed      Allergies as of 08/31/2020      Reactions   Fish-derived Products Anaphylaxis   Zoloft [sertraline Hcl] Rash   Sulfamethizole Rash      Medication List    STOP taking these medications   atorvastatin 80 MG tablet Commonly known as: LIPITOR   docusate sodium 100 MG capsule Commonly known as: COLACE   gabapentin 600 MG tablet Commonly known as: NEURONTIN   rasagiline 0.5 MG Tabs tablet Commonly known as: AZILECT     TAKE these medications   acetaminophen 325 MG tablet Commonly known as: TYLENOL Take 650 mg by mouth every 6 (six) hours as needed for fever. Fever > 100.4 Degrees. Do Not Exceed 3000 MG/24 Hours   albuterol 108 (90 Base) MCG/ACT inhaler Commonly known as: VENTOLIN HFA Inhale 2 puffs into the lungs every 6 (six) hours as needed for wheezing or shortness of breath. Wait 1  minute in between each puff   amoxicillin 500 MG capsule Commonly known as: AMOXIL Take 1 capsule (500 mg total) by mouth every 8 (eight) hours for 2 days.   aspirin 81 MG chewable tablet Chew 81 mg by mouth daily.   bisacodyl 5 MG EC tablet Generic drug: bisacodyl Take 5 mg by mouth daily as needed for moderate constipation.   busPIRone 10 MG tablet Commonly known as: BUSPAR Take 1 tablet (10 mg total) by mouth 3 (three) times daily.   carbidopa-levodopa 25-100 MG tablet Commonly known as: SINEMET IR Take 2.5 tablets by mouth 3 (three) times daily. Take at 0900, 1300 and 1700   carvedilol 6.25 MG tablet Commonly known as: COREG Take 3.125 mg by mouth 2 (two) times daily with a meal. Hold Dose for Systolic Pressure <100 or Pulse <50 What changed: Another medication with the same name  was removed. Continue taking this medication, and follow the directions you see here.   cholecalciferol 25 MCG (1000 UNIT) tablet Commonly known as: VITAMIN D3 Take 1,000 Units by mouth daily. What changed: Another medication with the same name was removed. Continue taking this medication, and follow the directions you see here.   cholestyramine 4 g packet Commonly known as: QUESTRAN Take 1 packet (4 g total) by mouth 3 (three) times daily for 7 days.   clopidogrel 75 MG tablet Commonly known as: PLAVIX Take 75 mg by mouth daily.   diphenoxylate-atropine 2.5-0.025 MG tablet Commonly known as: LOMOTIL Take 1 tablet by mouth 3 (three) times daily as needed for diarrhea or loose stools.   DULoxetine 30 MG capsule Commonly known as: CYMBALTA Take 30 mg by mouth 2 (two) times daily.   empagliflozin 25 MG Tabs tablet Commonly known as: JARDIANCE Take 12.5 mg by mouth daily.   EPINEPHrine 0.3 mg/0.3 mL Soaj injection Commonly known as: EPI-PEN Inject 0.3 mg into the muscle daily as needed for anaphylaxis.   famotidine 20 MG tablet Commonly known as: PEPCID Take 1 tablet (20 mg total) by mouth in the morning and at bedtime.   Fluticasone-Salmeterol 250-50 MCG/DOSE Aepb Commonly known as: ADVAIR Inhale 1 puff into the lungs 2 (two) times daily.   furosemide 20 MG tablet Commonly known as: LASIX Take 1 tablet (20 mg total) by mouth daily. May Take Extra 1/2 tablet (40 mg) to attain "Dry Weight" of 125-127 lbs What changed:   medication strength  how much to take   Magnesium Oxide 420 MG Tabs Take 1 tablet by mouth in the morning and at bedtime.   MENTHOL-METHYL SALICYLATE EX Apply 1 application topically 4 (four) times daily as needed (pain). 10-15% Topical Cream   omeprazole 20 MG capsule Commonly known as: PRILOSEC Take 20 mg by mouth daily as needed (heartburn/reflux).   potassium chloride SA 20 MEQ tablet Commonly known as: KLOR-CON Take 20 mEq by mouth 2 (two)  times daily. With food   rosuvastatin 40 MG tablet Commonly known as: CRESTOR Take 40 mg by mouth daily.   sildenafil 50 MG tablet Commonly known as: VIAGRA Take 50 mg by mouth daily as needed for erectile dysfunction. Take 1 hour prior to sexual activity*DO NOT EXCEED 1 DOSE PER 24 HOUR PERIOD*   spironolactone 25 MG tablet Commonly known as: ALDACTONE Take 25 mg by mouth daily.   tamsulosin 0.4 MG Caps capsule Commonly known as: FLOMAX Take 0.4 mg by mouth at bedtime. Hold Dose For Systolic Pressure<100   thiamine 100 MG tablet Commonly known as: Vitamin  B-1 Take 1 tablet (100 mg total) by mouth daily.   tiZANidine 4 MG tablet Commonly known as: ZANAFLEX Take 4 mg by mouth 3 (three) times daily as needed for muscle spasms. Avoid Taking if Excessive Drowsiness or Difficulty Breathing; Caution Drowsiness-Avoid Driving, Operating Machinery, Climbing   vitamin B-12 500 MCG tablet Commonly known as: CYANOCOBALAMIN Take 1,000 mcg by mouth daily.       Contact information for after-discharge care    Destination    [facility] Preferred SNF .   Service: Skilled Nursing Contact information: [address] [place] [address] [phone]                 Allergies  Allergen Reactions  . Fish-Derived Products Anaphylaxis  . Zoloft [Sertraline Hcl] Rash  . Sulfamethizole Rash    The results of significant diagnostics from this hospitalization (including imaging, microbiology, ancillary and laboratory) are listed below for reference.    Microbiology: Recent Results (from the past 240 hour(s))  Resp Panel by RT-PCR (Flu A&B, Covid) Nasopharyngeal Swab     Status: None   Collection Time: 08/26/20  5:25 PM   Specimen: Nasopharyngeal Swab; Nasopharyngeal(NP) swabs in vial transport medium  Result Value Ref Range Status   SARS Coronavirus 2 by RT PCR NEGATIVE NEGATIVE Final    Comment: (NOTE) SARS-CoV-2 target nucleic acids are NOT  DETECTED.  The SARS-CoV-2 RNA is generally detectable in upper respiratory specimens during the acute phase of infection. The lowest concentration of SARS-CoV-2 viral copies this assay can detect is 138 copies/mL. A negative result does not preclude SARS-Cov-2 infection and should not be used as the sole basis for treatment or other patient management decisions. A negative result may occur with  improper specimen collection/handling, submission of specimen other than nasopharyngeal swab, presence of viral mutation(s) within the areas targeted by this assay, and inadequate number of viral copies(<138 copies/mL). A negative result must be combined with clinical observations, patient history, and epidemiological information. The expected result is Negative.  Fact Sheet for Patients:  BloggerCourse.com  Fact Sheet for Healthcare Providers:  SeriousBroker.it  This test is no t yet approved or cleared by the [place] FDA and  has been authorized for detection and/or diagnosis of SARS-CoV-2 by FDA under an Emergency Use Authorization (EUA). This EUA will remain  in effect (meaning this test can be used) for the duration of the COVID-19 declaration under Section 564(b)(1) of the Act, 21 U.S.C.section 360bbb-3(b)(1), unless the authorization is terminated  or revoked sooner.       Influenza A by PCR NEGATIVE NEGATIVE Final   Influenza B by PCR NEGATIVE NEGATIVE Final    Comment: (NOTE) The Xpert Xpress SARS-CoV-2/FLU/RSV plus assay is intended as an aid in the diagnosis of influenza from Nasopharyngeal swab specimens and should not be used as a sole basis for treatment. Nasal washings and aspirates are unacceptable for Xpert Xpress SARS-CoV-2/FLU/RSV testing.  Fact Sheet for Patients: BloggerCourse.com  Fact Sheet for Healthcare Providers: SeriousBroker.it  This test is not yet  approved or cleared by the [place] FDA and has been authorized for detection and/or diagnosis of SARS-CoV-2 by FDA under an Emergency Use Authorization (EUA). This EUA will remain in effect (meaning this test can be used) for the duration of the COVID-19 declaration under Section 564(b)(1) of the Act, 21 U.S.C. section 360bbb-3(b)(1), unless the authorization is terminated or revoked.  Performed at [facility], [address] [place], [place] [address]  Urine culture     Status: Abnormal   Collection Time: 08/26/20  9:16 PM   Specimen: Urine, Clean Catch  Result Value Ref Range Status   Specimen Description URINE, CLEAN CATCH  Final   Special Requests   Final    ADDED 2116 08/27/2020 Performed at [facility], [address], [place], [place] [address]    Culture >=100,000 COLONIES/mL ENTEROCOCCUS FAECALIS (A)  Final   Report Status 08/30/2020 FINAL  Final   Organism ID, Bacteria ENTEROCOCCUS FAECALIS (A)  Final      Susceptibility   Enterococcus faecalis - MIC*    AMPICILLIN <=2 SENSITIVE Sensitive     NITROFURANTOIN <=16 SENSITIVE Sensitive     VANCOMYCIN 1 SENSITIVE Sensitive     * >=100,000 COLONIES/mL ENTEROCOCCUS FAECALIS  Blood culture (routine x 2)     Status: None (Preliminary result)   Collection Time: 08/26/20 11:29 PM   Specimen: BLOOD LEFT ARM  Result Value Ref Range Status   Specimen Description BLOOD LEFT ARM  Final   Special Requests   Final    BOTTLES DRAWN AEROBIC AND ANAEROBIC Blood Culture results may not be optimal due to an inadequate volume of blood received in culture bottles   Culture   Final    NO GROWTH 4 DAYS Performed at [facility], [address] [place], [place] [address]    Report Status PENDING  Incomplete  SARS CORONAVIRUS 2 (TAT 6-24 HRS) Nasopharyngeal Nasopharyngeal Swab     Status: None   Collection Time: 08/30/20 11:36 AM   Specimen: Nasopharyngeal Swab  Result Value Ref Range Status   SARS Coronavirus 2  NEGATIVE NEGATIVE Final    Comment: (NOTE) SARS-CoV-2 target nucleic acids are NOT DETECTED.  The SARS-CoV-2 RNA is generally detectable in upper and lower respiratory specimens during the acute phase of infection. Negative results do not preclude SARS-CoV-2 infection, do not rule out co-infections with other pathogens, and should not be used as the sole basis for treatment or other patient management decisions. Negative results must be combined with clinical observations, patient history, and epidemiological information. The expected result is Negative.  Fact Sheet for Patients: HairSlick.no  Fact Sheet for Healthcare Providers: quierodirigir.com  This test is not yet approved or cleared by the [place] FDA and  has been authorized for detection and/or diagnosis of SARS-CoV-2 by FDA under an Emergency Use Authorization (EUA). This EUA will remain  in effect (meaning this test can be used) for the duration of the COVID-19 declaration under Se ction 564(b)(1) of the Act, 21 U.S.C. section 360bbb-3(b)(1), unless the authorization is terminated or revoked sooner.  Performed at [facility], [address] [place], [place] [address]     Procedures/Studies: DG Chest 2 View  Result Date: 08/26/2020 CLINICAL DATA:  Shortness of breath, worse for 2 days EXAM: CHEST - 2 VIEW COMPARISON:  March 28, 2020 FINDINGS: Trachea midline. Cardiomediastinal contours and hilar structures are normal. No lobar consolidative changes. Basilar atelectasis and signs of pleural and parenchymal scarring noted in the RIGHT chest as before. LEFT chest is clear. Signs of prior rib resection involving RIGHT posterolateral rib 7. On limited assessment no acute skeletal process. IMPRESSION: Basilar atelectasis and signs of pleural and parenchymal scarring in the RIGHT chest related to postoperative changes as before. Electronically Signed   By: Donzetta Kohut M.D.   On: 08/26/2020 09:54   CT Angio Chest PE W/Cm &/Or Wo Cm  Result Date: 08/27/2020  CLINICAL DATA:  Chest pain or SOB, pleurisy or effusion suspected Shortness of breath, worse for the past 2 days. EXAM: CT ANGIOGRAPHY CHEST WITH CONTRAST TECHNIQUE: Multidetector CT imaging of the chest was performed using the standard protocol during bolus administration of intravenous contrast. Multiplanar CT image reconstructions and MIPs were obtained to evaluate the vascular anatomy. CONTRAST:  OMNIPAQUE IOHEXOL 350 MG/ML SOLN COMPARISON:  Radiograph earlier today.  Chest CTA 06/24/2019 FINDINGS: Cardiovascular: Motion artifact particularly through the lung bases. Allowing for motion artifact, no evidence of acute pulmonary embolus. Aortic atherosclerosis and tortuosity. No dissection or acute aortic findings. Common origin of the brachiocephalic and left common carotid artery, variant arch anatomy. Heart is normal in size. There are coronary artery calcifications. No pericardial effusion. Mediastinum/Nodes: Small mediastinal and hilar lymph nodes not enlarged by size criteria. Small hiatal hernia. Upper esophagus is patulous. No suspicious thyroid nodule. Lungs/Pleura: Chronic elevation of the right hemidiaphragm with postsurgical or remote posttraumatic change of right ribs and chronic basilar consolidation, hyperdensity abutting the hemidiaphragm may be secondary to prior talc pleurodesis. Findings are stable from prior exam. Chronic atelectasis in the dependent right lower lobe and right middle lobe. No evidence of pneumonia or acute airspace disease. No pleural fluid. No findings of pulmonary edema. Trachea and central bronchi are patent. Upper Abdomen: Cholecystectomy.  No acute upper abdominal findings. Musculoskeletal: Postsurgical versus remote posttraumatic change of lower right ribs. Partial ankylosis at the thoracolumbar junction, chronic. Diffuse degenerative change in the thoracic spine. Review  of the MIP images confirms the above findings. IMPRESSION: 1. No acute pulmonary embolus or acute intrathoracic abnormality. 2. Chronic elevation of the right hemidiaphragm with postsurgical or remote posttraumatic change of right ribs and chronic basilar consolidation. Chronic hyperdensity abutting the hemidiaphragm may be secondary to prior talc pleurodesis. Chronic atelectasis in the dependent right lower lobe and right middle lobe. 3. Coronary artery calcifications. Aortic Atherosclerosis (ICD10-I70.0). Electronically Signed   By: Narda Rutherford M.D.   On: 08/27/2020 00:02   MR THORACIC SPINE WO CONTRAST  Result Date: 08/30/2020 CLINICAL DATA:  Recent fall. Fecal incontinence with lower extremity numbness. EXAM: MRI THORACIC SPINE WITHOUT CONTRAST TECHNIQUE: Multiplanar, multisequence MR imaging of the thoracic spine was performed. No intravenous contrast was administered. COMPARISON:  None. FINDINGS: Alignment:  Physiologic. Vertebrae: There is edema within the proximal left ribs and the left lamina and pars interarticularis of T8 and T9. Edema in the right laminae is present to a lesser extent. No acute fracture. Cord:  Normal signal and morphology. Paraspinal and other soft tissues: Negative. Disc levels: There are small disc protrusions at all levels without spinal canal stenosis. There is posterior vertebral body fusion at T11-12. IMPRESSION: 1. Edema within the proximal left ribs and the left posterior elements of T8 and T9. This could indicate fracture or contusion. CT of the thoracic spine may be helpful further assessment. 2. Small disc protrusions at all levels without spinal canal stenosis. Electronically Signed   By: Deatra Robinson M.D.   On: 08/30/2020 22:50   MR LUMBAR SPINE WO CONTRAST  Result Date: 08/26/2020 CLINICAL DATA:  Initial evaluation for acute low back pain, cauda equina syndrome suspected. EXAM: MRI LUMBAR SPINE WITHOUT CONTRAST TECHNIQUE: Multiplanar, multisequence MR imaging  of the lumbar spine was performed. No intravenous contrast was administered. COMPARISON:  Previous MRI from 06/21/2020 FINDINGS: Segmentation:  Examination degraded by motion artifact. Standard segmentation. Lowest well-formed disc space labeled the L5-S1 level. Alignment: Mild retrolisthesis of L2 on L3 and L3  on L4, stable. Underlying dextroscoliosis. Appearance is stable from previous. Vertebrae: Vertebral body height maintained without acute or interval fracture. T11 and T12 vertebral bodies are partially ankylosed posteriorly. Bone marrow signal intensity diffusely heterogeneous but within normal limits. No worrisome osseous lesions. Discogenic reactive endplate change present throughout the lumbar spine, most pronounced at L4-5 on the right. Conus medullaris and cauda equina: Conus extends to the L1 level. Conus and cauda equina appear normal. Paraspinal and other soft tissues: Chronic postoperative changes present within the posterior paraspinous soft tissues. Approximate 1 cm simple exophytic cyst noted extending from the interpolar right kidney. Additional 4.6 cm simple cyst present at the lower pole of the right kidney. Extrarenal pelvises noted bilaterally. Visualized visceral structures otherwise unremarkable. Disc levels: T12-L1: Mild disc bulge with disc desiccation. Superimposed small right subarticular disc protrusion indents the right ventral thecal sac (series 8, image 5). Mild facet hypertrophy. No significant spinal stenosis. Foramina remain patent. L1-2: Degenerative intervertebral disc space narrowing with mild disc bulge. Discogenic reactive endplate change. Mild bilateral facet hypertrophy. No significant spinal stenosis. Foramina remain patent. L2-3: Retrolisthesis. Advanced degenerative intervertebral disc space narrowing with diffuse disc bulge, asymmetric to the left. Associated prominent reactive endplate change with marginal endplate osteophytic spurring. Prior posterior decompression.  Residual mild facet hypertrophy. No significant spinal stenosis. Mild to moderate bilateral foraminal narrowing. Appearance is stable. L3-4: Retrolisthesis. Advanced degenerative intervertebral disc space narrowing with diffuse disc bulge. Associated discogenic reactive endplate change with marginal endplate osteophytic spurring, greater on the left. Prior posterior decompression. Residual right greater than left facet hypertrophy. Stable thecal sac patency without significant spinal stenosis. Mild to moderate left greater than right L3 foraminal narrowing, stable. L4-5: Degenerative intervertebral disc space narrowing with diffuse disc bulge and disc desiccation. Associated reactive endplate changes. Superimposed small central to right subarticular disc protrusion with inferior migration (series 3, image 10), stable from previous. Protruding disc closely approximates the right greater than left descending L5 nerve roots, either which could be affected. The right L5 nerve root is slightly displaced posteriorly. Prior posterior decompression. Residual moderate facet hypertrophy. Mild to moderate bilateral lateral recess narrowing without significant spinal stenosis. Severe right with moderate left L4 foraminal narrowing. Appearance is relatively stable. L5-S1: Diffuse disc bulge with disc desiccation and intervertebral disc space narrowing. Associated reactive endplate spurring. Superimposed small central disc protrusion with slight inferior migration. Mild to moderate bilateral facet hypertrophy. Mild narrowing of the lateral recesses bilaterally. Central canal remains patent. 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 Appearance is stable. IMPRESSION: 1. No evidence for cord compression or other acute abnormality within the lumbar spine. Relatively stable appearance as compared to most recent MRI from 06/21/2020. 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 2. More moderate foraminal narrowing bilaterally at L3-4 and on the left at L4-5. 3. Central to right subarticular disc protrusion at L4-5, potentially irritating either of the descending L5 nerve roots, right greater than left. Electronically Signed   By: Rise Mu M.D.   On: 08/26/2020 22:45   DG Chest Port 1 View  Result Date: 08/28/2020 CLINICAL DATA:  Hypoxia EXAM: PORTABLE CHEST 1 VIEW COMPARISON:  August 26, 2020 FINDINGS: The cardiomediastinal silhouette is stable. No pneumothorax. Right basilar atelectasis, unchanged. Elevation right hemidiaphragm identified. Mild opacity in the left base is new in the interval, favored to represent atelectasis. Air under the right hemidiaphragm is noted to be interposed colon seen on previous CT  imaging. No other acute abnormalities. IMPRESSION: 1. New mild opacity in the left base, favored to represent atelectasis. 2. Stable right basilar atelectasis and elevation of the right hemidiaphragm. Electronically Signed   By: Gerome Samavid  Williams III M.D   On: 08/28/2020 19:17   ECHOCARDIOGRAM COMPLETE  Result Date: 08/29/2020    ECHOCARDIOGRAM REPORT   Patient Name:   Raymond KatosRICHARD E Coltrane Date of Exam: 08/29/2020 Medical Rec #:  [id number]         Height:       73.0 in Accession #:    [id number]        Weight:       227.7 lb Date of Birth:  06/10/1951         BSA:          2.274 m Patient Age:    69 years          BP:           102/59 mmHg Patient Gender: M                 HR:           64 bpm. Exam Location:  Inpatient Procedure: 2D Echo Indications:    dyspnea.  History:        Patient has prior history of Echocardiogram examinations, most                 recent 08/29/2020. CHF, Parkinson's; Risk Factors:Hypertension.  Sonographer:    Delcie RochLauren Pennington Referring Phys: [id number] DAYNA N DUNN  Sonographer Comments: Suboptimal subcostal window. IMPRESSIONS  1. Left ventricular ejection fraction, by estimation, is 55 to 60%. The left ventricle  has normal function. The left ventricle has no regional wall motion abnormalities. Left ventricular diastolic parameters are consistent with Grade I diastolic dysfunction (impaired relaxation).  2. Right ventricular systolic function is normal. The right ventricular size is normal. There is normal pulmonary artery systolic pressure.  3. The mitral valve is normal in structure. Trivial mitral valve regurgitation. No evidence of mitral stenosis.  4. The aortic valve is normal in structure. Aortic valve regurgitation is mild. No aortic stenosis is present.  5. Aortic dilatation noted. There is mild dilatation of the ascending aorta, measuring 39 mm. There is mild dilatation at the level of the sinuses of Valsalva, measuring 38 mm.  6. The inferior vena cava is normal in size with greater than 50% respiratory variability, suggesting right atrial pressure of 3 mmHg. FINDINGS  Left Ventricle: Left ventricular ejection fraction, by estimation, is 55 to 60%. The left ventricle has normal function. The left ventricle has no regional wall motion abnormalities. The left ventricular internal cavity size was normal in size. There is  no left ventricular hypertrophy. Left ventricular diastolic parameters are consistent with Grade I diastolic dysfunction (impaired relaxation). Normal left ventricular filling pressure. Right Ventricle: The right ventricular size is normal. No increase in right ventricular wall thickness. Right ventricular systolic function is normal. There is normal pulmonary artery systolic pressure. The tricuspid regurgitant velocity is 2.74 m/s, and  with an assumed right atrial pressure of 3 mmHg, the estimated right ventricular systolic pressure is 33.0 mmHg. Left Atrium: Left atrial size was normal in size. Right Atrium: Right atrial size was normal in size. Pericardium: There is no evidence of pericardial effusion. Mitral Valve: The mitral valve is normal in structure. Mild mitral annular calcification.  Trivial mitral valve regurgitation. No evidence of mitral valve stenosis. Tricuspid Valve: The tricuspid valve is normal  in structure. Tricuspid valve regurgitation is trivial. No evidence of tricuspid stenosis. Aortic Valve: The aortic valve is normal in structure. Aortic valve regurgitation is mild. No aortic stenosis is present. Pulmonic Valve: The pulmonic valve was normal in structure. Pulmonic valve regurgitation is trivial. No evidence of pulmonic stenosis. Aorta: Aortic dilatation noted. There is mild dilatation of the ascending aorta, measuring 39 mm. There is mild dilatation at the level of the sinuses of Valsalva, measuring 38 mm. Venous: The inferior vena cava is normal in size with greater than 50% respiratory variability, suggesting right atrial pressure of 3 mmHg. IAS/Shunts: No atrial level shunt detected by color flow Doppler.  LEFT VENTRICLE PLAX 2D LVIDd:         5.20 cm  Diastology LVIDs:         3.50 cm  LV e' medial:    7.07 cm/s LV PW:         1.00 cm  LV E/e' medial:  7.8 LV IVS:        1.10 cm  LV e' lateral:   10.20 cm/s LVOT diam:     2.20 cm  LV E/e' lateral: 5.4 LV SV:         65 LV SV Index:   29 LVOT Area:     3.80 cm  RIGHT VENTRICLE RV S prime:     12.20 cm/s TAPSE (M-mode): 1.9 cm LEFT ATRIUM             Index LA diam:        4.40 cm 1.94 cm/m LA Vol (A2C):   67.0 ml 29.47 ml/m LA Vol (A4C):   73.2 ml 32.20 ml/m LA Biplane Vol: 74.8 ml 32.90 ml/m  AORTIC VALVE LVOT Vmax:   78.30 cm/s LVOT Vmean:  52.300 cm/s LVOT VTI:    0.171 m  AORTA Ao Root diam: 3.80 cm Ao Asc diam:  3.90 cm MITRAL VALVE               TRICUSPID VALVE MV Area (PHT): 4.21 cm    TR Peak grad:   30.0 mmHg MV Decel Time: 180 msec    TR Vmax:        274.00 cm/s MV E velocity: 54.80 cm/s MV A velocity: 66.40 cm/s  SHUNTS MV E/A ratio:  0.83        Systemic VTI:  0.17 m                            Systemic Diam: 2.20 cm Armanda Magic MD Electronically signed by Armanda Magic MD Signature Date/Time: 08/29/2020/1:08:28  PM    Final     Labs: BNP (last 3 results) Recent Labs    03/28/20 0910 08/26/20 1657  BNP 72.0 124.4*   Basic Metabolic Panel: Recent Labs  Lab 08/26/20 0933 08/26/20 1900 08/27/20 0735 08/28/20 0302 08/29/20 0329  NA 136 136 140 139 140  K 4.9 4.3 3.6 3.5 3.3*  CL 101  --  99 101 102  CO2 18*  --  25 27 29   GLUCOSE 102*  --  95 119* 120*  BUN 12  --  14 18 17   CREATININE 0.93  --  1.07 0.95 0.84  CALCIUM 8.6*  --  8.3* 8.5* 8.5*  MG  --   --  2.1  --   --    Liver Function Tests: Recent Labs  Lab 08/27/20 0735 08/28/20 0302  AST 21 18  ALT 18 <5  ALKPHOS 84 75  BILITOT 2.5* 1.6*  PROT 5.8* 5.6*  ALBUMIN 3.2* 3.2*   No results for input(s): LIPASE, AMYLASE in the last 168 hours. No results for input(s): AMMONIA in the last 168 hours. CBC: Recent Labs  Lab 08/26/20 0933 08/26/20 1900 08/27/20 0735 08/28/20 0302 08/29/20 0329  WBC 7.7  --  6.6 5.3 5.3  NEUTROABS  --   --  5.1  --   --   HGB 15.5 13.9 14.6 13.5 13.0  HCT 46.9 41.0 41.7 39.6 39.6  MCV 101.7*  --  102.7* 101.5* 103.7*  PLT 143*  --  113* 111* 110*   Cardiac Enzymes: No results for input(s): CKTOTAL, CKMB, CKMBINDEX, TROPONINI in the last 168 hours. BNP: Invalid input(s): POCBNP CBG: No results for input(s): GLUCAP in the last 168 hours. D-Dimer No results for input(s): DDIMER in the last 72 hours. Hgb A1c No results for input(s): HGBA1C in the last 72 hours. Lipid Profile No results for input(s): CHOL, HDL, LDLCALC, TRIG, CHOLHDL, LDLDIRECT in the last 72 hours. Thyroid function studies No results for input(s): TSH, T4TOTAL, T3FREE, THYROIDAB in the last 72 hours.  Invalid input(s): FREET3 Anemia work up No results for input(s): VITAMINB12, FOLATE, FERRITIN, TIBC, IRON, RETICCTPCT in the last 72 hours. Urinalysis    Component Value Date/Time   COLORURINE YELLOW 08/26/2020 2035   APPEARANCEUR HAZY (A) 08/26/2020 2035   LABSPEC 1.009 08/26/2020 2035   PHURINE 5.0 08/26/2020  2035   GLUCOSEU NEGATIVE 08/26/2020 2035   HGBUR MODERATE (A) 08/26/2020 2035   BILIRUBINUR NEGATIVE 08/26/2020 2035   KETONESUR 20 (A) 08/26/2020 2035   PROTEINUR NEGATIVE 08/26/2020 2035   NITRITE POSITIVE (A) 08/26/2020 2035   LEUKOCYTESUR LARGE (A) 08/26/2020 2035   Sepsis Labs Invalid input(s): PROCALCITONIN,  WBC,  LACTICIDVEN Microbiology Recent Results (from the past 240 hour(s))  Resp Panel by RT-PCR (Flu A&B, Covid) Nasopharyngeal Swab     Status: None   Collection Time: 08/26/20  5:25 PM   Specimen: Nasopharyngeal Swab; Nasopharyngeal(NP) swabs in vial transport medium  Result Value Ref Range Status   SARS Coronavirus 2 by RT PCR NEGATIVE NEGATIVE Final    Comment: (NOTE) SARS-CoV-2 target nucleic acids are NOT DETECTED.  The SARS-CoV-2 RNA is generally detectable in upper respiratory specimens during the acute phase of infection. The lowest concentration of SARS-CoV-2 viral copies this assay can detect is 138 copies/mL. A negative result does not preclude SARS-Cov-2 infection and should not be used as the sole basis for treatment or other patient management decisions. A negative result may occur with  improper specimen collection/handling, submission of specimen other than nasopharyngeal swab, presence of viral mutation(s) within the areas targeted by this assay, and inadequate number of viral copies(<138 copies/mL). A negative result must be combined with clinical observations, patient history, and epidemiological information. The expected result is Negative.  Fact Sheet for Patients:  BloggerCourse.com  Fact Sheet for Healthcare Providers:  SeriousBroker.it  This test is no t yet approved or cleared by the [place] FDA and  has been authorized for detection and/or diagnosis of SARS-CoV-2 by FDA under an Emergency Use Authorization (EUA). This EUA will remain  in effect (meaning this test can be used) for  the duration of the COVID-19 declaration under Section 564(b)(1) of the Act, 21 U.S.C.section 360bbb-3(b)(1), unless the authorization is terminated  or revoked sooner.       Influenza A by PCR NEGATIVE NEGATIVE Final   Influenza B by  PCR NEGATIVE NEGATIVE Final    Comment: (NOTE) The Xpert Xpress SARS-CoV-2/FLU/RSV plus assay is intended as an aid in the diagnosis of influenza from Nasopharyngeal swab specimens and should not be used as a sole basis for treatment. Nasal washings and aspirates are unacceptable for Xpert Xpress SARS-CoV-2/FLU/RSV testing.  Fact Sheet for Patients: BloggerCourse.com  Fact Sheet for Healthcare Providers: SeriousBroker.it  This test is not yet approved or cleared by the [place] FDA and has been authorized for detection and/or diagnosis of SARS-CoV-2 by FDA under an Emergency Use Authorization (EUA). This EUA will remain in effect (meaning this test can be used) for the duration of the COVID-19 declaration under Section 564(b)(1) of the Act, 21 U.S.C. section 360bbb-3(b)(1), unless the authorization is terminated or revoked.  Performed at [facility], [address], [place], [place] [address]   Urine culture     Status: Abnormal   Collection Time: 08/26/20  9:16 PM   Specimen: Urine, Clean Catch  Result Value Ref Range Status   Specimen Description URINE, CLEAN CATCH  Final   Special Requests   Final    ADDED 2116 08/27/2020 Performed at [facility], [address] [place], [place] [address]    Culture >=100,000 COLONIES/mL ENTEROCOCCUS FAECALIS (A)  Final   Report Status 08/30/2020 FINAL  Final   Organism ID, Bacteria ENTEROCOCCUS FAECALIS (A)  Final      Susceptibility   Enterococcus faecalis - MIC*    AMPICILLIN <=2 SENSITIVE Sensitive     NITROFURANTOIN <=16 SENSITIVE Sensitive     VANCOMYCIN 1 SENSITIVE Sensitive     * >=100,000 COLONIES/mL ENTEROCOCCUS FAECALIS   Blood culture (routine x 2)     Status: None (Preliminary result)   Collection Time: 08/26/20 11:29 PM   Specimen: BLOOD LEFT ARM  Result Value Ref Range Status   Specimen Description BLOOD LEFT ARM  Final   Special Requests   Final    BOTTLES DRAWN AEROBIC AND ANAEROBIC Blood Culture results may not be optimal due to an inadequate volume of blood received in culture bottles   Culture   Final    NO GROWTH 4 DAYS Performed at [facility], [address], [place], [place] [address]    Report Status PENDING  Incomplete  SARS CORONAVIRUS 2 (TAT 6-24 HRS) Nasopharyngeal Nasopharyngeal Swab     Status: None   Collection Time: 08/30/20 11:36 AM   Specimen: Nasopharyngeal Swab  Result Value Ref Range Status   SARS Coronavirus 2 NEGATIVE NEGATIVE Final    Comment: (NOTE) SARS-CoV-2 target nucleic acids are NOT DETECTED.  The SARS-CoV-2 RNA is generally detectable in upper and lower respiratory specimens during the acute phase of infection. Negative results do not preclude SARS-CoV-2 infection, do not rule out co-infections with other pathogens, and should not be used as the sole basis for treatment or other patient management decisions. Negative results must be combined with clinical observations, patient history, and epidemiological information. The expected result is Negative.  Fact Sheet for Patients: HairSlick.no  Fact Sheet for Healthcare Providers: quierodirigir.com  This test is not yet approved or cleared by the [place] FDA and  has been authorized for detection and/or diagnosis of SARS-CoV-2 by FDA under an Emergency Use Authorization (EUA). This EUA will remain  in effect (meaning this test can be used) for the duration of the COVID-19 declaration under Se ction 564(b)(1) of the Act, 21 U.S.C. section 360bbb-3(b)(1), unless the authorization is terminated or revoked  sooner.  Performed at [facility], [address], [place], [place] [address]      Time coordinating discharge: 35 minutes  SIGNED: Lanae Boast, MD  [facility] 08/31/2020, 11:45 AM  If 7PM-7AM, please contact night-coverage [email or web address]

## 2020-08-31 NOTE — Progress Notes (Signed)
D/C instructions printed and placed in packet at nurse's station. Tele and IV removed, tolerated well.  

## 2020-08-31 NOTE — TOC Transition Note (Addendum)
Transition of Care [facility]) - CM/SW Discharge Note   Patient Details  Name: Raymond White MRN: [id number] Date of Birth: June 25, 1951  Transition of Care [facility]) CM/SW Contact:  Kermit Balo, RN Phone Number: 08/31/2020, 10:54 AM   Clinical Narrative:    Pt is discharging to [facility] today. [facility] to provide transport. Pt is aware. CM has updated the d/c RN on the unit. D/c packet at the desk.  Room: 110 Number for report: [phone]   Final next level of care: Skilled Nursing Facility Barriers to Discharge: No Barriers Identified   Patient Goals and CMS Choice   CMS Medicare.gov Compare Post Acute Care list provided to:: Patient Choice offered to / list presented to : Patient  Discharge Placement              Patient chooses bed at: [facility] Patient to be transferred to facility by: [facility] Name of family member notified: patient oriented and aware--no family Patient and family notified of of transfer: 08/31/20  Discharge Plan and Services In-house Referral: Clinical Social Work Discharge Planning Services: [employer] Consult Post Acute Care Choice: Skilled Nursing Facility                               Social Determinants of Health (SDOH) Interventions     Readmission Risk Interventions No flowsheet data found.

## 2020-09-01 LAB — CULTURE, BLOOD (ROUTINE X 2): Culture: NO GROWTH

## 2020-09-23 ENCOUNTER — Emergency Department (HOSPITAL_COMMUNITY): Payer: Medicare Other

## 2020-09-23 ENCOUNTER — Other Ambulatory Visit: Payer: Self-pay

## 2020-09-23 ENCOUNTER — Inpatient Hospital Stay (HOSPITAL_COMMUNITY)
Admission: EM | Admit: 2020-09-23 | Discharge: 2020-09-27 | DRG: 177 | Disposition: A | Discharge status: Skilled Nursing Facility | Payer: Medicare Other | Attending: Internal Medicine | Admitting: Internal Medicine

## 2020-09-23 ENCOUNTER — Encounter (HOSPITAL_COMMUNITY): Payer: Self-pay

## 2020-09-23 DIAGNOSIS — I5032 Chronic diastolic (congestive) heart failure: Secondary | ICD-10-CM | POA: Diagnosis present

## 2020-09-23 DIAGNOSIS — G20 Parkinson's disease: Secondary | ICD-10-CM | POA: Diagnosis present

## 2020-09-23 DIAGNOSIS — D696 Thrombocytopenia, unspecified: Secondary | ICD-10-CM | POA: Diagnosis present

## 2020-09-23 DIAGNOSIS — Z882 Allergy status to sulfonamides status: Secondary | ICD-10-CM | POA: Diagnosis not present

## 2020-09-23 DIAGNOSIS — W109XXA Fall (on) (from) unspecified stairs and steps, initial encounter: Secondary | ICD-10-CM | POA: Diagnosis present

## 2020-09-23 DIAGNOSIS — R0602 Shortness of breath: Secondary | ICD-10-CM

## 2020-09-23 DIAGNOSIS — E119 Type 2 diabetes mellitus without complications: Secondary | ICD-10-CM | POA: Diagnosis present

## 2020-09-23 DIAGNOSIS — F458 Other somatoform disorders: Secondary | ICD-10-CM | POA: Diagnosis present

## 2020-09-23 DIAGNOSIS — K219 Gastro-esophageal reflux disease without esophagitis: Secondary | ICD-10-CM | POA: Diagnosis present

## 2020-09-23 DIAGNOSIS — S20221A Contusion of right back wall of thorax, initial encounter: Secondary | ICD-10-CM | POA: Diagnosis present

## 2020-09-23 DIAGNOSIS — F419 Anxiety disorder, unspecified: Secondary | ICD-10-CM | POA: Diagnosis present

## 2020-09-23 DIAGNOSIS — N400 Enlarged prostate without lower urinary tract symptoms: Secondary | ICD-10-CM | POA: Diagnosis present

## 2020-09-23 DIAGNOSIS — Z9049 Acquired absence of other specified parts of digestive tract: Secondary | ICD-10-CM

## 2020-09-23 DIAGNOSIS — R0781 Pleurodynia: Secondary | ICD-10-CM

## 2020-09-23 DIAGNOSIS — Z888 Allergy status to other drugs, medicaments and biological substances status: Secondary | ICD-10-CM

## 2020-09-23 DIAGNOSIS — Y92009 Unspecified place in unspecified non-institutional (private) residence as the place of occurrence of the external cause: Secondary | ICD-10-CM | POA: Diagnosis not present

## 2020-09-23 DIAGNOSIS — Z800 Family history of malignant neoplasm of digestive organs: Secondary | ICD-10-CM

## 2020-09-23 DIAGNOSIS — Z83438 Family history of other disorder of lipoprotein metabolism and other lipidemia: Secondary | ICD-10-CM

## 2020-09-23 DIAGNOSIS — J1282 Pneumonia due to coronavirus disease 2019: Secondary | ICD-10-CM | POA: Diagnosis present

## 2020-09-23 DIAGNOSIS — G8929 Other chronic pain: Secondary | ICD-10-CM | POA: Diagnosis present

## 2020-09-23 DIAGNOSIS — Z981 Arthrodesis status: Secondary | ICD-10-CM

## 2020-09-23 DIAGNOSIS — Z6830 Body mass index (BMI) 30.0-30.9, adult: Secondary | ICD-10-CM

## 2020-09-23 DIAGNOSIS — Z91013 Allergy to seafood: Secondary | ICD-10-CM

## 2020-09-23 DIAGNOSIS — R200 Anesthesia of skin: Secondary | ICD-10-CM | POA: Diagnosis present

## 2020-09-23 DIAGNOSIS — J9691 Respiratory failure, unspecified with hypoxia: Secondary | ICD-10-CM | POA: Diagnosis present

## 2020-09-23 DIAGNOSIS — I10 Essential (primary) hypertension: Secondary | ICD-10-CM | POA: Diagnosis not present

## 2020-09-23 DIAGNOSIS — I110 Hypertensive heart disease with heart failure: Secondary | ICD-10-CM | POA: Diagnosis present

## 2020-09-23 DIAGNOSIS — W19XXXA Unspecified fall, initial encounter: Secondary | ICD-10-CM

## 2020-09-23 DIAGNOSIS — U071 COVID-19: Secondary | ICD-10-CM | POA: Diagnosis present

## 2020-09-23 DIAGNOSIS — M545 Low back pain, unspecified: Secondary | ICD-10-CM

## 2020-09-23 DIAGNOSIS — J9601 Acute respiratory failure with hypoxia: Secondary | ICD-10-CM | POA: Diagnosis present

## 2020-09-23 DIAGNOSIS — Z79899 Other long term (current) drug therapy: Secondary | ICD-10-CM

## 2020-09-23 DIAGNOSIS — Z7982 Long term (current) use of aspirin: Secondary | ICD-10-CM

## 2020-09-23 DIAGNOSIS — Z8673 Personal history of transient ischemic attack (TIA), and cerebral infarction without residual deficits: Secondary | ICD-10-CM

## 2020-09-23 DIAGNOSIS — F32A Depression, unspecified: Secondary | ICD-10-CM | POA: Diagnosis present

## 2020-09-23 DIAGNOSIS — E669 Obesity, unspecified: Secondary | ICD-10-CM | POA: Diagnosis present

## 2020-09-23 DIAGNOSIS — R001 Bradycardia, unspecified: Secondary | ICD-10-CM | POA: Diagnosis present

## 2020-09-23 DIAGNOSIS — M5125 Other intervertebral disc displacement, thoracolumbar region: Secondary | ICD-10-CM | POA: Diagnosis present

## 2020-09-23 DIAGNOSIS — T1490XA Injury, unspecified, initial encounter: Secondary | ICD-10-CM

## 2020-09-23 DIAGNOSIS — Z7902 Long term (current) use of antithrombotics/antiplatelets: Secondary | ICD-10-CM | POA: Diagnosis not present

## 2020-09-23 DIAGNOSIS — Z7951 Long term (current) use of inhaled steroids: Secondary | ICD-10-CM

## 2020-09-23 DIAGNOSIS — E785 Hyperlipidemia, unspecified: Secondary | ICD-10-CM | POA: Diagnosis present

## 2020-09-23 LAB — CBG MONITORING, ED: Glucose-Capillary: 108 mg/dL — ABNORMAL HIGH (ref 70–99)

## 2020-09-23 LAB — I-STAT CHEM 8, ED
BUN: 10 mg/dL (ref 8–23)
Calcium, Ion: 1.14 mmol/L — ABNORMAL LOW (ref 1.15–1.40)
Chloride: 105 mmol/L (ref 98–111)
Creatinine, Ser: 0.7 mg/dL (ref 0.61–1.24)
Glucose, Bld: 96 mg/dL (ref 70–99)
HCT: 40 % (ref 39.0–52.0)
Hemoglobin: 13.6 g/dL (ref 13.0–17.0)
Potassium: 4 mmol/L (ref 3.5–5.1)
Sodium: 140 mmol/L (ref 135–145)
TCO2: 23 mmol/L (ref 22–32)

## 2020-09-23 LAB — FERRITIN: Ferritin: 348 ng/mL — ABNORMAL HIGH (ref 24–336)

## 2020-09-23 LAB — CBC
HCT: 41.8 % (ref 39.0–52.0)
Hemoglobin: 14.3 g/dL (ref 13.0–17.0)
MCH: 33.7 pg (ref 26.0–34.0)
MCHC: 34.2 g/dL (ref 30.0–36.0)
MCV: 98.6 fL (ref 80.0–100.0)
Platelets: 89 10*3/uL — ABNORMAL LOW (ref 150–400)
RBC: 4.24 MIL/uL (ref 4.22–5.81)
RDW: 14 % (ref 11.5–15.5)
WBC: 2.4 10*3/uL — ABNORMAL LOW (ref 4.0–10.5)
nRBC: 0 % (ref 0.0–0.2)

## 2020-09-23 LAB — URINALYSIS, ROUTINE W REFLEX MICROSCOPIC
Bilirubin Urine: NEGATIVE
Glucose, UA: NEGATIVE mg/dL
Hgb urine dipstick: NEGATIVE
Ketones, ur: 20 mg/dL — AB
Leukocytes,Ua: NEGATIVE
Nitrite: NEGATIVE
Protein, ur: NEGATIVE mg/dL
Specific Gravity, Urine: >1.046 — ABNORMAL HIGH (ref 1.005–1.030)
pH: 5 (ref 5.0–8.0)

## 2020-09-23 LAB — SARS CORONAVIRUS 2 BY RT PCR (HOSPITAL ORDER, PERFORMED IN ~~LOC~~ HOSPITAL LAB): SARS Coronavirus 2: POSITIVE — AB

## 2020-09-23 LAB — LACTIC ACID, PLASMA: Lactic Acid, Venous: 1.1 mmol/L (ref 0.5–1.9)

## 2020-09-23 LAB — COMPREHENSIVE METABOLIC PANEL
ALT: 14 U/L (ref 0–44)
AST: 21 U/L (ref 15–41)
Albumin: 3.3 g/dL — ABNORMAL LOW (ref 3.5–5.0)
Alkaline Phosphatase: 69 U/L (ref 38–126)
Anion gap: 10 (ref 5–15)
BUN: 8 mg/dL (ref 8–23)
CO2: 22 mmol/L (ref 22–32)
Calcium: 8.5 mg/dL — ABNORMAL LOW (ref 8.9–10.3)
Chloride: 106 mmol/L (ref 98–111)
Creatinine, Ser: 0.92 mg/dL (ref 0.61–1.24)
GFR, Estimated: >60 mL/min (ref >60)
Glucose, Bld: 101 mg/dL — ABNORMAL HIGH (ref 70–99)
Potassium: 3.9 mmol/L (ref 3.5–5.1)
Sodium: 138 mmol/L (ref 135–145)
Total Bilirubin: 1.1 mg/dL (ref 0.3–1.2)
Total Protein: 6 g/dL — ABNORMAL LOW (ref 6.5–8.1)

## 2020-09-23 LAB — FIBRINOGEN: Fibrinogen: 482 mg/dL — ABNORMAL HIGH (ref 210–475)

## 2020-09-23 LAB — C-REACTIVE PROTEIN: CRP: 2.6 mg/dL — ABNORMAL HIGH (ref <1.0)

## 2020-09-23 LAB — SAMPLE TO BLOOD BANK

## 2020-09-23 LAB — D-DIMER, QUANTITATIVE: D-Dimer, Quant: 1.04 ug/mL-FEU — ABNORMAL HIGH (ref 0.00–0.50)

## 2020-09-23 LAB — HEMOGLOBIN A1C
Hgb A1c MFr Bld: 5.5 % (ref 4.8–5.6)
Mean Plasma Glucose: 111.15 mg/dL

## 2020-09-23 LAB — TROPONIN I (HIGH SENSITIVITY)
Troponin I (High Sensitivity): 10 ng/L (ref <18)
Troponin I (High Sensitivity): 10 ng/L (ref <18)

## 2020-09-23 LAB — ETHANOL: Alcohol, Ethyl (B): <10 mg/dL (ref <10)

## 2020-09-23 LAB — PROCALCITONIN: Procalcitonin: <0.1 ng/mL

## 2020-09-23 LAB — PROTIME-INR
INR: 1 (ref 0.8–1.2)
Prothrombin Time: 12.7 seconds (ref 11.4–15.2)

## 2020-09-23 LAB — BRAIN NATRIURETIC PEPTIDE: B Natriuretic Peptide: 95.2 pg/mL (ref 0.0–100.0)

## 2020-09-23 LAB — GLUCOSE, CAPILLARY: Glucose-Capillary: 142 mg/dL — ABNORMAL HIGH (ref 70–99)

## 2020-09-23 LAB — LACTATE DEHYDROGENASE: LDH: 200 U/L — ABNORMAL HIGH (ref 98–192)

## 2020-09-23 MED ORDER — DULOXETINE HCL 30 MG PO CPEP
30.0000 mg | ORAL_CAPSULE | Freq: Two times a day (BID) | ORAL | Status: DC
  Administered 2020-09-23 – 2020-09-27 (×8): 30 mg via ORAL
  Filled 2020-09-23 (×9): qty 1

## 2020-09-23 MED ORDER — ALBUTEROL SULFATE HFA 108 (90 BASE) MCG/ACT IN AERS
2.0000 | INHALATION_SPRAY | Freq: Four times a day (QID) | RESPIRATORY_TRACT | Status: DC | PRN
  Filled 2020-09-23: qty 6.7

## 2020-09-23 MED ORDER — TAMSULOSIN HCL 0.4 MG PO CAPS
0.4000 mg | ORAL_CAPSULE | Freq: Every day | ORAL | Status: DC
Start: 2020-09-23 — End: 2020-09-27
  Administered 2020-09-23 – 2020-09-26 (×4): 0.4 mg via ORAL
  Filled 2020-09-23 (×3): qty 1

## 2020-09-23 MED ORDER — ONDANSETRON HCL 4 MG PO TABS: 4.0000 mg | ORAL_TABLET | Freq: Four times a day (QID) | ORAL | Status: DC | PRN

## 2020-09-23 MED ORDER — CLOPIDOGREL BISULFATE 75 MG PO TABS
75.0000 mg | ORAL_TABLET | Freq: Every day | ORAL | Status: DC
  Administered 2020-09-24 – 2020-09-27 (×4): 75 mg via ORAL
  Filled 2020-09-23 (×5): qty 1

## 2020-09-23 MED ORDER — HEPARIN SODIUM (PORCINE) 5000 UNIT/ML IJ SOLN
5000.0000 [IU] | Freq: Three times a day (TID) | INTRAMUSCULAR | Status: DC
  Administered 2020-09-23 – 2020-09-27 (×12): 5000 [IU] via SUBCUTANEOUS
  Filled 2020-09-23 (×12): qty 1

## 2020-09-23 MED ORDER — MORPHINE SULFATE (PF) 4 MG/ML IV SOLN
4.0000 mg | Freq: Once | INTRAVENOUS | Status: AC
  Administered 2020-09-23: 4 mg via INTRAVENOUS
  Filled 2020-09-23: qty 1

## 2020-09-23 MED ORDER — CARVEDILOL 3.125 MG PO TABS
3.1250 mg | ORAL_TABLET | Freq: Two times a day (BID) | ORAL | Status: DC
  Administered 2020-09-23: 3.125 mg via ORAL
  Filled 2020-09-23: qty 1

## 2020-09-23 MED ORDER — SODIUM CHLORIDE 0.9 % IV SOLN
200.0000 mg | Freq: Once | INTRAVENOUS | Status: AC
  Administered 2020-09-23: 200 mg via INTRAVENOUS
  Filled 2020-09-23: qty 200

## 2020-09-23 MED ORDER — VITAMIN B-12 1000 MCG PO TABS
1000.0000 ug | ORAL_TABLET | Freq: Every day | ORAL | Status: DC
  Administered 2020-09-23 – 2020-09-27 (×5): 1000 ug via ORAL
  Filled 2020-09-23 (×5): qty 1

## 2020-09-23 MED ORDER — BISACODYL 5 MG PO TBEC: 5.0000 mg | DELAYED_RELEASE_TABLET | Freq: Every day | ORAL | Status: DC | PRN

## 2020-09-23 MED ORDER — LORAZEPAM 2 MG/ML IJ SOLN
0.5000 mg | Freq: Once | INTRAMUSCULAR | Status: AC
  Administered 2020-09-23: 0.5 mg via INTRAVENOUS
  Filled 2020-09-23: qty 1

## 2020-09-23 MED ORDER — MOMETASONE FURO-FORMOTEROL FUM 200-5 MCG/ACT IN AERO
2.0000 | INHALATION_SPRAY | Freq: Two times a day (BID) | RESPIRATORY_TRACT | Status: DC
  Administered 2020-09-23 – 2020-09-26 (×7): 2 via RESPIRATORY_TRACT
  Filled 2020-09-23: qty 8.8

## 2020-09-23 MED ORDER — ROSUVASTATIN CALCIUM 20 MG PO TABS
40.0000 mg | ORAL_TABLET | Freq: Every day | ORAL | Status: DC
  Administered 2020-09-24 – 2020-09-27 (×4): 40 mg via ORAL
  Filled 2020-09-23 (×6): qty 2

## 2020-09-23 MED ORDER — METHYLPREDNISOLONE SODIUM SUCC 125 MG IJ SOLR
1.0000 mg/kg | Freq: Two times a day (BID) | INTRAMUSCULAR | Status: DC
  Administered 2020-09-23 – 2020-09-24 (×2): 104.375 mg via INTRAVENOUS
  Filled 2020-09-23 (×2): qty 2

## 2020-09-23 MED ORDER — SODIUM CHLORIDE 0.9 % IV SOLN
100.0000 mg | Freq: Every day | INTRAVENOUS | Status: AC
  Administered 2020-09-24 – 2020-09-27 (×4): 100 mg via INTRAVENOUS
  Filled 2020-09-23 (×5): qty 20

## 2020-09-23 MED ORDER — PREDNISONE 5 MG PO TABS: 50.0000 mg | ORAL_TABLET | Freq: Every day | ORAL | Status: DC

## 2020-09-23 MED ORDER — CARBIDOPA-LEVODOPA 25-100 MG PO TABS
2.5000 | ORAL_TABLET | Freq: Three times a day (TID) | ORAL | Status: DC
  Administered 2020-09-23 – 2020-09-27 (×10): 2.5 via ORAL
  Filled 2020-09-23 (×13): qty 2.5

## 2020-09-23 MED ORDER — CHOLESTYRAMINE 4 G PO PACK
4.0000 g | PACK | Freq: Three times a day (TID) | ORAL | Status: DC
  Administered 2020-09-23 – 2020-09-26 (×9): 4 g via ORAL
  Filled 2020-09-23 (×15): qty 1

## 2020-09-23 MED ORDER — ENSURE ENLIVE PO LIQD
237.0000 mL | Freq: Two times a day (BID) | ORAL | Status: DC
  Administered 2020-09-24 – 2020-09-25 (×3): 237 mL via ORAL

## 2020-09-23 MED ORDER — MORPHINE SULFATE (PF) 2 MG/ML IV SOLN
2.0000 mg | Freq: Once | INTRAVENOUS | Status: AC
Start: 2020-09-23 — End: 2020-09-23
  Administered 2020-09-23: 2 mg via INTRAVENOUS
  Filled 2020-09-23: qty 1

## 2020-09-23 MED ORDER — VITAMIN D 25 MCG (1000 UNIT) PO TABS
1000.0000 [IU] | ORAL_TABLET | Freq: Every day | ORAL | Status: DC
  Administered 2020-09-23 – 2020-09-27 (×5): 1000 [IU] via ORAL
  Filled 2020-09-23 (×5): qty 1

## 2020-09-23 MED ORDER — ONDANSETRON HCL 4 MG/2ML IJ SOLN: 4.0000 mg | Freq: Four times a day (QID) | INTRAMUSCULAR | Status: DC | PRN

## 2020-09-23 MED ORDER — PANTOPRAZOLE SODIUM 40 MG PO TBEC
40.0000 mg | DELAYED_RELEASE_TABLET | Freq: Every day | ORAL | Status: DC
  Administered 2020-09-24 – 2020-09-27 (×4): 40 mg via ORAL
  Filled 2020-09-23 (×5): qty 1

## 2020-09-23 MED ORDER — THIAMINE HCL 100 MG PO TABS
100.0000 mg | ORAL_TABLET | Freq: Every day | ORAL | Status: DC
  Administered 2020-09-23 – 2020-09-27 (×5): 100 mg via ORAL
  Filled 2020-09-23 (×5): qty 1

## 2020-09-23 MED ORDER — ONDANSETRON HCL 4 MG/2ML IJ SOLN
4.0000 mg | Freq: Once | INTRAMUSCULAR | Status: AC
  Administered 2020-09-23: 4 mg via INTRAVENOUS
  Filled 2020-09-23: qty 2

## 2020-09-23 MED ORDER — ASPIRIN 81 MG PO CHEW
81.0000 mg | CHEWABLE_TABLET | Freq: Every day | ORAL | Status: DC
  Administered 2020-09-23 – 2020-09-27 (×5): 81 mg via ORAL
  Filled 2020-09-23 (×5): qty 1

## 2020-09-23 MED ORDER — BUSPIRONE HCL 5 MG PO TABS
10.0000 mg | ORAL_TABLET | Freq: Three times a day (TID) | ORAL | Status: DC
  Administered 2020-09-23 – 2020-09-27 (×12): 10 mg via ORAL
  Filled 2020-09-23 (×11): qty 2
  Filled 2020-09-23: qty 1

## 2020-09-23 MED ORDER — LORAZEPAM 2 MG/ML IJ SOLN
1.0000 mg | Freq: Once | INTRAMUSCULAR | Status: AC
  Administered 2020-09-23: 1 mg via INTRAVENOUS
  Filled 2020-09-23: qty 1

## 2020-09-23 MED ORDER — SPIRONOLACTONE 25 MG PO TABS
25.0000 mg | ORAL_TABLET | Freq: Every day | ORAL | Status: DC
  Administered 2020-09-24 – 2020-09-27 (×4): 25 mg via ORAL
  Filled 2020-09-23 (×4): qty 1

## 2020-09-23 MED ORDER — IOHEXOL 300 MG/ML  SOLN
100.0000 mL | Freq: Once | INTRAMUSCULAR | Status: AC | PRN
  Administered 2020-09-23: 100 mL via INTRAVENOUS

## 2020-09-23 MED ORDER — ACETAMINOPHEN 325 MG PO TABS: 650.0000 mg | ORAL_TABLET | Freq: Four times a day (QID) | ORAL | Status: DC | PRN

## 2020-09-23 MED ORDER — DIPHENOXYLATE-ATROPINE 2.5-0.025 MG PO TABS: 1.0000 | ORAL_TABLET | Freq: Three times a day (TID) | ORAL | Status: DC | PRN

## 2020-09-23 MED ORDER — HYDROCODONE-ACETAMINOPHEN 7.5-325 MG PO TABS
1.0000 | ORAL_TABLET | Freq: Four times a day (QID) | ORAL | Status: DC | PRN
  Administered 2020-09-23: 1 via ORAL
  Filled 2020-09-23: qty 1

## 2020-09-23 MED ORDER — INSULIN ASPART 100 UNIT/ML ~~LOC~~ SOLN
0.0000 [IU] | Freq: Three times a day (TID) | SUBCUTANEOUS | Status: DC
  Administered 2020-09-24: 1 [IU] via SUBCUTANEOUS
  Administered 2020-09-24: 3 [IU] via SUBCUTANEOUS
  Administered 2020-09-24: 2 [IU] via SUBCUTANEOUS
  Administered 2020-09-25 (×2): 1 [IU] via SUBCUTANEOUS
  Administered 2020-09-25: 2 [IU] via SUBCUTANEOUS
  Administered 2020-09-26: 1 [IU] via SUBCUTANEOUS
  Administered 2020-09-26: 2 [IU] via SUBCUTANEOUS
  Administered 2020-09-27: 1 [IU] via SUBCUTANEOUS

## 2020-09-23 NOTE — ED Provider Notes (Signed)
[facility] EMERGENCY DEPARTMENT Provider Note   CSN: [id number] Arrival date & time: 09/23/20  4098     History Chief Complaint  Patient presents with  . Fall    Raymond White is a 70 y.o. male.  70 yo M with a chief complaints of a fall down the stairs.  The patient has a history of Parkinson's disease and sometimes has foot drop when he thinks his right foot got stuck on the top of the stairs and he went down approximately 12 steps.  He thinks he might of lost consciousness.  Complaining mostly of upper back pain right-sided chest wall pain and a sensation of numbness from about his bellybutton down.  Worse on the right than the left.  Was unable to get up off the ground.  Has some mild difficulty breathing.  Denies any neck pain but when he forcefully flexes his neck he feels there is severe pain to his upper back.  Denies abdominal pain.  He is on aspirin and Plavix.  The history is provided by the patient.  Fall This is a new problem. The current episode started less than 1 hour ago. The problem occurs constantly. The problem has not changed since onset.Associated symptoms include chest pain. Pertinent negatives include no abdominal pain, no headaches and no shortness of breath. The symptoms are aggravated by bending and twisting. Nothing relieves the symptoms. He has tried nothing for the symptoms. The treatment provided no relief.       Past Medical History:  Diagnosis Date  . Acute cystitis with hematuria   . Acute midline low back pain with sciatica   . CHF (congestive heart failure) (HCC)   . Chronic heart failure with preserved ejection fraction (HCC)   . DDD (degenerative disc disease), lumbar   . Depressive disorder   . Drug induced constipation   . GERD (gastroesophageal reflux disease)   . History of TIA (transient ischemic attack)   . Hypertension   . Pancreatitis   . Parkinson's disease (HCC) 2015  . Stroke (HCC)   . Uncomplicated alcohol  dependence (HCC)   . Urinary incontinence     Patient Active Problem List   Diagnosis Date Noted  . Fall at home, initial encounter 09/23/2020  . Acute respiratory failure with hypoxemia (HCC) 08/27/2020  . Acute on chronic diastolic CHF (congestive heart failure) (HCC) 08/27/2020  . Acute CHF (congestive heart failure) (HCC) 08/27/2020  . Acute congestive heart failure (HCC) 03/29/2020  . Acute right-sided weakness 03/28/2020  . Dyspnea on exertion 03/28/2020  . Respiratory failure with hypoxia (HCC) 10/22/2018  . Acute respiratory failure with hypoxia (HCC) 10/21/2018  . Influenza A 10/21/2018  . Parkinson disease (HCC) 10/21/2018  . DDD (degenerative disc disease), lumbar 12/15/2017  . Klebsiella cystitis 12/15/2017  . 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  . Chronic heart failure with preserved ejection fraction (HCC) 12/15/2017  . GERD (gastroesophageal reflux disease) 12/15/2017  . Hypertension 12/15/2017  . Depressive disorder 12/15/2017  . Anxiety 12/15/2017    Past Surgical History:  Procedure Laterality Date  . APPENDECTOMY    . BACK SURGERY     x5  . CHOLECYSTECTOMY    . COLONOSCOPY    . ESOPHAGOGASTRODUODENOSCOPY    . KNEE ARTHROSCOPY    . neck fusion         Family History  Problem Relation Age of Onset  . Hyperlipidemia Mother   . Colon cancer Father     Social History   Tobacco  Use  . Smoking status: Never Smoker  . Smokeless tobacco: Never Used  Vaping Use  . Vaping Use: Never used  Substance Use Topics  . Alcohol use: Not Currently    Comment: Vodka  . Drug use: Never    Home Medications Prior to Admission medications   Medication Sig Start Date End Date Taking? Authorizing Provider  acetaminophen (TYLENOL) 325 MG tablet Take 650 mg by mouth every 6 (six) hours as needed for fever. Fever > 100.4 Degrees. Do Not Exceed 3000 MG/24 Hours    [provider]  albuterol (PROVENTIL HFA;VENTOLIN HFA) 108 (90 Base) MCG/ACT inhaler Inhale 2 puffs  into the lungs every 6 (six) hours as needed for wheezing or shortness of breath. Wait 1 minute in between each puff    [provider]  aspirin 81 MG chewable tablet Chew 81 mg by mouth daily.    [provider]  bisacodyl (BISACODYL) 5 MG EC tablet Take 5 mg by mouth daily as needed for moderate constipation.    [provider]  busPIRone (BUSPAR) 10 MG tablet Take 1 tablet (10 mg total) by mouth 3 (three) times daily. 08/31/20   Lanae Boast, MD  carbidopa-levodopa (SINEMET IR) 25-100 MG tablet Take 2.5 tablets by mouth 3 (three) times daily. Take at 0900, 1300 and 1700    [provider]  carvedilol (COREG) 6.25 MG tablet Take 3.125 mg by mouth 2 (two) times daily with a meal. Hold Dose for Systolic Pressure <100 or Pulse <50    [provider]  cholecalciferol (VITAMIN D3) 25 MCG (1000 UNIT) tablet Take 1,000 Units by mouth daily.    [provider]  cholestyramine (QUESTRAN) 4 g packet Take 1 packet (4 g total) by mouth 3 (three) times daily for 7 days. 08/31/20 09/07/20  Lanae Boast, MD  clopidogrel (PLAVIX) 75 MG tablet Take 75 mg by mouth daily.    [provider]  diphenoxylate-atropine (LOMOTIL) 2.5-0.025 MG tablet Take 1 tablet by mouth 3 (three) times daily as needed for diarrhea or loose stools. 08/31/20   Lanae Boast, MD  DULoxetine (CYMBALTA) 30 MG capsule Take 30 mg by mouth 2 (two) times daily.    [provider]  empagliflozin (JARDIANCE) 25 MG TABS tablet Take 12.5 mg by mouth daily.    [provider]  EPINEPHrine 0.3 mg/0.3 mL IJ SOAJ injection Inject 0.3 mg into the muscle daily as needed for anaphylaxis.    [provider]  famotidine (PEPCID) 20 MG tablet Take 1 tablet (20 mg total) by mouth in the morning and at bedtime. 08/31/20   Lanae Boast, MD  Fluticasone-Salmeterol (ADVAIR) 250-50 MCG/DOSE AEPB Inhale 1 puff into the lungs 2 (two) times daily.    [provider]  furosemide (LASIX)  20 MG tablet Take 1 tablet (20 mg total) by mouth daily. May Take Extra 1/2 tablet (40 mg)  As needed 08/31/20   Lanae Boast, MD  Magnesium Oxide 420 MG TABS Take 1 tablet by mouth in the morning and at bedtime.    [provider]  MENTHOL-METHYL SALICYLATE EX Apply 1 application topically 4 (four) times daily as needed (pain). 10-15% Topical Cream    [provider]  omeprazole (PRILOSEC) 20 MG capsule Take 20 mg by mouth daily as needed (heartburn/reflux).    [provider]  potassium chloride SA (K-DUR,KLOR-CON) 20 MEQ tablet Take 20 mEq by mouth 2 (two) times daily. With food    [provider]  rosuvastatin (  CRESTOR) 40 MG tablet Take 40 mg by mouth daily.    [provider]  sildenafil (VIAGRA) 50 MG tablet Take 50 mg by mouth daily as needed for erectile dysfunction. Take 1 hour prior to sexual activity*DO NOT EXCEED 1 DOSE PER 24 HOUR PERIOD*    [provider]  spironolactone (ALDACTONE) 25 MG tablet Take 25 mg by mouth daily.    [provider]  tamsulosin (FLOMAX) 0.4 MG CAPS capsule Take 0.4 mg by mouth at bedtime. Hold Dose For Systolic Pressure<100    [provider]  thiamine (VITAMIN B-1) 100 MG tablet Take 1 tablet (100 mg total) by mouth daily. 08/31/20   Lanae Boast, MD  tiZANidine (ZANAFLEX) 4 MG tablet Take 4 mg by mouth 3 (three) times daily as needed for muscle spasms. Avoid Taking if Excessive Drowsiness or Difficulty Breathing; Caution Drowsiness-Avoid Driving, Operating Machinery, Designer, industrial/product, Historical, MD  vitamin B-12 (CYANOCOBALAMIN) 500 MCG tablet Take 1,000 mcg by mouth daily.    [provider]    Allergies    Fish-derived products, Zoloft [sertraline hcl], and Sulfamethizole  Review of Systems   Review of Systems  Constitutional: Negative for chills and fever.  HENT: Negative for congestion and facial swelling.   Eyes: Negative for discharge and visual disturbance.   Respiratory: Negative for shortness of breath.   Cardiovascular: Positive for chest pain. Negative for palpitations.  Gastrointestinal: Negative for abdominal pain, diarrhea and vomiting.  Musculoskeletal: Positive for back pain. Negative for arthralgias and myalgias.  Skin: Negative for color change and rash.  Neurological: Positive for syncope. Negative for tremors and headaches.  Psychiatric/Behavioral: Negative for confusion and dysphoric mood.    Physical Exam Updated Vital Signs BP 106/64   Pulse (!) 57   Temp 98.4 F (36.9 C) (Oral)   Resp 20   Ht 6\' 1"  (1.854 m)   Wt 104.2 kg   SpO2 97%   BMI 30.31 kg/m   Physical Exam Vitals and nursing note reviewed.  Constitutional:      Appearance: He is well-developed and well-nourished.  HENT:     Head: Normocephalic and atraumatic.  Eyes:     Extraocular Movements: EOM normal.     Pupils: Pupils are equal, round, and reactive to light.  Neck:     Vascular: No JVD.  Cardiovascular:     Rate and Rhythm: Normal rate and regular rhythm.     Heart sounds: No murmur heard. No friction rub. No gallop.   Pulmonary:     Effort: No respiratory distress.     Breath sounds: No wheezing.  Abdominal:     General: There is no distension.     Tenderness: There is no guarding or rebound.  Musculoskeletal:        General: Tenderness present. Normal range of motion.     Cervical back: Normal range of motion and neck supple.     Comments: Abrasion to the upper back.  Pain along the mid spine worst in the thoracic but also in the upper lumbar region.  Patient has diminished rectal tone.  Subjective diminished sensation to the right lower extremity compared to the left.  Weakness on both lower extremities.  No obvious C-spine tenderness.  Pain with palpation about the right posterior rib angles about ribs 4 through 8.  Skin:    Coloration: Skin is not pale.     Findings: No rash.  Neurological:     Mental Status: He is alert and  oriented  to person, place, and time.  Psychiatric:        Mood and Affect: Mood and affect normal.        Behavior: Behavior normal.     ED Results / Procedures / Treatments   Labs (all labs ordered are listed, but only abnormal results are displayed) Labs Reviewed  SARS CORONAVIRUS 2 BY RT PCR (HOSPITAL ORDER, PERFORMED IN [facility]) - Abnormal; Notable for the following components:      Result Value   SARS Coronavirus 2 POSITIVE (*)    All other components within normal limits  COMPREHENSIVE METABOLIC PANEL - Abnormal; Notable for the following components:   Glucose, Bld 101 (*)    Calcium 8.5 (*)    Total Protein 6.0 (*)    Albumin 3.3 (*)    All other components within normal limits  CBC - Abnormal; Notable for the following components:   WBC 2.4 (*)    Platelets 89 (*)    All other components within normal limits  URINALYSIS, ROUTINE W REFLEX MICROSCOPIC - Abnormal; Notable for the following components:   Specific Gravity, Urine >1.046 (*)    Ketones, ur 20 (*)    All other components within normal limits  I-STAT CHEM 8, ED - Abnormal; Notable for the following components:   Calcium, Ion 1.14 (*)    All other components within normal limits  ETHANOL  LACTIC ACID, PLASMA  PROTIME-INR  SAMPLE TO BLOOD BANK    EKG EKG Interpretation  Date/Time:  Friday September 23 2020 07:55:03 EST Ventricular Rate:  75 PR Interval:    QRS Duration: 99 QT Interval:  397 QTC Calculation: 444 R Axis:   60 Text Interpretation: Sinus rhythm Short PR interval Since last tracing rate slower Otherwise no significant change Confirmed by Melene Plan [phone]) on 09/23/2020 8:33:29 AM   Radiology CT HEAD WO CONTRAST  Result Date: 09/23/2020 CLINICAL DATA:  Minor head trauma EXAM: CT HEAD WITHOUT CONTRAST CT CERVICAL SPINE WITHOUT CONTRAST TECHNIQUE: Multidetector CT imaging of the head and cervical spine was performed following the standard protocol without intravenous contrast.  Multiplanar CT image reconstructions of the cervical spine were also generated. COMPARISON:  None. FINDINGS: CT HEAD FINDINGS Brain: No evidence of acute infarction, hemorrhage, hydrocephalus, extra-axial collection or mass lesion/mass effect. Generalized atrophy. Vascular: No hyperdense vessel or unexpected calcification. Skull: Normal. Negative for fracture or focal lesion. Sinuses/Orbits: No acute finding. CT CERVICAL SPINE FINDINGS Alignment: Straightening of the cervical spine. Skull base and vertebrae: No acute fracture or focal bone lesion. Soft tissues and spinal canal: No prevertebral fluid or swelling. No visible canal hematoma. Disc levels: C4-5 and C5-6 solid arthrodesis. Disc degeneration is diffuse and advanced. 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 There is facet ankylosis at C2-3 on the left. Upper chest: No evidence of injury IMPRESSION: No evidence of acute intracranial or cervical spine injury. Electronically Signed   By: Marnee Spring M.D.   On: 09/23/2020 09:37   CT CERVICAL SPINE WO CONTRAST  Result Date: 09/23/2020 CLINICAL DATA:  Minor head trauma EXAM: CT HEAD WITHOUT CONTRAST CT CERVICAL SPINE WITHOUT CONTRAST TECHNIQUE: Multidetector CT imaging of the head and cervical spine was performed following the standard protocol without intravenous contrast. Multiplanar CT image reconstructions of the cervical spine were also generated. COMPARISON:  None. FINDINGS: CT HEAD FINDINGS Brain: No evidence of acute infarction, hemorrhage, hydrocephalus, extra-axial collection or mass lesion/mass effect. Generalized atrophy. Vascular: No hyperdense vessel or unexpected calcification. Skull: Normal.  Negative for fracture or focal lesion. Sinuses/Orbits: No acute finding. CT CERVICAL SPINE FINDINGS Alignment: Straightening of the cervical spine. Skull base and vertebrae: No acute fracture or focal bone lesion. Soft tissues and spinal canal: No prevertebral fluid or swelling. No visible  canal hematoma. Disc levels: C4-5 and C5-6 solid arthrodesis. Disc degeneration is diffuse and advanced. 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 There is facet ankylosis at C2-3 on the left. Upper chest: No evidence of injury IMPRESSION: No evidence of acute intracranial or cervical spine injury. Electronically Signed   By: Marnee Spring M.D.   On: 09/23/2020 09:37   MR Cervical Spine Wo Contrast  Result Date: 09/23/2020 CLINICAL DATA:  Multiple trauma.  Fell downstairs. EXAM: MRI CERVICAL, THORACIC AND LUMBAR SPINE WITHOUT CONTRAST TECHNIQUE: Multiplanar and multiecho pulse sequences of the cervical spine, to include the craniocervical junction and cervicothoracic junction, and thoracic and lumbar spine, were obtained without intravenous contrast. COMPARISON:  CT scans, same date. FINDINGS: MRI CERVICAL SPINE FINDINGS Exam is quite limited due to patient motion. Alignment: Normal Vertebrae: No acute bony findings. No definite fracture or bone lesion. Cord: Grossly normal.  No obvious cord lesion or cord hematoma. Posterior Fossa, vertebral arteries, paraspinal tissues: No significant findings. Disc levels: Multilevel disc disease and facet disease but no significant canal compromise or canal hematoma. MRI THORACIC SPINE FINDINGS Alignment: Scoliosis but normal overall alignment in the sagittal plane. Vertebrae: No acute thoracic spine fractures are identified. The facets are normally aligned. No facet fractures. Cord: No obvious cord lesions or cord hematoma. No intraspinal compressive hematoma. Paraspinal and other soft tissues: No significant findings. Disc levels: Multilevel disc disease and facet disease. Shallow multilevel disc protrusions and areas of osteophytic ridging but no significant canal compromise. MRI LUMBAR SPINE FINDINGS Segmentation: There are five lumbar type vertebral bodies. The last full intervertebral disc space is labeled L5-S1. Alignment: Degenerative lumbar spondylosis with  multilevel degenerative listhesis. Vertebrae: No acute bony findings. No fracture or worrisome bone lesion. Conus medullaris and cauda equina: Conus extends to the L1. level. Conus and cauda equina appear normal. Paraspinal and other soft tissues: No significant paraspinal or retroperitoneal findings. Right renal cyst is noted. Disc levels: T12-L1: Focal right paracentral disc protrusion with mass effect on the thecal sac potentially involving the right L1 nerve root. No spinal stenosis. L1-2: No significant findings. L2-3: Wide decompressive laminectomy. Degenerative retrolisthesis of L2 compared to L3 but no significant spinal or lateral recess stenosis. Mild foraminal narrowing bilaterally. L3-4: Wide decompressive laminectomy. No significant spinal or foraminal stenosis. L4-5: Wide decompressive laminectomy. Mild bilateral foraminal stenosis but no significant spinal stenosis. Mild right lateral recess stenosis. L5-S1: Mild bilateral foraminal stenosis but no spinal or lateral recess stenosis. Advanced facet disease. IMPRESSION: 1. Very limited MR examination of the cervical spine but no obvious cord lesion, cord hematoma or epidural hematoma. 2. Degenerative changes involving the thoracic spine but no acute fracture, cord lesion or spinal canal compromise. 3. Focal right paracentral disc protrusion at T12-L1 potentially involving the right L1 nerve root. 4. Advanced degenerative changes involving the lumbar spine but no significant canal compromise. 5. Postoperative changes involving the lumbar spine with wide decompressive laminectomies. Electronically Signed   By: Rudie Meyer M.D.   On: 09/23/2020 13:21   MR THORACIC SPINE WO CONTRAST  Result Date: 09/23/2020 CLINICAL DATA:  Multiple trauma.  Fell downstairs. EXAM: MRI CERVICAL, THORACIC AND LUMBAR SPINE WITHOUT CONTRAST TECHNIQUE: Multiplanar and multiecho pulse sequences of the cervical spine, to include the  craniocervical junction and cervicothoracic  junction, and thoracic and lumbar spine, were obtained without intravenous contrast. COMPARISON:  CT scans, same date. FINDINGS: MRI CERVICAL SPINE FINDINGS Exam is quite limited due to patient motion. Alignment: Normal Vertebrae: No acute bony findings. No definite fracture or bone lesion. Cord: Grossly normal.  No obvious cord lesion or cord hematoma. Posterior Fossa, vertebral arteries, paraspinal tissues: No significant findings. Disc levels: Multilevel disc disease and facet disease but no significant canal compromise or canal hematoma. MRI THORACIC SPINE FINDINGS Alignment: Scoliosis but normal overall alignment in the sagittal plane. Vertebrae: No acute thoracic spine fractures are identified. The facets are normally aligned. No facet fractures. Cord: No obvious cord lesions or cord hematoma. No intraspinal compressive hematoma. Paraspinal and other soft tissues: No significant findings. Disc levels: Multilevel disc disease and facet disease. Shallow multilevel disc protrusions and areas of osteophytic ridging but no significant canal compromise. MRI LUMBAR SPINE FINDINGS Segmentation: There are five lumbar type vertebral bodies. The last full intervertebral disc space is labeled L5-S1. Alignment: Degenerative lumbar spondylosis with multilevel degenerative listhesis. Vertebrae: No acute bony findings. No fracture or worrisome bone lesion. Conus medullaris and cauda equina: Conus extends to the L1. level. Conus and cauda equina appear normal. Paraspinal and other soft tissues: No significant paraspinal or retroperitoneal findings. Right renal cyst is noted. Disc levels: T12-L1: Focal right paracentral disc protrusion with mass effect on the thecal sac potentially involving the right L1 nerve root. No spinal stenosis. L1-2: No significant findings. L2-3: Wide decompressive laminectomy. Degenerative retrolisthesis of L2 compared to L3 but no significant spinal or lateral recess stenosis. Mild foraminal  narrowing bilaterally. L3-4: Wide decompressive laminectomy. No significant spinal or foraminal stenosis. L4-5: Wide decompressive laminectomy. Mild bilateral foraminal stenosis but no significant spinal stenosis. Mild right lateral recess stenosis. L5-S1: Mild bilateral foraminal stenosis but no spinal or lateral recess stenosis. Advanced facet disease. IMPRESSION: 1. Very limited MR examination of the cervical spine but no obvious cord lesion, cord hematoma or epidural hematoma. 2. Degenerative changes involving the thoracic spine but no acute fracture, cord lesion or spinal canal compromise. 3. Focal right paracentral disc protrusion at T12-L1 potentially involving the right L1 nerve root. 4. Advanced degenerative changes involving the lumbar spine but no significant canal compromise. 5. Postoperative changes involving the lumbar spine with wide decompressive laminectomies. Electronically Signed   By: Rudie Meyer M.D.   On: 09/23/2020 13:21   MR LUMBAR SPINE WO CONTRAST  Result Date: 09/23/2020 CLINICAL DATA:  Multiple trauma.  Fell downstairs. EXAM: MRI CERVICAL, THORACIC AND LUMBAR SPINE WITHOUT CONTRAST TECHNIQUE: Multiplanar and multiecho pulse sequences of the cervical spine, to include the craniocervical junction and cervicothoracic junction, and thoracic and lumbar spine, were obtained without intravenous contrast. COMPARISON:  CT scans, same date. FINDINGS: MRI CERVICAL SPINE FINDINGS Exam is quite limited due to patient motion. Alignment: Normal Vertebrae: No acute bony findings. No definite fracture or bone lesion. Cord: Grossly normal.  No obvious cord lesion or cord hematoma. Posterior Fossa, vertebral arteries, paraspinal tissues: No significant findings. Disc levels: Multilevel disc disease and facet disease but no significant canal compromise or canal hematoma. MRI THORACIC SPINE FINDINGS Alignment: Scoliosis but normal overall alignment in the sagittal plane. Vertebrae: No acute thoracic  spine fractures are identified. The facets are normally aligned. No facet fractures. Cord: No obvious cord lesions or cord hematoma. No intraspinal compressive hematoma. Paraspinal and other soft tissues: No significant findings. Disc levels: Multilevel disc disease and facet disease. Shallow multilevel  disc protrusions and areas of osteophytic ridging but no significant canal compromise. MRI LUMBAR SPINE FINDINGS Segmentation: There are five lumbar type vertebral bodies. The last full intervertebral disc space is labeled L5-S1. Alignment: Degenerative lumbar spondylosis with multilevel degenerative listhesis. Vertebrae: No acute bony findings. No fracture or worrisome bone lesion. Conus medullaris and cauda equina: Conus extends to the L1. level. Conus and cauda equina appear normal. Paraspinal and other soft tissues: No significant paraspinal or retroperitoneal findings. Right renal cyst is noted. Disc levels: T12-L1: Focal right paracentral disc protrusion with mass effect on the thecal sac potentially involving the right L1 nerve root. No spinal stenosis. L1-2: No significant findings. L2-3: Wide decompressive laminectomy. Degenerative retrolisthesis of L2 compared to L3 but no significant spinal or lateral recess stenosis. Mild foraminal narrowing bilaterally. L3-4: Wide decompressive laminectomy. No significant spinal or foraminal stenosis. L4-5: Wide decompressive laminectomy. Mild bilateral foraminal stenosis but no significant spinal stenosis. Mild right lateral recess stenosis. L5-S1: Mild bilateral foraminal stenosis but no spinal or lateral recess stenosis. Advanced facet disease. IMPRESSION: 1. Very limited MR examination of the cervical spine but no obvious cord lesion, cord hematoma or epidural hematoma. 2. Degenerative changes involving the thoracic spine but no acute fracture, cord lesion or spinal canal compromise. 3. Focal right paracentral disc protrusion at T12-L1 potentially involving the right  L1 nerve root. 4. Advanced degenerative changes involving the lumbar spine but no significant canal compromise. 5. Postoperative changes involving the lumbar spine with wide decompressive laminectomies. Electronically Signed   By: Rudie Meyer M.D.   On: 09/23/2020 13:21   DG Pelvis Portable  Result Date: 09/23/2020 CLINICAL DATA:  Fall, pelvic pain EXAM: PORTABLE PELVIS 1-2 VIEWS COMPARISON:  None. FINDINGS: There is no evidence of pelvic fracture or diastasis. No pelvic bone lesions are seen. Degenerative changes are seen within the a lumbosacral junction, not well assessed on this exam. IMPRESSION: Negative. Electronically Signed   By: Helyn Numbers MD   On: 09/23/2020 08:31   CT CHEST ABDOMEN PELVIS W CONTRAST  Result Date: 09/23/2020 CLINICAL DATA:  Fall down steps EXAM: CT CHEST, ABDOMEN, AND PELVIS WITH CONTRAST TECHNIQUE: Multidetector CT imaging of the chest, abdomen and pelvis was performed following the standard protocol during bolus administration of intravenous contrast. CONTRAST:  OMNIPAQUE IOHEXOL 300 MG/ML  SOLN COMPARISON:  August 26, 2020 FINDINGS: CT CHEST FINDINGS Cardiovascular: No evidence of acute aortic injury with limited evaluation of the aortic root secondary to cardiac motion. LEFT-sided coronary artery atherosclerotic calcifications heart is mildly enlarged. No pericardial effusion. Scattered atherosclerotic calcifications throughout the aorta. Mediastinum/Nodes: Thyroid is unremarkable. No suspicious axillary or mediastinal lymph nodes. Lungs/Pleura: No pleural effusion or pneumothorax. Revisualization of elevation of the RIGHT hemidiaphragm with chronic calcific densities and scarring overlying the RIGHT hemidiaphragm. There are new peripheral predominant ground-glass opacities throughout bilateral lungs. Musculoskeletal: Remote RIGHT-sided rib fractures. There is fat stranding overlying the RIGHT lateral soft tissues, partially excluded from the field of view. No  evidence of active contrast extravasation. Multilevel degenerative changes of the thoracic spine better assessed on contemporaneous dedicated CT. CT ABDOMEN PELVIS FINDINGS Hepatobiliary: Status post cholecystectomy. Unchanged central biliary prominence, likely due to post cholecystectomy state. No evidence of hepatic laceration. Pancreas: No peripancreatic fat stranding. Spleen: No splenic injury or perisplenic hematoma. Adrenals/Urinary Tract: Adrenal glands are unremarkable. Kidneys enhance symmetrically. No evidence of renal laceration. RIGHT-sided renal cysts. Additional subcentimeter hypodense lesions are too small to accurately characterize. Bladder is decompressed with circumferentially thickened walls; this likely  reflects a sequela of chronic outlet obstruction. Stomach/Bowel: Hiatal hernia with a LEFT-sided esophageal diverticulum, unchanged. No evidence of bowel obstruction. Scattered diverticulosis. Vascular/Lymphatic: Mild atherosclerotic calcifications of the aorta. No suspicious lymphadenopathy. 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 Other: Fat containing LEFT inguinal hernia.  No free fluid. Musculoskeletal: Multilevel degenerative changes of the lumbar spine better assessed on contemporaneous CT. IMPRESSION: 1. No evidence of solid organ or aortic injury. Fat stranding along the RIGHT lateral chest wall and flank likely reflects contusion/bruising. 2. Peripheral predominant ground-glass opacities likely reflect atypical infection such as COVID 19 infection. 3. Please see separately dictated report regarding thoracolumbar spine CT. Aortic Atherosclerosis (ICD10-I70.0). Electronically Signed   By: Meda Klinefelter MD   On: 09/23/2020 10:13   CT T-SPINE NO CHARGE  Result Date: 09/23/2020 CLINICAL DATA:  Larey Seat downstairs.  Back pain. EXAM: CT THORACIC AND LUMBAR SPINE WITHOUT CONTRAST TECHNIQUE: Multidetector CT imaging of the thoracic and lumbar spine was performed without contrast.  Multiplanar CT image reconstructions were also generated. COMPARISON:  MRI thoracic spine 08/30/2020 and MRI lumbar spine 08/26/2020. FINDINGS: CT THORACIC SPINE FINDINGS Alignment: Stable right convex thoracic scoliosis. Normal overall alignment of the thoracic vertebral bodies in the sagittal plane. Vertebrae: No acute thoracic spine fracture is identified. Stable degenerative changes. The facets are normally aligned. Paraspinal and other soft tissues: No significant paraspinal findings. Dependent bibasilar atelectasis is noted. No mediastinal mass or hematoma. No paraspinal hematoma. The visualized posterior ribs are intact. Disc levels: No large disc protrusions, significant spinal or foraminal stenosis. No intraspinal hematoma. CT LUMBAR SPINE FINDINGS Segmentation: There are five lumbar type vertebral bodies. The last full intervertebral disc space is labeled L5-S1. Alignment: Normal overall alignment. 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 Vertebrae: No acute lumbar spine fracture. Postoperative changes with wide decompressive laminectomies. No worrisome bone lesions. Paraspinal and other soft tissues: No significant paraspinal or retroperitoneal findings. Moderate aortic calcifications but no aneurysm. A right renal cyst is noted. Disc levels: Decompressive laminectomies without significant canal stenosis. No hematoma or abnormal fluid collections. The upper sacrum is intact and the SI joints are intact. IMPRESSION: 1. No acute thoracic or lumbar spine fracture is identified. 2. Stable right convex thoracic scoliosis. 3. 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 4. Postoperative changes involving the lumbar spine with wide decompressive laminectomies. Aortic Atherosclerosis (ICD10-I70.0). Electronically Signed   By: Rudie Meyer M.D.   On: 09/23/2020 10:08   CT L-SPINE NO CHARGE  Result Date: 09/23/2020 CLINICAL  DATA:  Larey Seat downstairs.  Back pain. EXAM: CT THORACIC AND LUMBAR SPINE WITHOUT CONTRAST TECHNIQUE: Multidetector CT imaging of the thoracic and lumbar spine was performed without contrast. Multiplanar CT image reconstructions were also generated. COMPARISON:  MRI thoracic spine 08/30/2020 and MRI lumbar spine 08/26/2020. FINDINGS: CT THORACIC SPINE FINDINGS Alignment: Stable right convex thoracic scoliosis. Normal overall alignment of the thoracic vertebral bodies in the sagittal plane. Vertebrae: No acute thoracic spine fracture is identified. Stable degenerative changes. The facets are normally aligned. Paraspinal and other soft tissues: No significant paraspinal findings. Dependent bibasilar atelectasis is noted. No mediastinal mass or hematoma. No paraspinal hematoma. The visualized posterior ribs are intact. Disc levels: No large disc protrusions, significant spinal or foraminal stenosis. No intraspinal hematoma. CT LUMBAR SPINE FINDINGS Segmentation: There are five lumbar type vertebral bodies. The last full intervertebral disc space is labeled L5-S1. Alignment: Normal overall alignment. Severe degenerative lumbar spondylosis with multilevel disc disease and facet  disease. Vertebrae: No acute lumbar spine fracture. Postoperative changes with wide decompressive laminectomies. No worrisome bone lesions. Paraspinal and other soft tissues: No significant paraspinal or retroperitoneal findings. Moderate aortic calcifications but no aneurysm. A right renal cyst is noted. Disc levels: Decompressive laminectomies without significant canal stenosis. No hematoma or abnormal fluid collections. The upper sacrum is intact and the SI joints are intact. IMPRESSION: 1. No acute thoracic or lumbar spine fracture is identified. 2. Stable right convex thoracic scoliosis. 3. 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 4. Postoperative changes involving the lumbar spine with wide  decompressive laminectomies. Aortic Atherosclerosis (ICD10-I70.0). Electronically Signed   By: Rudie Meyer M.D.   On: 09/23/2020 10:08   DG Chest Port 1 View  Result Date: 09/23/2020 CLINICAL DATA:  Chest pain after fall. EXAM: PORTABLE CHEST 1 VIEW COMPARISON:  August 28, 2020. FINDINGS: The heart size and mediastinal contours are within normal limits. No pneumothorax is noted. Mild bibasilar subsegmental atelectasis or infiltrates are noted with small right pleural effusion. The visualized skeletal structures are unremarkable. IMPRESSION: Mild bibasilar subsegmental atelectasis or infiltrates are noted with small right pleural effusion. Electronically Signed   By: Lupita Raider M.D.   On: 09/23/2020 08:41    Procedures Procedures  Procedure note: Ultrasound Guided Peripheral IV Ultrasound guided peripheral 1.88 inch angiocath IV placement performed by me. Indications: Nursing unable to place IV. Details: The antecubital fossa and upper arm were evaluated with a multifrequency linear probe. Patent brachial veins were noted. 1 attempt was made to cannulate a vein under realtime US guidance with successful cannulation of the vein and catheter placement. There is return of non-pulsatile dark red blood. The patient tolerated the procedure well without complications. Images archived electronically.  CPT codes: 16109 and [phone]  Medications Ordered in ED Medications  morphine 2 MG/ML injection 2 mg (2 mg Intravenous Given 09/23/20 0826)  ondansetron (ZOFRAN) injection 4 mg (4 mg Intravenous Given 09/23/20 0825)  morphine 4 MG/ML injection 4 mg (4 mg Intravenous Given 09/23/20 0919)  iohexol (OMNIPAQUE) 300 MG/ML solution 100 mL (100 mLs Intravenous Contrast Given 09/23/20 0917)  morphine 4 MG/ML injection 4 mg (4 mg Intravenous Given 09/23/20 1054)  LORazepam (ATIVAN) injection 0.5 mg (0.5 mg Intravenous Given 09/23/20 1054)  LORazepam (ATIVAN) injection 1 mg (1 mg Intravenous Given 09/23/20 1130)    ED  Course  I have reviewed the triage vital signs and the nursing notes.  Pertinent labs & imaging results that were available during my care of the patient were reviewed by me and considered in my medical decision making (see chart for details).    MDM Rules/Calculators/A&P                          71 yo M with a chief complaints of a fall down the stairs.  Sounds nonsyncopal by history.  Patient having some neurologic signs to the lower part of his body.  Will obtain trauma scans.  CT scan of the head through the pelvis read is negative for acute injury.  The patient continued to have severe pain in his thoracic spine as well as weakness to his lower extremities.  MRIs were ordered.  Will discuss with neurosurgery.  Neurosurgery eval. patient without obvious finding on MRI.  With persistent pain discussed with hospitalist who will admit.  The patients results and plan were reviewed and discussed.   Any x-rays performed were independently reviewed by  myself.   Differential diagnosis were considered with the presenting HPI.  Medications  morphine 2 MG/ML injection 2 mg (2 mg Intravenous Given 09/23/20 0826)  ondansetron (ZOFRAN) injection 4 mg (4 mg Intravenous Given 09/23/20 0825)  morphine 4 MG/ML injection 4 mg (4 mg Intravenous Given 09/23/20 0919)  iohexol (OMNIPAQUE) 300 MG/ML solution 100 mL (100 mLs Intravenous Contrast Given 09/23/20 0917)  morphine 4 MG/ML injection 4 mg (4 mg Intravenous Given 09/23/20 1054)  LORazepam (ATIVAN) injection 0.5 mg (0.5 mg Intravenous Given 09/23/20 1054)  LORazepam (ATIVAN) injection 1 mg (1 mg Intravenous Given 09/23/20 1130)    Vitals:   09/23/20 1330 09/23/20 1400 09/23/20 1430 09/23/20 1500  BP: 117/80 103/65 107/65 106/64  Pulse: 81 64 65 (!) 57  Resp: (!) 26 19 (!) 21 20  Temp:      TempSrc:      SpO2: 94% 91% 95% 97%  Weight:      Height:        Final diagnoses:  Rib pain on right side  Low back pain    Admission/ observation were  discussed with the admitting physician, patient and/or family and they are comfortable with the plan.   Final Clinical Impression(s) / ED Diagnoses Final diagnoses:  Rib pain on right side  Low back pain    Rx / DC Orders ED Discharge Orders    None       Melene PlanFloyd, Kaicee Scarpino, DO 09/23/20 1540

## 2020-09-23 NOTE — ED Notes (Signed)
Placed pt on hospital bed for comfort. 

## 2020-09-23 NOTE — ED Notes (Signed)
Attempted to give report and was told the nurse will call me back. 

## 2020-09-23 NOTE — H&P (Signed)
History and Physical    Raymond White TFT:[id number] DOB: Jan 03, 1951 DOA: 09/23/2020  PCP: Judeth Horn, PA-C    Patient coming from:  Home   Chief Complaint:  Fall   HPI: Raymond White is a 70 y.o. male seen in the emergency room today with complaints of a fall down the stairs.  Patient thinks he tripped when his foot got stuck.  Patient is unsure if he lost consciousness.  Report of back pain and right-sided chest wall pain and numbness from mid abdomen down worse on the right.  She also reports some difficulty breathing.  Patient is currently taking dual antiplatelet therapy with aspirin and Plavix.  Patient seen by neurosurgery in ED with no neurosurgical intervention, physical therapy evaluation and management, as patient has no acute fracture or subluxation or cord compression. Patient is a past medical history of a/c cystitis and hematuria/ CHF/ GERD /H/O TIA / GERD / Parkinson's disease.  ED Course:  Vitals:   09/23/20 1500 09/23/20 1530 09/23/20 1538 09/23/20 1540  BP: 106/64 120/66    Pulse: (!) 57  (!) 58   Resp: 20  (!) 22   Temp:    (!) 97.3 F (36.3 C)  TempSrc:    Oral  SpO2: 97%  95%   Weight:      Height:      In the ED patient is alert awake oriented, afebrile O2 sats above 90%, labs show normal electrolytes, CMP shows a serum calcium of 8.5 with an ionized calcium of 1.14.  CBC shows WBC of 2.4, hemoglobin of 13.6, platelet counts of 89.  Patient is also Covid positive.  Review of Systems:  Review of Systems  Constitutional: Positive for fever.  Respiratory: Positive for shortness of breath.   Cardiovascular: Positive for chest pain.  Musculoskeletal: Positive for back pain.  All other systems reviewed and are negative.    Past Medical History:  Diagnosis Date  . Acute cystitis with hematuria   . Acute midline low back pain with sciatica   . CHF (congestive heart failure) (HCC)   . Chronic heart failure with preserved ejection fraction  (HCC)   . DDD (degenerative disc disease), lumbar   . Depressive disorder   . Drug induced constipation   . GERD (gastroesophageal reflux disease)   . History of TIA (transient ischemic attack)   . Hypertension   . Pancreatitis   . Parkinson's disease (HCC) 2015  . Stroke (HCC)   . Uncomplicated alcohol dependence (HCC)   . Urinary incontinence     Past Surgical History:  Procedure Laterality Date  . APPENDECTOMY    . BACK SURGERY     x5  . CHOLECYSTECTOMY    . COLONOSCOPY    . ESOPHAGOGASTRODUODENOSCOPY    . KNEE ARTHROSCOPY    . neck fusion       reports that he has never smoked. He has never used smokeless tobacco. He reports previous alcohol use. He reports that he does not use drugs.  Allergies  Allergen Reactions  . Fish-Derived Products Anaphylaxis  . Zoloft [Sertraline Hcl] Rash  . Sulfamethizole Rash    Family History  Problem Relation Age of Onset  . Hyperlipidemia Mother   . Colon cancer Father     Prior to Admission medications   Medication Sig Start Date End Date Taking? Authorizing Provider  acetaminophen (TYLENOL) 325 MG tablet Take 650 mg by mouth every 6 (six) hours as needed for fever. Fever > 100.4 Degrees. Do  Not Exceed 3000 MG/24 Hours    [provider]  albuterol (PROVENTIL HFA;VENTOLIN HFA) 108 (90 Base) MCG/ACT inhaler Inhale 2 puffs into the lungs every 6 (six) hours as needed for wheezing or shortness of breath. Wait 1 minute in between each puff    [provider]  aspirin 81 MG chewable tablet Chew 81 mg by mouth daily.    [provider]  bisacodyl (BISACODYL) 5 MG EC tablet Take 5 mg by mouth daily as needed for moderate constipation.    [provider]  busPIRone (BUSPAR) 10 MG tablet Take 1 tablet (10 mg total) by mouth 3 (three) times daily. 08/31/20   Lanae Boast, MD  carbidopa-levodopa (SINEMET IR) 25-100 MG tablet Take 2.5 tablets by mouth 3 (three) times daily. Take at 0900, 1300 and 1700     [provider]  carvedilol (COREG) 6.25 MG tablet Take 3.125 mg by mouth 2 (two) times daily with a meal. Hold Dose for Systolic Pressure <100 or Pulse <50    [provider]  cholecalciferol (VITAMIN D3) 25 MCG (1000 UNIT) tablet Take 1,000 Units by mouth daily.    [provider]  cholestyramine (QUESTRAN) 4 g packet Take 1 packet (4 g total) by mouth 3 (three) times daily for 7 days. 08/31/20 09/07/20  Lanae Boast, MD  clopidogrel (PLAVIX) 75 MG tablet Take 75 mg by mouth daily.    [provider]  diphenoxylate-atropine (LOMOTIL) 2.5-0.025 MG tablet Take 1 tablet by mouth 3 (three) times daily as needed for diarrhea or loose stools. 08/31/20   Lanae Boast, MD  DULoxetine (CYMBALTA) 30 MG capsule Take 30 mg by mouth 2 (two) times daily.    [provider]  empagliflozin (JARDIANCE) 25 MG TABS tablet Take 12.5 mg by mouth daily.    [provider]  EPINEPHrine 0.3 mg/0.3 mL IJ SOAJ injection Inject 0.3 mg into the muscle daily as needed for anaphylaxis.    [provider]  famotidine (PEPCID) 20 MG tablet Take 1 tablet (20 mg total) by mouth in the morning and at bedtime. 08/31/20   Lanae Boast, MD  Fluticasone-Salmeterol (ADVAIR) 250-50 MCG/DOSE AEPB Inhale 1 puff into the lungs 2 (two) times daily.    [provider]  furosemide (LASIX) 20 MG tablet Take 1 tablet (20 mg total) by mouth daily. May Take Extra 1/2 tablet (40 mg)  As needed 08/31/20   Lanae Boast, MD  Magnesium Oxide 420 MG TABS Take 1 tablet by mouth in the morning and at bedtime.    [provider]  MENTHOL-METHYL SALICYLATE EX Apply 1 application topically 4 (four) times daily as needed (pain). 10-15% Topical Cream    [provider]  omeprazole (PRILOSEC) 20 MG capsule Take 20 mg by mouth daily as needed (heartburn/reflux).    [provider]  potassium chloride SA (K-DUR,KLOR-CON) 20 MEQ tablet Take 20 mEq by mouth 2 (two) times daily. With  food    [provider]  rosuvastatin (CRESTOR) 40 MG tablet Take 40 mg by mouth daily.    [provider]  sildenafil (VIAGRA) 50 MG tablet Take 50 mg by mouth daily as needed for erectile dysfunction. Take 1 hour prior to sexual activity*DO NOT EXCEED 1 DOSE PER 24 HOUR PERIOD*    [provider]  spironolactone (ALDACTONE) 25 MG tablet Take 25 mg by mouth daily.    [provider]  tamsulosin (FLOMAX) 0.4 MG CAPS capsule Take 0.4 mg by mouth at bedtime.  Hold Dose For Systolic Pressure<100    [provider]  thiamine (VITAMIN B-1) 100 MG tablet Take 1 tablet (100 mg total) by mouth daily. 08/31/20   Lanae Boast, MD  tiZANidine (ZANAFLEX) 4 MG tablet Take 4 mg by mouth 3 (three) times daily as needed for muscle spasms. Avoid Taking if Excessive Drowsiness or Difficulty Breathing; Caution Drowsiness-Avoid Driving, Operating Machinery, Designer, industrial/product, Historical, MD  vitamin B-12 (CYANOCOBALAMIN) 500 MCG tablet Take 1,000 mcg by mouth daily.    [provider]    Physical Exam: Vitals:   09/23/20 1500 09/23/20 1530 09/23/20 1538 09/23/20 1540  BP: 106/64 120/66    Pulse: (!) 57  (!) 58   Resp: 20  (!) 22   Temp:    (!) 97.3 F (36.3 C)  TempSrc:    Oral  SpO2: 97%  95%   Weight:      Height:        Physical Exam Vitals and nursing note reviewed.  Constitutional:      General: He is not in acute distress.    Appearance: He is ill-appearing.  HENT:     Head: Normocephalic and atraumatic.     Left Ear: Tympanic membrane normal.  Eyes:     Extraocular Movements: Extraocular movements intact.     Pupils: Pupils are equal, round, and reactive to light.  Cardiovascular:     Rate and Rhythm: Normal rate.     Pulses: Normal pulses.     Heart sounds: Normal heart sounds.  Pulmonary:     Effort: Pulmonary effort is normal.     Breath sounds: Normal breath sounds.  Abdominal:     General: Bowel sounds are normal. There is no  distension.     Palpations: Abdomen is soft.     Tenderness: There is no abdominal tenderness.  Musculoskeletal:     Right lower leg: No edema.     Left lower leg: No edema.  Neurological:     General: No focal deficit present.     Mental Status: He is alert and oriented to person, place, and time.  Psychiatric:        Mood and Affect: Mood normal.        Behavior: Behavior normal.      Labs on Admission: I have personally reviewed following labs and imaging studies Labs  No results for input(s): CKTOTAL, CKMB, TROPONINI in the last 72 hours. Lab Results  Component Value Date   WBC 2.4 (L) 09/23/2020   HGB 13.6 09/23/2020   HCT 40.0 09/23/2020   MCV 98.6 09/23/2020   PLT 89 (L) 09/23/2020    Recent Labs  Lab 09/23/20 0818 09/23/20 0829  NA 138 140  K 3.9 4.0  CL 106 105  CO2 22  --   BUN 8 10  CREATININE 0.92 0.70  CALCIUM 8.5*  --   PROT 6.0*  --   BILITOT 1.1  --   ALKPHOS 69  --   ALT 14  --   AST 21  --   GLUCOSE 101* 96   Lab Results  Component Value Date   CHOL 193 03/29/2020   HDL 50 03/29/2020   LDLCALC 112 (H) 03/29/2020   TRIG 156 (H) 03/29/2020   Lab Results  Component Value Date   DDIMER 0.47 05/25/2019   Invalid input(s): POCBNP  Urinalysis    Component Value Date/Time   COLORURINE YELLOW 09/23/2020 1311   APPEARANCEUR CLEAR 09/23/2020 1311  LABSPEC >1.046 (H) 09/23/2020 1311   PHURINE 5.0 09/23/2020 1311   GLUCOSEU NEGATIVE 09/23/2020 1311   HGBUR NEGATIVE 09/23/2020 1311   BILIRUBINUR NEGATIVE 09/23/2020 1311   KETONESUR 20 (A) 09/23/2020 1311   PROTEINUR NEGATIVE 09/23/2020 1311   NITRITE NEGATIVE 09/23/2020 1311   LEUKOCYTESUR NEGATIVE 09/23/2020 1311    COVID-19 Labs  No results for input(s): DDIMER, FERRITIN, LDH, CRP in the last 72 hours.  Lab Results  Component Value Date   SARSCOV2NAA POSITIVE (A) 09/23/2020   SARSCOV2NAA NEGATIVE 08/30/2020   SARSCOV2NAA NEGATIVE 08/26/2020   SARSCOV2NAA NEGATIVE 03/31/2020     Radiological Exams on Admission: CT HEAD WO CONTRAST  Result Date: 09/23/2020 CLINICAL DATA:  Minor head trauma EXAM: CT HEAD WITHOUT CONTRAST CT CERVICAL SPINE WITHOUT CONTRAST TECHNIQUE: Multidetector CT imaging of the head and cervical spine was performed following the standard protocol without intravenous contrast. Multiplanar CT image reconstructions of the cervical spine were also generated. COMPARISON:  None. FINDINGS: CT HEAD FINDINGS Brain: No evidence of acute infarction, hemorrhage, hydrocephalus, extra-axial collection or mass lesion/mass effect. Generalized atrophy. Vascular: No hyperdense vessel or unexpected calcification. Skull: Normal. Negative for fracture or focal lesion. Sinuses/Orbits: No acute finding. CT CERVICAL SPINE FINDINGS Alignment: Straightening of the cervical spine. Skull base and vertebrae: No acute fracture or focal bone lesion. Soft tissues and spinal canal: No prevertebral fluid or swelling. No visible canal hematoma. Disc levels: C4-5 and C5-6 solid arthrodesis. Disc degeneration is diffuse and advanced. 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 There is facet ankylosis at C2-3 on the left. Upper chest: No evidence of injury IMPRESSION: No evidence of acute intracranial or cervical spine injury. Electronically Signed   By: Marnee Spring M.D.   On: 09/23/2020 09:37   CT CERVICAL SPINE WO CONTRAST  Result Date: 09/23/2020 CLINICAL DATA:  Minor head trauma EXAM: CT HEAD WITHOUT CONTRAST CT CERVICAL SPINE WITHOUT CONTRAST TECHNIQUE: Multidetector CT imaging of the head and cervical spine was performed following the standard protocol without intravenous contrast. Multiplanar CT image reconstructions of the cervical spine were also generated. COMPARISON:  None. FINDINGS: CT HEAD FINDINGS Brain: No evidence of acute infarction, hemorrhage, hydrocephalus, extra-axial collection or mass lesion/mass effect. Generalized atrophy. Vascular: No hyperdense vessel or  unexpected calcification. Skull: Normal. Negative for fracture or focal lesion. Sinuses/Orbits: No acute finding. CT CERVICAL SPINE FINDINGS Alignment: Straightening of the cervical spine. Skull base and vertebrae: No acute fracture or focal bone lesion. Soft tissues and spinal canal: No prevertebral fluid or swelling. No visible canal hematoma. Disc levels: C4-5 and C5-6 solid arthrodesis. Disc degeneration is diffuse and advanced. 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 There is facet ankylosis at C2-3 on the left. Upper chest: No evidence of injury IMPRESSION: No evidence of acute intracranial or cervical spine injury. Electronically Signed   By: Marnee Spring M.D.   On: 09/23/2020 09:37   MR Cervical Spine Wo Contrast  Result Date: 09/23/2020 CLINICAL DATA:  Multiple trauma.  Fell downstairs. EXAM: MRI CERVICAL, THORACIC AND LUMBAR SPINE WITHOUT CONTRAST TECHNIQUE: Multiplanar and multiecho pulse sequences of the cervical spine, to include the craniocervical junction and cervicothoracic junction, and thoracic and lumbar spine, were obtained without intravenous contrast. COMPARISON:  CT scans, same date. FINDINGS: MRI CERVICAL SPINE FINDINGS Exam is quite limited due to patient motion. Alignment: Normal Vertebrae: No acute bony findings. No definite fracture or bone lesion. Cord: Grossly normal.  No obvious cord lesion or cord hematoma. Posterior Fossa, vertebral arteries, paraspinal tissues: No significant findings. Disc  levels: Multilevel disc disease and facet disease but no significant canal compromise or canal hematoma. MRI THORACIC SPINE FINDINGS Alignment: Scoliosis but normal overall alignment in the sagittal plane. Vertebrae: No acute thoracic spine fractures are identified. The facets are normally aligned. No facet fractures. Cord: No obvious cord lesions or cord hematoma. No intraspinal compressive hematoma. Paraspinal and other soft tissues: No significant findings. Disc levels:  Multilevel disc disease and facet disease. Shallow multilevel disc protrusions and areas of osteophytic ridging but no significant canal compromise. MRI LUMBAR SPINE FINDINGS Segmentation: There are five lumbar type vertebral bodies. The last full intervertebral disc space is labeled L5-S1. Alignment: Degenerative lumbar spondylosis with multilevel degenerative listhesis. Vertebrae: No acute bony findings. No fracture or worrisome bone lesion. Conus medullaris and cauda equina: Conus extends to the L1. level. Conus and cauda equina appear normal. Paraspinal and other soft tissues: No significant paraspinal or retroperitoneal findings. Right renal cyst is noted. Disc levels: T12-L1: Focal right paracentral disc protrusion with mass effect on the thecal sac potentially involving the right L1 nerve root. No spinal stenosis. L1-2: No significant findings. L2-3: Wide decompressive laminectomy. Degenerative retrolisthesis of L2 compared to L3 but no significant spinal or lateral recess stenosis. Mild foraminal narrowing bilaterally. L3-4: Wide decompressive laminectomy. No significant spinal or foraminal stenosis. L4-5: Wide decompressive laminectomy. Mild bilateral foraminal stenosis but no significant spinal stenosis. Mild right lateral recess stenosis. L5-S1: Mild bilateral foraminal stenosis but no spinal or lateral recess stenosis. Advanced facet disease. IMPRESSION: 1. Very limited MR examination of the cervical spine but no obvious cord lesion, cord hematoma or epidural hematoma. 2. Degenerative changes involving the thoracic spine but no acute fracture, cord lesion or spinal canal compromise. 3. Focal right paracentral disc protrusion at T12-L1 potentially involving the right L1 nerve root. 4. Advanced degenerative changes involving the lumbar spine but no significant canal compromise. 5. Postoperative changes involving the lumbar spine with wide decompressive laminectomies. Electronically Signed   By: Rudie Meyer M.D.   On: 09/23/2020 13:21   MR THORACIC SPINE WO CONTRAST  Result Date: 09/23/2020 CLINICAL DATA:  Multiple trauma.  Fell downstairs. EXAM: MRI CERVICAL, THORACIC AND LUMBAR SPINE WITHOUT CONTRAST TECHNIQUE: Multiplanar and multiecho pulse sequences of the cervical spine, to include the craniocervical junction and cervicothoracic junction, and thoracic and lumbar spine, were obtained without intravenous contrast. COMPARISON:  CT scans, same date. FINDINGS: MRI CERVICAL SPINE FINDINGS Exam is quite limited due to patient motion. Alignment: Normal Vertebrae: No acute bony findings. No definite fracture or bone lesion. Cord: Grossly normal.  No obvious cord lesion or cord hematoma. Posterior Fossa, vertebral arteries, paraspinal tissues: No significant findings. Disc levels: Multilevel disc disease and facet disease but no significant canal compromise or canal hematoma. MRI THORACIC SPINE FINDINGS Alignment: Scoliosis but normal overall alignment in the sagittal plane. Vertebrae: No acute thoracic spine fractures are identified. The facets are normally aligned. No facet fractures. Cord: No obvious cord lesions or cord hematoma. No intraspinal compressive hematoma. Paraspinal and other soft tissues: No significant findings. Disc levels: Multilevel disc disease and facet disease. Shallow multilevel disc protrusions and areas of osteophytic ridging but no significant canal compromise. MRI LUMBAR SPINE FINDINGS Segmentation: There are five lumbar type vertebral bodies. The last full intervertebral disc space is labeled L5-S1. Alignment: Degenerative lumbar spondylosis with multilevel degenerative listhesis. Vertebrae: No acute bony findings. No fracture or worrisome bone lesion. Conus medullaris and cauda equina: Conus extends to the L1. level. Conus and cauda equina appear normal.  Paraspinal and other soft tissues: No significant paraspinal or retroperitoneal findings. Right renal cyst is noted. Disc  levels: T12-L1: Focal right paracentral disc protrusion with mass effect on the thecal sac potentially involving the right L1 nerve root. No spinal stenosis. L1-2: No significant findings. L2-3: Wide decompressive laminectomy. Degenerative retrolisthesis of L2 compared to L3 but no significant spinal or lateral recess stenosis. Mild foraminal narrowing bilaterally. L3-4: Wide decompressive laminectomy. No significant spinal or foraminal stenosis. L4-5: Wide decompressive laminectomy. Mild bilateral foraminal stenosis but no significant spinal stenosis. Mild right lateral recess stenosis. L5-S1: Mild bilateral foraminal stenosis but no spinal or lateral recess stenosis. Advanced facet disease. IMPRESSION: 1. Very limited MR examination of the cervical spine but no obvious cord lesion, cord hematoma or epidural hematoma. 2. Degenerative changes involving the thoracic spine but no acute fracture, cord lesion or spinal canal compromise. 3. Focal right paracentral disc protrusion at T12-L1 potentially involving the right L1 nerve root. 4. Advanced degenerative changes involving the lumbar spine but no significant canal compromise. 5. Postoperative changes involving the lumbar spine with wide decompressive laminectomies. Electronically Signed   By: Rudie Meyer M.D.   On: 09/23/2020 13:21   MR LUMBAR SPINE WO CONTRAST  Result Date: 09/23/2020 CLINICAL DATA:  Multiple trauma.  Fell downstairs. EXAM: MRI CERVICAL, THORACIC AND LUMBAR SPINE WITHOUT CONTRAST TECHNIQUE: Multiplanar and multiecho pulse sequences of the cervical spine, to include the craniocervical junction and cervicothoracic junction, and thoracic and lumbar spine, were obtained without intravenous contrast. COMPARISON:  CT scans, same date. FINDINGS: MRI CERVICAL SPINE FINDINGS Exam is quite limited due to patient motion. Alignment: Normal Vertebrae: No acute bony findings. No definite fracture or bone lesion. Cord: Grossly normal.  No obvious cord lesion  or cord hematoma. Posterior Fossa, vertebral arteries, paraspinal tissues: No significant findings. Disc levels: Multilevel disc disease and facet disease but no significant canal compromise or canal hematoma. MRI THORACIC SPINE FINDINGS Alignment: Scoliosis but normal overall alignment in the sagittal plane. Vertebrae: No acute thoracic spine fractures are identified. The facets are normally aligned. No facet fractures. Cord: No obvious cord lesions or cord hematoma. No intraspinal compressive hematoma. Paraspinal and other soft tissues: No significant findings. Disc levels: Multilevel disc disease and facet disease. Shallow multilevel disc protrusions and areas of osteophytic ridging but no significant canal compromise. MRI LUMBAR SPINE FINDINGS Segmentation: There are five lumbar type vertebral bodies. The last full intervertebral disc space is labeled L5-S1. Alignment: Degenerative lumbar spondylosis with multilevel degenerative listhesis. Vertebrae: No acute bony findings. No fracture or worrisome bone lesion. Conus medullaris and cauda equina: Conus extends to the L1. level. Conus and cauda equina appear normal. Paraspinal and other soft tissues: No significant paraspinal or retroperitoneal findings. Right renal cyst is noted. Disc levels: T12-L1: Focal right paracentral disc protrusion with mass effect on the thecal sac potentially involving the right L1 nerve root. No spinal stenosis. L1-2: No significant findings. L2-3: Wide decompressive laminectomy. Degenerative retrolisthesis of L2 compared to L3 but no significant spinal or lateral recess stenosis. Mild foraminal narrowing bilaterally. L3-4: Wide decompressive laminectomy. No significant spinal or foraminal stenosis. L4-5: Wide decompressive laminectomy. Mild bilateral foraminal stenosis but no significant spinal stenosis. Mild right lateral recess stenosis. L5-S1: Mild bilateral foraminal stenosis but no spinal or lateral recess stenosis. Advanced  facet disease. IMPRESSION: 1. Very limited MR examination of the cervical spine but no obvious cord lesion, cord hematoma or epidural hematoma. 2. Degenerative changes involving the thoracic spine but no acute fracture, cord  lesion or spinal canal compromise. 3. Focal right paracentral disc protrusion at T12-L1 potentially involving the right L1 nerve root. 4. Advanced degenerative changes involving the lumbar spine but no significant canal compromise. 5. Postoperative changes involving the lumbar spine with wide decompressive laminectomies. Electronically Signed   By: Rudie Meyer M.D.   On: 09/23/2020 13:21   DG Pelvis Portable  Result Date: 09/23/2020 CLINICAL DATA:  Fall, pelvic pain EXAM: PORTABLE PELVIS 1-2 VIEWS COMPARISON:  None. FINDINGS: There is no evidence of pelvic fracture or diastasis. No pelvic bone lesions are seen. Degenerative changes are seen within the a lumbosacral junction, not well assessed on this exam. IMPRESSION: Negative. Electronically Signed   By: Helyn Numbers MD   On: 09/23/2020 08:31   CT CHEST ABDOMEN PELVIS W CONTRAST  Result Date: 09/23/2020 CLINICAL DATA:  Fall down steps EXAM: CT CHEST, ABDOMEN, AND PELVIS WITH CONTRAST TECHNIQUE: Multidetector CT imaging of the chest, abdomen and pelvis was performed following the standard protocol during bolus administration of intravenous contrast. CONTRAST:  OMNIPAQUE IOHEXOL 300 MG/ML  SOLN COMPARISON:  August 26, 2020 FINDINGS: CT CHEST FINDINGS Cardiovascular: No evidence of acute aortic injury with limited evaluation of the aortic root secondary to cardiac motion. LEFT-sided coronary artery atherosclerotic calcifications heart is mildly enlarged. No pericardial effusion. Scattered atherosclerotic calcifications throughout the aorta. Mediastinum/Nodes: Thyroid is unremarkable. No suspicious axillary or mediastinal lymph nodes. Lungs/Pleura: No pleural effusion or pneumothorax. Revisualization of elevation of the RIGHT  hemidiaphragm with chronic calcific densities and scarring overlying the RIGHT hemidiaphragm. There are new peripheral predominant ground-glass opacities throughout bilateral lungs. Musculoskeletal: Remote RIGHT-sided rib fractures. There is fat stranding overlying the RIGHT lateral soft tissues, partially excluded from the field of view. No evidence of active contrast extravasation. Multilevel degenerative changes of the thoracic spine better assessed on contemporaneous dedicated CT. CT ABDOMEN PELVIS FINDINGS Hepatobiliary: Status post cholecystectomy. Unchanged central biliary prominence, likely due to post cholecystectomy state. No evidence of hepatic laceration. Pancreas: No peripancreatic fat stranding. Spleen: No splenic injury or perisplenic hematoma. Adrenals/Urinary Tract: Adrenal glands are unremarkable. Kidneys enhance symmetrically. No evidence of renal laceration. RIGHT-sided renal cysts. Additional subcentimeter hypodense lesions are too small to accurately characterize. Bladder is decompressed with circumferentially thickened walls; this likely reflects a sequela of chronic outlet obstruction. Stomach/Bowel: Hiatal hernia with a LEFT-sided esophageal diverticulum, unchanged. No evidence of bowel obstruction. Scattered diverticulosis. Vascular/Lymphatic: Mild atherosclerotic calcifications of the aorta. No suspicious lymphadenopathy. 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 Other: Fat containing LEFT inguinal hernia.  No free fluid. Musculoskeletal: Multilevel degenerative changes of the lumbar spine better assessed on contemporaneous CT. IMPRESSION: 1. No evidence of solid organ or aortic injury. Fat stranding along the RIGHT lateral chest wall and flank likely reflects contusion/bruising. 2. Peripheral predominant ground-glass opacities likely reflect atypical infection such as COVID 19 infection. 3. Please see separately dictated report regarding thoracolumbar spine CT. Aortic  Atherosclerosis (ICD10-I70.0). Electronically Signed   By: Meda Klinefelter MD   On: 09/23/2020 10:13   CT T-SPINE NO CHARGE  Result Date: 09/23/2020 CLINICAL DATA:  Larey Seat downstairs.  Back pain. EXAM: CT THORACIC AND LUMBAR SPINE WITHOUT CONTRAST TECHNIQUE: Multidetector CT imaging of the thoracic and lumbar spine was performed without contrast. Multiplanar CT image reconstructions were also generated. COMPARISON:  MRI thoracic spine 08/30/2020 and MRI lumbar spine 08/26/2020. FINDINGS: CT THORACIC SPINE FINDINGS Alignment: Stable right convex thoracic scoliosis. Normal overall alignment of the thoracic vertebral bodies in the sagittal plane. Vertebrae: No acute  thoracic spine fracture is identified. Stable degenerative changes. The facets are normally aligned. Paraspinal and other soft tissues: No significant paraspinal findings. Dependent bibasilar atelectasis is noted. No mediastinal mass or hematoma. No paraspinal hematoma. The visualized posterior ribs are intact. Disc levels: No large disc protrusions, significant spinal or foraminal stenosis. No intraspinal hematoma. CT LUMBAR SPINE FINDINGS Segmentation: There are five lumbar type vertebral bodies. The last full intervertebral disc space is labeled L5-S1. Alignment: Normal overall alignment. 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 Vertebrae: No acute lumbar spine fracture. Postoperative changes with wide decompressive laminectomies. No worrisome bone lesions. Paraspinal and other soft tissues: No significant paraspinal or retroperitoneal findings. Moderate aortic calcifications but no aneurysm. A right renal cyst is noted. Disc levels: Decompressive laminectomies without significant canal stenosis. No hematoma or abnormal fluid collections. The upper sacrum is intact and the SI joints are intact. IMPRESSION: 1. No acute thoracic or lumbar spine fracture is identified. 2. Stable right convex thoracic  scoliosis. 3. 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 4. Postoperative changes involving the lumbar spine with wide decompressive laminectomies. Aortic Atherosclerosis (ICD10-I70.0). Electronically Signed   By: Rudie Meyer M.D.   On: 09/23/2020 10:08   CT L-SPINE NO CHARGE  Result Date: 09/23/2020 CLINICAL DATA:  Larey Seat downstairs.  Back pain. EXAM: CT THORACIC AND LUMBAR SPINE WITHOUT CONTRAST TECHNIQUE: Multidetector CT imaging of the thoracic and lumbar spine was performed without contrast. Multiplanar CT image reconstructions were also generated. COMPARISON:  MRI thoracic spine 08/30/2020 and MRI lumbar spine 08/26/2020. FINDINGS: CT THORACIC SPINE FINDINGS Alignment: Stable right convex thoracic scoliosis. Normal overall alignment of the thoracic vertebral bodies in the sagittal plane. Vertebrae: No acute thoracic spine fracture is identified. Stable degenerative changes. The facets are normally aligned. Paraspinal and other soft tissues: No significant paraspinal findings. Dependent bibasilar atelectasis is noted. No mediastinal mass or hematoma. No paraspinal hematoma. The visualized posterior ribs are intact. Disc levels: No large disc protrusions, significant spinal or foraminal stenosis. No intraspinal hematoma. CT LUMBAR SPINE FINDINGS Segmentation: There are five lumbar type vertebral bodies. The last full intervertebral disc space is labeled L5-S1. Alignment: Normal overall alignment. 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 Vertebrae: No acute lumbar spine fracture. Postoperative changes with wide decompressive laminectomies. No worrisome bone lesions. Paraspinal and other soft tissues: No significant paraspinal or retroperitoneal findings. Moderate aortic calcifications but no aneurysm. A right renal cyst is noted. Disc levels: Decompressive laminectomies without significant canal stenosis. No hematoma or  abnormal fluid collections. The upper sacrum is intact and the SI joints are intact. IMPRESSION: 1. No acute thoracic or lumbar spine fracture is identified. 2. Stable right convex thoracic scoliosis. 3. 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 4. Postoperative changes involving the lumbar spine with wide decompressive laminectomies. Aortic Atherosclerosis (ICD10-I70.0). Electronically Signed   By: Rudie Meyer M.D.   On: 09/23/2020 10:08   DG Chest Port 1 View  Result Date: 09/23/2020 CLINICAL DATA:  Chest pain after fall. EXAM: PORTABLE CHEST 1 VIEW COMPARISON:  August 28, 2020. FINDINGS: The heart size and mediastinal contours are within normal limits. No pneumothorax is noted. Mild bibasilar subsegmental atelectasis or infiltrates are noted with small right pleural effusion. The visualized skeletal structures are unremarkable. IMPRESSION: Mild bibasilar subsegmental atelectasis or infiltrates are noted with small right pleural effusion. Electronically Signed   By: Lupita Raider M.D.   On: 09/23/2020  08:41    EKG: Independently reviewed.  SR 75 poor tracing    Assessment/Plan Principal Problem:   Fall at home, initial encounter Active Problems:   Chronic heart failure with preserved ejection fraction (HCC)   Hypertension   Respiratory failure with hypoxia (HCC)   Acute respiratory failure with hypoxemia (HCC)   Fall/Back pain: Pt had a fall at home first time from up the stairs. He does not recall loc and agrees to be admitted. He lives alone and is very hard to take care for himself. Fall precautions, PT therapy.  Pain control with as needed medications. Per MRI report patient has moderate to severe degenerative changes including T-spine, no fracture or cord lesion or spinal canal compromise and lumbar spine, and thoracic spine, and and cervical spine.  C/h CHF : Continue patient on his Coreg 3.125 twice daily, Plavix 75. Patient also  continued on his statin therapy and aspirin 81.  Hypertension: Blood pressures well controlled, continue patient on his Coreg. Cardiac diet.  Respiratory failure with hypoxia due to COVID-19 Patient is currently on 3 L nasal cannula with oxygen saturation of 94%.  Attribute to COVID-19 infection, patient has infiltrates noted on x-ray. We will start patient on antiviral regimen with remdesivir and steroid therapy.  MDIs. Also states patient in Covid isolation.        DVT prophylaxis:  SCDs  Code Status:  Full code  Family Communication:  Cecille PoHubbard, Jean (Other)  [phone] [facility](Home Phone)  Disposition Plan:  To be determined  Consults called:  Neurosurgery per EDMD please see note  Admission status: Inpatient   Gertha CalkinEkta V Madia Carvell MD [facility] [phone] How to contact the [facility] Attending or Consulting provider 7A - 7P or covering provider during after hours 7P -7A, for this patient?    1. Check the care team in [facility] and look for a) attending/consulting [facility] provider listed and b) the [facility] team listed 2. Log into [email or web address] and use [place]'s universal password to access. If you do not have the password, please contact the hospital operator. 3. Locate the [facility] provider you are looking for under [facility] and page to a number that you can be directly reached. 4. If you still have difficulty reaching the provider, please page the [facility] (Director on Call) for the Hospitalists listed on amion for assistance. [email or web address] Password [email or web address] 09/23/2020, 4:11 PM

## 2020-09-23 NOTE — ED Triage Notes (Addendum)
Pt arrived via [facility]. Pt fell down 12 stairs. Pt states his foot got caught on top step and that is the reason he fell. Per EMS pt c/o pain and tenderness in T3-T5. Per EMS pt c/o right hip and right shoulder pain. Pt has 1+ pedal pulses bilat, pt has cap refill less than 3 sec, pt able to wiggle toes. Pt states right leg has diminished sensation. Pt has large reddened area on upper left back. Pt has large hematoma on left outer leg and an abrasion. Left anterior forearm has rug burn. Pt take plavix. Pt is A&Ox4.

## 2020-09-23 NOTE — Consult Note (Signed)
   Providing Compassionate, Quality Care - Together  Neurosurgery Consult  Referring physician: Dr. Adela Lank Reason for referral: Bilateral lower extremity weakness status post fall downstairs  Chief Complaint: Fall downstairs  History of Present Illness: This is a 70 year old male with a history of Parkinson's disease, multiple spine surgeries that tripped and fell down approximately 12 stairs.  Unsure of LOC.  Complaining of upper mid back pain, right-sided chest wall pain and numbness from his bellybutton into both legs.  He also complains of some lower extremity weakness bilaterally.  He has severe pain whenever he flexes his neck or picks up his legs.  He does take aspirin and Plavix.  He is unsure exactly how he fell but it was likely mechanical.   Medications: I have reviewed the patient's current medications. Allergies: No Known Allergies  History reviewed. No pertinent family history. Social History:  has no history on file for tobacco use, alcohol use, and drug use.  ROS: 14 point review of systems was obtained which all pertinent positives and negatives are listed in HPI above  Physical Exam:  Vital signs in last 24 hours: Temp:  [98 F (36.7 C)-98.3 F (36.8 C)] 98 F (36.7 C) (07/25 1814) Pulse Rate:  [58-128] 65 (07/26 0746) Resp:  [11-18] 14 (07/26 0217) BP: (138-182)/(65-125) 153/88 (07/26 0700) SpO2:  [91 %-98 %] 96 % (07/26 0746) PE: Patient seen and examined in ED A&O x3 PERRLA, EOMI Face symmetric Speech fluent and appropriate Bilateral upper extremities 5/5 strength Bilateral lower extremities 3/5 strength in HF/KE/KF, 4/5 in DF/PF/EHL Subjective decreased to light touch in bilateral lower extremities Deep tendon reflexes 1/4 patellar, no clonus No Hoffmann's bilaterally   Imaging: CT brain, cervical, thoracic, lumbar spine reviewed.  There is no acute fracture or subluxation.  He has advanced degenerative disease throughout his entire spine and has  significant ankylosis throughout as well as has a history of multiple cervical, thoracic and lumbar surgeries.  CT brain shows no acute hemorrhage or stroke.  MRI was performed of the cervical, thoracic and lumbar spines.  There is no acute fracture or subluxation noted.  There is no hematoma epidural or subdural in the spine noted.  The cervical spine is somewhat degraded by motion however there is no significant stenosis or cord compression.  The MRI of the thoracic spine shows some STIR signal changes bilaterally in the facet joints from T6-T10 however there is no STIR signal noted in the posterior ligamentous complex or anteriorly.  The lumbar spine show significant degenerative disease as well however there is no significant central canal stenosis.  Impression/Assessment:  70 year old male with  1.  Mechanical fall downstairs 2.  Musculoskeletal strain  Plan:  -No neurosurgical intervention recommended.  I recommend he undergo physical therapy evaluation and pain control.  There is no acute fracture or subluxation or cord compression to indicate his weakness.  I believe his weakness is somewhat pain limited due to his strain of his back. -He could have a QuickDraw TLSO for pain control if needed.  However given his significant ankylosis and risk of fall I believe this may put him at a further risk of fall. -We will sign off at this time, please call with any questions or concerns   Thank you for allowing me to participate in this patient's care.  Please do not hesitate to call with questions or concerns.   Monia Pouch, DO Neurosurgeon [facility] Cell: [phone]

## 2020-09-23 NOTE — Progress Notes (Signed)
Orthopedic Tech Progress Note Patient Details:  Raymond White 10/16/1950 [id number] Level 2 trauma Patient ID: Raymond White, male   DOB: 1951/04/01, 70 y.o.   MRN: [id number]   Raymond White 09/23/2020, 8:12 AM

## 2020-09-23 NOTE — ED Notes (Signed)
Patient transported to MRI 

## 2020-09-23 NOTE — ED Notes (Signed)
Placed pt on 2L 02 per [place]. 

## 2020-09-23 NOTE — ED Notes (Signed)
Patient transported to CT 

## 2020-09-24 ENCOUNTER — Inpatient Hospital Stay (HOSPITAL_COMMUNITY): Payer: Medicare Other

## 2020-09-24 DIAGNOSIS — J9601 Acute respiratory failure with hypoxia: Secondary | ICD-10-CM | POA: Diagnosis not present

## 2020-09-24 DIAGNOSIS — W19XXXA Unspecified fall, initial encounter: Secondary | ICD-10-CM

## 2020-09-24 DIAGNOSIS — M545 Low back pain, unspecified: Secondary | ICD-10-CM

## 2020-09-24 DIAGNOSIS — I5032 Chronic diastolic (congestive) heart failure: Secondary | ICD-10-CM | POA: Diagnosis not present

## 2020-09-24 DIAGNOSIS — Y92009 Unspecified place in unspecified non-institutional (private) residence as the place of occurrence of the external cause: Secondary | ICD-10-CM

## 2020-09-24 LAB — COMPREHENSIVE METABOLIC PANEL
ALT: 6 U/L (ref 0–44)
AST: 19 U/L (ref 15–41)
Albumin: 2.8 g/dL — ABNORMAL LOW (ref 3.5–5.0)
Alkaline Phosphatase: 65 U/L (ref 38–126)
Anion gap: 11 (ref 5–15)
BUN: 12 mg/dL (ref 8–23)
CO2: 19 mmol/L — ABNORMAL LOW (ref 22–32)
Calcium: 8.2 mg/dL — ABNORMAL LOW (ref 8.9–10.3)
Chloride: 110 mmol/L (ref 98–111)
Creatinine, Ser: 0.74 mg/dL (ref 0.61–1.24)
GFR, Estimated: >60 mL/min (ref >60)
Glucose, Bld: 146 mg/dL — ABNORMAL HIGH (ref 70–99)
Potassium: 4.1 mmol/L (ref 3.5–5.1)
Sodium: 140 mmol/L (ref 135–145)
Total Bilirubin: 0.6 mg/dL (ref 0.3–1.2)
Total Protein: 5.5 g/dL — ABNORMAL LOW (ref 6.5–8.1)

## 2020-09-24 LAB — D-DIMER, QUANTITATIVE: D-Dimer, Quant: 0.52 ug/mL-FEU — ABNORMAL HIGH (ref 0.00–0.50)

## 2020-09-24 LAB — CBC
HCT: 42.2 % (ref 39.0–52.0)
Hemoglobin: 14.6 g/dL (ref 13.0–17.0)
MCH: 34.1 pg — ABNORMAL HIGH (ref 26.0–34.0)
MCHC: 34.6 g/dL (ref 30.0–36.0)
MCV: 98.6 fL (ref 80.0–100.0)
Platelets: 95 10*3/uL — ABNORMAL LOW (ref 150–400)
RBC: 4.28 MIL/uL (ref 4.22–5.81)
RDW: 13.8 % (ref 11.5–15.5)
WBC: 1.5 10*3/uL — ABNORMAL LOW (ref 4.0–10.5)
nRBC: 0 % (ref 0.0–0.2)

## 2020-09-24 LAB — PHOSPHORUS: Phosphorus: 4 mg/dL (ref 2.5–4.6)

## 2020-09-24 LAB — GLUCOSE, CAPILLARY
Glucose-Capillary: 147 mg/dL — ABNORMAL HIGH (ref 70–99)
Glucose-Capillary: 164 mg/dL — ABNORMAL HIGH (ref 70–99)
Glucose-Capillary: 206 mg/dL — ABNORMAL HIGH (ref 70–99)
Glucose-Capillary: 152 mg/dL — ABNORMAL HIGH (ref 70–99)

## 2020-09-24 LAB — MAGNESIUM: Magnesium: 2.1 mg/dL (ref 1.7–2.4)

## 2020-09-24 LAB — C-REACTIVE PROTEIN: CRP: 3.8 mg/dL — ABNORMAL HIGH (ref <1.0)

## 2020-09-24 LAB — FERRITIN: Ferritin: 337 ng/mL — ABNORMAL HIGH (ref 24–336)

## 2020-09-24 MED ORDER — METHOCARBAMOL 500 MG PO TABS
500.0000 mg | ORAL_TABLET | Freq: Three times a day (TID) | ORAL | Status: DC | PRN
  Administered 2020-09-24 (×3): 500 mg via ORAL
  Filled 2020-09-24 (×3): qty 1

## 2020-09-24 MED ORDER — SENNA 8.6 MG PO TABS
2.0000 | ORAL_TABLET | Freq: Every day | ORAL | Status: DC
  Administered 2020-09-24 – 2020-09-26 (×3): 17.2 mg via ORAL
  Filled 2020-09-24 (×2): qty 2

## 2020-09-24 MED ORDER — ACETAMINOPHEN 500 MG PO TABS
1000.0000 mg | ORAL_TABLET | Freq: Three times a day (TID) | ORAL | Status: DC
  Administered 2020-09-24 – 2020-09-27 (×9): 1000 mg via ORAL
  Filled 2020-09-24 (×9): qty 2

## 2020-09-24 MED ORDER — PREDNISONE 5 MG PO TABS
50.0000 mg | ORAL_TABLET | Freq: Every day | ORAL | Status: DC
  Administered 2020-09-26 – 2020-09-27 (×2): 50 mg via ORAL
  Filled 2020-09-24 (×2): qty 2

## 2020-09-24 MED ORDER — POLYETHYLENE GLYCOL 3350 17 G PO PACK
17.0000 g | PACK | Freq: Every day | ORAL | Status: DC
  Administered 2020-09-24 – 2020-09-25 (×2): 17 g via ORAL
  Filled 2020-09-24 (×2): qty 1

## 2020-09-24 MED ORDER — OXYCODONE HCL 5 MG PO TABS
10.0000 mg | ORAL_TABLET | ORAL | Status: DC | PRN
  Administered 2020-09-24 – 2020-09-27 (×5): 10 mg via ORAL
  Filled 2020-09-24 (×6): qty 2

## 2020-09-24 MED ORDER — METHYLPREDNISOLONE SODIUM SUCC 125 MG IJ SOLR
80.0000 mg | Freq: Two times a day (BID) | INTRAMUSCULAR | Status: AC
  Administered 2020-09-24 – 2020-09-26 (×4): 80 mg via INTRAVENOUS
  Filled 2020-09-24 (×4): qty 2

## 2020-09-24 MED ORDER — BENZONATATE 100 MG PO CAPS
200.0000 mg | ORAL_CAPSULE | Freq: Three times a day (TID) | ORAL | Status: DC
  Administered 2020-09-24 – 2020-09-27 (×10): 200 mg via ORAL
  Filled 2020-09-24 (×10): qty 2

## 2020-09-24 MED ORDER — MORPHINE SULFATE (PF) 2 MG/ML IV SOLN
2.0000 mg | INTRAVENOUS | Status: DC | PRN
  Administered 2020-09-24 – 2020-09-26 (×9): 2 mg via INTRAVENOUS
  Filled 2020-09-24 (×10): qty 1

## 2020-09-24 MED ORDER — ADULT MULTIVITAMIN W/MINERALS CH
1.0000 | ORAL_TABLET | Freq: Every day | ORAL | Status: DC
  Administered 2020-09-24 – 2020-09-27 (×4): 1 via ORAL
  Filled 2020-09-24 (×4): qty 1

## 2020-09-24 NOTE — Progress Notes (Signed)
Initial Nutrition Assessment  DOCUMENTATION CODES:   Not applicable  INTERVENTION:  Liberalize diet  Ensure Max po BID, each supplement provides 150 kcal and 30 grams of protein   NUTRITION DIAGNOSIS:   Increased nutrient needs related to catabolic illness (COVID-19 pneumonia) as evidenced by estimated needs.    GOAL:   Patient will meet greater than or equal to 90% of their needs    MONITOR:   Labs,I & O's,Diet advancement,Supplement acceptance,PO intake,Weight trends,Skin  REASON FOR ASSESSMENT:   Malnutrition Screening Tool    ASSESSMENT:   70 year old male with past medical history of chronic dCHF, CAD, chronic back pain, Parkinson's disease, DM2, HLD, HTN, anxiety, depression, and BPH presented with 5 day onset of weakness, cough, SOB and mechanical fall down 12 flights of stairs. Pt admitted for fall and hypoxia due to COVID-19 pneumonia.  RD working remotely.  No acute fracture, subluxation, or cord compression on imaging. Neuro recommending PT eval and pain control.  Spoke with pt via phone, reports ongoing back pain this afternoon. He reports no po intake of breakfast, says it was cold.  Pt recalls soup, banana, angel food cake, and diet soda for lunch. He was unable to order the peach cobbler as he wanted due to current diet order. Pt reports no history of DM and A1c on admission WNL. Spoke with MD via secure chat, okay to liberalize to regular diet.   Pt says he is not a "huge eater" at baseline, eats 3 meals/day. States he tries to eat healthy and "100% does not eat salt" recalls fresh/frozen vegetables, lean meats, likes yogurt, cranberry juice and cinnamon raisin bagels. RD discussed increased needs related to Covid-19 virus and the importance of adequate nutrition. He is agreeable to drinking Ensure Max during admission and would prefer the chocolate flavor.  Pt recalls usual wt around 225 lbs. Weight history reviewed, stable over the last 6  months.  Medications reviewed and include: Tessalon, Buspar, Sinemet, D3, Questran, Cymbalta, SSI, Methylprednisolone, Protonix, Miralax, Senokot, Thiamine, B12, Remdesivir  Labs: CBGs 164,147 2/5 A1c 5.5 (WNL)   NUTRITION - FOCUSED PHYSICAL EXAM:  Unable to complete at this time  Diet Order:   Diet Order            Diet heart healthy/carb modified Room service appropriate? Yes; Fluid consistency: Thin  Diet effective now                 EDUCATION NEEDS:   Not appropriate for education at this time  Skin:  Skin Assessment: Reviewed RN Assessment  Last BM:  pta  Height:   Ht Readings from Last 1 Encounters:  09/23/20 6\' 1"  (1.854 m)    Weight:   Wt Readings from Last 1 Encounters:  09/23/20 104.2 kg    BMI:  Body mass index is 30.31 kg/m.  Estimated Nutritional Needs:   Kcal:  11/21/20  Protein:  140-156  Fluid:  >2.1 L    [id number], RD, LDN Clinical Nutrition After Hours/Weekend Pager # in Amion

## 2020-09-24 NOTE — Progress Notes (Addendum)
PROGRESS NOTE                                                                                                                                                                                                             Patient Demographics:    Raymond White, is a 70 y.o. male, DOB - 05/09/1951, ACZ:[id number]  Outpatient Primary MD for the patient is Sandi Raveling   Admit date - 09/23/2020   LOS - 1  Chief Complaint  Patient presents with  . Fall       Brief Narrative: Patient is a 70 y.o. male with PMHx of chronic diastolic heart failure, chronic back pain, Parkinson's disease, DM-2, depression, HLD-who presented to the hospital with a 5-day history of weakness/cough/shortness of breath and a mechanical fall down 12 flights of stairs.  He was found to have acute hypoxic respiratory failure due to COVID-19 pneumonia-evaluated by neurosurgery for back pain-and subsequently admitted to the hospitalist service.  See below for further details.   COVID-19 vaccinated status: Vaccinated-J&J vaccine x1  Significant Events: 2/4>> Admit to [facility] for fall-hypoxia due to COVID-19 pneumonia  Significant studies: 1/10>> Echo: EF 55-60% 2/4>> chest x-ray: Bibasilar infiltrates 2/4>> CT chest: Peripheral predominant groundglass opacities-COVID-19 infection 2/4>> CT head: No acute intracranial injury 2/4>> CT C-spine: No cervical spine injury 2/4>> MRI C-spine: No obvious cord lesion/hematoma/epidural hematoma 2/4>> MRI T-spine: Degenerative changes-but no acute fracture/cord lesion/spinal canal compromise 2/4>> MRI LS spine: No acute changes-T12-L1 paracentral disc protrusion 2/5>>Chest x-ray: Bibasilar airspace disease-compatible with Covid 19  COVID-19 medications: Steroids: 2/4>> Remdesivir: 2/4>>  Antibiotics: None  Microbiology data: None  Procedures: None  Consults: Neurosurgery  DVT prophylaxis: heparin  injection 5,000 Units Start: 09/23/20 1600 Place TED hose Start: 09/23/20 1538    Subjective:    Raymond White today continues to complain of back pain-appears anxious-on 3-4 L of oxygen   Assessment  & Plan :   Acute Hypoxic Resp Failure due to Covid 19 Viral pneumonia: Appears to have moderate hypoxemia-continue with steroid/Remdesivir.  If worsens-he has consented to the use of Actemra.  Note-rationale/risk/benefits of Actemra use discussed with patient-he has no history of TB/hepatitis B/chronic diverticulitis-and agrees to the use of Actemra if his hypoxemia worsens  Fever: afebrile O2 requirements:  SpO2: 91 % O2 Flow Rate (L/min): 4 L/min   COVID-19 Labs:  Recent Labs    09/23/20 1639 09/24/20 0425  DDIMER 1.04* 0.52*  FERRITIN 348* 337*  LDH 200*  --   CRP 2.6* 3.8*       Component Value Date/Time   BNP 95.2 09/23/2020 1639    Recent Labs  Lab 09/23/20 1639  PROCALCITON <0.10    Lab Results  Component Value Date   SARSCOV2NAA POSITIVE (A) 09/23/2020   SARSCOV2NAA NEGATIVE 08/30/2020   SARSCOV2NAA NEGATIVE 08/26/2020   SARSCOV2NAA NEGATIVE 03/31/2020     Prone/Incentive Spirometry: encouraged  incentive spirometry use 3-4/hour.  Mechanical fall-acute on chronic back pain: Doubt syncope-probably has autonomic dysfunction in the setting of known Parkinson's disease.  Need to check orthostatic vital signs. Continues to have significant pain-probably from soft tissue injury-have adjusted narcotic regimen.  Await PT/OT recommendations.  Leukopenia/thrombocytopenia: Probably due to COVID-19 infection-appears to have mild chronic thrombocytopenia at baseline.  Watch closely.  Chronic diastolic heart failure: Compensated-no evidence of volume overload-continue Aldactone  CAD: No anginal symptoms-continue antiplatelet/Lipitor-Coreg on hold due to bradycardia.  Nocturnal sinus bradycardia: Asymptomatic-beta-blocker held for now-continue to monitor closely.   Recent TSH normal on 1/8  HTN: BP stable-  HLD: Continue statin  DM-2 (A1c 5.5 on 2/4): CBG stable-continue SSI  Recent Labs    09/23/20 2116 09/24/20 0748 09/24/20 1159  GLUCAP 142* 147* 164*    Parkinson's disease: Continue Sinemet  Anxiety/depression: Continue Cymbalta-somewhat anxious-due to acute pain-continue BuSpar  BPH: Continue Flomax  Obesity: Estimated body mass index is 30.31 kg/m as calculated from the following:   Height as of this encounter: 6\' 1"  (1.854 m).   Weight as of this encounter: 104.2 kg.     GI prophylaxis: PPI  ABG:    Component Value Date/Time   HCO3 20.6 08/26/2020 1900   TCO2 23 09/23/2020 0829   ACIDBASEDEF 1.0 08/26/2020 1900   O2SAT 99.0 08/26/2020 1900    Vent Settings: N/A    Condition - Stable  Family Communication  : Per patient-he has no family-he requested we call Cecille Po [phone]) his POA only if he is unable to communicate with [place].  Code Status :  Full Code  Diet :  Diet Order            Diet heart healthy/carb modified Room service appropriate? Yes; Fluid consistency: Thin  Diet effective now                  Disposition Plan  :   Status is: Inpatient  Remains inpatient appropriate because:Inpatient level of care appropriate due to severity of illness   Dispo: The patient is from: Home              Anticipated d/c is to: Home              Anticipated d/c date is: > 3 days              Patient currently is not medically stable to d/c.   Difficult to place patient No  Barriers to discharge: Hypoxia requiring O2 supplementation/complete 5 days of IV Remdesivir  Antimicorbials  :    Anti-infectives (From admission, onward)   Start     Dose/Rate Route Frequency Ordered Stop   09/24/20 1000  remdesivir 100 mg in sodium chloride 0.9 % 100 mL IVPB       "Followed by" Linked Group Details   100 mg 200 mL/hr over 30 Minutes Intravenous Daily 09/23/20 1545 09/28/20 0959   09/23/20 1700   remdesivir 200  mg in sodium chloride 0.9% 250 mL IVPB       "Followed by" Linked Group Details   200 mg 580 mL/hr over 30 Minutes Intravenous Once 09/23/20 1545 09/23/20 1825      Inpatient Medications  Scheduled Meds: . acetaminophen  1,000 mg Oral Q8H  . aspirin  81 mg Oral Daily  . benzonatate  200 mg Oral TID  . busPIRone  10 mg Oral TID  . carbidopa-levodopa  2.5 tablet Oral TID  . cholecalciferol  1,000 Units Oral Daily  . cholestyramine  4 g Oral TID  . clopidogrel  75 mg Oral Daily  . DULoxetine  30 mg Oral BID  . feeding supplement  237 mL Oral BID BM  . heparin  5,000 Units Subcutaneous Q8H  . insulin aspart  0-9 Units Subcutaneous TID WC  . methylPREDNISolone (SOLU-MEDROL) injection  1 mg/kg Intravenous Q12H   Followed by  . [START ON 09/26/2020] predniSONE  50 mg Oral Daily  . mometasone-formoterol  2 puff Inhalation BID  . pantoprazole  40 mg Oral Daily  . polyethylene glycol  17 g Oral Daily  . rosuvastatin  40 mg Oral Daily  . senna  2 tablet Oral QHS  . spironolactone  25 mg Oral Daily  . tamsulosin  0.4 mg Oral QHS  . thiamine  100 mg Oral Daily  . vitamin B-12  1,000 mcg Oral Daily   Continuous Infusions: . remdesivir 100 mg in NS 100 mL 100 mg (09/24/20 1044)   PRN Meds:.albuterol, bisacodyl, diphenoxylate-atropine, methocarbamol, morphine injection, ondansetron **OR** ondansetron (ZOFRAN) IV, oxyCODONE   Time Spent in minutes  35     See all Orders from today for further details   Jeoffrey Massed M.D on 09/24/2020 at 12:17 PM  To page go to [email or web address] - use universal password  [facility] -  Office  [phone]    Objective:   Vitals:   09/23/20 1800 09/23/20 2016 09/24/20 0749 09/24/20 1157  BP: 111/67 101/61 122/80 102/66  Pulse: 67 (!) 56 (!) 45 60  Resp: (!) 22 20 14 20   Temp:  97.9 F (36.6 C) 97.6 F (36.4 C) 97.7 F (36.5 C)  TempSrc:  Oral Oral Axillary  SpO2: 92% 92% 92% 91%  Weight:      Height:        Wt  Readings from Last 3 Encounters:  09/23/20 104.2 kg  08/31/20 104.2 kg  06/21/20 104.3 kg     Intake/Output Summary (Last 24 hours) at 09/24/2020 1217 Last data filed at 09/23/2020 1825 Gross per 24 hour  Intake 250 ml  Output --  Net 250 ml     Physical Exam Gen Exam:Alert awake-not in any distress HEENT:atraumatic, normocephalic Chest: B/L clear to auscultation anteriorly CVS:S1S2 regular Abdomen:soft non tender, non distended Extremities:no edema Neurology: Non focal Skin: no rash   Data Review:    CBC Recent Labs  Lab 09/23/20 0818 09/23/20 0829 09/24/20 0746  WBC 2.4*  --  1.5*  HGB 14.3 13.6 14.6  HCT 41.8 40.0 42.2  PLT 89*  --  95*  MCV 98.6  --  98.6  MCH 33.7  --  34.1*  MCHC 34.2  --  34.6  RDW 14.0  --  13.8    Chemistries  Recent Labs  Lab 09/23/20 0818 09/23/20 0829 09/24/20 0425  NA 138 140 140  K 3.9 4.0 4.1  CL 106 105 110  CO2 22  --  19*  GLUCOSE 101* 96 146*  BUN 8 10 12   CREATININE 0.92 0.70 0.74  CALCIUM 8.5*  --  8.2*  MG  --   --  2.1  AST 21  --  19  ALT 14  --  6  ALKPHOS 69  --  65  BILITOT 1.1  --  0.6   ------------------------------------------------------------------------------------------------------------------ No results for input(s): CHOL, HDL, LDLCALC, TRIG, CHOLHDL, LDLDIRECT in the last 72 hours.  Lab Results  Component Value Date   HGBA1C 5.5 09/23/2020   ------------------------------------------------------------------------------------------------------------------ No results for input(s): TSH, T4TOTAL, T3FREE, THYROIDAB in the last 72 hours.  Invalid input(s): FREET3 ------------------------------------------------------------------------------------------------------------------ Recent Labs    09/23/20 1639 09/24/20 0425  FERRITIN 348* 337*    Coagulation profile Recent Labs  Lab 09/23/20 0818  INR 1.0    Recent Labs    09/23/20 1639 09/24/20 0425  DDIMER 1.04* 0.52*    Cardiac  Enzymes No results for input(s): CKMB, TROPONINI, MYOGLOBIN in the last 168 hours.  Invalid input(s): CK ------------------------------------------------------------------------------------------------------------------    Component Value Date/Time   BNP 95.2 09/23/2020 1639    Micro Results Recent Results (from the past 240 hour(s))  SARS Coronavirus 2 by RT PCR (hospital order, performed in [facility] hospital lab) Nasopharyngeal Nasopharyngeal Swab     Status: Abnormal   Collection Time: 09/23/20  8:18 AM   Specimen: Nasopharyngeal Swab  Result Value Ref Range Status   SARS Coronavirus 2 POSITIVE (A) NEGATIVE Final    Comment: RESULT CALLED TO, READ BACK BY AND VERIFIED WITH: RN E.BANKS AT 1610 ON 09/23/2020 BY T.SAAD (NOTE) SARS-CoV-2 target nucleic acids are DETECTED  SARS-CoV-2 RNA is generally detectable in upper respiratory specimens  during the acute phase of infection.  Positive results are indicative  of the presence of the identified virus, but do not rule out bacterial infection or co-infection with other pathogens not detected by the test.  Clinical correlation with patient history and  other diagnostic information is necessary to determine patient infection status.  The expected result is negative.  Fact Sheet for Patients:   BoilerBrush.com.cy   Fact Sheet for Healthcare Providers:   https://pope.com/    This test is not yet approved or cleared by the [place] FDA and  has been authorized for detection and/or diagnosis of SARS-CoV-2 by FDA under an Emergency Use Authorization (EUA).  This EUA will remain in effect (meaning  this test can be used) for the duration of  the COVID-19 declaration under Section 564(b)(1) of the Act, 21 U.S.C. section 360-bbb-3(b)(1), unless the authorization is terminated or revoked sooner.  Performed at [facility], [address], [place], [place] [address]      Radiology Reports DG Chest 2 View  Result Date: 08/26/2020 CLINICAL DATA:  Shortness of breath, worse for 2 days EXAM: CHEST - 2 VIEW COMPARISON:  March 28, 2020 FINDINGS: Trachea midline. Cardiomediastinal contours and hilar structures are normal. No lobar consolidative changes. Basilar atelectasis and signs of pleural and parenchymal scarring noted in the RIGHT chest as before. LEFT chest is clear. Signs of prior rib resection involving RIGHT posterolateral rib 7. On limited assessment no acute skeletal process. IMPRESSION: Basilar atelectasis and signs of pleural and parenchymal scarring in the RIGHT chest related to postoperative changes as before. Electronically Signed   By: Donzetta Kohut M.D.   On: 08/26/2020 09:54   CT HEAD WO CONTRAST  Result Date: 09/23/2020 CLINICAL DATA:  Minor head trauma EXAM: CT HEAD WITHOUT CONTRAST CT CERVICAL SPINE WITHOUT CONTRAST TECHNIQUE: Multidetector  CT imaging of the head and cervical spine was performed following the standard protocol without intravenous contrast. Multiplanar CT image reconstructions of the cervical spine were also generated. COMPARISON:  None. FINDINGS: CT HEAD FINDINGS Brain: No evidence of acute infarction, hemorrhage, hydrocephalus, extra-axial collection or mass lesion/mass effect. Generalized atrophy. Vascular: No hyperdense vessel or unexpected calcification. Skull: Normal. Negative for fracture or focal lesion. Sinuses/Orbits: No acute finding. CT CERVICAL SPINE FINDINGS Alignment: Straightening of the cervical spine. Skull base and vertebrae: No acute fracture or focal bone lesion. Soft tissues and spinal canal: No prevertebral fluid or swelling. No visible canal hematoma. Disc levels: C4-5 and C5-6 solid arthrodesis. Disc degeneration is diffuse and advanced. 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 There is facet ankylosis at C2-3 on the left. Upper chest: No evidence of injury IMPRESSION: No evidence of acute intracranial or  cervical spine injury. Electronically Signed   By: Marnee Spring M.D.   On: 09/23/2020 09:37   CT Angio Chest PE W/Cm &/Or Wo Cm  Result Date: 08/27/2020 CLINICAL DATA:  Chest pain or SOB, pleurisy or effusion suspected Shortness of breath, worse for the past 2 days. EXAM: CT ANGIOGRAPHY CHEST WITH CONTRAST TECHNIQUE: Multidetector CT imaging of the chest was performed using the standard protocol during bolus administration of intravenous contrast. Multiplanar CT image reconstructions and MIPs were obtained to evaluate the vascular anatomy. CONTRAST:  OMNIPAQUE IOHEXOL 350 MG/ML SOLN COMPARISON:  Radiograph earlier today.  Chest CTA 06/24/2019 FINDINGS: Cardiovascular: Motion artifact particularly through the lung bases. Allowing for motion artifact, no evidence of acute pulmonary embolus. Aortic atherosclerosis and tortuosity. No dissection or acute aortic findings. Common origin of the brachiocephalic and left common carotid artery, variant arch anatomy. Heart is normal in size. There are coronary artery calcifications. No pericardial effusion. Mediastinum/Nodes: Small mediastinal and hilar lymph nodes not enlarged by size criteria. Small hiatal hernia. Upper esophagus is patulous. No suspicious thyroid nodule. Lungs/Pleura: Chronic elevation of the right hemidiaphragm with postsurgical or remote posttraumatic change of right ribs and chronic basilar consolidation, hyperdensity abutting the hemidiaphragm may be secondary to prior talc pleurodesis. Findings are stable from prior exam. Chronic atelectasis in the dependent right lower lobe and right middle lobe. No evidence of pneumonia or acute airspace disease. No pleural fluid. No findings of pulmonary edema. Trachea and central bronchi are patent. Upper Abdomen: Cholecystectomy.  No acute upper abdominal findings. Musculoskeletal: Postsurgical versus remote posttraumatic change of lower right ribs. Partial ankylosis at the thoracolumbar junction,  chronic. Diffuse degenerative change in the thoracic spine. Review of the MIP images confirms the above findings. IMPRESSION: 1. No acute pulmonary embolus or acute intrathoracic abnormality. 2. Chronic elevation of the right hemidiaphragm with postsurgical or remote posttraumatic change of right ribs and chronic basilar consolidation. Chronic hyperdensity abutting the hemidiaphragm may be secondary to prior talc pleurodesis. Chronic atelectasis in the dependent right lower lobe and right middle lobe. 3. Coronary artery calcifications. Aortic Atherosclerosis (ICD10-I70.0). Electronically Signed   By: Narda Rutherford M.D.   On: 08/27/2020 00:02   CT CERVICAL SPINE WO CONTRAST  Result Date: 09/23/2020 CLINICAL DATA:  Minor head trauma EXAM: CT HEAD WITHOUT CONTRAST CT CERVICAL SPINE WITHOUT CONTRAST TECHNIQUE: Multidetector CT imaging of the head and cervical spine was performed following the standard protocol without intravenous contrast. Multiplanar CT image reconstructions of the cervical spine were also generated. COMPARISON:  None. FINDINGS: CT HEAD FINDINGS Brain: No evidence of acute infarction, hemorrhage, hydrocephalus, extra-axial collection or mass lesion/mass effect. Generalized atrophy.  Vascular: No hyperdense vessel or unexpected calcification. Skull: Normal. Negative for fracture or focal lesion. Sinuses/Orbits: No acute finding. CT CERVICAL SPINE FINDINGS Alignment: Straightening of the cervical spine. Skull base and vertebrae: No acute fracture or focal bone lesion. Soft tissues and spinal canal: No prevertebral fluid or swelling. No visible canal hematoma. Disc levels: C4-5 and C5-6 solid arthrodesis. Disc degeneration is diffuse and advanced. 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 There is facet ankylosis at C2-3 on the left. Upper chest: No evidence of injury IMPRESSION: No evidence of acute intracranial or cervical spine injury. Electronically Signed   By: Marnee SpringJonathon  Watts M.D.   On:  09/23/2020 09:37   MR Cervical Spine Wo Contrast  Result Date: 09/23/2020 CLINICAL DATA:  Multiple trauma.  Fell downstairs. EXAM: MRI CERVICAL, THORACIC AND LUMBAR SPINE WITHOUT CONTRAST TECHNIQUE: Multiplanar and multiecho pulse sequences of the cervical spine, to include the craniocervical junction and cervicothoracic junction, and thoracic and lumbar spine, were obtained without intravenous contrast. COMPARISON:  CT scans, same date. FINDINGS: MRI CERVICAL SPINE FINDINGS Exam is quite limited due to patient motion. Alignment: Normal Vertebrae: No acute bony findings. No definite fracture or bone lesion. Cord: Grossly normal.  No obvious cord lesion or cord hematoma. Posterior Fossa, vertebral arteries, paraspinal tissues: No significant findings. Disc levels: Multilevel disc disease and facet disease but no significant canal compromise or canal hematoma. MRI THORACIC SPINE FINDINGS Alignment: Scoliosis but normal overall alignment in the sagittal plane. Vertebrae: No acute thoracic spine fractures are identified. The facets are normally aligned. No facet fractures. Cord: No obvious cord lesions or cord hematoma. No intraspinal compressive hematoma. Paraspinal and other soft tissues: No significant findings. Disc levels: Multilevel disc disease and facet disease. Shallow multilevel disc protrusions and areas of osteophytic ridging but no significant canal compromise. MRI LUMBAR SPINE FINDINGS Segmentation: There are five lumbar type vertebral bodies. The last full intervertebral disc space is labeled L5-S1. Alignment: Degenerative lumbar spondylosis with multilevel degenerative listhesis. Vertebrae: No acute bony findings. No fracture or worrisome bone lesion. Conus medullaris and cauda equina: Conus extends to the L1. level. Conus and cauda equina appear normal. Paraspinal and other soft tissues: No significant paraspinal or retroperitoneal findings. Right renal cyst is noted. Disc levels: T12-L1: Focal right  paracentral disc protrusion with mass effect on the thecal sac potentially involving the right L1 nerve root. No spinal stenosis. L1-2: No significant findings. L2-3: Wide decompressive laminectomy. Degenerative retrolisthesis of L2 compared to L3 but no significant spinal or lateral recess stenosis. Mild foraminal narrowing bilaterally. L3-4: Wide decompressive laminectomy. No significant spinal or foraminal stenosis. L4-5: Wide decompressive laminectomy. Mild bilateral foraminal stenosis but no significant spinal stenosis. Mild right lateral recess stenosis. L5-S1: Mild bilateral foraminal stenosis but no spinal or lateral recess stenosis. Advanced facet disease. IMPRESSION: 1. Very limited MR examination of the cervical spine but no obvious cord lesion, cord hematoma or epidural hematoma. 2. Degenerative changes involving the thoracic spine but no acute fracture, cord lesion or spinal canal compromise. 3. Focal right paracentral disc protrusion at T12-L1 potentially involving the right L1 nerve root. 4. Advanced degenerative changes involving the lumbar spine but no significant canal compromise. 5. Postoperative changes involving the lumbar spine with wide decompressive laminectomies. Electronically Signed   By: Rudie MeyerP.  Gallerani M.D.   On: 09/23/2020 13:21   MR THORACIC SPINE WO CONTRAST  Result Date: 09/23/2020 CLINICAL DATA:  Multiple trauma.  Fell downstairs. EXAM: MRI CERVICAL, THORACIC AND LUMBAR SPINE WITHOUT CONTRAST TECHNIQUE: Multiplanar and multiecho  pulse sequences of the cervical spine, to include the craniocervical junction and cervicothoracic junction, and thoracic and lumbar spine, were obtained without intravenous contrast. COMPARISON:  CT scans, same date. FINDINGS: MRI CERVICAL SPINE FINDINGS Exam is quite limited due to patient motion. Alignment: Normal Vertebrae: No acute bony findings. No definite fracture or bone lesion. Cord: Grossly normal.  No obvious cord lesion or cord hematoma.  Posterior Fossa, vertebral arteries, paraspinal tissues: No significant findings. Disc levels: Multilevel disc disease and facet disease but no significant canal compromise or canal hematoma. MRI THORACIC SPINE FINDINGS Alignment: Scoliosis but normal overall alignment in the sagittal plane. Vertebrae: No acute thoracic spine fractures are identified. The facets are normally aligned. No facet fractures. Cord: No obvious cord lesions or cord hematoma. No intraspinal compressive hematoma. Paraspinal and other soft tissues: No significant findings. Disc levels: Multilevel disc disease and facet disease. Shallow multilevel disc protrusions and areas of osteophytic ridging but no significant canal compromise. MRI LUMBAR SPINE FINDINGS Segmentation: There are five lumbar type vertebral bodies. The last full intervertebral disc space is labeled L5-S1. Alignment: Degenerative lumbar spondylosis with multilevel degenerative listhesis. Vertebrae: No acute bony findings. No fracture or worrisome bone lesion. Conus medullaris and cauda equina: Conus extends to the L1. level. Conus and cauda equina appear normal. Paraspinal and other soft tissues: No significant paraspinal or retroperitoneal findings. Right renal cyst is noted. Disc levels: T12-L1: Focal right paracentral disc protrusion with mass effect on the thecal sac potentially involving the right L1 nerve root. No spinal stenosis. L1-2: No significant findings. L2-3: Wide decompressive laminectomy. Degenerative retrolisthesis of L2 compared to L3 but no significant spinal or lateral recess stenosis. Mild foraminal narrowing bilaterally. L3-4: Wide decompressive laminectomy. No significant spinal or foraminal stenosis. L4-5: Wide decompressive laminectomy. Mild bilateral foraminal stenosis but no significant spinal stenosis. Mild right lateral recess stenosis. L5-S1: Mild bilateral foraminal stenosis but no spinal or lateral recess stenosis. Advanced facet disease.  IMPRESSION: 1. Very limited MR examination of the cervical spine but no obvious cord lesion, cord hematoma or epidural hematoma. 2. Degenerative changes involving the thoracic spine but no acute fracture, cord lesion or spinal canal compromise. 3. Focal right paracentral disc protrusion at T12-L1 potentially involving the right L1 nerve root. 4. Advanced degenerative changes involving the lumbar spine but no significant canal compromise. 5. Postoperative changes involving the lumbar spine with wide decompressive laminectomies. Electronically Signed   By: Rudie Meyer M.D.   On: 09/23/2020 13:21   MR THORACIC SPINE WO CONTRAST  Result Date: 08/30/2020 CLINICAL DATA:  Recent fall. Fecal incontinence with lower extremity numbness. EXAM: MRI THORACIC SPINE WITHOUT CONTRAST TECHNIQUE: Multiplanar, multisequence MR imaging of the thoracic spine was performed. No intravenous contrast was administered. COMPARISON:  None. FINDINGS: Alignment:  Physiologic. Vertebrae: There is edema within the proximal left ribs and the left lamina and pars interarticularis of T8 and T9. Edema in the right laminae is present to a lesser extent. No acute fracture. Cord:  Normal signal and morphology. Paraspinal and other soft tissues: Negative. Disc levels: There are small disc protrusions at all levels without spinal canal stenosis. There is posterior vertebral body fusion at T11-12. IMPRESSION: 1. Edema within the proximal left ribs and the left posterior elements of T8 and T9. This could indicate fracture or contusion. CT of the thoracic spine may be helpful further assessment. 2. Small disc protrusions at all levels without spinal canal stenosis. Electronically Signed   By: Deatra Robinson M.D.   On: 08/30/2020  22:50   MR LUMBAR SPINE WO CONTRAST  Result Date: 09/23/2020 CLINICAL DATA:  Multiple trauma.  Fell downstairs. EXAM: MRI CERVICAL, THORACIC AND LUMBAR SPINE WITHOUT CONTRAST TECHNIQUE: Multiplanar and multiecho pulse  sequences of the cervical spine, to include the craniocervical junction and cervicothoracic junction, and thoracic and lumbar spine, were obtained without intravenous contrast. COMPARISON:  CT scans, same date. FINDINGS: MRI CERVICAL SPINE FINDINGS Exam is quite limited due to patient motion. Alignment: Normal Vertebrae: No acute bony findings. No definite fracture or bone lesion. Cord: Grossly normal.  No obvious cord lesion or cord hematoma. Posterior Fossa, vertebral arteries, paraspinal tissues: No significant findings. Disc levels: Multilevel disc disease and facet disease but no significant canal compromise or canal hematoma. MRI THORACIC SPINE FINDINGS Alignment: Scoliosis but normal overall alignment in the sagittal plane. Vertebrae: No acute thoracic spine fractures are identified. The facets are normally aligned. No facet fractures. Cord: No obvious cord lesions or cord hematoma. No intraspinal compressive hematoma. Paraspinal and other soft tissues: No significant findings. Disc levels: Multilevel disc disease and facet disease. Shallow multilevel disc protrusions and areas of osteophytic ridging but no significant canal compromise. MRI LUMBAR SPINE FINDINGS Segmentation: There are five lumbar type vertebral bodies. The last full intervertebral disc space is labeled L5-S1. Alignment: Degenerative lumbar spondylosis with multilevel degenerative listhesis. Vertebrae: No acute bony findings. No fracture or worrisome bone lesion. Conus medullaris and cauda equina: Conus extends to the L1. level. Conus and cauda equina appear normal. Paraspinal and other soft tissues: No significant paraspinal or retroperitoneal findings. Right renal cyst is noted. Disc levels: T12-L1: Focal right paracentral disc protrusion with mass effect on the thecal sac potentially involving the right L1 nerve root. No spinal stenosis. L1-2: No significant findings. L2-3: Wide decompressive laminectomy. Degenerative retrolisthesis of L2  compared to L3 but no significant spinal or lateral recess stenosis. Mild foraminal narrowing bilaterally. L3-4: Wide decompressive laminectomy. No significant spinal or foraminal stenosis. L4-5: Wide decompressive laminectomy. Mild bilateral foraminal stenosis but no significant spinal stenosis. Mild right lateral recess stenosis. L5-S1: Mild bilateral foraminal stenosis but no spinal or lateral recess stenosis. Advanced facet disease. IMPRESSION: 1. Very limited MR examination of the cervical spine but no obvious cord lesion, cord hematoma or epidural hematoma. 2. Degenerative changes involving the thoracic spine but no acute fracture, cord lesion or spinal canal compromise. 3. Focal right paracentral disc protrusion at T12-L1 potentially involving the right L1 nerve root. 4. Advanced degenerative changes involving the lumbar spine but no significant canal compromise. 5. Postoperative changes involving the lumbar spine with wide decompressive laminectomies. Electronically Signed   By: Rudie Meyer M.D.   On: 09/23/2020 13:21   MR LUMBAR SPINE WO CONTRAST  Result Date: 08/26/2020 CLINICAL DATA:  Initial evaluation for acute low back pain, cauda equina syndrome suspected. EXAM: MRI LUMBAR SPINE WITHOUT CONTRAST TECHNIQUE: Multiplanar, multisequence MR imaging of the lumbar spine was performed. No intravenous contrast was administered. COMPARISON:  Previous MRI from 06/21/2020 FINDINGS: Segmentation:  Examination degraded by motion artifact. Standard segmentation. Lowest well-formed disc space labeled the L5-S1 level. Alignment: Mild retrolisthesis of L2 on L3 and L3 on L4, stable. Underlying dextroscoliosis. Appearance is stable from previous. Vertebrae: Vertebral body height maintained without acute or interval fracture. T11 and T12 vertebral bodies are partially ankylosed posteriorly. Bone marrow signal intensity diffusely heterogeneous but within normal limits. No worrisome osseous lesions. Discogenic  reactive endplate change present throughout the lumbar spine, most pronounced at L4-5 on the right. Conus medullaris and  cauda equina: Conus extends to the L1 level. Conus and cauda equina appear normal. Paraspinal and other soft tissues: Chronic postoperative changes present within the posterior paraspinous soft tissues. Approximate 1 cm simple exophytic cyst noted extending from the interpolar right kidney. Additional 4.6 cm simple cyst present at the lower pole of the right kidney. Extrarenal pelvises noted bilaterally. Visualized visceral structures otherwise unremarkable. Disc levels: T12-L1: Mild disc bulge with disc desiccation. Superimposed small right subarticular disc protrusion indents the right ventral thecal sac (series 8, image 5). Mild facet hypertrophy. No significant spinal stenosis. Foramina remain patent. L1-2: Degenerative intervertebral disc space narrowing with mild disc bulge. Discogenic reactive endplate change. Mild bilateral facet hypertrophy. No significant spinal stenosis. Foramina remain patent. L2-3: Retrolisthesis. Advanced degenerative intervertebral disc space narrowing with diffuse disc bulge, asymmetric to the left. Associated prominent reactive endplate change with marginal endplate osteophytic spurring. Prior posterior decompression. Residual mild facet hypertrophy. No significant spinal stenosis. Mild to moderate bilateral foraminal narrowing. Appearance is stable. L3-4: Retrolisthesis. Advanced degenerative intervertebral disc space narrowing with diffuse disc bulge. Associated discogenic reactive endplate change with marginal endplate osteophytic spurring, greater on the left. Prior posterior decompression. Residual right greater than left facet hypertrophy. Stable thecal sac patency without significant spinal stenosis. Mild to moderate left greater than right L3 foraminal narrowing, stable. L4-5: Degenerative intervertebral disc space narrowing with diffuse disc bulge and  disc desiccation. Associated reactive endplate changes. Superimposed small central to right subarticular disc protrusion with inferior migration (series 3, image 10), stable from previous. Protruding disc closely approximates the right greater than left descending L5 nerve roots, either which could be affected. The right L5 nerve root is slightly displaced posteriorly. Prior posterior decompression. Residual moderate facet hypertrophy. Mild to moderate bilateral lateral recess narrowing without significant spinal stenosis. Severe right with moderate left L4 foraminal narrowing. Appearance is relatively stable. L5-S1: Diffuse disc bulge with disc desiccation and intervertebral disc space narrowing. Associated reactive endplate spurring. Superimposed small central disc protrusion with slight inferior migration. Mild to moderate bilateral facet hypertrophy. Mild narrowing of the lateral recesses bilaterally. Central canal remains patent. 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 Appearance is stable. IMPRESSION: 1. No evidence for cord compression or other acute abnormality within the lumbar spine. Relatively stable appearance as compared to most recent MRI from 06/21/2020. Stable severe multilevel degenerative spondylosis with severe foraminal stenosis on the right at L4-5 and bilaterally at L5-S1. 2. More moderate foraminal narrowing bilaterally at L3-4 and on the left at L4-5. 3. Central to right subarticular disc protrusion at L4-5, potentially irritating either of the descending L5 nerve roots, right greater than left. Electronically Signed   By: Rise Mu M.D.   On: 08/26/2020 22:45   DG Pelvis Portable  Result Date: 09/23/2020 CLINICAL DATA:  Fall, pelvic pain EXAM: PORTABLE PELVIS 1-2 VIEWS COMPARISON:  None. FINDINGS: There is no evidence of pelvic fracture or diastasis. No pelvic bone lesions are seen. Degenerative changes are seen within the a lumbosacral junction, not well assessed  on this exam. IMPRESSION: Negative. Electronically Signed   By: Helyn Numbers MD   On: 09/23/2020 08:31   CT CHEST ABDOMEN PELVIS W CONTRAST  Result Date: 09/23/2020 CLINICAL DATA:  Fall down steps EXAM: CT CHEST, ABDOMEN, AND PELVIS WITH CONTRAST TECHNIQUE: Multidetector CT imaging of the chest, abdomen and pelvis was performed following the standard protocol during bolus administration of intravenous contrast. CONTRAST:  OMNIPAQUE IOHEXOL 300 MG/ML  SOLN COMPARISON:  August 26, 2020 FINDINGS:  CT CHEST FINDINGS Cardiovascular: No evidence of acute aortic injury with limited evaluation of the aortic root secondary to cardiac motion. LEFT-sided coronary artery atherosclerotic calcifications heart is mildly enlarged. No pericardial effusion. Scattered atherosclerotic calcifications throughout the aorta. Mediastinum/Nodes: Thyroid is unremarkable. No suspicious axillary or mediastinal lymph nodes. Lungs/Pleura: No pleural effusion or pneumothorax. Revisualization of elevation of the RIGHT hemidiaphragm with chronic calcific densities and scarring overlying the RIGHT hemidiaphragm. There are new peripheral predominant ground-glass opacities throughout bilateral lungs. Musculoskeletal: Remote RIGHT-sided rib fractures. There is fat stranding overlying the RIGHT lateral soft tissues, partially excluded from the field of view. No evidence of active contrast extravasation. Multilevel degenerative changes of the thoracic spine better assessed on contemporaneous dedicated CT. CT ABDOMEN PELVIS FINDINGS Hepatobiliary: Status post cholecystectomy. Unchanged central biliary prominence, likely due to post cholecystectomy state. No evidence of hepatic laceration. Pancreas: No peripancreatic fat stranding. Spleen: No splenic injury or perisplenic hematoma. Adrenals/Urinary Tract: Adrenal glands are unremarkable. Kidneys enhance symmetrically. No evidence of renal laceration. RIGHT-sided renal cysts. Additional  subcentimeter hypodense lesions are too small to accurately characterize. Bladder is decompressed with circumferentially thickened walls; this likely reflects a sequela of chronic outlet obstruction. Stomach/Bowel: Hiatal hernia with a LEFT-sided esophageal diverticulum, unchanged. No evidence of bowel obstruction. Scattered diverticulosis. Vascular/Lymphatic: Mild atherosclerotic calcifications of the aorta. No suspicious lymphadenopathy. 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 Other: Fat containing LEFT inguinal hernia.  No free fluid. Musculoskeletal: Multilevel degenerative changes of the lumbar spine better assessed on contemporaneous CT. IMPRESSION: 1. No evidence of solid organ or aortic injury. Fat stranding along the RIGHT lateral chest wall and flank likely reflects contusion/bruising. 2. Peripheral predominant ground-glass opacities likely reflect atypical infection such as COVID 19 infection. 3. Please see separately dictated report regarding thoracolumbar spine CT. Aortic Atherosclerosis (ICD10-I70.0). Electronically Signed   By: Meda Klinefelter MD   On: 09/23/2020 10:13   CT T-SPINE NO CHARGE  Result Date: 09/23/2020 CLINICAL DATA:  Larey Seat downstairs.  Back pain. EXAM: CT THORACIC AND LUMBAR SPINE WITHOUT CONTRAST TECHNIQUE: Multidetector CT imaging of the thoracic and lumbar spine was performed without contrast. Multiplanar CT image reconstructions were also generated. COMPARISON:  MRI thoracic spine 08/30/2020 and MRI lumbar spine 08/26/2020. FINDINGS: CT THORACIC SPINE FINDINGS Alignment: Stable right convex thoracic scoliosis. Normal overall alignment of the thoracic vertebral bodies in the sagittal plane. Vertebrae: No acute thoracic spine fracture is identified. Stable degenerative changes. The facets are normally aligned. Paraspinal and other soft tissues: No significant paraspinal findings. Dependent bibasilar atelectasis is noted. No mediastinal mass or hematoma. No  paraspinal hematoma. The visualized posterior ribs are intact. Disc levels: No large disc protrusions, significant spinal or foraminal stenosis. No intraspinal hematoma. CT LUMBAR SPINE FINDINGS Segmentation: There are five lumbar type vertebral bodies. The last full intervertebral disc space is labeled L5-S1. Alignment: Normal overall alignment. 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 Vertebrae: No acute lumbar spine fracture. Postoperative changes with wide decompressive laminectomies. No worrisome bone lesions. Paraspinal and other soft tissues: No significant paraspinal or retroperitoneal findings. Moderate aortic calcifications but no aneurysm. A right renal cyst is noted. Disc levels: Decompressive laminectomies without significant canal stenosis. No hematoma or abnormal fluid collections. The upper sacrum is intact and the SI joints are intact. IMPRESSION: 1. No acute thoracic or lumbar spine fracture is identified. 2. Stable right convex thoracic scoliosis. 3. 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 4. Postoperative changes involving the lumbar spine  with wide decompressive laminectomies. Aortic Atherosclerosis (ICD10-I70.0). Electronically Signed   By: Rudie Meyer M.D.   On: 09/23/2020 10:08   CT L-SPINE NO CHARGE  Result Date: 09/23/2020 CLINICAL DATA:  Larey Seat downstairs.  Back pain. EXAM: CT THORACIC AND LUMBAR SPINE WITHOUT CONTRAST TECHNIQUE: Multidetector CT imaging of the thoracic and lumbar spine was performed without contrast. Multiplanar CT image reconstructions were also generated. COMPARISON:  MRI thoracic spine 08/30/2020 and MRI lumbar spine 08/26/2020. FINDINGS: CT THORACIC SPINE FINDINGS Alignment: Stable right convex thoracic scoliosis. Normal overall alignment of the thoracic vertebral bodies in the sagittal plane. Vertebrae: No acute thoracic spine fracture is identified. Stable degenerative  changes. The facets are normally aligned. Paraspinal and other soft tissues: No significant paraspinal findings. Dependent bibasilar atelectasis is noted. No mediastinal mass or hematoma. No paraspinal hematoma. The visualized posterior ribs are intact. Disc levels: No large disc protrusions, significant spinal or foraminal stenosis. No intraspinal hematoma. CT LUMBAR SPINE FINDINGS Segmentation: There are five lumbar type vertebral bodies. The last full intervertebral disc space is labeled L5-S1. Alignment: Normal overall alignment. 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 Vertebrae: No acute lumbar spine fracture. Postoperative changes with wide decompressive laminectomies. No worrisome bone lesions. Paraspinal and other soft tissues: No significant paraspinal or retroperitoneal findings. Moderate aortic calcifications but no aneurysm. A right renal cyst is noted. Disc levels: Decompressive laminectomies without significant canal stenosis. No hematoma or abnormal fluid collections. The upper sacrum is intact and the SI joints are intact. IMPRESSION: 1. No acute thoracic or lumbar spine fracture is identified. 2. Stable right convex thoracic scoliosis. 3. 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 4. Postoperative changes involving the lumbar spine with wide decompressive laminectomies. Aortic Atherosclerosis (ICD10-I70.0). Electronically Signed   By: Rudie Meyer M.D.   On: 09/23/2020 10:08   DG Chest Port 1 View  Result Date: 09/23/2020 CLINICAL DATA:  Chest pain after fall. EXAM: PORTABLE CHEST 1 VIEW COMPARISON:  August 28, 2020. FINDINGS: The heart size and mediastinal contours are within normal limits. No pneumothorax is noted. Mild bibasilar subsegmental atelectasis or infiltrates are noted with small right pleural effusion. The visualized skeletal structures are unremarkable. IMPRESSION: Mild bibasilar subsegmental  atelectasis or infiltrates are noted with small right pleural effusion. Electronically Signed   By: Lupita Raider M.D.   On: 09/23/2020 08:41   DG Chest Port 1 View  Result Date: 08/28/2020 CLINICAL DATA:  Hypoxia EXAM: PORTABLE CHEST 1 VIEW COMPARISON:  August 26, 2020 FINDINGS: The cardiomediastinal silhouette is stable. No pneumothorax. Right basilar atelectasis, unchanged. Elevation right hemidiaphragm identified. Mild opacity in the left base is new in the interval, favored to represent atelectasis. Air under the right hemidiaphragm is noted to be interposed colon seen on previous CT imaging. No other acute abnormalities. IMPRESSION: 1. New mild opacity in the left base, favored to represent atelectasis. 2. Stable right basilar atelectasis and elevation of the right hemidiaphragm. Electronically Signed   By: Gerome Sam III M.D   On: 08/28/2020 19:17   DG Chest Port 1V same Day  Result Date: 09/24/2020 CLINICAL DATA:  Shortness of breath.  COVID positive.  Fall at home. EXAM: PORTABLE CHEST 1 VIEW COMPARISON:  One-view chest x-ray 09/23/2020 FINDINGS: Heart is enlarged. Bibasilar airspace opacities are again seen, slightly increased bilaterally. Bilateral effusions are present. IMPRESSION: 1. Slight increase in bibasilar airspace opacities compatible COVID 19 infection. 2. Stable bilateral pleural effusions. Electronically Signed   By:  Marin Roberts M.D.   On: 09/24/2020 11:36   ECHOCARDIOGRAM COMPLETE  Result Date: 08/29/2020    ECHOCARDIOGRAM REPORT   Patient Name:   HASHEEM VOLAND Date of Exam: 08/29/2020 Medical Rec #:  [id number]         Height:       73.0 in Accession #:    [id number]        Weight:       227.7 lb Date of Birth:  December 10, 1950         BSA:          2.274 m Patient Age:    69 years          BP:           102/59 mmHg Patient Gender: M                 HR:           64 bpm. Exam Location:  Inpatient Procedure: 2D Echo Indications:    dyspnea.  History:        Patient has  prior history of Echocardiogram examinations, most                 recent 08/29/2020. CHF, Parkinson's; Risk Factors:Hypertension.  Sonographer:    Delcie Roch Referring Phys: [id number] DAYNA N DUNN  Sonographer Comments: Suboptimal subcostal window. IMPRESSIONS  1. Left ventricular ejection fraction, by estimation, is 55 to 60%. The left ventricle has normal function. The left ventricle has no regional wall motion abnormalities. Left ventricular diastolic parameters are consistent with Grade I diastolic dysfunction (impaired relaxation).  2. Right ventricular systolic function is normal. The right ventricular size is normal. There is normal pulmonary artery systolic pressure.  3. The mitral valve is normal in structure. Trivial mitral valve regurgitation. No evidence of mitral stenosis.  4. The aortic valve is normal in structure. Aortic valve regurgitation is mild. No aortic stenosis is present.  5. Aortic dilatation noted. There is mild dilatation of the ascending aorta, measuring 39 mm. There is mild dilatation at the level of the sinuses of Valsalva, measuring 38 mm.  6. The inferior vena cava is normal in size with greater than 50% respiratory variability, suggesting right atrial pressure of 3 mmHg. FINDINGS  Left Ventricle: Left ventricular ejection fraction, by estimation, is 55 to 60%. The left ventricle has normal function. The left ventricle has no regional wall motion abnormalities. The left ventricular internal cavity size was normal in size. There is  no left ventricular hypertrophy. Left ventricular diastolic parameters are consistent with Grade I diastolic dysfunction (impaired relaxation). Normal left ventricular filling pressure. Right Ventricle: The right ventricular size is normal. No increase in right ventricular wall thickness. Right ventricular systolic function is normal. There is normal pulmonary artery systolic pressure. The tricuspid regurgitant velocity is 2.74 m/s, and  with an assumed  right atrial pressure of 3 mmHg, the estimated right ventricular systolic pressure is 33.0 mmHg. Left Atrium: Left atrial size was normal in size. Right Atrium: Right atrial size was normal in size. Pericardium: There is no evidence of pericardial effusion. Mitral Valve: The mitral valve is normal in structure. Mild mitral annular calcification. Trivial mitral valve regurgitation. No evidence of mitral valve stenosis. Tricuspid Valve: The tricuspid valve is normal in structure. Tricuspid valve regurgitation is trivial. No evidence of tricuspid stenosis. Aortic Valve: The aortic valve is normal in structure. Aortic valve regurgitation is mild. No aortic stenosis is present. Pulmonic Valve: The pulmonic  valve was normal in structure. Pulmonic valve regurgitation is trivial. No evidence of pulmonic stenosis. Aorta: Aortic dilatation noted. There is mild dilatation of the ascending aorta, measuring 39 mm. There is mild dilatation at the level of the sinuses of Valsalva, measuring 38 mm. Venous: The inferior vena cava is normal in size with greater than 50% respiratory variability, suggesting right atrial pressure of 3 mmHg. IAS/Shunts: No atrial level shunt detected by color flow Doppler.  LEFT VENTRICLE PLAX 2D LVIDd:         5.20 cm  Diastology LVIDs:         3.50 cm  LV e' medial:    7.07 cm/s LV PW:         1.00 cm  LV E/e' medial:  7.8 LV IVS:        1.10 cm  LV e' lateral:   10.20 cm/s LVOT diam:     2.20 cm  LV E/e' lateral: 5.4 LV SV:         65 LV SV Index:   29 LVOT Area:     3.80 cm  RIGHT VENTRICLE RV S prime:     12.20 cm/s TAPSE (M-mode): 1.9 cm LEFT ATRIUM             Index LA diam:        4.40 cm 1.94 cm/m LA Vol (A2C):   67.0 ml 29.47 ml/m LA Vol (A4C):   73.2 ml 32.20 ml/m LA Biplane Vol: 74.8 ml 32.90 ml/m  AORTIC VALVE LVOT Vmax:   78.30 cm/s LVOT Vmean:  52.300 cm/s LVOT VTI:    0.171 m  AORTA Ao Root diam: 3.80 cm Ao Asc diam:  3.90 cm MITRAL VALVE               TRICUSPID VALVE MV Area (PHT):  4.21 cm    TR Peak grad:   30.0 mmHg MV Decel Time: 180 msec    TR Vmax:        274.00 cm/s MV E velocity: 54.80 cm/s MV A velocity: 66.40 cm/s  SHUNTS MV E/A ratio:  0.83        Systemic VTI:  0.17 m                            Systemic Diam: 2.20 cm Armanda Magic MD Electronically signed by Armanda Magic MD Signature Date/Time: 08/29/2020/1:08:28 PM    Final

## 2020-09-24 NOTE — Evaluation (Signed)
Physical Therapy Evaluation Patient Details Name: Raymond White MRN: [id number] DOB: May 13, 1951 Today's Date: 09/24/2020   History of Present Illness  70 y.o. male seen in the emergency room today with complaints of a fall down the stairs.  Patient thinks he tripped when his foot got stuck.  Patient is unsure if he lost consciousness. +back pain, numbness/weakness bil LEs. Neurosurgery consult CT and MRI of brain and spine: no acute fractures, +ankylosis, no hematoma,  PMH-LBP with sciatica, CHF, DDD, depression, TIA, HTN, Parkinson's, CVA, urinary incontinence  Clinical Impression   Pt admitted with above diagnosis. Patient lives alone. He reports he falls multiple times per year--sometimes in the same week, this time it's been 3 months since last fall. Relates this fall due to his socks sticking to the non-skid material on his stair treads and his foot drop. Patient limited this date due to 10/10 back pain, yet overall moved with only min assist. Patient likely needs SNF for 24/7 care and return to modified independent, however unclear if he will agree and may progress to point that he can go directly home.  Pt currently with functional limitations due to the deficits listed below (see PT Problem List). Pt will benefit from skilled PT to increase their independence and safety with mobility to allow discharge to the venue listed below.       Follow Up Recommendations SNF (due to lack of 24/7 assist)    Equipment Recommendations  None recommended by PT    Recommendations for Other Services OT consult     Precautions / Restrictions Precautions Precautions: Fall Restrictions Weight Bearing Restrictions: No      Mobility  Bed Mobility Overal bed mobility: Needs Assistance Bed Mobility: Rolling;Sidelying to Sit Rolling: Min assist Sidelying to sit: Min assist       General bed mobility comments: Min assist to flex bil LEs to initiate rolling; pt used bed rail; assist to move LEs  over EOB and pt used rail to raise torso    Transfers Overall transfer level: Needs assistance Equipment used: Rolling walker (2 wheeled) Transfers: Sit to/from Stand Sit to Stand: Min assist         General transfer comment: PT stabilized RW with pt using one hand on RW and one to push off the bed.  Ambulation/Gait Ambulation/Gait assistance: Min guard Gait Distance (Feet): 2 Feet Assistive device: Rolling walker (2 wheeled) Gait Pattern/deviations: Step-to pattern;Decreased stride length     General Gait Details: pivotal steps only; no buckling  Stairs            Wheelchair Mobility    Modified Rankin (Stroke Patients Only)       Balance Overall balance assessment: History of Falls (reports foot catches the tread on the steps multiple times per year (last fell 3 months ago))                                           Pertinent Vitals/Pain Pain Assessment: 0-10 Pain Score: 10-Worst pain ever Pain Location: back Pain Descriptors / Indicators: Constant;Cramping;Grimacing;Guarding;Sharp Pain Intervention(s): Limited activity within patient's tolerance;Monitored during session (planned with RN to be pre-medicated, however pt says he was not given meds)    Home Living Family/patient expects to be discharged to:: Unsure Living Arrangements: Alone Available Help at Discharge: Personal care attendant;Available PRN/intermittently Type of Home: Apartment Home Access: Level entry  Home Layout: Two level;Bed/bath upstairs;1/2 bath on main level Home Equipment: Walker - 4 wheels;Cane - single point;Tub bench;Wheelchair - manual;Bedside commode Additional Comments: caregiver assists with transportation and IADLs    Prior Function Level of Independence: Needs assistance   Gait / Transfers Assistance Needed: Pt reports he uses a RW or a SPC           Hand Dominance        Extremity/Trunk Assessment   Upper Extremity Assessment Upper  Extremity Assessment: Defer to OT evaluation    Lower Extremity Assessment Lower Extremity Assessment: Generalized weakness;RLE deficits/detail;LLE deficits/detail RLE Deficits / Details: in supine AAROM due to degree of pain RLE: Unable to fully assess due to pain RLE Sensation: decreased light touch LLE Deficits / Details: in supine AAROM due to degree of pain LLE: Unable to fully assess due to pain LLE Sensation: decreased light touch    Cervical / Trunk Assessment Cervical / Trunk Assessment: Kyphotic  Communication   Communication: No difficulties  Cognition Arousal/Alertness: Awake/alert Behavior During Therapy: WFL for tasks assessed/performed Overall Cognitive Status: Within Functional Limits for tasks assessed                                 General Comments: cognition not specifically tested      General Comments General comments (skin integrity, edema, etc.): Patient in too much pain for standardized balance assessment. Light use of UE support via RW during transfer    Exercises     Assessment/Plan    PT Assessment Patient needs continued PT services  PT Problem List Decreased strength;Decreased range of motion;Decreased activity tolerance;Decreased balance;Decreased mobility;Decreased cognition;Decreased knowledge of use of DME;Cardiopulmonary status limiting activity;Pain       PT Treatment Interventions DME instruction;Gait training;Stair training;Functional mobility training;Therapeutic activities;Therapeutic exercise;Balance training;Neuromuscular re-education;Patient/family education    PT Goals (Current goals can be found in the Care Plan section)  Acute Rehab PT Goals Patient Stated Goal: decrease back pain PT Goal Formulation: With patient Time For Goal Achievement: 10/08/20 Potential to Achieve Goals: Fair    Frequency Min 3X/week   Barriers to discharge Decreased caregiver support      Co-evaluation               AM-PAC  PT "6 Clicks" Mobility  Outcome Measure Help needed turning from your back to your side while in a flat bed without using bedrails?: A Little Help needed moving from lying on your back to sitting on the side of a flat bed without using bedrails?: A Little Help needed moving to and from a bed to a chair (including a wheelchair)?: A Little Help needed standing up from a chair using your arms (e.g., wheelchair or bedside chair)?: A Little Help needed to walk in hospital room?: A Little Help needed climbing 3-5 steps with a railing? : A Little 6 Click Score: 18    End of Session Equipment Utilized During Treatment: Gait belt;Oxygen Activity Tolerance: Patient limited by pain Patient left: in chair;with call bell/phone within reach;with chair alarm set Nurse Communication: Mobility status PT Visit Diagnosis: Repeated falls (R29.6);Muscle weakness (generalized) (M62.81);Other symptoms and signs involving the nervous system (R29.898)    Time: 0272-5366 PT Time Calculation (min) (ACUTE ONLY): 40 min   Charges:   PT Evaluation $PT Eval Low Complexity: 1 Low PT Treatments $Therapeutic Activity: 23-37 mins         Jerolyn Center, PT Pager [phone]  Scherrie November Danne Scardina 09/24/2020, 1:44 PM

## 2020-09-25 DIAGNOSIS — I10 Essential (primary) hypertension: Secondary | ICD-10-CM

## 2020-09-25 DIAGNOSIS — W19XXXA Unspecified fall, initial encounter: Secondary | ICD-10-CM | POA: Diagnosis not present

## 2020-09-25 DIAGNOSIS — I5032 Chronic diastolic (congestive) heart failure: Secondary | ICD-10-CM | POA: Diagnosis not present

## 2020-09-25 DIAGNOSIS — J9601 Acute respiratory failure with hypoxia: Secondary | ICD-10-CM | POA: Diagnosis not present

## 2020-09-25 LAB — GLUCOSE, CAPILLARY
Glucose-Capillary: 178 mg/dL — ABNORMAL HIGH (ref 70–99)
Glucose-Capillary: 144 mg/dL — ABNORMAL HIGH (ref 70–99)
Glucose-Capillary: 132 mg/dL — ABNORMAL HIGH (ref 70–99)
Glucose-Capillary: 171 mg/dL — ABNORMAL HIGH (ref 70–99)

## 2020-09-25 LAB — MAGNESIUM: Magnesium: 2.2 mg/dL (ref 1.7–2.4)

## 2020-09-25 LAB — COMPREHENSIVE METABOLIC PANEL
ALT: 6 U/L (ref 0–44)
AST: 15 U/L (ref 15–41)
Albumin: 2.8 g/dL — ABNORMAL LOW (ref 3.5–5.0)
Alkaline Phosphatase: 59 U/L (ref 38–126)
Anion gap: 9 (ref 5–15)
BUN: 20 mg/dL (ref 8–23)
CO2: 23 mmol/L (ref 22–32)
Calcium: 8.2 mg/dL — ABNORMAL LOW (ref 8.9–10.3)
Chloride: 107 mmol/L (ref 98–111)
Creatinine, Ser: 0.84 mg/dL (ref 0.61–1.24)
GFR, Estimated: >60 mL/min (ref >60)
Glucose, Bld: 162 mg/dL — ABNORMAL HIGH (ref 70–99)
Potassium: 4 mmol/L (ref 3.5–5.1)
Sodium: 139 mmol/L (ref 135–145)
Total Bilirubin: 0.7 mg/dL (ref 0.3–1.2)
Total Protein: 5.4 g/dL — ABNORMAL LOW (ref 6.5–8.1)

## 2020-09-25 LAB — PHOSPHORUS: Phosphorus: 3.3 mg/dL (ref 2.5–4.6)

## 2020-09-25 LAB — FERRITIN: Ferritin: 332 ng/mL (ref 24–336)

## 2020-09-25 LAB — D-DIMER, QUANTITATIVE: D-Dimer, Quant: 0.44 ug/mL-FEU (ref 0.00–0.50)

## 2020-09-25 LAB — C-REACTIVE PROTEIN: CRP: 1.9 mg/dL — ABNORMAL HIGH (ref <1.0)

## 2020-09-25 NOTE — TOC Initial Note (Signed)
Transition of Care [facility]) - Initial/Assessment Note    Patient Details  Name: Raymond White MRN: [id number] Date of Birth: 05/22/1951  [facility]) CM/SW Contact:    Terrial Rhodes, LCSWA Phone Number: 09/25/2020, 2:57 PM  Clinical Narrative:                  CSW received consult for possible SNF placement at time of discharge. CSW spoke with patient regarding PT recommendation of SNF placement at time of discharge. Patient comes from home alone.Patient expressed understanding of PT recommendation and is agreeable to SNF placement at time of discharge. Patient agreed for CSW to fax out initial referral to [facility] and [facility].Patient reports preference for [facility] .  Patient has received the COVID vaccines. Patient gave CSW permission to speak with his significant other Mandy. No further questions reported at this time. CSW to continue to follow and assist with discharge planning needs.   Expected Discharge Plan: Skilled Nursing Facility Barriers to Discharge: Continued Medical Work up   Patient Goals and CMS Choice Patient states their goals for this hospitalization and ongoing recovery are:: to go to SNF CMS Medicare.gov Compare Post Acute Care list provided to:: Patient Choice offered to / list presented to : Patient  Expected Discharge Plan and Services Expected Discharge Plan: Skilled Nursing Facility       Living arrangements for the past 2 months: Single Family Home                                      Prior Living Arrangements/Services Living arrangements for the past 2 months: Single Family Home Lives with:: Self Patient language and need for interpreter reviewed:: Yes Do you feel safe going back to the place where you live?: No   SNF  Need for Family Participation in Patient Care: Yes (Comment) Care giver support system in place?: Yes (comment)   Criminal Activity/Legal Involvement Pertinent to Current Situation/Hospitalization: No -  Comment as needed  Activities of Daily Living Home Assistive Devices/Equipment: Cane (specify quad or straight),Walker (specify type) ADL Screening (condition at time of admission) Patient's cognitive ability adequate to safely complete daily activities?: Yes Is the patient deaf or have difficulty hearing?: No Does the patient have difficulty seeing, even when wearing glasses/contacts?: Yes Does the patient have difficulty concentrating, remembering, or making decisions?: No Patient able to express need for assistance with ADLs?: Yes Does the patient have difficulty dressing or bathing?: No Independently performs ADLs?: Yes (appropriate for developmental age) Does the patient have difficulty walking or climbing stairs?: Yes Weakness of Legs: Both Weakness of Arms/Hands: Both  Permission Sought/Granted Permission sought to share information with : Case Manager,Family Electrical engineer Permission granted to share information with : Yes, Verbal Permission Granted  Share Information with NAME: Angelica Chessman  Permission granted to share info w AGENCY: SNF  Permission granted to share info w Relationship: significant other  Permission granted to share info w Contact Information: Angelica Chessman [phone]  Emotional Assessment   Attitude/Demeanor/Rapport: Gracious Affect (typically observed): Calm Orientation: : Oriented to Self,Oriented to Place,Oriented to  Time,Oriented to Situation (WDL) Alcohol / Substance Use: Not Applicable Psych Involvement: No (comment)  Admission diagnosis:  Low back pain [M54.50] Trauma [T14.90XA] Fall [W19.XXXA] Rib pain on right side [R07.81] Patient Active Problem List   Diagnosis Date Noted  . Fall at home, initial encounter 09/23/2020  . Acute respiratory failure with  hypoxemia (HCC) 08/27/2020  . Acute on chronic diastolic CHF (congestive heart failure) (HCC) 08/27/2020  . Acute CHF (congestive heart failure) (HCC) 08/27/2020  . Acute  congestive heart failure (HCC) 03/29/2020  . Acute right-sided weakness 03/28/2020  . Dyspnea on exertion 03/28/2020  . Respiratory failure with hypoxia (HCC) 10/22/2018  . Acute respiratory failure with hypoxia (HCC) 10/21/2018  . Influenza A 10/21/2018  . Parkinson disease (HCC) 10/21/2018  . DDD (degenerative disc disease), lumbar 12/15/2017  . Klebsiella cystitis 12/15/2017  . 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  . Chronic heart failure with preserved ejection fraction (HCC) 12/15/2017  . GERD (gastroesophageal reflux disease) 12/15/2017  . Hypertension 12/15/2017  . Depressive disorder 12/15/2017  . Anxiety 12/15/2017   PCP:  Judeth Horn, PA-C Pharmacy:   [facility] - [place], [place] - [address] [place] [address] Phone: [phone] Fax: [phone]     Social Determinants of Health (SDOH) Interventions    Readmission Risk Interventions No flowsheet data found.

## 2020-09-25 NOTE — Progress Notes (Signed)
PROGRESS NOTE                                                                                                                                                                                                             Patient Demographics:    Raymond White, is a 70 y.o. male, DOB - Jul 13, 1951, ZOX:[id number]  Outpatient Primary MD for the patient is Sandi Raveling   Admit date - 09/23/2020   LOS - 2  Chief Complaint  Patient presents with  . Fall       Brief Narrative: Patient is a 70 y.o. male with PMHx of chronic diastolic heart failure, chronic back pain, Parkinson's disease, DM-2, depression, HLD-who presented to the hospital with a 5-day history of weakness/cough/shortness of breath and a mechanical fall down 12 flights of stairs.  He was found to have acute hypoxic respiratory failure due to COVID-19 pneumonia-evaluated by neurosurgery for back pain-and subsequently admitted to the hospitalist service.  See below for further details.   COVID-19 vaccinated status: Vaccinated-J&J vaccine x1  Significant Events: 2/4>> Admit to [facility] for fall-hypoxia due to COVID-19 pneumonia  Significant studies: 1/10>> Echo: EF 55-60% 2/4>> chest x-ray: Bibasilar infiltrates 2/4>> CT chest: Peripheral predominant groundglass opacities-COVID-19 infection 2/4>> CT head: No acute intracranial injury 2/4>> CT C-spine: No cervical spine injury 2/4>> MRI C-spine: No obvious cord lesion/hematoma/epidural hematoma 2/4>> MRI T-spine: Degenerative changes-but no acute fracture/cord lesion/spinal canal compromise 2/4>> MRI LS spine: No acute changes-T12-L1 paracentral disc protrusion 2/5>>Chest x-ray: Bibasilar airspace disease-compatible with Covid 19  COVID-19 medications: Steroids: 2/4>> Remdesivir: 2/4>>  Antibiotics: None  Microbiology data: None  Procedures: None  Consults: Neurosurgery  DVT prophylaxis: heparin  injection 5,000 Units Start: 09/23/20 1600 Place TED hose Start: 09/23/20 1538    Subjective:   Back pain much better-feels comfortable-Down to 3-4 L of oxygen this morning.   Assessment  & Plan :   Acute Hypoxic Resp Failure due to Covid 19 Viral pneumonia: Hypoxia improving-Down to 3-4 L of oxygen-continue steroid/Remdesivir.   If worsens-he has consented to the use of Actemra.  Note-rationale/risk/benefits of Actemra use discussed with patient-he has no history of TB/hepatitis B/chronic diverticulitis-and agrees to the use of Actemra if his hypoxemia worsens  Fever: afebrile O2 requirements:  SpO2: 90 % O2 Flow Rate (L/min): 6 L/min   COVID-19 Labs: Recent  Labs    09/23/20 1639 09/24/20 0425 09/25/20 0303  DDIMER 1.04* 0.52* 0.44  FERRITIN 348* 337* 332  LDH 200*  --   --   CRP 2.6* 3.8* 1.9*       Component Value Date/Time   BNP 95.2 09/23/2020 1639    Recent Labs  Lab 09/23/20 1639  PROCALCITON <0.10    Lab Results  Component Value Date   SARSCOV2NAA POSITIVE (A) 09/23/2020   SARSCOV2NAA NEGATIVE 08/30/2020   SARSCOV2NAA NEGATIVE 08/26/2020   SARSCOV2NAA NEGATIVE 03/31/2020     Prone/Incentive Spirometry: encouraged  incentive spirometry use 3-4/hour.  Mechanical fall-acute on chronic back pain: Doubt syncope-probably has autonomic dysfunction in the setting of known Parkinson's disease.  Pain better controlled after adjustment of his narcotic regimen.  PT/OT recommending SNF-patient agreeable.  Leukopenia/thrombocytopenia: Probably due to COVID-19 infection-appears to have mild chronic thrombocytopenia at baseline.  Watch closely.  Chronic diastolic heart failure: Compensated-no evidence of volume overload-continue Aldactone  CAD: No anginal symptoms-continue antiplatelet/Lipitor-Coreg on hold due to bradycardia.  Nocturnal sinus bradycardia: Asymptomatic-beta-blocker held for now-continue to monitor closely.  Recent TSH normal on 1/8  HTN: BP  stable-  HLD: Continue statin  DM-2 (A1c 5.5 on 2/4): CBG stable-continue SSI  Recent Labs    09/24/20 2103 09/25/20 0745 09/25/20 1206  GLUCAP 152* 178* 144*    Parkinson's disease: Continue Sinemet  Anxiety/depression: Continue Cymbalta-somewhat anxious-due to acute pain-continue BuSpar  BPH: Continue Flomax  Obesity: Estimated body mass index is 30.31 kg/m as calculated from the following:   Height as of this encounter: 6\' 1"  (1.854 m).   Weight as of this encounter: 104.2 kg.     GI prophylaxis: PPI  ABG:    Component Value Date/Time   HCO3 20.6 08/26/2020 1900   TCO2 23 09/23/2020 0829   ACIDBASEDEF 1.0 08/26/2020 1900   O2SAT 99.0 08/26/2020 1900    Vent Settings: N/A    Condition - Stable  Family Communication  : Per patient-he has no family-he requested we call Cecille Po [phone]) his POA only if he is unable to communicate with [place].  Code Status :  Full Code  Diet :  Diet Order            Diet regular Room service appropriate? Yes; Fluid consistency: Thin  Diet effective now                  Disposition Plan  :   Status is: Inpatient  Remains inpatient appropriate because:Inpatient level of care appropriate due to severity of illness   Dispo: The patient is from: Home              Anticipated d/c is to: Home              Anticipated d/c date is: > 3 days              Patient currently is not medically stable to d/c.   Difficult to place patient No  Barriers to discharge: Hypoxia requiring O2 supplementation/complete 5 days of IV Remdesivir  Antimicorbials  :    Anti-infectives (From admission, onward)   Start     Dose/Rate Route Frequency Ordered Stop   09/24/20 1000  remdesivir 100 mg in sodium chloride 0.9 % 100 mL IVPB       "Followed by" Linked Group Details   100 mg 200 mL/hr over 30 Minutes Intravenous Daily 09/23/20 1545 09/28/20 0959   09/23/20 1700  remdesivir 200 mg in sodium chloride  0.9% 250 mL IVPB        "Followed by" Linked Group Details   200 mg 580 mL/hr over 30 Minutes Intravenous Once 09/23/20 1545 09/23/20 1825      Inpatient Medications  Scheduled Meds: . acetaminophen  1,000 mg Oral Q8H  . aspirin  81 mg Oral Daily  . benzonatate  200 mg Oral TID  . busPIRone  10 mg Oral TID  . carbidopa-levodopa  2.5 tablet Oral TID  . cholecalciferol  1,000 Units Oral Daily  . cholestyramine  4 g Oral TID  . clopidogrel  75 mg Oral Daily  . DULoxetine  30 mg Oral BID  . feeding supplement  237 mL Oral BID BM  . heparin  5,000 Units Subcutaneous Q8H  . insulin aspart  0-9 Units Subcutaneous TID WC  . methylPREDNISolone (SOLU-MEDROL) injection  80 mg Intravenous Q12H   Followed by  . [START ON 09/26/2020] predniSONE  50 mg Oral Daily  . mometasone-formoterol  2 puff Inhalation BID  . multivitamin with minerals  1 tablet Oral Daily  . pantoprazole  40 mg Oral Daily  . polyethylene glycol  17 g Oral Daily  . rosuvastatin  40 mg Oral Daily  . senna  2 tablet Oral QHS  . spironolactone  25 mg Oral Daily  . tamsulosin  0.4 mg Oral QHS  . thiamine  100 mg Oral Daily  . vitamin B-12  1,000 mcg Oral Daily   Continuous Infusions: . remdesivir 100 mg in NS 100 mL 100 mg (09/25/20 0942)   PRN Meds:.albuterol, bisacodyl, diphenoxylate-atropine, methocarbamol, morphine injection, ondansetron **OR** ondansetron (ZOFRAN) IV, oxyCODONE   Time Spent in minutes  25     See all Orders from today for further details   Jeoffrey Massed M.D on 09/25/2020 at 2:37 PM  To page go to [email or web address] - use universal password  [facility] -  Office  [phone]    Objective:   Vitals:   09/24/20 1648 09/24/20 1930 09/25/20 0450 09/25/20 1344  BP: 97/63 106/67 113/73 116/79  Pulse: 72 (!) 54 94 66  Resp: 20 20 18 20   Temp: 97.6 F (36.4 C) 97.9 F (36.6 C) 98 F (36.7 C) 97.6 F (36.4 C)  TempSrc: Oral Axillary Oral Oral  SpO2: 90% 92% 91% 90%  Weight:      Height:        Wt  Readings from Last 3 Encounters:  09/23/20 104.2 kg  08/31/20 104.2 kg  06/21/20 104.3 kg     Intake/Output Summary (Last 24 hours) at 09/25/2020 1437 Last data filed at 09/25/2020 1344 Gross per 24 hour  Intake 610 ml  Output --  Net 610 ml     Physical Exam Gen Exam:Alert awake-not in any distress HEENT:atraumatic, normocephalic Chest: B/L clear to auscultation anteriorly CVS:S1S2 regular Abdomen:soft non tender, non distended Extremities:no edema Neurology: Non focal Skin: no rash   Data Review:    CBC Recent Labs  Lab 09/23/20 0818 09/23/20 0829 09/24/20 0746  WBC 2.4*  --  1.5*  HGB 14.3 13.6 14.6  HCT 41.8 40.0 42.2  PLT 89*  --  95*  MCV 98.6  --  98.6  MCH 33.7  --  34.1*  MCHC 34.2  --  34.6  RDW 14.0  --  13.8    Chemistries  Recent Labs  Lab 09/23/20 0818 09/23/20 0829 09/24/20 0425 09/25/20 0303  NA 138 140 140 139  K 3.9 4.0 4.1 4.0  CL 106 105 110  107  CO2 22  --  19* 23  GLUCOSE 101* 96 146* 162*  BUN 8 10 12 20   CREATININE 0.92 0.70 0.74 0.84  CALCIUM 8.5*  --  8.2* 8.2*  MG  --   --  2.1 2.2  AST 21  --  19 15  ALT 14  --  6 6  ALKPHOS 69  --  65 59  BILITOT 1.1  --  0.6 0.7   ------------------------------------------------------------------------------------------------------------------ No results for input(s): CHOL, HDL, LDLCALC, TRIG, CHOLHDL, LDLDIRECT in the last 72 hours.  Lab Results  Component Value Date   HGBA1C 5.5 09/23/2020   ------------------------------------------------------------------------------------------------------------------ No results for input(s): TSH, T4TOTAL, T3FREE, THYROIDAB in the last 72 hours.  Invalid input(s): FREET3 ------------------------------------------------------------------------------------------------------------------ Recent Labs    09/24/20 0425 09/25/20 0303  FERRITIN 337* 332    Coagulation profile Recent Labs  Lab 09/23/20 0818  INR 1.0    Recent Labs     09/24/20 0425 09/25/20 0303  DDIMER 0.52* 0.44    Cardiac Enzymes No results for input(s): CKMB, TROPONINI, MYOGLOBIN in the last 168 hours.  Invalid input(s): CK ------------------------------------------------------------------------------------------------------------------    Component Value Date/Time   BNP 95.2 09/23/2020 1639    Micro Results Recent Results (from the past 240 hour(s))  SARS Coronavirus 2 by RT PCR (hospital order, performed in [facility] hospital lab) Nasopharyngeal Nasopharyngeal Swab     Status: Abnormal   Collection Time: 09/23/20  8:18 AM   Specimen: Nasopharyngeal Swab  Result Value Ref Range Status   SARS Coronavirus 2 POSITIVE (A) NEGATIVE Final    Comment: RESULT CALLED TO, READ BACK BY AND VERIFIED WITH: RN E.BANKS AT 0981 ON 09/23/2020 BY T.SAAD (NOTE) SARS-CoV-2 target nucleic acids are DETECTED  SARS-CoV-2 RNA is generally detectable in upper respiratory specimens  during the acute phase of infection.  Positive results are indicative  of the presence of the identified virus, but do not rule out bacterial infection or co-infection with other pathogens not detected by the test.  Clinical correlation with patient history and  other diagnostic information is necessary to determine patient infection status.  The expected result is negative.  Fact Sheet for Patients:   BoilerBrush.com.cy   Fact Sheet for Healthcare Providers:   https://pope.com/    This test is not yet approved or cleared by the [place] FDA and  has been authorized for detection and/or diagnosis of SARS-CoV-2 by FDA under an Emergency Use Authorization (EUA).  This EUA will remain in effect (meaning  this test can be used) for the duration of  the COVID-19 declaration under Section 564(b)(1) of the Act, 21 U.S.C. section 360-bbb-3(b)(1), unless the authorization is terminated or revoked sooner.  Performed at [facility], [address], [place], [place] [address]     Radiology Reports CT HEAD WO CONTRAST  Result Date: 09/23/2020 CLINICAL DATA:  Minor head trauma EXAM: CT HEAD WITHOUT CONTRAST CT CERVICAL SPINE WITHOUT CONTRAST TECHNIQUE: Multidetector CT imaging of the head and cervical spine was performed following the standard protocol without intravenous contrast. Multiplanar CT image reconstructions of the cervical spine were also generated. COMPARISON:  None. FINDINGS: CT HEAD FINDINGS Brain: No evidence of acute infarction, hemorrhage, hydrocephalus, extra-axial collection or mass lesion/mass effect. Generalized atrophy. Vascular: No hyperdense vessel or unexpected calcification. Skull: Normal. Negative for fracture or focal lesion. Sinuses/Orbits: No acute finding. CT CERVICAL SPINE FINDINGS Alignment: Straightening of the cervical spine. Skull base and vertebrae: No acute fracture or focal bone lesion.  Soft tissues and spinal canal: No prevertebral fluid or swelling. No visible canal hematoma. Disc levels: C4-5 and C5-6 solid arthrodesis. Disc degeneration is diffuse and advanced. 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 There is facet ankylosis at C2-3 on the left. Upper chest: No evidence of injury IMPRESSION: No evidence of acute intracranial or cervical spine injury. Electronically Signed   By: Marnee Spring M.D.   On: 09/23/2020 09:37   CT Angio Chest PE W/Cm &/Or Wo Cm  Result Date: 08/27/2020 CLINICAL DATA:  Chest pain or SOB, pleurisy or effusion suspected Shortness of breath, worse for the past 2 days. EXAM: CT ANGIOGRAPHY CHEST WITH CONTRAST TECHNIQUE: Multidetector CT imaging of the chest was performed using the standard protocol during bolus administration of intravenous contrast. Multiplanar CT image reconstructions and MIPs were obtained to evaluate the vascular anatomy. CONTRAST:  OMNIPAQUE IOHEXOL 350 MG/ML SOLN COMPARISON:  Radiograph earlier today.  Chest CTA 06/24/2019  FINDINGS: Cardiovascular: Motion artifact particularly through the lung bases. Allowing for motion artifact, no evidence of acute pulmonary embolus. Aortic atherosclerosis and tortuosity. No dissection or acute aortic findings. Common origin of the brachiocephalic and left common carotid artery, variant arch anatomy. Heart is normal in size. There are coronary artery calcifications. No pericardial effusion. Mediastinum/Nodes: Small mediastinal and hilar lymph nodes not enlarged by size criteria. Small hiatal hernia. Upper esophagus is patulous. No suspicious thyroid nodule. Lungs/Pleura: Chronic elevation of the right hemidiaphragm with postsurgical or remote posttraumatic change of right ribs and chronic basilar consolidation, hyperdensity abutting the hemidiaphragm may be secondary to prior talc pleurodesis. Findings are stable from prior exam. Chronic atelectasis in the dependent right lower lobe and right middle lobe. No evidence of pneumonia or acute airspace disease. No pleural fluid. No findings of pulmonary edema. Trachea and central bronchi are patent. Upper Abdomen: Cholecystectomy.  No acute upper abdominal findings. Musculoskeletal: Postsurgical versus remote posttraumatic change of lower right ribs. Partial ankylosis at the thoracolumbar junction, chronic. Diffuse degenerative change in the thoracic spine. Review of the MIP images confirms the above findings. IMPRESSION: 1. No acute pulmonary embolus or acute intrathoracic abnormality. 2. Chronic elevation of the right hemidiaphragm with postsurgical or remote posttraumatic change of right ribs and chronic basilar consolidation. Chronic hyperdensity abutting the hemidiaphragm may be secondary to prior talc pleurodesis. Chronic atelectasis in the dependent right lower lobe and right middle lobe. 3. Coronary artery calcifications. Aortic Atherosclerosis (ICD10-I70.0). Electronically Signed   By: Narda Rutherford M.D.   On: 08/27/2020 00:02   CT CERVICAL  SPINE WO CONTRAST  Result Date: 09/23/2020 CLINICAL DATA:  Minor head trauma EXAM: CT HEAD WITHOUT CONTRAST CT CERVICAL SPINE WITHOUT CONTRAST TECHNIQUE: Multidetector CT imaging of the head and cervical spine was performed following the standard protocol without intravenous contrast. Multiplanar CT image reconstructions of the cervical spine were also generated. COMPARISON:  None. FINDINGS: CT HEAD FINDINGS Brain: No evidence of acute infarction, hemorrhage, hydrocephalus, extra-axial collection or mass lesion/mass effect. Generalized atrophy. Vascular: No hyperdense vessel or unexpected calcification. Skull: Normal. Negative for fracture or focal lesion. Sinuses/Orbits: No acute finding. CT CERVICAL SPINE FINDINGS Alignment: Straightening of the cervical spine. Skull base and vertebrae: No acute fracture or focal bone lesion. Soft tissues and spinal canal: No prevertebral fluid or swelling. No visible canal hematoma. Disc levels: C4-5 and C5-6 solid arthrodesis. Disc degeneration is diffuse and advanced. 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 There is facet ankylosis at C2-3 on the left. Upper chest: No evidence of injury IMPRESSION: No evidence  of acute intracranial or cervical spine injury. Electronically Signed   By: Marnee Spring M.D.   On: 09/23/2020 09:37   MR Cervical Spine Wo Contrast  Result Date: 09/23/2020 CLINICAL DATA:  Multiple trauma.  Fell downstairs. EXAM: MRI CERVICAL, THORACIC AND LUMBAR SPINE WITHOUT CONTRAST TECHNIQUE: Multiplanar and multiecho pulse sequences of the cervical spine, to include the craniocervical junction and cervicothoracic junction, and thoracic and lumbar spine, were obtained without intravenous contrast. COMPARISON:  CT scans, same date. FINDINGS: MRI CERVICAL SPINE FINDINGS Exam is quite limited due to patient motion. Alignment: Normal Vertebrae: No acute bony findings. No definite fracture or bone lesion. Cord: Grossly normal.  No obvious cord lesion or  cord hematoma. Posterior Fossa, vertebral arteries, paraspinal tissues: No significant findings. Disc levels: Multilevel disc disease and facet disease but no significant canal compromise or canal hematoma. MRI THORACIC SPINE FINDINGS Alignment: Scoliosis but normal overall alignment in the sagittal plane. Vertebrae: No acute thoracic spine fractures are identified. The facets are normally aligned. No facet fractures. Cord: No obvious cord lesions or cord hematoma. No intraspinal compressive hematoma. Paraspinal and other soft tissues: No significant findings. Disc levels: Multilevel disc disease and facet disease. Shallow multilevel disc protrusions and areas of osteophytic ridging but no significant canal compromise. MRI LUMBAR SPINE FINDINGS Segmentation: There are five lumbar type vertebral bodies. The last full intervertebral disc space is labeled L5-S1. Alignment: Degenerative lumbar spondylosis with multilevel degenerative listhesis. Vertebrae: No acute bony findings. No fracture or worrisome bone lesion. Conus medullaris and cauda equina: Conus extends to the L1. level. Conus and cauda equina appear normal. Paraspinal and other soft tissues: No significant paraspinal or retroperitoneal findings. Right renal cyst is noted. Disc levels: T12-L1: Focal right paracentral disc protrusion with mass effect on the thecal sac potentially involving the right L1 nerve root. No spinal stenosis. L1-2: No significant findings. L2-3: Wide decompressive laminectomy. Degenerative retrolisthesis of L2 compared to L3 but no significant spinal or lateral recess stenosis. Mild foraminal narrowing bilaterally. L3-4: Wide decompressive laminectomy. No significant spinal or foraminal stenosis. L4-5: Wide decompressive laminectomy. Mild bilateral foraminal stenosis but no significant spinal stenosis. Mild right lateral recess stenosis. L5-S1: Mild bilateral foraminal stenosis but no spinal or lateral recess stenosis. Advanced facet  disease. IMPRESSION: 1. Very limited MR examination of the cervical spine but no obvious cord lesion, cord hematoma or epidural hematoma. 2. Degenerative changes involving the thoracic spine but no acute fracture, cord lesion or spinal canal compromise. 3. Focal right paracentral disc protrusion at T12-L1 potentially involving the right L1 nerve root. 4. Advanced degenerative changes involving the lumbar spine but no significant canal compromise. 5. Postoperative changes involving the lumbar spine with wide decompressive laminectomies. Electronically Signed   By: Rudie Meyer M.D.   On: 09/23/2020 13:21   MR THORACIC SPINE WO CONTRAST  Result Date: 09/23/2020 CLINICAL DATA:  Multiple trauma.  Fell downstairs. EXAM: MRI CERVICAL, THORACIC AND LUMBAR SPINE WITHOUT CONTRAST TECHNIQUE: Multiplanar and multiecho pulse sequences of the cervical spine, to include the craniocervical junction and cervicothoracic junction, and thoracic and lumbar spine, were obtained without intravenous contrast. COMPARISON:  CT scans, same date. FINDINGS: MRI CERVICAL SPINE FINDINGS Exam is quite limited due to patient motion. Alignment: Normal Vertebrae: No acute bony findings. No definite fracture or bone lesion. Cord: Grossly normal.  No obvious cord lesion or cord hematoma. Posterior Fossa, vertebral arteries, paraspinal tissues: No significant findings. Disc levels: Multilevel disc disease and facet disease but no significant canal compromise or canal  hematoma. MRI THORACIC SPINE FINDINGS Alignment: Scoliosis but normal overall alignment in the sagittal plane. Vertebrae: No acute thoracic spine fractures are identified. The facets are normally aligned. No facet fractures. Cord: No obvious cord lesions or cord hematoma. No intraspinal compressive hematoma. Paraspinal and other soft tissues: No significant findings. Disc levels: Multilevel disc disease and facet disease. Shallow multilevel disc protrusions and areas of osteophytic  ridging but no significant canal compromise. MRI LUMBAR SPINE FINDINGS Segmentation: There are five lumbar type vertebral bodies. The last full intervertebral disc space is labeled L5-S1. Alignment: Degenerative lumbar spondylosis with multilevel degenerative listhesis. Vertebrae: No acute bony findings. No fracture or worrisome bone lesion. Conus medullaris and cauda equina: Conus extends to the L1. level. Conus and cauda equina appear normal. Paraspinal and other soft tissues: No significant paraspinal or retroperitoneal findings. Right renal cyst is noted. Disc levels: T12-L1: Focal right paracentral disc protrusion with mass effect on the thecal sac potentially involving the right L1 nerve root. No spinal stenosis. L1-2: No significant findings. L2-3: Wide decompressive laminectomy. Degenerative retrolisthesis of L2 compared to L3 but no significant spinal or lateral recess stenosis. Mild foraminal narrowing bilaterally. L3-4: Wide decompressive laminectomy. No significant spinal or foraminal stenosis. L4-5: Wide decompressive laminectomy. Mild bilateral foraminal stenosis but no significant spinal stenosis. Mild right lateral recess stenosis. L5-S1: Mild bilateral foraminal stenosis but no spinal or lateral recess stenosis. Advanced facet disease. IMPRESSION: 1. Very limited MR examination of the cervical spine but no obvious cord lesion, cord hematoma or epidural hematoma. 2. Degenerative changes involving the thoracic spine but no acute fracture, cord lesion or spinal canal compromise. 3. Focal right paracentral disc protrusion at T12-L1 potentially involving the right L1 nerve root. 4. Advanced degenerative changes involving the lumbar spine but no significant canal compromise. 5. Postoperative changes involving the lumbar spine with wide decompressive laminectomies. Electronically Signed   By: Rudie Meyer M.D.   On: 09/23/2020 13:21   MR THORACIC SPINE WO CONTRAST  Result Date: 08/30/2020 CLINICAL  DATA:  Recent fall. Fecal incontinence with lower extremity numbness. EXAM: MRI THORACIC SPINE WITHOUT CONTRAST TECHNIQUE: Multiplanar, multisequence MR imaging of the thoracic spine was performed. No intravenous contrast was administered. COMPARISON:  None. FINDINGS: Alignment:  Physiologic. Vertebrae: There is edema within the proximal left ribs and the left lamina and pars interarticularis of T8 and T9. Edema in the right laminae is present to a lesser extent. No acute fracture. Cord:  Normal signal and morphology. Paraspinal and other soft tissues: Negative. Disc levels: There are small disc protrusions at all levels without spinal canal stenosis. There is posterior vertebral body fusion at T11-12. IMPRESSION: 1. Edema within the proximal left ribs and the left posterior elements of T8 and T9. This could indicate fracture or contusion. CT of the thoracic spine may be helpful further assessment. 2. Small disc protrusions at all levels without spinal canal stenosis. Electronically Signed   By: Deatra Robinson M.D.   On: 08/30/2020 22:50   MR LUMBAR SPINE WO CONTRAST  Result Date: 09/23/2020 CLINICAL DATA:  Multiple trauma.  Fell downstairs. EXAM: MRI CERVICAL, THORACIC AND LUMBAR SPINE WITHOUT CONTRAST TECHNIQUE: Multiplanar and multiecho pulse sequences of the cervical spine, to include the craniocervical junction and cervicothoracic junction, and thoracic and lumbar spine, were obtained without intravenous contrast. COMPARISON:  CT scans, same date. FINDINGS: MRI CERVICAL SPINE FINDINGS Exam is quite limited due to patient motion. Alignment: Normal Vertebrae: No acute bony findings. No definite fracture or bone lesion. Cord: Grossly  normal.  No obvious cord lesion or cord hematoma. Posterior Fossa, vertebral arteries, paraspinal tissues: No significant findings. Disc levels: Multilevel disc disease and facet disease but no significant canal compromise or canal hematoma. MRI THORACIC SPINE FINDINGS Alignment:  Scoliosis but normal overall alignment in the sagittal plane. Vertebrae: No acute thoracic spine fractures are identified. The facets are normally aligned. No facet fractures. Cord: No obvious cord lesions or cord hematoma. No intraspinal compressive hematoma. Paraspinal and other soft tissues: No significant findings. Disc levels: Multilevel disc disease and facet disease. Shallow multilevel disc protrusions and areas of osteophytic ridging but no significant canal compromise. MRI LUMBAR SPINE FINDINGS Segmentation: There are five lumbar type vertebral bodies. The last full intervertebral disc space is labeled L5-S1. Alignment: Degenerative lumbar spondylosis with multilevel degenerative listhesis. Vertebrae: No acute bony findings. No fracture or worrisome bone lesion. Conus medullaris and cauda equina: Conus extends to the L1. level. Conus and cauda equina appear normal. Paraspinal and other soft tissues: No significant paraspinal or retroperitoneal findings. Right renal cyst is noted. Disc levels: T12-L1: Focal right paracentral disc protrusion with mass effect on the thecal sac potentially involving the right L1 nerve root. No spinal stenosis. L1-2: No significant findings. L2-3: Wide decompressive laminectomy. Degenerative retrolisthesis of L2 compared to L3 but no significant spinal or lateral recess stenosis. Mild foraminal narrowing bilaterally. L3-4: Wide decompressive laminectomy. No significant spinal or foraminal stenosis. L4-5: Wide decompressive laminectomy. Mild bilateral foraminal stenosis but no significant spinal stenosis. Mild right lateral recess stenosis. L5-S1: Mild bilateral foraminal stenosis but no spinal or lateral recess stenosis. Advanced facet disease. IMPRESSION: 1. Very limited MR examination of the cervical spine but no obvious cord lesion, cord hematoma or epidural hematoma. 2. Degenerative changes involving the thoracic spine but no acute fracture, cord lesion or spinal canal  compromise. 3. Focal right paracentral disc protrusion at T12-L1 potentially involving the right L1 nerve root. 4. Advanced degenerative changes involving the lumbar spine but no significant canal compromise. 5. Postoperative changes involving the lumbar spine with wide decompressive laminectomies. Electronically Signed   By: Rudie Meyer M.D.   On: 09/23/2020 13:21   MR LUMBAR SPINE WO CONTRAST  Result Date: 08/26/2020 CLINICAL DATA:  Initial evaluation for acute low back pain, cauda equina syndrome suspected. EXAM: MRI LUMBAR SPINE WITHOUT CONTRAST TECHNIQUE: Multiplanar, multisequence MR imaging of the lumbar spine was performed. No intravenous contrast was administered. COMPARISON:  Previous MRI from 06/21/2020 FINDINGS: Segmentation:  Examination degraded by motion artifact. Standard segmentation. Lowest well-formed disc space labeled the L5-S1 level. Alignment: Mild retrolisthesis of L2 on L3 and L3 on L4, stable. Underlying dextroscoliosis. Appearance is stable from previous. Vertebrae: Vertebral body height maintained without acute or interval fracture. T11 and T12 vertebral bodies are partially ankylosed posteriorly. Bone marrow signal intensity diffusely heterogeneous but within normal limits. No worrisome osseous lesions. Discogenic reactive endplate change present throughout the lumbar spine, most pronounced at L4-5 on the right. Conus medullaris and cauda equina: Conus extends to the L1 level. Conus and cauda equina appear normal. Paraspinal and other soft tissues: Chronic postoperative changes present within the posterior paraspinous soft tissues. Approximate 1 cm simple exophytic cyst noted extending from the interpolar right kidney. Additional 4.6 cm simple cyst present at the lower pole of the right kidney. Extrarenal pelvises noted bilaterally. Visualized visceral structures otherwise unremarkable. Disc levels: T12-L1: Mild disc bulge with disc desiccation. Superimposed small right subarticular  disc protrusion indents the right ventral thecal sac (series 8, image 5). Mild  facet hypertrophy. No significant spinal stenosis. Foramina remain patent. L1-2: Degenerative intervertebral disc space narrowing with mild disc bulge. Discogenic reactive endplate change. Mild bilateral facet hypertrophy. No significant spinal stenosis. Foramina remain patent. L2-3: Retrolisthesis. Advanced degenerative intervertebral disc space narrowing with diffuse disc bulge, asymmetric to the left. Associated prominent reactive endplate change with marginal endplate osteophytic spurring. Prior posterior decompression. Residual mild facet hypertrophy. No significant spinal stenosis. Mild to moderate bilateral foraminal narrowing. Appearance is stable. L3-4: Retrolisthesis. Advanced degenerative intervertebral disc space narrowing with diffuse disc bulge. Associated discogenic reactive endplate change with marginal endplate osteophytic spurring, greater on the left. Prior posterior decompression. Residual right greater than left facet hypertrophy. Stable thecal sac patency without significant spinal stenosis. Mild to moderate left greater than right L3 foraminal narrowing, stable. L4-5: Degenerative intervertebral disc space narrowing with diffuse disc bulge and disc desiccation. Associated reactive endplate changes. Superimposed small central to right subarticular disc protrusion with inferior migration (series 3, image 10), stable from previous. Protruding disc closely approximates the right greater than left descending L5 nerve roots, either which could be affected. The right L5 nerve root is slightly displaced posteriorly. Prior posterior decompression. Residual moderate facet hypertrophy. Mild to moderate bilateral lateral recess narrowing without significant spinal stenosis. Severe right with moderate left L4 foraminal narrowing. Appearance is relatively stable. L5-S1: Diffuse disc bulge with disc desiccation and intervertebral  disc space narrowing. Associated reactive endplate spurring. Superimposed small central disc protrusion with slight inferior migration. Mild to moderate bilateral facet hypertrophy. Mild narrowing of the lateral recesses bilaterally. Central canal remains patent. 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 Appearance is stable. IMPRESSION: 1. No evidence for cord compression or other acute abnormality within the lumbar spine. Relatively stable appearance as compared to most recent MRI from 06/21/2020. Stable severe multilevel degenerative spondylosis with severe foraminal stenosis on the right at L4-5 and bilaterally at L5-S1. 2. More moderate foraminal narrowing bilaterally at L3-4 and on the left at L4-5. 3. Central to right subarticular disc protrusion at L4-5, potentially irritating either of the descending L5 nerve roots, right greater than left. Electronically Signed   By: Rise Mu M.D.   On: 08/26/2020 22:45   DG Pelvis Portable  Result Date: 09/23/2020 CLINICAL DATA:  Fall, pelvic pain EXAM: PORTABLE PELVIS 1-2 VIEWS COMPARISON:  None. FINDINGS: There is no evidence of pelvic fracture or diastasis. No pelvic bone lesions are seen. Degenerative changes are seen within the a lumbosacral junction, not well assessed on this exam. IMPRESSION: Negative. Electronically Signed   By: Helyn Numbers MD   On: 09/23/2020 08:31   CT CHEST ABDOMEN PELVIS W CONTRAST  Result Date: 09/23/2020 CLINICAL DATA:  Fall down steps EXAM: CT CHEST, ABDOMEN, AND PELVIS WITH CONTRAST TECHNIQUE: Multidetector CT imaging of the chest, abdomen and pelvis was performed following the standard protocol during bolus administration of intravenous contrast. CONTRAST:  OMNIPAQUE IOHEXOL 300 MG/ML  SOLN COMPARISON:  August 26, 2020 FINDINGS: CT CHEST FINDINGS Cardiovascular: No evidence of acute aortic injury with limited evaluation of the aortic root secondary to cardiac motion. LEFT-sided coronary artery  atherosclerotic calcifications heart is mildly enlarged. No pericardial effusion. Scattered atherosclerotic calcifications throughout the aorta. Mediastinum/Nodes: Thyroid is unremarkable. No suspicious axillary or mediastinal lymph nodes. Lungs/Pleura: No pleural effusion or pneumothorax. Revisualization of elevation of the RIGHT hemidiaphragm with chronic calcific densities and scarring overlying the RIGHT hemidiaphragm. There are new peripheral predominant ground-glass opacities throughout bilateral lungs. Musculoskeletal: Remote RIGHT-sided rib fractures. There is fat  stranding overlying the RIGHT lateral soft tissues, partially excluded from the field of view. No evidence of active contrast extravasation. Multilevel degenerative changes of the thoracic spine better assessed on contemporaneous dedicated CT. CT ABDOMEN PELVIS FINDINGS Hepatobiliary: Status post cholecystectomy. Unchanged central biliary prominence, likely due to post cholecystectomy state. No evidence of hepatic laceration. Pancreas: No peripancreatic fat stranding. Spleen: No splenic injury or perisplenic hematoma. Adrenals/Urinary Tract: Adrenal glands are unremarkable. Kidneys enhance symmetrically. No evidence of renal laceration. RIGHT-sided renal cysts. Additional subcentimeter hypodense lesions are too small to accurately characterize. Bladder is decompressed with circumferentially thickened walls; this likely reflects a sequela of chronic outlet obstruction. Stomach/Bowel: Hiatal hernia with a LEFT-sided esophageal diverticulum, unchanged. No evidence of bowel obstruction. Scattered diverticulosis. Vascular/Lymphatic: Mild atherosclerotic calcifications of the aorta. No suspicious lymphadenopathy. 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 Other: Fat containing LEFT inguinal hernia.  No free fluid. Musculoskeletal: Multilevel degenerative changes of the lumbar spine better assessed on contemporaneous CT. IMPRESSION: 1. No  evidence of solid organ or aortic injury. Fat stranding along the RIGHT lateral chest wall and flank likely reflects contusion/bruising. 2. Peripheral predominant ground-glass opacities likely reflect atypical infection such as COVID 19 infection. 3. Please see separately dictated report regarding thoracolumbar spine CT. Aortic Atherosclerosis (ICD10-I70.0). Electronically Signed   By: Meda KlinefelterStephanie  Peacock MD   On: 09/23/2020 10:13   CT T-SPINE NO CHARGE  Result Date: 09/23/2020 CLINICAL DATA:  Larey SeatFell downstairs.  Back pain. EXAM: CT THORACIC AND LUMBAR SPINE WITHOUT CONTRAST TECHNIQUE: Multidetector CT imaging of the thoracic and lumbar spine was performed without contrast. Multiplanar CT image reconstructions were also generated. COMPARISON:  MRI thoracic spine 08/30/2020 and MRI lumbar spine 08/26/2020. FINDINGS: CT THORACIC SPINE FINDINGS Alignment: Stable right convex thoracic scoliosis. Normal overall alignment of the thoracic vertebral bodies in the sagittal plane. Vertebrae: No acute thoracic spine fracture is identified. Stable degenerative changes. The facets are normally aligned. Paraspinal and other soft tissues: No significant paraspinal findings. Dependent bibasilar atelectasis is noted. No mediastinal mass or hematoma. No paraspinal hematoma. The visualized posterior ribs are intact. Disc levels: No large disc protrusions, significant spinal or foraminal stenosis. No intraspinal hematoma. CT LUMBAR SPINE FINDINGS Segmentation: There are five lumbar type vertebral bodies. The last full intervertebral disc space is labeled L5-S1. Alignment: Normal overall alignment. 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 Vertebrae: No acute lumbar spine fracture. Postoperative changes with wide decompressive laminectomies. No worrisome bone lesions. Paraspinal and other soft tissues: No significant paraspinal or retroperitoneal findings. Moderate aortic calcifications but  no aneurysm. A right renal cyst is noted. Disc levels: Decompressive laminectomies without significant canal stenosis. No hematoma or abnormal fluid collections. The upper sacrum is intact and the SI joints are intact. IMPRESSION: 1. No acute thoracic or lumbar spine fracture is identified. 2. Stable right convex thoracic scoliosis. 3. 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 4. Postoperative changes involving the lumbar spine with wide decompressive laminectomies. Aortic Atherosclerosis (ICD10-I70.0). Electronically Signed   By: Rudie MeyerP.  Gallerani M.D.   On: 09/23/2020 10:08   CT L-SPINE NO CHARGE  Result Date: 09/23/2020 CLINICAL DATA:  Larey SeatFell downstairs.  Back pain. EXAM: CT THORACIC AND LUMBAR SPINE WITHOUT CONTRAST TECHNIQUE: Multidetector CT imaging of the thoracic and lumbar spine was performed without contrast. Multiplanar CT image reconstructions were also generated. COMPARISON:  MRI thoracic spine 08/30/2020 and MRI lumbar spine 08/26/2020. FINDINGS: CT THORACIC SPINE FINDINGS Alignment: Stable right convex thoracic scoliosis. Normal  overall alignment of the thoracic vertebral bodies in the sagittal plane. Vertebrae: No acute thoracic spine fracture is identified. Stable degenerative changes. The facets are normally aligned. Paraspinal and other soft tissues: No significant paraspinal findings. Dependent bibasilar atelectasis is noted. No mediastinal mass or hematoma. No paraspinal hematoma. The visualized posterior ribs are intact. Disc levels: No large disc protrusions, significant spinal or foraminal stenosis. No intraspinal hematoma. CT LUMBAR SPINE FINDINGS Segmentation: There are five lumbar type vertebral bodies. The last full intervertebral disc space is labeled L5-S1. Alignment: Normal overall alignment. 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 Vertebrae: No acute lumbar spine fracture. Postoperative changes  with wide decompressive laminectomies. No worrisome bone lesions. Paraspinal and other soft tissues: No significant paraspinal or retroperitoneal findings. Moderate aortic calcifications but no aneurysm. A right renal cyst is noted. Disc levels: Decompressive laminectomies without significant canal stenosis. No hematoma or abnormal fluid collections. The upper sacrum is intact and the SI joints are intact. IMPRESSION: 1. No acute thoracic or lumbar spine fracture is identified. 2. Stable right convex thoracic scoliosis. 3. 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 4. Postoperative changes involving the lumbar spine with wide decompressive laminectomies. Aortic Atherosclerosis (ICD10-I70.0). Electronically Signed   By: Rudie Meyer M.D.   On: 09/23/2020 10:08   DG Chest Port 1 View  Result Date: 09/23/2020 CLINICAL DATA:  Chest pain after fall. EXAM: PORTABLE CHEST 1 VIEW COMPARISON:  August 28, 2020. FINDINGS: The heart size and mediastinal contours are within normal limits. No pneumothorax is noted. Mild bibasilar subsegmental atelectasis or infiltrates are noted with small right pleural effusion. The visualized skeletal structures are unremarkable. IMPRESSION: Mild bibasilar subsegmental atelectasis or infiltrates are noted with small right pleural effusion. Electronically Signed   By: Lupita Raider M.D.   On: 09/23/2020 08:41   DG Chest Port 1 View  Result Date: 08/28/2020 CLINICAL DATA:  Hypoxia EXAM: PORTABLE CHEST 1 VIEW COMPARISON:  August 26, 2020 FINDINGS: The cardiomediastinal silhouette is stable. No pneumothorax. Right basilar atelectasis, unchanged. Elevation right hemidiaphragm identified. Mild opacity in the left base is new in the interval, favored to represent atelectasis. Air under the right hemidiaphragm is noted to be interposed colon seen on previous CT imaging. No other acute abnormalities. IMPRESSION: 1. New mild opacity in the left base,  favored to represent atelectasis. 2. Stable right basilar atelectasis and elevation of the right hemidiaphragm. Electronically Signed   By: Gerome Sam III M.D   On: 08/28/2020 19:17   DG Chest Port 1V same Day  Result Date: 09/24/2020 CLINICAL DATA:  Shortness of breath.  COVID positive.  Fall at home. EXAM: PORTABLE CHEST 1 VIEW COMPARISON:  One-view chest x-ray 09/23/2020 FINDINGS: Heart is enlarged. Bibasilar airspace opacities are again seen, slightly increased bilaterally. Bilateral effusions are present. IMPRESSION: 1. Slight increase in bibasilar airspace opacities compatible COVID 19 infection. 2. Stable bilateral pleural effusions. Electronically Signed   By: Marin Roberts M.D.   On: 09/24/2020 11:36   ECHOCARDIOGRAM COMPLETE  Result Date: 08/29/2020    ECHOCARDIOGRAM REPORT   Patient Name:   RANDAL GOENS Date of Exam: 08/29/2020 Medical Rec #:  [id number]         Height:       73.0 in Accession #:    [id number]        Weight:       227.7 lb Date of Birth:  Jan 03, 1951  BSA:          2.274 m Patient Age:    69 years          BP:           102/59 mmHg Patient Gender: M                 HR:           64 bpm. Exam Location:  Inpatient Procedure: 2D Echo Indications:    dyspnea.  History:        Patient has prior history of Echocardiogram examinations, most                 recent 08/29/2020. CHF, Parkinson's; Risk Factors:Hypertension.  Sonographer:    Delcie Roch Referring Phys: [id number] DAYNA N DUNN  Sonographer Comments: Suboptimal subcostal window. IMPRESSIONS  1. Left ventricular ejection fraction, by estimation, is 55 to 60%. The left ventricle has normal function. The left ventricle has no regional wall motion abnormalities. Left ventricular diastolic parameters are consistent with Grade I diastolic dysfunction (impaired relaxation).  2. Right ventricular systolic function is normal. The right ventricular size is normal. There is normal pulmonary artery systolic pressure.  3.  The mitral valve is normal in structure. Trivial mitral valve regurgitation. No evidence of mitral stenosis.  4. The aortic valve is normal in structure. Aortic valve regurgitation is mild. No aortic stenosis is present.  5. Aortic dilatation noted. There is mild dilatation of the ascending aorta, measuring 39 mm. There is mild dilatation at the level of the sinuses of Valsalva, measuring 38 mm.  6. The inferior vena cava is normal in size with greater than 50% respiratory variability, suggesting right atrial pressure of 3 mmHg. FINDINGS  Left Ventricle: Left ventricular ejection fraction, by estimation, is 55 to 60%. The left ventricle has normal function. The left ventricle has no regional wall motion abnormalities. The left ventricular internal cavity size was normal in size. There is  no left ventricular hypertrophy. Left ventricular diastolic parameters are consistent with Grade I diastolic dysfunction (impaired relaxation). Normal left ventricular filling pressure. Right Ventricle: The right ventricular size is normal. No increase in right ventricular wall thickness. Right ventricular systolic function is normal. There is normal pulmonary artery systolic pressure. The tricuspid regurgitant velocity is 2.74 m/s, and  with an assumed right atrial pressure of 3 mmHg, the estimated right ventricular systolic pressure is 33.0 mmHg. Left Atrium: Left atrial size was normal in size. Right Atrium: Right atrial size was normal in size. Pericardium: There is no evidence of pericardial effusion. Mitral Valve: The mitral valve is normal in structure. Mild mitral annular calcification. Trivial mitral valve regurgitation. No evidence of mitral valve stenosis. Tricuspid Valve: The tricuspid valve is normal in structure. Tricuspid valve regurgitation is trivial. No evidence of tricuspid stenosis. Aortic Valve: The aortic valve is normal in structure. Aortic valve regurgitation is mild. No aortic stenosis is present. Pulmonic  Valve: The pulmonic valve was normal in structure. Pulmonic valve regurgitation is trivial. No evidence of pulmonic stenosis. Aorta: Aortic dilatation noted. There is mild dilatation of the ascending aorta, measuring 39 mm. There is mild dilatation at the level of the sinuses of Valsalva, measuring 38 mm. Venous: The inferior vena cava is normal in size with greater than 50% respiratory variability, suggesting right atrial pressure of 3 mmHg. IAS/Shunts: No atrial level shunt detected by color flow Doppler.  LEFT VENTRICLE PLAX 2D LVIDd:         5.20  cm  Diastology LVIDs:         3.50 cm  LV e' medial:    7.07 cm/s LV PW:         1.00 cm  LV E/e' medial:  7.8 LV IVS:        1.10 cm  LV e' lateral:   10.20 cm/s LVOT diam:     2.20 cm  LV E/e' lateral: 5.4 LV SV:         65 LV SV Index:   29 LVOT Area:     3.80 cm  RIGHT VENTRICLE RV S prime:     12.20 cm/s TAPSE (M-mode): 1.9 cm LEFT ATRIUM             Index LA diam:        4.40 cm 1.94 cm/m LA Vol (A2C):   67.0 ml 29.47 ml/m LA Vol (A4C):   73.2 ml 32.20 ml/m LA Biplane Vol: 74.8 ml 32.90 ml/m  AORTIC VALVE LVOT Vmax:   78.30 cm/s LVOT Vmean:  52.300 cm/s LVOT VTI:    0.171 m  AORTA Ao Root diam: 3.80 cm Ao Asc diam:  3.90 cm MITRAL VALVE               TRICUSPID VALVE MV Area (PHT): 4.21 cm    TR Peak grad:   30.0 mmHg MV Decel Time: 180 msec    TR Vmax:        274.00 cm/s MV E velocity: 54.80 cm/s MV A velocity: 66.40 cm/s  SHUNTS MV E/A ratio:  0.83        Systemic VTI:  0.17 m                            Systemic Diam: 2.20 cm Armanda Magic MD Electronically signed by Armanda Magic MD Signature Date/Time: 08/29/2020/1:08:28 PM    Final

## 2020-09-25 NOTE — Progress Notes (Signed)
Physical Therapy Treatment Patient Details Name: Raymond White MRN: [id number] DOB: 01-Jan-1951 Today's Date: 09/25/2020    History of Present Illness 70 y.o. male seen in the emergency room today with complaints of a fall down the stairs.  Patient thinks he tripped when his foot got stuck.  Patient is unsure if he lost consciousness. +back pain, numbness/weakness bil LEs. Neurosurgery consult CT and MRI of brain and spine: no acute fractures, +ankylosis, no hematoma,  PMH-LBP with sciatica, CHF, DDD, depression, TIA, HTN, Parkinson's, CVA, urinary incontinence    PT Comments    Patient pre-medicated with Sinemet and morphine prior to session. Resting in supine on 4L O2 with sats 92%; ambulated initially on 4L with sats down to 86%; increased to 6L on portable tank with sats 88% during remainder of walk. On return to chair pt returned to 4L and sats 91%. Patient requiring min guard to min assist for standing and walking. May progress to be able to return home alone by the time he is medically ready for discharge, however continue to recommend SNF at this time due to pt moving very slowly and still requiring morphine for pain control.     Follow Up Recommendations  SNF (due to lack of 24/7 assist)     Equipment Recommendations  None recommended by PT    Recommendations for Other Services OT consult     Precautions / Restrictions Precautions Precautions: Fall Restrictions Weight Bearing Restrictions: No    Mobility  Bed Mobility Overal bed mobility: Needs Assistance Bed Mobility: Rolling;Sidelying to Sit Rolling: Supervision Sidelying to sit: Supervision          Transfers Overall transfer level: Needs assistance Equipment used: Rolling walker (2 wheeled) Transfers: Sit to/from Stand Sit to Stand: Min guard            Ambulation/Gait Ambulation/Gait assistance: Min guard Gait Distance (Feet): 50 Feet Assistive device: Rolling walker (2 wheeled) Gait  Pattern/deviations: Step-to pattern;Decreased stride length;Shuffle     General Gait Details: shuffling gait that pt reports is worse than usual due to pain   Stairs             Wheelchair Mobility    Modified Rankin (Stroke Patients Only)       Balance Overall balance assessment: History of Falls (reports foot catches the tread on the steps multiple times per year (last fell 3 months ago))                                          Cognition Arousal/Alertness: Awake/alert Behavior During Therapy: WFL for tasks assessed/performed Overall Cognitive Status: Within Functional Limits for tasks assessed                                 General Comments: cognition not specifically tested      Exercises      General Comments        Pertinent Vitals/Pain Pain Assessment: 0-10 Pain Score: 7  Pain Location: back Pain Descriptors / Indicators: Constant;Cramping;Grimacing;Guarding;Sharp Pain Intervention(s): Limited activity within patient's tolerance;Monitored during session;Premedicated before session    Home Living                      Prior Function            PT Goals (current goals  can now be found in the care plan section) Acute Rehab PT Goals Patient Stated Goal: decrease back pain PT Goal Formulation: With patient Time For Goal Achievement: 10/08/20 Potential to Achieve Goals: Fair Progress towards PT goals: Progressing toward goals    Frequency    Min 3X/week      PT Plan Current plan remains appropriate    Co-evaluation              AM-PAC PT "6 Clicks" Mobility   Outcome Measure  Help needed turning from your back to your side while in a flat bed without using bedrails?: A Little Help needed moving from lying on your back to sitting on the side of a flat bed without using bedrails?: A Little Help needed moving to and from a bed to a chair (including a wheelchair)?: A Little Help needed standing  up from a chair using your arms (e.g., wheelchair or bedside chair)?: A Little Help needed to walk in hospital room?: A Little Help needed climbing 3-5 steps with a railing? : A Little 6 Click Score: 18    End of Session Equipment Utilized During Treatment: Gait belt;Oxygen Activity Tolerance: Patient limited by pain Patient left: in chair;with call bell/phone within reach;with chair alarm set Nurse Communication: Mobility status PT Visit Diagnosis: Repeated falls (R29.6);Muscle weakness (generalized) (M62.81);Other symptoms and signs involving the nervous system (R29.898)     Time: 3825-0539 PT Time Calculation (min) (ACUTE ONLY): 34 min  Charges:  $Gait Training: 8-22 mins $Therapeutic Activity: 8-22 mins                      Jerolyn Center, PT Pager [phone]    [name] 09/25/2020, 1:31 PM

## 2020-09-25 NOTE — NC FL2 (Signed)
[place] MEDICAID FL2 LEVEL OF CARE SCREENING TOOL     IDENTIFICATION  Patient Name: Raymond White Birthdate: August 08, 1951 Sex: male Admission Date (Current Location): 09/23/2020  [facility] and [place] Number:  Producer, television/film/video and Address:  The . [facility], [address] [place], [place] [address]      Provider Number: [id number]  Attending Physician Name and Address:  Maretta Bees, MD  Relative Name and Phone Number:  Angelica Chessman [phone]    Current Level of Care: Hospital Recommended Level of Care: Skilled Nursing Facility Prior Approval Number:    Date Approved/Denied:   PASRR Number: [id number]  Discharge Plan: SNF    Current Diagnoses: Patient Active Problem List   Diagnosis Date Noted  . Fall at home, initial encounter 09/23/2020  . Acute respiratory failure with hypoxemia (HCC) 08/27/2020  . Acute on chronic diastolic CHF (congestive heart failure) (HCC) 08/27/2020  . Acute CHF (congestive heart failure) (HCC) 08/27/2020  . Acute congestive heart failure (HCC) 03/29/2020  . Acute right-sided weakness 03/28/2020  . Dyspnea on exertion 03/28/2020  . Respiratory failure with hypoxia (HCC) 10/22/2018  . Acute respiratory failure with hypoxia (HCC) 10/21/2018  . Influenza A 10/21/2018  . Parkinson disease (HCC) 10/21/2018  . DDD (degenerative disc disease), lumbar 12/15/2017  . Klebsiella cystitis 12/15/2017  . 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  . Chronic heart failure with preserved ejection fraction (HCC) 12/15/2017  . GERD (gastroesophageal reflux disease) 12/15/2017  . Hypertension 12/15/2017  . Depressive disorder 12/15/2017  . Anxiety 12/15/2017    Orientation RESPIRATION BLADDER Height & Weight     Self,Time,Situation,Place (WDL)  O2 (HFNC 6 liters) Incontinent,External catheter (External Urinary Catheter) Weight: 229 lb 11.5 oz (104.2 kg) Height:  6\' 1"  (185.4 cm)  BEHAVIORAL SYMPTOMS/MOOD NEUROLOGICAL BOWEL NUTRITION  STATUS      Continent (WDL) Diet (See discharge summary)  AMBULATORY STATUS COMMUNICATION OF NEEDS Skin   Limited Assist Verbally Surgical wounds,Other (Comment) (abrasion arm right,ecchymosis arm,back,elbow,leg,right,wound incision open or dehiced non pressure wound pretibial left superficial wound,cleansed)                       Personal Care Assistance Level of Assistance  Bathing,Feeding,Dressing Bathing Assistance: Limited assistance Feeding assistance: Independent (able to feed self; Carb modified;Cardiac) Dressing Assistance: Limited assistance     Functional Limitations Info  Sight,Hearing,Speech Sight Info: Adequate Hearing Info: Adequate Speech Info: Adequate    SPECIAL CARE FACTORS FREQUENCY  PT (By licensed PT),OT (By licensed OT)     PT Frequency: 5x min weekly OT Frequency: 5x min weekly            Contractures Contractures Info: Not present    Additional Factors Info  Code Status,Allergies,Psychotropic,Insulin Sliding Scale Code Status Info: FULL Allergies Info: Fish-derived Products,Zoloft (sertraline Hcl),Sulfamethizole Psychotropic Info: busPIRone (BUSPAR) tablet 10 mg 3 times daily, Insulin Sliding Scale Info: insulin aspart (novoLOG) injection 0-9 Units 3 times daily with meals       Current Medications (09/25/2020):  This is the current hospital active medication list Current Facility-Administered Medications  Medication Dose Route Frequency Provider Last Rate Last Admin  . acetaminophen (TYLENOL) tablet 1,000 mg  1,000 mg Oral Q8H Ghimire, 11/23/2020, MD   1,000 mg at 09/25/20 1448  . albuterol (VENTOLIN HFA) 108 (90 Base) MCG/ACT inhaler 2 puff  2 puff Inhalation Q6H PRN 11/23/20, MD      . aspirin chewable tablet 81 mg  81 mg Oral Daily Gertha Calkin,  Eliezer Mccoy, MD   81 mg at 09/25/20 0947  . benzonatate (TESSALON) capsule 200 mg  200 mg Oral TID Maretta Bees, MD   200 mg at 09/25/20 0947  . bisacodyl (DULCOLAX) EC tablet 5 mg  5 mg Oral Daily  PRN Gertha Calkin, MD      . busPIRone (BUSPAR) tablet 10 mg  10 mg Oral TID Gertha Calkin, MD   10 mg at 09/25/20 0946  . carbidopa-levodopa (SINEMET IR) 25-100 MG per tablet immediate release 2.5 tablet  2.5 tablet Oral TID Gertha Calkin, MD   2.5 tablet at 09/25/20 1246  . cholecalciferol (VITAMIN D3) tablet 1,000 Units  1,000 Units Oral Daily Gertha Calkin, MD   1,000 Units at 09/25/20 [phone]  . cholestyramine (QUESTRAN) packet 4 g  4 g Oral TID Gertha Calkin, MD   4 g at 09/25/20 0945  . clopidogrel (PLAVIX) tablet 75 mg  75 mg Oral Daily Gertha Calkin, MD   75 mg at 09/25/20 0947  . diphenoxylate-atropine (LOMOTIL) 2.5-0.025 MG per tablet 1 tablet  1 tablet Oral TID PRN Gertha Calkin, MD      . DULoxetine (CYMBALTA) DR capsule 30 mg  30 mg Oral BID Gertha Calkin, MD   30 mg at 09/25/20 0947  . feeding supplement (ENSURE ENLIVE / ENSURE PLUS) liquid 237 mL  237 mL Oral BID BM Irena Cords V, MD   237 mL at 09/25/20 0951  . heparin injection 5,000 Units  5,000 Units Subcutaneous Q8H Gertha Calkin, MD   5,000 Units at 09/25/20 1448  . insulin aspart (novoLOG) injection 0-9 Units  0-9 Units Subcutaneous TID WC Gertha Calkin, MD   1 Units at 09/25/20 1245  . methocarbamol (ROBAXIN) tablet 500 mg  500 mg Oral Q8H PRN John Giovanni, MD   500 mg at 09/24/20 2257  . methylPREDNISolone sodium succinate (SOLU-MEDROL) 125 mg/2 mL injection 80 mg  80 mg Intravenous Q12H Maretta Bees, MD   80 mg at 09/25/20 0510   Followed by  . [START ON 09/26/2020] predniSONE (DELTASONE) tablet 50 mg  50 mg Oral Daily Ghimire, Shanker M, MD      . mometasone-formoterol [facility]) 200-5 MCG/ACT inhaler 2 puff  2 puff Inhalation BID Gertha Calkin, MD   2 puff at 09/25/20 0759  . morphine 2 MG/ML injection 2 mg  2 mg Intravenous Q4H PRN Maretta Bees, MD   2 mg at 09/25/20 0942  . multivitamin with minerals tablet 1 tablet  1 tablet Oral Daily Maretta Bees, MD   1 tablet at 09/25/20 [phone]  . ondansetron  (ZOFRAN) tablet 4 mg  4 mg Oral Q6H PRN Gertha Calkin, MD       Or  . ondansetron [facility]) injection 4 mg  4 mg Intravenous Q6H PRN Gertha Calkin, MD      . oxyCODONE (Oxy IR/ROXICODONE) immediate release tablet 10 mg  10 mg Oral Q4H PRN Maretta Bees, MD   10 mg at 09/24/20 1454  . pantoprazole (PROTONIX) EC tablet 40 mg  40 mg Oral Daily Gertha Calkin, MD   40 mg at 09/25/20 0947  . polyethylene glycol (MIRALAX / GLYCOLAX) packet 17 g  17 g Oral Daily Maretta Bees, MD   17 g at 09/25/20 0945  . remdesivir 100 mg in sodium chloride 0.9 % 100 mL IVPB  100 mg Intravenous Daily Gertha Calkin, MD 200  mL/hr at 09/25/20 0942 100 mg at 09/25/20 0942  . rosuvastatin (CRESTOR) tablet 40 mg  40 mg Oral Daily Gertha Calkin, MD   40 mg at 09/25/20 0947  . senna (SENOKOT) tablet 17.2 mg  2 tablet Oral QHS Maretta Bees, MD   17.2 mg at 09/24/20 2200  . spironolactone (ALDACTONE) tablet 25 mg  25 mg Oral Daily Gertha Calkin, MD   25 mg at 09/25/20 0946  . tamsulosin (FLOMAX) capsule 0.4 mg  0.4 mg Oral QHS Irena Cords V, MD   0.4 mg at 09/24/20 2200  . thiamine tablet 100 mg  100 mg Oral Daily Gertha Calkin, MD   100 mg at 09/25/20 0946  . vitamin B-12 (CYANOCOBALAMIN) tablet 1,000 mcg  1,000 mcg Oral Daily Gertha Calkin, MD   1,000 mcg at 09/25/20 1517     Discharge Medications: Please see discharge summary for a list of discharge medications.  Relevant Imaging Results:  Relevant Lab Results:   Additional Information [id number]  Terrial Rhodes, LCSWA

## 2020-09-26 DIAGNOSIS — W19XXXA Unspecified fall, initial encounter: Secondary | ICD-10-CM | POA: Diagnosis not present

## 2020-09-26 DIAGNOSIS — J9601 Acute respiratory failure with hypoxia: Secondary | ICD-10-CM | POA: Diagnosis not present

## 2020-09-26 DIAGNOSIS — I10 Essential (primary) hypertension: Secondary | ICD-10-CM | POA: Diagnosis not present

## 2020-09-26 DIAGNOSIS — I5032 Chronic diastolic (congestive) heart failure: Secondary | ICD-10-CM | POA: Diagnosis not present

## 2020-09-26 LAB — GLUCOSE, CAPILLARY
Glucose-Capillary: 143 mg/dL — ABNORMAL HIGH (ref 70–99)
Glucose-Capillary: 162 mg/dL — ABNORMAL HIGH (ref 70–99)
Glucose-Capillary: 180 mg/dL — ABNORMAL HIGH (ref 70–99)
Glucose-Capillary: 223 mg/dL — ABNORMAL HIGH (ref 70–99)

## 2020-09-26 LAB — CBC
HCT: 41.2 % (ref 39.0–52.0)
Hemoglobin: 13.9 g/dL (ref 13.0–17.0)
MCH: 33.4 pg (ref 26.0–34.0)
MCHC: 33.7 g/dL (ref 30.0–36.0)
MCV: 99 fL (ref 80.0–100.0)
Platelets: 114 10*3/uL — ABNORMAL LOW (ref 150–400)
RBC: 4.16 MIL/uL — ABNORMAL LOW (ref 4.22–5.81)
RDW: 13.5 % (ref 11.5–15.5)
WBC: 5.2 10*3/uL (ref 4.0–10.5)
nRBC: 0 % (ref 0.0–0.2)

## 2020-09-26 LAB — FERRITIN: Ferritin: 318 ng/mL (ref 24–336)

## 2020-09-26 LAB — D-DIMER, QUANTITATIVE: D-Dimer, Quant: <0.27 ug/mL-FEU (ref 0.00–0.50)

## 2020-09-26 LAB — COMPREHENSIVE METABOLIC PANEL
ALT: 5 U/L (ref 0–44)
AST: 13 U/L — ABNORMAL LOW (ref 15–41)
Albumin: 2.8 g/dL — ABNORMAL LOW (ref 3.5–5.0)
Alkaline Phosphatase: 52 U/L (ref 38–126)
Anion gap: 7 (ref 5–15)
BUN: 18 mg/dL (ref 8–23)
CO2: 23 mmol/L (ref 22–32)
Calcium: 8.3 mg/dL — ABNORMAL LOW (ref 8.9–10.3)
Chloride: 107 mmol/L (ref 98–111)
Creatinine, Ser: 0.73 mg/dL (ref 0.61–1.24)
GFR, Estimated: >60 mL/min (ref >60)
Glucose, Bld: 173 mg/dL — ABNORMAL HIGH (ref 70–99)
Potassium: 4.3 mmol/L (ref 3.5–5.1)
Sodium: 137 mmol/L (ref 135–145)
Total Bilirubin: 0.7 mg/dL (ref 0.3–1.2)
Total Protein: 5.4 g/dL — ABNORMAL LOW (ref 6.5–8.1)

## 2020-09-26 LAB — PHOSPHORUS: Phosphorus: 3.1 mg/dL (ref 2.5–4.6)

## 2020-09-26 LAB — C-REACTIVE PROTEIN: CRP: 0.7 mg/dL (ref <1.0)

## 2020-09-26 LAB — MAGNESIUM: Magnesium: 2.4 mg/dL (ref 1.7–2.4)

## 2020-09-26 MED ORDER — BISACODYL 10 MG RE SUPP: 10.0000 mg | Freq: Every day | RECTAL | Status: DC

## 2020-09-26 MED ORDER — FLEET ENEMA 7-19 GM/118ML RE ENEM: 1.0000 | ENEMA | Freq: Every day | RECTAL | Status: DC | PRN

## 2020-09-26 MED ORDER — POLYETHYLENE GLYCOL 3350 17 G PO PACK
17.0000 g | PACK | Freq: Two times a day (BID) | ORAL | Status: DC
  Administered 2020-09-26 (×2): 17 g via ORAL
  Filled 2020-09-26 (×3): qty 1

## 2020-09-26 NOTE — Progress Notes (Signed)
PROGRESS NOTE                                                                                                                                                                                                             Patient Demographics:    Raymond White, is a 70 y.o. male, DOB - 1950/12/08, ZOX:[id number]  Outpatient Primary MD for the patient is Sandi Raveling   Admit date - 09/23/2020   LOS - 3  Chief Complaint  Patient presents with  . Fall       Brief Narrative: Patient is a 70 y.o. male with PMHx of chronic diastolic heart failure, chronic back pain, Parkinson's disease, DM-2, depression, HLD-who presented to the hospital with a 5-day history of weakness/cough/shortness of breath and a mechanical fall down 12 flights of stairs.  He was found to have acute hypoxic respiratory failure due to COVID-19 pneumonia-evaluated by neurosurgery for back pain-and subsequently admitted to the hospitalist service.  See below for further details.   COVID-19 vaccinated status: Vaccinated-J&J vaccine x1  Significant Events: 2/4>> Admit to [facility] for fall-hypoxia due to COVID-19 pneumonia  Significant studies: 1/10>> Echo: EF 55-60% 2/4>> chest x-ray: Bibasilar infiltrates 2/4>> CT chest: Peripheral predominant groundglass opacities-COVID-19 infection 2/4>> CT head: No acute intracranial injury 2/4>> CT C-spine: No cervical spine injury 2/4>> MRI C-spine: No obvious cord lesion/hematoma/epidural hematoma 2/4>> MRI T-spine: Degenerative changes-but no acute fracture/cord lesion/spinal canal compromise 2/4>> MRI LS spine: No acute changes-T12-L1 paracentral disc protrusion 2/5>>Chest x-ray: Bibasilar airspace disease-compatible with Covid 19  COVID-19 medications: Steroids: 2/4>> Remdesivir: 2/4>>  Antibiotics: None  Microbiology data: None  Procedures: None  Consults: Neurosurgery  DVT prophylaxis: heparin  injection 5,000 Units Start: 09/23/20 1600 Place TED hose Start: 09/23/20 1538    Subjective:   Appears very comfortable-on just 2 L of oxygen this morning.   Assessment  & Plan :   Acute Hypoxic Resp Failure due to Covid 19 Viral pneumonia: Hypoxia has improved-Down to 2 L of oxygen this morning-continue steroids and Remdesivir  Fever: afebrile O2 requirements:  SpO2: 96 % O2 Flow Rate (L/min): 4 L/min   COVID-19 Labs: Recent Labs    09/23/20 1639 09/24/20 0425 09/25/20 0303 09/26/20 0425  DDIMER 1.04* 0.52* 0.44 <0.27  FERRITIN 348* 337* 332 318  LDH 200*  --   --   --  CRP 2.6* 3.8* 1.9* 0.7       Component Value Date/Time   BNP 95.2 09/23/2020 1639    Recent Labs  Lab 09/23/20 1639  PROCALCITON <0.10    Lab Results  Component Value Date   SARSCOV2NAA POSITIVE (A) 09/23/2020   SARSCOV2NAA NEGATIVE 08/30/2020   SARSCOV2NAA NEGATIVE 08/26/2020   SARSCOV2NAA NEGATIVE 03/31/2020     Prone/Incentive Spirometry: encouraged  incentive spirometry use 3-4/hour.  Mechanical fall-acute on chronic back pain: Doubt syncope-probably has autonomic dysfunction in the setting of known Parkinson's disease.  Acute pain likely due to muscle contusion/bone contusion from falls.  Pain much better controlled the past few days-we will try to use oral narcotics first before giving patient IV narcotics.  On bowel regimen.  PT/OT on board.  Plans are for SNF on discharge.    Leukopenia/thrombocytopenia: Improving-due to COVID-19 infection.  Has mild thrombocytopenia at baseline.  Chronic diastolic heart failure: Compensated-no evidence of volume overload-continue Aldactone  CAD: No anginal symptoms-continue antiplatelet/Lipitor-Coreg on hold due to bradycardia.  Nocturnal sinus bradycardia: Asymptomatic-beta-blocker held for now-continue to monitor closely.  Recent TSH normal on 1/8  HTN: BP stable-continue Aldactone  HLD: Continue statin  DM-2 (A1c 5.5 on 2/4): CBG  stable-continue SSI  Recent Labs    09/25/20 1735 09/25/20 2053 09/26/20 0732  GLUCAP 132* 171* 143*    Parkinson's disease: Continue Sinemet  Anxiety/depression: Continue Cymbalta-somewhat anxious-due to acute pain-continue BuSpar  BPH: Continue Flomax  Obesity: Estimated body mass index is 30.31 kg/m as calculated from the following:   Height as of this encounter: 6\' 1"  (1.854 m).   Weight as of this encounter: 104.2 kg.     GI prophylaxis: PPI  ABG:    Component Value Date/Time   HCO3 20.6 08/26/2020 1900   TCO2 23 09/23/2020 0829   ACIDBASEDEF 1.0 08/26/2020 1900   O2SAT 99.0 08/26/2020 1900    Vent Settings: N/A    Condition - Stable  Family Communication  : Per patient-he has no family-he requested we call Cecille Po [phone]) his POA only if he is unable to communicate with [place].  Code Status :  Full Code  Diet :  Diet Order            Diet regular Room service appropriate? Yes; Fluid consistency: Thin  Diet effective now                  Disposition Plan  :   Status is: Inpatient  Remains inpatient appropriate because:Inpatient level of care appropriate due to severity of illness   Dispo: The patient is from: Home              Anticipated d/c is to: SNF              Anticipated d/c date is: > 1-2 days              Patient currently is not medically stable to d/c.   Difficult to place patient No  Barriers to discharge: Hypoxia requiring O2 supplementation/complete 5 days of IV Remdesivir  Antimicorbials  :    Anti-infectives (From admission, onward)   Start     Dose/Rate Route Frequency Ordered Stop   09/24/20 1000  remdesivir 100 mg in sodium chloride 0.9 % 100 mL IVPB       "Followed by" Linked Group Details   100 mg 200 mL/hr over 30 Minutes Intravenous Daily 09/23/20 1545 09/28/20 0959   09/23/20 1700  remdesivir 200 mg in sodium  chloride 0.9% 250 mL IVPB       "Followed by" Linked Group Details   200 mg 580 mL/hr over 30  Minutes Intravenous Once 09/23/20 1545 09/23/20 1825      Inpatient Medications  Scheduled Meds: . acetaminophen  1,000 mg Oral Q8H  . aspirin  81 mg Oral Daily  . benzonatate  200 mg Oral TID  . bisacodyl  10 mg Rectal Daily  . busPIRone  10 mg Oral TID  . carbidopa-levodopa  2.5 tablet Oral TID  . cholecalciferol  1,000 Units Oral Daily  . cholestyramine  4 g Oral TID  . clopidogrel  75 mg Oral Daily  . DULoxetine  30 mg Oral BID  . feeding supplement  237 mL Oral BID BM  . heparin  5,000 Units Subcutaneous Q8H  . insulin aspart  0-9 Units Subcutaneous TID WC  . mometasone-formoterol  2 puff Inhalation BID  . multivitamin with minerals  1 tablet Oral Daily  . pantoprazole  40 mg Oral Daily  . polyethylene glycol  17 g Oral BID  . predniSONE  50 mg Oral Daily  . rosuvastatin  40 mg Oral Daily  . senna  2 tablet Oral QHS  . spironolactone  25 mg Oral Daily  . tamsulosin  0.4 mg Oral QHS  . thiamine  100 mg Oral Daily  . vitamin B-12  1,000 mcg Oral Daily   Continuous Infusions: . remdesivir 100 mg in NS 100 mL 100 mg (09/26/20 1014)   PRN Meds:.albuterol, bisacodyl, diphenoxylate-atropine, methocarbamol, morphine injection, ondansetron **OR** ondansetron (ZOFRAN) IV, oxyCODONE, sodium phosphate   Time Spent in minutes  25     See all Orders from today for further details   Jeoffrey Massed M.D on 09/26/2020 at 10:27 AM  To page go to [email or web address] - use universal password  [facility] -  Office  [phone]    Objective:   Vitals:   09/25/20 0450 09/25/20 1344 09/25/20 2005 09/26/20 0420  BP: 113/73 116/79 101/65 120/69  Pulse: 94 66 61 60  Resp: 18 20 18 16   Temp: 98 F (36.7 C) 97.6 F (36.4 C) 98.1 F (36.7 C) 98.3 F (36.8 C)  TempSrc: Oral Oral Oral Oral  SpO2: 91% 90% 94% 96%  Weight:      Height:        Wt Readings from Last 3 Encounters:  09/23/20 104.2 kg  08/31/20 104.2 kg  06/21/20 104.3 kg     Intake/Output Summary (Last 24  hours) at 09/26/2020 1027 Last data filed at 09/26/2020 0900 Gross per 24 hour  Intake 990 ml  Output 650 ml  Net 340 ml     Physical Exam Gen Exam:Alert awake-not in any distress HEENT:atraumatic, normocephalic Chest: B/L clear to auscultation anteriorly CVS:S1S2 regular Abdomen:soft non tender, non distended Extremities:no edema Neurology: Non focal Skin: no rash   Data Review:    CBC Recent Labs  Lab 09/23/20 0818 09/23/20 0829 09/24/20 0746 09/26/20 0425  WBC 2.4*  --  1.5* 5.2  HGB 14.3 13.6 14.6 13.9  HCT 41.8 40.0 42.2 41.2  PLT 89*  --  95* 114*  MCV 98.6  --  98.6 99.0  MCH 33.7  --  34.1* 33.4  MCHC 34.2  --  34.6 33.7  RDW 14.0  --  13.8 13.5    Chemistries  Recent Labs  Lab 09/23/20 0818 09/23/20 0829 09/24/20 0425 09/25/20 0303 09/26/20 0425  NA 138 140 140 139 137  K 3.9 4.0  4.1 4.0 4.3  CL 106 105 110 107 107  CO2 22  --  19* 23 23  GLUCOSE 101* 96 146* 162* 173*  BUN 8 10 12 20 18   CREATININE 0.92 0.70 0.74 0.84 0.73  CALCIUM 8.5*  --  8.2* 8.2* 8.3*  MG  --   --  2.1 2.2 2.4  AST 21  --  19 15 13*  ALT 14  --  6 6 5   ALKPHOS 69  --  65 59 52  BILITOT 1.1  --  0.6 0.7 0.7   ------------------------------------------------------------------------------------------------------------------ No results for input(s): CHOL, HDL, LDLCALC, TRIG, CHOLHDL, LDLDIRECT in the last 72 hours.  Lab Results  Component Value Date   HGBA1C 5.5 09/23/2020   ------------------------------------------------------------------------------------------------------------------ No results for input(s): TSH, T4TOTAL, T3FREE, THYROIDAB in the last 72 hours.  Invalid input(s): FREET3 ------------------------------------------------------------------------------------------------------------------ Recent Labs    09/25/20 0303 09/26/20 0425  FERRITIN 332 318    Coagulation profile Recent Labs  Lab 09/23/20 0818  INR 1.0    Recent Labs     09/25/20 0303 09/26/20 0425  DDIMER 0.44 <0.27    Cardiac Enzymes No results for input(s): CKMB, TROPONINI, MYOGLOBIN in the last 168 hours.  Invalid input(s): CK ------------------------------------------------------------------------------------------------------------------    Component Value Date/Time   BNP 95.2 09/23/2020 1639    Micro Results Recent Results (from the past 240 hour(s))  SARS Coronavirus 2 by RT PCR (hospital order, performed in [facility] hospital lab) Nasopharyngeal Nasopharyngeal Swab     Status: Abnormal   Collection Time: 09/23/20  8:18 AM   Specimen: Nasopharyngeal Swab  Result Value Ref Range Status   SARS Coronavirus 2 POSITIVE (A) NEGATIVE Final    Comment: RESULT CALLED TO, READ BACK BY AND VERIFIED WITH: RN E.BANKS AT 1610 ON 09/23/2020 BY T.SAAD (NOTE) SARS-CoV-2 target nucleic acids are DETECTED  SARS-CoV-2 RNA is generally detectable in upper respiratory specimens  during the acute phase of infection.  Positive results are indicative  of the presence of the identified virus, but do not rule out bacterial infection or co-infection with other pathogens not detected by the test.  Clinical correlation with patient history and  other diagnostic information is necessary to determine patient infection status.  The expected result is negative.  Fact Sheet for Patients:   BoilerBrush.com.cy   Fact Sheet for Healthcare Providers:   https://pope.com/    This test is not yet approved or cleared by the [place] FDA and  has been authorized for detection and/or diagnosis of SARS-CoV-2 by FDA under an Emergency Use Authorization (EUA).  This EUA will remain in effect (meaning  this test can be used) for the duration of  the COVID-19 declaration under Section 564(b)(1) of the Act, 21 U.S.C. section 360-bbb-3(b)(1), unless the authorization is terminated or revoked sooner.  Performed at [facility], [address], [place], [place] [address]     Radiology Reports CT HEAD WO CONTRAST  Result Date: 09/23/2020 CLINICAL DATA:  Minor head trauma EXAM: CT HEAD WITHOUT CONTRAST CT CERVICAL SPINE WITHOUT CONTRAST TECHNIQUE: Multidetector CT imaging of the head and cervical spine was performed following the standard protocol without intravenous contrast. Multiplanar CT image reconstructions of the cervical spine were also generated. COMPARISON:  None. FINDINGS: CT HEAD FINDINGS Brain: No evidence of acute infarction, hemorrhage, hydrocephalus, extra-axial collection or mass lesion/mass effect. Generalized atrophy. Vascular: No hyperdense vessel or unexpected calcification. Skull: Normal. Negative for fracture or focal lesion. Sinuses/Orbits: No acute finding. CT CERVICAL  SPINE FINDINGS Alignment: Straightening of the cervical spine. Skull base and vertebrae: No acute fracture or focal bone lesion. Soft tissues and spinal canal: No prevertebral fluid or swelling. No visible canal hematoma. Disc levels: C4-5 and C5-6 solid arthrodesis. Disc degeneration is diffuse and advanced. 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 There is facet ankylosis at C2-3 on the left. Upper chest: No evidence of injury IMPRESSION: No evidence of acute intracranial or cervical spine injury. Electronically Signed   By: Marnee Spring M.D.   On: 09/23/2020 09:37   CT CERVICAL SPINE WO CONTRAST  Result Date: 09/23/2020 CLINICAL DATA:  Minor head trauma EXAM: CT HEAD WITHOUT CONTRAST CT CERVICAL SPINE WITHOUT CONTRAST TECHNIQUE: Multidetector CT imaging of the head and cervical spine was performed following the standard protocol without intravenous contrast. Multiplanar CT image reconstructions of the cervical spine were also generated. COMPARISON:  None. FINDINGS: CT HEAD FINDINGS Brain: No evidence of acute infarction, hemorrhage, hydrocephalus, extra-axial collection or mass lesion/mass effect. Generalized atrophy.  Vascular: No hyperdense vessel or unexpected calcification. Skull: Normal. Negative for fracture or focal lesion. Sinuses/Orbits: No acute finding. CT CERVICAL SPINE FINDINGS Alignment: Straightening of the cervical spine. Skull base and vertebrae: No acute fracture or focal bone lesion. Soft tissues and spinal canal: No prevertebral fluid or swelling. No visible canal hematoma. Disc levels: C4-5 and C5-6 solid arthrodesis. Disc degeneration is diffuse and advanced. 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 There is facet ankylosis at C2-3 on the left. Upper chest: No evidence of injury IMPRESSION: No evidence of acute intracranial or cervical spine injury. Electronically Signed   By: Marnee Spring M.D.   On: 09/23/2020 09:37   MR Cervical Spine Wo Contrast  Result Date: 09/23/2020 CLINICAL DATA:  Multiple trauma.  Fell downstairs. EXAM: MRI CERVICAL, THORACIC AND LUMBAR SPINE WITHOUT CONTRAST TECHNIQUE: Multiplanar and multiecho pulse sequences of the cervical spine, to include the craniocervical junction and cervicothoracic junction, and thoracic and lumbar spine, were obtained without intravenous contrast. COMPARISON:  CT scans, same date. FINDINGS: MRI CERVICAL SPINE FINDINGS Exam is quite limited due to patient motion. Alignment: Normal Vertebrae: No acute bony findings. No definite fracture or bone lesion. Cord: Grossly normal.  No obvious cord lesion or cord hematoma. Posterior Fossa, vertebral arteries, paraspinal tissues: No significant findings. Disc levels: Multilevel disc disease and facet disease but no significant canal compromise or canal hematoma. MRI THORACIC SPINE FINDINGS Alignment: Scoliosis but normal overall alignment in the sagittal plane. Vertebrae: No acute thoracic spine fractures are identified. The facets are normally aligned. No facet fractures. Cord: No obvious cord lesions or cord hematoma. No intraspinal compressive hematoma. Paraspinal and other soft tissues: No  significant findings. Disc levels: Multilevel disc disease and facet disease. Shallow multilevel disc protrusions and areas of osteophytic ridging but no significant canal compromise. MRI LUMBAR SPINE FINDINGS Segmentation: There are five lumbar type vertebral bodies. The last full intervertebral disc space is labeled L5-S1. Alignment: Degenerative lumbar spondylosis with multilevel degenerative listhesis. Vertebrae: No acute bony findings. No fracture or worrisome bone lesion. Conus medullaris and cauda equina: Conus extends to the L1. level. Conus and cauda equina appear normal. Paraspinal and other soft tissues: No significant paraspinal or retroperitoneal findings. Right renal cyst is noted. Disc levels: T12-L1: Focal right paracentral disc protrusion with mass effect on the thecal sac potentially involving the right L1 nerve root. No spinal stenosis. L1-2: No significant findings. L2-3: Wide decompressive laminectomy. Degenerative retrolisthesis of L2 compared to L3 but no significant spinal or lateral  recess stenosis. Mild foraminal narrowing bilaterally. L3-4: Wide decompressive laminectomy. No significant spinal or foraminal stenosis. L4-5: Wide decompressive laminectomy. Mild bilateral foraminal stenosis but no significant spinal stenosis. Mild right lateral recess stenosis. L5-S1: Mild bilateral foraminal stenosis but no spinal or lateral recess stenosis. Advanced facet disease. IMPRESSION: 1. Very limited MR examination of the cervical spine but no obvious cord lesion, cord hematoma or epidural hematoma. 2. Degenerative changes involving the thoracic spine but no acute fracture, cord lesion or spinal canal compromise. 3. Focal right paracentral disc protrusion at T12-L1 potentially involving the right L1 nerve root. 4. Advanced degenerative changes involving the lumbar spine but no significant canal compromise. 5. Postoperative changes involving the lumbar spine with wide decompressive laminectomies.  Electronically Signed   By: Rudie MeyerP.  Gallerani M.D.   On: 09/23/2020 13:21   MR THORACIC SPINE WO CONTRAST  Result Date: 09/23/2020 CLINICAL DATA:  Multiple trauma.  Fell downstairs. EXAM: MRI CERVICAL, THORACIC AND LUMBAR SPINE WITHOUT CONTRAST TECHNIQUE: Multiplanar and multiecho pulse sequences of the cervical spine, to include the craniocervical junction and cervicothoracic junction, and thoracic and lumbar spine, were obtained without intravenous contrast. COMPARISON:  CT scans, same date. FINDINGS: MRI CERVICAL SPINE FINDINGS Exam is quite limited due to patient motion. Alignment: Normal Vertebrae: No acute bony findings. No definite fracture or bone lesion. Cord: Grossly normal.  No obvious cord lesion or cord hematoma. Posterior Fossa, vertebral arteries, paraspinal tissues: No significant findings. Disc levels: Multilevel disc disease and facet disease but no significant canal compromise or canal hematoma. MRI THORACIC SPINE FINDINGS Alignment: Scoliosis but normal overall alignment in the sagittal plane. Vertebrae: No acute thoracic spine fractures are identified. The facets are normally aligned. No facet fractures. Cord: No obvious cord lesions or cord hematoma. No intraspinal compressive hematoma. Paraspinal and other soft tissues: No significant findings. Disc levels: Multilevel disc disease and facet disease. Shallow multilevel disc protrusions and areas of osteophytic ridging but no significant canal compromise. MRI LUMBAR SPINE FINDINGS Segmentation: There are five lumbar type vertebral bodies. The last full intervertebral disc space is labeled L5-S1. Alignment: Degenerative lumbar spondylosis with multilevel degenerative listhesis. Vertebrae: No acute bony findings. No fracture or worrisome bone lesion. Conus medullaris and cauda equina: Conus extends to the L1. level. Conus and cauda equina appear normal. Paraspinal and other soft tissues: No significant paraspinal or retroperitoneal findings. Right  renal cyst is noted. Disc levels: T12-L1: Focal right paracentral disc protrusion with mass effect on the thecal sac potentially involving the right L1 nerve root. No spinal stenosis. L1-2: No significant findings. L2-3: Wide decompressive laminectomy. Degenerative retrolisthesis of L2 compared to L3 but no significant spinal or lateral recess stenosis. Mild foraminal narrowing bilaterally. L3-4: Wide decompressive laminectomy. No significant spinal or foraminal stenosis. L4-5: Wide decompressive laminectomy. Mild bilateral foraminal stenosis but no significant spinal stenosis. Mild right lateral recess stenosis. L5-S1: Mild bilateral foraminal stenosis but no spinal or lateral recess stenosis. Advanced facet disease. IMPRESSION: 1. Very limited MR examination of the cervical spine but no obvious cord lesion, cord hematoma or epidural hematoma. 2. Degenerative changes involving the thoracic spine but no acute fracture, cord lesion or spinal canal compromise. 3. Focal right paracentral disc protrusion at T12-L1 potentially involving the right L1 nerve root. 4. Advanced degenerative changes involving the lumbar spine but no significant canal compromise. 5. Postoperative changes involving the lumbar spine with wide decompressive laminectomies. Electronically Signed   By: Rudie MeyerP.  Gallerani M.D.   On: 09/23/2020 13:21   MR THORACIC SPINE  WO CONTRAST  Result Date: 08/30/2020 CLINICAL DATA:  Recent fall. Fecal incontinence with lower extremity numbness. EXAM: MRI THORACIC SPINE WITHOUT CONTRAST TECHNIQUE: Multiplanar, multisequence MR imaging of the thoracic spine was performed. No intravenous contrast was administered. COMPARISON:  None. FINDINGS: Alignment:  Physiologic. Vertebrae: There is edema within the proximal left ribs and the left lamina and pars interarticularis of T8 and T9. Edema in the right laminae is present to a lesser extent. No acute fracture. Cord:  Normal signal and morphology. Paraspinal and other  soft tissues: Negative. Disc levels: There are small disc protrusions at all levels without spinal canal stenosis. There is posterior vertebral body fusion at T11-12. IMPRESSION: 1. Edema within the proximal left ribs and the left posterior elements of T8 and T9. This could indicate fracture or contusion. CT of the thoracic spine may be helpful further assessment. 2. Small disc protrusions at all levels without spinal canal stenosis. Electronically Signed   By: Deatra Robinson M.D.   On: 08/30/2020 22:50   MR LUMBAR SPINE WO CONTRAST  Result Date: 09/23/2020 CLINICAL DATA:  Multiple trauma.  Fell downstairs. EXAM: MRI CERVICAL, THORACIC AND LUMBAR SPINE WITHOUT CONTRAST TECHNIQUE: Multiplanar and multiecho pulse sequences of the cervical spine, to include the craniocervical junction and cervicothoracic junction, and thoracic and lumbar spine, were obtained without intravenous contrast. COMPARISON:  CT scans, same date. FINDINGS: MRI CERVICAL SPINE FINDINGS Exam is quite limited due to patient motion. Alignment: Normal Vertebrae: No acute bony findings. No definite fracture or bone lesion. Cord: Grossly normal.  No obvious cord lesion or cord hematoma. Posterior Fossa, vertebral arteries, paraspinal tissues: No significant findings. Disc levels: Multilevel disc disease and facet disease but no significant canal compromise or canal hematoma. MRI THORACIC SPINE FINDINGS Alignment: Scoliosis but normal overall alignment in the sagittal plane. Vertebrae: No acute thoracic spine fractures are identified. The facets are normally aligned. No facet fractures. Cord: No obvious cord lesions or cord hematoma. No intraspinal compressive hematoma. Paraspinal and other soft tissues: No significant findings. Disc levels: Multilevel disc disease and facet disease. Shallow multilevel disc protrusions and areas of osteophytic ridging but no significant canal compromise. MRI LUMBAR SPINE FINDINGS Segmentation: There are five lumbar  type vertebral bodies. The last full intervertebral disc space is labeled L5-S1. Alignment: Degenerative lumbar spondylosis with multilevel degenerative listhesis. Vertebrae: No acute bony findings. No fracture or worrisome bone lesion. Conus medullaris and cauda equina: Conus extends to the L1. level. Conus and cauda equina appear normal. Paraspinal and other soft tissues: No significant paraspinal or retroperitoneal findings. Right renal cyst is noted. Disc levels: T12-L1: Focal right paracentral disc protrusion with mass effect on the thecal sac potentially involving the right L1 nerve root. No spinal stenosis. L1-2: No significant findings. L2-3: Wide decompressive laminectomy. Degenerative retrolisthesis of L2 compared to L3 but no significant spinal or lateral recess stenosis. Mild foraminal narrowing bilaterally. L3-4: Wide decompressive laminectomy. No significant spinal or foraminal stenosis. L4-5: Wide decompressive laminectomy. Mild bilateral foraminal stenosis but no significant spinal stenosis. Mild right lateral recess stenosis. L5-S1: Mild bilateral foraminal stenosis but no spinal or lateral recess stenosis. Advanced facet disease. IMPRESSION: 1. Very limited MR examination of the cervical spine but no obvious cord lesion, cord hematoma or epidural hematoma. 2. Degenerative changes involving the thoracic spine but no acute fracture, cord lesion or spinal canal compromise. 3. Focal right paracentral disc protrusion at T12-L1 potentially involving the right L1 nerve root. 4. Advanced degenerative changes involving the lumbar spine but no  significant canal compromise. 5. Postoperative changes involving the lumbar spine with wide decompressive laminectomies. Electronically Signed   By: Rudie Meyer M.D.   On: 09/23/2020 13:21   DG Pelvis Portable  Result Date: 09/23/2020 CLINICAL DATA:  Fall, pelvic pain EXAM: PORTABLE PELVIS 1-2 VIEWS COMPARISON:  None. FINDINGS: There is no evidence of pelvic  fracture or diastasis. No pelvic bone lesions are seen. Degenerative changes are seen within the a lumbosacral junction, not well assessed on this exam. IMPRESSION: Negative. Electronically Signed   By: Helyn Numbers MD   On: 09/23/2020 08:31   CT CHEST ABDOMEN PELVIS W CONTRAST  Result Date: 09/23/2020 CLINICAL DATA:  Fall down steps EXAM: CT CHEST, ABDOMEN, AND PELVIS WITH CONTRAST TECHNIQUE: Multidetector CT imaging of the chest, abdomen and pelvis was performed following the standard protocol during bolus administration of intravenous contrast. CONTRAST:  OMNIPAQUE IOHEXOL 300 MG/ML  SOLN COMPARISON:  August 26, 2020 FINDINGS: CT CHEST FINDINGS Cardiovascular: No evidence of acute aortic injury with limited evaluation of the aortic root secondary to cardiac motion. LEFT-sided coronary artery atherosclerotic calcifications heart is mildly enlarged. No pericardial effusion. Scattered atherosclerotic calcifications throughout the aorta. Mediastinum/Nodes: Thyroid is unremarkable. No suspicious axillary or mediastinal lymph nodes. Lungs/Pleura: No pleural effusion or pneumothorax. Revisualization of elevation of the RIGHT hemidiaphragm with chronic calcific densities and scarring overlying the RIGHT hemidiaphragm. There are new peripheral predominant ground-glass opacities throughout bilateral lungs. Musculoskeletal: Remote RIGHT-sided rib fractures. There is fat stranding overlying the RIGHT lateral soft tissues, partially excluded from the field of view. No evidence of active contrast extravasation. Multilevel degenerative changes of the thoracic spine better assessed on contemporaneous dedicated CT. CT ABDOMEN PELVIS FINDINGS Hepatobiliary: Status post cholecystectomy. Unchanged central biliary prominence, likely due to post cholecystectomy state. No evidence of hepatic laceration. Pancreas: No peripancreatic fat stranding. Spleen: No splenic injury or perisplenic hematoma. Adrenals/Urinary Tract:  Adrenal glands are unremarkable. Kidneys enhance symmetrically. No evidence of renal laceration. RIGHT-sided renal cysts. Additional subcentimeter hypodense lesions are too small to accurately characterize. Bladder is decompressed with circumferentially thickened walls; this likely reflects a sequela of chronic outlet obstruction. Stomach/Bowel: Hiatal hernia with a LEFT-sided esophageal diverticulum, unchanged. No evidence of bowel obstruction. Scattered diverticulosis. Vascular/Lymphatic: Mild atherosclerotic calcifications of the aorta. No suspicious lymphadenopathy. 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 Other: Fat containing LEFT inguinal hernia.  No free fluid. Musculoskeletal: Multilevel degenerative changes of the lumbar spine better assessed on contemporaneous CT. IMPRESSION: 1. No evidence of solid organ or aortic injury. Fat stranding along the RIGHT lateral chest wall and flank likely reflects contusion/bruising. 2. Peripheral predominant ground-glass opacities likely reflect atypical infection such as COVID 19 infection. 3. Please see separately dictated report regarding thoracolumbar spine CT. Aortic Atherosclerosis (ICD10-I70.0). Electronically Signed   By: Meda Klinefelter MD   On: 09/23/2020 10:13   CT T-SPINE NO CHARGE  Result Date: 09/23/2020 CLINICAL DATA:  Larey Seat downstairs.  Back pain. EXAM: CT THORACIC AND LUMBAR SPINE WITHOUT CONTRAST TECHNIQUE: Multidetector CT imaging of the thoracic and lumbar spine was performed without contrast. Multiplanar CT image reconstructions were also generated. COMPARISON:  MRI thoracic spine 08/30/2020 and MRI lumbar spine 08/26/2020. FINDINGS: CT THORACIC SPINE FINDINGS Alignment: Stable right convex thoracic scoliosis. Normal overall alignment of the thoracic vertebral bodies in the sagittal plane. Vertebrae: No acute thoracic spine fracture is identified. Stable degenerative changes. The facets are normally aligned. Paraspinal and other soft  tissues: No significant paraspinal findings. Dependent bibasilar atelectasis is noted. No mediastinal  mass or hematoma. No paraspinal hematoma. The visualized posterior ribs are intact. Disc levels: No large disc protrusions, significant spinal or foraminal stenosis. No intraspinal hematoma. CT LUMBAR SPINE FINDINGS Segmentation: There are five lumbar type vertebral bodies. The last full intervertebral disc space is labeled L5-S1. Alignment: Normal overall alignment. 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 Vertebrae: No acute lumbar spine fracture. Postoperative changes with wide decompressive laminectomies. No worrisome bone lesions. Paraspinal and other soft tissues: No significant paraspinal or retroperitoneal findings. Moderate aortic calcifications but no aneurysm. A right renal cyst is noted. Disc levels: Decompressive laminectomies without significant canal stenosis. No hematoma or abnormal fluid collections. The upper sacrum is intact and the SI joints are intact. IMPRESSION: 1. No acute thoracic or lumbar spine fracture is identified. 2. Stable right convex thoracic scoliosis. 3. 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 4. Postoperative changes involving the lumbar spine with wide decompressive laminectomies. Aortic Atherosclerosis (ICD10-I70.0). Electronically Signed   By: Rudie Meyer M.D.   On: 09/23/2020 10:08   CT L-SPINE NO CHARGE  Result Date: 09/23/2020 CLINICAL DATA:  Larey Seat downstairs.  Back pain. EXAM: CT THORACIC AND LUMBAR SPINE WITHOUT CONTRAST TECHNIQUE: Multidetector CT imaging of the thoracic and lumbar spine was performed without contrast. Multiplanar CT image reconstructions were also generated. COMPARISON:  MRI thoracic spine 08/30/2020 and MRI lumbar spine 08/26/2020. FINDINGS: CT THORACIC SPINE FINDINGS Alignment: Stable right convex thoracic scoliosis. Normal overall alignment of the  thoracic vertebral bodies in the sagittal plane. Vertebrae: No acute thoracic spine fracture is identified. Stable degenerative changes. The facets are normally aligned. Paraspinal and other soft tissues: No significant paraspinal findings. Dependent bibasilar atelectasis is noted. No mediastinal mass or hematoma. No paraspinal hematoma. The visualized posterior ribs are intact. Disc levels: No large disc protrusions, significant spinal or foraminal stenosis. No intraspinal hematoma. CT LUMBAR SPINE FINDINGS Segmentation: There are five lumbar type vertebral bodies. The last full intervertebral disc space is labeled L5-S1. Alignment: Normal overall alignment. 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 Vertebrae: No acute lumbar spine fracture. Postoperative changes with wide decompressive laminectomies. No worrisome bone lesions. Paraspinal and other soft tissues: No significant paraspinal or retroperitoneal findings. Moderate aortic calcifications but no aneurysm. A right renal cyst is noted. Disc levels: Decompressive laminectomies without significant canal stenosis. No hematoma or abnormal fluid collections. The upper sacrum is intact and the SI joints are intact. IMPRESSION: 1. No acute thoracic or lumbar spine fracture is identified. 2. Stable right convex thoracic scoliosis. 3. 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 4. Postoperative changes involving the lumbar spine with wide decompressive laminectomies. Aortic Atherosclerosis (ICD10-I70.0). Electronically Signed   By: Rudie Meyer M.D.   On: 09/23/2020 10:08   DG Chest Port 1 View  Result Date: 09/23/2020 CLINICAL DATA:  Chest pain after fall. EXAM: PORTABLE CHEST 1 VIEW COMPARISON:  August 28, 2020. FINDINGS: The heart size and mediastinal contours are within normal limits. No pneumothorax is noted. Mild bibasilar subsegmental atelectasis or infiltrates are noted  with small right pleural effusion. The visualized skeletal structures are unremarkable. IMPRESSION: Mild bibasilar subsegmental atelectasis or infiltrates are noted with small right pleural effusion. Electronically Signed   By: Lupita Raider M.D.   On: 09/23/2020 08:41   DG Chest Port 1 View  Result Date: 08/28/2020 CLINICAL DATA:  Hypoxia EXAM: PORTABLE CHEST 1 VIEW COMPARISON:  August 26, 2020 FINDINGS: The cardiomediastinal  silhouette is stable. No pneumothorax. Right basilar atelectasis, unchanged. Elevation right hemidiaphragm identified. Mild opacity in the left base is new in the interval, favored to represent atelectasis. Air under the right hemidiaphragm is noted to be interposed colon seen on previous CT imaging. No other acute abnormalities. IMPRESSION: 1. New mild opacity in the left base, favored to represent atelectasis. 2. Stable right basilar atelectasis and elevation of the right hemidiaphragm. Electronically Signed   By: Gerome Sam III M.D   On: 08/28/2020 19:17   DG Chest Port 1V same Day  Result Date: 09/24/2020 CLINICAL DATA:  Shortness of breath.  COVID positive.  Fall at home. EXAM: PORTABLE CHEST 1 VIEW COMPARISON:  One-view chest x-ray 09/23/2020 FINDINGS: Heart is enlarged. Bibasilar airspace opacities are again seen, slightly increased bilaterally. Bilateral effusions are present. IMPRESSION: 1. Slight increase in bibasilar airspace opacities compatible COVID 19 infection. 2. Stable bilateral pleural effusions. Electronically Signed   By: Marin Roberts M.D.   On: 09/24/2020 11:36   ECHOCARDIOGRAM COMPLETE  Result Date: 08/29/2020    ECHOCARDIOGRAM REPORT   Patient Name:   Raymond White Date of Exam: 08/29/2020 Medical Rec #:  [id number]         Height:       73.0 in Accession #:    [id number]        Weight:       227.7 lb Date of Birth:  July 05, 1951         BSA:          2.274 m Patient Age:    69 years          BP:           102/59 mmHg Patient Gender: M                  HR:           64 bpm. Exam Location:  Inpatient Procedure: 2D Echo Indications:    dyspnea.  History:        Patient has prior history of Echocardiogram examinations, most                 recent 08/29/2020. CHF, Parkinson's; Risk Factors:Hypertension.  Sonographer:    Delcie Roch Referring Phys: 1 DAYNA N DUNN  Sonographer Comments: Suboptimal subcostal window. IMPRESSIONS  1. Left ventricular ejection fraction, by estimation, is 55 to 60%. The left ventricle has normal function. The left ventricle has no regional wall motion abnormalities. Left ventricular diastolic parameters are consistent with Grade I diastolic dysfunction (impaired relaxation).  2. Right ventricular systolic function is normal. The right ventricular size is normal. There is normal pulmonary artery systolic pressure.  3. The mitral valve is normal in structure. Trivial mitral valve regurgitation. No evidence of mitral stenosis.  4. The aortic valve is normal in structure. Aortic valve regurgitation is mild. No aortic stenosis is present.  5. Aortic dilatation noted. There is mild dilatation of the ascending aorta, measuring 39 mm. There is mild dilatation at the level of the sinuses of Valsalva, measuring 38 mm.  6. The inferior vena cava is normal in size with greater than 50% respiratory variability, suggesting right atrial pressure of 3 mmHg. FINDINGS  Left Ventricle: Left ventricular ejection fraction, by estimation, is 55 to 60%. The left ventricle has normal function. The left ventricle has no regional wall motion abnormalities. The left ventricular internal cavity size was normal in size. There is  no left ventricular hypertrophy. Left ventricular diastolic  parameters are consistent with Grade I diastolic dysfunction (impaired relaxation). Normal left ventricular filling pressure. Right Ventricle: The right ventricular size is normal. No increase in right ventricular wall thickness. Right ventricular systolic function is  normal. There is normal pulmonary artery systolic pressure. The tricuspid regurgitant velocity is 2.74 m/s, and  with an assumed right atrial pressure of 3 mmHg, the estimated right ventricular systolic pressure is 33.0 mmHg. Left Atrium: Left atrial size was normal in size. Right Atrium: Right atrial size was normal in size. Pericardium: There is no evidence of pericardial effusion. Mitral Valve: The mitral valve is normal in structure. Mild mitral annular calcification. Trivial mitral valve regurgitation. No evidence of mitral valve stenosis. Tricuspid Valve: The tricuspid valve is normal in structure. Tricuspid valve regurgitation is trivial. No evidence of tricuspid stenosis. Aortic Valve: The aortic valve is normal in structure. Aortic valve regurgitation is mild. No aortic stenosis is present. Pulmonic Valve: The pulmonic valve was normal in structure. Pulmonic valve regurgitation is trivial. No evidence of pulmonic stenosis. Aorta: Aortic dilatation noted. There is mild dilatation of the ascending aorta, measuring 39 mm. There is mild dilatation at the level of the sinuses of Valsalva, measuring 38 mm. Venous: The inferior vena cava is normal in size with greater than 50% respiratory variability, suggesting right atrial pressure of 3 mmHg. IAS/Shunts: No atrial level shunt detected by color flow Doppler.  LEFT VENTRICLE PLAX 2D LVIDd:         5.20 cm  Diastology LVIDs:         3.50 cm  LV e' medial:    7.07 cm/s LV PW:         1.00 cm  LV E/e' medial:  7.8 LV IVS:        1.10 cm  LV e' lateral:   10.20 cm/s LVOT diam:     2.20 cm  LV E/e' lateral: 5.4 LV SV:         65 LV SV Index:   29 LVOT Area:     3.80 cm  RIGHT VENTRICLE RV S prime:     12.20 cm/s TAPSE (M-mode): 1.9 cm LEFT ATRIUM             Index LA diam:        4.40 cm 1.94 cm/m LA Vol (A2C):   67.0 ml 29.47 ml/m LA Vol (A4C):   73.2 ml 32.20 ml/m LA Biplane Vol: 74.8 ml 32.90 ml/m  AORTIC VALVE LVOT Vmax:   78.30 cm/s LVOT Vmean:  52.300 cm/s  LVOT VTI:    0.171 m  AORTA Ao Root diam: 3.80 cm Ao Asc diam:  3.90 cm MITRAL VALVE               TRICUSPID VALVE MV Area (PHT): 4.21 cm    TR Peak grad:   30.0 mmHg MV Decel Time: 180 msec    TR Vmax:        274.00 cm/s MV E velocity: 54.80 cm/s MV A velocity: 66.40 cm/s  SHUNTS MV E/A ratio:  0.83        Systemic VTI:  0.17 m                            Systemic Diam: 2.20 cm Armanda Magic MD Electronically signed by Armanda Magic MD Signature Date/Time: 08/29/2020/1:08:28 PM    Final

## 2020-09-26 NOTE — TOC Progression Note (Signed)
Transition of Care [facility]) - Progression Note    Patient Details  Name: Raymond White MRN: [id number] Date of Birth: [birth year]  [facility] CM/SW Contact  Veronia Laprise, LCSWA Phone Number: 09/26/2020, 2:45 PM  Clinical Narrative:   CSW spoke with Mr. Kintz over the phone in his room today to inform him that [facility] is not accepting COVID patients at this time and that there is a bed available for him at [facility] if the MD feels that he is medically ready for discharge tomorrow. Mr. Sanguinetti reported understanding and the CSW informed him that he will be transported by the non-emergency ambulance at time of discharge and Mr. Mears is in agreement with bed placement plan.    Expected Discharge Plan: Skilled Nursing Facility Barriers to Discharge: Continued Medical Work up  Expected Discharge Plan and Services Expected Discharge Plan: Skilled Nursing Facility       Living arrangements for the past 2 months: Single Family Home                                       Social Determinants of Health (SDOH) Interventions    Readmission Risk Interventions No flowsheet data found.

## 2020-09-26 NOTE — Progress Notes (Signed)
Occupational Therapy Evaluation Patient Details Name: Raymond White MRN: [id number] DOB: [birth year] Today's Date: 09/26/2020    History of Present Illness 70 y.o. male seen in the emergency room today with complaints of a fall down the stairs.  Patient thinks he tripped when his foot got stuck.  Patient is unsure if he lost consciousness. +back pain, numbness/weakness bil LEs. Neurosurgery consult CT and MRI of brain and spine: no acute fractures, +ankylosis, no hematoma,  PMH-LBP with sciatica, CHF, DDD, depression, TIA, HTN, Parkinson's, CVA, urinary incontinence   Clinical Impression   PTA pt lives alone and completes ADL and mobility at modified independent level. Pt states he had a caregiver who assisted, however he no longer has assistance and tasks have been more difficult PTA. Able to ambulate on RA @ 30 ft with SpO2 dropping to @ 86 without SOB @ minguard level using RW. Requires mod A with ADL tasks due to generalized weakness and back pain. At rest, SpO2 95 on RA. Recommend rehab at SNF to facilitate safe DC home. Will follow acutely.     Follow Up Recommendations  SNF    Equipment Recommendations  3 in 1 bedside commode    Recommendations for Other Services       Precautions / Restrictions Precautions Precautions: Fall      Mobility Bed Mobility Overal bed mobility: Modified Independent                  Transfers Overall transfer level: Needs assistance Equipment used: Rolling walker (2 wheeled) Transfers: Sit to/from Stand Sit to Stand: Min guard              Balance Overall balance assessment: History of Falls (reports foot catches the tread on the steps multiple times per year (last fell 3 months ago))                                         ADL either performed or assessed with clinical judgement   ADL Overall ADL's : Needs assistance/impaired     Grooming: Set up;Sitting   Upper Body Bathing: Minimal  assistance;Sitting   Lower Body Bathing: Minimal assistance;Sit to/from stand   Upper Body Dressing : Supervision/safety;Set up;Sitting   Lower Body Dressing: Moderate assistance;Sit to/from stand   Toilet Transfer: Min guard;Ambulation   Toileting- Clothing Manipulation and Hygiene: Supervision/safety;Sit to/from stand       Functional mobility during ADLs: Hydrographic surveyor     Praxis      Pertinent Vitals/Pain Pain Assessment: Faces Faces Pain Scale: Hurts little more Pain Location: back Pain Descriptors / Indicators: Constant;Cramping;Grimacing;Guarding;Sharp Pain Intervention(s): Limited activity within patient's tolerance     Hand Dominance Right   Extremity/Trunk Assessment Upper Extremity Assessment Upper Extremity Assessment: Generalized weakness   Lower Extremity Assessment Lower Extremity Assessment: Defer to PT evaluation   Cervical / Trunk Assessment Cervical / Trunk Assessment:  (hx of back surgeries (5); back pain from fall)   Communication Communication Communication: No difficulties   Cognition Arousal/Alertness: Awake/alert Behavior During Therapy: WFL for tasks assessed/performed Overall Cognitive Status: Within Functional Limits for tasks assessed                                 General  Comments: most likely close to baseline level of functioning   General Comments       Exercises     Shoulder Instructions      Home Living Family/patient expects to be discharged to:: Skilled nursing facility                                        Prior Functioning/Environment Level of Independence: Independent with assistive device(s)  Gait / Transfers Assistance Needed: Pt reports he uses a RW or a SPC     Comments: uses rollator in the community; caregiver drives/does errands; caregiver does majority of cooking; helps with cleaning; mostly uses papaer plates; caregiver  does medication management; pt does his own pharmacy        OT Problem List: Decreased strength;Decreased range of motion;Decreased activity tolerance;Impaired balance (sitting and/or standing);Decreased safety awareness;Decreased knowledge of use of DME or AE;Cardiopulmonary status limiting activity;Obesity;Pain      OT Treatment/Interventions: Self-care/ADL training;Therapeutic exercise;Energy conservation;DME and/or AE instruction;Therapeutic activities;Patient/family education;Balance training    OT Goals(Current goals can be found in the care plan section) Acute Rehab OT Goals Patient Stated Goal: to get stronger OT Goal Formulation: With patient Time For Goal Achievement: 10/10/20 Potential to Achieve Goals: Good  OT Frequency: Min 2X/week   Barriers to D/C:            Co-evaluation              AM-PAC OT "6 Clicks" Daily Activity     Outcome Measure Help from another person eating meals?: None Help from another person taking care of personal grooming?: A Little Help from another person toileting, which includes using toliet, bedpan, or urinal?: A Little Help from another person bathing (including washing, rinsing, drying)?: A Little Help from another person to put on and taking off regular upper body clothing?: A Little Help from another person to put on and taking off regular lower body clothing?: A Lot 6 Click Score: 18   End of Session Equipment Utilized During Treatment: Gait belt;Rolling walker;Oxygen Nurse Communication: Mobility status  Activity Tolerance: Patient tolerated treatment well Patient left: in chair;with call bell/phone within reach  OT Visit Diagnosis: Unsteadiness on feet (R26.81);Other abnormalities of gait and mobility (R26.89);Muscle weakness (generalized) (M62.81);Pain Pain - part of body:  (back)                Time: 1346-1420 OT Time Calculation (min): 34 min Charges:  OT General Charges $OT Visit: 1 Visit OT Evaluation $OT Eval  Moderate Complexity: 1 Mod OT Treatments $Self Care/Home Management : 8-22 mins  Luisa Dago, OT/L   Acute OT Clinical Specialist Acute Rehabilitation Services Pager [phone] Office [phone]   [facility] 09/26/2020, 3:22 PM

## 2020-09-27 DIAGNOSIS — J9601 Acute respiratory failure with hypoxia: Secondary | ICD-10-CM | POA: Diagnosis not present

## 2020-09-27 DIAGNOSIS — W19XXXA Unspecified fall, initial encounter: Secondary | ICD-10-CM | POA: Diagnosis not present

## 2020-09-27 DIAGNOSIS — I10 Essential (primary) hypertension: Secondary | ICD-10-CM | POA: Diagnosis not present

## 2020-09-27 DIAGNOSIS — I5032 Chronic diastolic (congestive) heart failure: Secondary | ICD-10-CM | POA: Diagnosis not present

## 2020-09-27 LAB — COMPREHENSIVE METABOLIC PANEL
ALT: 23 U/L (ref 0–44)
AST: 55 U/L — ABNORMAL HIGH (ref 15–41)
Albumin: 2.9 g/dL — ABNORMAL LOW (ref 3.5–5.0)
Alkaline Phosphatase: 44 U/L (ref 38–126)
Anion gap: 9 (ref 5–15)
BUN: 17 mg/dL (ref 8–23)
CO2: 20 mmol/L — ABNORMAL LOW (ref 22–32)
Calcium: 8.2 mg/dL — ABNORMAL LOW (ref 8.9–10.3)
Chloride: 108 mmol/L (ref 98–111)
Creatinine, Ser: 0.66 mg/dL (ref 0.61–1.24)
GFR, Estimated: >60 mL/min (ref >60)
Glucose, Bld: 142 mg/dL — ABNORMAL HIGH (ref 70–99)
Potassium: 4.7 mmol/L (ref 3.5–5.1)
Sodium: 137 mmol/L (ref 135–145)
Total Bilirubin: 1.1 mg/dL (ref 0.3–1.2)
Total Protein: 5.2 g/dL — ABNORMAL LOW (ref 6.5–8.1)

## 2020-09-27 LAB — CBC
HCT: 42.2 % (ref 39.0–52.0)
Hemoglobin: 13.6 g/dL (ref 13.0–17.0)
MCH: 33.3 pg (ref 26.0–34.0)
MCHC: 32.2 g/dL (ref 30.0–36.0)
MCV: 103.2 fL — ABNORMAL HIGH (ref 80.0–100.0)
Platelets: 125 10*3/uL — ABNORMAL LOW (ref 150–400)
RBC: 4.09 MIL/uL — ABNORMAL LOW (ref 4.22–5.81)
RDW: 13.5 % (ref 11.5–15.5)
WBC: 5.6 10*3/uL (ref 4.0–10.5)
nRBC: 0 % (ref 0.0–0.2)

## 2020-09-27 LAB — PHOSPHORUS: Phosphorus: 3.2 mg/dL (ref 2.5–4.6)

## 2020-09-27 LAB — FERRITIN: Ferritin: 389 ng/mL — ABNORMAL HIGH (ref 24–336)

## 2020-09-27 LAB — D-DIMER, QUANTITATIVE: D-Dimer, Quant: 0.32 ug/mL-FEU (ref 0.00–0.50)

## 2020-09-27 LAB — C-REACTIVE PROTEIN: CRP: 0.5 mg/dL (ref <1.0)

## 2020-09-27 LAB — GLUCOSE, CAPILLARY
Glucose-Capillary: 121 mg/dL — ABNORMAL HIGH (ref 70–99)
Glucose-Capillary: 137 mg/dL — ABNORMAL HIGH (ref 70–99)

## 2020-09-27 LAB — MAGNESIUM: Magnesium: 2.3 mg/dL (ref 1.7–2.4)

## 2020-09-27 MED ORDER — MORPHINE SULFATE (PF) 2 MG/ML IV SOLN
1.0000 mg | INTRAVENOUS | Status: DC | PRN
Start: 2020-09-27 — End: 2020-09-27

## 2020-09-27 MED ORDER — PREDNISONE 10 MG PO TABS: ORAL_TABLET | ORAL | 0 refills | Status: DC

## 2020-09-27 MED ORDER — ACETAMINOPHEN 500 MG PO TABS: 1000.0000 mg | ORAL_TABLET | Freq: Three times a day (TID) | ORAL | 0 refills | Status: AC

## 2020-09-27 MED ORDER — OXYCODONE HCL 5 MG PO TABS
5.0000 mg | ORAL_TABLET | Freq: Four times a day (QID) | ORAL | 0 refills | Status: DC | PRN
Start: 2020-09-27 — End: 2021-01-02

## 2020-09-27 MED ORDER — SPIRONOLACTONE 25 MG PO TABS: 25.0000 mg | ORAL_TABLET | Freq: Every day | ORAL | Status: DC

## 2020-09-27 NOTE — TOC Transition Note (Signed)
Transition of Care [facility]) - CM/SW Discharge Note   Patient Details  Name: SKYLAN LARA MRN: [id number] Date of Birth: 21-Jan-1951  Transition of Care [facility]) CM/SW Contact:  Merita Hawks, LCSWA Phone Number: 09/27/2020, 9:57 AM   Clinical Narrative:    Patient will DC to: [place] Anticipated DC date: 09/27/20 Family notified: No family available to notify Transport by: Sharin Mons   Per MD patient ready for DC to [place]. RN to call report prior to discharge. RN, patient, patient's family, and facility notified of DC. Discharge Summary and FL2 sent to facility. DC packet on chart. Ambulance transport requested for patient.   CSW will sign off for now as social work intervention is no longer needed. Please consult [place] again if new needs arise.       Final next level of care: Skilled Nursing Facility Barriers to Discharge: Barriers Resolved   Patient Goals and CMS Choice Patient states their goals for this hospitalization and ongoing recovery are:: to go to SNF CMS Medicare.gov Compare Post Acute Care list provided to:: Patient Choice offered to / list presented to : NA  Discharge Placement   Existing PASRR number confirmed : 09/27/20          Patient chooses bed at: [facility] Patient to be transferred to facility by: [facility] Name of family member notified: N/A no family    Discharge Plan and Services                                     Social Determinants of Health (SDOH) Interventions     Readmission Risk Interventions No flowsheet data found.

## 2020-09-27 NOTE — Discharge Summary (Signed)
PATIENT DETAILS Name: Raymond White Age: 70 y.o. Sex: male Date of Birth: [birth year] MRN: [id number]. Admitting Physician: Gertha Calkin, MD WUJ:[id number], Gerrit Friends, PA-C  Admit Date: 09/23/2020 Discharge date: 09/27/2020  Recommendations for Outpatient Follow-up:  1. Follow up with PCP in 1-2 weeks 2. Please obtain CMP/CBC in one week 3. Repeat Chest Xray in 4-6 week  Admitted From:  Home  Disposition: SNF   Home Health: No  Equipment/Devices: None  Discharge Condition: Stable  CODE STATUS: FULL CODE  Diet recommendation:  Diet Order            Diet - low sodium heart healthy           Diet regular Room service appropriate? Yes; Fluid consistency: Thin  Diet effective now                Brief Narrative: Patient is a 70 y.o. male with PMHx of chronic diastolic heart failure, chronic back pain, Parkinson's disease, DM-2, depression, HLD-who presented to the hospital with a 5-day history of weakness/cough/shortness of breath and a mechanical fall down 12 flights of stairs.  He was found to have acute hypoxic respiratory failure due to COVID-19 pneumonia-evaluated by neurosurgery for back pain-and subsequently admitted to the hospitalist service.  See below for further details.   COVID-19 vaccinated status: Vaccinated-J&J vaccine x1  Significant Events: 2/4>> Admit to [facility] 2 for fall-hypoxia due to COVID-19 pneumonia  Significant studies: 1/10>> Echo: EF 55-60% 2/4>> chest x-ray: Bibasilar infiltrates 2/4>> CT chest: Peripheral predominant groundglass opacities-COVID-19 infection 2/4>> CT head: No acute intracranial injury 2/4>> CT C-spine: No cervical spine injury 2/4>> MRI C-spine: No obvious cord lesion/hematoma/epidural hematoma 2/4>> MRI T-spine: Degenerative changes-but no acute fracture/cord lesion/spinal canal compromise 2/4>> MRI LS spine: No acute changes-T12-L1 paracentral disc protrusion 2/5>>Chest x-ray: Bibasilar airspace disease-compatible  with Covid 19  COVID-19 medications: Steroids: 2/4>> Remdesivir: 2/4>>  Antibiotics: None  Microbiology data: None  Procedures: None  Consults: Neurosurgery   Brief Hospital Course: Acute Hypoxic Resp Failure due to Covid 19 Viral pneumonia:  Much improved-treated with steroid/Remdesivir-titrated on room air today.  He feels significantly better as well-much more comfortable.  We will has completed a course of Remdesivir-we will be on tapering steroids for a few more days.  Please repeat two-view chest x-ray in 4 to 6 weeks.    COVID-19 Labs:  Recent Labs    09/25/20 0303 09/26/20 0425 09/27/20 0228  DDIMER 0.44 <0.27 0.32  FERRITIN 332 318 389*  CRP 1.9* 0.7 0.5    Lab Results  Component Value Date   SARSCOV2NAA POSITIVE (A) 09/23/2020   SARSCOV2NAA NEGATIVE 08/30/2020   SARSCOV2NAA NEGATIVE 08/26/2020   SARSCOV2NAA NEGATIVE 03/31/2020     Mechanical fall-acute on chronic back pain: Doubt syncope-probably has autonomic dysfunction in the setting of known Parkinson's disease.  Acute pain likely due to muscle contusion/bone contusion from falls.  Pain much better controlled the past few days-IV narcotics has been minimized-pain controlled with mostly oral medications.  On bowel regimen.  Being discharged to SNF-continue to titrate down narcotics over the next few days/weeks.  Leukopenia/thrombocytopenia: Improving-due to COVID-19 infection.  Has mild thrombocytopenia at baseline.  Chronic diastolic heart failure: Compensated-no evidence of volume overload-continue Aldactone/Lasix.  Follow volume status closely.  CAD: No anginal symptoms-continue antiplatelet/Lipitor-Coreg on hold due to bradycardia.  Nocturnal sinus bradycardia: Asymptomatic-beta-blocker held for now-continue to monitor closely.  Recent TSH normal on 1/8  HTN: BP stable-continue Aldactone  HLD: Continue statin  DM-2 (A1c 5.5  on 2/4):  Continue to monitor closely-suspect diet  controlled.  Parkinson's disease: Continue Sinemet  Anxiety/depression: Continue Cymbalta-and BuSpar  BPH: Continue Flomax  Obesity: Estimated body mass index is 30.31 kg/m as calculated from the following:   Height as of this encounter: 6\' 1"  (1.854 m).   Weight as of this encounter: 104.2 kg.    Discharge Diagnoses:  Principal Problem:   Fall at home, initial encounter Active Problems:   Chronic heart failure with preserved ejection fraction (HCC)   Hypertension   Respiratory failure with hypoxia (HCC)   Acute respiratory failure with hypoxemia [facility])   Discharge Instructions:    Person Under Monitoring Name: Raymond White  Location: [address] [place] [address]   Infection Prevention Recommendations for Individuals Confirmed to have, or Being Evaluated for, 2019 Novel Coronavirus (COVID-19) Infection Who Receive Care at Home  Individuals who are confirmed to have, or are being evaluated for, COVID-19 should follow the prevention steps below until a healthcare provider or local or state health department says they can return to normal activities.  Stay home except to get medical care You should restrict activities outside your home, except for getting medical care. Do not go to work, school, or public areas, and do not use public transportation or taxis.  Call ahead before visiting your doctor Before your medical appointment, call the healthcare provider and tell them that you have, or are being evaluated for, COVID-19 infection. This will help the healthcare provider's office take steps to keep other people from getting infected. Ask your healthcare provider to call the local or state health department.  Monitor your symptoms Seek prompt medical attention if your illness is worsening (e.g., difficulty breathing). Before going to your medical appointment, call the healthcare provider and tell them that you have, or are being evaluated  for, COVID-19 infection. Ask your healthcare provider to call the local or state health department.  Wear a facemask You should wear a facemask that covers your nose and mouth when you are in the same room with other people and when you visit a healthcare provider. People who live with or visit you should also wear a facemask while they are in the same room with you.  Separate yourself from other people in your home As much as possible, you should stay in a different room from other people in your home. Also, you should use a separate bathroom, if available.  Avoid sharing household items You should not share dishes, drinking glasses, cups, eating utensils, towels, bedding, or other items with other people in your home. After using these items, you should wash them thoroughly with soap and water.  Cover your coughs and sneezes Cover your mouth and nose with a tissue when you cough or sneeze, or you can cough or sneeze into your sleeve. Throw used tissues in a lined trash can, and immediately wash your hands with soap and water for at least 20 seconds or use an alcohol-based hand rub.  Wash your [employer] your hands often and thoroughly with soap and water for at least 20 seconds. You can use an alcohol-based hand sanitizer if soap and water are not available and if your hands are not visibly dirty. Avoid touching your eyes, nose, and mouth with unwashed hands.   Prevention Steps for Caregivers and Household Members of Individuals Confirmed to have, or Being Evaluated for, COVID-19 Infection Being Cared for in the Home  If you live with, or provide care  at home for, a person confirmed to have, or being evaluated for, COVID-19 infection please follow these guidelines to prevent infection:  Follow healthcare provider's instructions Make sure that you understand and can help the patient follow any healthcare provider instructions for all care.  Provide for the patient's basic  needs You should help the patient with basic needs in the home and provide support for getting groceries, prescriptions, and other personal needs.  Monitor the patient's symptoms If they are getting sicker, call his or her medical provider and tell them that the patient has, or is being evaluated for, COVID-19 infection. This will help the healthcare provider's office take steps to keep other people from getting infected. Ask the healthcare provider to call the local or state health department.  Limit the number of people who have contact with the patient  If possible, have only one caregiver for the patient.  Other household members should stay in another home or place of residence. If this is not possible, they should stay  in another room, or be separated from the patient as much as possible. Use a separate bathroom, if available.  Restrict visitors who do not have an essential need to be in the home.  Keep older adults, very young children, and other sick people away from the patient Keep older adults, very young children, and those who have compromised immune systems or chronic health conditions away from the patient. This includes people with chronic heart, lung, or kidney conditions, diabetes, and cancer.  Ensure good ventilation Make sure that shared spaces in the home have good air flow, such as from an air conditioner or an opened window, weather permitting.  Wash your hands often  Wash your hands often and thoroughly with soap and water for at least 20 seconds. You can use an alcohol based hand sanitizer if soap and water are not available and if your hands are not visibly dirty.  Avoid touching your eyes, nose, and mouth with unwashed hands.  Use disposable paper towels to dry your hands. If not available, use dedicated cloth towels and replace them when they become wet.  Wear a facemask and gloves  Wear a disposable facemask at all times in the room and gloves when  you touch or have contact with the patient's blood, body fluids, and/or secretions or excretions, such as sweat, saliva, sputum, nasal mucus, vomit, urine, or feces.  Ensure the mask fits over your nose and mouth tightly, and do not touch it during use.  Throw out disposable facemasks and gloves after using them. Do not reuse.  Wash your hands immediately after removing your facemask and gloves.  If your personal clothing becomes contaminated, carefully remove clothing and launder. Wash your hands after handling contaminated clothing.  Place all used disposable facemasks, gloves, and other waste in a lined container before disposing them with other household waste.  Remove gloves and wash your hands immediately after handling these items.  Do not share dishes, glasses, or other household items with the patient  Avoid sharing household items. You should not share dishes, drinking glasses, cups, eating utensils, towels, bedding, or other items with a patient who is confirmed to have, or being evaluated for, COVID-19 infection.  After the person uses these items, you should wash them thoroughly with soap and water.  Wash laundry thoroughly  Immediately remove and wash clothes or bedding that have blood, body fluids, and/or secretions or excretions, such as sweat, saliva, sputum, nasal mucus, vomit, urine, or  feces, on them.  Wear gloves when handling laundry from the patient.  Read and follow directions on labels of laundry or clothing items and detergent. In general, wash and dry with the warmest temperatures recommended on the label.  Clean all areas the individual has used often  Clean all touchable surfaces, such as counters, tabletops, doorknobs, bathroom fixtures, toilets, phones, keyboards, tablets, and bedside tables, every day. Also, clean any surfaces that may have blood, body fluids, and/or secretions or excretions on them.  Wear gloves when cleaning surfaces the patient has  come in contact with.  Use a diluted bleach solution (e.g., dilute bleach with 1 part bleach and 10 parts water) or a household disinfectant with a label that says EPA-registered for coronaviruses. To make a bleach solution at home, add 1 tablespoon of bleach to 1 quart (4 cups) of water. For a larger supply, add  cup of bleach to 1 gallon (16 cups) of water.  Read labels of cleaning products and follow recommendations provided on product labels. Labels contain instructions for safe and effective use of the cleaning product including precautions you should take when applying the product, such as wearing gloves or eye protection and making sure you have good ventilation during use of the product.  Remove gloves and wash hands immediately after cleaning.  Monitor yourself for signs and symptoms of illness Caregivers and household members are considered close contacts, should monitor their health, and will be asked to limit movement outside of the home to the extent possible. Follow the monitoring steps for close contacts listed on the symptom monitoring form.   ? If you have additional questions, contact your local health department or call the epidemiologist on call at [phone] (available 24/7). ? This guidance is subject to change. For the most up-to-date guidance from CDC, please refer to their website: [employer]Met[employer].hu    Activity:  As tolerated with Full fall precautions use walker/cane & assistance as needed Discharge Instructions    Call MD for:  difficulty breathing, headache or visual disturbances   Complete by: As directed    Diet - low sodium heart healthy   Complete by: As directed    Discharge instructions   Complete by: As directed    1.)  21 days of isolation from day after first positive Covid test  2.)  If you develop worsening shortness of breath please seek immediate medical attention   Follow with Primary  MD  Judeth Horn, PA-C in 1-2 weeks  Please get a complete blood count and chemistry panel checked by your Primary MD at your next visit, and again as instructed by your Primary MD.  Get Medicines reviewed and adjusted: Please take all your medications with you for your next visit with your Primary MD  Laboratory/radiological data: Please request your Primary MD to go over all hospital tests and procedure/radiological results at the follow up, please ask your Primary MD to get all Hospital records sent to his/her office.  In some cases, they will be blood work, cultures and biopsy results pending at the time of your discharge. Please request that your primary care M.D. follows up on these results.  Also Note the following: If you experience worsening of your admission symptoms, develop shortness of breath, life threatening emergency, suicidal or homicidal thoughts you must seek medical attention immediately by calling 911 or calling your MD immediately  if symptoms less severe.  You must read complete instructions/literature along with all the possible adverse reactions/side effects for  all the Medicines you take and that have been prescribed to you. Take any new Medicines after you have completely understood and accpet all the possible adverse reactions/side effects.   Do not drive when taking Pain medications or sleeping medications (Benzodaizepines)  Do not take more than prescribed Pain, Sleep and Anxiety Medications. It is not advisable to combine anxiety,sleep and pain medications without talking with your primary care practitioner  Special Instructions: If you have smoked or chewed Tobacco  in the last 2 yrs please stop smoking, stop any regular Alcohol  and or any Recreational drug use.  Wear Seat belts while driving.  Please note: You were cared for by a hospitalist during your hospital stay. Once you are discharged, your primary care physician will handle any further medical  issues. Please note that NO REFILLS for any discharge medications will be authorized once you are discharged, as it is imperative that you return to your primary care physician (or establish a relationship with a primary care physician if you do not have one) for your post hospital discharge needs so that they can reassess your need for medications and monitor your lab values.   Discharge wound care:   Complete by: As directed    Wet-to-dry dressing changes daily   Increase activity slowly   Complete by: As directed      Allergies as of 09/27/2020      Reactions   Fish-derived Products Anaphylaxis   Zoloft [sertraline Hcl] Rash   Sulfamethizole Rash      Medication List    STOP taking these medications   carvedilol 6.25 MG tablet Commonly known as: COREG   cholestyramine 4 g packet Commonly known as: QUESTRAN   docusate sodium 100 MG capsule Commonly known as: COLACE     TAKE these medications   acetaminophen 500 MG tablet Commonly known as: TYLENOL Take 2 tablets (1,000 mg total) by mouth every 8 (eight) hours for 3 days. What changed:   medication strength  how much to take  when to take this  reasons to take this  additional instructions   albuterol 108 (90 Base) MCG/ACT inhaler Commonly known as: VENTOLIN HFA Inhale 2 puffs into the lungs every 6 (six) hours as needed for wheezing or shortness of breath. Wait 1 minute in between each puff   aspirin 81 MG chewable tablet Chew 81 mg by mouth daily.   bisacodyl 5 MG EC tablet Generic drug: bisacodyl Take 5 mg by mouth daily as needed for moderate constipation.   busPIRone 10 MG tablet Commonly known as: BUSPAR Take 1 tablet (10 mg total) by mouth 3 (three) times daily.   carbidopa-levodopa 25-100 MG tablet Commonly known as: SINEMET IR Take 2.5 tablets by mouth 3 (three) times daily. Take at 0900, 1300 and 1700   cholecalciferol 25 MCG (1000 UNIT) tablet Commonly known as: VITAMIN D3 Take 1,000 Units by  mouth daily.   clopidogrel 75 MG tablet Commonly known as: PLAVIX Take 75 mg by mouth daily.   diphenoxylate-atropine 2.5-0.025 MG tablet Commonly known as: LOMOTIL Take 1 tablet by mouth 3 (three) times daily as needed for diarrhea or loose stools.   DULoxetine 30 MG capsule Commonly known as: CYMBALTA Take 30 mg by mouth 2 (two) times daily.   EPINEPHrine 0.3 mg/0.3 mL Soaj injection Commonly known as: EPI-PEN Inject 0.3 mg into the muscle daily as needed for anaphylaxis.   famotidine 20 MG tablet Commonly known as: PEPCID Take 1 tablet (20 mg total) by  mouth in the morning and at bedtime.   Fluticasone-Salmeterol 250-50 MCG/DOSE Aepb Commonly known as: ADVAIR Inhale 1 puff into the lungs 2 (two) times daily.   furosemide 80 MG tablet Commonly known as: LASIX Take 80 mg by mouth See admin instructions. Take one tablet (80 mg) by mouth daily, may take an extra 1/2 tablet (40 mg) at attain dry weight of 125-127 lbs What changed: Another medication with the same name was removed. Continue taking this medication, and follow the directions you see here.   Magnesium Oxide 420 MG Tabs Take 1 tablet by mouth in the morning and at bedtime.   MENTHOL-METHYL SALICYLATE EX Apply 1 application topically 4 (four) times daily as needed (pain). 10-15% Topical Cream   omeprazole 20 MG capsule Commonly known as: PRILOSEC Take 20 mg by mouth daily as needed (heartburn/reflux).   oxyCODONE 5 MG immediate release tablet Commonly known as: Oxy IR/ROXICODONE Take 1 tablet (5 mg total) by mouth every 6 (six) hours as needed for moderate pain.   potassium chloride SA 20 MEQ tablet Commonly known as: KLOR-CON Take 20 mEq by mouth 2 (two) times daily. With food   predniSONE 10 MG tablet Commonly known as: DELTASONE Take 40 mg daily for 1 day, 30 mg daily for 1 day, 20 mg daily for 1 days,10 mg daily for 1 day, then stop   rosuvastatin 40 MG tablet Commonly known as: CRESTOR Take 40 mg  by mouth daily.   sildenafil 50 MG tablet Commonly known as: VIAGRA Take 50 mg by mouth daily as needed for erectile dysfunction. Take 1 hour prior to sexual activity*DO NOT EXCEED 1 DOSE PER 24 HOUR PERIOD*   spironolactone 25 MG tablet Commonly known as: ALDACTONE Take 1 tablet (25 mg total) by mouth daily.   tamsulosin 0.4 MG Caps capsule Commonly known as: FLOMAX Take 0.4 mg by mouth at bedtime. Hold Dose For Systolic Pressure<100   thiamine 100 MG tablet Commonly known as: Vitamin B-1 Take 1 tablet (100 mg total) by mouth daily.   tiZANidine 4 MG tablet Commonly known as: ZANAFLEX Take 4 mg by mouth 2 (two) times daily as needed for muscle spasms. Avoid Taking if Excessive Drowsiness or Difficulty Breathing; Caution Drowsiness-Avoid Driving, Operating Machinery, Climbing   vitamin B-12 500 MCG tablet Commonly known as: CYANOCOBALAMIN Take 1,000 mcg by mouth daily.            Discharge Care Instructions  (From admission, onward)         Start     Ordered   09/27/20 0000  Discharge wound care:       Comments: Wet-to-dry dressing changes daily   09/27/20 0936          Contact information for after-discharge care    Destination    [facility] .   Service: Skilled Nursing Contact information: [address] [place] [address] [phone]                 Allergies  Allergen Reactions  . Fish-Derived Products Anaphylaxis  . Zoloft [Sertraline Hcl] Rash  . Sulfamethizole Rash      Other Procedures/Studies: CT HEAD WO CONTRAST  Result Date: 09/23/2020 CLINICAL DATA:  Minor head trauma EXAM: CT HEAD WITHOUT CONTRAST CT CERVICAL SPINE WITHOUT CONTRAST TECHNIQUE: Multidetector CT imaging of the head and cervical spine was performed following the standard protocol without intravenous contrast. Multiplanar CT image reconstructions of the cervical spine were also generated. COMPARISON:  None. FINDINGS:  CT HEAD FINDINGS  Brain: No evidence of acute infarction, hemorrhage, hydrocephalus, extra-axial collection or mass lesion/mass effect. Generalized atrophy. Vascular: No hyperdense vessel or unexpected calcification. Skull: Normal. Negative for fracture or focal lesion. Sinuses/Orbits: No acute finding. CT CERVICAL SPINE FINDINGS Alignment: Straightening of the cervical spine. Skull base and vertebrae: No acute fracture or focal bone lesion. Soft tissues and spinal canal: No prevertebral fluid or swelling. No visible canal hematoma. Disc levels: C4-5 and C5-6 solid arthrodesis. Disc degeneration is diffuse and advanced. 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 There is facet ankylosis at C2-3 on the left. Upper chest: No evidence of injury IMPRESSION: No evidence of acute intracranial or cervical spine injury. Electronically Signed   By: Marnee Spring M.D.   On: 09/23/2020 09:37   CT CERVICAL SPINE WO CONTRAST  Result Date: 09/23/2020 CLINICAL DATA:  Minor head trauma EXAM: CT HEAD WITHOUT CONTRAST CT CERVICAL SPINE WITHOUT CONTRAST TECHNIQUE: Multidetector CT imaging of the head and cervical spine was performed following the standard protocol without intravenous contrast. Multiplanar CT image reconstructions of the cervical spine were also generated. COMPARISON:  None. FINDINGS: CT HEAD FINDINGS Brain: No evidence of acute infarction, hemorrhage, hydrocephalus, extra-axial collection or mass lesion/mass effect. Generalized atrophy. Vascular: No hyperdense vessel or unexpected calcification. Skull: Normal. Negative for fracture or focal lesion. Sinuses/Orbits: No acute finding. CT CERVICAL SPINE FINDINGS Alignment: Straightening of the cervical spine. Skull base and vertebrae: No acute fracture or focal bone lesion. Soft tissues and spinal canal: No prevertebral fluid or swelling. No visible canal hematoma. Disc levels: C4-5 and C5-6 solid arthrodesis. Disc degeneration is diffuse and advanced. Advanced facet  osteoarthritis at multiple levels. There is facet ankylosis at C2-3 on the left. Upper chest: No evidence of injury IMPRESSION: No evidence of acute intracranial or cervical spine injury. Electronically Signed   By: Marnee Spring M.D.   On: 09/23/2020 09:37   MR Cervical Spine Wo Contrast  Result Date: 09/23/2020 CLINICAL DATA:  Multiple trauma.  Fell downstairs. EXAM: MRI CERVICAL, THORACIC AND LUMBAR SPINE WITHOUT CONTRAST TECHNIQUE: Multiplanar and multiecho pulse sequences of the cervical spine, to include the craniocervical junction and cervicothoracic junction, and thoracic and lumbar spine, were obtained without intravenous contrast. COMPARISON:  CT scans, same date. FINDINGS: MRI CERVICAL SPINE FINDINGS Exam is quite limited due to patient motion. Alignment: Normal Vertebrae: No acute bony findings. No definite fracture or bone lesion. Cord: Grossly normal.  No obvious cord lesion or cord hematoma. Posterior Fossa, vertebral arteries, paraspinal tissues: No significant findings. Disc levels: Multilevel disc disease and facet disease but no significant canal compromise or canal hematoma. MRI THORACIC SPINE FINDINGS Alignment: Scoliosis but normal overall alignment in the sagittal plane. Vertebrae: No acute thoracic spine fractures are identified. The facets are normally aligned. No facet fractures. Cord: No obvious cord lesions or cord hematoma. No intraspinal compressive hematoma. Paraspinal and other soft tissues: No significant findings. Disc levels: Multilevel disc disease and facet disease. Shallow multilevel disc protrusions and areas of osteophytic ridging but no significant canal compromise. MRI LUMBAR SPINE FINDINGS Segmentation: There are five lumbar type vertebral bodies. The last full intervertebral disc space is labeled L5-S1. Alignment: Degenerative lumbar spondylosis with multilevel degenerative listhesis. Vertebrae: No acute bony findings. No fracture or worrisome bone lesion. Conus  medullaris and cauda equina: Conus extends to the L1. level. Conus and cauda equina appear normal. Paraspinal and other soft tissues: No significant paraspinal or retroperitoneal findings. Right renal cyst is noted. Disc levels: T12-L1: Focal right  paracentral disc protrusion with mass effect on the thecal sac potentially involving the right L1 nerve root. No spinal stenosis. L1-2: No significant findings. L2-3: Wide decompressive laminectomy. Degenerative retrolisthesis of L2 compared to L3 but no significant spinal or lateral recess stenosis. Mild foraminal narrowing bilaterally. L3-4: Wide decompressive laminectomy. No significant spinal or foraminal stenosis. L4-5: Wide decompressive laminectomy. Mild bilateral foraminal stenosis but no significant spinal stenosis. Mild right lateral recess stenosis. L5-S1: Mild bilateral foraminal stenosis but no spinal or lateral recess stenosis. Advanced facet disease. IMPRESSION: 1. Very limited MR examination of the cervical spine but no obvious cord lesion, cord hematoma or epidural hematoma. 2. Degenerative changes involving the thoracic spine but no acute fracture, cord lesion or spinal canal compromise. 3. Focal right paracentral disc protrusion at T12-L1 potentially involving the right L1 nerve root. 4. Advanced degenerative changes involving the lumbar spine but no significant canal compromise. 5. Postoperative changes involving the lumbar spine with wide decompressive laminectomies. Electronically Signed   By: Rudie Meyer M.D.   On: 09/23/2020 13:21   MR THORACIC SPINE WO CONTRAST  Result Date: 09/23/2020 CLINICAL DATA:  Multiple trauma.  Fell downstairs. EXAM: MRI CERVICAL, THORACIC AND LUMBAR SPINE WITHOUT CONTRAST TECHNIQUE: Multiplanar and multiecho pulse sequences of the cervical spine, to include the craniocervical junction and cervicothoracic junction, and thoracic and lumbar spine, were obtained without intravenous contrast. COMPARISON:  CT scans, same  date. FINDINGS: MRI CERVICAL SPINE FINDINGS Exam is quite limited due to patient motion. Alignment: Normal Vertebrae: No acute bony findings. No definite fracture or bone lesion. Cord: Grossly normal.  No obvious cord lesion or cord hematoma. Posterior Fossa, vertebral arteries, paraspinal tissues: No significant findings. Disc levels: Multilevel disc disease and facet disease but no significant canal compromise or canal hematoma. MRI THORACIC SPINE FINDINGS Alignment: Scoliosis but normal overall alignment in the sagittal plane. Vertebrae: No acute thoracic spine fractures are identified. The facets are normally aligned. No facet fractures. Cord: No obvious cord lesions or cord hematoma. No intraspinal compressive hematoma. Paraspinal and other soft tissues: No significant findings. Disc levels: Multilevel disc disease and facet disease. Shallow multilevel disc protrusions and areas of osteophytic ridging but no significant canal compromise. MRI LUMBAR SPINE FINDINGS Segmentation: There are five lumbar type vertebral bodies. The last full intervertebral disc space is labeled L5-S1. Alignment: Degenerative lumbar spondylosis with multilevel degenerative listhesis. Vertebrae: No acute bony findings. No fracture or worrisome bone lesion. Conus medullaris and cauda equina: Conus extends to the L1. level. Conus and cauda equina appear normal. Paraspinal and other soft tissues: No significant paraspinal or retroperitoneal findings. Right renal cyst is noted. Disc levels: T12-L1: Focal right paracentral disc protrusion with mass effect on the thecal sac potentially involving the right L1 nerve root. No spinal stenosis. L1-2: No significant findings. L2-3: Wide decompressive laminectomy. Degenerative retrolisthesis of L2 compared to L3 but no significant spinal or lateral recess stenosis. Mild foraminal narrowing bilaterally. L3-4: Wide decompressive laminectomy. No significant spinal or foraminal stenosis. L4-5: Wide  decompressive laminectomy. Mild bilateral foraminal stenosis but no significant spinal stenosis. Mild right lateral recess stenosis. L5-S1: Mild bilateral foraminal stenosis but no spinal or lateral recess stenosis. Advanced facet disease. IMPRESSION: 1. Very limited MR examination of the cervical spine but no obvious cord lesion, cord hematoma or epidural hematoma. 2. Degenerative changes involving the thoracic spine but no acute fracture, cord lesion or spinal canal compromise. 3. Focal right paracentral disc protrusion at T12-L1 potentially involving the right L1 nerve root. 4. Advanced  degenerative changes involving the lumbar spine but no significant canal compromise. 5. Postoperative changes involving the lumbar spine with wide decompressive laminectomies. Electronically Signed   By: Rudie Meyer M.D.   On: 09/23/2020 13:21   MR THORACIC SPINE WO CONTRAST  Result Date: 08/30/2020 CLINICAL DATA:  Recent fall. Fecal incontinence with lower extremity numbness. EXAM: MRI THORACIC SPINE WITHOUT CONTRAST TECHNIQUE: Multiplanar, multisequence MR imaging of the thoracic spine was performed. No intravenous contrast was administered. COMPARISON:  None. FINDINGS: Alignment:  Physiologic. Vertebrae: There is edema within the proximal left ribs and the left lamina and pars interarticularis of T8 and T9. Edema in the right laminae is present to a lesser extent. No acute fracture. Cord:  Normal signal and morphology. Paraspinal and other soft tissues: Negative. Disc levels: There are small disc protrusions at all levels without spinal canal stenosis. There is posterior vertebral body fusion at T11-12. IMPRESSION: 1. Edema within the proximal left ribs and the left posterior elements of T8 and T9. This could indicate fracture or contusion. CT of the thoracic spine may be helpful further assessment. 2. Small disc protrusions at all levels without spinal canal stenosis. Electronically Signed   By: Deatra Robinson M.D.   On:  08/30/2020 22:50   MR LUMBAR SPINE WO CONTRAST  Result Date: 09/23/2020 CLINICAL DATA:  Multiple trauma.  Fell downstairs. EXAM: MRI CERVICAL, THORACIC AND LUMBAR SPINE WITHOUT CONTRAST TECHNIQUE: Multiplanar and multiecho pulse sequences of the cervical spine, to include the craniocervical junction and cervicothoracic junction, and thoracic and lumbar spine, were obtained without intravenous contrast. COMPARISON:  CT scans, same date. FINDINGS: MRI CERVICAL SPINE FINDINGS Exam is quite limited due to patient motion. Alignment: Normal Vertebrae: No acute bony findings. No definite fracture or bone lesion. Cord: Grossly normal.  No obvious cord lesion or cord hematoma. Posterior Fossa, vertebral arteries, paraspinal tissues: No significant findings. Disc levels: Multilevel disc disease and facet disease but no significant canal compromise or canal hematoma. MRI THORACIC SPINE FINDINGS Alignment: Scoliosis but normal overall alignment in the sagittal plane. Vertebrae: No acute thoracic spine fractures are identified. The facets are normally aligned. No facet fractures. Cord: No obvious cord lesions or cord hematoma. No intraspinal compressive hematoma. Paraspinal and other soft tissues: No significant findings. Disc levels: Multilevel disc disease and facet disease. Shallow multilevel disc protrusions and areas of osteophytic ridging but no significant canal compromise. MRI LUMBAR SPINE FINDINGS Segmentation: There are five lumbar type vertebral bodies. The last full intervertebral disc space is labeled L5-S1. Alignment: Degenerative lumbar spondylosis with multilevel degenerative listhesis. Vertebrae: No acute bony findings. No fracture or worrisome bone lesion. Conus medullaris and cauda equina: Conus extends to the L1. level. Conus and cauda equina appear normal. Paraspinal and other soft tissues: No significant paraspinal or retroperitoneal findings. Right renal cyst is noted. Disc levels: T12-L1: Focal right  paracentral disc protrusion with mass effect on the thecal sac potentially involving the right L1 nerve root. No spinal stenosis. L1-2: No significant findings. L2-3: Wide decompressive laminectomy. Degenerative retrolisthesis of L2 compared to L3 but no significant spinal or lateral recess stenosis. Mild foraminal narrowing bilaterally. L3-4: Wide decompressive laminectomy. No significant spinal or foraminal stenosis. L4-5: Wide decompressive laminectomy. Mild bilateral foraminal stenosis but no significant spinal stenosis. Mild right lateral recess stenosis. L5-S1: Mild bilateral foraminal stenosis but no spinal or lateral recess stenosis. Advanced facet disease. IMPRESSION: 1. Very limited MR examination of the cervical spine but no obvious cord lesion, cord hematoma or epidural hematoma. 2.  Degenerative changes involving the thoracic spine but no acute fracture, cord lesion or spinal canal compromise. 3. Focal right paracentral disc protrusion at T12-L1 potentially involving the right L1 nerve root. 4. Advanced degenerative changes involving the lumbar spine but no significant canal compromise. 5. Postoperative changes involving the lumbar spine with wide decompressive laminectomies. Electronically Signed   By: Rudie Meyer M.D.   On: 09/23/2020 13:21   DG Pelvis Portable  Result Date: 09/23/2020 CLINICAL DATA:  Fall, pelvic pain EXAM: PORTABLE PELVIS 1-2 VIEWS COMPARISON:  None. FINDINGS: There is no evidence of pelvic fracture or diastasis. No pelvic bone lesions are seen. Degenerative changes are seen within the a lumbosacral junction, not well assessed on this exam. IMPRESSION: Negative. Electronically Signed   By: Helyn Numbers MD   On: 09/23/2020 08:31   CT CHEST ABDOMEN PELVIS W CONTRAST  Result Date: 09/23/2020 CLINICAL DATA:  Fall down steps EXAM: CT CHEST, ABDOMEN, AND PELVIS WITH CONTRAST TECHNIQUE: Multidetector CT imaging of the chest, abdomen and pelvis was performed following the standard  protocol during bolus administration of intravenous contrast. CONTRAST:  OMNIPAQUE IOHEXOL 300 MG/ML  SOLN COMPARISON:  August 26, 2020 FINDINGS: CT CHEST FINDINGS Cardiovascular: No evidence of acute aortic injury with limited evaluation of the aortic root secondary to cardiac motion. LEFT-sided coronary artery atherosclerotic calcifications heart is mildly enlarged. No pericardial effusion. Scattered atherosclerotic calcifications throughout the aorta. Mediastinum/Nodes: Thyroid is unremarkable. No suspicious axillary or mediastinal lymph nodes. Lungs/Pleura: No pleural effusion or pneumothorax. Revisualization of elevation of the RIGHT hemidiaphragm with chronic calcific densities and scarring overlying the RIGHT hemidiaphragm. There are new peripheral predominant ground-glass opacities throughout bilateral lungs. Musculoskeletal: Remote RIGHT-sided rib fractures. There is fat stranding overlying the RIGHT lateral soft tissues, partially excluded from the field of view. No evidence of active contrast extravasation. Multilevel degenerative changes of the thoracic spine better assessed on contemporaneous dedicated CT. CT ABDOMEN PELVIS FINDINGS Hepatobiliary: Status post cholecystectomy. Unchanged central biliary prominence, likely due to post cholecystectomy state. No evidence of hepatic laceration. Pancreas: No peripancreatic fat stranding. Spleen: No splenic injury or perisplenic hematoma. Adrenals/Urinary Tract: Adrenal glands are unremarkable. Kidneys enhance symmetrically. No evidence of renal laceration. RIGHT-sided renal cysts. Additional subcentimeter hypodense lesions are too small to accurately characterize. Bladder is decompressed with circumferentially thickened walls; this likely reflects a sequela of chronic outlet obstruction. Stomach/Bowel: Hiatal hernia with a LEFT-sided esophageal diverticulum, unchanged. No evidence of bowel obstruction. Scattered diverticulosis. Vascular/Lymphatic: Mild  atherosclerotic calcifications of the aorta. No suspicious lymphadenopathy. 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 Other: Fat containing LEFT inguinal hernia.  No free fluid. Musculoskeletal: Multilevel degenerative changes of the lumbar spine better assessed on contemporaneous CT. IMPRESSION: 1. No evidence of solid organ or aortic injury. Fat stranding along the RIGHT lateral chest wall and flank likely reflects contusion/bruising. 2. Peripheral predominant ground-glass opacities likely reflect atypical infection such as COVID 19 infection. 3. Please see separately dictated report regarding thoracolumbar spine CT. Aortic Atherosclerosis (ICD10-I70.0). Electronically Signed   By: Meda Klinefelter MD   On: 09/23/2020 10:13   CT T-SPINE NO CHARGE  Result Date: 09/23/2020 CLINICAL DATA:  Larey Seat downstairs.  Back pain. EXAM: CT THORACIC AND LUMBAR SPINE WITHOUT CONTRAST TECHNIQUE: Multidetector CT imaging of the thoracic and lumbar spine was performed without contrast. Multiplanar CT image reconstructions were also generated. COMPARISON:  MRI thoracic spine 08/30/2020 and MRI lumbar spine 08/26/2020. FINDINGS: CT THORACIC SPINE FINDINGS Alignment: Stable right convex thoracic scoliosis. Normal overall alignment of  the thoracic vertebral bodies in the sagittal plane. Vertebrae: No acute thoracic spine fracture is identified. Stable degenerative changes. The facets are normally aligned. Paraspinal and other soft tissues: No significant paraspinal findings. Dependent bibasilar atelectasis is noted. No mediastinal mass or hematoma. No paraspinal hematoma. The visualized posterior ribs are intact. Disc levels: No large disc protrusions, significant spinal or foraminal stenosis. No intraspinal hematoma. CT LUMBAR SPINE FINDINGS Segmentation: There are five lumbar type vertebral bodies. The last full intervertebral disc space is labeled L5-S1. Alignment: Normal overall alignment. Severe degenerative lumbar  spondylosis with multilevel disc disease and facet disease. Vertebrae: No acute lumbar spine fracture. Postoperative changes with wide decompressive laminectomies. No worrisome bone lesions. Paraspinal and other soft tissues: No significant paraspinal or retroperitoneal findings. Moderate aortic calcifications but no aneurysm. A right renal cyst is noted. Disc levels: Decompressive laminectomies without significant canal stenosis. No hematoma or abnormal fluid collections. The upper sacrum is intact and the SI joints are intact. IMPRESSION: 1. No acute thoracic or lumbar spine fracture is identified. 2. Stable right convex thoracic scoliosis. 3. 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 4. Postoperative changes involving the lumbar spine with wide decompressive laminectomies. Aortic Atherosclerosis (ICD10-I70.0). Electronically Signed   By: Rudie MeyerP.  Gallerani M.D.   On: 09/23/2020 10:08   CT L-SPINE NO CHARGE  Result Date: 09/23/2020 CLINICAL DATA:  Larey SeatFell downstairs.  Back pain. EXAM: CT THORACIC AND LUMBAR SPINE WITHOUT CONTRAST TECHNIQUE: Multidetector CT imaging of the thoracic and lumbar spine was performed without contrast. Multiplanar CT image reconstructions were also generated. COMPARISON:  MRI thoracic spine 08/30/2020 and MRI lumbar spine 08/26/2020. FINDINGS: CT THORACIC SPINE FINDINGS Alignment: Stable right convex thoracic scoliosis. Normal overall alignment of the thoracic vertebral bodies in the sagittal plane. Vertebrae: No acute thoracic spine fracture is identified. Stable degenerative changes. The facets are normally aligned. Paraspinal and other soft tissues: No significant paraspinal findings. Dependent bibasilar atelectasis is noted. No mediastinal mass or hematoma. No paraspinal hematoma. The visualized posterior ribs are intact. Disc levels: No large disc protrusions, significant spinal or foraminal stenosis. No intraspinal hematoma. CT LUMBAR SPINE  FINDINGS Segmentation: There are five lumbar type vertebral bodies. The last full intervertebral disc space is labeled L5-S1. Alignment: Normal overall alignment. 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 Vertebrae: No acute lumbar spine fracture. Postoperative changes with wide decompressive laminectomies. No worrisome bone lesions. Paraspinal and other soft tissues: No significant paraspinal or retroperitoneal findings. Moderate aortic calcifications but no aneurysm. A right renal cyst is noted. Disc levels: Decompressive laminectomies without significant canal stenosis. No hematoma or abnormal fluid collections. The upper sacrum is intact and the SI joints are intact. IMPRESSION: 1. No acute thoracic or lumbar spine fracture is identified. 2. Stable right convex thoracic scoliosis. 3. 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 4. Postoperative changes involving the lumbar spine with wide decompressive laminectomies. Aortic Atherosclerosis (ICD10-I70.0). Electronically Signed   By: Rudie MeyerP.  Gallerani M.D.   On: 09/23/2020 10:08   DG Chest Port 1 View  Result Date: 09/23/2020 CLINICAL DATA:  Chest pain after fall. EXAM: PORTABLE CHEST 1 VIEW COMPARISON:  August 28, 2020. FINDINGS: The heart size and mediastinal contours are within normal limits. No pneumothorax is noted. Mild bibasilar subsegmental atelectasis or infiltrates are noted with small right pleural effusion. The visualized skeletal structures are unremarkable. IMPRESSION: Mild bibasilar subsegmental atelectasis or infiltrates are noted with small right pleural effusion. Electronically Signed  By: Lupita Raider M.D.   On: 09/23/2020 08:41   DG Chest Port 1 View  Result Date: 08/28/2020 CLINICAL DATA:  Hypoxia EXAM: PORTABLE CHEST 1 VIEW COMPARISON:  August 26, 2020 FINDINGS: The cardiomediastinal silhouette is stable. No pneumothorax. Right basilar atelectasis,  unchanged. Elevation right hemidiaphragm identified. Mild opacity in the left base is new in the interval, favored to represent atelectasis. Air under the right hemidiaphragm is noted to be interposed colon seen on previous CT imaging. No other acute abnormalities. IMPRESSION: 1. New mild opacity in the left base, favored to represent atelectasis. 2. Stable right basilar atelectasis and elevation of the right hemidiaphragm. Electronically Signed   By: Gerome Sam III M.D   On: 08/28/2020 19:17   DG Chest Port 1V same Day  Result Date: 09/24/2020 CLINICAL DATA:  Shortness of breath.  COVID positive.  Fall at home. EXAM: PORTABLE CHEST 1 VIEW COMPARISON:  One-view chest x-ray 09/23/2020 FINDINGS: Heart is enlarged. Bibasilar airspace opacities are again seen, slightly increased bilaterally. Bilateral effusions are present. IMPRESSION: 1. Slight increase in bibasilar airspace opacities compatible COVID 19 infection. 2. Stable bilateral pleural effusions. Electronically Signed   By: Marin Roberts M.D.   On: 09/24/2020 11:36   ECHOCARDIOGRAM COMPLETE  Result Date: 08/29/2020    ECHOCARDIOGRAM REPORT   Patient Name:   Raymond White Date of Exam: 08/29/2020 Medical Rec #:  [id number]         Height:       73.0 in Accession #:    [id number]        Weight:       227.7 lb Date of Birth:  03/06/1951         BSA:          2.274 m Patient Age:    69 years          BP:           102/59 mmHg Patient Gender: M                 HR:           64 bpm. Exam Location:  Inpatient Procedure: 2D Echo Indications:    dyspnea.  History:        Patient has prior history of Echocardiogram examinations, most                 recent 08/29/2020. CHF, Parkinson's; Risk Factors:Hypertension.  Sonographer:    Delcie Roch Referring Phys: [id number] DAYNA N DUNN  Sonographer Comments: Suboptimal subcostal window. IMPRESSIONS  1. Left ventricular ejection fraction, by estimation, is 55 to 60%. The left ventricle has normal function.  The left ventricle has no regional wall motion abnormalities. Left ventricular diastolic parameters are consistent with Grade I diastolic dysfunction (impaired relaxation).  2. Right ventricular systolic function is normal. The right ventricular size is normal. There is normal pulmonary artery systolic pressure.  3. The mitral valve is normal in structure. Trivial mitral valve regurgitation. No evidence of mitral stenosis.  4. The aortic valve is normal in structure. Aortic valve regurgitation is mild. No aortic stenosis is present.  5. Aortic dilatation noted. There is mild dilatation of the ascending aorta, measuring 39 mm. There is mild dilatation at the level of the sinuses of Valsalva, measuring 38 mm.  6. The inferior vena cava is normal in size with greater than 50% respiratory variability, suggesting right atrial pressure of 3 mmHg. FINDINGS  Left Ventricle: Left ventricular ejection fraction, by  estimation, is 55 to 60%. The left ventricle has normal function. The left ventricle has no regional wall motion abnormalities. The left ventricular internal cavity size was normal in size. There is  no left ventricular hypertrophy. Left ventricular diastolic parameters are consistent with Grade I diastolic dysfunction (impaired relaxation). Normal left ventricular filling pressure. Right Ventricle: The right ventricular size is normal. No increase in right ventricular wall thickness. Right ventricular systolic function is normal. There is normal pulmonary artery systolic pressure. The tricuspid regurgitant velocity is 2.74 m/s, and  with an assumed right atrial pressure of 3 mmHg, the estimated right ventricular systolic pressure is 33.0 mmHg. Left Atrium: Left atrial size was normal in size. Right Atrium: Right atrial size was normal in size. Pericardium: There is no evidence of pericardial effusion. Mitral Valve: The mitral valve is normal in structure. Mild mitral annular calcification. Trivial mitral valve  regurgitation. No evidence of mitral valve stenosis. Tricuspid Valve: The tricuspid valve is normal in structure. Tricuspid valve regurgitation is trivial. No evidence of tricuspid stenosis. Aortic Valve: The aortic valve is normal in structure. Aortic valve regurgitation is mild. No aortic stenosis is present. Pulmonic Valve: The pulmonic valve was normal in structure. Pulmonic valve regurgitation is trivial. No evidence of pulmonic stenosis. Aorta: Aortic dilatation noted. There is mild dilatation of the ascending aorta, measuring 39 mm. There is mild dilatation at the level of the sinuses of Valsalva, measuring 38 mm. Venous: The inferior vena cava is normal in size with greater than 50% respiratory variability, suggesting right atrial pressure of 3 mmHg. IAS/Shunts: No atrial level shunt detected by color flow Doppler.  LEFT VENTRICLE PLAX 2D LVIDd:         5.20 cm  Diastology LVIDs:         3.50 cm  LV e' medial:    7.07 cm/s LV PW:         1.00 cm  LV E/e' medial:  7.8 LV IVS:        1.10 cm  LV e' lateral:   10.20 cm/s LVOT diam:     2.20 cm  LV E/e' lateral: 5.4 LV SV:         65 LV SV Index:   29 LVOT Area:     3.80 cm  RIGHT VENTRICLE RV S prime:     12.20 cm/s TAPSE (M-mode): 1.9 cm LEFT ATRIUM             Index LA diam:        4.40 cm 1.94 cm/m LA Vol (A2C):   67.0 ml 29.47 ml/m LA Vol (A4C):   73.2 ml 32.20 ml/m LA Biplane Vol: 74.8 ml 32.90 ml/m  AORTIC VALVE LVOT Vmax:   78.30 cm/s LVOT Vmean:  52.300 cm/s LVOT VTI:    0.171 m  AORTA Ao Root diam: 3.80 cm Ao Asc diam:  3.90 cm MITRAL VALVE               TRICUSPID VALVE MV Area (PHT): 4.21 cm    TR Peak grad:   30.0 mmHg MV Decel Time: 180 msec    TR Vmax:        274.00 cm/s MV E velocity: 54.80 cm/s MV A velocity: 66.40 cm/s  SHUNTS MV E/A ratio:  0.83        Systemic VTI:  0.17 m  Systemic Diam: 2.20 cm Armanda Magic MD Electronically signed by Armanda Magic MD Signature Date/Time: 08/29/2020/1:08:28 PM    Final       TODAY-DAY OF DISCHARGE:  Subjective:   Raymond White today has no headache,no chest abdominal pain,no new weakness tingling or numbness, feels much better wants to go home today.   Objective:   Blood pressure 124/74, pulse 60, temperature 97.8 F (36.6 C), temperature source Oral, resp. rate 16, height 6\' 1"  (1.854 m), weight 102.6 kg, SpO2 95 %.  Intake/Output Summary (Last 24 hours) at 09/27/2020 0936 Last data filed at 09/27/2020 0424 Gross per 24 hour  Intake 420 ml  Output 675 ml  Net -255 ml   Filed Weights   09/23/20 0748 09/27/20 0422  Weight: 104.2 kg 102.6 kg    Exam: Awake Alert, Oriented *3, No new F.N deficits, Normal affect [place].AT,PERRAL Supple Neck,No JVD, No cervical lymphadenopathy appriciated.  Symmetrical Chest wall movement, Good air movement bilaterally, CTAB RRR,No Gallops,Rubs or new Murmurs, No Parasternal Heave +ve B.Sounds, Abd Soft, Non tender, No organomegaly appriciated, No rebound -guarding or rigidity. No Cyanosis, Clubbing or edema, No new Rash or bruise   PERTINENT RADIOLOGIC STUDIES: CT HEAD WO CONTRAST  Result Date: 09/23/2020 CLINICAL DATA:  Minor head trauma EXAM: CT HEAD WITHOUT CONTRAST CT CERVICAL SPINE WITHOUT CONTRAST TECHNIQUE: Multidetector CT imaging of the head and cervical spine was performed following the standard protocol without intravenous contrast. Multiplanar CT image reconstructions of the cervical spine were also generated. COMPARISON:  None. FINDINGS: CT HEAD FINDINGS Brain: No evidence of acute infarction, hemorrhage, hydrocephalus, extra-axial collection or mass lesion/mass effect. Generalized atrophy. Vascular: No hyperdense vessel or unexpected calcification. Skull: Normal. Negative for fracture or focal lesion. Sinuses/Orbits: No acute finding. CT CERVICAL SPINE FINDINGS Alignment: Straightening of the cervical spine. Skull base and vertebrae: No acute fracture or focal bone lesion. Soft tissues and spinal canal: No  prevertebral fluid or swelling. No visible canal hematoma. Disc levels: C4-5 and C5-6 solid arthrodesis. Disc degeneration is diffuse and advanced. 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 There is facet ankylosis at C2-3 on the left. Upper chest: No evidence of injury IMPRESSION: No evidence of acute intracranial or cervical spine injury. Electronically Signed   By: Marnee Spring M.D.   On: 09/23/2020 09:37   CT CERVICAL SPINE WO CONTRAST  Result Date: 09/23/2020 CLINICAL DATA:  Minor head trauma EXAM: CT HEAD WITHOUT CONTRAST CT CERVICAL SPINE WITHOUT CONTRAST TECHNIQUE: Multidetector CT imaging of the head and cervical spine was performed following the standard protocol without intravenous contrast. Multiplanar CT image reconstructions of the cervical spine were also generated. COMPARISON:  None. FINDINGS: CT HEAD FINDINGS Brain: No evidence of acute infarction, hemorrhage, hydrocephalus, extra-axial collection or mass lesion/mass effect. Generalized atrophy. Vascular: No hyperdense vessel or unexpected calcification. Skull: Normal. Negative for fracture or focal lesion. Sinuses/Orbits: No acute finding. CT CERVICAL SPINE FINDINGS Alignment: Straightening of the cervical spine. Skull base and vertebrae: No acute fracture or focal bone lesion. Soft tissues and spinal canal: No prevertebral fluid or swelling. No visible canal hematoma. Disc levels: C4-5 and C5-6 solid arthrodesis. Disc degeneration is diffuse and advanced. 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 There is facet ankylosis at C2-3 on the left. Upper chest: No evidence of injury IMPRESSION: No evidence of acute intracranial or cervical spine injury. Electronically Signed   By: Marnee Spring M.D.   On: 09/23/2020 09:37   MR Cervical Spine Wo Contrast  Result Date: 09/23/2020 CLINICAL DATA:  Multiple trauma.  Fell downstairs. EXAM: MRI CERVICAL, THORACIC AND LUMBAR SPINE WITHOUT CONTRAST TECHNIQUE: Multiplanar and multiecho  pulse sequences of the cervical spine, to include the craniocervical junction and cervicothoracic junction, and thoracic and lumbar spine, were obtained without intravenous contrast. COMPARISON:  CT scans, same date. FINDINGS: MRI CERVICAL SPINE FINDINGS Exam is quite limited due to patient motion. Alignment: Normal Vertebrae: No acute bony findings. No definite fracture or bone lesion. Cord: Grossly normal.  No obvious cord lesion or cord hematoma. Posterior Fossa, vertebral arteries, paraspinal tissues: No significant findings. Disc levels: Multilevel disc disease and facet disease but no significant canal compromise or canal hematoma. MRI THORACIC SPINE FINDINGS Alignment: Scoliosis but normal overall alignment in the sagittal plane. Vertebrae: No acute thoracic spine fractures are identified. The facets are normally aligned. No facet fractures. Cord: No obvious cord lesions or cord hematoma. No intraspinal compressive hematoma. Paraspinal and other soft tissues: No significant findings. Disc levels: Multilevel disc disease and facet disease. Shallow multilevel disc protrusions and areas of osteophytic ridging but no significant canal compromise. MRI LUMBAR SPINE FINDINGS Segmentation: There are five lumbar type vertebral bodies. The last full intervertebral disc space is labeled L5-S1. Alignment: Degenerative lumbar spondylosis with multilevel degenerative listhesis. Vertebrae: No acute bony findings. No fracture or worrisome bone lesion. Conus medullaris and cauda equina: Conus extends to the L1. level. Conus and cauda equina appear normal. Paraspinal and other soft tissues: No significant paraspinal or retroperitoneal findings. Right renal cyst is noted. Disc levels: T12-L1: Focal right paracentral disc protrusion with mass effect on the thecal sac potentially involving the right L1 nerve root. No spinal stenosis. L1-2: No significant findings. L2-3: Wide decompressive laminectomy. Degenerative retrolisthesis  of L2 compared to L3 but no significant spinal or lateral recess stenosis. Mild foraminal narrowing bilaterally. L3-4: Wide decompressive laminectomy. No significant spinal or foraminal stenosis. L4-5: Wide decompressive laminectomy. Mild bilateral foraminal stenosis but no significant spinal stenosis. Mild right lateral recess stenosis. L5-S1: Mild bilateral foraminal stenosis but no spinal or lateral recess stenosis. Advanced facet disease. IMPRESSION: 1. Very limited MR examination of the cervical spine but no obvious cord lesion, cord hematoma or epidural hematoma. 2. Degenerative changes involving the thoracic spine but no acute fracture, cord lesion or spinal canal compromise. 3. Focal right paracentral disc protrusion at T12-L1 potentially involving the right L1 nerve root. 4. Advanced degenerative changes involving the lumbar spine but no significant canal compromise. 5. Postoperative changes involving the lumbar spine with wide decompressive laminectomies. Electronically Signed   By: Rudie Meyer M.D.   On: 09/23/2020 13:21   MR THORACIC SPINE WO CONTRAST  Result Date: 09/23/2020 CLINICAL DATA:  Multiple trauma.  Fell downstairs. EXAM: MRI CERVICAL, THORACIC AND LUMBAR SPINE WITHOUT CONTRAST TECHNIQUE: Multiplanar and multiecho pulse sequences of the cervical spine, to include the craniocervical junction and cervicothoracic junction, and thoracic and lumbar spine, were obtained without intravenous contrast. COMPARISON:  CT scans, same date. FINDINGS: MRI CERVICAL SPINE FINDINGS Exam is quite limited due to patient motion. Alignment: Normal Vertebrae: No acute bony findings. No definite fracture or bone lesion. Cord: Grossly normal.  No obvious cord lesion or cord hematoma. Posterior Fossa, vertebral arteries, paraspinal tissues: No significant findings. Disc levels: Multilevel disc disease and facet disease but no significant canal compromise or canal hematoma. MRI THORACIC SPINE FINDINGS Alignment:  Scoliosis but normal overall alignment in the sagittal plane. Vertebrae: No acute thoracic spine fractures are identified. The facets are normally aligned. No facet fractures. Cord: No obvious  cord lesions or cord hematoma. No intraspinal compressive hematoma. Paraspinal and other soft tissues: No significant findings. Disc levels: Multilevel disc disease and facet disease. Shallow multilevel disc protrusions and areas of osteophytic ridging but no significant canal compromise. MRI LUMBAR SPINE FINDINGS Segmentation: There are five lumbar type vertebral bodies. The last full intervertebral disc space is labeled L5-S1. Alignment: Degenerative lumbar spondylosis with multilevel degenerative listhesis. Vertebrae: No acute bony findings. No fracture or worrisome bone lesion. Conus medullaris and cauda equina: Conus extends to the L1. level. Conus and cauda equina appear normal. Paraspinal and other soft tissues: No significant paraspinal or retroperitoneal findings. Right renal cyst is noted. Disc levels: T12-L1: Focal right paracentral disc protrusion with mass effect on the thecal sac potentially involving the right L1 nerve root. No spinal stenosis. L1-2: No significant findings. L2-3: Wide decompressive laminectomy. Degenerative retrolisthesis of L2 compared to L3 but no significant spinal or lateral recess stenosis. Mild foraminal narrowing bilaterally. L3-4: Wide decompressive laminectomy. No significant spinal or foraminal stenosis. L4-5: Wide decompressive laminectomy. Mild bilateral foraminal stenosis but no significant spinal stenosis. Mild right lateral recess stenosis. L5-S1: Mild bilateral foraminal stenosis but no spinal or lateral recess stenosis. Advanced facet disease. IMPRESSION: 1. Very limited MR examination of the cervical spine but no obvious cord lesion, cord hematoma or epidural hematoma. 2. Degenerative changes involving the thoracic spine but no acute fracture, cord lesion or spinal canal  compromise. 3. Focal right paracentral disc protrusion at T12-L1 potentially involving the right L1 nerve root. 4. Advanced degenerative changes involving the lumbar spine but no significant canal compromise. 5. Postoperative changes involving the lumbar spine with wide decompressive laminectomies. Electronically Signed   By: Rudie Meyer M.D.   On: 09/23/2020 13:21   MR THORACIC SPINE WO CONTRAST  Result Date: 08/30/2020 CLINICAL DATA:  Recent fall. Fecal incontinence with lower extremity numbness. EXAM: MRI THORACIC SPINE WITHOUT CONTRAST TECHNIQUE: Multiplanar, multisequence MR imaging of the thoracic spine was performed. No intravenous contrast was administered. COMPARISON:  None. FINDINGS: Alignment:  Physiologic. Vertebrae: There is edema within the proximal left ribs and the left lamina and pars interarticularis of T8 and T9. Edema in the right laminae is present to a lesser extent. No acute fracture. Cord:  Normal signal and morphology. Paraspinal and other soft tissues: Negative. Disc levels: There are small disc protrusions at all levels without spinal canal stenosis. There is posterior vertebral body fusion at T11-12. IMPRESSION: 1. Edema within the proximal left ribs and the left posterior elements of T8 and T9. This could indicate fracture or contusion. CT of the thoracic spine may be helpful further assessment. 2. Small disc protrusions at all levels without spinal canal stenosis. Electronically Signed   By: Deatra Robinson M.D.   On: 08/30/2020 22:50   MR LUMBAR SPINE WO CONTRAST  Result Date: 09/23/2020 CLINICAL DATA:  Multiple trauma.  Fell downstairs. EXAM: MRI CERVICAL, THORACIC AND LUMBAR SPINE WITHOUT CONTRAST TECHNIQUE: Multiplanar and multiecho pulse sequences of the cervical spine, to include the craniocervical junction and cervicothoracic junction, and thoracic and lumbar spine, were obtained without intravenous contrast. COMPARISON:  CT scans, same date. FINDINGS: MRI CERVICAL SPINE  FINDINGS Exam is quite limited due to patient motion. Alignment: Normal Vertebrae: No acute bony findings. No definite fracture or bone lesion. Cord: Grossly normal.  No obvious cord lesion or cord hematoma. Posterior Fossa, vertebral arteries, paraspinal tissues: No significant findings. Disc levels: Multilevel disc disease and facet disease but no significant canal compromise or canal hematoma.  MRI THORACIC SPINE FINDINGS Alignment: Scoliosis but normal overall alignment in the sagittal plane. Vertebrae: No acute thoracic spine fractures are identified. The facets are normally aligned. No facet fractures. Cord: No obvious cord lesions or cord hematoma. No intraspinal compressive hematoma. Paraspinal and other soft tissues: No significant findings. Disc levels: Multilevel disc disease and facet disease. Shallow multilevel disc protrusions and areas of osteophytic ridging but no significant canal compromise. MRI LUMBAR SPINE FINDINGS Segmentation: There are five lumbar type vertebral bodies. The last full intervertebral disc space is labeled L5-S1. Alignment: Degenerative lumbar spondylosis with multilevel degenerative listhesis. Vertebrae: No acute bony findings. No fracture or worrisome bone lesion. Conus medullaris and cauda equina: Conus extends to the L1. level. Conus and cauda equina appear normal. Paraspinal and other soft tissues: No significant paraspinal or retroperitoneal findings. Right renal cyst is noted. Disc levels: T12-L1: Focal right paracentral disc protrusion with mass effect on the thecal sac potentially involving the right L1 nerve root. No spinal stenosis. L1-2: No significant findings. L2-3: Wide decompressive laminectomy. Degenerative retrolisthesis of L2 compared to L3 but no significant spinal or lateral recess stenosis. Mild foraminal narrowing bilaterally. L3-4: Wide decompressive laminectomy. No significant spinal or foraminal stenosis. L4-5: Wide decompressive laminectomy. Mild  bilateral foraminal stenosis but no significant spinal stenosis. Mild right lateral recess stenosis. L5-S1: Mild bilateral foraminal stenosis but no spinal or lateral recess stenosis. Advanced facet disease. IMPRESSION: 1. Very limited MR examination of the cervical spine but no obvious cord lesion, cord hematoma or epidural hematoma. 2. Degenerative changes involving the thoracic spine but no acute fracture, cord lesion or spinal canal compromise. 3. Focal right paracentral disc protrusion at T12-L1 potentially involving the right L1 nerve root. 4. Advanced degenerative changes involving the lumbar spine but no significant canal compromise. 5. Postoperative changes involving the lumbar spine with wide decompressive laminectomies. Electronically Signed   By: Rudie Meyer M.D.   On: 09/23/2020 13:21   DG Pelvis Portable  Result Date: 09/23/2020 CLINICAL DATA:  Fall, pelvic pain EXAM: PORTABLE PELVIS 1-2 VIEWS COMPARISON:  None. FINDINGS: There is no evidence of pelvic fracture or diastasis. No pelvic bone lesions are seen. Degenerative changes are seen within the a lumbosacral junction, not well assessed on this exam. IMPRESSION: Negative. Electronically Signed   By: Helyn Numbers MD   On: 09/23/2020 08:31   CT CHEST ABDOMEN PELVIS W CONTRAST  Result Date: 09/23/2020 CLINICAL DATA:  Fall down steps EXAM: CT CHEST, ABDOMEN, AND PELVIS WITH CONTRAST TECHNIQUE: Multidetector CT imaging of the chest, abdomen and pelvis was performed following the standard protocol during bolus administration of intravenous contrast. CONTRAST:  OMNIPAQUE IOHEXOL 300 MG/ML  SOLN COMPARISON:  August 26, 2020 FINDINGS: CT CHEST FINDINGS Cardiovascular: No evidence of acute aortic injury with limited evaluation of the aortic root secondary to cardiac motion. LEFT-sided coronary artery atherosclerotic calcifications heart is mildly enlarged. No pericardial effusion. Scattered atherosclerotic calcifications throughout the aorta.  Mediastinum/Nodes: Thyroid is unremarkable. No suspicious axillary or mediastinal lymph nodes. Lungs/Pleura: No pleural effusion or pneumothorax. Revisualization of elevation of the RIGHT hemidiaphragm with chronic calcific densities and scarring overlying the RIGHT hemidiaphragm. There are new peripheral predominant ground-glass opacities throughout bilateral lungs. Musculoskeletal: Remote RIGHT-sided rib fractures. There is fat stranding overlying the RIGHT lateral soft tissues, partially excluded from the field of view. No evidence of active contrast extravasation. Multilevel degenerative changes of the thoracic spine better assessed on contemporaneous dedicated CT. CT ABDOMEN PELVIS FINDINGS Hepatobiliary: Status post cholecystectomy. Unchanged central biliary  prominence, likely due to post cholecystectomy state. No evidence of hepatic laceration. Pancreas: No peripancreatic fat stranding. Spleen: No splenic injury or perisplenic hematoma. Adrenals/Urinary Tract: Adrenal glands are unremarkable. Kidneys enhance symmetrically. No evidence of renal laceration. RIGHT-sided renal cysts. Additional subcentimeter hypodense lesions are too small to accurately characterize. Bladder is decompressed with circumferentially thickened walls; this likely reflects a sequela of chronic outlet obstruction. Stomach/Bowel: Hiatal hernia with a LEFT-sided esophageal diverticulum, unchanged. No evidence of bowel obstruction. Scattered diverticulosis. Vascular/Lymphatic: Mild atherosclerotic calcifications of the aorta. No suspicious lymphadenopathy. 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 Other: Fat containing LEFT inguinal hernia.  No free fluid. Musculoskeletal: Multilevel degenerative changes of the lumbar spine better assessed on contemporaneous CT. IMPRESSION: 1. No evidence of solid organ or aortic injury. Fat stranding along the RIGHT lateral chest wall and flank likely reflects contusion/bruising. 2. Peripheral  predominant ground-glass opacities likely reflect atypical infection such as COVID 19 infection. 3. Please see separately dictated report regarding thoracolumbar spine CT. Aortic Atherosclerosis (ICD10-I70.0). Electronically Signed   By: Meda Klinefelter MD   On: 09/23/2020 10:13   CT T-SPINE NO CHARGE  Result Date: 09/23/2020 CLINICAL DATA:  Larey Seat downstairs.  Back pain. EXAM: CT THORACIC AND LUMBAR SPINE WITHOUT CONTRAST TECHNIQUE: Multidetector CT imaging of the thoracic and lumbar spine was performed without contrast. Multiplanar CT image reconstructions were also generated. COMPARISON:  MRI thoracic spine 08/30/2020 and MRI lumbar spine 08/26/2020. FINDINGS: CT THORACIC SPINE FINDINGS Alignment: Stable right convex thoracic scoliosis. Normal overall alignment of the thoracic vertebral bodies in the sagittal plane. Vertebrae: No acute thoracic spine fracture is identified. Stable degenerative changes. The facets are normally aligned. Paraspinal and other soft tissues: No significant paraspinal findings. Dependent bibasilar atelectasis is noted. No mediastinal mass or hematoma. No paraspinal hematoma. The visualized posterior ribs are intact. Disc levels: No large disc protrusions, significant spinal or foraminal stenosis. No intraspinal hematoma. CT LUMBAR SPINE FINDINGS Segmentation: There are five lumbar type vertebral bodies. The last full intervertebral disc space is labeled L5-S1. Alignment: Normal overall alignment. 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 Vertebrae: No acute lumbar spine fracture. Postoperative changes with wide decompressive laminectomies. No worrisome bone lesions. Paraspinal and other soft tissues: No significant paraspinal or retroperitoneal findings. Moderate aortic calcifications but no aneurysm. A right renal cyst is noted. Disc levels: Decompressive laminectomies without significant canal stenosis. No hematoma or abnormal fluid  collections. The upper sacrum is intact and the SI joints are intact. IMPRESSION: 1. No acute thoracic or lumbar spine fracture is identified. 2. Stable right convex thoracic scoliosis. 3. 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 4. Postoperative changes involving the lumbar spine with wide decompressive laminectomies. Aortic Atherosclerosis (ICD10-I70.0). Electronically Signed   By: Rudie Meyer M.D.   On: 09/23/2020 10:08   CT L-SPINE NO CHARGE  Result Date: 09/23/2020 CLINICAL DATA:  Larey Seat downstairs.  Back pain. EXAM: CT THORACIC AND LUMBAR SPINE WITHOUT CONTRAST TECHNIQUE: Multidetector CT imaging of the thoracic and lumbar spine was performed without contrast. Multiplanar CT image reconstructions were also generated. COMPARISON:  MRI thoracic spine 08/30/2020 and MRI lumbar spine 08/26/2020. FINDINGS: CT THORACIC SPINE FINDINGS Alignment: Stable right convex thoracic scoliosis. Normal overall alignment of the thoracic vertebral bodies in the sagittal plane. Vertebrae: No acute thoracic spine fracture is identified. Stable degenerative changes. The facets are normally aligned. Paraspinal and other soft tissues: No significant paraspinal findings. Dependent bibasilar atelectasis is noted. No mediastinal mass  or hematoma. No paraspinal hematoma. The visualized posterior ribs are intact. Disc levels: No large disc protrusions, significant spinal or foraminal stenosis. No intraspinal hematoma. CT LUMBAR SPINE FINDINGS Segmentation: There are five lumbar type vertebral bodies. The last full intervertebral disc space is labeled L5-S1. Alignment: Normal overall alignment. 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 Vertebrae: No acute lumbar spine fracture. Postoperative changes with wide decompressive laminectomies. No worrisome bone lesions. Paraspinal and other soft tissues: No significant paraspinal or retroperitoneal  findings. Moderate aortic calcifications but no aneurysm. A right renal cyst is noted. Disc levels: Decompressive laminectomies without significant canal stenosis. No hematoma or abnormal fluid collections. The upper sacrum is intact and the SI joints are intact. IMPRESSION: 1. No acute thoracic or lumbar spine fracture is identified. 2. Stable right convex thoracic scoliosis. 3. 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 4. Postoperative changes involving the lumbar spine with wide decompressive laminectomies. Aortic Atherosclerosis (ICD10-I70.0). Electronically Signed   By: Rudie Meyer M.D.   On: 09/23/2020 10:08   DG Chest Port 1 View  Result Date: 09/23/2020 CLINICAL DATA:  Chest pain after fall. EXAM: PORTABLE CHEST 1 VIEW COMPARISON:  August 28, 2020. FINDINGS: The heart size and mediastinal contours are within normal limits. No pneumothorax is noted. Mild bibasilar subsegmental atelectasis or infiltrates are noted with small right pleural effusion. The visualized skeletal structures are unremarkable. IMPRESSION: Mild bibasilar subsegmental atelectasis or infiltrates are noted with small right pleural effusion. Electronically Signed   By: Lupita Raider M.D.   On: 09/23/2020 08:41   DG Chest Port 1 View  Result Date: 08/28/2020 CLINICAL DATA:  Hypoxia EXAM: PORTABLE CHEST 1 VIEW COMPARISON:  August 26, 2020 FINDINGS: The cardiomediastinal silhouette is stable. No pneumothorax. Right basilar atelectasis, unchanged. Elevation right hemidiaphragm identified. Mild opacity in the left base is new in the interval, favored to represent atelectasis. Air under the right hemidiaphragm is noted to be interposed colon seen on previous CT imaging. No other acute abnormalities. IMPRESSION: 1. New mild opacity in the left base, favored to represent atelectasis. 2. Stable right basilar atelectasis and elevation of the right hemidiaphragm. Electronically Signed   By: Gerome Sam III M.D   On: 08/28/2020 19:17   DG Chest Port 1V same Day  Result Date: 09/24/2020 CLINICAL DATA:  Shortness of breath.  COVID positive.  Fall at home. EXAM: PORTABLE CHEST 1 VIEW COMPARISON:  One-view chest x-ray 09/23/2020 FINDINGS: Heart is enlarged. Bibasilar airspace opacities are again seen, slightly increased bilaterally. Bilateral effusions are present. IMPRESSION: 1. Slight increase in bibasilar airspace opacities compatible COVID 19 infection. 2. Stable bilateral pleural effusions. Electronically Signed   By: [place] Roberts M.D.   On: 09/24/2020 11:36   ECHOCARDIOGRAM COMPLETE  Result Date: 08/29/2020    ECHOCARDIOGRAM REPORT   Patient Name:   Raymond White Date of Exam: 08/29/2020 Medical Rec #:  [id number]         Height:       73.0 in Accession #:    [id number]        Weight:       227.7 lb Date of Birth:  06/18/1951         BSA:          2.274 m Patient Age:    69 years          BP:           102/59 mmHg Patient Gender:  M                 HR:           64 bpm. Exam Location:  Inpatient Procedure: 2D Echo Indications:    dyspnea.  History:        Patient has prior history of Echocardiogram examinations, most                 recent 08/29/2020. CHF, Parkinson's; Risk Factors:Hypertension.  Sonographer:    Delcie Roch Referring Phys: 47 DAYNA N DUNN  Sonographer Comments: Suboptimal subcostal window. IMPRESSIONS  1. Left ventricular ejection fraction, by estimation, is 55 to 60%. The left ventricle has normal function. The left ventricle has no regional wall motion abnormalities. Left ventricular diastolic parameters are consistent with Grade I diastolic dysfunction (impaired relaxation).  2. Right ventricular systolic function is normal. The right ventricular size is normal. There is normal pulmonary artery systolic pressure.  3. The mitral valve is normal in structure. Trivial mitral valve regurgitation. No evidence of mitral stenosis.  4. The aortic valve is normal in  structure. Aortic valve regurgitation is mild. No aortic stenosis is present.  5. Aortic dilatation noted. There is mild dilatation of the ascending aorta, measuring 39 mm. There is mild dilatation at the level of the sinuses of Valsalva, measuring 38 mm.  6. The inferior vena cava is normal in size with greater than 50% respiratory variability, suggesting right atrial pressure of 3 mmHg. FINDINGS  Left Ventricle: Left ventricular ejection fraction, by estimation, is 55 to 60%. The left ventricle has normal function. The left ventricle has no regional wall motion abnormalities. The left ventricular internal cavity size was normal in size. There is  no left ventricular hypertrophy. Left ventricular diastolic parameters are consistent with Grade I diastolic dysfunction (impaired relaxation). Normal left ventricular filling pressure. Right Ventricle: The right ventricular size is normal. No increase in right ventricular wall thickness. Right ventricular systolic function is normal. There is normal pulmonary artery systolic pressure. The tricuspid regurgitant velocity is 2.74 m/s, and  with an assumed right atrial pressure of 3 mmHg, the estimated right ventricular systolic pressure is 33.0 mmHg. Left Atrium: Left atrial size was normal in size. Right Atrium: Right atrial size was normal in size. Pericardium: There is no evidence of pericardial effusion. Mitral Valve: The mitral valve is normal in structure. Mild mitral annular calcification. Trivial mitral valve regurgitation. No evidence of mitral valve stenosis. Tricuspid Valve: The tricuspid valve is normal in structure. Tricuspid valve regurgitation is trivial. No evidence of tricuspid stenosis. Aortic Valve: The aortic valve is normal in structure. Aortic valve regurgitation is mild. No aortic stenosis is present. Pulmonic Valve: The pulmonic valve was normal in structure. Pulmonic valve regurgitation is trivial. No evidence of pulmonic stenosis. Aorta: Aortic  dilatation noted. There is mild dilatation of the ascending aorta, measuring 39 mm. There is mild dilatation at the level of the sinuses of Valsalva, measuring 38 mm. Venous: The inferior vena cava is normal in size with greater than 50% respiratory variability, suggesting right atrial pressure of 3 mmHg. IAS/Shunts: No atrial level shunt detected by color flow Doppler.  LEFT VENTRICLE PLAX 2D LVIDd:         5.20 cm  Diastology LVIDs:         3.50 cm  LV e' medial:    7.07 cm/s LV PW:         1.00 cm  LV E/e' medial:  7.8 LV IVS:  1.10 cm  LV e' lateral:   10.20 cm/s LVOT diam:     2.20 cm  LV E/e' lateral: 5.4 LV SV:         65 LV SV Index:   29 LVOT Area:     3.80 cm  RIGHT VENTRICLE RV S prime:     12.20 cm/s TAPSE (M-mode): 1.9 cm LEFT ATRIUM             Index LA diam:        4.40 cm 1.94 cm/m LA Vol (A2C):   67.0 ml 29.47 ml/m LA Vol (A4C):   73.2 ml 32.20 ml/m LA Biplane Vol: 74.8 ml 32.90 ml/m  AORTIC VALVE LVOT Vmax:   78.30 cm/s LVOT Vmean:  52.300 cm/s LVOT VTI:    0.171 m  AORTA Ao Root diam: 3.80 cm Ao Asc diam:  3.90 cm MITRAL VALVE               TRICUSPID VALVE MV Area (PHT): 4.21 cm    TR Peak grad:   30.0 mmHg MV Decel Time: 180 msec    TR Vmax:        274.00 cm/s MV E velocity: 54.80 cm/s MV A velocity: 66.40 cm/s  SHUNTS MV E/A ratio:  0.83        Systemic VTI:  0.17 m                            Systemic Diam: 2.20 cm Armanda Magic MD Electronically signed by Armanda Magic MD Signature Date/Time: 08/29/2020/1:08:28 PM    Final      PERTINENT LAB RESULTS: CBC: Recent Labs    09/26/20 0425 09/27/20 0224  WBC 5.2 5.6  HGB 13.9 13.6  HCT 41.2 42.2  PLT 114* 125*   CMET CMP     Component Value Date/Time   NA 137 09/27/2020 0228   NA 144 12/17/2017 0000   K 4.7 09/27/2020 0228   CL 108 09/27/2020 0228   CO2 20 (L) 09/27/2020 0228   GLUCOSE 142 (H) 09/27/2020 0228   BUN 17 09/27/2020 0228   BUN 11 12/17/2017 0000   CREATININE 0.66 09/27/2020 0228   CALCIUM 8.2 (L)  09/27/2020 0228   PROT 5.2 (L) 09/27/2020 0228   ALBUMIN 2.9 (L) 09/27/2020 0228   AST 55 (H) 09/27/2020 0228   ALT 23 09/27/2020 0228   ALKPHOS 44 09/27/2020 0228   BILITOT 1.1 09/27/2020 0228   GFRNONAA >60 09/27/2020 0228   GFRAA >60 03/31/2020 0146    GFR Estimated Creatinine Clearance: 109.7 mL/min (by C-G formula based on SCr of 0.66 mg/dL). No results for input(s): LIPASE, AMYLASE in the last 72 hours. No results for input(s): CKTOTAL, CKMB, CKMBINDEX, TROPONINI in the last 72 hours. Invalid input(s): POCBNP Recent Labs    09/26/20 0425 09/27/20 0228  DDIMER <0.27 0.32   No results for input(s): HGBA1C in the last 72 hours. No results for input(s): CHOL, HDL, LDLCALC, TRIG, CHOLHDL, LDLDIRECT in the last 72 hours. No results for input(s): TSH, T4TOTAL, T3FREE, THYROIDAB in the last 72 hours.  Invalid input(s): FREET3 Recent Labs    09/26/20 0425 09/27/20 0228  FERRITIN 318 389*   Coags: No results for input(s): INR in the last 72 hours.  Invalid input(s): PT Microbiology: Recent Results (from the past 240 hour(s))  SARS Coronavirus 2 by RT PCR (hospital order, performed in [facility] hospital lab) Nasopharyngeal Nasopharyngeal Swab  Status: Abnormal   Collection Time: 09/23/20  8:18 AM   Specimen: Nasopharyngeal Swab  Result Value Ref Range Status   SARS Coronavirus 2 POSITIVE (A) NEGATIVE Final    Comment: RESULT CALLED TO, READ BACK BY AND VERIFIED WITH: RN E.BANKS AT 4098 ON 09/23/2020 BY T.SAAD (NOTE) SARS-CoV-2 target nucleic acids are DETECTED  SARS-CoV-2 RNA is generally detectable in upper respiratory specimens  during the acute phase of infection.  Positive results are indicative  of the presence of the identified virus, but do not rule out bacterial infection or co-infection with other pathogens not detected by the test.  Clinical correlation with patient history and  other diagnostic information is necessary to determine patient infection  status.  The expected result is negative.  Fact Sheet for Patients:   BoilerBrush.com.cy   Fact Sheet for Healthcare Providers:   https://pope.com/    This test is not yet approved or cleared by the [place] FDA and  has been authorized for detection and/or diagnosis of SARS-CoV-2 by FDA under an Emergency Use Authorization (EUA).  This EUA will remain in effect (meaning  this test can be used) for the duration of  the COVID-19 declaration under Section 564(b)(1) of the Act, 21 U.S.C. section 360-bbb-3(b)(1), unless the authorization is terminated or revoked sooner.  Performed at [facility], [address], [place], [place] [address]     FURTHER DISCHARGE INSTRUCTIONS:  Get Medicines reviewed and adjusted: Please take all your medications with you for your next visit with your Primary MD  Laboratory/radiological data: Please request your Primary MD to go over all hospital tests and procedure/radiological results at the follow up, please ask your Primary MD to get all Hospital records sent to his/her office.  In some cases, they will be blood work, cultures and biopsy results pending at the time of your discharge. Please request that your primary care M.D. goes through all the records of your hospital data and follows up on these results.  Also Note the following: If you experience worsening of your admission symptoms, develop shortness of breath, life threatening emergency, suicidal or homicidal thoughts you must seek medical attention immediately by calling 911 or calling your MD immediately  if symptoms less severe.  You must read complete instructions/literature along with all the possible adverse reactions/side effects for all the Medicines you take and that have been prescribed to you. Take any new Medicines after you have completely understood and accpet all the possible adverse reactions/side effects.   Do not  drive when taking Pain medications or sleeping medications (Benzodaizepines)  Do not take more than prescribed Pain, Sleep and Anxiety Medications. It is not advisable to combine anxiety,sleep and pain medications without talking with your primary care practitioner  Special Instructions: If you have smoked or chewed Tobacco  in the last 2 yrs please stop smoking, stop any regular Alcohol  and or any Recreational drug use.  Wear Seat belts while driving.  Please note: You were cared for by a hospitalist during your hospital stay. Once you are discharged, your primary care physician will handle any further medical issues. Please note that NO REFILLS for any discharge medications will be authorized once you are discharged, as it is imperative that you return to your primary care physician (or establish a relationship with a primary care physician if you do not have one) for your post hospital discharge needs so that they can reassess your need for medications and monitor your lab values.  Total Time spent coordinating discharge including counseling, education and face to face time equals 35 minutes.  SignedJeoffrey Massed 09/27/2020 9:36 AM

## 2020-09-27 NOTE — Care Management Important Message (Signed)
Important Message  Patient Details  Name: Raymond White MRN: [id number] Date of Birth: 22-Jul-1951   Medicare Important Message Given:  Yes - Important Message mailed due to current National Emergency  Verbal consent obtained due to current National Emergency  Relationship to patient: Self Contact Name: Marshal Call Date: 09/27/20  Time: 1406 Phone: [phone] Outcome: No Answer/Busy Important Message mailed to: Patient address on file    Orson Aloe 09/27/2020, 2:06 PM

## 2020-09-27 NOTE — Progress Notes (Signed)
Physical Therapy Treatment Patient Details Name: Raymond White MRN: [id number] DOB: Nov 05, 1950 Today's Date: 09/27/2020    History of Present Illness 70 y.o. male seen in the emergency room today with complaints of a fall down the stairs.  Patient thinks he tripped when his foot got stuck.  Patient is unsure if he lost consciousness. +back pain, numbness/weakness bil LEs. Neurosurgery consult CT and MRI of brain and spine: no acute fractures, +ankylosis, no hematoma,  PMH-LBP with sciatica, CHF, DDD, depression, TIA, HTN, Parkinson's, CVA, urinary incontinence    PT Comments    Patient reporting pain still 8/10, however he is beginning to move better and require less assistance. Tolerated ambulation x 120 ft on room air (at very slow pace) with sats >=90%.    Follow Up Recommendations  SNF (due to lack of 24/7 assist)     Equipment Recommendations  None recommended by PT    Recommendations for Other Services OT consult     Precautions / Restrictions Precautions Precautions: Fall    Mobility  Bed Mobility Overal bed mobility: Modified Independent Bed Mobility: Rolling;Sidelying to Sit Rolling: Modified independent (Device/Increase time) Sidelying to sit: Modified independent (Device/Increase time)       General bed mobility comments: incr time and effort  Transfers Overall transfer level: Needs assistance Equipment used: Rolling walker (2 wheeled) Transfers: Sit to/from Stand Sit to Stand: Min guard         General transfer comment: vc to push off bed and then hands to RW  Ambulation/Gait Ambulation/Gait assistance: Min guard Gait Distance (Feet): 120 Feet Assistive device: Rolling walker (2 wheeled) Gait Pattern/deviations: Step-to pattern;Decreased stride length;Shuffle;Step-through pattern     General Gait Details: better foot clearance with short step length (but improved from previous sessions); vc for proximity to RW and upright posture   Stairs              Wheelchair Mobility    Modified Rankin (Stroke Patients Only)       Balance Overall balance assessment: History of Falls (reports foot catches the tread on the steps multiple times per year (last fell 3 months ago))                                          Cognition Arousal/Alertness: Awake/alert Behavior During Therapy: WFL for tasks assessed/performed Overall Cognitive Status: Within Functional Limits for tasks assessed                                 General Comments: most likely close to baseline level of functioning      Exercises Other Exercises Other Exercises: IS x 5 breaths up to Other Exercises: flutter valve x 5 breaths; pt able to verbalize correct freq for use    General Comments General comments (skin integrity, edema, etc.): Says he is going to try to move so he doesn't have the steps that he fell on.      Pertinent Vitals/Pain Pain Assessment: 0-10 Pain Score: 8  Pain Location: back Pain Descriptors / Indicators: Constant;Grimacing;Guarding;Sharp Pain Intervention(s): Limited activity within patient's tolerance;Monitored during session;Premedicated before session    Home Living                      Prior Function  PT Goals (current goals can now be found in the care plan section) Acute Rehab PT Goals Patient Stated Goal: to get stronger PT Goal Formulation: With patient Time For Goal Achievement: 10/08/20 Potential to Achieve Goals: Fair Progress towards PT goals: Progressing toward goals    Frequency    Min 3X/week      PT Plan Current plan remains appropriate    Co-evaluation              AM-PAC PT "6 Clicks" Mobility   Outcome Measure  Help needed turning from your back to your side while in a flat bed without using bedrails?: None Help needed moving from lying on your back to sitting on the side of a flat bed without using bedrails?: None Help needed  moving to and from a bed to a chair (including a wheelchair)?: A Little Help needed standing up from a chair using your arms (e.g., wheelchair or bedside chair)?: A Little Help needed to walk in hospital room?: A Little Help needed climbing 3-5 steps with a railing? : A Little 6 Click Score: 20    End of Session Equipment Utilized During Treatment: Gait belt Activity Tolerance: Patient tolerated treatment well Patient left: in chair;with call bell/phone within reach;with chair alarm set Nurse Communication: Mobility status PT Visit Diagnosis: Repeated falls (R29.6);Muscle weakness (generalized) (M62.81);Other symptoms and signs involving the nervous system (R29.898)     Time: 5784-6962 PT Time Calculation (min) (ACUTE ONLY): 22 min  Charges:  $Gait Training: 8-22 mins                      Raymond White, PT Pager [phone]    Raymond White 09/27/2020, 10:32 AM

## 2020-10-03 ENCOUNTER — Emergency Department (HOSPITAL_COMMUNITY): Payer: Medicare Other

## 2020-10-03 ENCOUNTER — Emergency Department (HOSPITAL_COMMUNITY)
Admission: EM | Admit: 2020-10-03 | Discharge: 2020-10-04 | Disposition: A | Discharge status: Home / Self Care | Payer: Medicare Other | Attending: Emergency Medicine | Admitting: Emergency Medicine

## 2020-10-03 DIAGNOSIS — G8929 Other chronic pain: Secondary | ICD-10-CM | POA: Insufficient documentation

## 2020-10-03 DIAGNOSIS — Z7902 Long term (current) use of antithrombotics/antiplatelets: Secondary | ICD-10-CM | POA: Insufficient documentation

## 2020-10-03 DIAGNOSIS — R531 Weakness: Secondary | ICD-10-CM | POA: Diagnosis not present

## 2020-10-03 DIAGNOSIS — G20 Parkinson's disease: Secondary | ICD-10-CM | POA: Diagnosis not present

## 2020-10-03 DIAGNOSIS — R200 Anesthesia of skin: Secondary | ICD-10-CM | POA: Insufficient documentation

## 2020-10-03 DIAGNOSIS — Z79899 Other long term (current) drug therapy: Secondary | ICD-10-CM | POA: Insufficient documentation

## 2020-10-03 DIAGNOSIS — M549 Dorsalgia, unspecified: Secondary | ICD-10-CM | POA: Insufficient documentation

## 2020-10-03 DIAGNOSIS — I110 Hypertensive heart disease with heart failure: Secondary | ICD-10-CM | POA: Insufficient documentation

## 2020-10-03 DIAGNOSIS — Z7982 Long term (current) use of aspirin: Secondary | ICD-10-CM | POA: Diagnosis not present

## 2020-10-03 DIAGNOSIS — I5033 Acute on chronic diastolic (congestive) heart failure: Secondary | ICD-10-CM | POA: Insufficient documentation

## 2020-10-03 DIAGNOSIS — M546 Pain in thoracic spine: Secondary | ICD-10-CM | POA: Diagnosis present

## 2020-10-03 DIAGNOSIS — Z8616 Personal history of COVID-19: Secondary | ICD-10-CM | POA: Diagnosis not present

## 2020-10-03 LAB — CBC WITH DIFFERENTIAL/PLATELET
Abs Immature Granulocytes: 0.07 10*3/uL (ref 0.00–0.07)
Basophils Absolute: 0 10*3/uL (ref 0.0–0.1)
Basophils Relative: 0 %
Eosinophils Absolute: 0.1 10*3/uL (ref 0.0–0.5)
Eosinophils Relative: 2 %
HCT: 39.5 % (ref 39.0–52.0)
Hemoglobin: 13.2 g/dL (ref 13.0–17.0)
Immature Granulocytes: 1 %
Lymphocytes Relative: 12 %
Lymphs Abs: 0.8 10*3/uL (ref 0.7–4.0)
MCH: 33.4 pg (ref 26.0–34.0)
MCHC: 33.4 g/dL (ref 30.0–36.0)
MCV: 100 fL (ref 80.0–100.0)
Monocytes Absolute: 0.9 10*3/uL (ref 0.1–1.0)
Monocytes Relative: 15 %
Neutro Abs: 4.4 10*3/uL (ref 1.7–7.7)
Neutrophils Relative %: 70 %
Platelets: 237 10*3/uL (ref 150–400)
RBC: 3.95 MIL/uL — ABNORMAL LOW (ref 4.22–5.81)
RDW: 14 % (ref 11.5–15.5)
WBC: 6.3 10*3/uL (ref 4.0–10.5)
nRBC: 0 % (ref 0.0–0.2)

## 2020-10-03 LAB — URINALYSIS, ROUTINE W REFLEX MICROSCOPIC
Bacteria, UA: NONE SEEN
Bilirubin Urine: NEGATIVE
Glucose, UA: NEGATIVE mg/dL
Hgb urine dipstick: NEGATIVE
Ketones, ur: NEGATIVE mg/dL
Nitrite: NEGATIVE
Protein, ur: NEGATIVE mg/dL
Specific Gravity, Urine: 1.006 (ref 1.005–1.030)
pH: 6 (ref 5.0–8.0)

## 2020-10-03 LAB — COMPREHENSIVE METABOLIC PANEL
ALT: 6 U/L (ref 0–44)
AST: 15 U/L (ref 15–41)
Albumin: 3.2 g/dL — ABNORMAL LOW (ref 3.5–5.0)
Alkaline Phosphatase: 54 U/L (ref 38–126)
Anion gap: 9 (ref 5–15)
BUN: 9 mg/dL (ref 8–23)
CO2: 28 mmol/L (ref 22–32)
Calcium: 8.3 mg/dL — ABNORMAL LOW (ref 8.9–10.3)
Chloride: 98 mmol/L (ref 98–111)
Creatinine, Ser: 0.96 mg/dL (ref 0.61–1.24)
GFR, Estimated: >60 mL/min (ref >60)
Glucose, Bld: 97 mg/dL (ref 70–99)
Potassium: 4.6 mmol/L (ref 3.5–5.1)
Sodium: 135 mmol/L (ref 135–145)
Total Bilirubin: 1.4 mg/dL — ABNORMAL HIGH (ref 0.3–1.2)
Total Protein: 6.1 g/dL — ABNORMAL LOW (ref 6.5–8.1)

## 2020-10-03 MED ORDER — LORAZEPAM 2 MG/ML IJ SOLN
0.5000 mg | INTRAMUSCULAR | Status: DC | PRN
  Administered 2020-10-03: 0.5 mg via INTRAVENOUS
  Filled 2020-10-03: qty 1

## 2020-10-03 MED ORDER — LIP MEDEX EX OINT
TOPICAL_OINTMENT | Freq: Once | CUTANEOUS | Status: AC
  Filled 2020-10-03: qty 7

## 2020-10-03 MED ORDER — MORPHINE SULFATE (PF) 4 MG/ML IV SOLN
4.0000 mg | Freq: Once | INTRAVENOUS | Status: AC
  Administered 2020-10-03: 4 mg via INTRAVENOUS
  Filled 2020-10-03: qty 1

## 2020-10-03 MED ORDER — MORPHINE SULFATE (PF) 4 MG/ML IV SOLN
4.0000 mg | INTRAVENOUS | Status: DC | PRN
  Filled 2020-10-03: qty 1

## 2020-10-03 MED ORDER — LORAZEPAM 2 MG/ML IJ SOLN: 1.0000 mg | INTRAMUSCULAR | Status: DC | PRN

## 2020-10-03 MED ORDER — MORPHINE SULFATE (PF) 2 MG/ML IV SOLN
2.0000 mg | INTRAVENOUS | Status: DC | PRN
  Administered 2020-10-03 – 2020-10-04 (×7): 2 mg via INTRAVENOUS
  Filled 2020-10-03 (×8): qty 1

## 2020-10-03 MED ORDER — GADOBUTROL 1 MMOL/ML IV SOLN
10.0000 mL | Freq: Once | INTRAVENOUS | Status: AC | PRN
  Administered 2020-10-03: 10 mL via INTRAVENOUS

## 2020-10-03 NOTE — ED Provider Notes (Signed)
Care assumed from [facility], please see her note for full details, but in brief Raymond White is a 70 y.o. male with a history of Parkinson's, degenerative disc disease and multiple recent hospital visits due to back pain, numbness and weakness in his lower extremities.  After multiple evaluations and recent hospitalization patient has been admitted to inpatient rehab.  Returns today due to worsening pain, continued numbness and reported bowel and urinary incontinence.  Prior provider and Dr. Criss Alvine reviewed patient's neurosurgery notes and imaging extensively, and patient did not seem to have new deficits, they consulted neurology who felt that an MRI of the T-spine with and without could be done to evaluate for potential transverse myelitis but did not recommend additional MRIs of the cervical or lumbar spine.  Plan: Follow-up on MRI, if no acute changes can be discharged back to rehab facility  BP 126/75   Pulse 65   Temp 98 F (36.7 C) (Oral)   Resp 17   Ht 6\' 1"  (1.854 m)   Wt 99.8 kg   SpO2 95%   BMI 29.03 kg/m    ED Course/Procedures  MR THORACIC SPINE W WO CONTRAST  Result Date: 10/03/2020 CLINICAL DATA:  Fall EXAM: MRI THORACIC WITHOUT AND WITH CONTRAST TECHNIQUE: Multiplanar and multiecho pulse sequences of the thoracic spine were obtained without and with intravenous contrast. CONTRAST:  11mL GADAVIST GADOBUTROL 1 MMOL/ML IV SOLN COMPARISON:  None. FINDINGS: Alignment:  Straightening of normal thoracic kyphosis. Vertebrae: Edema within the bilateral posterior elements at T8 and T9, likely degenerative. No acute fracture. Fusion at T11-12. Cord:  Normal signal and morphology. Paraspinal and other soft tissues: Negative Disc levels: T1-2: Small disc bulge without stenosis. T2-3: Small disc bulge without stenosis. T11-12: Posterior vertebral body fusion. There is mild right foraminal stenosis. No spinal canal or neural foraminal stenosis elsewhere within the thoracic spine.  IMPRESSION: 1. No acute abnormality of the thoracic spine. 2. Mild right foraminal stenosis at T11-12. 3. Edema within the posterior elements at T8-9 may be a source of local back pain. Electronically Signed   By: 11 May M.D.   On: 10/03/2020 23:09     Procedures  MDM   MRI with no acute changes when compared to recent imaging.  Can be discharged back to [facility] for continued rehab.       [facility], PA-C 10/04/20 0103    10/06/20, MD 10/04/20 [phone]

## 2020-10-03 NOTE — Progress Notes (Signed)
Spoke with PA about this pt. Due to pts Covid status he will be done last.

## 2020-10-03 NOTE — ED Notes (Signed)
Placed external condom catheter on patient 

## 2020-10-03 NOTE — ED Provider Notes (Signed)
[facility]-EMERGENCY DEPT Provider Note   CSN: [id number] Arrival date & time: 10/03/20  1409     History Chief Complaint  Patient presents with  . Back Pain    Lower back pain that shoots down both legs but worse on the left leg    Raymond White is a 70 y.o. male with PMHx HTN, Parkinson's, DDD, with recent admission from 02/04-02/08 s/p fall down 12 stairs. Pt presented to the ED at that time with complaint of numbness from his belly button down to his BLEs, worse on right than left. Trauma scans obtained without significant findings. Pt continued to complain of severe thoracic spine pain as well as weakness to his BLEs and so MRIs were ordered of his C, T, and L spine without obvious findings. Dr. Jake Samples neurosurgery saw patient as well - no neurosurgical intervention recommended; PT eval and pain control recommended. Pt was admitted to the hospital for pain control as well as hypoxia s/2 COVID pneumonia. He was treated with IV morphine and discharged with oral oxycodone to [facility] for rehab. Pt reports that while in the hospital he continued to have the numbness as well as urinary/bowel incontinence. He states that he feels like the incontinence is getting worse since leaving the hospital. Pt reports that he can pull a pubic hair out without any sensation. He states that the provider at rehab has been performing rectal exams and states that he will be told to squeeze and the provider states he has no tone. Pt also complains of worsening pain. He states that anytime he tries to lift his legs up off the bed or move in anyway he will have excruciating pain. Pt states he is worried that he could have cauda equina syndrome - he reports he was told years ago that he had it however no surgical intervention was performed.   The history is provided by the patient, the EMS personnel and medical records.       Past Medical History:  Diagnosis Date  . Acute cystitis with  hematuria   . Acute midline low back pain with sciatica   . CHF (congestive heart failure) (HCC)   . Chronic heart failure with preserved ejection fraction (HCC)   . DDD (degenerative disc disease), lumbar   . Depressive disorder   . Drug induced constipation   . GERD (gastroesophageal reflux disease)   . History of TIA (transient ischemic attack)   . Hypertension   . Pancreatitis   . Parkinson's disease (HCC) 2015  . Stroke (HCC)   . Uncomplicated alcohol dependence (HCC)   . Urinary incontinence     Patient Active Problem List   Diagnosis Date Noted  . Fall at home, initial encounter 09/23/2020  . Acute respiratory failure with hypoxemia (HCC) 08/27/2020  . Acute on chronic diastolic CHF (congestive heart failure) (HCC) 08/27/2020  . Acute CHF (congestive heart failure) (HCC) 08/27/2020  . Acute congestive heart failure (HCC) 03/29/2020  . Acute right-sided weakness 03/28/2020  . Dyspnea on exertion 03/28/2020  . Respiratory failure with hypoxia (HCC) 10/22/2018  . Acute respiratory failure with hypoxia (HCC) 10/21/2018  . Influenza A 10/21/2018  . Parkinson disease (HCC) 10/21/2018  . DDD (degenerative disc disease), lumbar 12/15/2017  . Klebsiella cystitis 12/15/2017  . 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  . Chronic heart failure with preserved ejection fraction (HCC) 12/15/2017  . GERD (gastroesophageal reflux disease) 12/15/2017  . Hypertension 12/15/2017  . Depressive disorder 12/15/2017  . Anxiety 12/15/2017  Past Surgical History:  Procedure Laterality Date  . APPENDECTOMY    . BACK SURGERY     x5  . CHOLECYSTECTOMY    . COLONOSCOPY    . ESOPHAGOGASTRODUODENOSCOPY    . KNEE ARTHROSCOPY    . neck fusion         Family History  Problem Relation Age of Onset  . Hyperlipidemia Mother   . Colon cancer Father     Social History   Tobacco Use  . Smoking status: Never Smoker  . Smokeless tobacco: Never Used  Vaping Use  . Vaping Use: Never used   Substance Use Topics  . Alcohol use: Not Currently    Comment: Vodka  . Drug use: Never    Home Medications Prior to Admission medications   Medication Sig Start Date End Date Taking? Authorizing Provider  acetaminophen (TYLENOL) 500 MG tablet Take 500 mg by mouth every 6 (six) hours as needed for moderate pain.   Yes [provider]  albuterol (PROVENTIL HFA;VENTOLIN HFA) 108 (90 Base) MCG/ACT inhaler Inhale 2 puffs into the lungs every 6 (six) hours as needed for wheezing or shortness of breath. Wait 1 minute in between each puff   Yes [provider]  aspirin 81 MG chewable tablet Chew 81 mg by mouth daily.   Yes [provider]  bisacodyl (BISACODYL) 5 MG EC tablet Take 5 mg by mouth daily as needed for moderate constipation.   Yes [provider]  busPIRone (BUSPAR) 10 MG tablet Take 1 tablet (10 mg total) by mouth 3 (three) times daily. 08/31/20  Yes Lanae BoastKc, Ramesh, MD  carbidopa-levodopa (SINEMET IR) 25-100 MG tablet Take 2.5 tablets by mouth 3 (three) times daily.   Yes [provider]  cholecalciferol (VITAMIN D3) 25 MCG (1000 UNIT) tablet Take 1,000 Units by mouth daily.   Yes [provider]  clopidogrel (PLAVIX) 75 MG tablet Take 75 mg by mouth daily.   Yes [provider]  diphenoxylate-atropine (LOMOTIL) 2.5-0.025 MG tablet Take 1 tablet by mouth 3 (three) times daily as needed for diarrhea or loose stools. 08/31/20  Yes Lanae BoastKc, Ramesh, MD  DULoxetine (CYMBALTA) 30 MG capsule Take 30 mg by mouth 2 (two) times daily.   Yes [provider]  EPINEPHrine 0.3 mg/0.3 mL IJ SOAJ injection Inject 0.3 mg into the muscle daily as needed for anaphylaxis.   Yes [provider]  famotidine (PEPCID) 20 MG tablet Take 1 tablet (20 mg total) by mouth in the morning and at bedtime. 08/31/20  Yes Lanae BoastKc, Ramesh, MD  Fluticasone-Salmeterol (ADVAIR) 250-50 MCG/DOSE AEPB Inhale 1 puff into the lungs 2 (two) times daily.   Yes [provider]  furosemide (LASIX) 80 MG tablet Take 80 mg by mouth daily.   Yes [provider]  gabapentin (NEURONTIN) 300 MG capsule Take 300 mg by mouth 3 (three) times daily.   Yes [provider]  Lidocaine 4 % PTCH Apply 1 patch topically daily. Apply 1 patch to right thoracic back and 1 patch to right lower back and remove at bedtime   Yes [provider]  magnesium oxide (MAG-OX) 400 MG tablet Take 400 mg by mouth 2 (two) times daily.   Yes [provider]  Menthol, Topical Analgesic, (BIOFREEZE) 4 % GEL Apply 1 g topically 4 (four) times daily as needed (pain).   Yes [provider]  methocarbamol (ROBAXIN) 500 MG tablet Take 500 mg by mouth 2 (two) times daily.   Yes [provider]  omeprazole (PRILOSEC) 20 MG capsule Take 20 mg by mouth daily as needed (heartburn/reflux).   Yes [provider]  oxyCODONE (OXY IR/ROXICODONE) 5 MG immediate release tablet Take 1 tablet (5 mg total) by mouth every 6 (six) hours as needed for moderate pain. 09/27/20  Yes Ghimire, Werner Lean, MD  potassium chloride SA (K-DUR,KLOR-CON) 20 MEQ tablet Take 20 mEq by mouth daily. With food   Yes [provider]  rosuvastatin (CRESTOR) 40 MG tablet Take 40 mg by mouth at bedtime.   Yes [provider]  spironolactone (ALDACTONE) 25 MG tablet Take 1 tablet (25 mg total) by mouth daily. 09/27/20  Yes Ghimire, Werner Lean, MD  tamsulosin (FLOMAX) 0.4 MG CAPS capsule Take 0.4 mg by mouth at bedtime. Hold Dose For Systolic Pressure<100   Yes [provider]  thiamine (VITAMIN B-1) 100 MG tablet Take 1 tablet (100 mg total) by mouth daily. 08/31/20  Yes Lanae Boast, MD  tiZANidine (ZANAFLEX) 4 MG tablet Take 4 mg by mouth 2 (two) times daily.   Yes [provider]  vitamin B-12 (CYANOCOBALAMIN) 500 MCG tablet Take 1,000 mcg by mouth daily.   Yes [provider]  predniSONE (DELTASONE) 10 MG tablet Take 40 mg daily for 1  day, 30 mg daily for 1 day, 20 mg daily for 1 days,10 mg daily for 1 day, then stop Patient not taking: Reported on 10/03/2020 09/27/20   Maretta Bees, MD    Allergies    Fish-derived products, Zoloft [sertraline hcl], Sulfa antibiotics, and Sulfamethizole  Review of Systems   Review of Systems  Constitutional: Negative for chills and fever.  Gastrointestinal:       + bowel incontinence  Genitourinary:       + urinary incontinence  Musculoskeletal: Positive for back pain.  Neurological: Positive for numbness.  All other systems reviewed and are negative.   Physical Exam Updated Vital Signs BP 101/68 (BP Location: Left Arm)   Pulse 74   Temp 98 F (36.7 C) (Oral)   Resp 17   Ht  (1.854 m)   Wt 99.8 kg   SpO2 97%   BMI 29.03 kg/m   Physical Exam Vitals and nursing note reviewed.  Constitutional:      Appearance: He is not ill-appearing.  HENT:     Head: Normocephalic and atraumatic.  Eyes:     Conjunctiva/sclera: Conjunctivae normal.  Cardiovascular:     Rate and Rhythm: Normal rate and regular rhythm.     Pulses: Normal pulses.  Pulmonary:     Effort: Pulmonary effort is normal.     Breath sounds: Normal breath sounds. No wheezing, rhonchi or rales.  Abdominal:     Palpations: Abdomen is soft.     Tenderness: There is no abdominal tenderness. There is no guarding or rebound.  Genitourinary:    Comments: Rectal exam deferred as pt is in a hallway bed Musculoskeletal:     Cervical back: Neck supple.     Comments: No C midline spinal TTP. Birth mark noted to midline lumbar spine; no ecchymosis or erythema appreciated. + midline lumbar and lower thoracic spinal TTP with surrounding bilateral para musculature TTP. Strength significantly limited to BLEs; able to pick legs up off the bed a couple of centimeters before severe pain in lower back; pain worsened with hip flexion and extension passively. Subjective diminished sensation to BLEs (pt reports 80% decreased  sensation to RLE and 60% decreased sensation to LLE). Patellar reflexes  1/4 bilaterally. Pt with bandage to LLE - abrasions noted from fall on 2/04. 2+ DP pulses bilaterally.   Skin:    General: Skin is warm and dry.  Neurological:     Mental Status: He is alert.     ED Results / Procedures / Treatments   Labs (all labs ordered are listed, but only abnormal results are displayed) Labs Reviewed  COMPREHENSIVE METABOLIC PANEL - Abnormal; Notable for the following components:      Result Value   Calcium 8.3 (*)    Total Protein 6.1 (*)    Albumin 3.2 (*)    Total Bilirubin 1.4 (*)    All other components within normal limits  CBC WITH DIFFERENTIAL/PLATELET - Abnormal; Notable for the following components:   RBC 3.95 (*)    All other components within normal limits  URINALYSIS, ROUTINE W REFLEX MICROSCOPIC - Abnormal; Notable for the following components:   Leukocytes,Ua TRACE (*)    All other components within normal limits    EKG None  Radiology No results found.  Procedures Procedures   Medications Ordered in ED Medications  morphine 2 MG/ML injection 2 mg (has no administration in time range)  LORazepam (ATIVAN) injection 0.5 mg (has no administration in time range)  morphine 4 MG/ML injection 4 mg (4 mg Intravenous Given 10/03/20 1726)    ED Course  I have reviewed the triage vital signs and the nursing notes.  Pertinent labs & imaging results that were available during my care of the patient were reviewed by me and considered in my medical decision making (see chart for details).    MDM Rules/Calculators/A&P                          70 year old male who presents to the ED via EMS from [facility] secondary to worsening back pain with a complaint of numbness from his bellybutton down to his bilateral lower extremities with worsening urinary/bowel incontinence.  He was seen on 2/04 after falling down 12 steps, was having similar complaints at that time.  Trauma  scans negative, MRI C, T, L-spine without any acute abnormalities.  Patient admitted at that time for pain control and discharged on 02/08.  He was seen by neurosurgery at that time without surgical recommendations; it was suspected that pt's symptoms were secondary to pain. It does appear that while pt was in the hospital in January that he was also complaining of numbness to his belly button down; MRI at that time with some edema to proximal left ribs and the left posterior elements of T8 and T9 with concern for possible fracture or contusion. Pt had an MRI L spine on 1/07 after presenting for back pain with concern for cauda equina without obvious findings. Does seem to be a chronic issue for patient.   On arrival to the ED today vitals are stable.  Patient is afebrile, nontachycardic nontachypneic.  On exam patient is noted to have significant tenderness palpation to the thoracic and lumbar midline as well as bilateral paramusculature tenderness.  He has very limited strength in his bilateral lower extremities however this does seem mostly consistent with pain.  Whenever he tries to lift his legs up off the stretcher he begins to cry out in pain.  He is receiving 5 mg oxycodone every 6 hours at rehab as well as gabapentin however he states that this is not helping.  He does have diminished reflexes at this time however  appears consistent with reflexes noted by neurosurgery consult during his admission.  Difficult to say if patient is having symptoms consistent with cauda equina today versus this is mostly pain control.  We will plan for lab work at this time as well as IV pain medication with further evaluation.  May need to reconsider imaging spine at this time given patient's complaints of worsening symptoms however will plan to discuss with neurology if there is anything else that we may be missing at this time to account for pt's symptoms. Does not seem to be a neurosurgical issue at this time given  multiple negative MRIs in the past month. Attending physician Dr. Criss Alvine has evaluated patient as well and agrees with plan.   Discussed case with Neurology Dr. Napoleon Form - evaluated prior MRIs. Does appear that pt has some signal changes in thoracic spine; if any concern for worsening symptoms can get MRI T spine with and without contrast to further assess for any abnormalities including concern for possible transverse myelitis. Will order at this time; he does not recommend MRI of C or L spine today.  CBC without leukocytosis. Hgb stable at 13.2 CMP without electrolyte abnormalities.  U/A without infection   MRI delayed due to pt testing positive for COVID on 02/04. Per MRI radiologist they will get pt's MRI done last tonight prior to leaving.   At shift change case signed out to oncoming team Jodi Geralds, PA-C. If no acute findings of MRI then pt to be discharged back to rehab facility.   This note was prepared using Dragon voice recognition software and may include unintentional dictation errors due to the inherent limitations of voice recognition software.  Final Clinical Impression(s) / ED Diagnoses Final diagnoses:  Chronic midline back pain, unspecified back location    Rx / DC Orders ED Discharge Orders    None       Tanda Rockers, PA-C 10/03/20 2213    Pricilla Loveless, MD 10/04/20 [phone]

## 2020-10-04 MED ORDER — OXYCODONE HCL 5 MG PO TABS
5.0000 mg | ORAL_TABLET | Freq: Four times a day (QID) | ORAL | Status: DC | PRN
  Filled 2020-10-04: qty 1

## 2020-10-04 NOTE — ED Notes (Signed)
Called [facility] to check on transport back to [facility]. After hours number says that he is next in line for pickup

## 2020-10-04 NOTE — Discharge Instructions (Signed)
Continue rehab at [facility].  Follow-up outpatient with your neurosurgeon.

## 2020-10-04 NOTE — ED Notes (Signed)
Attempted to call report to [facility] x2 with no answer when transferred to nurse's station

## 2020-10-04 NOTE — ED Notes (Signed)
Attempted to call report to [facility] with no answer, Will call back later

## 2020-12-28 ENCOUNTER — Encounter (HOSPITAL_COMMUNITY): Payer: Self-pay

## 2020-12-28 ENCOUNTER — Inpatient Hospital Stay (HOSPITAL_COMMUNITY)
Admission: EM | Admit: 2020-12-28 | Discharge: 2021-01-02 | DRG: 291 | Disposition: A | Discharge status: Home / Self Care | Payer: Medicare Other | Attending: Internal Medicine | Admitting: Internal Medicine

## 2020-12-28 ENCOUNTER — Other Ambulatory Visit: Payer: Self-pay

## 2020-12-28 ENCOUNTER — Emergency Department (HOSPITAL_COMMUNITY): Payer: Medicare Other

## 2020-12-28 DIAGNOSIS — E538 Deficiency of other specified B group vitamins: Secondary | ICD-10-CM | POA: Diagnosis present

## 2020-12-28 DIAGNOSIS — G43109 Migraine with aura, not intractable, without status migrainosus: Secondary | ICD-10-CM | POA: Diagnosis present

## 2020-12-28 DIAGNOSIS — J9600 Acute respiratory failure, unspecified whether with hypoxia or hypercapnia: Secondary | ICD-10-CM | POA: Diagnosis present

## 2020-12-28 DIAGNOSIS — I110 Hypertensive heart disease with heart failure: Secondary | ICD-10-CM | POA: Diagnosis not present

## 2020-12-28 DIAGNOSIS — I5033 Acute on chronic diastolic (congestive) heart failure: Secondary | ICD-10-CM | POA: Diagnosis present

## 2020-12-28 DIAGNOSIS — Z7951 Long term (current) use of inhaled steroids: Secondary | ICD-10-CM

## 2020-12-28 DIAGNOSIS — Z7982 Long term (current) use of aspirin: Secondary | ICD-10-CM

## 2020-12-28 DIAGNOSIS — I10 Essential (primary) hypertension: Secondary | ICD-10-CM | POA: Diagnosis present

## 2020-12-28 DIAGNOSIS — J439 Emphysema, unspecified: Secondary | ICD-10-CM | POA: Diagnosis present

## 2020-12-28 DIAGNOSIS — Z981 Arthrodesis status: Secondary | ICD-10-CM

## 2020-12-28 DIAGNOSIS — R001 Bradycardia, unspecified: Secondary | ICD-10-CM

## 2020-12-28 DIAGNOSIS — I2510 Atherosclerotic heart disease of native coronary artery without angina pectoris: Secondary | ICD-10-CM | POA: Diagnosis present

## 2020-12-28 DIAGNOSIS — R0902 Hypoxemia: Secondary | ICD-10-CM

## 2020-12-28 DIAGNOSIS — Z9049 Acquired absence of other specified parts of digestive tract: Secondary | ICD-10-CM

## 2020-12-28 DIAGNOSIS — K219 Gastro-esophageal reflux disease without esophagitis: Secondary | ICD-10-CM | POA: Diagnosis present

## 2020-12-28 DIAGNOSIS — I493 Ventricular premature depolarization: Secondary | ICD-10-CM | POA: Diagnosis present

## 2020-12-28 DIAGNOSIS — F419 Anxiety disorder, unspecified: Secondary | ICD-10-CM | POA: Diagnosis present

## 2020-12-28 DIAGNOSIS — Z8616 Personal history of COVID-19: Secondary | ICD-10-CM

## 2020-12-28 DIAGNOSIS — D6959 Other secondary thrombocytopenia: Secondary | ICD-10-CM | POA: Diagnosis present

## 2020-12-28 DIAGNOSIS — I472 Ventricular tachycardia, unspecified: Secondary | ICD-10-CM

## 2020-12-28 DIAGNOSIS — Z8673 Personal history of transient ischemic attack (TIA), and cerebral infarction without residual deficits: Secondary | ICD-10-CM

## 2020-12-28 DIAGNOSIS — G20 Parkinson's disease: Secondary | ICD-10-CM | POA: Diagnosis present

## 2020-12-28 DIAGNOSIS — I509 Heart failure, unspecified: Secondary | ICD-10-CM

## 2020-12-28 DIAGNOSIS — F32A Depression, unspecified: Secondary | ICD-10-CM | POA: Diagnosis present

## 2020-12-28 DIAGNOSIS — E119 Type 2 diabetes mellitus without complications: Secondary | ICD-10-CM | POA: Diagnosis present

## 2020-12-28 DIAGNOSIS — J9601 Acute respiratory failure with hypoxia: Secondary | ICD-10-CM | POA: Diagnosis present

## 2020-12-28 DIAGNOSIS — Z79899 Other long term (current) drug therapy: Secondary | ICD-10-CM

## 2020-12-28 DIAGNOSIS — R29810 Facial weakness: Secondary | ICD-10-CM | POA: Diagnosis present

## 2020-12-28 DIAGNOSIS — I5031 Acute diastolic (congestive) heart failure: Secondary | ICD-10-CM

## 2020-12-28 DIAGNOSIS — E785 Hyperlipidemia, unspecified: Secondary | ICD-10-CM | POA: Diagnosis present

## 2020-12-28 DIAGNOSIS — Z955 Presence of coronary angioplasty implant and graft: Secondary | ICD-10-CM

## 2020-12-28 DIAGNOSIS — Z7902 Long term (current) use of antithrombotics/antiplatelets: Secondary | ICD-10-CM

## 2020-12-28 DIAGNOSIS — N400 Enlarged prostate without lower urinary tract symptoms: Secondary | ICD-10-CM | POA: Diagnosis present

## 2020-12-28 LAB — CBC WITH DIFFERENTIAL/PLATELET
Abs Immature Granulocytes: 0.02 10*3/uL (ref 0.00–0.07)
Basophils Absolute: 0 10*3/uL (ref 0.0–0.1)
Basophils Relative: 1 %
Eosinophils Absolute: 0.1 10*3/uL (ref 0.0–0.5)
Eosinophils Relative: 2 %
HCT: 39.5 % (ref 39.0–52.0)
Hemoglobin: 13 g/dL (ref 13.0–17.0)
Immature Granulocytes: 0 %
Lymphocytes Relative: 20 %
Lymphs Abs: 1.1 10*3/uL (ref 0.7–4.0)
MCH: 33.2 pg (ref 26.0–34.0)
MCHC: 32.9 g/dL (ref 30.0–36.0)
MCV: 101 fL — ABNORMAL HIGH (ref 80.0–100.0)
Monocytes Absolute: 0.6 10*3/uL (ref 0.1–1.0)
Monocytes Relative: 11 %
Neutro Abs: 3.6 10*3/uL (ref 1.7–7.7)
Neutrophils Relative %: 66 %
Platelets: 138 10*3/uL — ABNORMAL LOW (ref 150–400)
RBC: 3.91 MIL/uL — ABNORMAL LOW (ref 4.22–5.81)
RDW: 14.2 % (ref 11.5–15.5)
WBC: 5.4 10*3/uL (ref 4.0–10.5)
nRBC: 0 % (ref 0.0–0.2)

## 2020-12-28 LAB — COMPREHENSIVE METABOLIC PANEL
ALT: 9 U/L (ref 0–44)
AST: 15 U/L (ref 15–41)
Albumin: 3.1 g/dL — ABNORMAL LOW (ref 3.5–5.0)
Alkaline Phosphatase: 58 U/L (ref 38–126)
Anion gap: 6 (ref 5–15)
BUN: 12 mg/dL (ref 8–23)
CO2: 27 mmol/L (ref 22–32)
Calcium: 8.6 mg/dL — ABNORMAL LOW (ref 8.9–10.3)
Chloride: 104 mmol/L (ref 98–111)
Creatinine, Ser: 0.75 mg/dL (ref 0.61–1.24)
GFR, Estimated: >60 mL/min (ref >60)
Glucose, Bld: 102 mg/dL — ABNORMAL HIGH (ref 70–99)
Potassium: 3.9 mmol/L (ref 3.5–5.1)
Sodium: 137 mmol/L (ref 135–145)
Total Bilirubin: 0.8 mg/dL (ref 0.3–1.2)
Total Protein: 5.2 g/dL — ABNORMAL LOW (ref 6.5–8.1)

## 2020-12-28 LAB — RESP PANEL BY RT-PCR (FLU A&B, COVID) ARPGX2
Influenza A by PCR: NEGATIVE
Influenza B by PCR: NEGATIVE
SARS Coronavirus 2 by RT PCR: NEGATIVE

## 2020-12-28 LAB — BRAIN NATRIURETIC PEPTIDE: B Natriuretic Peptide: 198.3 pg/mL — ABNORMAL HIGH (ref 0.0–100.0)

## 2020-12-28 NOTE — ED Triage Notes (Signed)
Pt brought in by EMS for shortness of breath for the past 3 days. Pt reports he has gotten worse today especially upon exertion. Pt has a history of CHF. Pt also reports fluid retention in his lower extremities.

## 2020-12-28 NOTE — ED Provider Notes (Signed)
[facility] EMERGENCY DEPARTMENT Provider Note   CSN: [id number] Arrival date & time: 12/28/20  2012     History Chief Complaint  Patient presents with  . Shortness of Breath    Raymond White is a 70 y.o. male.  HPI    72 y.omalewith PMHx of chronic diastolic heart failure, chronic back pain, Parkinson's disease, DM-2, depression, stroke comes in with chief complaint of worsening shortness of breath.  Patient reports that over the last 3 to 5 days he has had increased shortness of breath.  The shortness of breath is typically exertional, and now walking in his house gets some short of breath.  He has also gained some weight, his usual dry weight is 205 to 210 -he suspects it is more.  He is having worsening pitting edema.  He called EMS because of his worsening shortness of breath, wheezing.  There is no associated cough, fevers, chills, body aches.  Past Medical History:  Diagnosis Date  . Acute cystitis with hematuria   . Acute midline low back pain with sciatica   . CHF (congestive heart failure) (HCC)   . Chronic heart failure with preserved ejection fraction (HCC)   . DDD (degenerative disc disease), lumbar   . Depressive disorder   . Drug induced constipation   . GERD (gastroesophageal reflux disease)   . History of TIA (transient ischemic attack)   . Hypertension   . Pancreatitis   . Parkinson's disease (HCC) 2015  . Stroke (HCC)   . Uncomplicated alcohol dependence (HCC)   . Urinary incontinence     Patient Active Problem List   Diagnosis Date Noted  . Fall at home, initial encounter 09/23/2020  . Acute respiratory failure with hypoxemia (HCC) 08/27/2020  . Acute on chronic diastolic CHF (congestive heart failure) (HCC) 08/27/2020  . Acute CHF (congestive heart failure) (HCC) 08/27/2020  . Acute congestive heart failure (HCC) 03/29/2020  . Acute right-sided weakness 03/28/2020  . Dyspnea on exertion 03/28/2020  . Respiratory failure  with hypoxia (HCC) 10/22/2018  . Acute respiratory failure with hypoxia (HCC) 10/21/2018  . Influenza A 10/21/2018  . Parkinson disease (HCC) 10/21/2018  . DDD (degenerative disc disease), lumbar 12/15/2017  . Klebsiella cystitis 12/15/2017  . 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  . Chronic heart failure with preserved ejection fraction (HCC) 12/15/2017  . GERD (gastroesophageal reflux disease) 12/15/2017  . Hypertension 12/15/2017  . Depressive disorder 12/15/2017  . Anxiety 12/15/2017    Past Surgical History:  Procedure Laterality Date  . APPENDECTOMY    . BACK SURGERY     x5  . CHOLECYSTECTOMY    . COLONOSCOPY    . ESOPHAGOGASTRODUODENOSCOPY    . KNEE ARTHROSCOPY    . neck fusion         Family History  Problem Relation Age of Onset  . Hyperlipidemia Mother   . Colon cancer Father     Social History   Tobacco Use  . Smoking status: Never Smoker  . Smokeless tobacco: Never Used  Vaping Use  . Vaping Use: Never used  Substance Use Topics  . Alcohol use: Not Currently    Comment: Vodka  . Drug use: Never    Home Medications Prior to Admission medications   Medication Sig Start Date End Date Taking? Authorizing Provider  acetaminophen (TYLENOL) 500 MG tablet Take 500 mg by mouth every 6 (six) hours as needed for moderate pain.    [provider]  albuterol (PROVENTIL HFA;VENTOLIN HFA) 108 (90 Base)  MCG/ACT inhaler Inhale 2 puffs into the lungs every 6 (six) hours as needed for wheezing or shortness of breath. Wait 1 minute in between each puff    [provider]  aspirin 81 MG chewable tablet Chew 81 mg by mouth daily.    [provider]  bisacodyl (BISACODYL) 5 MG EC tablet Take 5 mg by mouth daily as needed for moderate constipation.    [provider]  busPIRone (BUSPAR) 10 MG tablet Take 1 tablet (10 mg total) by mouth 3 (three) times daily. 08/31/20   Lanae Boast, MD  carbidopa-levodopa (SINEMET IR) 25-100 MG tablet Take 2.5  tablets by mouth 3 (three) times daily.    [provider]  cholecalciferol (VITAMIN D3) 25 MCG (1000 UNIT) tablet Take 1,000 Units by mouth daily.    [provider]  clopidogrel (PLAVIX) 75 MG tablet Take 75 mg by mouth daily.    [provider]  diphenoxylate-atropine (LOMOTIL) 2.5-0.025 MG tablet Take 1 tablet by mouth 3 (three) times daily as needed for diarrhea or loose stools. 08/31/20   Lanae Boast, MD  DULoxetine (CYMBALTA) 30 MG capsule Take 30 mg by mouth 2 (two) times daily.    [provider]  EPINEPHrine 0.3 mg/0.3 mL IJ SOAJ injection Inject 0.3 mg into the muscle daily as needed for anaphylaxis.    [provider]  famotidine (PEPCID) 20 MG tablet Take 1 tablet (20 mg total) by mouth in the morning and at bedtime. 08/31/20   Lanae Boast, MD  Fluticasone-Salmeterol (ADVAIR) 250-50 MCG/DOSE AEPB Inhale 1 puff into the lungs 2 (two) times daily.    [provider]  furosemide (LASIX) 80 MG tablet Take 80 mg by mouth daily.    [provider]  gabapentin (NEURONTIN) 300 MG capsule Take 300 mg by mouth 3 (three) times daily.    [provider]  Lidocaine 4 % PTCH Apply 1 patch topically daily. Apply 1 patch to right thoracic back and 1 patch to right lower back and remove at bedtime    [provider]  magnesium oxide (MAG-OX) 400 MG tablet Take 400 mg by mouth 2 (two) times daily.    [provider]  Menthol, Topical Analgesic, (BIOFREEZE) 4 % GEL Apply 1 g topically 4 (four) times daily as needed (pain).    [provider]  methocarbamol (ROBAXIN) 500 MG tablet Take 500 mg by mouth 2 (two) times daily.    [provider]  omeprazole (PRILOSEC) 20 MG capsule Take 20 mg by mouth daily as needed (heartburn/reflux).    [provider]  oxyCODONE (OXY IR/ROXICODONE) 5 MG immediate release tablet Take 1 tablet (5 mg total) by mouth every 6 (six) hours as needed for moderate pain.  09/27/20   Ghimire, Werner Lean, MD  potassium chloride SA (K-DUR,KLOR-CON) 20 MEQ tablet Take 20 mEq by mouth daily. With food    [provider]  predniSONE (DELTASONE) 10 MG tablet Take 40 mg daily for 1 day, 30 mg daily for 1 day, 20 mg daily for 1 days,10 mg daily for 1 day, then stop Patient not taking: Reported on 10/03/2020 09/27/20   Maretta Bees, MD  rosuvastatin (CRESTOR) 40 MG tablet Take 40 mg by mouth at bedtime.    [provider]  spironolactone (ALDACTONE) 25 MG tablet Take 1 tablet (25 mg total) by mouth daily. 09/27/20   Ghimire, Werner Lean, MD  tamsulosin (FLOMAX) 0.4 MG CAPS capsule Take 0.4 mg by mouth at  bedtime. Hold Dose For Systolic Pressure<100    [provider]  thiamine (VITAMIN B-1) 100 MG tablet Take 1 tablet (100 mg total) by mouth daily. 08/31/20   Lanae Boast, MD  tiZANidine (ZANAFLEX) 4 MG tablet Take 4 mg by mouth 2 (two) times daily.    [provider]  vitamin B-12 (CYANOCOBALAMIN) 500 MCG tablet Take 1,000 mcg by mouth daily.    [provider]    Allergies    Fish-derived products, Zoloft [sertraline hcl], Sulfa antibiotics, and Sulfamethizole  Review of Systems   Review of Systems  Constitutional: Positive for activity change.  Respiratory: Positive for shortness of breath.   Cardiovascular: Negative for chest pain.  Gastrointestinal: Negative for nausea and vomiting.  Allergic/Immunologic: Negative for immunocompromised state.  Hematological: Does not bruise/bleed easily.  All other systems reviewed and are negative.   Physical Exam Updated Vital Signs BP 112/71   Pulse (!) 57   Temp 97.8 F (36.6 C) (Oral)   Resp 18   Ht 6\' 1"  (1.854 m)   Wt 99.8 kg   SpO2 100%   BMI 29.03 kg/m   Physical Exam Vitals and nursing note reviewed.  Constitutional:      Appearance: He is well-developed.  HENT:     Head: Atraumatic.  Cardiovascular:     Rate and Rhythm: Normal rate.  Pulmonary:     Effort:  Pulmonary effort is normal.     Breath sounds: Examination of the right-lower field reveals rales. Examination of the left-lower field reveals rales. Wheezing and rales present. No decreased breath sounds.  Musculoskeletal:     Cervical back: Neck supple.     Right lower leg: Edema present.     Left lower leg: Edema present.  Skin:    General: Skin is warm.  Neurological:     Mental Status: He is alert and oriented to person, place, and time.     ED Results / Procedures / Treatments   Labs (all labs ordered are listed, but only abnormal results are displayed) Labs Reviewed  CBC WITH DIFFERENTIAL/PLATELET - Abnormal; Notable for the following components:      Result Value   RBC 3.91 (*)    MCV 101.0 (*)    Platelets 138 (*)    All other components within normal limits  RESP PANEL BY RT-PCR (FLU A&B, COVID) ARPGX2  COMPREHENSIVE METABOLIC PANEL  BRAIN NATRIURETIC PEPTIDE    EKG None  Radiology DG Chest Port 1 View  Result Date: 12/28/2020 CLINICAL DATA:  Shortness of breath EXAM: PORTABLE CHEST 1 VIEW COMPARISON:  09/24/2020 FINDINGS: Low lung volumes. The left lung is grossly clear. Possible small right effusion with right basilar atelectasis or mild pneumonia. Aortic atherosclerosis. No pneumothorax. IMPRESSION: 1. Cardiomegaly 2. Possible small right effusion with atelectasis or mild pneumonia right base Electronically Signed   By: 11/22/2020 M.D.   On: 12/28/2020 22:13    Procedures Procedures   Medications Ordered in ED Medications - No data to display  ED Course  I have reviewed the triage vital signs and the nursing notes.  Pertinent labs & imaging results that were available during my care of the patient were reviewed by me and considered in my medical decision making (see chart for details).    MDM Rules/Calculators/A&P                          70 year old male with history of CHF comes in with chief  complaint of worsening shortness of breath.  He reports  increased weight gain, increase exertional shortness of breath -symptoms being consistent with his prior CHF exacerbation.  He has no chest pain, infection-like symptoms.  Clinically it appears that patient is likely having symptoms of worsening diastolic heart failure, as the lung exam revealed minimal bibasilar rales and there is 1+ pitting edema in the lower extremity.  Considered PE in the differential, but it is much lower compared to diastolic heart failure exacerbation.  Will get labs, chest x-ray and reassess.  No hypoxia noted, but suspect he will need admission because of failed outpatient attempt at managing the CHF.  Final Clinical Impression(s) / ED Diagnoses Final diagnoses:  None    Rx / DC Orders ED Discharge Orders    None       Derwood KaplanNanavati, Eesa Justiss, MD 12/28/20 2235

## 2020-12-29 ENCOUNTER — Observation Stay (HOSPITAL_COMMUNITY): Payer: Medicare Other

## 2020-12-29 ENCOUNTER — Other Ambulatory Visit: Payer: Self-pay

## 2020-12-29 ENCOUNTER — Inpatient Hospital Stay (HOSPITAL_COMMUNITY): Payer: Medicare Other

## 2020-12-29 DIAGNOSIS — J9601 Acute respiratory failure with hypoxia: Secondary | ICD-10-CM

## 2020-12-29 DIAGNOSIS — I5033 Acute on chronic diastolic (congestive) heart failure: Secondary | ICD-10-CM

## 2020-12-29 DIAGNOSIS — E7800 Pure hypercholesterolemia, unspecified: Secondary | ICD-10-CM | POA: Diagnosis not present

## 2020-12-29 DIAGNOSIS — K219 Gastro-esophageal reflux disease without esophagitis: Secondary | ICD-10-CM | POA: Diagnosis present

## 2020-12-29 DIAGNOSIS — D6959 Other secondary thrombocytopenia: Secondary | ICD-10-CM | POA: Diagnosis present

## 2020-12-29 DIAGNOSIS — I2510 Atherosclerotic heart disease of native coronary artery without angina pectoris: Secondary | ICD-10-CM | POA: Diagnosis present

## 2020-12-29 DIAGNOSIS — G43109 Migraine with aura, not intractable, without status migrainosus: Secondary | ICD-10-CM | POA: Diagnosis present

## 2020-12-29 DIAGNOSIS — J9600 Acute respiratory failure, unspecified whether with hypoxia or hypercapnia: Secondary | ICD-10-CM | POA: Diagnosis present

## 2020-12-29 DIAGNOSIS — Z981 Arthrodesis status: Secondary | ICD-10-CM | POA: Diagnosis not present

## 2020-12-29 DIAGNOSIS — F419 Anxiety disorder, unspecified: Secondary | ICD-10-CM | POA: Diagnosis present

## 2020-12-29 DIAGNOSIS — N400 Enlarged prostate without lower urinary tract symptoms: Secondary | ICD-10-CM | POA: Diagnosis present

## 2020-12-29 DIAGNOSIS — E538 Deficiency of other specified B group vitamins: Secondary | ICD-10-CM | POA: Diagnosis present

## 2020-12-29 DIAGNOSIS — F32A Depression, unspecified: Secondary | ICD-10-CM | POA: Diagnosis present

## 2020-12-29 DIAGNOSIS — R001 Bradycardia, unspecified: Secondary | ICD-10-CM | POA: Diagnosis not present

## 2020-12-29 DIAGNOSIS — Z8673 Personal history of transient ischemic attack (TIA), and cerebral infarction without residual deficits: Secondary | ICD-10-CM | POA: Diagnosis not present

## 2020-12-29 DIAGNOSIS — Z7982 Long term (current) use of aspirin: Secondary | ICD-10-CM | POA: Diagnosis not present

## 2020-12-29 DIAGNOSIS — I10 Essential (primary) hypertension: Secondary | ICD-10-CM | POA: Diagnosis not present

## 2020-12-29 DIAGNOSIS — Z7902 Long term (current) use of antithrombotics/antiplatelets: Secondary | ICD-10-CM | POA: Diagnosis not present

## 2020-12-29 DIAGNOSIS — R531 Weakness: Secondary | ICD-10-CM | POA: Diagnosis not present

## 2020-12-29 DIAGNOSIS — E785 Hyperlipidemia, unspecified: Secondary | ICD-10-CM | POA: Diagnosis present

## 2020-12-29 DIAGNOSIS — Z955 Presence of coronary angioplasty implant and graft: Secondary | ICD-10-CM | POA: Diagnosis not present

## 2020-12-29 DIAGNOSIS — G20 Parkinson's disease: Secondary | ICD-10-CM | POA: Diagnosis present

## 2020-12-29 DIAGNOSIS — I493 Ventricular premature depolarization: Secondary | ICD-10-CM | POA: Diagnosis present

## 2020-12-29 DIAGNOSIS — Z9049 Acquired absence of other specified parts of digestive tract: Secondary | ICD-10-CM | POA: Diagnosis not present

## 2020-12-29 DIAGNOSIS — Z79899 Other long term (current) drug therapy: Secondary | ICD-10-CM | POA: Diagnosis not present

## 2020-12-29 DIAGNOSIS — I5031 Acute diastolic (congestive) heart failure: Secondary | ICD-10-CM | POA: Diagnosis not present

## 2020-12-29 DIAGNOSIS — I110 Hypertensive heart disease with heart failure: Secondary | ICD-10-CM | POA: Diagnosis present

## 2020-12-29 DIAGNOSIS — Z7951 Long term (current) use of inhaled steroids: Secondary | ICD-10-CM | POA: Diagnosis not present

## 2020-12-29 DIAGNOSIS — I472 Ventricular tachycardia: Secondary | ICD-10-CM | POA: Diagnosis not present

## 2020-12-29 DIAGNOSIS — I509 Heart failure, unspecified: Secondary | ICD-10-CM | POA: Diagnosis present

## 2020-12-29 DIAGNOSIS — Z8616 Personal history of COVID-19: Secondary | ICD-10-CM | POA: Diagnosis not present

## 2020-12-29 LAB — TSH: TSH: 1.813 u[IU]/mL (ref 0.350–4.500)

## 2020-12-29 LAB — PROCALCITONIN: Procalcitonin: <0.1 ng/mL

## 2020-12-29 LAB — D-DIMER, QUANTITATIVE: D-Dimer, Quant: 0.4 ug/mL-FEU (ref 0.00–0.50)

## 2020-12-29 MED ORDER — ACETAMINOPHEN 325 MG PO TABS: 650.0000 mg | ORAL_TABLET | Freq: Four times a day (QID) | ORAL | Status: DC | PRN

## 2020-12-29 MED ORDER — ROSUVASTATIN CALCIUM 20 MG PO TABS
40.0000 mg | ORAL_TABLET | Freq: Every day | ORAL | Status: DC
  Administered 2020-12-29 – 2021-01-01 (×4): 40 mg via ORAL
  Filled 2020-12-29 (×4): qty 2

## 2020-12-29 MED ORDER — THIAMINE HCL 100 MG PO TABS
100.0000 mg | ORAL_TABLET | Freq: Every day | ORAL | Status: DC
  Administered 2020-12-29 – 2021-01-02 (×5): 100 mg via ORAL
  Filled 2020-12-29 (×5): qty 1

## 2020-12-29 MED ORDER — GABAPENTIN 300 MG PO CAPS
300.0000 mg | ORAL_CAPSULE | Freq: Three times a day (TID) | ORAL | Status: DC
  Administered 2020-12-29 – 2021-01-02 (×14): 300 mg via ORAL
  Filled 2020-12-29 (×14): qty 1

## 2020-12-29 MED ORDER — ACETAMINOPHEN 650 MG RE SUPP: 650.0000 mg | Freq: Four times a day (QID) | RECTAL | Status: DC | PRN

## 2020-12-29 MED ORDER — ASPIRIN 81 MG PO CHEW
81.0000 mg | CHEWABLE_TABLET | Freq: Every day | ORAL | Status: DC
  Administered 2020-12-29 – 2021-01-02 (×5): 81 mg via ORAL
  Filled 2020-12-29 (×5): qty 1

## 2020-12-29 MED ORDER — CLOPIDOGREL BISULFATE 75 MG PO TABS
75.0000 mg | ORAL_TABLET | Freq: Every day | ORAL | Status: DC
  Administered 2020-12-29 – 2021-01-02 (×5): 75 mg via ORAL
  Filled 2020-12-29 (×5): qty 1

## 2020-12-29 MED ORDER — MOMETASONE FURO-FORMOTEROL FUM 200-5 MCG/ACT IN AERO
2.0000 | INHALATION_SPRAY | Freq: Two times a day (BID) | RESPIRATORY_TRACT | Status: DC
  Administered 2020-12-29: 2 via RESPIRATORY_TRACT
  Filled 2020-12-29: qty 8.8

## 2020-12-29 MED ORDER — PREGABALIN 75 MG PO CAPS
75.0000 mg | ORAL_CAPSULE | Freq: Two times a day (BID) | ORAL | Status: DC
  Administered 2020-12-29 – 2021-01-02 (×10): 75 mg via ORAL
  Filled 2020-12-29 (×4): qty 1
  Filled 2020-12-29 (×2): qty 3
  Filled 2020-12-29 (×4): qty 1

## 2020-12-29 MED ORDER — VITAMIN D 25 MCG (1000 UNIT) PO TABS
1000.0000 [IU] | ORAL_TABLET | Freq: Every day | ORAL | Status: DC
  Administered 2020-12-29 – 2021-01-02 (×5): 1000 [IU] via ORAL
  Filled 2020-12-29 (×5): qty 1

## 2020-12-29 MED ORDER — FUROSEMIDE 10 MG/ML IJ SOLN: 80.0000 mg | Freq: Two times a day (BID) | INTRAMUSCULAR | Status: DC

## 2020-12-29 MED ORDER — FUROSEMIDE 10 MG/ML IJ SOLN: 40.0000 mg | Freq: Two times a day (BID) | INTRAMUSCULAR | Status: DC

## 2020-12-29 MED ORDER — FUROSEMIDE 10 MG/ML IJ SOLN
80.0000 mg | Freq: Two times a day (BID) | INTRAMUSCULAR | Status: DC
  Administered 2020-12-29 – 2020-12-31 (×4): 80 mg via INTRAVENOUS
  Filled 2020-12-29 (×5): qty 8

## 2020-12-29 MED ORDER — FAMOTIDINE 20 MG PO TABS
20.0000 mg | ORAL_TABLET | Freq: Two times a day (BID) | ORAL | Status: DC
  Administered 2020-12-29 – 2021-01-02 (×9): 20 mg via ORAL
  Filled 2020-12-29 (×9): qty 1

## 2020-12-29 MED ORDER — VITAMIN B-12 1000 MCG PO TABS
1000.0000 ug | ORAL_TABLET | Freq: Every day | ORAL | Status: DC
  Administered 2020-12-29 – 2021-01-02 (×5): 1000 ug via ORAL
  Filled 2020-12-29 (×5): qty 1

## 2020-12-29 MED ORDER — ENOXAPARIN SODIUM 40 MG/0.4ML IJ SOSY
40.0000 mg | PREFILLED_SYRINGE | INTRAMUSCULAR | Status: DC
  Administered 2020-12-29 – 2021-01-02 (×5): 40 mg via SUBCUTANEOUS
  Filled 2020-12-29 (×5): qty 0.4

## 2020-12-29 MED ORDER — DULOXETINE HCL 30 MG PO CPEP
30.0000 mg | ORAL_CAPSULE | Freq: Two times a day (BID) | ORAL | Status: DC
  Administered 2020-12-29 – 2021-01-02 (×10): 30 mg via ORAL
  Filled 2020-12-29 (×12): qty 1

## 2020-12-29 MED ORDER — NORTRIPTYLINE HCL 10 MG PO CAPS
10.0000 mg | ORAL_CAPSULE | Freq: Every day | ORAL | Status: DC
  Administered 2020-12-29 – 2021-01-01 (×4): 10 mg via ORAL
  Filled 2020-12-29 (×5): qty 1

## 2020-12-29 MED ORDER — TAMSULOSIN HCL 0.4 MG PO CAPS
0.4000 mg | ORAL_CAPSULE | Freq: Every day | ORAL | Status: DC
  Administered 2020-12-29 – 2021-01-01 (×4): 0.4 mg via ORAL
  Filled 2020-12-29 (×4): qty 1

## 2020-12-29 MED ORDER — BUSPIRONE HCL 10 MG PO TABS
10.0000 mg | ORAL_TABLET | Freq: Three times a day (TID) | ORAL | Status: DC
  Administered 2020-12-29 – 2021-01-02 (×14): 10 mg via ORAL
  Filled 2020-12-29 (×14): qty 1

## 2020-12-29 MED ORDER — CARBIDOPA-LEVODOPA 25-100 MG PO TABS
2.5000 | ORAL_TABLET | Freq: Three times a day (TID) | ORAL | Status: DC
  Administered 2020-12-29 – 2021-01-02 (×14): 2.5 via ORAL
  Filled 2020-12-29 (×16): qty 2.5

## 2020-12-29 NOTE — ED Notes (Signed)
Pt ambulated on room air. O2 sats remained above 97% although pt reported feeling short of breath while ambulating.

## 2020-12-29 NOTE — H&P (Signed)
History and Physical    Raymond White XTA:[id number] DOB: 21-Mar-1951 DOA: 12/28/2020  [facility]P: [facility] Patient coming from: Home  Chief Complaint: Shortness of breath  HPI: Raymond White is a 70 y.o. male with medical history significant of chronic diastolic CHF, CAD, hypertension, hyperlipidemia, Parkinson's disease, CVA, chronic back pain, COVID-19 viral pneumonia in February 2022 presented to the ED via EMS for evaluation of dyspnea, bilateral lower extremity edema, and weight gain.  In the ED, oxygen saturation maintained above 97% with ambulation but patient complained of dyspnea and was placed on supplemental oxygen for comfort.  Bradycardic with heart rate in the 50s to 60s.  Not hypotensive.  Labs showing WBC 5.4, hemoglobin 13.0, platelet count 138K (slightly thrombocytopenic on previous labs as well).  Sodium 137, potassium 3.9, chloride 104, bicarb 27, BUN 12, creatinine 0.7, glucose 102.  BNP 198 (elevated compared to previous labs).  COVID and influenza PCR negative.  Chest x-ray showing cardiomegaly and possible small right effusion with atelectasis or mild right base pneumonia.  No medications administered.  Patient states for the past 2 to 3 days he has been feeling very short of breath.  He is not able to climb stairs at home and gets short of breath even just walking from his chair to the bathroom.  His legs have been more swollen lately despite him taking Lasix at home.  States previously he was on Lasix 40 mg twice daily but a month ago his physician at the [place] increased the dose to Lasix 80 mg twice daily.  States his dry weight is around 215 pounds but he checked his weight recently and it was around 238 pounds.  He does drink 6-8 500 cc bottles of fluid every day.  Denies chest pain.  Denies dizziness or history of syncopal episodes.  He is vaccinated against COVID -received a dose of [employer] vaccine sometime last year.  He had COVID infection 3 months  ago but no longer having any symptoms of an acute viral illness.  Review of Systems:  All systems reviewed and apart from history of presenting illness, are negative.  Past Medical History:  Diagnosis Date  . Acute cystitis with hematuria   . Acute midline low back pain with sciatica   . CHF (congestive heart failure) (HCC)   . Chronic heart failure with preserved ejection fraction (HCC)   . DDD (degenerative disc disease), lumbar   . Depressive disorder   . Drug induced constipation   . GERD (gastroesophageal reflux disease)   . History of TIA (transient ischemic attack)   . Hypertension   . Pancreatitis   . Parkinson's disease (HCC) 2015  . Stroke (HCC)   . Uncomplicated alcohol dependence (HCC)   . Urinary incontinence     Past Surgical History:  Procedure Laterality Date  . APPENDECTOMY    . BACK SURGERY     x5  . CHOLECYSTECTOMY    . COLONOSCOPY    . ESOPHAGOGASTRODUODENOSCOPY    . KNEE ARTHROSCOPY    . neck fusion       reports that he has never smoked. He has never used smokeless tobacco. He reports previous alcohol use. He reports that he does not use drugs.  Allergies  Allergen Reactions  . Fish-Derived Products Anaphylaxis  . Zoloft [Sertraline Hcl] Rash  . Sulfa Antibiotics Rash, Itching and Hives    Other reaction(s): Itching of eye, Eruption  . Sulfamethizole Rash    Family History  Problem  Relation Age of Onset  . Hyperlipidemia Mother   . Colon cancer Father     Prior to Admission medications   Medication Sig Start Date End Date Taking? Authorizing Provider  albuterol (PROVENTIL HFA;VENTOLIN HFA) 108 (90 Base) MCG/ACT inhaler Inhale 2 puffs into the lungs every 6 (six) hours as needed for wheezing or shortness of breath. Wait 1 minute in between each puff   Yes [provider]  aspirin 81 MG chewable tablet Chew 81 mg by mouth daily.   Yes [provider]  busPIRone (BUSPAR) 10 MG tablet Take 1 tablet (10 mg total) by mouth 3  (three) times daily. 08/31/20  Yes Lanae Boast, MD  carbidopa-levodopa (SINEMET IR) 25-100 MG tablet Take 2.5 tablets by mouth 3 (three) times daily.   Yes [provider]  cholecalciferol (VITAMIN D3) 25 MCG (1000 UNIT) tablet Take 1,000 Units by mouth daily.   Yes [provider]  clopidogrel (PLAVIX) 75 MG tablet Take 75 mg by mouth daily.   Yes [provider]  DULoxetine (CYMBALTA) 30 MG capsule Take 30 mg by mouth 2 (two) times daily.   Yes [provider]  EPINEPHrine 0.3 mg/0.3 mL IJ SOAJ injection Inject 0.3 mg into the muscle daily as needed for anaphylaxis.   Yes [provider]  famotidine (PEPCID) 20 MG tablet Take 1 tablet (20 mg total) by mouth in the morning and at bedtime. 08/31/20  Yes Lanae Boast, MD  Fluticasone-Salmeterol (ADVAIR) 250-50 MCG/DOSE AEPB Inhale 1 puff into the lungs 2 (two) times daily.   Yes [provider]  furosemide (LASIX) 80 MG tablet Take 80 mg by mouth daily.   Yes [provider]  gabapentin (NEURONTIN) 300 MG capsule Take 300 mg by mouth 3 (three) times daily.   Yes [provider]  magnesium oxide (MAG-OX) 400 MG tablet Take 400 mg by mouth 2 (two) times daily.   Yes [provider]  Menthol, Topical Analgesic, (BIOFREEZE) 4 % GEL Apply 1 g topically 4 (four) times daily as needed (pain).   Yes [provider]  nortriptyline (PAMELOR) 10 MG capsule Take 10 mg by mouth at bedtime. 10/10/20  Yes [provider]  omeprazole (PRILOSEC) 20 MG capsule Take 20 mg by mouth daily as needed (heartburn/reflux).   Yes [provider]  potassium chloride SA (K-DUR,KLOR-CON) 20 MEQ tablet Take 20 mEq by mouth daily. With food   Yes [provider]  pregabalin (LYRICA) 75 MG capsule Take 75 mg by mouth 2 (two) times daily. 10/10/20  Yes [provider]  rosuvastatin (CRESTOR) 40 MG tablet Take 40 mg by mouth at bedtime.   Yes [provider]   spironolactone (ALDACTONE) 25 MG tablet Take 1 tablet (25 mg total) by mouth daily. 09/27/20  Yes Ghimire, Werner Lean, MD  tamsulosin (FLOMAX) 0.4 MG CAPS capsule Take 0.4 mg by mouth at bedtime. Hold Dose For Systolic Pressure<100   Yes [provider]  thiamine (VITAMIN B-1) 100 MG tablet Take 1 tablet (100 mg total) by mouth daily. 08/31/20  Yes Lanae Boast, MD  vitamin B-12 (CYANOCOBALAMIN) 500 MCG tablet Take 1,000 mcg by mouth daily.   Yes [provider]  acetaminophen (TYLENOL) 500 MG tablet Take 500 mg by mouth every 6 (six) hours as needed for moderate pain. Patient not taking: No sig reported    [provider]  diphenoxylate-atropine (LOMOTIL) 2.5-0.025 MG tablet Take 1 tablet by mouth 3 (three) times daily as needed for diarrhea  or loose stools. Patient not taking: No sig reported 08/31/20   Lanae Boast, MD  oxyCODONE (OXY IR/ROXICODONE) 5 MG immediate release tablet Take 1 tablet (5 mg total) by mouth every 6 (six) hours as needed for moderate pain. Patient not taking: No sig reported 09/27/20   Maretta Bees, MD  predniSONE (DELTASONE) 10 MG tablet Take 40 mg daily for 1 day, 30 mg daily for 1 day, 20 mg daily for 1 days,10 mg daily for 1 day, then stop Patient not taking: No sig reported 09/27/20   Maretta Bees, MD    Physical Exam: Vitals:   12/28/20 2145 12/28/20 2300 12/29/20 0000 12/29/20 0152  BP: 112/71 111/71 112/72 131/88  Pulse: (!) 57 (!) 54 (!) 59 70  Resp: 18 19 (!) 23 (!) 24  Temp:    97.8 F (36.6 C)  TempSrc:    Oral  SpO2: 100% 100% 100% 100%  Weight:      Height:        Physical Exam Constitutional:      General: He is not in acute distress. HENT:     Head: Normocephalic and atraumatic.  Eyes:     Extraocular Movements: Extraocular movements intact.     Conjunctiva/sclera: Conjunctivae normal.  Neck:     Comments: Mild JVD Cardiovascular:     Rate and Rhythm: Normal rate and regular rhythm.     Pulses: Normal  pulses.  Pulmonary:     Effort: Pulmonary effort is normal. No respiratory distress.     Breath sounds: Rales present. No wheezing.     Comments: Bibasilar rales Abdominal:     General: Bowel sounds are normal.     Palpations: Abdomen is soft.     Tenderness: There is no abdominal tenderness. There is no guarding.  Musculoskeletal:     Cervical back: Normal range of motion and neck supple.     Right lower leg: Edema present.     Left lower leg: Edema present.     Comments: +1 pitting edema of bilateral lower extremities  Skin:    General: Skin is warm and dry.  Neurological:     General: No focal deficit present.     Mental Status: He is alert and oriented to person, place, and time.     Labs on Admission: I have personally reviewed following labs and imaging studies  CBC: Recent Labs  Lab 12/28/20 2159  WBC 5.4  NEUTROABS 3.6  HGB 13.0  HCT 39.5  MCV 101.0*  PLT 138*   Basic Metabolic Panel: Recent Labs  Lab 12/28/20 2159  NA 137  K 3.9  CL 104  CO2 27  GLUCOSE 102*  BUN 12  CREATININE 0.75  CALCIUM 8.6*   GFR: Estimated Creatinine Clearance: 106.8 mL/min (by C-G formula based on SCr of 0.75 mg/dL). Liver Function Tests: Recent Labs  Lab 12/28/20 2159  AST 15  ALT 9  ALKPHOS 58  BILITOT 0.8  PROT 5.2*  ALBUMIN 3.1*   No results for input(s): LIPASE, AMYLASE in the last 168 hours. No results for input(s): AMMONIA in the last 168 hours. Coagulation Profile: No results for input(s): INR, PROTIME in the last 168 hours. Cardiac Enzymes: No results for input(s): CKTOTAL, CKMB, CKMBINDEX, TROPONINI in the last 168 hours. BNP (last 3 results) No results for input(s): PROBNP in the last 8760 hours. HbA1C: No results for input(s): HGBA1C in the last 72 hours. CBG: No results for input(s): GLUCAP in the last 168 hours.  Lipid Profile: No results for input(s): CHOL, HDL, LDLCALC, TRIG, CHOLHDL, LDLDIRECT in the last 72 hours. Thyroid Function Tests: No  results for input(s): TSH, T4TOTAL, FREET4, T3FREE, THYROIDAB in the last 72 hours. Anemia Panel: No results for input(s): VITAMINB12, FOLATE, FERRITIN, TIBC, IRON, RETICCTPCT in the last 72 hours. Urine analysis:    Component Value Date/Time   COLORURINE YELLOW 10/03/2020 1514   APPEARANCEUR CLEAR 10/03/2020 1514   LABSPEC 1.006 10/03/2020 1514   PHURINE 6.0 10/03/2020 1514   GLUCOSEU NEGATIVE 10/03/2020 1514   HGBUR NEGATIVE 10/03/2020 1514   BILIRUBINUR NEGATIVE 10/03/2020 1514   KETONESUR NEGATIVE 10/03/2020 1514   PROTEINUR NEGATIVE 10/03/2020 1514   NITRITE NEGATIVE 10/03/2020 1514   LEUKOCYTESUR TRACE (A) 10/03/2020 1514    Radiological Exams on Admission: DG Chest Port 1 View  Result Date: 12/28/2020 CLINICAL DATA:  Shortness of breath EXAM: PORTABLE CHEST 1 VIEW COMPARISON:  09/24/2020 FINDINGS: Low lung volumes. The left lung is grossly clear. Possible small right effusion with right basilar atelectasis or mild pneumonia. Aortic atherosclerosis. No pneumothorax. IMPRESSION: 1. Cardiomegaly 2. Possible small right effusion with atelectasis or mild pneumonia right base Electronically Signed   By: Jasmine PangKim  Fujinaga M.D.   On: 12/28/2020 22:13    EKG: Independently reviewed.  Sinus rhythm with borderline PR prolongation.  Rate improved since prior tracing.  Assessment/Plan Principal Problem:   CHF exacerbation (HCC) Active Problems:   Hypertension   Parkinson disease (HCC)   Bradycardia   CAD (coronary artery disease)   Acute exacerbation of chronic diastolic CHF: Patient presenting with complaints of worsening dyspnea, bilateral lower extremity edema, and weight gain.  Appears volume overloaded on exam with bibasilar rales and pitting edema bilateral lower extremities.  BNP 198 (elevated compared to previous labs).  Chest x-ray showing cardiomegaly and possible small right effusion with atelectasis or mild right base pneumonia.  Given lack of fever or leukocytosis, pneumonia  felt to be less likely.  He had COVID-pneumonia 3 months ago.  Repeat COVID test and influenza PCR negative.  Suspect patient's symptoms are due to acute diastolic CHF exacerbation.  Echo done 08/29/2020 showing LVEF of 55 to 60% and grade 1 diastolic dysfunction.  He is taking Lasix 80 mg twice daily at home but does endorse increased fluid intake.  PE felt to be less likely given no tachycardia.  Patient maintained oxygen saturation above 97% with ambulation in the ED but complained of dyspnea and was placed on supplemental oxygen for comfort. -IV Lasix 80 mg twice daily.  Monitor intake and output, daily weights.  Low-sodium diet with fluid restriction.  Check D-dimer and procalcitonin level.  Sinus bradycardia: Bradycardic with heart rate as low as 50s in the ED, currently in the 60s.  Not symptomatic or hypotensive.  Not on any AV nodal blocking agents.  TSH was normal on 08/27/2020. -Cardiac monitoring, repeat TSH level  CAD: Stable.  Not endorsing anginal symptoms. -Not on beta-blocker due to history of bradycardia.  Continue antiplatelet agents and statin.  Hypertension: Stable.  Currently normotensive. -IV Lasix for acute decompensated CHF as mentioned above.  Hold home spironolactone at this time.  Hyperlipidemia -Continue Crestor  Parkinson's disease -Continue home meds  BPH -Continue Flomax  DVT prophylaxis: Lovenox Code Status: Full code-discussed with the patient. Family Communication: No family available at this time. Disposition Plan: Status is: Observation  The patient remains OBS appropriate and will d/c before 2 midnights.  Dispo: The patient is from: Home  Anticipated d/c is to: Home              Patient currently is not medically stable to d/c.   Difficult to place patient No   Level of care: Level of care: Telemetry Cardiac   The medical decision making on this patient was of high complexity and the patient is at high risk for clinical deterioration,  therefore this is a level 3 visit.  John Giovanni MD [facility]  If 7PM-7AM, please contact night-coverage [email or web address]  12/29/2020, 2:19 AM

## 2020-12-29 NOTE — ED Notes (Signed)
Attempted report x 1 with no success. Nurse not available. Will call back in 10 minutes.

## 2020-12-29 NOTE — ED Notes (Signed)
Provided medication as ordered. And undressed per pt request at this time

## 2020-12-29 NOTE — Progress Notes (Addendum)
[facility] Short Progress note  Patient examined, H and P and chart reviewed.   I have noted that his pulse ox is now 89% on room air where it was 97% last night. This is despite receiving Lasix.  Will repeat CXR today.  No new cough or change in dyspnea. Ankle edema has improved.  Principal Problem:   Acute hypoxic respiratory failure - received Lasix for acute on chronic d CHF-  - 2 D ECHO reveals grade 1 d CHF - PFTS 1/22 - FEV1/FVC ratio was 76 which is normal- no h/o smoking- dc Dulera - repeating CXR now-  - d dimer checked in the middle of the night was normal at 0.40-   Active Problems:     Parkinson disease  - cont Sinemet     CAD (coronary artery disease) -cont ASA, Plavix and Crestor  BPH - Flomax  Vit B 12 deficiency - cont supplement  Anxiety/depression - Buspar   GERD?  - cont Pepcid BID  Thrombocytopenia - appears chronic per labs   Calvert Cantor, MD

## 2020-12-29 NOTE — ED Notes (Addendum)
Trial ambulation attempted. O2 drops to 82% on RA. Dr.Rathore sent a secure chat.

## 2020-12-29 NOTE — Progress Notes (Signed)
SATURATION QUALIFICATIONS: (This note is used to comply with regulatory documentation for home oxygen)  Patient Saturations on Room Air at Rest = 95%  Patient Saturations on Room Air while Ambulating = 89%  Patient Saturations on 0 Liters of oxygen while Ambulating = n/a  Please briefly explain why patient needs home oxygen: patient does not qualify for home oxygen at this time

## 2020-12-30 ENCOUNTER — Inpatient Hospital Stay (HOSPITAL_COMMUNITY): Payer: Medicare Other

## 2020-12-30 DIAGNOSIS — I472 Ventricular tachycardia: Secondary | ICD-10-CM

## 2020-12-30 DIAGNOSIS — R29818 Other symptoms and signs involving the nervous system: Secondary | ICD-10-CM

## 2020-12-30 DIAGNOSIS — R531 Weakness: Secondary | ICD-10-CM

## 2020-12-30 DIAGNOSIS — G20 Parkinson's disease: Secondary | ICD-10-CM

## 2020-12-30 DIAGNOSIS — I10 Essential (primary) hypertension: Secondary | ICD-10-CM

## 2020-12-30 LAB — CBC
HCT: 45.9 % (ref 39.0–52.0)
Hemoglobin: 15.6 g/dL (ref 13.0–17.0)
MCH: 33.3 pg (ref 26.0–34.0)
MCHC: 34 g/dL (ref 30.0–36.0)
MCV: 97.9 fL (ref 80.0–100.0)
Platelets: 156 10*3/uL (ref 150–400)
RBC: 4.69 MIL/uL (ref 4.22–5.81)
RDW: 14.4 % (ref 11.5–15.5)
WBC: 7.8 10*3/uL (ref 4.0–10.5)
nRBC: 0 % (ref 0.0–0.2)

## 2020-12-30 LAB — BASIC METABOLIC PANEL
Anion gap: 9 (ref 5–15)
BUN: 13 mg/dL (ref 8–23)
CO2: 28 mmol/L (ref 22–32)
Calcium: 8.9 mg/dL (ref 8.9–10.3)
Chloride: 101 mmol/L (ref 98–111)
Creatinine, Ser: 0.85 mg/dL (ref 0.61–1.24)
GFR, Estimated: >60 mL/min (ref >60)
Glucose, Bld: 105 mg/dL — ABNORMAL HIGH (ref 70–99)
Potassium: 3.5 mmol/L (ref 3.5–5.1)
Sodium: 138 mmol/L (ref 135–145)

## 2020-12-30 LAB — MAGNESIUM: Magnesium: 1.9 mg/dL (ref 1.7–2.4)

## 2020-12-30 LAB — VITAMIN B12: Vitamin B-12: 200 pg/mL (ref 180–914)

## 2020-12-30 LAB — GLUCOSE, CAPILLARY: Glucose-Capillary: 123 mg/dL — ABNORMAL HIGH (ref 70–99)

## 2020-12-30 MED ORDER — NITROGLYCERIN 0.4 MG SL SUBL
SUBLINGUAL_TABLET | SUBLINGUAL | Status: AC
  Filled 2020-12-30: qty 1

## 2020-12-30 MED ORDER — IOHEXOL 350 MG/ML SOLN
100.0000 mL | Freq: Once | INTRAVENOUS | Status: AC | PRN
  Administered 2020-12-30: 100 mL via INTRAVENOUS

## 2020-12-30 MED ORDER — MAGNESIUM OXIDE -MG SUPPLEMENT 400 (240 MG) MG PO TABS
400.0000 mg | ORAL_TABLET | Freq: Two times a day (BID) | ORAL | Status: DC
  Administered 2020-12-30 – 2021-01-02 (×7): 400 mg via ORAL
  Filled 2020-12-30 (×7): qty 1

## 2020-12-30 MED ORDER — CYANOCOBALAMIN 1000 MCG/ML IJ SOLN
1000.0000 ug | Freq: Once | INTRAMUSCULAR | Status: AC
  Administered 2020-12-30: 1000 ug via SUBCUTANEOUS
  Filled 2020-12-30: qty 1

## 2020-12-30 MED ORDER — SODIUM CHLORIDE 0.9 % IV SOLN: INTRAVENOUS | Status: DC

## 2020-12-30 MED ORDER — METOPROLOL TARTRATE 5 MG/5ML IV SOLN
2.5000 mg | Freq: Once | INTRAVENOUS | Status: AC
  Administered 2020-12-30: 2.5 mg via INTRAVENOUS
  Filled 2020-12-30: qty 5

## 2020-12-30 MED ORDER — METOCLOPRAMIDE HCL 5 MG/ML IJ SOLN
10.0000 mg | Freq: Once | INTRAMUSCULAR | Status: AC
  Administered 2020-12-30: 10 mg via INTRAVENOUS
  Filled 2020-12-30: qty 2

## 2020-12-30 NOTE — Progress Notes (Signed)
PROGRESS NOTE    Raymond White   XMI:[id number]  DOB: 1951/05/13  DOA: 12/28/2020 PCP: [facility]   Brief Narrative:  Raymond White is a 70 y.o. male with medical history significant of chronic diastolic CHF, CAD, hypertension, hyperlipidemia, Parkinson's disease, CVA, chronic back pain, COVID-19 viral pneumonia in February 2022 presented to the ED via EMS for evaluation of dyspnea, bilateral lower extremity edema, and weight gain. He also describes an episode while standing at the sink where he felt suddenly fatigued and needed to sleep. This feeling was associated with some shortness of breath and resolved quickly.  Weight increased from 200 to about 238 lb lately.   In the ED >  Weight 224, initial ambulatory pulse ox 97% with dyspnea on exertion, repeat ambulatory pulse oxs x 2 separate walks the following AM 82 and then 89%.   5/12 night> 34 beat run of V tach- he states he had right sided pain, points to area below right rib case around the same time- not long after, developed left sided numbness, headache and visual changes. Code stroke called- suspected to be complicated Migraine and given IV Reglan. Symptoms resolved.  Subjective: Has no new complaints for me today    Assessment & Plan:   Principal Problem:   Acute respiratory failure with hypoxia - suspecting acute CHF and diuresing with IV Lasix- takes 80 mg oral Lasix at home daily- he cannot remember if he takes Spironolactone but it's on his med list - prior ECHO showed normal EF and grade 1 d CHF but may have greater diastolic dysfunction than seen - CT chest > Overall improvement in the peripheral ground-glass opacities from prior CT. Faint subpleural reticulation in the lingula and left lower lobe may represent post infectious or inflammatory scarring or potentially interstitial lung disease.  Active Problems: V tach overnight 5/12 - associated with right lower chest pain - IV Metoprolol  given- I have consulted cardiology today - K 3.5, Mg 1.9  Headache with left sided numbness and visual changes 5/12 - code stroke called- initial CT head negative - ? Complicated migraine- Reglan given- symptoms resolved - f/u MRI      Parkinson disease  - cont Sinemet     CAD (coronary artery disease) -cont ASA, Plavix and Crestor  BPH - Flomax  Vit B 12 deficiency - cont supplements- B12 level is 200- will give 1 s/c injection now  Anxiety/depression - Buspar, Cymbalta  GERD?  - cont Pepcid BID  Thrombocytopenia - appears chronic per labs    Time spent in minutes: 35 DVT prophylaxis: enoxaparin (LOVENOX) injection 40 mg Start: 12/29/20 1000  Code Status: full code Family Communication:  Level of Care: Level of care: Telemetry Cardiac Disposition Plan:  Status is: Inpatient  Remains inpatient appropriate because:Inpatient level of care appropriate due to severity of illness   Dispo: The patient is from: Home              Anticipated d/c is to: TBD              Patient currently is not medically stable to d/c.   Difficult to place patient No      Consultants:   Cardiology  Neurology  Procedures:    Antimicrobials:  Anti-infectives (From admission, onward)   None       Objective: Vitals:   12/30/20 0600 12/30/20 0645 12/30/20 0745 12/30/20 1150  BP: 107/73  120/72 110/70  Pulse: 83  85 78  Resp:  18  20 20   Temp: 98.1 F (36.7 C)  98.2 F (36.8 C) 98 F (36.7 C)  TempSrc: Oral  Oral Oral  SpO2: 93%  94% 91%  Weight:  102.1 kg    Height:        Intake/Output Summary (Last 24 hours) at 12/30/2020 1223 Last data filed at 12/30/2020 [id number] Gross per 24 hour  Intake 600 ml  Output 2100 ml  Net -1500 ml   Filed Weights   12/29/20 1149 12/29/20 1218 12/30/20 0645  Weight: 102 kg 103.1 kg 102.1 kg    Examination: General exam: Appears comfortable  HEENT: PERRLA, oral mucosa moist, no sclera icterus or thrush Respiratory system:  Clear to auscultation. Respiratory effort normal. Cardiovascular system: S1 & S2 heard, RRR.   Gastrointestinal system: Abdomen soft, non-tender, nondistended. Normal bowel sounds. Central nervous system: Alert and oriented. No focal neurological deficits. Extremities: No cyanosis, clubbing or edema Skin: No rashes or ulcers Psychiatry:  Mood & affect appropriate.     Data Reviewed: I have personally reviewed following labs and imaging studies  CBC: Recent Labs  Lab 12/28/20 2159 12/30/20 0246  WBC 5.4 7.8  NEUTROABS 3.6  --   HGB 13.0 15.6  HCT 39.5 45.9  MCV 101.0* 97.9  PLT 138* 156   Basic Metabolic Panel: Recent Labs  Lab 12/28/20 2159 12/30/20 0246  NA 137 138  K 3.9 3.5  CL 104 101  CO2 27 28  GLUCOSE 102* 105*  BUN 12 13  CREATININE 0.75 0.85  CALCIUM 8.6* 8.9  MG  --  1.9   GFR: Estimated Creatinine Clearance: 101.6 mL/min (by C-G formula based on SCr of 0.85 mg/dL). Liver Function Tests: Recent Labs  Lab 12/28/20 2159  AST 15  ALT 9  ALKPHOS 58  BILITOT 0.8  PROT 5.2*  ALBUMIN 3.1*   No results for input(s): LIPASE, AMYLASE in the last 168 hours. No results for input(s): AMMONIA in the last 168 hours. Coagulation Profile: No results for input(s): INR, PROTIME in the last 168 hours. Cardiac Enzymes: No results for input(s): CKTOTAL, CKMB, CKMBINDEX, TROPONINI in the last 168 hours. BNP (last 3 results) No results for input(s): PROBNP in the last 8760 hours. HbA1C: No results for input(s): HGBA1C in the last 72 hours. CBG: Recent Labs  Lab 12/30/20 0509  GLUCAP 123*   Lipid Profile: No results for input(s): CHOL, HDL, LDLCALC, TRIG, CHOLHDL, LDLDIRECT in the last 72 hours. Thyroid Function Tests: Recent Labs    12/29/20 0226  TSH 1.813   Anemia Panel: Recent Labs    12/30/20 0246  VITAMINB12 200   Urine analysis:    Component Value Date/Time   COLORURINE YELLOW 10/03/2020 1514   APPEARANCEUR CLEAR 10/03/2020 1514   LABSPEC  1.006 10/03/2020 1514   PHURINE 6.0 10/03/2020 1514   GLUCOSEU NEGATIVE 10/03/2020 1514   HGBUR NEGATIVE 10/03/2020 1514   BILIRUBINUR NEGATIVE 10/03/2020 1514   KETONESUR NEGATIVE 10/03/2020 1514   PROTEINUR NEGATIVE 10/03/2020 1514   NITRITE NEGATIVE 10/03/2020 1514   LEUKOCYTESUR TRACE (A) 10/03/2020 1514   Sepsis Labs: @LABRCNTIP (procalcitonin:4,lacticidven:4) ) Recent Results (from the past 240 hour(s))  Resp Panel by RT-PCR (Flu A&B, Covid) Nasopharyngeal Swab     Status: None   Collection Time: 12/28/20 10:00 PM   Specimen: Nasopharyngeal Swab; Nasopharyngeal(NP) swabs in vial transport medium  Result Value Ref Range Status   SARS Coronavirus 2 by RT PCR NEGATIVE NEGATIVE Final    Comment: (NOTE) SARS-CoV-2 target nucleic acids are  NOT DETECTED.  The SARS-CoV-2 RNA is generally detectable in upper respiratory specimens during the acute phase of infection. The lowest concentration of SARS-CoV-2 viral copies this assay can detect is 138 copies/mL. A negative result does not preclude SARS-Cov-2 infection and should not be used as the sole basis for treatment or other patient management decisions. A negative result may occur with  improper specimen collection/handling, submission of specimen other than nasopharyngeal swab, presence of viral mutation(s) within the areas targeted by this assay, and inadequate number of viral copies(<138 copies/mL). A negative result must be combined with clinical observations, patient history, and epidemiological information. The expected result is Negative.  Fact Sheet for Patients:  BloggerCourse.com  Fact Sheet for Healthcare Providers:  SeriousBroker.it  This test is no t yet approved or cleared by the [place] FDA and  has been authorized for detection and/or diagnosis of SARS-CoV-2 by FDA under an Emergency Use Authorization (EUA). This EUA will remain  in effect (meaning this  test can be used) for the duration of the COVID-19 declaration under Section 564(b)(1) of the Act, 21 U.S.C.section 360bbb-3(b)(1), unless the authorization is terminated  or revoked sooner.       Influenza A by PCR NEGATIVE NEGATIVE Final   Influenza B by PCR NEGATIVE NEGATIVE Final    Comment: (NOTE) The Xpert Xpress SARS-CoV-2/FLU/RSV plus assay is intended as an aid in the diagnosis of influenza from Nasopharyngeal swab specimens and should not be used as a sole basis for treatment. Nasal washings and aspirates are unacceptable for Xpert Xpress SARS-CoV-2/FLU/RSV testing.  Fact Sheet for Patients: BloggerCourse.com  Fact Sheet for Healthcare Providers: SeriousBroker.it  This test is not yet approved or cleared by the [place] FDA and has been authorized for detection and/or diagnosis of SARS-CoV-2 by FDA under an Emergency Use Authorization (EUA). This EUA will remain in effect (meaning this test can be used) for the duration of the COVID-19 declaration under Section 564(b)(1) of the Act, 21 U.S.C. section 360bbb-3(b)(1), unless the authorization is terminated or revoked.  Performed at [facility], [address] [place], [place] [address]          Radiology Studies: DG Chest 2 View  Result Date: 12/29/2020 CLINICAL DATA:  Habits shortness of breath and RIGHT-sided chest pain. History of pulmonary and diaphragmatic surgery in the past. EXAM: CHEST - 2 VIEW COMPARISON:  Dec 28, 2020 and September 23, 2020. FINDINGS: EKG leads project over the chest. Trachea is midline. Cardiomediastinal contours and hilar structures are stable. Lung volumes remain diminished. Colonic interposition beneath the RIGHT hemidiaphragm is noted as on previous imaging. No consolidation or sign of pleural effusion. Basilar scarring over the RIGHT lung base is similar to the prior exam. On limited assessment no acute skeletal process.  Signs of prior thoracotomy in the RIGHT chest. IMPRESSION: Stable appearance of the chest.  No acute cardiopulmonary disease. Electronically Signed   By: Donzetta Kohut M.D.   On: 12/29/2020 14:27   CT CHEST WO CONTRAST  Result Date: 12/29/2020 CLINICAL DATA:  Respiratory failure. EXAM: CT CHEST WITHOUT CONTRAST TECHNIQUE: Multidetector CT imaging of the chest was performed following the standard protocol without IV contrast. COMPARISON:  Recent radiographs.  Most recent chest CT 09/23/2020 FINDINGS: Cardiovascular: Normal caliber thoracic aorta with mild atherosclerosis. Normal heart size with coronary artery calcifications. No pericardial effusion. Mediastinum/Nodes: Scattered small mediastinal lymph nodes not enlarged by size criteria. Limited assessment for hilar adenopathy on this noncontrast exam. Small hiatal hernia with postsurgical  change of the gastroesophageal junction. No visualized thyroid nodule. Lungs/Pleura: Unchanged elevation of the right hemidiaphragm with right basilar pleural thickening calcifications, query prior talc pleurodesis. Overall no significant change from prior exam. Similar right basilar scarring. Overall improvement in the peripheral ground-glass opacities on prior CT. Faint subpleural reticulation noted in the lingula and left lower lobe. Minimal subsegmental atelectasis or scarring in the anterior right upper lobe. No dominant pulmonary nodule or suspicious mass. Trachea and central bronchi are patent. No significant pleural effusion. Upper Abdomen: No acute findings. Cholecystectomy. Colonic interposition under the right hemidiaphragm. Musculoskeletal: Straightening of normal thoracic kyphosis. Multilevel degenerative change. Remote postsurgical or posttraumatic change of right posterior ribs. No acute osseous findings. IMPRESSION: 1. Overall improvement in the peripheral ground-glass opacities from prior CT. Faint subpleural reticulation in the lingula and left lower lobe  may represent post infectious or inflammatory scarring or potentially interstitial lung disease. 2. Unchanged elevation of the right hemidiaphragm with right basilar scarring, pleural thickening and calcifications, query prior talc pleurodesis. 3. Coronary artery calcifications.  Aortic atherosclerosis. Aortic Atherosclerosis (ICD10-I70.0). Electronically Signed   By: Narda Rutherford M.D.   On: 12/29/2020 21:40   DG Chest Port 1 View  Result Date: 12/28/2020 CLINICAL DATA:  Shortness of breath EXAM: PORTABLE CHEST 1 VIEW COMPARISON:  09/24/2020 FINDINGS: Low lung volumes. The left lung is grossly clear. Possible small right effusion with right basilar atelectasis or mild pneumonia. Aortic atherosclerosis. No pneumothorax. IMPRESSION: 1. Cardiomegaly 2. Possible small right effusion with atelectasis or mild pneumonia right base Electronically Signed   By: Jasmine Pang M.D.   On: 12/28/2020 22:13   CT HEAD CODE STROKE WO CONTRAST  Result Date: 12/30/2020 CLINICAL DATA:  Code stroke. Left-sided weakness with aphasia and neglect. EXAM: CT HEAD WITHOUT CONTRAST TECHNIQUE: Contiguous axial images were obtained from the base of the skull through the vertex without intravenous contrast. COMPARISON:  09/23/2020 FINDINGS: Brain: No evidence of acute infarction, hemorrhage, hydrocephalus, extra-axial collection or mass lesion/mass effect. Cerebral volume loss without specific pattern. Vascular: No hyperdense vessel or unexpected calcification. Skull: Normal. Negative for fracture or focal lesion. Sinuses/Orbits: No acute finding. Other: These results were called by telephone at the time of interpretation on 12/30/2020 at 5:37 am to provider [facility] , who verbally acknowledged these results. ASPECTS [facility] Stroke Program Early CT Score) - Ganglionic level infarction (caudate, lentiform nuclei, internal capsule, insula, M1-M3 cortex): 7 - Supraganglionic infarction (M4-M6 cortex): 3 Total score (0-10 with  10 being normal): 10 IMPRESSION: No acute finding.  Aspects is 10. Electronically Signed   By: Marnee Spring M.D.   On: 12/30/2020 05:38   CT ANGIO HEAD NECK W WO CM W PERF (CODE STROKE)  Addendum Date: 12/30/2020   ADDENDUM REPORT: 12/30/2020 06:10 ADDENDUM: CT perfusion was performed using rapid software after bolus administration of iodinated contrast. No penumbra or infarct detected. Electronically Signed   By: Marnee Spring M.D.   On: 12/30/2020 06:10   Result Date: 12/30/2020 CLINICAL DATA:  Stroke follow-up EXAM: CT ANGIOGRAPHY HEAD AND NECK TECHNIQUE: Multidetector CT imaging of the head and neck was performed using the standard protocol during bolus administration of intravenous contrast. Multiplanar CT image reconstructions and MIPs were obtained to evaluate the vascular anatomy. Carotid stenosis measurements (when applicable) are obtained utilizing NASCET criteria, using the distal internal carotid diameter as the denominator. CONTRAST:  Dose is currently not known on this in progress study COMPARISON:  Head CT from earlier the same day FINDINGS: CTA NECK FINDINGS  Aortic arch: Limited coverage which is unremarkable. Right carotid system: No evidence of dissection, stenosis (50% or greater) or occlusion. Minimal bifurcation atheromatous changes Left carotid system: No evidence of dissection, stenosis (50% or greater) or occlusion. Mild bifurcation atherosclerosis. Vertebral arteries: No proximal subclavian stenosis. Left dominant vertebral artery. Both vertebral arteries are smoothly contoured and widely patent to the dura. Skeleton: Generalized degenerative disease of the cervical spine. There is C4-C7 ACDF with solid arthrodesis. C2-3 facet ankylosis. Other neck: No acute finding Upper chest: Negative Review of the MIP images confirms the above findings CTA HEAD FINDINGS Anterior circulation: Notable lack of atheromatous changes for age. No branch occlusion, stenosis, aneurysm, or vascular  malformation. Posterior circulation: Strong left vertebral artery dominance. The right vertebral artery essentially ends in the PICA. Diffusely patent basilar. Small basilar in the setting of fetal type PCA flow on the right more than left. Venous sinuses: Negative Anatomic variants: None significant Review of the MIP images confirms the above findings IMPRESSION: No emergent finding. Mild atherosclerosis without stenosis. Electronically Signed: By: Marnee Spring M.D. On: 12/30/2020 05:58      Scheduled Meds: . aspirin  81 mg Oral Daily  . busPIRone  10 mg Oral TID  . carbidopa-levodopa  2.5 tablet Oral TID  . cholecalciferol  1,000 Units Oral Daily  . clopidogrel  75 mg Oral Daily  . DULoxetine  30 mg Oral BID  . enoxaparin (LOVENOX) injection  40 mg Subcutaneous Q24H  . famotidine  20 mg Oral BID  . furosemide  80 mg Intravenous BID  . gabapentin  300 mg Oral TID  . nitroGLYCERIN      . nitroGLYCERIN      . nortriptyline  10 mg Oral QHS  . pregabalin  75 mg Oral BID  . rosuvastatin  40 mg Oral QHS  . tamsulosin  0.4 mg Oral QHS  . thiamine  100 mg Oral Daily  . vitamin B-12  1,000 mcg Oral Daily   Continuous Infusions:   LOS: 1 day      Calvert Cantor, MD [facility] Pager: [email or web address] 12/30/2020, 12:23 PM

## 2020-12-30 NOTE — Consult Note (Signed)
Neurology Consultation Reason for Consult: Left sided Weakness Referring Physician: Monica Becton  CC: Stroke  History is obtained from:patient, chart  HPI: Raymond White is a 70 y.o. male admitted for shortness of breath. He ad a run of [place] which prompted the nurses to assess him. He complained of "difficulty to focus" and then wqith further assessment. It was noted that he had left sided weakness and a code stroke was called.   He states that he has a fairly significant bifrontal headache.  He also complains of photophobia, but denies nausea or vomiting.  He denies a history of migraines.  He did have one previous episode in August 2021 that was diagnosed as TIA consisting of right-sided weakness.  Initial consult note did mention some concern for conversion disorder.  He also endorses some "blurry vision" like he has difficulty focusing on objects.  LKW: midnight tpa given?: no, out of window Thrombectomy?: no LVO    ROS: A 14 point ROS was performed and is negative except as noted in the HPI.   Past Medical History:  Diagnosis Date  . Acute cystitis with hematuria   . Acute midline low back pain with sciatica   . CHF (congestive heart failure) (HCC)   . Chronic heart failure with preserved ejection fraction (HCC)   . DDD (degenerative disc disease), lumbar   . Depressive disorder   . Drug induced constipation   . GERD (gastroesophageal reflux disease)   . History of TIA (transient ischemic attack)   . Hypertension   . Pancreatitis   . Parkinson's disease (HCC) 2015  . Stroke (HCC)   . Uncomplicated alcohol dependence (HCC)   . Urinary incontinence      Family History  Problem Relation Age of Onset  . Hyperlipidemia Mother   . Colon cancer Father      Social History:  reports that he has never smoked. He has never used smokeless tobacco. He reports previous alcohol use. He reports that he does not use drugs.   Exam: Current vital signs: BP 129/77 (BP  Location: Right Arm)   Pulse 87   Temp 97.8 F (36.6 C) (Oral)   Resp 18   Ht 6\' 1"  (1.854 m)   Wt 103.1 kg   SpO2 93%   BMI 29.98 kg/m  Vital signs in last 24 hours: Temp:  [97.8 F (36.6 C)-98.5 F (36.9 C)] 97.8 F (36.6 C) (05/13 0046) Pulse Rate:  [62-89] 87 (05/13 0046) Resp:  [16-27] 18 (05/13 0046) BP: (118-138)/(65-93) 129/77 (05/13 0046) SpO2:  [89 %-99 %] 93 % (05/13 0046) Weight:  [102 kg-103.1 kg] 103.1 kg (05/12 1218)   Physical Exam  Constitutional: Appears well-developed and well-nourished.  Psych: Affect appropriate to situation Eyes: No scleral injection HENT: No OP obstruction MSK: no joint deformities.  Cardiovascular: Normal rate and regular rhythm.  Respiratory: Effort normal, non-labored breathing GI: Soft.  No distension. There is no tenderness.  Skin: WDI  Neuro: Mental Status: Patient is awake, alert, oriented to person, place, month, year, and situation. Patient is able to give a clear and coherent history. No signs of aphasia or neglect Cranial Nerves: II: He has inconsistent difficulty with counting fingers,?  Left upper quadrantanopia but this is not definite.. Pupils are equal, round, and reactive to light.   III,IV, VI: EOMI without ptosis or diploplia.  V: Facial sensation is diminished on the left, he splits midline to light touch VII: Facial movement with mild left facial weakness VIII: hearing is  intact to voice X: Uvula elevates symmetrically XI: Shoulder shrug is symmetric. XII: tongue is midline without atrophy or fasciculations.  Motor: He has 4/5 weakness of the left arm and leg.  He has downward drift without pronation. Sensory: He feels decreased sensation throughout the left side "like someone gave me Novocain" Cerebellar: Does not perform   I have reviewed labs in epic and the results pertinent to this consultation are: Cr 0.85 TSH 1.8  I have reviewed the images obtained:CT/CTA/CTP-no acute  findings  Impression: 70 year old male with unilateral weakness in the setting of headache with photophobia.  Possibilities include migraine, conversion disorder.  Less likely, TIA or stroke.  Recommendations: 1) Reglan 10 mg IV x1 2) MRI brain 3) stroke work-up only if MRI is positive.   Ritta Slot, MD [facility] [phone]  If 7pm- 7am, please page neurology on call as listed in AMION.

## 2020-12-30 NOTE — Significant Event (Signed)
Rapid Response Event Note   Reason for Call :  NSVT, difficulty focusing   Initial Focused Assessment:  Pt lying in bed in no distress. Pt is alert and oriented, however c/o mild SOB and difficulty focusing/getting his words out. Pt denies CP. Lungs CTA. Skin warm and diaphoretic. Pt LSN-0000. NIH-5 d/t L sided weakness, sensory deficit and aphasia.   HR-83, BP-107/73, RR-18, SpO2-93% on 2LNC   Interventions:  EKG-NSR  CBG-123 Code Stroke initiated at 0511(paged out at 0517): CT head-negative for acute changes CTA/CTP-no LVO Stroke swallow screen to be done by bedside RN prior to eating/drinking MRI when able   Plan of Care:  Pt out of window for TPA and does not have LVO/not an IR candidate. Stroke swallow screen to be done prior to eating/drinking. MRI when able.   Pt's VSS and no more ectopy has been noticed. K-3.5 and Mg-1.9 this AM. Continue to monitor pt closely. Call RRT if further assistance needed.    Event Summary:   MD Notified: Dr. Kirkpatrick notified and met pt in CT. Dr. Zerle-Ghosh notified once back in pt's room Call Time:0454 Arrival Time:0500 End Time:0628  ,  Anderson, RN 

## 2020-12-30 NOTE — Progress Notes (Signed)
   12/30/20 0900  Mobility  Activity Contraindicated/medical hold

## 2020-12-31 ENCOUNTER — Inpatient Hospital Stay (HOSPITAL_COMMUNITY): Payer: Medicare Other

## 2020-12-31 DIAGNOSIS — I472 Ventricular tachycardia: Secondary | ICD-10-CM

## 2020-12-31 DIAGNOSIS — G43409 Hemiplegic migraine, not intractable, without status migrainosus: Secondary | ICD-10-CM

## 2020-12-31 DIAGNOSIS — E7800 Pure hypercholesterolemia, unspecified: Secondary | ICD-10-CM

## 2020-12-31 LAB — ECHOCARDIOGRAM COMPLETE
Area-P 1/2: 2.99 cm2
Height: 73 in
P 1/2 time: 536 msec
S' Lateral: 2.5 cm
Weight: 3569.6 oz

## 2020-12-31 LAB — BASIC METABOLIC PANEL
Anion gap: 9 (ref 5–15)
BUN: 13 mg/dL (ref 8–23)
CO2: 30 mmol/L (ref 22–32)
Calcium: 8.8 mg/dL — ABNORMAL LOW (ref 8.9–10.3)
Chloride: 99 mmol/L (ref 98–111)
Creatinine, Ser: 0.96 mg/dL (ref 0.61–1.24)
GFR, Estimated: >60 mL/min (ref >60)
Glucose, Bld: 123 mg/dL — ABNORMAL HIGH (ref 70–99)
Potassium: 3.6 mmol/L (ref 3.5–5.1)
Sodium: 138 mmol/L (ref 135–145)

## 2020-12-31 MED ORDER — POTASSIUM CHLORIDE CRYS ER 20 MEQ PO TBCR
40.0000 meq | EXTENDED_RELEASE_TABLET | Freq: Once | ORAL | Status: AC
  Administered 2020-12-31: 40 meq via ORAL
  Filled 2020-12-31: qty 2

## 2020-12-31 MED ORDER — METOPROLOL TARTRATE 25 MG PO TABS
25.0000 mg | ORAL_TABLET | Freq: Two times a day (BID) | ORAL | Status: DC
  Administered 2020-12-31 – 2021-01-02 (×5): 25 mg via ORAL
  Filled 2020-12-31 (×5): qty 1

## 2020-12-31 NOTE — Progress Notes (Signed)
Brief Neuro Update:  Reviewed MRI brain without contrast and negative for an strokes. No hemorrhage. The event was likely a complex migraine that has sine resolved.  No further inpatient neurologic workup recommended at this time. Neurology inpatient team will signoff.  Erick Blinks [facility] Pager Number [phone]

## 2020-12-31 NOTE — Consult Note (Signed)
Cardiology Consultation:   Patient ID: [name] MRN: [id number]; DOB: Mar 14, 1951  Admit date: 12/28/2020 Date of Consult: 12/31/2020  PCP:  [facility]   [facility] Providers Cardiologist:  Kristeen Miss, MD }     Patient Profile:   Raymond White is a 70 y.o. male with a hx of HFpEF, CAD with [facility]), HTN, HLD, Parkinson's disease, DDD and chronic back pain, hx of CVA, ?PAF, and alcohol abuse/dependence who is being seen 12/31/2020 for the evaluation of VTach at the request of Dr. Butler Denmark.  History of Present Illness:   [name] was seen in Jan 2022 for dyspnea. Echo at that time with preserved EF of 55-60%, grade 1 DD, ascending aorta dilation of 39 mm, and no significant valvular abnormalities. CPX and PFTs were recommended. He has been seen at the [facility] for CAD. Progress note on 08/29/20 noted that he was on triple therapy with ASA, plavix, and pradaxa. He states after a TIA, he had a loop recorder implanted which showed Afib. Chart was reviewed extensively and pt was remotely on pradaxa in [place], but has since been on ASA and plavix only. No mention of Afib 2019-2021 in care everywhere. Prior providers were unable to find any evidence of Afib. Pt was on 75 mg pradaxa. Pt was also living in transitional housing/alcohol rehab facility last last fall. Pt has received care at both [place] and [place] facilities. Pt continued to struggle with alcohol abuse prior to last admission in Jan 2022. PFTs with severe emphysema, but also with paralyzed right hemidiaphragm.  Right and left heart cath were entertained but not pursued given his PFTs. No further workup hat admission. There is mention of bacteremia in a note 08/31/19 at the [facility] - details are unclear.   He unfortunately had COVID-19 in Feb 2022 associated with a fall, he was admitted and evaluated by neurosurgery, no surgery. He was discharged to a SNF. He was seen in the ER 10/03/20 with back  pain, continued nubmness and bowel and urinary incontinence. MRI imaging was stable and he was discharged back to [facility] without admission.   He presented to [facility] 12/28/20 with dyspnea, bilateral lower extremity edema, and weight gain. He was maintained on 40 mg lasix BID at home, but recently increased to [facility] B[facility] by [facility] (3 weeks ago). Dry weight thought to be 215 lbs. He drinks 3-4L of fluid daily. He ws admitted for acute hypoxic respiratory failure. At around 0500 on 12/30/20, pt had NSVT of about 30 beats. RN evaluated the patient who was lying in bed without distress. He did coplaint of SOB and difficutly focusing and speaking. Pt complained of headache, blurry vision, and left sided weakness and CODE Stroke was called. Neurology evaluated. CT head negative. Neurology felt weakness and photophobia likely related to headache, did not suspect TIA or CVA. MRI brain 12/30/20 negative for stroke. No further workup. Cardiology was consulted for NSVT.   During my exam, he reports ongoing dyspnea on exertion that started prior to Jan 2022 but worsened after COVID-19 infection in Feb 2022. He chronically sleeps on 4 pillows due to back pain, but also describes PND. He has had lower extremity swelling in his legs for several weeks - he reports when this happens, he requires IV lasix to resolve the swelling. He reports standing at the stove in his kitchen when he had sudden onset feeling that he was gong to go to sleep. He denies  loss of consciousness and does not think he felt like he would pass out, just that he needed to go to sleep immediately. He called his caretaker who called EMS for the patient. He does not remember being told he had Afib, just that he would receive calls periodically from [place] while his ILR in place to check on him because there was something abnormal on his loop. He was completely asymptomatic during the 35 beat run of VT yesterday morning at about 0436, but does states that he  had right sided upper abdominal pain that was pleuritic that woke him from sleep shortly after. He also recounts severe headache coincided with left sided weakness. He is no longer having headache or weakness. He is no longer having RUQ pain worse with inspiration. He has history of cholecystectomy. He has a history of right hemidiaphragm surgery. He had a stroke in 2018. He does not recall which coronary artery was stented in 2010. He is eager for discharge today.   Past Medical History:  Diagnosis Date  . Acute cystitis with hematuria   . Acute midline low back pain with sciatica   . CHF (congestive heart failure) (HCC)   . Chronic heart failure with preserved ejection fraction (HCC)   . DDD (degenerative disc disease), lumbar   . Depressive disorder   . Drug induced constipation   . GERD (gastroesophageal reflux disease)   . History of TIA (transient ischemic attack)   . Hypertension   . Pancreatitis   . Parkinson's disease (HCC) 2015  . Stroke (HCC)   . Uncomplicated alcohol dependence (HCC)   . Urinary incontinence     Past Surgical History:  Procedure Laterality Date  . APPENDECTOMY    . BACK SURGERY     x5  . CHOLECYSTECTOMY    . COLONOSCOPY    . ESOPHAGOGASTRODUODENOSCOPY    . KNEE ARTHROSCOPY    . neck fusion       Home Medications:  Prior to Admission medications   Medication Sig Start Date End Date Taking? Authorizing Provider  albuterol (PROVENTIL HFA;VENTOLIN HFA) 108 (90 Base) MCG/ACT inhaler Inhale 2 puffs into the lungs every 6 (six) hours as needed for wheezing or shortness of breath. Wait 1 minute in between each puff   Yes [provider]  aspirin 81 MG chewable tablet Chew 81 mg by mouth daily.   Yes [provider]  busPIRone (BUSPAR) 10 MG tablet Take 1 tablet (10 mg total) by mouth 3 (three) times daily. 08/31/20  Yes Lanae Boast, MD  carbidopa-levodopa (SINEMET IR) 25-100 MG tablet Take 2.5 tablets by mouth 3 (three) times daily.   Yes  [provider]  cholecalciferol (VITAMIN D3) 25 MCG (1000 UNIT) tablet Take 1,000 Units by mouth daily.   Yes [provider]  clopidogrel (PLAVIX) 75 MG tablet Take 75 mg by mouth daily.   Yes [provider]  DULoxetine (CYMBALTA) 30 MG capsule Take 30 mg by mouth 2 (two) times daily.   Yes [provider]  EPINEPHrine 0.3 mg/0.3 mL IJ SOAJ injection Inject 0.3 mg into the muscle daily as needed for anaphylaxis.   Yes [provider]  famotidine (PEPCID) 20 MG tablet Take 1 tablet (20 mg total) by mouth in the morning and at bedtime. 08/31/20  Yes Lanae Boast, MD  Fluticasone-Salmeterol (ADVAIR) 250-50 MCG/DOSE AEPB Inhale 1 puff into the lungs 2 (two) times daily.   Yes [provider]  furosemide (LASIX) 80 MG tablet Take 80  mg by mouth daily.   Yes [provider]  gabapentin (NEURONTIN) 300 MG capsule Take 300 mg by mouth 3 (three) times daily.   Yes [provider]  magnesium oxide (MAG-OX) 400 MG tablet Take 400 mg by mouth 2 (two) times daily.   Yes [provider]  Menthol, Topical Analgesic, (BIOFREEZE) 4 % GEL Apply 1 g topically 4 (four) times daily as needed (pain).   Yes [provider]  nortriptyline (PAMELOR) 10 MG capsule Take 10 mg by mouth at bedtime. 10/10/20  Yes [provider]  omeprazole (PRILOSEC) 20 MG capsule Take 20 mg by mouth daily as needed (heartburn/reflux).   Yes [provider]  potassium chloride SA (K-DUR,KLOR-CON) 20 MEQ tablet Take 20 mEq by mouth daily. With food   Yes [provider]  pregabalin (LYRICA) 75 MG capsule Take 75 mg by mouth 2 (two) times daily. 10/10/20  Yes [provider]  rosuvastatin (CRESTOR) 40 MG tablet Take 40 mg by mouth at bedtime.   Yes [provider]  spironolactone (ALDACTONE) 25 MG tablet Take 1 tablet (25 mg total) by mouth daily. 09/27/20  Yes Ghimire, Werner Lean, MD  tamsulosin (FLOMAX) 0.4 MG CAPS  capsule Take 0.4 mg by mouth at bedtime. Hold Dose For Systolic Pressure<100   Yes [provider]  thiamine (VITAMIN B-1) 100 MG tablet Take 1 tablet (100 mg total) by mouth daily. 08/31/20  Yes Lanae Boast, MD  vitamin B-12 (CYANOCOBALAMIN) 500 MCG tablet Take 1,000 mcg by mouth daily.   Yes [provider]  acetaminophen (TYLENOL) 500 MG tablet Take 500 mg by mouth every 6 (six) hours as needed for moderate pain. Patient not taking: No sig reported    [provider]  diphenoxylate-atropine (LOMOTIL) 2.5-0.025 MG tablet Take 1 tablet by mouth 3 (three) times daily as needed for diarrhea or loose stools. Patient not taking: No sig reported 08/31/20   Lanae Boast, MD  oxyCODONE (OXY IR/ROXICODONE) 5 MG immediate release tablet Take 1 tablet (5 mg total) by mouth every 6 (six) hours as needed for moderate pain. Patient not taking: No sig reported 09/27/20   Maretta Bees, MD  predniSONE (DELTASONE) 10 MG tablet Take 40 mg daily for 1 day, 30 mg daily for 1 day, 20 mg daily for 1 days,10 mg daily for 1 day, then stop Patient not taking: No sig reported 09/27/20   Maretta Bees, MD    Inpatient Medications: Scheduled Meds: . aspirin  81 mg Oral Daily  . busPIRone  10 mg Oral TID  . carbidopa-levodopa  2.5 tablet Oral TID  . cholecalciferol  1,000 Units Oral Daily  . clopidogrel  75 mg Oral Daily  . DULoxetine  30 mg Oral BID  . enoxaparin (LOVENOX) injection  40 mg Subcutaneous Q24H  . famotidine  20 mg Oral BID  . furosemide  80 mg Intravenous BID  . gabapentin  300 mg Oral TID  . magnesium oxide  400 mg Oral BID  . metoprolol tartrate  25 mg Oral BID  . nortriptyline  10 mg Oral QHS  . pregabalin  75 mg Oral BID  . rosuvastatin  40 mg Oral QHS  . tamsulosin  0.4 mg Oral QHS  . thiamine  100 mg Oral Daily  . vitamin B-12  1,000 mcg Oral Daily   Continuous Infusions:  PRN Meds: acetaminophen **OR** acetaminophen  Allergies:    Allergies  Allergen  Reactions  . Fish-Derived Products Anaphylaxis  .  Zoloft [Sertraline Hcl] Rash  . Sulfa Antibiotics Rash, Itching and Hives    Other reaction(s): Itching of eye, Eruption  . Sulfamethizole Rash    Social History:   Social History   Socioeconomic History  . Marital status: Single    Spouse name: Not on file  . Number of children: 0  . Years of education: Not on file  . Highest education level: Not on file  Occupational History  . Occupation: retired  Tobacco Use  . Smoking status: Never Smoker  . Smokeless tobacco: Never Used  Vaping Use  . Vaping Use: Never used  Substance and Sexual Activity  . Alcohol use: Not Currently    Comment: Vodka  . Drug use: Never  . Sexual activity: Not on file  Other Topics Concern  . Not on file  Social History Narrative   Widowed.  Never smoker.  Has history of alcohol abuse with vodka being beverage of choice.   Social Determinants of Health   Financial Resource Strain: Not on file  Food Insecurity: Not on file  Transportation Needs: Not on file  Physical Activity: Not on file  Stress: Not on file  Social Connections: Not on file  Intimate Partner Violence: Not on file    Family History:    Family History  Problem Relation Age of Onset  . Hyperlipidemia Mother   . Colon cancer Father      ROS:  Please see the history of present illness.   All other ROS reviewed and negative.     Physical Exam/Data:   Vitals:   12/30/20 1150 12/30/20 1711 12/30/20 2005 12/31/20 0435  BP: 110/70 129/85 117/80 105/74  Pulse: 78 93 100 80  Resp: Temp: 98 F (36.7 C) 98.1 F (36.7 C) 98 F (36.7 C) (!) 97.5 F (36.4 C)  TempSrc: Oral Oral Oral Oral  SpO2: 91% 91% 91% 94%  Weight:    101.2 kg  Height:        Intake/Output Summary (Last 24 hours) at 12/31/2020 0745 Last data filed at 12/30/2020 2200 Gross per 24 hour  Intake 837 ml  Output 1350 ml  Net -513 ml   Last 3 Weights 12/31/2020 12/30/2020 12/29/2020  Weight  (lbs) 223 lb 1.6 oz 225 lb 1.6 oz 227 lb 3.2 oz  Weight (kg) 101.197 kg 102.105 kg 103.057 kg     Body mass index is 29.43 kg/m.  General:  Well nourished, well developed, in no acute distress HEENT: normal Lymph: no adenopathy Neck: no JVD Endocrine:  No thryomegaly Vascular: No carotid bruits  Cardiac:  normal S1, S2; RRR; no murmur  Lungs:  clear to auscultation bilaterally, no wheezing, rhonchi or rales  Abd: soft, nontender, no hepatomegaly  Ext: no edema Musculoskeletal:  No deformities, BUE and BLE strength normal and equal Skin: warm and dry  Neuro:  CNs 2-12 intact, no focal abnormalities noted Psych:  Normal affect   EKG:  The EKG was personally reviewed and demonstrates:  NSR HR 85 Telemetry:  Telemetry was personally reviewed and demonstrates:  Sinus rhythm, PVCs, 35-beat run of VT 0436 12/30/20  Relevant CV Studies:  Echo 08/29/20: 1. Left ventricular ejection fraction, by estimation, is 55 to 60%. The  left ventricle has normal function. The left ventricle has no regional  wall motion abnormalities. Left ventricular diastolic parameters are  consistent with Grade I diastolic  dysfunction (impaired relaxation).  2. Right ventricular systolic function is normal. The right ventricular  size is normal. There is normal pulmonary artery systolic pressure.  3. The mitral valve is normal in structure. Trivial mitral valve  regurgitation. No evidence of mitral stenosis.  4. The aortic valve is normal in structure. Aortic valve regurgitation is  mild. No aortic stenosis is present.  5. Aortic dilatation noted. There is mild dilatation of the ascending  aorta, measuring 39 mm. There is mild dilatation at the level of the  sinuses of Valsalva, measuring 38 mm.  6. The inferior vena cava is normal in size with greater than 50%  respiratory variability, suggesting right atrial pressure of 3 mmHg.   Laboratory Data:  High Sensitivity Troponin:  No results for input(s):  TROPONINIHS in the last 720 hours.   Chemistry Recent Labs  Lab 12/28/20 2159 12/30/20 0246  NA 137 138  K 3.9 3.5  CL 104 101  CO2 27 28  GLUCOSE 102* 105*  BUN 12 13  CREATININE 0.75 0.85  CALCIUM 8.6* 8.9  GFRNONAA >60 >60  ANIONGAP 6 9    Recent Labs  Lab 12/28/20 2159  PROT 5.2*  ALBUMIN 3.1*  AST 15  ALT 9  ALKPHOS 58  BILITOT 0.8   Hematology Recent Labs  Lab 12/28/20 2159 12/30/20 0246  WBC 5.4 7.8  RBC 3.91* 4.69  HGB 13.0 15.6  HCT 39.5 45.9  MCV 101.0* 97.9  MCH 33.2 33.3  MCHC 32.9 34.0  RDW 14.2 14.4  PLT 138* 156   BNP Recent Labs  Lab 12/28/20 2159  BNP 198.3*    DDimer  Recent Labs  Lab 12/29/20 0226  DDIMER 0.40     Radiology/Studies:  DG Chest 2 View  Result Date: 12/29/2020 CLINICAL DATA:  Habits shortness of breath and RIGHT-sided chest pain. History of pulmonary and diaphragmatic surgery in the past. EXAM: CHEST - 2 VIEW COMPARISON:  Dec 28, 2020 and September 23, 2020. FINDINGS: EKG leads project over the chest. Trachea is midline. Cardiomediastinal contours and hilar structures are stable. Lung volumes remain diminished. Colonic interposition beneath the RIGHT hemidiaphragm is noted as on previous imaging. No consolidation or sign of pleural effusion. Basilar scarring over the RIGHT lung base is similar to the prior exam. On limited assessment no acute skeletal process. Signs of prior thoracotomy in the RIGHT chest. IMPRESSION: Stable appearance of the chest.  No acute cardiopulmonary disease. Electronically Signed   By: Donzetta KohutGeoffrey  Wile M.D.   On: 12/29/2020 14:27   CT CHEST WO CONTRAST  Result Date: 12/29/2020 CLINICAL DATA:  Respiratory failure. EXAM: CT CHEST WITHOUT CONTRAST TECHNIQUE: Multidetector CT imaging of the chest was performed following the standard protocol without IV contrast. COMPARISON:  Recent radiographs.  Most recent chest CT 09/23/2020 FINDINGS: Cardiovascular: Normal caliber thoracic aorta with mild  atherosclerosis. Normal heart size with coronary artery calcifications. No pericardial effusion. Mediastinum/Nodes: Scattered small mediastinal lymph nodes not enlarged by size criteria. Limited assessment for hilar adenopathy on this noncontrast exam. Small hiatal hernia with postsurgical change of the gastroesophageal junction. No visualized thyroid nodule. Lungs/Pleura: Unchanged elevation of the right hemidiaphragm with right basilar pleural thickening calcifications, query prior talc pleurodesis. Overall no significant change from prior exam. Similar right basilar scarring. Overall improvement in the peripheral ground-glass opacities on prior CT. Faint subpleural reticulation noted in the lingula and left lower lobe. Minimal subsegmental atelectasis or scarring in the anterior right upper lobe. No dominant pulmonary nodule or suspicious mass. Trachea and central bronchi are patent. No significant pleural effusion. Upper Abdomen: No acute findings.  Cholecystectomy. Colonic interposition under the right hemidiaphragm. Musculoskeletal: Straightening of normal thoracic kyphosis. Multilevel degenerative change. Remote postsurgical or posttraumatic change of right posterior ribs. No acute osseous findings. IMPRESSION: 1. Overall improvement in the peripheral ground-glass opacities from prior CT. Faint subpleural reticulation in the lingula and left lower lobe may represent post infectious or inflammatory scarring or potentially interstitial lung disease. 2. Unchanged elevation of the right hemidiaphragm with right basilar scarring, pleural thickening and calcifications, query prior talc pleurodesis. 3. Coronary artery calcifications.  Aortic atherosclerosis. Aortic Atherosclerosis (ICD10-I70.0). Electronically Signed   By: Narda Rutherford M.D.   On: 12/29/2020 21:40   MR BRAIN WO CONTRAST  Result Date: 12/30/2020 CLINICAL DATA:  Code stroke follow-up, left-sided weakness EXAM: MRI HEAD WITHOUT CONTRAST  TECHNIQUE: Multiplanar, multiecho pulse sequences of the brain and surrounding structures were obtained without intravenous contrast. COMPARISON:  03/31/2020 FINDINGS: Brain: There is no acute infarction or intracranial hemorrhage. There is no intracranial mass, mass effect, or edema. There is no hydrocephalus or extra-axial fluid collection. Prominence of the ventricles and sulci reflects generalized parenchymal volume loss. Minimal patchy T2 hyperintensity in the supratentorial and pontine white matter is nonspecific but may reflect minor chronic microvascular ischemic changes. Vascular: Major vessel flow voids at the skull base are preserved. Skull and upper cervical spine: Normal marrow signal is preserved. Sinuses/Orbits: Mild lobular maxillary sinus mucosal thickening. Orbits are unremarkable. Other: Sella is unremarkable.  Mastoid air cells are clear. IMPRESSION: No evidence of recent infarction, hemorrhage, or mass. Minor chronic microvascular ischemic changes. Electronically Signed   By: Guadlupe Spanish M.D.   On: 12/30/2020 14:01   DG Chest Port 1 View  Result Date: 12/28/2020 CLINICAL DATA:  Shortness of breath EXAM: PORTABLE CHEST 1 VIEW COMPARISON:  09/24/2020 FINDINGS: Low lung volumes. The left lung is grossly clear. Possible small right effusion with right basilar atelectasis or mild pneumonia. Aortic atherosclerosis. No pneumothorax. IMPRESSION: 1. Cardiomegaly 2. Possible small right effusion with atelectasis or mild pneumonia right base Electronically Signed   By: Jasmine Pang M.D.   On: 12/28/2020 22:13   CT HEAD CODE STROKE WO CONTRAST  Result Date: 12/30/2020 CLINICAL DATA:  Code stroke. Left-sided weakness with aphasia and neglect. EXAM: CT HEAD WITHOUT CONTRAST TECHNIQUE: Contiguous axial images were obtained from the base of the skull through the vertex without intravenous contrast. COMPARISON:  09/23/2020 FINDINGS: Brain: No evidence of acute infarction, hemorrhage, hydrocephalus,  extra-axial collection or mass lesion/mass effect. Cerebral volume loss without specific pattern. Vascular: No hyperdense vessel or unexpected calcification. Skull: Normal. Negative for fracture or focal lesion. Sinuses/Orbits: No acute finding. Other: These results were called by telephone at the time of interpretation on 12/30/2020 at 5:37 am to provider MCNE[facility] , who verbally acknowledged these results. ASPECTS [facility] Stroke Program Early CT Score) - Ganglionic level infarction (caudate, lentiform nuclei, internal capsule, insula, M1-M3 cortex): 7 - Supraganglionic infarction (M4-M6 cortex): 3 Total score (0-10 with 10 being normal): 10 IMPRESSION: No acute finding.  Aspects is 10. Electronically Signed   By: Marnee Spring M.D.   On: 12/30/2020 05:38   CT ANGIO HEAD NECK W WO CM W PERF (CODE STROKE)  Addendum Date: 12/30/2020   ADDENDUM REPORT: 12/30/2020 06:10 ADDENDUM: CT perfusion was performed using rapid software after bolus administration of iodinated contrast. No penumbra or infarct detected. Electronically Signed   By: Marnee Spring M.D.   On: 12/30/2020 06:10   Result Date: 12/30/2020 CLINICAL DATA:  Stroke follow-up EXAM: CT ANGIOGRAPHY HEAD AND  NECK TECHNIQUE: Multidetector CT imaging of the head and neck was performed using the standard protocol during bolus administration of intravenous contrast. Multiplanar CT image reconstructions and MIPs were obtained to evaluate the vascular anatomy. Carotid stenosis measurements (when applicable) are obtained utilizing NASCET criteria, using the distal internal carotid diameter as the denominator. CONTRAST:  Dose is currently not known on this in progress study COMPARISON:  Head CT from earlier the same day FINDINGS: CTA NECK FINDINGS Aortic arch: Limited coverage which is unremarkable. Right carotid system: No evidence of dissection, stenosis (50% or greater) or occlusion. Minimal bifurcation atheromatous changes Left carotid system: No  evidence of dissection, stenosis (50% or greater) or occlusion. Mild bifurcation atherosclerosis. Vertebral arteries: No proximal subclavian stenosis. Left dominant vertebral artery. Both vertebral arteries are smoothly contoured and widely patent to the dura. Skeleton: Generalized degenerative disease of the cervical spine. There is C4-C7 ACDF with solid arthrodesis. C2-3 facet ankylosis. Other neck: No acute finding Upper chest: Negative Review of the MIP images confirms the above findings CTA HEAD FINDINGS Anterior circulation: Notable lack of atheromatous changes for age. No branch occlusion, stenosis, aneurysm, or vascular malformation. Posterior circulation: Strong left vertebral artery dominance. The right vertebral artery essentially ends in the PICA. Diffusely patent basilar. Small basilar in the setting of fetal type PCA flow on the right more than left. Venous sinuses: Negative Anatomic variants: None significant Review of the MIP images confirms the above findings IMPRESSION: No emergent finding. Mild atherosclerosis without stenosis. Electronically Signed: By: Marnee Spring M.D. 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, right hemidiaphragm, question prior talc pleurodesis - he appears euvolemic today following IV diuresis, although weight is relatively stable from admission - we discussed low sodium and a 2L  fluid restriction per day - he is currently drinking about 5 L daily - resume home lasix   CAD - unclear details of PCI in ?2010 - remains on ASA and plavix - continue crestor - no BB given bradycardia - has not been bradycardic here - coronary artery calcifications and aortic athereosclerosis - would have low threshold to add BB after heart monitor results   Hypertension - 25 mg spironolactone - well controlled   TAA - annual screening - control BP   Hx of CVA - ASA, plavix, and statin    Risk Assessment/Risk Scores:    New York Heart Association (NYHA) Functional Class NYHA Class I        For questions or updates, please contact [facility] Please consult [email or web address] for contact info under    Signed, Marcelino Duster, PA  12/31/2020 7:45 AM

## 2020-12-31 NOTE — Progress Notes (Signed)
*  PRELIMINARY RESULTS* Echocardiogram 2D Echocardiogram has been performed.  Stacey Drain 12/31/2020, 4:57 PM

## 2021-01-01 ENCOUNTER — Inpatient Hospital Stay (HOSPITAL_COMMUNITY): Payer: Medicare Other

## 2021-01-01 DIAGNOSIS — I5031 Acute diastolic (congestive) heart failure: Secondary | ICD-10-CM

## 2021-01-01 DIAGNOSIS — I2510 Atherosclerotic heart disease of native coronary artery without angina pectoris: Secondary | ICD-10-CM

## 2021-01-01 DIAGNOSIS — R001 Bradycardia, unspecified: Secondary | ICD-10-CM

## 2021-01-01 MED ORDER — TECHNETIUM TC 99M TETROFOSMIN IV KIT
11.0000 | PACK | Freq: Once | INTRAVENOUS | Status: AC | PRN
  Administered 2021-01-01: 11 via INTRAVENOUS

## 2021-01-01 MED ORDER — POTASSIUM CHLORIDE CRYS ER 20 MEQ PO TBCR
20.0000 meq | EXTENDED_RELEASE_TABLET | Freq: Every day | ORAL | Status: DC
  Administered 2021-01-01 – 2021-01-02 (×2): 20 meq via ORAL
  Filled 2021-01-01 (×2): qty 1

## 2021-01-01 MED ORDER — FUROSEMIDE 80 MG PO TABS
80.0000 mg | ORAL_TABLET | Freq: Every day | ORAL | Status: DC
  Administered 2021-01-01 – 2021-01-02 (×2): 80 mg via ORAL
  Filled 2021-01-01 (×2): qty 1

## 2021-01-01 NOTE — Progress Notes (Addendum)
PROGRESS NOTE    Raymond White   WUJ:[id number]  DOB: [birth year]  DOA: 12/28/2020 PCP: [facility]   Brief Narrative:  Raymond White is a 70 y.o. male with medical history significant of chronic diastolic CHF, CAD, hypertension, hyperlipidemia, Parkinson's disease, CVA, chronic back pain, COVID-19 viral pneumonia in February 2022 presented to the ED via EMS for evaluation of dyspnea, bilateral lower extremity edema, and weight gain. He also describes an episode while standing at the sink where he felt suddenly fatigued and needed to sleep. This feeling was associated with some shortness of breath and resolved quickly.  Weight increased from 200 to about 238 lb lately.   In the ED >  Weight 224, initial ambulatory pulse ox 97% with dyspnea on exertion, repeat ambulatory pulse oxs x 2 separate walks the following AM 82 and then 89%.   5/12 night> 34 beat run of V tach- he states he had right sided pain, points to area below right rib case around the same time- not long after, developed left sided numbness, headache and visual changes. Code stroke called- suspected to be complicated Migraine and given IV Reglan. Symptoms resolved.  Subjective: No new complaints.   Assessment & Plan:   Principal Problem:   Acute respiratory failure with hypoxia - suspecting acute CHF and diuresing with IV Lasix- takes 80 mg oral Lasix at home daily- he cannot remember if he takes Spironolactone but it's on his med list - prior ECHO showed normal EF and grade 1 d CHF but may have greater diastolic dysfunction than seen - CT chest > Overall improvement in the peripheral ground-glass opacities from prior CT. Faint subpleural reticulation in the lingula and left lower lobe may represent post infectious or inflammatory scarring or potentially interstitial lung disease. - stop IV Lasix today-   Active Problems: V tach overnight 5/12 - associated with right lower chest pain - IV  Metoprolol given- oral Metoprolol started -  I have consulted cardiology - they recommend a stress tes & repeating ECHO - K 3.5, Mg 1.9  Headache with left sided numbness and visual changes 5/12 - code stroke called- initial CT head negative - ? Complicated migraine- Reglan given- symptoms resolved - f/u MRI      Parkinson disease  - cont Sinemet     CAD (coronary artery disease) -cont ASA, Plavix and Crestor  BPH - Flomax  Vit B 12 deficiency - cont supplements- B12 level is 200-  given 1 s/c injection of 1000 mcg on 5/13  Anxiety/depression - Buspar, Cymbalta  GERD?  - cont Pepcid BID  Thrombocytopenia - appears chronic per labs    Time spent in minutes: 35 DVT prophylaxis: enoxaparin (LOVENOX) injection 40 mg Start: 12/29/20 1000  Code Status: full code Family Communication:  Level of Care: Level of care: Telemetry Cardiac Disposition Plan:  Status is: Inpatient  Remains inpatient appropriate because:Inpatient level of care appropriate due to severity of illness   Dispo: The patient is from: Home              Anticipated d/c is to: home              Patient currently is not medically stable to d/c.   Difficult to place patient No      Consultants:   Cardiology  Neurology  Procedures:    Antimicrobials:  Anti-infectives (From admission, onward)   None       Objective: Vitals:   12/31/20 0838 12/31/20 1105  12/31/20 1947 01/01/21 0427  BP: 114/72 107/75 (!) 128/91 108/67  Pulse: 86 73 83 73  Resp:  18 16 16   Temp:  97.9 F (36.6 C) 98.6 F (37 C) 98.3 F (36.8 C)  TempSrc:  Oral Oral Oral  SpO2:  92% 91% 93%  Weight:    100.8 kg  Height:        Intake/Output Summary (Last 24 hours) at 01/01/2021 0820 Last data filed at 12/31/2020 2000 Gross per 24 hour  Intake 957 ml  Output 2125 ml  Net -1168 ml   Filed Weights   12/30/20 0645 12/31/20 0435 01/01/21 0427  Weight: 102.1 kg 101.2 kg 100.8 kg    Examination: General  exam: Appears comfortable  HEENT: PERRLA, oral mucosa moist, no sclera icterus or thrush Respiratory system: Clear to auscultation. Respiratory effort normal. Cardiovascular system: S1 & S2 heard, regular rate and rhythm Gastrointestinal system: Abdomen soft, non-tender, nondistended. Normal bowel sounds   Central nervous system: Alert and oriented. No focal neurological deficits. Extremities: No cyanosis, clubbing or edema Skin: No rashes or ulcers Psychiatry:  Mood & affect appropriate.     Data Reviewed: I have personally reviewed following labs and imaging studies  CBC: Recent Labs  Lab 12/28/20 2159 12/30/20 0246  WBC 5.4 7.8  NEUTROABS 3.6  --   HGB 13.0 15.6  HCT 39.5 45.9  MCV 101.0* 97.9  PLT 138* 156   Basic Metabolic Panel: Recent Labs  Lab 12/28/20 2159 12/30/20 0246 12/31/20 0739  NA 137 138 138  K 3.9 3.5 3.6  CL 104 101 99  CO2 27 28 30   GLUCOSE 102* 105* 123*  BUN 12 13 13   CREATININE 0.75 0.85 0.96  CALCIUM 8.6* 8.9 8.8*  MG  --  1.9  --    GFR: Estimated Creatinine Clearance: 89.4 mL/min (by C-G formula based on SCr of 0.96 mg/dL). Liver Function Tests: Recent Labs  Lab 12/28/20 2159  AST 15  ALT 9  ALKPHOS 58  BILITOT 0.8  PROT 5.2*  ALBUMIN 3.1*   No results for input(s): LIPASE, AMYLASE in the last 168 hours. No results for input(s): AMMONIA in the last 168 hours. Coagulation Profile: No results for input(s): INR, PROTIME in the last 168 hours. Cardiac Enzymes: No results for input(s): CKTOTAL, CKMB, CKMBINDEX, TROPONINI in the last 168 hours. BNP (last 3 results) No results for input(s): PROBNP in the last 8760 hours. HbA1C: No results for input(s): HGBA1C in the last 72 hours. CBG: Recent Labs  Lab 12/30/20 0509  GLUCAP 123*   Lipid Profile: No results for input(s): CHOL, HDL, LDLCALC, TRIG, CHOLHDL, LDLDIRECT in the last 72 hours. Thyroid Function Tests: No results for input(s): TSH, T4TOTAL, FREET4, T3FREE, THYROIDAB  in the last 72 hours. Anemia Panel: Recent Labs    12/30/20 0246  VITAMINB12 200   Urine analysis:    Component Value Date/Time   COLORURINE YELLOW 10/03/2020 1514   APPEARANCEUR CLEAR 10/03/2020 1514   LABSPEC 1.006 10/03/2020 1514   PHURINE 6.0 10/03/2020 1514   GLUCOSEU NEGATIVE 10/03/2020 1514   HGBUR NEGATIVE 10/03/2020 1514   BILIRUBINUR NEGATIVE 10/03/2020 1514   KETONESUR NEGATIVE 10/03/2020 1514   PROTEINUR NEGATIVE 10/03/2020 1514   NITRITE NEGATIVE 10/03/2020 1514   LEUKOCYTESUR TRACE (A) 10/03/2020 1514   Sepsis Labs: @LABRCNTIP (procalcitonin:4,lacticidven:4) ) Recent Results (from the past 240 hour(s))  Resp Panel by RT-PCR (Flu A&B, Covid) Nasopharyngeal Swab     Status: None   Collection Time: 12/28/20 10:00 PM  Specimen: Nasopharyngeal Swab; Nasopharyngeal(NP) swabs in vial transport medium  Result Value Ref Range Status   SARS Coronavirus 2 by RT PCR NEGATIVE NEGATIVE Final    Comment: (NOTE) SARS-CoV-2 target nucleic acids are NOT DETECTED.  The SARS-CoV-2 RNA is generally detectable in upper respiratory specimens during the acute phase of infection. The lowest concentration of SARS-CoV-2 viral copies this assay can detect is 138 copies/mL. A negative result does not preclude SARS-Cov-2 infection and should not be used as the sole basis for treatment or other patient management decisions. A negative result may occur with  improper specimen collection/handling, submission of specimen other than nasopharyngeal swab, presence of viral mutation(s) within the areas targeted by this assay, and inadequate number of viral copies(<138 copies/mL). A negative result must be combined with clinical observations, patient history, and epidemiological information. The expected result is Negative.  Fact Sheet for Patients:  BloggerCourse.comhttps://www.fda.gov/media/152166/download  Fact Sheet for Healthcare Providers:  SeriousBroker.ithttps://www.fda.gov/media/152162/download  This test is no  t yet approved or cleared by the [place] FDA and  has been authorized for detection and/or diagnosis of SARS-CoV-2 by FDA under an Emergency Use Authorization (EUA). This EUA will remain  in effect (meaning this test can be used) for the duration of the COVID-19 declaration under Section 564(b)(1) of the Act, 21 U.S.C.section 360bbb-3(b)(1), unless the authorization is terminated  or revoked sooner.       Influenza A by PCR NEGATIVE NEGATIVE Final   Influenza B by PCR NEGATIVE NEGATIVE Final    Comment: (NOTE) The Xpert Xpress SARS-CoV-2/FLU/RSV plus assay is intended as an aid in the diagnosis of influenza from Nasopharyngeal swab specimens and should not be used as a sole basis for treatment. Nasal washings and aspirates are unacceptable for Xpert Xpress SARS-CoV-2/FLU/RSV testing.  Fact Sheet for Patients: BloggerCourse.comhttps://www.fda.gov/media/152166/download  Fact Sheet for Healthcare Providers: SeriousBroker.ithttps://www.fda.gov/media/152162/download  This test is not yet approved or cleared by the [place] FDA and has been authorized for detection and/or diagnosis of SARS-CoV-2 by FDA under an Emergency Use Authorization (EUA). This EUA will remain in effect (meaning this test can be used) for the duration of the COVID-19 declaration under Section 564(b)(1) of the Act, 21 U.S.C. section 360bbb-3(b)(1), unless the authorization is terminated or revoked.  Performed at [facility], [address] [place], [place] [address]          Radiology Studies: MR BRAIN WO CONTRAST  Result Date: 12/30/2020 CLINICAL DATA:  Code stroke follow-up, left-sided weakness EXAM: MRI HEAD WITHOUT CONTRAST TECHNIQUE: Multiplanar, multiecho pulse sequences of the brain and surrounding structures were obtained without intravenous contrast. COMPARISON:  03/31/2020 FINDINGS: Brain: There is no acute infarction or intracranial hemorrhage. There is no intracranial mass, mass effect, or edema. There is no  hydrocephalus or extra-axial fluid collection. Prominence of the ventricles and sulci reflects generalized parenchymal volume loss. Minimal patchy T2 hyperintensity in the supratentorial and pontine white matter is nonspecific but may reflect minor chronic microvascular ischemic changes. Vascular: Major vessel flow voids at the skull base are preserved. Skull and upper cervical spine: Normal marrow signal is preserved. Sinuses/Orbits: Mild lobular maxillary sinus mucosal thickening. Orbits are unremarkable. Other: Sella is unremarkable.  Mastoid air cells are clear. IMPRESSION: No evidence of recent infarction, hemorrhage, or mass. Minor chronic microvascular ischemic changes. Electronically Signed   By: Guadlupe SpanishPraneil  Patel M.D.   On: 12/30/2020 14:01   ECHOCARDIOGRAM COMPLETE  Result Date: 12/31/2020    ECHOCARDIOGRAM REPORT   Patient Name:   Raymond White Date of Exam: 12/31/2020 Medical Rec #:  [id number]         Height:       73.0 in Accession #:    [id number]        Weight:       223.1 lb Date of Birth:  [birth year]         BSA:          2.254 m Patient Age:    70 years          BP:           107/75 mmHg Patient Gender: M                 HR:           73 bpm. Exam Location:  Inpatient Procedure: 2D Echo, Cardiac Doppler and Color Doppler Indications:    Ventricular Tachycardia I47.2  History:        Patient has prior history of Echocardiogram examinations. CHF,                 CAD; Risk Factors:Hypertension. Parkinson's.  Sonographer:    Celesta Gentile RCS Referring Phys: [id number] ANGELA NICOLE DUKE IMPRESSIONS  1. Left ventricular ejection fraction, by estimation, is 60 to 65%. The left ventricle has normal function. The left ventricle has no regional wall motion abnormalities. There is mild concentric left ventricular hypertrophy. Left ventricular diastolic parameters are consistent with Grade I diastolic dysfunction (impaired relaxation).  2. Right ventricular systolic function is normal. The right ventricular  size is normal. Tricuspid regurgitation signal is inadequate for assessing PA pressure.  3. The mitral valve is normal in structure. No evidence of mitral valve regurgitation. No evidence of mitral stenosis.  4. The aortic valve is tricuspid. Aortic valve regurgitation is mild. Mild to moderate aortic valve sclerosis/calcification is present, without any evidence of aortic stenosis. Aortic regurgitation PHT measures 536 msec.  5. Aortic dilatation noted. There is mild dilatation of the aortic root, measuring 38 mm. FINDINGS  Left Ventricle: Left ventricular ejection fraction, by estimation, is 60 to 65%. The left ventricle has normal function. The left ventricle has no regional wall motion abnormalities. The left ventricular internal cavity size was normal in size. There is  mild concentric left ventricular hypertrophy. Left ventricular diastolic parameters are consistent with Grade I diastolic dysfunction (impaired relaxation). Normal left ventricular filling pressure. Right Ventricle: The right ventricular size is normal. No increase in right ventricular wall thickness. Right ventricular systolic function is normal. Tricuspid regurgitation signal is inadequate for assessing PA pressure. Left Atrium: Left atrial size was normal in size. Right Atrium: Right atrial size was normal in size. Pericardium: There is no evidence of pericardial effusion. Mitral Valve: The mitral valve is normal in structure. No evidence of mitral valve regurgitation. No evidence of mitral valve stenosis. Tricuspid Valve: The tricuspid valve is normal in structure. Tricuspid valve regurgitation is not demonstrated. No evidence of tricuspid stenosis. Aortic Valve: The aortic valve is tricuspid. Aortic valve regurgitation is mild. Aortic regurgitation PHT measures 536 msec. Mild to moderate aortic valve sclerosis/calcification is present, without any evidence of aortic stenosis. Pulmonic Valve: The pulmonic valve was normal in structure.  Pulmonic valve regurgitation is mild. No evidence of pulmonic stenosis. Aorta: Aortic dilatation noted. There is mild dilatation of the aortic root, measuring 38 mm. IAS/Shunts: No atrial level shunt detected by color flow Doppler.  LEFT VENTRICLE PLAX 2D LVIDd:         4.60 cm  Diastology LVIDs:         2.50 cm  LV e' medial:    5.77 cm/s LV PW:         1.10 cm  LV E/e' medial:  10.4 LV IVS:        1.15 cm  LV e' lateral:   8.59 cm/s LVOT diam:     2.00 cm  LV E/e' lateral: 7.0 LV SV:         62 LV SV Index:   27 LVOT Area:     3.14 cm  RIGHT VENTRICLE RV S prime:     17.10 cm/s TAPSE (M-mode): 1.9 cm LEFT ATRIUM             Index LA diam:        4.25 cm 1.89 cm/m LA Vol (A2C):   56.4 ml 25.02 ml/m LA Vol (A4C):   35.0 ml 15.53 ml/m LA Biplane Vol: 44.5 ml 19.74 ml/m  AORTIC VALVE LVOT Vmax:   101.00 cm/s LVOT Vmean:  74.000 cm/s LVOT VTI:    0.197 m AI PHT:      536 msec  AORTA Ao Root diam: 4.00 cm MITRAL VALVE MV Area (PHT): 2.99 cm    SHUNTS MV Decel Time: 254 msec    Systemic VTI:  0.20 m MV E velocity: 60.00 cm/s  Systemic Diam: 2.00 cm MV A velocity: 73.00 cm/s MV E/A ratio:  0.82 Armanda Magic MD Electronically signed by Armanda Magic MD Signature Date/Time: 12/31/2020/6:25:20 PM    Final       Scheduled Meds: . aspirin  81 mg Oral Daily  . busPIRone  10 mg Oral TID  . carbidopa-levodopa  2.5 tablet Oral TID  . cholecalciferol  1,000 Units Oral Daily  . clopidogrel  75 mg Oral Daily  . DULoxetine  30 mg Oral BID  . enoxaparin (LOVENOX) injection  40 mg Subcutaneous Q24H  . famotidine  20 mg Oral BID  . gabapentin  300 mg Oral TID  . magnesium oxide  400 mg Oral BID  . metoprolol tartrate  25 mg Oral BID  . nortriptyline  10 mg Oral QHS  . pregabalin  75 mg Oral BID  . rosuvastatin  40 mg Oral QHS  . tamsulosin  0.4 mg Oral QHS  . thiamine  100 mg Oral Daily  . vitamin B-12  1,000 mcg Oral Daily   Continuous Infusions:   LOS: 3 days      Calvert Cantor, MD [facility] Pager: [email or web address] 01/01/2021, 8:20 AM

## 2021-01-01 NOTE — Progress Notes (Addendum)
Progress Note  Patient Name: Raymond White Date of Encounter: 01/01/2021  [facility] Cardiologist: Kristeen Miss, MD   Subjective   Denies any chest pain or SOB.  No further VT on tele.  Went down to [employer] for [employer] and IV infiltrated and procedure rescheduled for tomorrow  Inpatient Medications    Scheduled Meds: . aspirin  81 mg Oral Daily  . busPIRone  10 mg Oral TID  . carbidopa-levodopa  2.5 tablet Oral TID  . cholecalciferol  1,000 Units Oral Daily  . clopidogrel  75 mg Oral Daily  . DULoxetine  30 mg Oral BID  . enoxaparin (LOVENOX) injection  40 mg Subcutaneous Q24H  . famotidine  20 mg Oral BID  . gabapentin  300 mg Oral TID  . magnesium oxide  400 mg Oral BID  . metoprolol tartrate  25 mg Oral BID  . nortriptyline  10 mg Oral QHS  . pregabalin  75 mg Oral BID  . rosuvastatin  40 mg Oral QHS  . tamsulosin  0.4 mg Oral QHS  . thiamine  100 mg Oral Daily  . vitamin B-12  1,000 mcg Oral Daily   Continuous Infusions:  PRN Meds: acetaminophen **OR** acetaminophen   Vital Signs    Vitals:   12/31/20 0838 12/31/20 1105 12/31/20 1947 01/01/21 0427  BP: 114/72 107/75 (!) 128/91 108/67  Pulse: 86 73 83 73  Resp:  18 16 16   Temp:  97.9 F (36.6 C) 98.6 F (37 C) 98.3 F (36.8 C)  TempSrc:  Oral Oral Oral  SpO2:  92% 91% 93%  Weight:    100.8 kg  Height:        Intake/Output Summary (Last 24 hours) at 01/01/2021 1043 Last data filed at 01/01/2021 0800 Gross per 24 hour  Intake 717 ml  Output 1350 ml  Net -633 ml   Last 3 Weights 01/01/2021 12/31/2020 12/30/2020  Weight (lbs) 222 lb 3.2 oz 223 lb 1.6 oz 225 lb 1.6 oz  Weight (kg) 100.789 kg 101.197 kg 102.105 kg      Telemetry    NSR - Personally Reviewed  ECG    No new EKG to review - Personally Reviewed  Physical Exam   GEN: No acute distress.   Neck: No JVD Cardiac: RRR, no murmurs, rubs, or gallops.  Respiratory: Clear to auscultation bilaterally. GI: Soft, nontender,  non-distended  MS: No edema; No deformity. Neuro:  Nonfocal  Psych: Normal affect   Labs    High Sensitivity Troponin:  No results for input(s): TROPONINIHS in the last 720 hours.    Chemistry Recent Labs  Lab 12/28/20 2159 12/30/20 0246 12/31/20 0739  NA 137 138 138  K 3.9 3.5 3.6  CL 104 101 99  CO2 27 28 30   GLUCOSE 102* 105* 123*  BUN 12 13 13   CREATININE 0.75 0.85 0.96  CALCIUM 8.6* 8.9 8.8*  PROT 5.2*  --   --   ALBUMIN 3.1*  --   --   AST 15  --   --   ALT 9  --   --   ALKPHOS 58  --   --   BILITOT 0.8  --   --   GFRNONAA >60 >60 >60  ANIONGAP 6 9 9      Hematology Recent Labs  Lab 12/28/20 2159 12/30/20 0246  WBC 5.4 7.8  RBC 3.91* 4.69  HGB 13.0 15.6  HCT 39.5 45.9  MCV 101.0* 97.9  MCH 33.2 33.3  MCHC 32.9 34.0  RDW 14.2 14.4  PLT 138* 156    BNP Recent Labs  Lab 12/28/20 2159  BNP 198.3*     DDimer  Recent Labs  Lab 12/29/20 0226  DDIMER 0.40     CHA2DS2-VASc Score =    This indicates a  % annual risk of stroke. The patient's score is based upon:        Radiology    MR BRAIN WO CONTRAST  Result Date: 12/30/2020 CLINICAL DATA:  Code stroke follow-up, left-sided weakness EXAM: MRI HEAD WITHOUT CONTRAST TECHNIQUE: Multiplanar, multiecho pulse sequences of the brain and surrounding structures were obtained without intravenous contrast. COMPARISON:  03/31/2020 FINDINGS: Brain: There is no acute infarction or intracranial hemorrhage. There is no intracranial mass, mass effect, or edema. There is no hydrocephalus or extra-axial fluid collection. Prominence of the ventricles and sulci reflects generalized parenchymal volume loss. Minimal patchy T2 hyperintensity in the supratentorial and pontine white matter is nonspecific but may reflect minor chronic microvascular ischemic changes. Vascular: Major vessel flow voids at the skull base are preserved. Skull and upper cervical spine: Normal marrow signal is preserved. Sinuses/Orbits: Mild  lobular maxillary sinus mucosal thickening. Orbits are unremarkable. Other: Sella is unremarkable.  Mastoid air cells are clear. IMPRESSION: No evidence of recent infarction, hemorrhage, or mass. Minor chronic microvascular ischemic changes. Electronically Signed   By: Guadlupe Spanish M.D.   On: 12/30/2020 14:01   ECHOCARDIOGRAM COMPLETE  Result Date: 12/31/2020    ECHOCARDIOGRAM REPORT   Patient Name:   Raymond White Date of Exam: 12/31/2020 Medical Rec #:  [id number]         Height:       73.0 in Accession #:    [id number]        Weight:       223.1 lb Date of Birth:  09/13/1950         BSA:          2.254 m Patient Age:    70 years          BP:           107/75 mmHg Patient Gender: M                 HR:           73 bpm. Exam Location:  Inpatient Procedure: 2D Echo, Cardiac Doppler and Color Doppler Indications:    Ventricular Tachycardia I47.2  History:        Patient has prior history of Echocardiogram examinations. CHF,                 CAD; Risk Factors:Hypertension. Parkinson's.  Sonographer:    Celesta Gentile RCS Referring Phys: [id number] ANGELA NICOLE DUKE IMPRESSIONS  1. Left ventricular ejection fraction, by estimation, is 60 to 65%. The left ventricle has normal function. The left ventricle has no regional wall motion abnormalities. There is mild concentric left ventricular hypertrophy. Left ventricular diastolic parameters are consistent with Grade I diastolic dysfunction (impaired relaxation).  2. Right ventricular systolic function is normal. The right ventricular size is normal. Tricuspid regurgitation signal is inadequate for assessing PA pressure.  3. The mitral valve is normal in structure. No evidence of mitral valve regurgitation. No evidence of mitral stenosis.  4. The aortic valve is tricuspid. Aortic valve regurgitation is mild. Mild to moderate aortic valve sclerosis/calcification is present, without any evidence of aortic stenosis. Aortic regurgitation PHT measures 536 msec.  5. Aortic  dilatation noted. There  is mild dilatation of the aortic root, measuring 38 mm. FINDINGS  Left Ventricle: Left ventricular ejection fraction, by estimation, is 60 to 65%. The left ventricle has normal function. The left ventricle has no regional wall motion abnormalities. The left ventricular internal cavity size was normal in size. There is  mild concentric left ventricular hypertrophy. Left ventricular diastolic parameters are consistent with Grade I diastolic dysfunction (impaired relaxation). Normal left ventricular filling pressure. Right Ventricle: The right ventricular size is normal. No increase in right ventricular wall thickness. Right ventricular systolic function is normal. Tricuspid regurgitation signal is inadequate for assessing PA pressure. Left Atrium: Left atrial size was normal in size. Right Atrium: Right atrial size was normal in size. Pericardium: There is no evidence of pericardial effusion. Mitral Valve: The mitral valve is normal in structure. No evidence of mitral valve regurgitation. No evidence of mitral valve stenosis. Tricuspid Valve: The tricuspid valve is normal in structure. Tricuspid valve regurgitation is not demonstrated. No evidence of tricuspid stenosis. Aortic Valve: The aortic valve is tricuspid. Aortic valve regurgitation is mild. Aortic regurgitation PHT measures 536 msec. Mild to moderate aortic valve sclerosis/calcification is present, without any evidence of aortic stenosis. Pulmonic Valve: The pulmonic valve was normal in structure. Pulmonic valve regurgitation is mild. No evidence of pulmonic stenosis. Aorta: Aortic dilatation noted. There is mild dilatation of the aortic root, measuring 38 mm. IAS/Shunts: No atrial level shunt detected by color flow Doppler.  LEFT VENTRICLE PLAX 2D LVIDd:         4.60 cm  Diastology LVIDs:         2.50 cm  LV e' medial:    5.77 cm/s LV PW:         1.10 cm  LV E/e' medial:  10.4 LV IVS:        1.15 cm  LV e' lateral:   8.59 cm/s LVOT  diam:     2.00 cm  LV E/e' lateral: 7.0 LV SV:         62 LV SV Index:   27 LVOT Area:     3.14 cm  RIGHT VENTRICLE RV S prime:     17.10 cm/s TAPSE (M-mode): 1.9 cm LEFT ATRIUM             Index LA diam:        4.25 cm 1.89 cm/m LA Vol (A2C):   56.4 ml 25.02 ml/m LA Vol (A4C):   35.0 ml 15.53 ml/m LA Biplane Vol: 44.5 ml 19.74 ml/m  AORTIC VALVE LVOT Vmax:   101.00 cm/s LVOT Vmean:  74.000 cm/s LVOT VTI:    0.197 m AI PHT:      536 msec  AORTA Ao Root diam: 4.00 cm MITRAL VALVE MV Area (PHT): 2.99 cm    SHUNTS MV Decel Time: 254 msec    Systemic VTI:  0.20 m MV E velocity: 60.00 cm/s  Systemic Diam: 2.00 cm MV A velocity: 73.00 cm/s MV E/A ratio:  0.82 Armanda Magic MD Electronically signed by Armanda Magic MD Signature Date/Time: 12/31/2020/6:25:20 PM    Final     Cardiac Studies   2D echo 12/31/2020 IMPRESSIONS   1. Left ventricular ejection fraction, by estimation, is 60 to 65%. The  left ventricle has normal function. The left ventricle has no regional  wall motion abnormalities. There is mild concentric left ventricular  hypertrophy. Left ventricular diastolic  parameters are consistent with Grade I diastolic dysfunction (impaired  relaxation).  2. Right ventricular systolic function  is normal. The right ventricular  size is normal. Tricuspid regurgitation signal is inadequate for assessing  PA pressure.  3. The mitral valve is normal in structure. No evidence of mitral valve  regurgitation. No evidence of mitral stenosis.  4. The aortic valve is tricuspid. Aortic valve regurgitation is mild.  Mild to moderate aortic valve sclerosis/calcification is present, without  any evidence of aortic stenosis. Aortic regurgitation PHT measures 536  msec.  5. Aortic dilatation noted. There is mild dilatation of the aortic root,  measuring 38 mm.   Patient Profile     70 y.o. male  with a hx of HFpEF, CAD with PCI ?2010 (with [facility]), HTN, HLD, Parkinson's disease, DDD and chronic back pain,  hx of CVA, ?PAF, and alcohol abuse/dependence who is being seen 12/31/2020 for the evaluation of VTach at the request of Dr. Butler Denmarkizwan.  Assessment & Plan    NSVT/PVCs - He had an episode prior to admission where he suddenly became severely tired and needed to fall asleep but denied any dizziness or syncope. ? if his episode of severe sudden fatigue is related to a ventricular arrhythmia - he had an ILR but that was in 2018 and he said he was told he had some arrhythmias but nothing to worry about - K+ 3.5 and Mag 1.9 at time of his VT on tele and was asymptomatic at the time - he did not lose consciousness with his NSVT, he does not drive -he was NPO for Lexi myoview today but his IV infiltrated and will have to be done tomorrow -2D echo showed normal LVF -EP eval after ischemic workup completed -continue lopressor 25mg  BID>>started yesterday for VT  Chronic diastolic heart failure/DOE - recently home lasix was increased to 80 mg BID - BNP was mildly elevated at 198 on admit - D-dimer negative - CXR negative for signs of CHF - 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, right hemidiaphragm, question prior talc pleurodesis - he appears euvolemic today following IV diuresis, although weight is relatively stable from admission - we discussed low sodium and a 2L fluid restriction per day  - he is currently drinking about 5 L daily - will put back on home dose of Lasix 80mg  daily along with daily K+ supp - follow BMET closely  CAD - unclear details of PCI in ?2010 - remains on ASA and plavix - continue crestor - coronary artery calcifications and aortic athereosclerosis - continue Lopressor 25mg  BID>>has had bradycardic in the past but non so far on tele  Hypertension - Bp stable but soft - stop spiro - continue Lopressor 25mg  BID  Hx of CVA - ASA, plavix, and statin      For questions or updates, please contact [facility] Please consult [email or web address] for contact info under         Signed, Armanda Magicraci Peirce Deveney, MD  01/01/2021, 10:43 AM

## 2021-01-01 NOTE — Progress Notes (Signed)
PROGRESS NOTE    Raymond White   MBP:[id number]  DOB: 31-Oct-1950  DOA: 12/28/2020 PCP: [facility]   Brief Narrative:  Raymond White is a 70 y.o. male with medical history significant of chronic diastolic CHF, CAD, hypertension, hyperlipidemia, Parkinson's disease, CVA, chronic back pain, COVID-19 viral pneumonia in February 2022 presented to the ED via EMS for evaluation of dyspnea, bilateral lower extremity edema, and weight gain. He also describes an episode while standing at the sink where he felt suddenly fatigued and needed to sleep. This feeling was associated with some shortness of breath and resolved quickly.  Weight increased from 200 to about 238 lb lately.   In the ED >  Weight 224, initial ambulatory pulse ox 97% with dyspnea on exertion, repeat ambulatory pulse oxs x 2 separate walks the following AM 82 and then 89%.   5/12 night> 34 beat run of V tach- he states he had right sided pain, points to area below right rib case around the same time- not long after, developed left sided numbness, headache and visual changes. Code stroke called- suspected to be complicated Migraine and given IV Reglan. Symptoms resolved.  Subjective: No new complaints today.    Assessment & Plan:   Principal Problem:   Acute respiratory failure with hypoxia - suspecting acute CHF and diuresing with IV Lasix- takes 80 mg oral Lasix at home daily- he cannot remember if he takes Spironolactone but it's on his med list - prior ECHO showed normal EF and grade 1 d CHF but may have greater diastolic dysfunction than seen - CT chest > Overall improvement in the peripheral ground-glass opacities from prior CT. Faint subpleural reticulation in the lingula and left lower lobe may represent post infectious or inflammatory scarring or potentially interstitial lung disease. - switched to oral Lasix   Active Problems: V tach overnight 5/12 - associated with right lower chest pain - IV  Metoprolol given- oral Metoprolol started -  I have consulted cardiology - they recommend a stress tes & repeating ECHO - K 3.5, Mg 1.9 - cont Lopressor- unable to have stress test today due to IV infiltrating- radiology recommended re-imaging tomorrow  Headache with left sided numbness and visual changes 5/12 - code stroke called- initial CT head negative - ? Complicated migraine- Reglan given- symptoms resolved - MRI negative for infarct      Parkinson disease  - cont Sinemet     CAD (coronary artery disease) -cont ASA, Plavix and Crestor  BPH - Flomax  Vit B 12 deficiency - cont supplements- B12 level is 200-  given 1 s/c injection of 1000 mcg on 5/13  Anxiety/depression - Buspar, Cymbalta  GERD?  - cont Pepcid BID  Thrombocytopenia - appears chronic per labs    Time spent in minutes: 35 DVT prophylaxis: enoxaparin (LOVENOX) injection 40 mg Start: 12/29/20 1000  Code Status: full code Family Communication:  Level of Care: Level of care: Telemetry Cardiac Disposition Plan:  Status is: Inpatient  Remains inpatient appropriate because:Inpatient level of care appropriate due to severity of illness   Dispo: The patient is from: Home              Anticipated d/c is to: home              Patient currently is not medically stable to d/c.   Difficult to place patient No      Consultants:   Cardiology  Neurology  Procedures:    Antimicrobials:  Anti-infectives (From admission, onward)   None       Objective: Vitals:   12/31/20 0838 12/31/20 1105 12/31/20 1947 01/01/21 0427  BP: 114/72 107/75 (!) 128/91 108/67  Pulse: 86 73 83 73  Resp:  18 16 16   Temp:  97.9 F (36.6 C) 98.6 F (37 C) 98.3 F (36.8 C)  TempSrc:  Oral Oral Oral  SpO2:  92% 91% 93%  Weight:    100.8 kg  Height:        Intake/Output Summary (Last 24 hours) at 01/01/2021 1030 Last data filed at 01/01/2021 0800 Gross per 24 hour  Intake 717 ml  Output 1350 ml  Net -633  ml   Filed Weights   12/30/20 0645 12/31/20 0435 01/01/21 0427  Weight: 102.1 kg 101.2 kg 100.8 kg    Examination: General exam: Appears comfortable  HEENT: PERRLA, oral mucosa moist, no sclera icterus or thrush Respiratory system: Clear to auscultation. Respiratory effort normal. Cardiovascular system: S1 & S2 heard, regular rate and rhythm Gastrointestinal system: Abdomen soft, non-tender, nondistended. Normal bowel sounds   Central nervous system: Alert and oriented. No focal neurological deficits. Extremities: No cyanosis, clubbing or edema Skin: No rashes or ulcers Psychiatry:  Mood & affect appropriate.     Data Reviewed: I have personally reviewed following labs and imaging studies  CBC: Recent Labs  Lab 12/28/20 2159 12/30/20 0246  WBC 5.4 7.8  NEUTROABS 3.6  --   HGB 13.0 15.6  HCT 39.5 45.9  MCV 101.0* 97.9  PLT 138* 156   Basic Metabolic Panel: Recent Labs  Lab 12/28/20 2159 12/30/20 0246 12/31/20 0739  NA 137 138 138  K 3.9 3.5 3.6  CL 104 101 99  CO2 27 28 30   GLUCOSE 102* 105* 123*  BUN 12 13 13   CREATININE 0.75 0.85 0.96  CALCIUM 8.6* 8.9 8.8*  MG  --  1.9  --    GFR: Estimated Creatinine Clearance: 89.4 mL/min (by C-G formula based on SCr of 0.96 mg/dL). Liver Function Tests: Recent Labs  Lab 12/28/20 2159  AST 15  ALT 9  ALKPHOS 58  BILITOT 0.8  PROT 5.2*  ALBUMIN 3.1*   No results for input(s): LIPASE, AMYLASE in the last 168 hours. No results for input(s): AMMONIA in the last 168 hours. Coagulation Profile: No results for input(s): INR, PROTIME in the last 168 hours. Cardiac Enzymes: No results for input(s): CKTOTAL, CKMB, CKMBINDEX, TROPONINI in the last 168 hours. BNP (last 3 results) No results for input(s): PROBNP in the last 8760 hours. HbA1C: No results for input(s): HGBA1C in the last 72 hours. CBG: Recent Labs  Lab 12/30/20 0509  GLUCAP 123*   Lipid Profile: No results for input(s): CHOL, HDL, LDLCALC, TRIG,  CHOLHDL, LDLDIRECT in the last 72 hours. Thyroid Function Tests: No results for input(s): TSH, T4TOTAL, FREET4, T3FREE, THYROIDAB in the last 72 hours. Anemia Panel: Recent Labs    12/30/20 0246  VITAMINB12 200   Urine analysis:    Component Value Date/Time   COLORURINE YELLOW 10/03/2020 1514   APPEARANCEUR CLEAR 10/03/2020 1514   LABSPEC 1.006 10/03/2020 1514   PHURINE 6.0 10/03/2020 1514   GLUCOSEU NEGATIVE 10/03/2020 1514   HGBUR NEGATIVE 10/03/2020 1514   BILIRUBINUR NEGATIVE 10/03/2020 1514   KETONESUR NEGATIVE 10/03/2020 1514   PROTEINUR NEGATIVE 10/03/2020 1514   NITRITE NEGATIVE 10/03/2020 1514   LEUKOCYTESUR TRACE (A) 10/03/2020 1514   Sepsis Labs: @LABRCNTIP (procalcitonin:4,lacticidven:4) ) Recent Results (from the past 240 hour(s))  Resp Panel  by RT-PCR (Flu A&B, Covid) Nasopharyngeal Swab     Status: None   Collection Time: 12/28/20 10:00 PM   Specimen: Nasopharyngeal Swab; Nasopharyngeal(NP) swabs in vial transport medium  Result Value Ref Range Status   SARS Coronavirus 2 by RT PCR NEGATIVE NEGATIVE Final    Comment: (NOTE) SARS-CoV-2 target nucleic acids are NOT DETECTED.  The SARS-CoV-2 RNA is generally detectable in upper respiratory specimens during the acute phase of infection. The lowest concentration of SARS-CoV-2 viral copies this assay can detect is 138 copies/mL. A negative result does not preclude SARS-Cov-2 infection and should not be used as the sole basis for treatment or other patient management decisions. A negative result may occur with  improper specimen collection/handling, submission of specimen other than nasopharyngeal swab, presence of viral mutation(s) within the areas targeted by this assay, and inadequate number of viral copies(<138 copies/mL). A negative result must be combined with clinical observations, patient history, and epidemiological information. The expected result is Negative.  Fact Sheet for Patients:   BloggerCourse.com  Fact Sheet for Healthcare Providers:  SeriousBroker.it  This test is no t yet approved or cleared by the [place] FDA and  has been authorized for detection and/or diagnosis of SARS-CoV-2 by FDA under an Emergency Use Authorization (EUA). This EUA will remain  in effect (meaning this test can be used) for the duration of the COVID-19 declaration under Section 564(b)(1) of the Act, 21 U.S.C.section 360bbb-3(b)(1), unless the authorization is terminated  or revoked sooner.       Influenza A by PCR NEGATIVE NEGATIVE Final   Influenza B by PCR NEGATIVE NEGATIVE Final    Comment: (NOTE) The Xpert Xpress SARS-CoV-2/FLU/RSV plus assay is intended as an aid in the diagnosis of influenza from Nasopharyngeal swab specimens and should not be used as a sole basis for treatment. Nasal washings and aspirates are unacceptable for Xpert Xpress SARS-CoV-2/FLU/RSV testing.  Fact Sheet for Patients: BloggerCourse.com  Fact Sheet for Healthcare Providers: SeriousBroker.it  This test is not yet approved or cleared by the [place] FDA and has been authorized for detection and/or diagnosis of SARS-CoV-2 by FDA under an Emergency Use Authorization (EUA). This EUA will remain in effect (meaning this test can be used) for the duration of the COVID-19 declaration under Section 564(b)(1) of the Act, 21 U.S.C. section 360bbb-3(b)(1), unless the authorization is terminated or revoked.  Performed at [facility], [address] [place], [place] [address]          Radiology Studies: MR BRAIN WO CONTRAST  Result Date: 12/30/2020 CLINICAL DATA:  Code stroke follow-up, left-sided weakness EXAM: MRI HEAD WITHOUT CONTRAST TECHNIQUE: Multiplanar, multiecho pulse sequences of the brain and surrounding structures were obtained without intravenous contrast. COMPARISON:   03/31/2020 FINDINGS: Brain: There is no acute infarction or intracranial hemorrhage. There is no intracranial mass, mass effect, or edema. There is no hydrocephalus or extra-axial fluid collection. Prominence of the ventricles and sulci reflects generalized parenchymal volume loss. Minimal patchy T2 hyperintensity in the supratentorial and pontine white matter is nonspecific but may reflect minor chronic microvascular ischemic changes. Vascular: Major vessel flow voids at the skull base are preserved. Skull and upper cervical spine: Normal marrow signal is preserved. Sinuses/Orbits: Mild lobular maxillary sinus mucosal thickening. Orbits are unremarkable. Other: Sella is unremarkable.  Mastoid air cells are clear. IMPRESSION: No evidence of recent infarction, hemorrhage, or mass. Minor chronic microvascular ischemic changes. Electronically Signed   By: Guadlupe Spanish M.D.   On: 12/30/2020  14:01   ECHOCARDIOGRAM COMPLETE  Result Date: 12/31/2020    ECHOCARDIOGRAM REPORT   Patient Name:   Merwyn KatosRICHARD E Hiser Date of Exam: 12/31/2020 Medical Rec #:  [id number]         Height:       73.0 in Accession #:    [id number]        Weight:       223.1 lb Date of Birth:  02-16-1951         BSA:          2.254 m Patient Age:    70 years          BP:           107/75 mmHg Patient Gender: M                 HR:           73 bpm. Exam Location:  Inpatient Procedure: 2D Echo, Cardiac Doppler and Color Doppler Indications:    Ventricular Tachycardia I47.2  History:        Patient has prior history of Echocardiogram examinations. CHF,                 CAD; Risk Factors:Hypertension. Parkinson's.  Sonographer:    Celesta GentileBernard White RCS Referring Phys: [id number] ANGELA NICOLE DUKE IMPRESSIONS  1. Left ventricular ejection fraction, by estimation, is 60 to 65%. The left ventricle has normal function. The left ventricle has no regional wall motion abnormalities. There is mild concentric left ventricular hypertrophy. Left ventricular diastolic  parameters are consistent with Grade I diastolic dysfunction (impaired relaxation).  2. Right ventricular systolic function is normal. The right ventricular size is normal. Tricuspid regurgitation signal is inadequate for assessing PA pressure.  3. The mitral valve is normal in structure. No evidence of mitral valve regurgitation. No evidence of mitral stenosis.  4. The aortic valve is tricuspid. Aortic valve regurgitation is mild. Mild to moderate aortic valve sclerosis/calcification is present, without any evidence of aortic stenosis. Aortic regurgitation PHT measures 536 msec.  5. Aortic dilatation noted. There is mild dilatation of the aortic root, measuring 38 mm. FINDINGS  Left Ventricle: Left ventricular ejection fraction, by estimation, is 60 to 65%. The left ventricle has normal function. The left ventricle has no regional wall motion abnormalities. The left ventricular internal cavity size was normal in size. There is  mild concentric left ventricular hypertrophy. Left ventricular diastolic parameters are consistent with Grade I diastolic dysfunction (impaired relaxation). Normal left ventricular filling pressure. Right Ventricle: The right ventricular size is normal. No increase in right ventricular wall thickness. Right ventricular systolic function is normal. Tricuspid regurgitation signal is inadequate for assessing PA pressure. Left Atrium: Left atrial size was normal in size. Right Atrium: Right atrial size was normal in size. Pericardium: There is no evidence of pericardial effusion. Mitral Valve: The mitral valve is normal in structure. No evidence of mitral valve regurgitation. No evidence of mitral valve stenosis. Tricuspid Valve: The tricuspid valve is normal in structure. Tricuspid valve regurgitation is not demonstrated. No evidence of tricuspid stenosis. Aortic Valve: The aortic valve is tricuspid. Aortic valve regurgitation is mild. Aortic regurgitation PHT measures 536 msec. Mild to  moderate aortic valve sclerosis/calcification is present, without any evidence of aortic stenosis. Pulmonic Valve: The pulmonic valve was normal in structure. Pulmonic valve regurgitation is mild. No evidence of pulmonic stenosis. Aorta: Aortic dilatation noted. There is mild dilatation of the aortic root, measuring 38 mm. IAS/Shunts: No atrial level  shunt detected by color flow Doppler.  LEFT VENTRICLE PLAX 2D LVIDd:         4.60 cm  Diastology LVIDs:         2.50 cm  LV e' medial:    5.77 cm/s LV PW:         1.10 cm  LV E/e' medial:  10.4 LV IVS:        1.15 cm  LV e' lateral:   8.59 cm/s LVOT diam:     2.00 cm  LV E/e' lateral: 7.0 LV SV:         62 LV SV Index:   27 LVOT Area:     3.14 cm  RIGHT VENTRICLE RV S prime:     17.10 cm/s TAPSE (M-mode): 1.9 cm LEFT ATRIUM             Index LA diam:        4.25 cm 1.89 cm/m LA Vol (A2C):   56.4 ml 25.02 ml/m LA Vol (A4C):   35.0 ml 15.53 ml/m LA Biplane Vol: 44.5 ml 19.74 ml/m  AORTIC VALVE LVOT Vmax:   101.00 cm/s LVOT Vmean:  74.000 cm/s LVOT VTI:    0.197 m AI PHT:      536 msec  AORTA Ao Root diam: 4.00 cm MITRAL VALVE MV Area (PHT): 2.99 cm    SHUNTS MV Decel Time: 254 msec    Systemic VTI:  0.20 m MV E velocity: 60.00 cm/s  Systemic Diam: 2.00 cm MV A velocity: 73.00 cm/s MV E/A ratio:  0.82 Armanda Magic MD Electronically signed by Armanda Magic MD Signature Date/Time: 12/31/2020/6:25:20 PM    Final       Scheduled Meds: . aspirin  81 mg Oral Daily  . busPIRone  10 mg Oral TID  . carbidopa-levodopa  2.5 tablet Oral TID  . cholecalciferol  1,000 Units Oral Daily  . clopidogrel  75 mg Oral Daily  . DULoxetine  30 mg Oral BID  . enoxaparin (LOVENOX) injection  40 mg Subcutaneous Q24H  . famotidine  20 mg Oral BID  . gabapentin  300 mg Oral TID  . magnesium oxide  400 mg Oral BID  . metoprolol tartrate  25 mg Oral BID  . nortriptyline  10 mg Oral QHS  . pregabalin  75 mg Oral BID  . rosuvastatin  40 mg Oral QHS  . tamsulosin  0.4 mg Oral QHS   . thiamine  100 mg Oral Daily  . vitamin B-12  1,000 mcg Oral Daily   Continuous Infusions:   LOS: 3 days      Calvert Cantor, MD [facility] Pager: [email or web address] 01/01/2021, 10:30 AM

## 2021-01-01 NOTE — Progress Notes (Signed)
Call received from Nuc Med. PIV infiltrated during first injection for rest images. Nuc med tech reached out to radiology who recommended waiting 24 hr to re-image. I will place new NST order for tomorrow. NPO at MN please.

## 2021-01-01 NOTE — Progress Notes (Signed)
Patient to nuclear medicine for stress test. Patient was injected with myoview through PIV in the right lower forearm. Infiltration occurred during exam. Site is swollen however no pain per patient. IV removed and heat applied to extremity. PIV obtained in the left [facility]. Report called to zara RN. Patient will return tomorrow for scan per radiology physician recommendation Dr. Bradly Chris. Patient can eat however NPO after midnight.

## 2021-01-02 ENCOUNTER — Encounter (HOSPITAL_COMMUNITY): Payer: Self-pay | Admitting: Internal Medicine

## 2021-01-02 ENCOUNTER — Inpatient Hospital Stay (HOSPITAL_COMMUNITY): Payer: Medicare Other

## 2021-01-02 DIAGNOSIS — I472 Ventricular tachycardia, unspecified: Secondary | ICD-10-CM

## 2021-01-02 LAB — NM MYOCAR MULTI W/SPECT W/WALL MOTION / EF
Estimated workload: 1 METS
Exercise duration (min): 0 min
Exercise duration (sec): 0 s
LV dias vol: 109 mL (ref 62–150)
LV sys vol: 49 mL
MPHR: 150 {beats}/min
Peak HR: 91 {beats}/min
Percent HR: 60 %
RPE: 0
Rest HR: 67 {beats}/min
TID: 0.5

## 2021-01-02 MED ORDER — REGADENOSON 0.4 MG/5ML IV SOLN
INTRAVENOUS | Status: AC
  Administered 2021-01-02: 0.4 mg via INTRAVENOUS
  Filled 2021-01-02: qty 5

## 2021-01-02 MED ORDER — TECHNETIUM TC 99M TETROFOSMIN IV KIT
32.4000 | PACK | Freq: Once | INTRAVENOUS | Status: AC | PRN
  Administered 2021-01-02: 32.4 via INTRAVENOUS

## 2021-01-02 MED ORDER — TECHNETIUM TC 99M TETROFOSMIN IV KIT
10.0000 | PACK | Freq: Once | INTRAVENOUS | Status: AC | PRN
  Administered 2021-01-02: 10 via INTRAVENOUS

## 2021-01-02 MED ORDER — METOPROLOL TARTRATE 25 MG PO TABS: 25.0000 mg | ORAL_TABLET | Freq: Two times a day (BID) | ORAL | 0 refills | Status: DC

## 2021-01-02 MED ORDER — REGADENOSON 0.4 MG/5ML IV SOLN
0.4000 mg | Freq: Once | INTRAVENOUS | Status: AC
  Filled 2021-01-02: qty 5

## 2021-01-02 NOTE — Discharge Summary (Signed)
Physician Discharge Summary  [name] GNF:[id number] DOB: 1951-06-19 DOA: 12/28/2020  PCP: [facility]  Admit date: 12/28/2020 Discharge date: 01/02/2021  Admitted From: home  Disposition:  home  Recommendations for Outpatient Follow-up:  1. F/u on shortness of breath (at [facility]) 2. Please follow up on electrolytes (K and Mg)  Home Health:  none Discharge Condition:  stable   CODE STATUS:  Full code   Diet recommendation:  Heart healthy Consultations:  cardiology  Procedures/Studies: . Nuclear stress test   Discharge Diagnoses:  Principal Problem:   Acute respiratory failure (HCC) Active Problems:   Hypertension   Parkinson disease (HCC)   CAD (coronary artery disease)     Brief Summary: Raymond White is a 70 y.o.malewith medical history significant ofchronic diastolic CHF, CAD, hypertension, hyperlipidemia, Parkinson's disease, CVA, chronic back pain,COVID-19viral pneumonia in February 2022 presented to the ED via EMS for evaluation of dyspnea, bilateral lower extremity edema, and weight gain. He also describes an episode while standing at the sink where he felt suddenly fatigued and needed to sleep. This feeling was associated with some shortness of breath and resolved quickly.  Weight increased from 200 to about 238 lb lately.   In the ED >  Weight 224, initial ambulatory pulse ox 97% with dyspnea on exertion, repeat ambulatory pulse oxs x 2 separate walks the following AM 82 and then 89%.   5/12 night> 34 beat run of V tach- he states he had right sided pain, points to area below right rib case around the same time- not long after, developed left sided numbness, headache and visual changes. Code stroke called- suspected to be complicated Migraine and given IV Reglan. Symptoms resolved.  Hospital Course:  Principal Problem:   Acute respiratory failure with hypoxia - suspecting acute CHF and diuresed with IV Lasix- takes 80 mg oral Lasix at  home daily- he cannot remember if he takes Spironolactone but it's on his med list - prior ECHO showed normal EF and grade 1 d CHF but may have greater diastolic dysfunction than seen - CT chest > Overall improvement in the peripheral ground-glass opacities from prior CT. Faint subpleural reticulation in the lingula and left lower lobe may represent post infectious or inflammatory scarring or potentially interstitial lung disease - ? If interstitial changes residual from COVID PNA - switched to oral Lasix yesterday- continue home dose with Spironolactone- educated on limiting oral fluid intake  Active Problems: V tach overnight 5/12 - associated with right lower chest pain - IV Metoprolol given- oral Metoprolol started -  I have consulted cardiology - they recommend a stress tes & repeating ECHO - K was 3.5, Mg 1.9 - resolved after Lopressor 25 BID -He underwent a stress test which is negative per verbal report from cardiology  Headache with left sided numbness and visual changes 5/12 - occurred soon after v tach episode - code stroke called- initial CT head negative - ? Complicated migraine- Reglan given- symptoms resolved - MRI negative for infarct     Parkinson disease  - cont Sinemet  CAD (coronary artery disease) -cont ASA, Plavix and Crestor  BPH - Flomax  Vit B 12 deficiency - cont supplements- B12 level is 200-  given 1 s/c injection of 1000 mcg on 5/13  Anxiety/depression - Buspar, Cymbalta  GERD?  - cont Pepcid BID  Thrombocytopenia - appears chronic per labs     Discharge Exam: Vitals:   01/02/21 1231 01/02/21 1232  BP: 109/79   Pulse:  80 80  Resp: 16   Temp: 98 F (36.7 C)   SpO2: 96%    Vitals:   01/02/21 1027 01/02/21 1029 01/02/21 1231 01/02/21 1232  BP: 109/61 106/63 109/79   Pulse:   80 80  Resp:   16   Temp:   98 F (36.7 C)   TempSrc:   Oral   SpO2:   96%   Weight:      Height:        General: Pt is alert, awake,  not in acute distress Cardiovascular: RRR, S1/S2 +, no rubs, no gallops Respiratory: CTA bilaterally, no wheezing, no rhonchi Abdominal: Soft, NT, ND, bowel sounds + Extremities: no edema, no cyanosis   Discharge Instructions  Discharge Instructions    Diet - low sodium heart healthy   Complete by: As directed    Increase activity slowly   Complete by: As directed      Allergies as of 01/02/2021      Reactions   Fish-derived Products Anaphylaxis   Zoloft [sertraline Hcl] Rash   Sulfa Antibiotics Rash, Itching, Hives   Other reaction(s): Itching of eye, Eruption   Sulfamethizole Rash      Medication List    STOP taking these medications   albuterol 108 (90 Base) MCG/ACT inhaler Commonly known as: VENTOLIN HFA     TAKE these medications   acetaminophen 500 MG tablet Commonly known as: TYLENOL Take 500 mg by mouth every 6 (six) hours as needed for moderate pain.   aspirin 81 MG chewable tablet Chew 81 mg by mouth daily.   Biofreeze 4 % Gel Generic drug: Menthol (Topical Analgesic) Apply 1 g topically 4 (four) times daily as needed (pain).   busPIRone 10 MG tablet Commonly known as: BUSPAR Take 1 tablet (10 mg total) by mouth 3 (three) times daily.   carbidopa-levodopa 25-100 MG tablet Commonly known as: SINEMET IR Take 2.5 tablets by mouth 3 (three) times daily.   cholecalciferol 25 MCG (1000 UNIT) tablet Commonly known as: VITAMIN D3 Take 1,000 Units by mouth daily.   clopidogrel 75 MG tablet Commonly known as: PLAVIX Take 75 mg by mouth daily.   diphenoxylate-atropine 2.5-0.025 MG tablet Commonly known as: LOMOTIL Take 1 tablet by mouth 3 (three) times daily as needed for diarrhea or loose stools.   DULoxetine 30 MG capsule Commonly known as: CYMBALTA Take 30 mg by mouth 2 (two) times daily.   EPINEPHrine 0.3 mg/0.3 mL Soaj injection Commonly known as: EPI-PEN Inject 0.3 mg into the muscle daily as needed for anaphylaxis.   famotidine 20 MG  tablet Commonly known as: PEPCID Take 1 tablet (20 mg total) by mouth in the morning and at bedtime.   Fluticasone-Salmeterol 250-50 MCG/DOSE Aepb Commonly known as: ADVAIR Inhale 1 puff into the lungs 2 (two) times daily.   furosemide 80 MG tablet Commonly known as: LASIX Take 80 mg by mouth daily.   gabapentin 300 MG capsule Commonly known as: NEURONTIN Take 300 mg by mouth 3 (three) times daily.   magnesium oxide 400 MG tablet Commonly known as: MAG-OX Take 400 mg by mouth 2 (two) times daily.   metoprolol tartrate 25 MG tablet Commonly known as: LOPRESSOR Take 1 tablet (25 mg total) by mouth 2 (two) times daily.   nortriptyline 10 MG capsule Commonly known as: PAMELOR Take 10 mg by mouth at bedtime.   omeprazole 20 MG capsule Commonly known as: PRILOSEC Take 20 mg by mouth daily as needed (heartburn/reflux).   potassium  chloride SA 20 MEQ tablet Commonly known as: KLOR-CON Take 20 mEq by mouth daily. With food   pregabalin 75 MG capsule Commonly known as: LYRICA Take 75 mg by mouth 2 (two) times daily.   rosuvastatin 40 MG tablet Commonly known as: CRESTOR Take 40 mg by mouth at bedtime.   spironolactone 25 MG tablet Commonly known as: ALDACTONE Take 1 tablet (25 mg total) by mouth daily.   tamsulosin 0.4 MG Caps capsule Commonly known as: FLOMAX Take 0.4 mg by mouth at bedtime. Hold Dose For Systolic Pressure<100   thiamine 100 MG tablet Commonly known as: Vitamin B-1 Take 1 tablet (100 mg total) by mouth daily.   vitamin B-12 500 MCG tablet Commonly known as: CYANOCOBALAMIN Take 1,000 mcg by mouth daily.       Allergies  Allergen Reactions  . Fish-Derived Products Anaphylaxis  . Zoloft [Sertraline Hcl] Rash  . Sulfa Antibiotics Rash, Itching and Hives    Other reaction(s): Itching of eye, Eruption  . Sulfamethizole Rash      DG Chest 2 View  Result Date: 12/29/2020 CLINICAL DATA:  Habits shortness of breath and RIGHT-sided chest pain.  History of pulmonary and diaphragmatic surgery in the past. EXAM: CHEST - 2 VIEW COMPARISON:  Dec 28, 2020 and September 23, 2020. FINDINGS: EKG leads project over the chest. Trachea is midline. Cardiomediastinal contours and hilar structures are stable. Lung volumes remain diminished. Colonic interposition beneath the RIGHT hemidiaphragm is noted as on previous imaging. No consolidation or sign of pleural effusion. Basilar scarring over the RIGHT lung base is similar to the prior exam. On limited assessment no acute skeletal process. Signs of prior thoracotomy in the RIGHT chest. IMPRESSION: Stable appearance of the chest.  No acute cardiopulmonary disease. Electronically Signed   By: Donzetta Kohut M.D.   On: 12/29/2020 14:27   CT CHEST WO CONTRAST  Result Date: 12/29/2020 CLINICAL DATA:  Respiratory failure. EXAM: CT CHEST WITHOUT CONTRAST TECHNIQUE: Multidetector CT imaging of the chest was performed following the standard protocol without IV contrast. COMPARISON:  Recent radiographs.  Most recent chest CT 09/23/2020 FINDINGS: Cardiovascular: Normal caliber thoracic aorta with mild atherosclerosis. Normal heart size with coronary artery calcifications. No pericardial effusion. Mediastinum/Nodes: Scattered small mediastinal lymph nodes not enlarged by size criteria. Limited assessment for hilar adenopathy on this noncontrast exam. Small hiatal hernia with postsurgical change of the gastroesophageal junction. No visualized thyroid nodule. Lungs/Pleura: Unchanged elevation of the right hemidiaphragm with right basilar pleural thickening calcifications, query prior talc pleurodesis. Overall no significant change from prior exam. Similar right basilar scarring. Overall improvement in the peripheral ground-glass opacities on prior CT. Faint subpleural reticulation noted in the lingula and left lower lobe. Minimal subsegmental atelectasis or scarring in the anterior right upper lobe. No dominant pulmonary nodule or  suspicious mass. Trachea and central bronchi are patent. No significant pleural effusion. Upper Abdomen: No acute findings. Cholecystectomy. Colonic interposition under the right hemidiaphragm. Musculoskeletal: Straightening of normal thoracic kyphosis. Multilevel degenerative change. Remote postsurgical or posttraumatic change of right posterior ribs. No acute osseous findings. IMPRESSION: 1. Overall improvement in the peripheral ground-glass opacities from prior CT. Faint subpleural reticulation in the lingula and left lower lobe may represent post infectious or inflammatory scarring or potentially interstitial lung disease. 2. Unchanged elevation of the right hemidiaphragm with right basilar scarring, pleural thickening and calcifications, query prior talc pleurodesis. 3. Coronary artery calcifications.  Aortic atherosclerosis. Aortic Atherosclerosis (ICD10-I70.0). Electronically Signed   By: Ivette Loyal.D.  On: 12/29/2020 21:40   MR BRAIN WO CONTRAST  Result Date: 12/30/2020 CLINICAL DATA:  Code stroke follow-up, left-sided weakness EXAM: MRI HEAD WITHOUT CONTRAST TECHNIQUE: Multiplanar, multiecho pulse sequences of the brain and surrounding structures were obtained without intravenous contrast. COMPARISON:  03/31/2020 FINDINGS: Brain: There is no acute infarction or intracranial hemorrhage. There is no intracranial mass, mass effect, or edema. There is no hydrocephalus or extra-axial fluid collection. Prominence of the ventricles and sulci reflects generalized parenchymal volume loss. Minimal patchy T2 hyperintensity in the supratentorial and pontine white matter is nonspecific but may reflect minor chronic microvascular ischemic changes. Vascular: Major vessel flow voids at the skull base are preserved. Skull and upper cervical spine: Normal marrow signal is preserved. Sinuses/Orbits: Mild lobular maxillary sinus mucosal thickening. Orbits are unremarkable. Other: Sella is unremarkable.  Mastoid  air cells are clear. IMPRESSION: No evidence of recent infarction, hemorrhage, or mass. Minor chronic microvascular ischemic changes. Electronically Signed   By: Guadlupe Spanish M.D.   On: 12/30/2020 14:01   NM Myocar Multi W/Spect W/Wall Motion / EF  Result Date: 01/02/2021  There was no ST segment deviation noted during stress.  No T wave inversion was noted during stress.  There is normal myocardial perfusion with no evidence of ischemia or infarction.  This is a low risk study.  The left ventricular ejection fraction is normal (55-65%).  Laurance Flatten, MD   DG Chest Port 1 View  Result Date: 12/28/2020 CLINICAL DATA:  Shortness of breath EXAM: PORTABLE CHEST 1 VIEW COMPARISON:  09/24/2020 FINDINGS: Low lung volumes. The left lung is grossly clear. Possible small right effusion with right basilar atelectasis or mild pneumonia. Aortic atherosclerosis. No pneumothorax. IMPRESSION: 1. Cardiomegaly 2. Possible small right effusion with atelectasis or mild pneumonia right base Electronically Signed   By: Jasmine Pang M.D.   On: 12/28/2020 22:13   ECHOCARDIOGRAM COMPLETE  Result Date: 12/31/2020    ECHOCARDIOGRAM REPORT   Patient Name:   Raymond White Date of Exam: 12/31/2020 Medical Rec #:  [id number]         Height:       73.0 in Accession #:    [id number]        Weight:       223.1 lb Date of Birth:  August 24, 1950         BSA:          2.254 m Patient Age:    70 years          BP:           107/75 mmHg Patient Gender: M                 HR:           73 bpm. Exam Location:  Inpatient Procedure: 2D Echo, Cardiac Doppler and Color Doppler Indications:    Ventricular Tachycardia I47.2  History:        Patient has prior history of Echocardiogram examinations. CHF,                 CAD; Risk Factors:Hypertension. Parkinson's.  Sonographer:    Celesta Gentile RCS Referring Phys: [id number] ANGELA NICOLE DUKE IMPRESSIONS  1. Left ventricular ejection fraction, by estimation, is 60 to 65%. The left ventricle  has normal function. The left ventricle has no regional wall motion abnormalities. There is mild concentric left ventricular hypertrophy. Left ventricular diastolic parameters are consistent with Grade I diastolic dysfunction (impaired relaxation).  2. Right ventricular systolic function  is normal. The right ventricular size is normal. Tricuspid regurgitation signal is inadequate for assessing PA pressure.  3. The mitral valve is normal in structure. No evidence of mitral valve regurgitation. No evidence of mitral stenosis.  4. The aortic valve is tricuspid. Aortic valve regurgitation is mild. Mild to moderate aortic valve sclerosis/calcification is present, without any evidence of aortic stenosis. Aortic regurgitation PHT measures 536 msec.  5. Aortic dilatation noted. There is mild dilatation of the aortic root, measuring 38 mm. FINDINGS  Left Ventricle: Left ventricular ejection fraction, by estimation, is 60 to 65%. The left ventricle has normal function. The left ventricle has no regional wall motion abnormalities. The left ventricular internal cavity size was normal in size. There is  mild concentric left ventricular hypertrophy. Left ventricular diastolic parameters are consistent with Grade I diastolic dysfunction (impaired relaxation). Normal left ventricular filling pressure. Right Ventricle: The right ventricular size is normal. No increase in right ventricular wall thickness. Right ventricular systolic function is normal. Tricuspid regurgitation signal is inadequate for assessing PA pressure. Left Atrium: Left atrial size was normal in size. Right Atrium: Right atrial size was normal in size. Pericardium: There is no evidence of pericardial effusion. Mitral Valve: The mitral valve is normal in structure. No evidence of mitral valve regurgitation. No evidence of mitral valve stenosis. Tricuspid Valve: The tricuspid valve is normal in structure. Tricuspid valve regurgitation is not demonstrated. No  evidence of tricuspid stenosis. Aortic Valve: The aortic valve is tricuspid. Aortic valve regurgitation is mild. Aortic regurgitation PHT measures 536 msec. Mild to moderate aortic valve sclerosis/calcification is present, without any evidence of aortic stenosis. Pulmonic Valve: The pulmonic valve was normal in structure. Pulmonic valve regurgitation is mild. No evidence of pulmonic stenosis. Aorta: Aortic dilatation noted. There is mild dilatation of the aortic root, measuring 38 mm. IAS/Shunts: No atrial level shunt detected by color flow Doppler.  LEFT VENTRICLE PLAX 2D LVIDd:         4.60 cm  Diastology LVIDs:         2.50 cm  LV e' medial:    5.77 cm/s LV PW:         1.10 cm  LV E/e' medial:  10.4 LV IVS:        1.15 cm  LV e' lateral:   8.59 cm/s LVOT diam:     2.00 cm  LV E/e' lateral: 7.0 LV SV:         62 LV SV Index:   27 LVOT Area:     3.14 cm  RIGHT VENTRICLE RV S prime:     17.10 cm/s TAPSE (M-mode): 1.9 cm LEFT ATRIUM             Index LA diam:        4.25 cm 1.89 cm/m LA Vol (A2C):   56.4 ml 25.02 ml/m LA Vol (A4C):   35.0 ml 15.53 ml/m LA Biplane Vol: 44.5 ml 19.74 ml/m  AORTIC VALVE LVOT Vmax:   101.00 cm/s LVOT Vmean:  74.000 cm/s LVOT VTI:    0.197 m AI PHT:      536 msec  AORTA Ao Root diam: 4.00 cm MITRAL VALVE MV Area (PHT): 2.99 cm    SHUNTS MV Decel Time: 254 msec    Systemic VTI:  0.20 m MV E velocity: 60.00 cm/s  Systemic Diam: 2.00 cm MV A velocity: 73.00 cm/s MV E/A ratio:  0.82 Armanda Magic MD Electronically signed by Armanda Magic MD Signature Date/Time: 12/31/2020/6:25:20 PM  Final    CT HEAD CODE STROKE WO CONTRAST  Result Date: 12/30/2020 CLINICAL DATA:  Code stroke. Left-sided weakness with aphasia and neglect. EXAM: CT HEAD WITHOUT CONTRAST TECHNIQUE: Contiguous axial images were obtained from the base of the skull through the vertex without intravenous contrast. COMPARISON:  09/23/2020 FINDINGS: Brain: No evidence of acute infarction, hemorrhage, hydrocephalus,  extra-axial collection or mass lesion/mass effect. Cerebral volume loss without specific pattern. Vascular: No hyperdense vessel or unexpected calcification. Skull: Normal. Negative for fracture or focal lesion. Sinuses/Orbits: No acute finding. Other: These results were called by telephone at the time of interpretation on 12/30/2020 at 5:37 am to provider MCNEILL [facility] , who verbally acknowledged these results. ASPECTS [facility] Stroke Program Early CT Score) - Ganglionic level infarction (caudate, lentiform nuclei, internal capsule, insula, M1-M3 cortex): 7 - Supraganglionic infarction (M4-M6 cortex): 3 Total score (0-10 with 10 being normal): 10 IMPRESSION: No acute finding.  Aspects is 10. Electronically Signed   By: Marnee Spring M.D.   On: 12/30/2020 05:38   CT ANGIO HEAD NECK W WO CM W PERF (CODE STROKE)  Addendum Date: 12/30/2020   ADDENDUM REPORT: 12/30/2020 06:10 ADDENDUM: CT perfusion was performed using rapid software after bolus administration of iodinated contrast. No penumbra or infarct detected. Electronically Signed   By: Marnee Spring M.D.   On: 12/30/2020 06:10   Result Date: 12/30/2020 CLINICAL DATA:  Stroke follow-up EXAM: CT ANGIOGRAPHY HEAD AND NECK TECHNIQUE: Multidetector CT imaging of the head and neck was performed using the standard protocol during bolus administration of intravenous contrast. Multiplanar CT image reconstructions and MIPs were obtained to evaluate the vascular anatomy. Carotid stenosis measurements (when applicable) are obtained utilizing NASCET criteria, using the distal internal carotid diameter as the denominator. CONTRAST:  Dose is currently not known on this in progress study COMPARISON:  Head CT from earlier the same day FINDINGS: CTA NECK FINDINGS Aortic arch: Limited coverage which is unremarkable. Right carotid system: No evidence of dissection, stenosis (50% or greater) or occlusion. Minimal bifurcation atheromatous changes Left carotid system: No  evidence of dissection, stenosis (50% or greater) or occlusion. Mild bifurcation atherosclerosis. Vertebral arteries: No proximal subclavian stenosis. Left dominant vertebral artery. Both vertebral arteries are smoothly contoured and widely patent to the dura. Skeleton: Generalized degenerative disease of the cervical spine. There is C4-C7 ACDF with solid arthrodesis. C2-3 facet ankylosis. Other neck: No acute finding Upper chest: Negative Review of the MIP images confirms the above findings CTA HEAD FINDINGS Anterior circulation: Notable lack of atheromatous changes for age. No branch occlusion, stenosis, aneurysm, or vascular malformation. Posterior circulation: Strong left vertebral artery dominance. The right vertebral artery essentially ends in the PICA. Diffusely patent basilar. Small basilar in the setting of fetal type PCA flow on the right more than left. Venous sinuses: Negative Anatomic variants: None significant Review of the MIP images confirms the above findings IMPRESSION: No emergent finding. Mild atherosclerosis without stenosis. Electronically Signed: By: Marnee Spring M.D. On: 12/30/2020 05:58     The results of significant diagnostics from this hospitalization (including imaging, microbiology, ancillary and laboratory) are listed below for reference.     Microbiology: Recent Results (from the past 240 hour(s))  Resp Panel by RT-PCR (Flu A&B, Covid) Nasopharyngeal Swab     Status: None   Collection Time: 12/28/20 10:00 PM   Specimen: Nasopharyngeal Swab; Nasopharyngeal(NP) swabs in vial transport medium  Result Value Ref Range Status   SARS Coronavirus 2 by RT PCR NEGATIVE NEGATIVE Final  Comment: (NOTE) SARS-CoV-2 target nucleic acids are NOT DETECTED.  The SARS-CoV-2 RNA is generally detectable in upper respiratory specimens during the acute phase of infection. The lowest concentration of SARS-CoV-2 viral copies this assay can detect is 138 copies/mL. A negative result  does not preclude SARS-Cov-2 infection and should not be used as the sole basis for treatment or other patient management decisions. A negative result may occur with  improper specimen collection/handling, submission of specimen other than nasopharyngeal swab, presence of viral mutation(s) within the areas targeted by this assay, and inadequate number of viral copies(<138 copies/mL). A negative result must be combined with clinical observations, patient history, and epidemiological information. The expected result is Negative.  Fact Sheet for Patients:  BloggerCourse.com  Fact Sheet for Healthcare Providers:  SeriousBroker.it  This test is no t yet approved or cleared by the [place] FDA and  has been authorized for detection and/or diagnosis of SARS-CoV-2 by FDA under an Emergency Use Authorization (EUA). This EUA will remain  in effect (meaning this test can be used) for the duration of the COVID-19 declaration under Section 564(b)(1) of the Act, 21 U.S.C.section 360bbb-3(b)(1), unless the authorization is terminated  or revoked sooner.       Influenza A by PCR NEGATIVE NEGATIVE Final   Influenza B by PCR NEGATIVE NEGATIVE Final    Comment: (NOTE) The Xpert Xpress SARS-CoV-2/FLU/RSV plus assay is intended as an aid in the diagnosis of influenza from Nasopharyngeal swab specimens and should not be used as a sole basis for treatment. Nasal washings and aspirates are unacceptable for Xpert Xpress SARS-CoV-2/FLU/RSV testing.  Fact Sheet for Patients: BloggerCourse.com  Fact Sheet for Healthcare Providers: SeriousBroker.it  This test is not yet approved or cleared by the [place] FDA and has been authorized for detection and/or diagnosis of SARS-CoV-2 by FDA under an Emergency Use Authorization (EUA). This EUA will remain in effect (meaning this test can be used) for  the duration of the COVID-19 declaration under Section 564(b)(1) of the Act, 21 U.S.C. section 360bbb-3(b)(1), unless the authorization is terminated or revoked.  Performed at [facility], [address] [place], [place] [address]      Labs: BNP (last 3 results) Recent Labs    08/26/20 1657 09/23/20 1639 12/28/20 2159  BNP 124.4* 95.2 198.3*   Basic Metabolic Panel: Recent Labs  Lab 12/28/20 2159 12/30/20 0246 12/31/20 0739  NA 137 138 138  K 3.9 3.5 3.6  CL 104 101 99  CO2 27 28 30   GLUCOSE 102* 105* 123*  BUN 12 13 13   CREATININE 0.75 0.85 0.96  CALCIUM 8.6* 8.9 8.8*  MG  --  1.9  --    Liver Function Tests: Recent Labs  Lab 12/28/20 2159  AST 15  ALT 9  ALKPHOS 58  BILITOT 0.8  PROT 5.2*  ALBUMIN 3.1*   No results for input(s): LIPASE, AMYLASE in the last 168 hours. No results for input(s): AMMONIA in the last 168 hours. CBC: Recent Labs  Lab 12/28/20 2159 12/30/20 0246  WBC 5.4 7.8  NEUTROABS 3.6  --   HGB 13.0 15.6  HCT 39.5 45.9  MCV 101.0* 97.9  PLT 138* 156   Cardiac Enzymes: No results for input(s): CKTOTAL, CKMB, CKMBINDEX, TROPONINI in the last 168 hours. BNP: Invalid input(s): POCBNP CBG: Recent Labs  Lab 12/30/20 0509  GLUCAP 123*   D-Dimer No results for input(s): DDIMER in the last 72 hours. Hgb A1c No results for input(s): HGBA1C in  the last 72 hours. Lipid Profile No results for input(s): CHOL, HDL, LDLCALC, TRIG, CHOLHDL, LDLDIRECT in the last 72 hours. Thyroid function studies No results for input(s): TSH, T4TOTAL, T3FREE, THYROIDAB in the last 72 hours.  Invalid input(s): FREET3 Anemia work up No results for input(s): VITAMINB12, FOLATE, FERRITIN, TIBC, IRON, RETICCTPCT in the last 72 hours. Urinalysis    Component Value Date/Time   COLORURINE YELLOW 10/03/2020 1514   APPEARANCEUR CLEAR 10/03/2020 1514   LABSPEC 1.006 10/03/2020 1514   PHURINE 6.0 10/03/2020 1514   GLUCOSEU NEGATIVE 10/03/2020 1514    HGBUR NEGATIVE 10/03/2020 1514   BILIRUBINUR NEGATIVE 10/03/2020 1514   KETONESUR NEGATIVE 10/03/2020 1514   PROTEINUR NEGATIVE 10/03/2020 1514   NITRITE NEGATIVE 10/03/2020 1514   LEUKOCYTESUR TRACE (A) 10/03/2020 1514   Sepsis Labs Invalid input(s): PROCALCITONIN,  WBC,  LACTICIDVEN Microbiology Recent Results (from the past 240 hour(s))  Resp Panel by RT-PCR (Flu A&B, Covid) Nasopharyngeal Swab     Status: None   Collection Time: 12/28/20 10:00 PM   Specimen: Nasopharyngeal Swab; Nasopharyngeal(NP) swabs in vial transport medium  Result Value Ref Range Status   SARS Coronavirus 2 by RT PCR NEGATIVE NEGATIVE Final    Comment: (NOTE) SARS-CoV-2 target nucleic acids are NOT DETECTED.  The SARS-CoV-2 RNA is generally detectable in upper respiratory specimens during the acute phase of infection. The lowest concentration of SARS-CoV-2 viral copies this assay can detect is 138 copies/mL. A negative result does not preclude SARS-Cov-2 infection and should not be used as the sole basis for treatment or other patient management decisions. A negative result may occur with  improper specimen collection/handling, submission of specimen other than nasopharyngeal swab, presence of viral mutation(s) within the areas targeted by this assay, and inadequate number of viral copies(<138 copies/mL). A negative result must be combined with clinical observations, patient history, and epidemiological information. The expected result is Negative.  Fact Sheet for Patients:  BloggerCourse.com  Fact Sheet for Healthcare Providers:  SeriousBroker.it  This test is no t yet approved or cleared by the [place] FDA and  has been authorized for detection and/or diagnosis of SARS-CoV-2 by FDA under an Emergency Use Authorization (EUA). This EUA will remain  in effect (meaning this test can be used) for the duration of the COVID-19 declaration under  Section 564(b)(1) of the Act, 21 U.S.C.section 360bbb-3(b)(1), unless the authorization is terminated  or revoked sooner.       Influenza A by PCR NEGATIVE NEGATIVE Final   Influenza B by PCR NEGATIVE NEGATIVE Final    Comment: (NOTE) The Xpert Xpress SARS-CoV-2/FLU/RSV plus assay is intended as an aid in the diagnosis of influenza from Nasopharyngeal swab specimens and should not be used as a sole basis for treatment. Nasal washings and aspirates are unacceptable for Xpert Xpress SARS-CoV-2/FLU/RSV testing.  Fact Sheet for Patients: BloggerCourse.com  Fact Sheet for Healthcare Providers: SeriousBroker.it  This test is not yet approved or cleared by the [place] FDA and has been authorized for detection and/or diagnosis of SARS-CoV-2 by FDA under an Emergency Use Authorization (EUA). This EUA will remain in effect (meaning this test can be used) for the duration of the COVID-19 declaration under Section 564(b)(1) of the Act, 21 U.S.C. section 360bbb-3(b)(1), unless the authorization is terminated or revoked.  Performed at [facility], [address], [place], [place] [address]      Time coordinating discharge in minutes: 65  SIGNED:   Calvert Cantor, MD  [facility] 01/02/2021, 5:58  PM

## 2021-01-02 NOTE — Progress Notes (Signed)
Dr. Butler Denmark called and spoke with patient. MD aware that patient's only support person is unable to come get patient tonight if he is discharged because his support person/caregiver is sick.  MD instructed RN to ask social worker to try and arrange transport home for tonight.  Patient verbalized that he is agreeable to be discharged home tonight if we can arrange for transportation home. Charge nurse Social research officer, government.

## 2021-01-02 NOTE — Care Management (Signed)
ED RN spoke with RN concerning transportation, Taxi voucher sent to station #[phone] for transportation home.

## 2021-01-02 NOTE — TOC Initial Note (Signed)
Transition of Care [facility]) - Initial/Assessment Note    Patient Details  Name: Raymond White MRN: [id number] Date of Birth: 28-Mar-1951  Transition of [facility]) CM/SW Contact:    Lockie Pares, RN Phone Number: 01/02/2021, 8:41 AM  Clinical Narrative:                 Patinet admitted with weight gain [facility], had a episode of Vtach also of fatigue extreme headche, CT negative code stroke was initiated, neurology consulted, believe it was a migraine type issue.  Patient will need to check his weights daily and follow up with MD Had a HH aide in the past. CM will follow for needs.   Expected Discharge Plan: Home/Self Care Barriers to Discharge: Continued Medical Work up   Patient Goals and CMS Choice        Expected Discharge Plan and Services Expected Discharge Plan: Home/Self Care   Discharge Planning Services: CM Consult   Living arrangements for the past 2 months: Apartment                                      Prior Living Arrangements/Services Living arrangements for the past 2 months: Apartment Lives with:: Spouse Patient language and need for interpreter reviewed:: Yes        Need for Family Participation in Patient Care: Yes (Comment) Care giver support system in place?: Yes (comment)   Criminal Activity/Legal Involvement Pertinent to Current Situation/Hospitalization: No - Comment as needed  Activities of Daily Living Home Assistive Devices/Equipment: Cane (specify quad or straight) ADL Screening (condition at time of admission) Patient's cognitive ability adequate to safely complete daily activities?: Yes Is the patient deaf or have difficulty hearing?: No Does the patient have difficulty seeing, even when wearing glasses/contacts?: No Does the patient have difficulty concentrating, remembering, or making decisions?: No Patient able to express need for assistance with ADLs?: Yes Does the patient have difficulty dressing or bathing?:  No Independently performs ADLs?: Yes (appropriate for developmental age) Does the patient have difficulty walking or climbing stairs?: Yes Weakness of Legs: Both Weakness of Arms/Hands: None  Permission Sought/Granted                  Emotional Assessment       Orientation: : Oriented to Self,Oriented to Place,Oriented to  Time,Oriented to Situation Alcohol / Substance Use: Not Applicable Psych Involvement: No (comment)  Admission diagnosis:  CHF exacerbation (HCC) [I50.9] Acute diastolic congestive heart failure (HCC) [I50.31] Acute on chronic diastolic congestive heart failure (HCC) [I50.33] Acute respiratory failure (HCC) [J96.00] Patient Active Problem List   Diagnosis Date Noted  . Bradycardia 12/29/2020  . CAD (coronary artery disease) 12/29/2020  . Acute respiratory failure (HCC) 12/29/2020  . Fall at home, initial encounter 09/23/2020  . Acute respiratory failure with hypoxemia (HCC) 08/27/2020  . Acute on chronic diastolic CHF (congestive heart failure) (HCC) 08/27/2020  . Acute CHF (congestive heart failure) (HCC) 08/27/2020  . Acute congestive heart failure (HCC) 03/29/2020  . Acute right-sided weakness 03/28/2020  . Dyspnea on exertion 03/28/2020  . Respiratory failure with hypoxia (HCC) 10/22/2018  . Acute respiratory failure with hypoxia (HCC) 10/21/2018  . Influenza A 10/21/2018  . Parkinson disease (HCC) 10/21/2018  . DDD (degenerative disc disease), lumbar 12/15/2017  . Klebsiella cystitis 12/15/2017  . 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  . Chronic heart failure with preserved ejection fraction (HCC) 12/15/2017  .  GERD (gastroesophageal reflux disease) 12/15/2017  . Hypertension 12/15/2017  . Depressive disorder 12/15/2017  . Anxiety 12/15/2017   PCP:  Clinic, [facility] Pharmacy:   [facility] - [place], [place] - [address] [place] [address] Phone: [phone]  Fax: [phone]     Social Determinants of Health (SDOH) Interventions    Readmission Risk Interventions No flowsheet data found.

## 2021-01-02 NOTE — Progress Notes (Signed)
Dianna, RN charge nurse taking over patient's care and will discharge patient home.  Patient waiting on ride.  He now states that his caregiver will be able to come get him and take him home and to the pharmacy to get prescription.

## 2021-01-02 NOTE — Progress Notes (Signed)
Went over discharge instructions with the patient. Patient verbalizes understanding and has no questions about medication changes. Patient is aware that he needs to follow up with cardiology and PCP. IV has been removed. Transportation has been called.

## 2021-01-02 NOTE — Progress Notes (Signed)
Stress portion completed without complications.  Nuc portion pending.

## 2021-01-02 NOTE — Plan of Care (Signed)

## 2021-01-02 NOTE — Progress Notes (Signed)
Progress Note  Patient Name: Raymond White Date of Encounter: 01/02/2021  [facility] Cardiologist: Kristeen Miss, MD   Subjective   Breathing has improved but he still feels short of breath with minimal exertion.  Today he tried to wash up and got short of breath at the sink.  He denies any chest pain or pressure.  Inpatient Medications    Scheduled Meds: . aspirin  81 mg Oral Daily  . busPIRone  10 mg Oral TID  . carbidopa-levodopa  2.5 tablet Oral TID  . cholecalciferol  1,000 Units Oral Daily  . clopidogrel  75 mg Oral Daily  . DULoxetine  30 mg Oral BID  . enoxaparin (LOVENOX) injection  40 mg Subcutaneous Q24H  . famotidine  20 mg Oral BID  . furosemide  80 mg Oral Daily  . gabapentin  300 mg Oral TID  . magnesium oxide  400 mg Oral BID  . metoprolol tartrate  25 mg Oral BID  . nortriptyline  10 mg Oral QHS  . potassium chloride  20 mEq Oral Daily  . pregabalin  75 mg Oral BID  . rosuvastatin  40 mg Oral QHS  . tamsulosin  0.4 mg Oral QHS  . thiamine  100 mg Oral Daily  . vitamin B-12  1,000 mcg Oral Daily   Continuous Infusions:  PRN Meds: acetaminophen **OR** acetaminophen   Vital Signs    Vitals:   01/01/21 1123 01/01/21 1939 01/01/21 2211 01/02/21 0458  BP: 112/74 106/65 114/70 116/78  Pulse: 73 73 69 69  Resp: 18 16  16   Temp: 97.7 F (36.5 C) 97.9 F (36.6 C)  98 F (36.7 C)  TempSrc: Oral Oral    SpO2: 90% 94%  93%  Weight:    100.8 kg  Height:        Intake/Output Summary (Last 24 hours) at 01/02/2021 0804 Last data filed at 01/01/2021 1900 Gross per 24 hour  Intake 120 ml  Output 600 ml  Net -480 ml   Last 3 Weights 01/02/2021 01/01/2021 12/31/2020  Weight (lbs) 222 lb 3.2 oz 222 lb 3.2 oz 223 lb 1.6 oz  Weight (kg) 100.789 kg 100.789 kg 101.197 kg      Telemetry    Sinus rhythm.  Rare PVCs.- Personally Reviewed  ECG    N/A- Personally Reviewed  Physical Exam   VS:  BP 116/78 (BP Location: Left Arm)   Pulse 69   Temp  98 F (36.7 C)   Resp 16   Ht 6\' 1"  (1.854 m)   Wt 100.8 kg Comment: scale a  SpO2 93%   BMI 29.32 kg/m  , BMI Body mass index is 29.32 kg/m. GENERAL:  Well appearing HEENT: Pupils equal round and reactive, fundi not visualized, oral mucosa unremarkable NECK:  JVP at clavicle at 45 degrees, waveform within normal limits, carotid upstroke brisk and symmetric, no bruits LUNGS:  Clear to auscultation bilaterally HEART:  RRR.  PMI not displaced or sustained,S1 and S2 within normal limits, no S3, no S4, no clicks, no rubs, no murmurs ABD:  Flat, positive bowel sounds normal in frequency in pitch, no bruits, no rebound, no guarding, no midline pulsatile mass, no hepatomegaly, no splenomegaly EXT:  2 plus pulses throughout, no edema, no cyanosis no clubbing SKIN:  No rashes no nodules NEURO:  Cranial nerves II through XII grossly intact, motor grossly intact throughout PSYCH:  Cognitively intact, oriented to person place and time  Labs    High Sensitivity  Troponin:  No results for input(s): TROPONINIHS in the last 720 hours.    Chemistry Recent Labs  Lab 12/28/20 2159 12/30/20 0246 12/31/20 0739  NA 137 138 138  K 3.9 3.5 3.6  CL 104 101 99  CO2 27 28 30   GLUCOSE 102* 105* 123*  BUN 12 13 13   CREATININE 0.75 0.85 0.96  CALCIUM 8.6* 8.9 8.8*  PROT 5.2*  --   --   ALBUMIN 3.1*  --   --   AST 15  --   --   ALT 9  --   --   ALKPHOS 58  --   --   BILITOT 0.8  --   --   GFRNONAA >60 >60 >60  ANIONGAP 6 9 9      Hematology Recent Labs  Lab 12/28/20 2159 12/30/20 0246  WBC 5.4 7.8  RBC 3.91* 4.69  HGB 13.0 15.6  HCT 39.5 45.9  MCV 101.0* 97.9  MCH 33.2 33.3  MCHC 32.9 34.0  RDW 14.2 14.4  PLT 138* 156    BNP Recent Labs  Lab 12/28/20 2159  BNP 198.3*     DDimer  Recent Labs  Lab 12/29/20 0226  DDIMER 0.40     Radiology    ECHOCARDIOGRAM COMPLETE  Result Date: 12/31/2020    ECHOCARDIOGRAM REPORT   Patient Name:   SUN WILENSKY Torosian Date of Exam:  12/31/2020 Medical Rec #:  01/02/2021         Height:       73.0 in Accession #:    Adah Salvage        Weight:       223.1 lb Date of Birth:  1950/09/02         BSA:          2.254 m Patient Age:    70 years          BP:           107/75 mmHg Patient Gender: M                 HR:           73 bpm. Exam Location:  Inpatient Procedure: 2D Echo, Cardiac Doppler and Color Doppler Indications:    Ventricular Tachycardia I47.2  History:        Patient has prior history of Echocardiogram examinations. CHF,                 CAD; Risk Factors:Hypertension. Parkinson's.  Sonographer:    [id number] RCS Referring Phys: [id number] ANGELA NICOLE DUKE IMPRESSIONS  1. Left ventricular ejection fraction, by estimation, is 60 to 65%. The left ventricle has normal function. The left ventricle has no regional wall motion abnormalities. There is mild concentric left ventricular hypertrophy. Left ventricular diastolic parameters are consistent with Grade I diastolic dysfunction (impaired relaxation).  2. Right ventricular systolic function is normal. The right ventricular size is normal. Tricuspid regurgitation signal is inadequate for assessing PA pressure.  3. The mitral valve is normal in structure. No evidence of mitral valve regurgitation. No evidence of mitral stenosis.  4. The aortic valve is tricuspid. Aortic valve regurgitation is mild. Mild to moderate aortic valve sclerosis/calcification is present, without any evidence of aortic stenosis. Aortic regurgitation PHT measures 536 msec.  5. Aortic dilatation noted. There is mild dilatation of the aortic root, measuring 38 mm. FINDINGS  Left Ventricle: Left ventricular ejection fraction, by estimation, is 60 to 65%. The left ventricle has normal function. The  left ventricle has no regional wall motion abnormalities. The left ventricular internal cavity size was normal in size. There is  mild concentric left ventricular hypertrophy. Left ventricular diastolic parameters are consistent  with Grade I diastolic dysfunction (impaired relaxation). Normal left ventricular filling pressure. Right Ventricle: The right ventricular size is normal. No increase in right ventricular wall thickness. Right ventricular systolic function is normal. Tricuspid regurgitation signal is inadequate for assessing PA pressure. Left Atrium: Left atrial size was normal in size. Right Atrium: Right atrial size was normal in size. Pericardium: There is no evidence of pericardial effusion. Mitral Valve: The mitral valve is normal in structure. No evidence of mitral valve regurgitation. No evidence of mitral valve stenosis. Tricuspid Valve: The tricuspid valve is normal in structure. Tricuspid valve regurgitation is not demonstrated. No evidence of tricuspid stenosis. Aortic Valve: The aortic valve is tricuspid. Aortic valve regurgitation is mild. Aortic regurgitation PHT measures 536 msec. Mild to moderate aortic valve sclerosis/calcification is present, without any evidence of aortic stenosis. Pulmonic Valve: The pulmonic valve was normal in structure. Pulmonic valve regurgitation is mild. No evidence of pulmonic stenosis. Aorta: Aortic dilatation noted. There is mild dilatation of the aortic root, measuring 38 mm. IAS/Shunts: No atrial level shunt detected by color flow Doppler.  LEFT VENTRICLE PLAX 2D LVIDd:         4.60 cm  Diastology LVIDs:         2.50 cm  LV e' medial:    5.77 cm/s LV PW:         1.10 cm  LV E/e' medial:  10.4 LV IVS:        1.15 cm  LV e' lateral:   8.59 cm/s LVOT diam:     2.00 cm  LV E/e' lateral: 7.0 LV SV:         62 LV SV Index:   27 LVOT Area:     3.14 cm  RIGHT VENTRICLE RV S prime:     17.10 cm/s TAPSE (M-mode): 1.9 cm LEFT ATRIUM             Index LA diam:        4.25 cm 1.89 cm/m LA Vol (A2C):   56.4 ml 25.02 ml/m LA Vol (A4C):   35.0 ml 15.53 ml/m LA Biplane Vol: 44.5 ml 19.74 ml/m  AORTIC VALVE LVOT Vmax:   101.00 cm/s LVOT Vmean:  74.000 cm/s LVOT VTI:    0.197 m AI PHT:      536 msec   AORTA Ao Root diam: 4.00 cm MITRAL VALVE MV Area (PHT): 2.99 cm    SHUNTS MV Decel Time: 254 msec    Systemic VTI:  0.20 m MV E velocity: 60.00 cm/s  Systemic Diam: 2.00 cm MV A velocity: 73.00 cm/s MV E/A ratio:  0.82 Armanda Magicraci Turner MD Electronically signed by Armanda Magicraci Turner MD Signature Date/Time: 12/31/2020/6:25:20 PM    Final     Cardiac Studies   Echo 12/31/2020 1. Left ventricular ejection fraction, by estimation, is 60 to 65%. The  left ventricle has normal function. The left ventricle has no regional  wall motion abnormalities. There is mild concentric left ventricular  hypertrophy. Left ventricular diastolic  parameters are consistent with Grade I diastolic dysfunction (impaired  relaxation).  2. Right ventricular systolic function is normal. The right ventricular  size is normal. Tricuspid regurgitation signal is inadequate for assessing  PA pressure.  3. The mitral valve is normal in structure. No evidence of mitral valve  regurgitation. No evidence of mitral stenosis.  4. The aortic valve is tricuspid. Aortic valve regurgitation is mild.  Mild to moderate aortic valve sclerosis/calcification is present, without  any evidence of aortic stenosis. Aortic regurgitation PHT measures 536  msec.  5. Aortic dilatation noted. There is mild dilatation of the aortic root,  measuring 38 mm.   Patient Profile     70 y.o. male with chronic diastolic heart failure, CAD status post PCI (?  2010 at the [facility]), hypertension, hyperlipidemia, Parkinson's, prior stroke, questionable history of atrial fibrillation, and alcohol abuse admitted with NSVT.  Assessment & Plan    # NSVT/PVCs: Going for [employer] today.  Maintain K greater than 4 and magnesium greater than 2.  Continue metoprolol.  # CAD s/p PCI: Unclear history of PCI in 2010.  He remains on aspirin and clopidogrel.  Unclear why he is still on DAPT.  Stress test pending for today.  He went yesterday and IV was  infiltrated.  Continue aspirin, clopidogrel, metoprolol, and rosuvastatin.  # Acute on chronic diastolic heart failure:  BNP mildly elevated to 198.  Home Lasix was recently increased.  Patient was reportedly drinking 5 L on admission and was counseled to limit this to 2 L.  He was switched back to oral Lasix on 5/15.  He was net -500 mL yesterday.  He is -7.8 L since admission.  Renal function remained stable.  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  There is a question of whether he had a prior talc pleurodesis.  He appears to be euvolemic.  We are getting a Lexiscan Myoview above to assess for ischemia.  If this is normal, suspect his symptoms may be more pulmonary related.       For questions or updates, please contact [facility] Please consult [email or web address] for contact info under        Signed, Chilton Si, MD  01/02/2021, 8:04 AM

## 2021-02-20 ENCOUNTER — Emergency Department (HOSPITAL_COMMUNITY): Payer: Medicare Other

## 2021-02-20 ENCOUNTER — Other Ambulatory Visit: Payer: Self-pay

## 2021-02-20 ENCOUNTER — Encounter (HOSPITAL_COMMUNITY): Payer: Self-pay | Admitting: *Deleted

## 2021-02-20 ENCOUNTER — Emergency Department (HOSPITAL_COMMUNITY)
Admission: EM | Admit: 2021-02-20 | Discharge: 2021-02-21 | Disposition: A | Discharge status: Home / Self Care | Payer: Medicare Other | Attending: Emergency Medicine | Admitting: Emergency Medicine

## 2021-02-20 DIAGNOSIS — Z79899 Other long term (current) drug therapy: Secondary | ICD-10-CM | POA: Diagnosis not present

## 2021-02-20 DIAGNOSIS — Z20822 Contact with and (suspected) exposure to covid-19: Secondary | ICD-10-CM | POA: Diagnosis not present

## 2021-02-20 DIAGNOSIS — I5033 Acute on chronic diastolic (congestive) heart failure: Secondary | ICD-10-CM | POA: Diagnosis not present

## 2021-02-20 DIAGNOSIS — Y907 Blood alcohol level of 200-239 mg/100 ml: Secondary | ICD-10-CM | POA: Insufficient documentation

## 2021-02-20 DIAGNOSIS — I110 Hypertensive heart disease with heart failure: Secondary | ICD-10-CM | POA: Diagnosis not present

## 2021-02-20 DIAGNOSIS — R000 Tachycardia, unspecified: Secondary | ICD-10-CM | POA: Insufficient documentation

## 2021-02-20 DIAGNOSIS — R0602 Shortness of breath: Secondary | ICD-10-CM | POA: Diagnosis present

## 2021-02-20 DIAGNOSIS — G20 Parkinson's disease: Secondary | ICD-10-CM | POA: Diagnosis not present

## 2021-02-20 DIAGNOSIS — I2510 Atherosclerotic heart disease of native coronary artery without angina pectoris: Secondary | ICD-10-CM | POA: Diagnosis not present

## 2021-02-20 DIAGNOSIS — I509 Heart failure, unspecified: Secondary | ICD-10-CM

## 2021-02-20 LAB — TROPONIN I (HIGH SENSITIVITY)
Troponin I (High Sensitivity): 10 ng/L (ref <18)
Troponin I (High Sensitivity): 9 ng/L (ref <18)

## 2021-02-20 LAB — RESP PANEL BY RT-PCR (FLU A&B, COVID) ARPGX2
Influenza A by PCR: NEGATIVE
Influenza B by PCR: NEGATIVE
SARS Coronavirus 2 by RT PCR: NEGATIVE

## 2021-02-20 LAB — CBC WITH DIFFERENTIAL/PLATELET
Abs Immature Granulocytes: 0.02 10*3/uL (ref 0.00–0.07)
Basophils Absolute: 0 10*3/uL (ref 0.0–0.1)
Basophils Relative: 1 %
Eosinophils Absolute: 0 10*3/uL (ref 0.0–0.5)
Eosinophils Relative: 1 %
HCT: 45.9 % (ref 39.0–52.0)
Hemoglobin: 15.2 g/dL (ref 13.0–17.0)
Immature Granulocytes: 0 %
Lymphocytes Relative: 21 %
Lymphs Abs: 1.2 10*3/uL (ref 0.7–4.0)
MCH: 32.5 pg (ref 26.0–34.0)
MCHC: 33.1 g/dL (ref 30.0–36.0)
MCV: 98.3 fL (ref 80.0–100.0)
Monocytes Absolute: 0.5 10*3/uL (ref 0.1–1.0)
Monocytes Relative: 9 %
Neutro Abs: 4 10*3/uL (ref 1.7–7.7)
Neutrophils Relative %: 68 %
Platelets: 166 10*3/uL (ref 150–400)
RBC: 4.67 MIL/uL (ref 4.22–5.81)
RDW: 13.9 % (ref 11.5–15.5)
WBC: 5.9 10*3/uL (ref 4.0–10.5)
nRBC: 0 % (ref 0.0–0.2)

## 2021-02-20 LAB — BASIC METABOLIC PANEL
Anion gap: 13 (ref 5–15)
BUN: 11 mg/dL (ref 8–23)
CO2: 20 mmol/L — ABNORMAL LOW (ref 22–32)
Calcium: 8.3 mg/dL — ABNORMAL LOW (ref 8.9–10.3)
Chloride: 106 mmol/L (ref 98–111)
Creatinine, Ser: 0.81 mg/dL (ref 0.61–1.24)
GFR, Estimated: >60 mL/min (ref >60)
Glucose, Bld: 99 mg/dL (ref 70–99)
Potassium: 4.1 mmol/L (ref 3.5–5.1)
Sodium: 139 mmol/L (ref 135–145)

## 2021-02-20 LAB — HEPATIC FUNCTION PANEL
ALT: 25 U/L (ref 0–44)
AST: 27 U/L (ref 15–41)
Albumin: 3.4 g/dL — ABNORMAL LOW (ref 3.5–5.0)
Alkaline Phosphatase: 81 U/L (ref 38–126)
Bilirubin, Direct: 0.2 mg/dL (ref 0.0–0.2)
Indirect Bilirubin: 0.9 mg/dL (ref 0.3–0.9)
Total Bilirubin: 1.1 mg/dL (ref 0.3–1.2)
Total Protein: 5.8 g/dL — ABNORMAL LOW (ref 6.5–8.1)

## 2021-02-20 LAB — ETHANOL: Alcohol, Ethyl (B): 232 mg/dL — ABNORMAL HIGH (ref <10)

## 2021-02-20 LAB — BRAIN NATRIURETIC PEPTIDE: B Natriuretic Peptide: 45.1 pg/mL (ref 0.0–100.0)

## 2021-02-20 MED ORDER — GABAPENTIN 300 MG PO CAPS: 300.0000 mg | ORAL_CAPSULE | Freq: Three times a day (TID) | ORAL | Status: DC

## 2021-02-20 MED ORDER — BUSPIRONE HCL 10 MG PO TABS: 10.0000 mg | ORAL_TABLET | Freq: Three times a day (TID) | ORAL | Status: DC

## 2021-02-20 MED ORDER — MAGNESIUM OXIDE 400 MG PO TABS: 400.0000 mg | ORAL_TABLET | Freq: Two times a day (BID) | ORAL | Status: DC

## 2021-02-20 MED ORDER — THIAMINE HCL 100 MG/ML IJ SOLN: 100.0000 mg | Freq: Every day | INTRAMUSCULAR | Status: DC

## 2021-02-20 MED ORDER — THIAMINE HCL 100 MG PO TABS
100.0000 mg | ORAL_TABLET | Freq: Every day | ORAL | Status: DC
  Administered 2021-02-20: 100 mg via ORAL
  Filled 2021-02-20: qty 1

## 2021-02-20 MED ORDER — POTASSIUM CHLORIDE CRYS ER 20 MEQ PO TBCR
20.0000 meq | EXTENDED_RELEASE_TABLET | Freq: Every day | ORAL | 0 refills | Status: AC
  Filled 2021-02-20: qty 30, 30d supply, fill #0

## 2021-02-20 MED ORDER — CLOPIDOGREL BISULFATE 75 MG PO TABS
75.0000 mg | ORAL_TABLET | Freq: Every day | ORAL | Status: DC
  Administered 2021-02-20: 75 mg via ORAL
  Filled 2021-02-20: qty 1

## 2021-02-20 MED ORDER — OMEPRAZOLE 20 MG PO CPDR
20.0000 mg | DELAYED_RELEASE_CAPSULE | Freq: Every day | ORAL | 0 refills | Status: AC | PRN
  Filled 2021-02-20: qty 30, 30d supply, fill #0

## 2021-02-20 MED ORDER — DULOXETINE HCL 30 MG PO CPEP: 30.0000 mg | ORAL_CAPSULE | Freq: Two times a day (BID) | ORAL | Status: DC

## 2021-02-20 MED ORDER — PRAVASTATIN SODIUM 40 MG PO TABS
40.0000 mg | ORAL_TABLET | Freq: Every day | ORAL | Status: DC
  Administered 2021-02-20: 40 mg via ORAL
  Filled 2021-02-20: qty 1

## 2021-02-20 MED ORDER — THIAMINE HCL 100 MG PO TABS
100.0000 mg | ORAL_TABLET | Freq: Every day | ORAL | 0 refills | Status: AC
  Filled 2021-02-20: qty 30, 30d supply, fill #0

## 2021-02-20 MED ORDER — FUROSEMIDE 40 MG PO TABS
40.0000 mg | ORAL_TABLET | Freq: Every day | ORAL | 0 refills | Status: AC
  Filled 2021-02-20: qty 30, 30d supply, fill #0

## 2021-02-20 MED ORDER — SPIRONOLACTONE 25 MG PO TABS
25.0000 mg | ORAL_TABLET | Freq: Every day | ORAL | 0 refills | Status: AC
  Filled 2021-02-20: qty 30, 30d supply, fill #0

## 2021-02-20 MED ORDER — LORAZEPAM 1 MG PO TABS: 0.0000 mg | ORAL_TABLET | Freq: Two times a day (BID) | ORAL | Status: DC

## 2021-02-20 MED ORDER — CARBIDOPA-LEVODOPA 25-100 MG PO TABS
2.5000 | ORAL_TABLET | Freq: Three times a day (TID) | ORAL | 0 refills | Status: AC
  Filled 2021-02-20: qty 200, 27d supply, fill #0

## 2021-02-20 MED ORDER — FUROSEMIDE 10 MG/ML IJ SOLN
80.0000 mg | Freq: Once | INTRAMUSCULAR | Status: AC
  Administered 2021-02-20: 80 mg via INTRAVENOUS
  Filled 2021-02-20: qty 8

## 2021-02-20 MED ORDER — LORAZEPAM 1 MG PO TABS: 0.0000 mg | ORAL_TABLET | Freq: Four times a day (QID) | ORAL | Status: DC

## 2021-02-20 MED ORDER — METOPROLOL TARTRATE 25 MG PO TABS
25.0000 mg | ORAL_TABLET | Freq: Two times a day (BID) | ORAL | 0 refills | Status: AC
  Filled 2021-02-20: qty 60, 30d supply, fill #0

## 2021-02-20 MED ORDER — PREGABALIN 25 MG PO CAPS: 75.0000 mg | ORAL_CAPSULE | Freq: Two times a day (BID) | ORAL | Status: DC

## 2021-02-20 MED ORDER — CARVEDILOL 6.25 MG PO TABS
3.1250 mg | ORAL_TABLET | Freq: Two times a day (BID) | ORAL | 0 refills | Status: AC
  Filled 2021-02-20: qty 30, 30d supply, fill #0

## 2021-02-20 MED ORDER — ROSUVASTATIN CALCIUM 20 MG PO TABS: 40.0000 mg | ORAL_TABLET | Freq: Every day | ORAL | Status: DC

## 2021-02-20 MED ORDER — METOPROLOL TARTRATE 25 MG PO TABS
25.0000 mg | ORAL_TABLET | Freq: Two times a day (BID) | ORAL | Status: DC
  Administered 2021-02-20 (×2): 25 mg via ORAL
  Filled 2021-02-20 (×2): qty 1

## 2021-02-20 MED ORDER — PANTOPRAZOLE SODIUM 40 MG PO TBEC
40.0000 mg | DELAYED_RELEASE_TABLET | Freq: Every day | ORAL | Status: DC
  Administered 2021-02-20: 40 mg via ORAL
  Filled 2021-02-20: qty 1

## 2021-02-20 MED ORDER — ASPIRIN 81 MG PO CHEW
81.0000 mg | CHEWABLE_TABLET | Freq: Every day | ORAL | Status: DC
  Administered 2021-02-20: 81 mg via ORAL
  Filled 2021-02-20: qty 1

## 2021-02-20 MED ORDER — NORTRIPTYLINE HCL 10 MG PO CAPS: 10.0000 mg | ORAL_CAPSULE | Freq: Every day | ORAL | Status: DC

## 2021-02-20 MED ORDER — LORAZEPAM 2 MG/ML IJ SOLN: 0.0000 mg | Freq: Two times a day (BID) | INTRAMUSCULAR | Status: DC

## 2021-02-20 MED ORDER — EMPAGLIFLOZIN 25 MG PO TABS
25.0000 mg | ORAL_TABLET | Freq: Every day | ORAL | 0 refills | Status: AC
  Filled 2021-02-20: qty 14, 14d supply, fill #0

## 2021-02-20 MED ORDER — CARBIDOPA-LEVODOPA 25-100 MG PO TABS: 2.5000 | ORAL_TABLET | Freq: Three times a day (TID) | ORAL | Status: DC

## 2021-02-20 MED ORDER — SPIRONOLACTONE 25 MG PO TABS
25.0000 mg | ORAL_TABLET | Freq: Every day | ORAL | Status: DC
  Administered 2021-02-20: 25 mg via ORAL
  Filled 2021-02-20 (×2): qty 1

## 2021-02-20 MED ORDER — LORAZEPAM 2 MG/ML IJ SOLN: 0.0000 mg | Freq: Four times a day (QID) | INTRAMUSCULAR | Status: DC

## 2021-02-20 MED ORDER — ACETAMINOPHEN 325 MG PO TABS: 650.0000 mg | ORAL_TABLET | ORAL | Status: DC | PRN

## 2021-02-20 NOTE — ED Triage Notes (Signed)
Patient presents to ed via [facility] states he is on lasix 80 mg bid and hasn't had his medication for 3-4 days. States his friend  normally gives him his medication and he hasn't been able to get in touch with her for 3-4 days. After arriving  to ED now c/o left anterior chest pain c/o feeling depressed , denies SI or HI. Admits to drinking alcohol 2 boxes of wine of which is normal for him. Bilateral pedal edema

## 2021-02-20 NOTE — ED Provider Notes (Signed)
[facility] EMERGENCY DEPARTMENT Provider Note   CSN: [id number] Arrival date & time: 02/20/21  0731     History Chief Complaint  Patient presents with   Shortness of Breath    Raymond White is a 70 y.o. male w/ hx of CHF, parkinson's disease, presenting to ED with SOB.  He reports his caregiver has not come to the house for the past 3 days, and she keeps all of his medications.  He has not taken any meds in 3 days.  He reports worsening bilateral LE edema, dyspnea, orthopnea.  Normally takes 80 mg lasix daily.  He also has not had his other meds including his levodopa.  He lives by himself.  His caretaker is a "friend" not through an agency.  He has no family in the area or other acquaintances  He drinks etoh daily.  He reports consumption of 2 "bags" of wine in the past day, last drink this morning  HPI     Past Medical History:  Diagnosis Date   Acute cystitis with hematuria    Acute midline low back pain with sciatica    CHF (congestive heart failure) (HCC)    Chronic heart failure with preserved ejection fraction (HCC)    DDD (degenerative disc disease), lumbar    Depressive disorder    Drug induced constipation    GERD (gastroesophageal reflux disease)    History of TIA (transient ischemic attack)    Hypertension    Pancreatitis    Parkinson's disease (HCC) 2015   Stroke (HCC)    Uncomplicated alcohol dependence (HCC)    Urinary incontinence     Patient Active Problem List   Diagnosis Date Noted   V tach (HCC) 01/02/2021   Bradycardia 12/29/2020   CAD (coronary artery disease) 12/29/2020   Acute respiratory failure (HCC) 12/29/2020   Fall at home, initial encounter 09/23/2020   Acute respiratory failure with hypoxemia (HCC) 08/27/2020   Acute on chronic diastolic CHF (congestive heart failure) (HCC) 08/27/2020   Acute CHF (congestive heart failure) (HCC) 08/27/2020   Acute congestive heart failure (HCC) 03/29/2020   Acute right-sided  weakness 03/28/2020   Dyspnea on exertion 03/28/2020   Respiratory failure with hypoxia (HCC) 10/22/2018   Acute respiratory failure with hypoxia (HCC) 10/21/2018   Influenza A 10/21/2018   Parkinson disease (HCC) 10/21/2018   DDD (degenerative disc disease), lumbar 12/15/2017   Klebsiella cystitis 12/15/2017   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   Chronic heart failure with preserved ejection fraction (HCC) 12/15/2017   GERD (gastroesophageal reflux disease) 12/15/2017   Hypertension 12/15/2017   Depressive disorder 12/15/2017   Anxiety 12/15/2017    Past Surgical History:  Procedure Laterality Date   APPENDECTOMY     BACK SURGERY     x5   CHOLECYSTECTOMY     COLONOSCOPY     ESOPHAGOGASTRODUODENOSCOPY     KNEE ARTHROSCOPY     neck fusion         Family History  Problem Relation Age of Onset   Hyperlipidemia Mother    Colon cancer Father     Social History   Tobacco Use   Smoking status: Never   Smokeless tobacco: Never  Vaping Use   Vaping Use: Never used  Substance Use Topics   Alcohol use: Yes    Comment: Vodka   Drug use: Never    Home Medications Prior to Admission medications   Medication Sig Start Date End Date Taking? Authorizing Provider  DULoxetine (CYMBALTA) 30 MG  capsule Take 30 mg by mouth 2 (two) times daily.   Yes [provider]  furosemide (LASIX) 40 MG tablet Take 1 tablet (40 mg total) by mouth daily. 02/20/21 03/22/21 Yes Austina Constantin, Kermit BaloMatthew J, MD  gabapentin (NEURONTIN) 300 MG capsule Take 300 mg by mouth 3 (three) times daily.   Yes [provider]  lovastatin (MEVACOR) 40 MG tablet Take 40 mg by mouth at bedtime.   Yes [provider]  magnesium oxide (MAG-OX) 400 MG tablet Take 400 mg by mouth 2 (two) times daily.   Yes [provider]  tamsulosin (FLOMAX) 0.4 MG CAPS capsule Take 0.4 mg by mouth at bedtime. Hold Dose For Systolic Pressure<100   Yes [provider]  tiZANidine (ZANAFLEX) 4 MG tablet Take 4  mg by mouth 3 (three) times daily.   Yes [provider]  acetaminophen (TYLENOL) 500 MG tablet Take 500 mg by mouth every 6 (six) hours as needed for moderate pain. Patient not taking: No sig reported    [provider]  busPIRone (BUSPAR) 10 MG tablet Take 1 tablet (10 mg total) by mouth 3 (three) times daily. Patient not taking: Reported on 02/20/2021 08/31/20   Lanae BoastKc, Ramesh, MD  carbidopa-levodopa (SINEMET IR) 25-100 MG tablet Take 2.5 tablets by mouth 3 (three) times daily. 02/20/21 03/22/21  Terald Sleeperrifan, Ajwa Kimberley J, MD  carvedilol (COREG) 6.25 MG tablet Take 0.5 tablets (3.125 mg total) by mouth 2 (two) times daily with a meal. 02/20/21 03/22/21  Terald Sleeperrifan, Jacson Rapaport J, MD  diphenoxylate-atropine (LOMOTIL) 2.5-0.025 MG tablet Take 1 tablet by mouth 3 (three) times daily as needed for diarrhea or loose stools. Patient not taking: No sig reported 08/31/20   Lanae BoastKc, Ramesh, MD  empagliflozin (JARDIANCE) 25 MG TABS tablet Take 1 tablet (25 mg total) by mouth daily. 02/20/21 03/22/21  Terald Sleeperrifan, Amora Sheehy J, MD  famotidine (PEPCID) 20 MG tablet Take 1 tablet (20 mg total) by mouth in the morning and at bedtime. Patient not taking: Reported on 02/20/2021 08/31/20   Lanae BoastKc, Ramesh, MD  metoprolol tartrate (LOPRESSOR) 25 MG tablet Take 1 tablet (25 mg total) by mouth 2 (two) times daily. 02/20/21 03/22/21  Terald Sleeperrifan, Illona Bulman J, MD  omeprazole (PRILOSEC) 20 MG capsule Take 1 capsule (20 mg total) by mouth daily as needed (heartburn/reflux). 02/20/21 03/22/21  Terald Sleeperrifan, Hebe Merriwether J, MD  potassium chloride SA (KLOR-CON) 20 MEQ tablet Take 1 tablet (20 mEq total) by mouth daily. With food 02/20/21 03/22/21  Terald Sleeperrifan, Carrine Kroboth J, MD  spironolactone (ALDACTONE) 25 MG tablet Take 1 tablet (25 mg total) by mouth daily. 02/20/21 03/22/21  Terald Sleeperrifan, Norena Bratton J, MD  thiamine (VITAMIN B-1) 100 MG tablet Take 1 tablet (100 mg total) by mouth daily. 02/20/21 03/22/21  Terald Sleeperrifan, Zavia Pullen J, MD    Allergies    Fish-derived products, Zoloft [sertraline hcl], Sulfa antibiotics,  and Sulfamethizole  Review of Systems   Review of Systems  Constitutional:  Negative for chills and fever.  Eyes:  Negative for pain and visual disturbance.  Respiratory:  Positive for shortness of breath. Negative for cough.   Cardiovascular:  Positive for chest pain and leg swelling. Negative for palpitations.  Gastrointestinal:  Negative for abdominal pain and vomiting.  Genitourinary:  Negative for dysuria and hematuria.  Musculoskeletal:  Negative for arthralgias and back pain.  Skin:  Negative for color change and rash.  Neurological:  Negative for syncope and headaches.  All other systems reviewed and are negative.  Physical Exam Updated Vital Signs BP (!) 155/85  Pulse 84   Temp 98.3 F (36.8 C) (Oral)   Resp 20   Ht 6\' 1"  (1.854 m)   Wt 99.8 kg   SpO2 94%   BMI 29.03 kg/m   Physical Exam Constitutional:      General: He is not in acute distress. HENT:     Head: Normocephalic and atraumatic.  Eyes:     Conjunctiva/sclera: Conjunctivae normal.     Pupils: Pupils are equal, round, and reactive to light.  Cardiovascular:     Rate and Rhythm: Regular rhythm. Tachycardia present.  Pulmonary:     Effort: Pulmonary effort is normal. No respiratory distress.     Comments: 94% on room air Crackles in lung bases Abdominal:     General: There is no distension.     Tenderness: There is no abdominal tenderness.  Musculoskeletal:     Right lower leg: Edema present.     Left lower leg: Edema present.  Skin:    General: Skin is warm and dry.  Neurological:     General: No focal deficit present.     Mental Status: He is alert. Mental status is at baseline.  Psychiatric:        Mood and Affect: Mood normal.        Behavior: Behavior normal.    ED Results / Procedures / Treatments   Labs (all labs ordered are listed, but only abnormal results are displayed) Labs Reviewed  BASIC METABOLIC PANEL - Abnormal; Notable for the following components:      Result Value    CO2 20 (*)    Calcium 8.3 (*)    All other components within normal limits  HEPATIC FUNCTION PANEL - Abnormal; Notable for the following components:   Total Protein 5.8 (*)    Albumin 3.4 (*)    All other components within normal limits  ETHANOL - Abnormal; Notable for the following components:   Alcohol, Ethyl (B) 232 (*)    All other components within normal limits  RESP PANEL BY RT-PCR (FLU A&B, COVID) ARPGX2  CBC WITH DIFFERENTIAL/PLATELET  BRAIN NATRIURETIC PEPTIDE  TROPONIN I (HIGH SENSITIVITY)  TROPONIN I (HIGH SENSITIVITY)    EKG EKG Interpretation  Date/Time:  Monday February 20 2021 07:38:26 EDT Ventricular Rate:  111 PR Interval:  179 QRS Duration: 96 QT Interval:  340 QTC Calculation: 462 R Axis:   60 Text Interpretation: Sinus tachycardia Confirmed by 06-14-1995 [phone]) on 02/20/2021 7:52:29 AM  Radiology DG Chest Portable 1 View  Result Date: 02/20/2021 CLINICAL DATA:  Short of breath. Feet swelling. Unable to take fluid pills over the last 3 days. EXAM: PORTABLE CHEST 1 VIEW COMPARISON:  12/29/2020 and older exams. FINDINGS: Cardiac silhouette is normal in size. No mediastinal or hilar masses. Lung volumes are low. There is opacity at the right lung base consistent with atelectasis, similar to the prior exam. Remainder of the lungs is clear. No convincing pleural effusion.  No pneumothorax. Skeletal structures are grossly intact. IMPRESSION: No acute cardiopulmonary disease. Electronically Signed   By: 02/28/2021 M.D.   On: 02/20/2021 08:08    Procedures .Critical Care  Date/Time: 02/20/2021 4:17 PM Performed by: 04/23/2021, MD Authorized by: Terald Sleeper, MD   Critical care provider statement:    Critical care time (minutes):  45   Critical care was necessary to treat or prevent imminent or life-threatening deterioration of the following conditions:  Cardiac failure   Critical care was time spent personally by me  on the following activities:   Discussions with consultants, evaluation of patient's response to treatment, examination of patient, ordering and performing treatments and interventions, ordering and review of laboratory studies, ordering and review of radiographic studies, pulse oximetry, re-evaluation of patient's condition, obtaining history from patient or surrogate and review of old charts   Medications Ordered in ED Medications  clopidogrel (PLAVIX) tablet 75 mg (75 mg Oral Given 02/20/21 1310)  aspirin chewable tablet 81 mg (81 mg Oral Given 02/20/21 1310)  metoprolol tartrate (LOPRESSOR) tablet 25 mg (25 mg Oral Given 02/20/21 1310)  pantoprazole (PROTONIX) EC tablet 40 mg (40 mg Oral Given 02/20/21 1309)  spironolactone (ALDACTONE) tablet 25 mg (25 mg Oral Given 02/20/21 1309)  LORazepam (ATIVAN) injection 0-4 mg (0 mg Intravenous Not Given 02/20/21 1337)    Or  LORazepam (ATIVAN) tablet 0-4 mg ( Oral See Alternative 02/20/21 1337)  LORazepam (ATIVAN) injection 0-4 mg (has no administration in time range)    Or  LORazepam (ATIVAN) tablet 0-4 mg (has no administration in time range)  thiamine tablet 100 mg (100 mg Oral Given 02/20/21 1426)    Or  thiamine (B-1) injection 100 mg ( Intravenous See Alternative 02/20/21 1426)  acetaminophen (TYLENOL) tablet 650 mg (has no administration in time range)  pravastatin (PRAVACHOL) tablet 40 mg (has no administration in time range)  furosemide (LASIX) injection 80 mg (80 mg Intravenous Given 02/20/21 1610)    ED Course  I have reviewed the triage vital signs and the nursing notes.  Pertinent labs & imaging results that were available during my care of the patient were reviewed by me and considered in my medical decision making (see chart for details).  This patient complains of dyspnea, edema. This involves an extensive number of treatment options, and is a complaint that carries with it a high risk of complications and morbidity.  The differential diagnosis includes chf exacerbation most  likely in setting of medical noncompliance, less likely PE,ACS, PNA, or PTX  I ordered, reviewed, and interpreted labs.  No life-threatening abnormalities were noted on these tests.  Trops are flat, ECG without acute ischemic changes.  Etoh elevated at 232.  CBC normal I ordered Lasix for diuresis and home medications I ordered imaging studies which included dg chest I independently visualized and interpreted imaging which showed no life-threatening abnormalities, some likely pulmonary edema, and the monitor tracing which showed NSR/sinus tachycardia  The patient diuresed and filled 3-4 bedside containers with urine, about 2-3L output, feeling much more comfortable.  No hypoxia.  No immediate indication for hospitalization.  I suspect the majority of these issues are related to medical noncompliance  We contacted his friend from home who lives in [facility] and she stated she was only giving him potassium, that his other meds had expired, and he refuses consistently to take his meds.  She reports he drinks etoh heavy daily.  SW consulted for assistance with meds - see ED course below.  Unfortunately with pharmacies closed today we will have to board him overnight until meds can be provided, 30 day supply.  We'll try to help with home health as well.  I stressed the importance of medical compliance to him.  It's not clear to me that his situation is related to parkinson's - his tremors "worsened" here after a period of observation, and I suspect this is more likely related to alcohol withdrawal. He's been off his levadopa for many days, and only became tremulous after being in the ED for  some time.  We'll start him on a CIWA protocol with his drinking.  Clinical Course as of 02/20/21 1620  Mon Feb 20, 2021  1006 Alcohol, Ethyl (B)(!): 232 [MT]  1006 Initial troponin and BMP are unremarkable.  LFTs look overall okay.  We will diurese him again with some medications.  Alcohol is elevated [MT]   1335 I did discussion with her social worker Ronnie Derby, who reports that they would be able to fill the patient's prescriptions at our pharmacy but this can only happen tomorrow, as the pharmacy is closed today.  Unfortunate the patient lives by himself, and currently has no caretaker, no family, no other means to get to a pharmacy.  He does not drive.  He reports that he is "stranded in my house".  We will board him overnight in the ED and I will send prescriptions to her pharmacy, which will be provided tomorrow.  I have also placed a order for home health assistance.  He can get home health aides to help him with his medications at home, as noncompliance has been an issue.  He will also need help arranging for PCP appointments at the [facility] in [place] . [MT]  1336 I suspect the developing tachycardia and mild tremulousness may be related to alcohol withdrawal.  I placed him on the CIWA protocol.  He tells me candidly he is not interested in quitting drinking at this time. [MT]    Clinical Course User Index [MT] Diani Jillson, Kermit Balo, MD    Final Clinical Impression(s) / ED Diagnoses Final diagnoses:  Congestive heart failure, unspecified HF chronicity, unspecified heart failure type [facility])    Rx / DC Orders ED Discharge Orders          Ordered    carbidopa-levodopa (SINEMET IR) 25-100 MG tablet  3 times daily        02/20/21 1339    metoprolol tartrate (LOPRESSOR) 25 MG tablet  2 times daily        02/20/21 1339    carvedilol (COREG) 6.25 MG tablet  2 times daily with meals        02/20/21 1339    omeprazole (PRILOSEC) 20 MG capsule  Daily PRN        02/20/21 1339    potassium chloride SA (KLOR-CON) 20 MEQ tablet  Daily        02/20/21 1339    spironolactone (ALDACTONE) 25 MG tablet  Daily        02/20/21 1339    thiamine (VITAMIN B-1) 100 MG tablet  Daily        02/20/21 1339    empagliflozin (JARDIANCE) 25 MG TABS tablet  Daily        02/20/21 1339    furosemide (LASIX) 40 MG  tablet  Daily        02/20/21 1341             Terald Sleeper, MD 02/20/21 1620

## 2021-02-20 NOTE — Discharge Instructions (Addendum)
A 30-day supply of your medications was provided to you in the ER.  We cannot provide more long-term prescriptions.  It is vitally important that you call the primary care doctor who sees you at the [facility] hospital and arrange for a follow-up appointment.  This doctor should be represcribing your medicines, and it is important you take them as prescribed.  You have many serious medical problems including congestive heart failure, and if you go off your medications you put yourself at high risk for medical emergencies or death.  Our social worker will reach out to you about setting up home health.

## 2021-02-21 ENCOUNTER — Other Ambulatory Visit (HOSPITAL_COMMUNITY): Payer: Self-pay

## 2021-02-21 NOTE — ED Provider Notes (Signed)
Emergency Medicine Observation Re-evaluation Note  Raymond White is a 70 y.o. male, seen on rounds today.  Pt initially presented to the ED for complaints of Shortness of Breath Currently, the patient is resting in bed, awake.  Physical Exam  BP (!) 154/85 (BP Location: Left Arm)   Pulse 81   Temp 98.4 F (36.9 C) (Oral)   Resp (!) 22   Ht 6\' 1"  (1.854 m)   Wt 99.8 kg   SpO2 94%   BMI 29.03 kg/m  Physical Exam General: calm, no acute distress Cardiac: warm and well perfused Lungs: even and unlabored Psych: calm  ED Course / MDM  EKG:EKG Interpretation  Date/Time:  Monday February 20 2021 07:38:26 EDT Ventricular Rate:  111 PR Interval:  179 QRS Duration: 96 QT Interval:  340 QTC Calculation: 462 R Axis:   60 Text Interpretation: Sinus tachycardia Confirmed by 06-14-1995 [phone]) on 02/20/2021 7:52:29 AM Also confirmed by 04/23/2021 [phone]), editor [place], LaVerne ([place])  on 02/21/2021 7:50:54 AM  I have reviewed the labs performed to date as well as medications administered while in observation.  Recent changes in the last 24 hours include case management evaluated, has discussed meds with pharmacy and made arrangements to get all his meds prior to dc. Patient appears well, resting in bed in no distress. Able to ambulate independently. Vitals remain stable. Believe he is appopriate for discharge and out pt management.  Plan  Current plan is for discharge, f/u PCP this week.   04/24/2021, MD 02/21/21 [phone]

## 2021-02-21 NOTE — Discharge Planning (Signed)
Late entry:  RNCM called pt "caregiver,"  Lajean Silvius to gain collateral information.  Erskine Squibb states she has not officially cared for pt in years, but occasional checks on him.  Erskine Squibb states pt is a heavy drinker and does not take medications consistently.  Erskine Squibb states she was called by pt to help with medications by organizing his pill box but all of pt medications were expired.  When she tried to explain this to pt, he became angry and she left as she "knows how he is" when he is drinking. Erskine Squibb states the only medications in her possession that are not expired are potassium and gabapentin.    RNCM relayed information to EDP (Trifan) who agreed to a discharge plan of observing pt overnight (02/20/21), fill Rx in AM (02/21/21) and discharge pt.  RNCM/TOC will assist with co-pay.

## 2021-02-21 NOTE — ED Notes (Signed)
Placed Breakfast Order 

## 2021-02-21 NOTE — Discharge Planning (Signed)
RNCM consulted regarding medication assistance of pt unable to afford medications.  RNCM utilized [facility] petty cash to cover $42.00 co-pay and filled through [facility].  Rx e-scribed to [facility] and delivered to pt prior to discharge home.  No further RNCM needs identified at this time.   RNCM consulted regarding transportation needs for pt.  RNCM provided [facility] as pt has no transportation from hospital.  RNCM presented and explained [facility] Therapist, nutritional and Release of Liability Form.  Pt signed waiver, therefore agreeing to written terms.

## 2021-03-19 ENCOUNTER — Emergency Department (HOSPITAL_COMMUNITY)
Admission: EM | Admit: 2021-03-19 | Discharge: 2021-03-19 | Disposition: A | Discharge status: Home / Self Care | Payer: Medicare Other | Attending: Emergency Medicine | Admitting: Emergency Medicine

## 2021-03-19 ENCOUNTER — Other Ambulatory Visit: Payer: Self-pay

## 2021-03-19 ENCOUNTER — Emergency Department (HOSPITAL_COMMUNITY): Payer: Medicare Other

## 2021-03-19 ENCOUNTER — Encounter (HOSPITAL_COMMUNITY): Payer: Self-pay | Admitting: Emergency Medicine

## 2021-03-19 DIAGNOSIS — Y908 Blood alcohol level of 240 mg/100 ml or more: Secondary | ICD-10-CM | POA: Diagnosis not present

## 2021-03-19 DIAGNOSIS — R0602 Shortness of breath: Secondary | ICD-10-CM | POA: Insufficient documentation

## 2021-03-19 DIAGNOSIS — F10929 Alcohol use, unspecified with intoxication, unspecified: Secondary | ICD-10-CM | POA: Insufficient documentation

## 2021-03-19 DIAGNOSIS — R109 Unspecified abdominal pain: Secondary | ICD-10-CM | POA: Insufficient documentation

## 2021-03-19 DIAGNOSIS — I509 Heart failure, unspecified: Secondary | ICD-10-CM | POA: Diagnosis not present

## 2021-03-19 DIAGNOSIS — Z5321 Procedure and treatment not carried out due to patient leaving prior to being seen by health care provider: Secondary | ICD-10-CM | POA: Diagnosis not present

## 2021-03-19 DIAGNOSIS — R600 Localized edema: Secondary | ICD-10-CM | POA: Insufficient documentation

## 2021-03-19 DIAGNOSIS — R079 Chest pain, unspecified: Secondary | ICD-10-CM | POA: Insufficient documentation

## 2021-03-19 LAB — COMPREHENSIVE METABOLIC PANEL
ALT: 22 U/L (ref 0–44)
AST: 37 U/L (ref 15–41)
Albumin: 3.5 g/dL (ref 3.5–5.0)
Alkaline Phosphatase: 81 U/L (ref 38–126)
Anion gap: 13 (ref 5–15)
BUN: 9 mg/dL (ref 8–23)
CO2: 19 mmol/L — ABNORMAL LOW (ref 22–32)
Calcium: 8.3 mg/dL — ABNORMAL LOW (ref 8.9–10.3)
Chloride: 110 mmol/L (ref 98–111)
Creatinine, Ser: 0.91 mg/dL (ref 0.61–1.24)
GFR, Estimated: >60 mL/min (ref >60)
Glucose, Bld: 95 mg/dL (ref 70–99)
Potassium: 5 mmol/L (ref 3.5–5.1)
Sodium: 142 mmol/L (ref 135–145)
Total Bilirubin: 1 mg/dL (ref 0.3–1.2)
Total Protein: 5.8 g/dL — ABNORMAL LOW (ref 6.5–8.1)

## 2021-03-19 LAB — CBC WITH DIFFERENTIAL/PLATELET
Abs Immature Granulocytes: 0.03 10*3/uL (ref 0.00–0.07)
Basophils Absolute: 0 10*3/uL (ref 0.0–0.1)
Basophils Relative: 1 %
Eosinophils Absolute: 0.1 10*3/uL (ref 0.0–0.5)
Eosinophils Relative: 1 %
HCT: 44.1 % (ref 39.0–52.0)
Hemoglobin: 14.6 g/dL (ref 13.0–17.0)
Immature Granulocytes: 1 %
Lymphocytes Relative: 24 %
Lymphs Abs: 1.2 10*3/uL (ref 0.7–4.0)
MCH: 33.6 pg (ref 26.0–34.0)
MCHC: 33.1 g/dL (ref 30.0–36.0)
MCV: 101.4 fL — ABNORMAL HIGH (ref 80.0–100.0)
Monocytes Absolute: 0.3 10*3/uL (ref 0.1–1.0)
Monocytes Relative: 6 %
Neutro Abs: 3.4 10*3/uL (ref 1.7–7.7)
Neutrophils Relative %: 67 %
Platelets: 200 10*3/uL (ref 150–400)
RBC: 4.35 MIL/uL (ref 4.22–5.81)
RDW: 14.1 % (ref 11.5–15.5)
WBC: 4.9 10*3/uL (ref 4.0–10.5)
nRBC: 0 % (ref 0.0–0.2)

## 2021-03-19 LAB — TROPONIN I (HIGH SENSITIVITY): Troponin I (High Sensitivity): 7 ng/L (ref <18)

## 2021-03-19 LAB — BRAIN NATRIURETIC PEPTIDE: B Natriuretic Peptide: 50.7 pg/mL (ref 0.0–100.0)

## 2021-03-19 LAB — ETHANOL: Alcohol, Ethyl (B): 285 mg/dL — ABNORMAL HIGH (ref <10)

## 2021-03-19 LAB — LIPASE, BLOOD: Lipase: 27 U/L (ref 11–51)

## 2021-03-19 NOTE — ED Provider Notes (Signed)
Emergency Medicine Provider Triage Evaluation Note  Raymond White , a 70 y.o. male  was evaluated in triage.  Pt complains of LE edema. Not taking home diuretic. Sleeping with 5 pillows at night. Some chest tightness. No back pain, emesis. Has some abd pain when bending over, Trying to get into rehab in [place] for Etoh. No SI, HI, AVH  Review of Systems  Positive: LE edema, SOB Negative: Fever, emesis, back pain  Physical Exam  There were no vitals taken for this visit. Gen:   Awake, no distress   Resp:  Normal effort  MSK:   Moves extremities without difficulty  ABD:  Soft non tender Other:  Edema to BLE  Medical Decision Making  Medically screening exam initiated at 1:45 PM.  Appropriate orders placed.  Raymond White was informed that the remainder of the evaluation will be completed by another provider, this initial triage assessment does not replace that evaluation, and the importance of remaining in the ED until their evaluation is complete.  SOB, LE edema   Trivia Heffelfinger A, PA-C 03/19/21 1345    Terald Sleeper, MD 03/20/21 [phone]

## 2021-03-19 NOTE — ED Triage Notes (Addendum)
Pt to triage via [facility] from home.  Reports BLE edema, SOB, abd pan, and L sided chest pain x 2 days.  Hx of CHF.  States he is trying to get in [place] [facility] [place] is assisting him.  Heavy ETOH.

## 2021-03-19 NOTE — ED Notes (Signed)
Pt states he is leaving at this time. Not going to wait to be seen. Pulling OTF

## 2022-04-29 IMAGING — CT CT CERVICAL SPINE W/O CM
3 of 4 series · 12 of 33 positions shown, 14 images · non-contrast
Comparison: None.

CLINICAL DATA: Minor head trauma

EXAM:
CT HEAD WITHOUT CONTRAST
CT CERVICAL SPINE WITHOUT CONTRAST
TECHNIQUE: Multidetector CT imaging of the head and cervical spine was
performed following the standard protocol without intravenous
contrast. Multiplanar CT image reconstructions of the cervical spine
were also generated.

[Series 4: c_spine 2.0 st · axial · 0.29mm/px · z∈[-249,-113]mm · 4 of 104 slices shown, 5 images]
[im 18/104  soft-tissue]
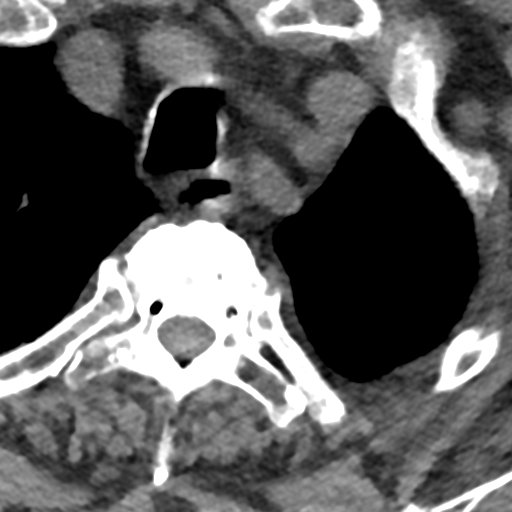
[im 18/104  bone]
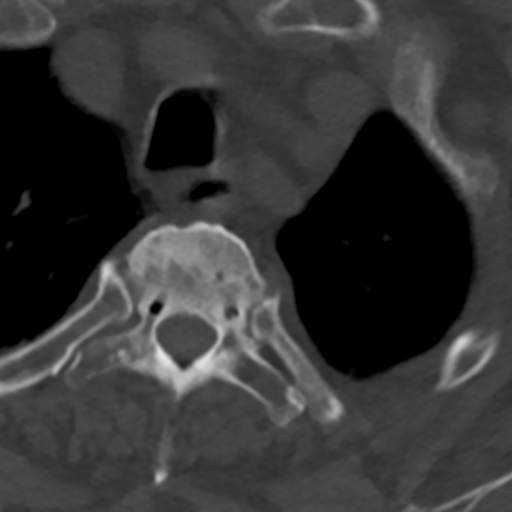
[im 35/104  bone]
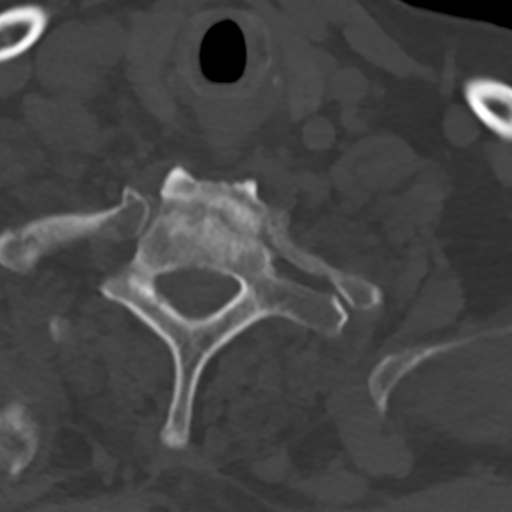
[im 69/104  bone]
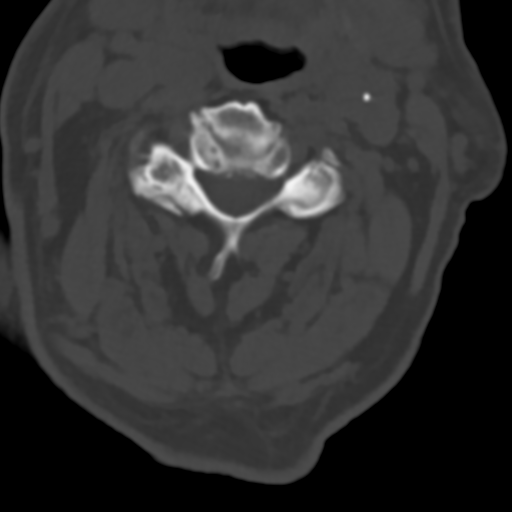
[im 86/104  bone]
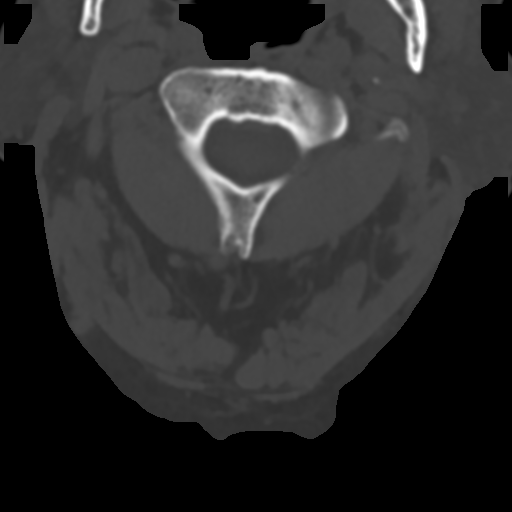

[Series 7: coronal bone · coronal · 0.23mm/px · 3 of 61 slices shown]
[im 13/61  bone]
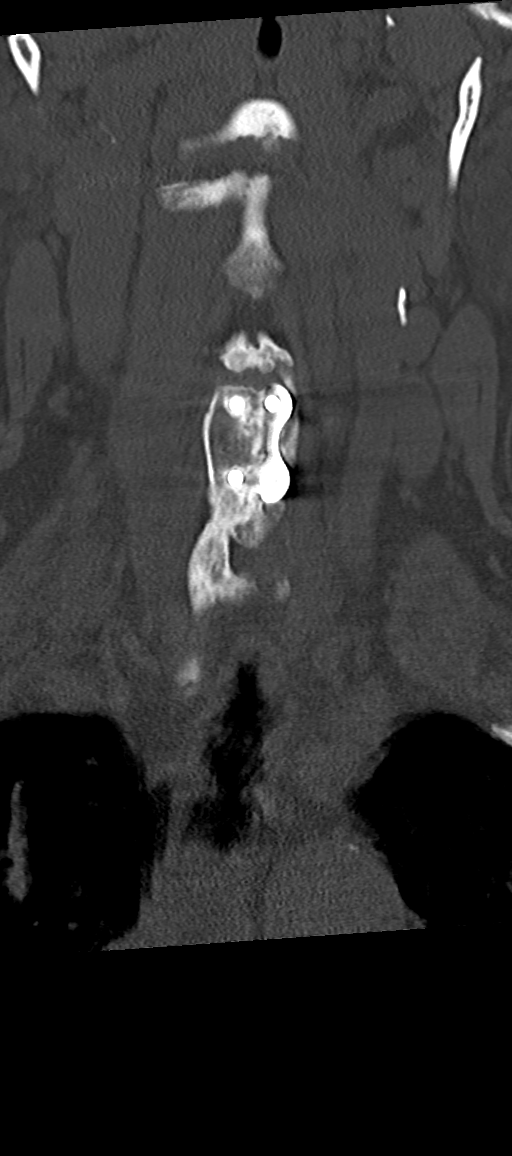
[im 25/61  bone]
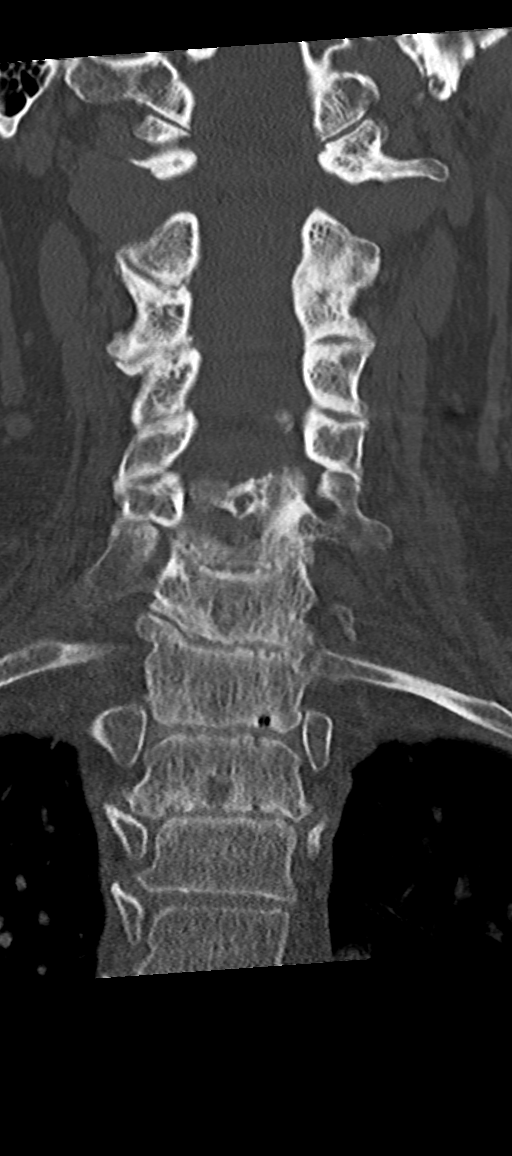
[im 37/61  bone]
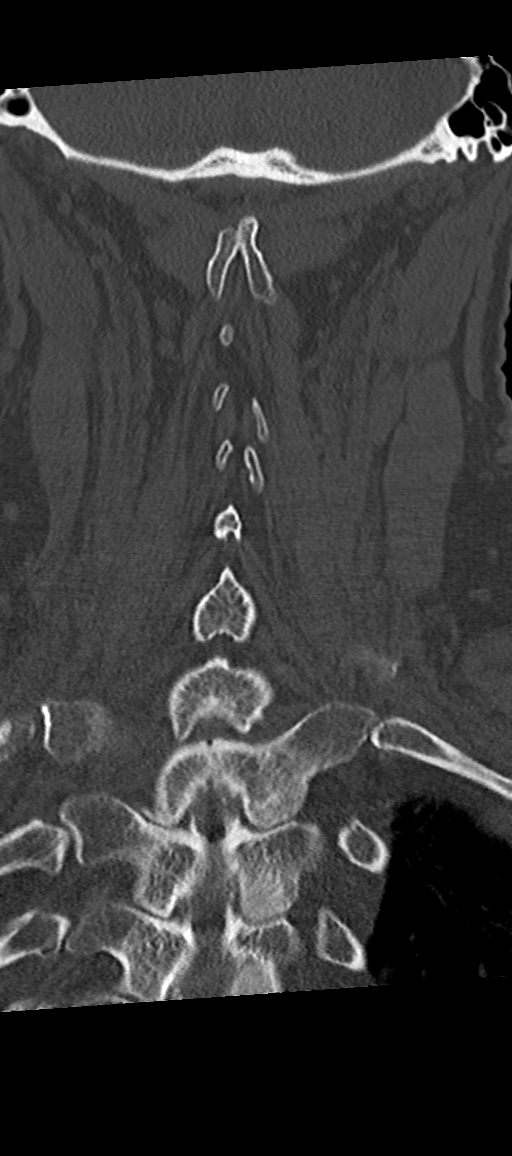

[Series 12: sagittal bone · sagittal · 0.40mm/px · 5 of 77 slices shown, 6 images]
[im 26/77  bone]
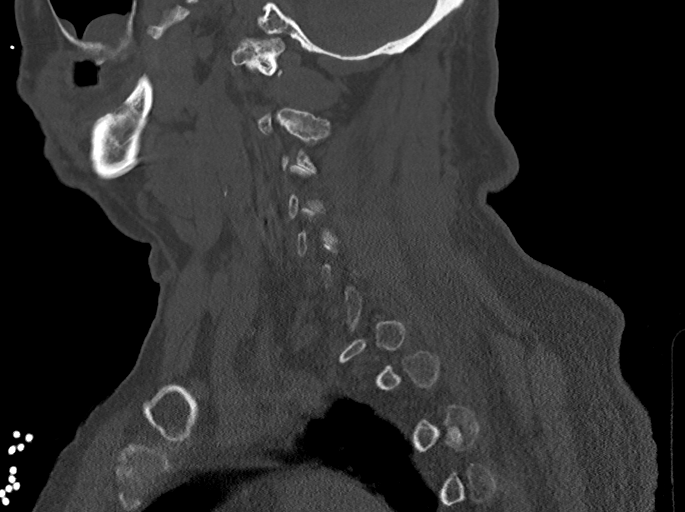
[im 32/77  bone]
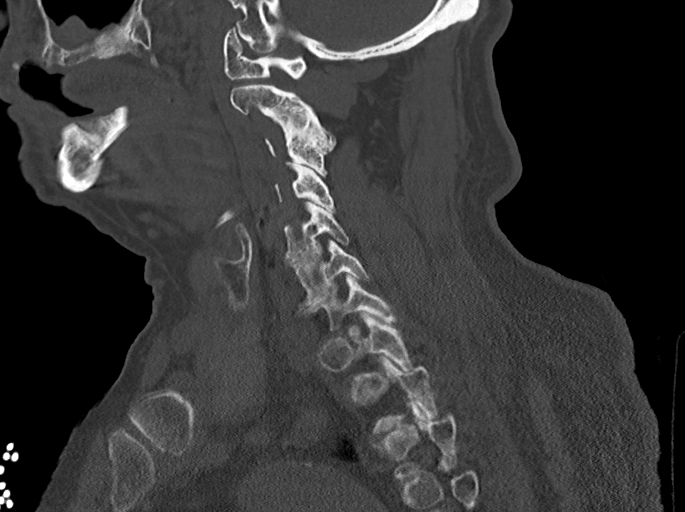
[im 39/77  soft-tissue]
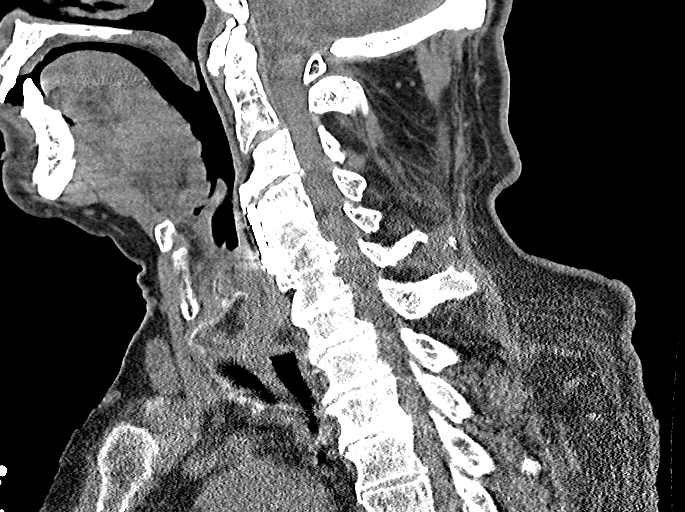
[im 39/77  bone]
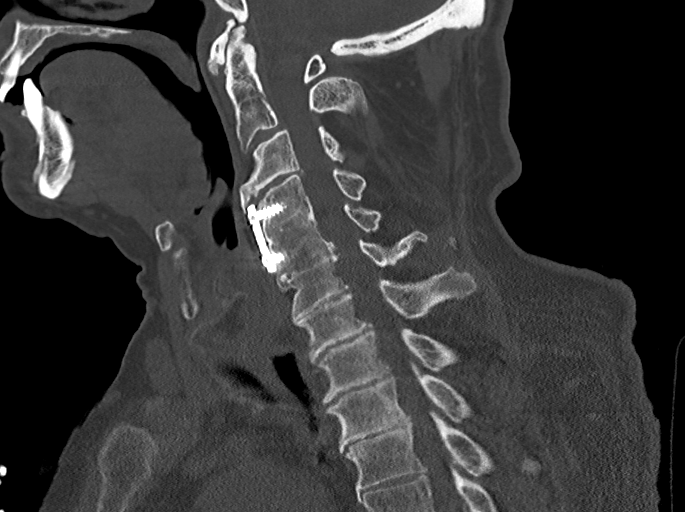
[im 45/77  bone]
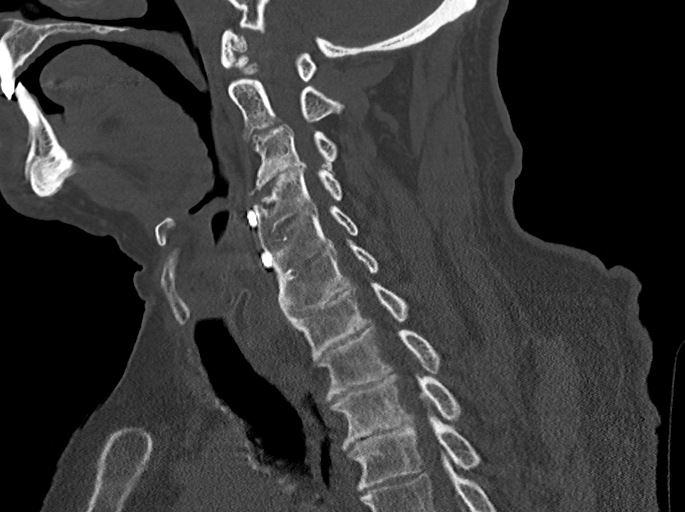
[im 51/77  bone]
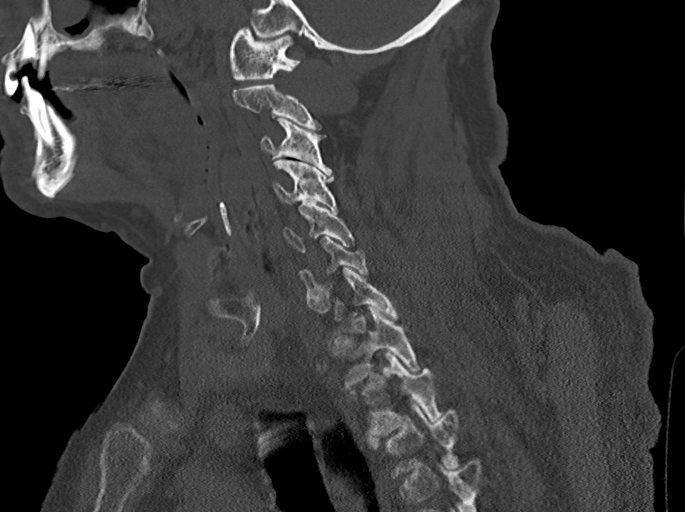

[12 of 33 positions shown; findings below may reference images not displayed]

FINDINGS: CT HEAD FINDINGS

Brain: No evidence of acute infarction, hemorrhage, hydrocephalus,
extra-axial collection or mass lesion/mass effect. Generalized
atrophy.

Vascular: No hyperdense vessel or unexpected calcification.

Skull: Normal. Negative for fracture or focal lesion.

Sinuses/Orbits: No acute finding.

CT CERVICAL SPINE FINDINGS

Alignment: Straightening of the cervical spine.

Skull base and vertebrae: No acute fracture or focal bone lesion.

Soft tissues and spinal canal: No prevertebral fluid or swelling. No
visible canal hematoma.

Disc levels: C4-5 and C5-6 solid arthrodesis. Disc degeneration is
diffuse and advanced. Advanced facet osteoarthritis at multiple
levels. There is facet ankylosis at C2-3 on the left.

Upper chest: No evidence of injury
IMPRESSION: No evidence of acute intracranial or cervical spine injury.

## 2022-04-29 IMAGING — CT CT HEAD W/O CM
3 series · 14 of 47 positions shown, 16 images · non-contrast
Comparison: None.

CLINICAL DATA: Minor head trauma

EXAM:
CT HEAD WITHOUT CONTRAST
CT CERVICAL SPINE WITHOUT CONTRAST
TECHNIQUE: Multidetector CT imaging of the head and cervical spine was
performed following the standard protocol without intravenous
contrast. Multiplanar CT image reconstructions of the cervical spine
were also generated.

[Series 3: head 5.0 h30s · axial · 0.52mm/px · z∈[-67,+78]mm · 8 of 35 slices shown, 10 images]
[im 3/35  brain]
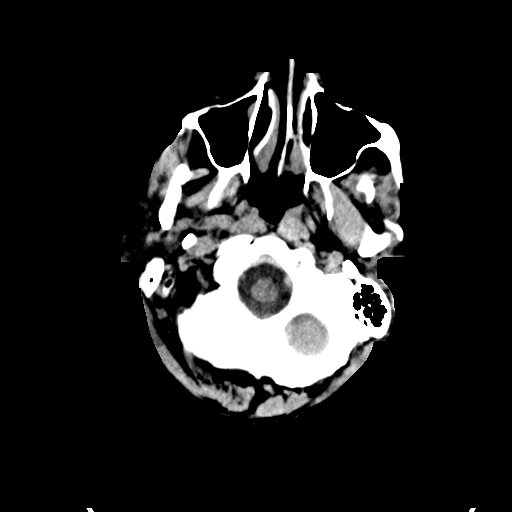
[im 3/35  bone]
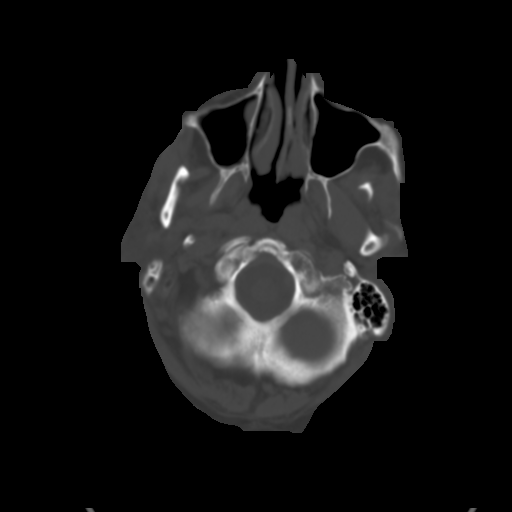
[im 8/35  brain]
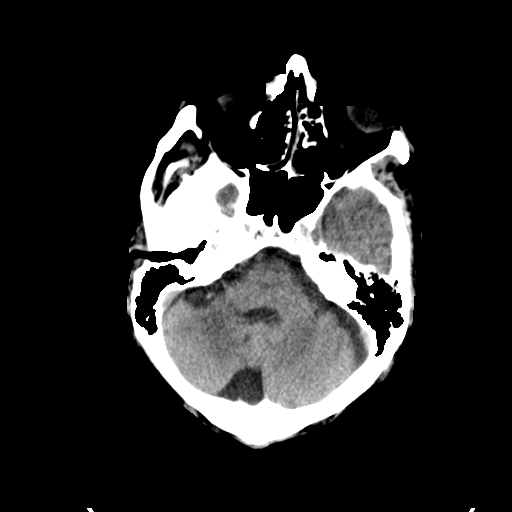
[im 11/35  brain]
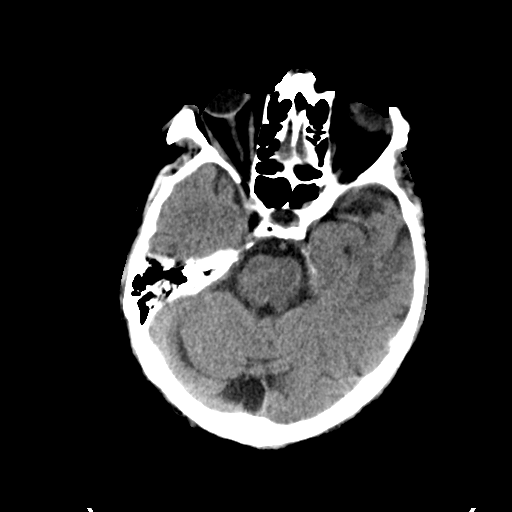
[im 16/35  brain]
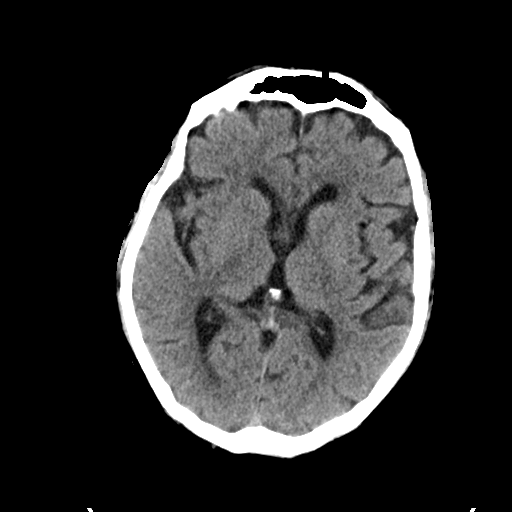
[im 19/35  brain]
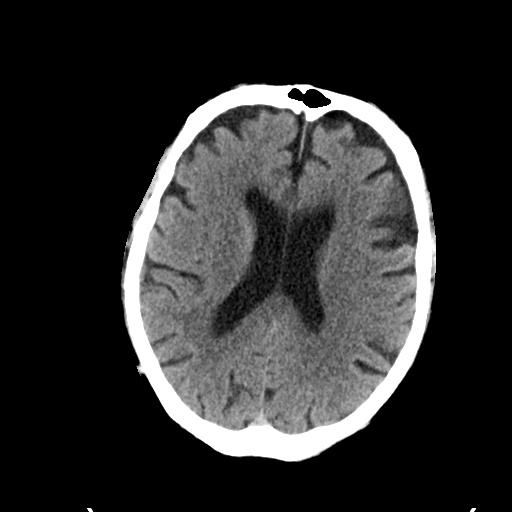
[im 19/35  bone]
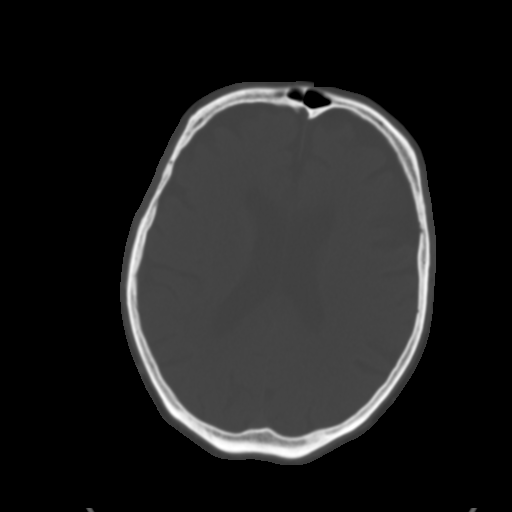
[im 24/35  brain]
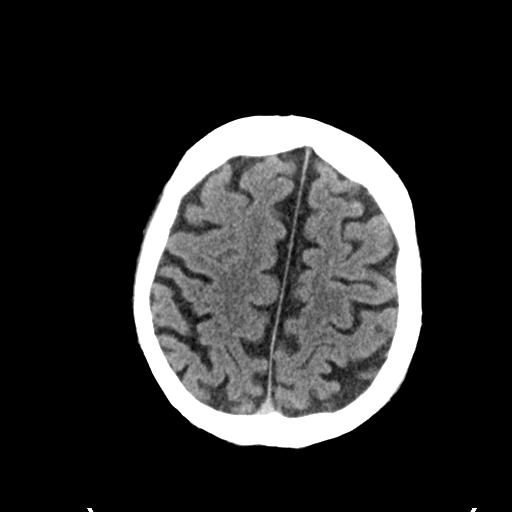
[im 27/35  brain]
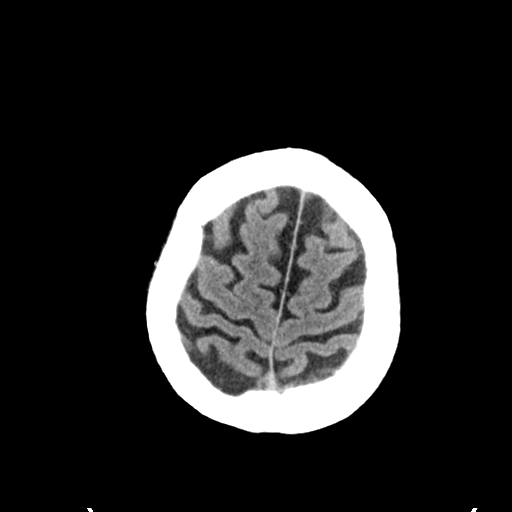
[im 32/35  brain]
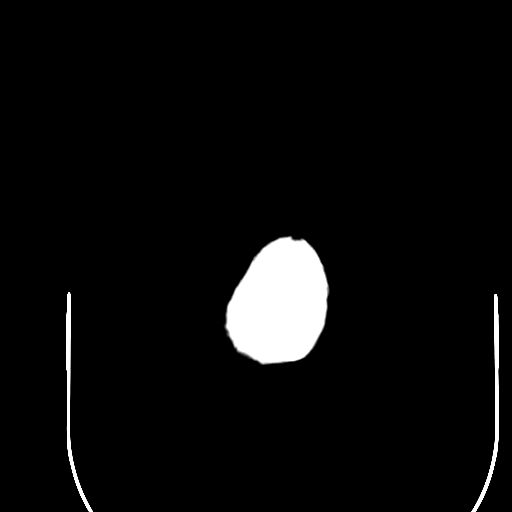

[Series 5: head 3.0 mpr cor · coronal · 0.34mm/px · 3 of 77 slices shown]
[im 26/77  brain]
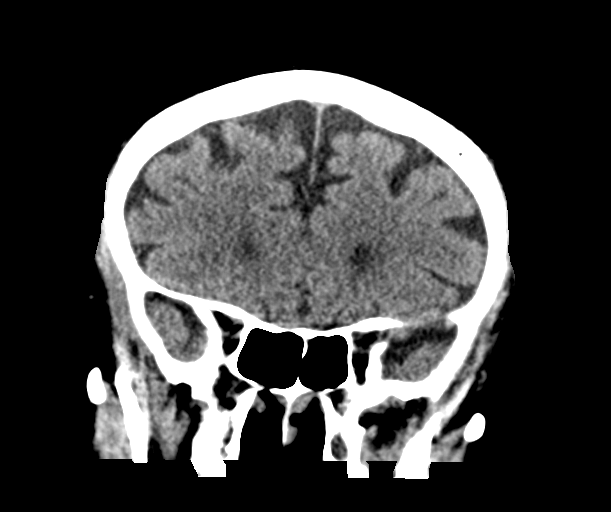
[im 34/77  brain]
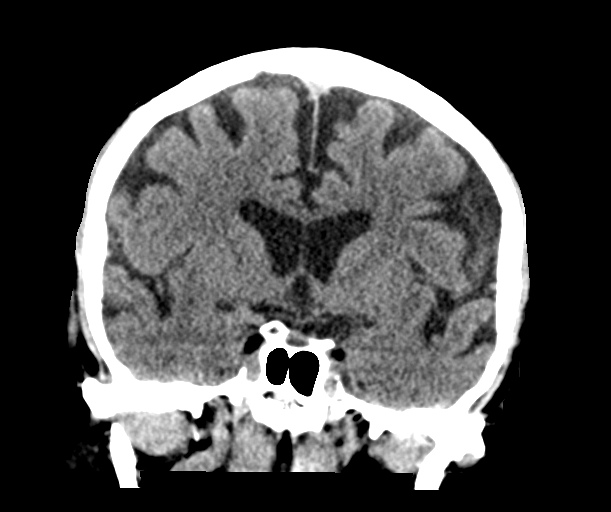
[im 43/77  brain]
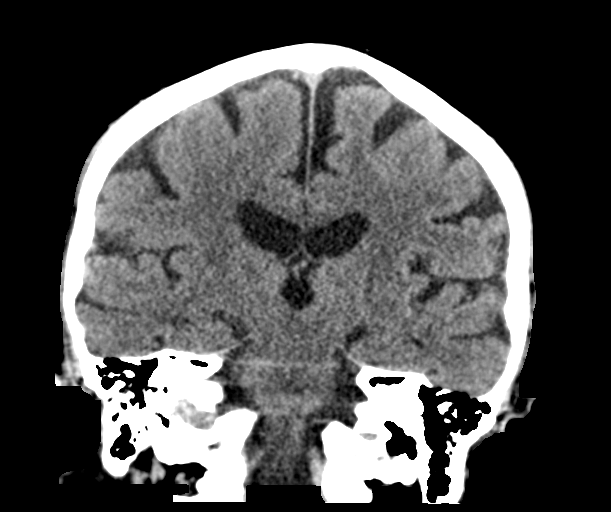

[Series 6: head 3.0 mpr sag · sagittal · 0.33mm/px · 3 of 67 slices shown]
[im 23/67  brain]
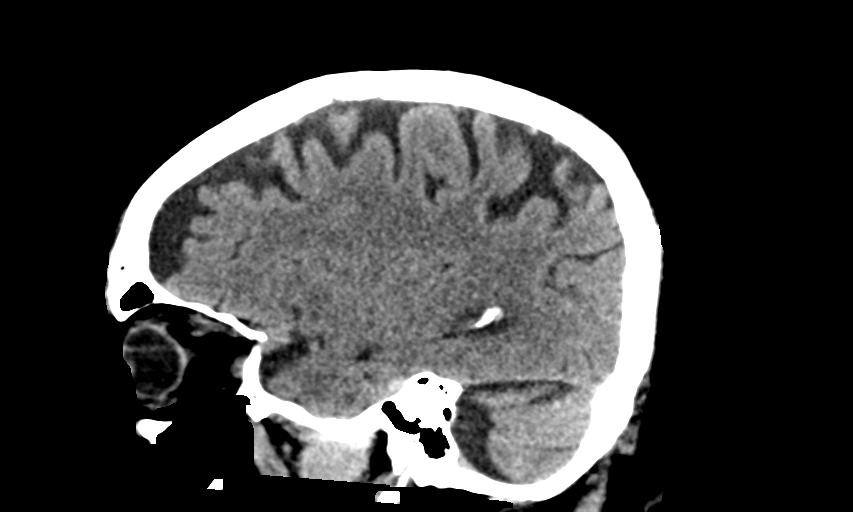
[im 34/67  brain]
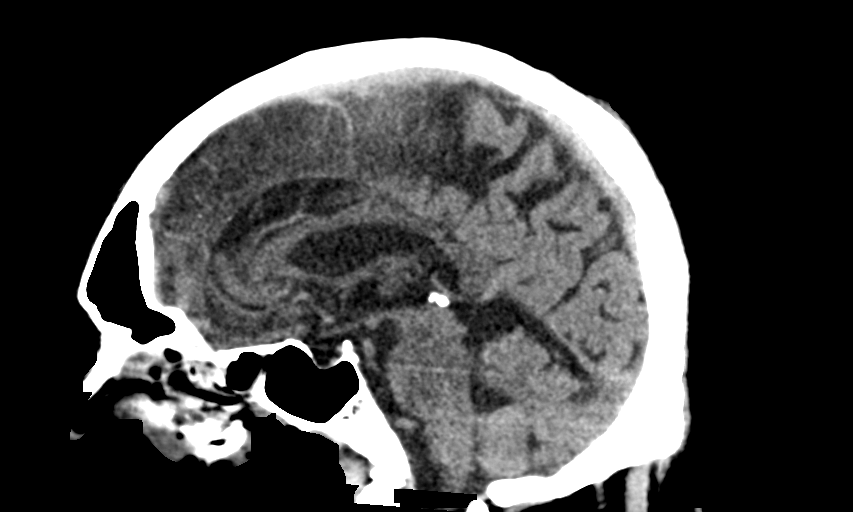
[im 45/67  brain]
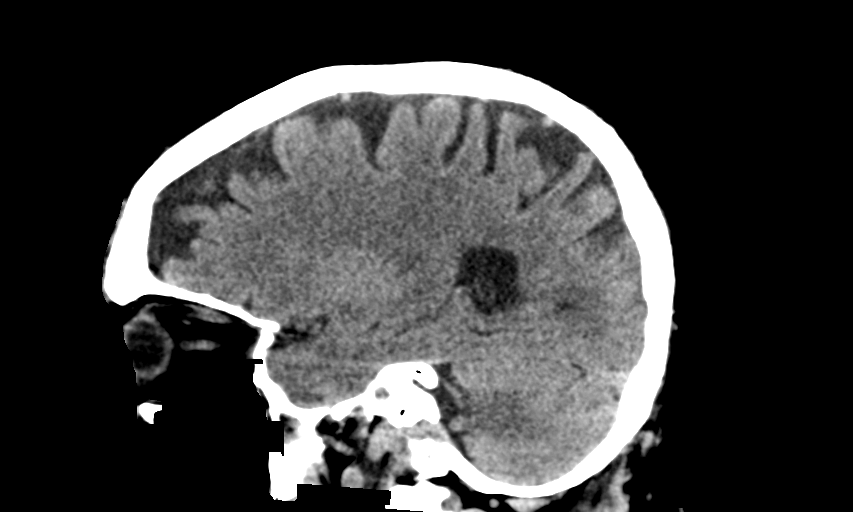

[14 of 47 positions shown; findings below may reference images not displayed]

FINDINGS: CT HEAD FINDINGS

Brain: No evidence of acute infarction, hemorrhage, hydrocephalus,
extra-axial collection or mass lesion/mass effect. Generalized
atrophy.

Vascular: No hyperdense vessel or unexpected calcification.

Skull: Normal. Negative for fracture or focal lesion.

Sinuses/Orbits: No acute finding.

CT CERVICAL SPINE FINDINGS

Alignment: Straightening of the cervical spine.

Skull base and vertebrae: No acute fracture or focal bone lesion.

Soft tissues and spinal canal: No prevertebral fluid or swelling. No
visible canal hematoma.

Disc levels: C4-5 and C5-6 solid arthrodesis. Disc degeneration is
diffuse and advanced. Advanced facet osteoarthritis at multiple
levels. There is facet ankylosis at C2-3 on the left.

Upper chest: No evidence of injury
IMPRESSION: No evidence of acute intracranial or cervical spine injury.

## 2022-04-29 IMAGING — CT CT CHEST-ABD-PELV W/ CM
2 of 5 series · 14 of 36 positions shown, 16 images · IV contrast (APPLIED)
Comparison: August 26, 2020

CLINICAL DATA: Fall down steps

EXAM:
CT CHEST, ABDOMEN, AND PELVIS WITH CONTRAST
TECHNIQUE: Multidetector CT imaging of the chest, abdomen and pelvis was
performed following the standard protocol during bolus
administration of intravenous contrast.
CONTRAST:  100mL OMNIPAQUE IOHEXOL 300 MG/ML  SOLN

[Series 3: cap 5.0 i31f 2 · axial · 0.80mm/px · z∈[-816,-266]mm · 11 of 132 slices shown, 13 images]
[im 11/132  mediastinal]
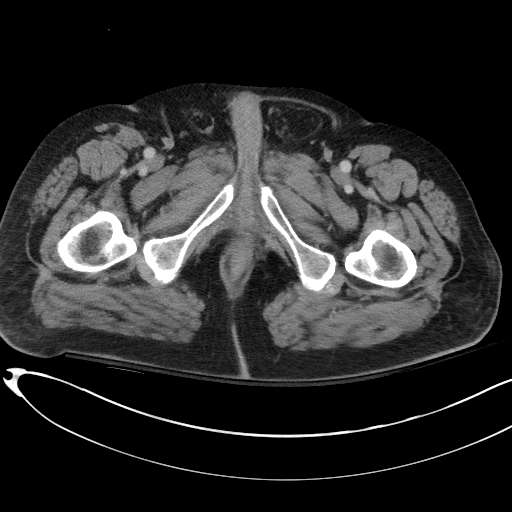
[im 11/132  bone]
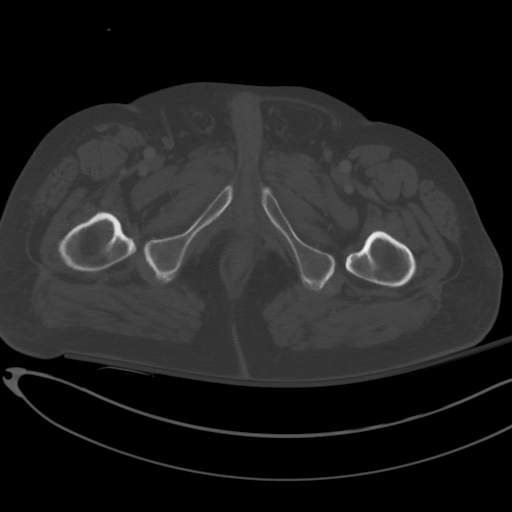
[im 22/132  mediastinal]
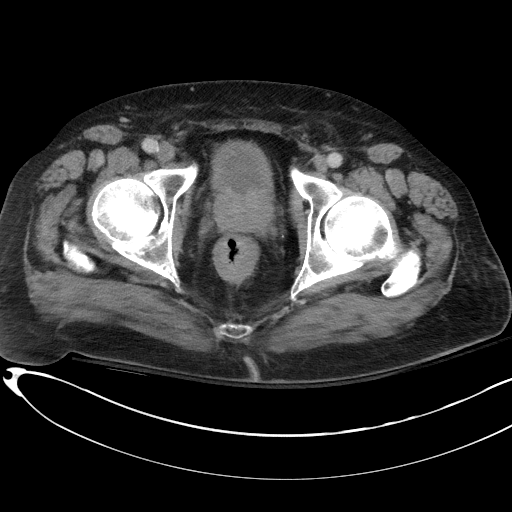
[im 33/132  mediastinal]
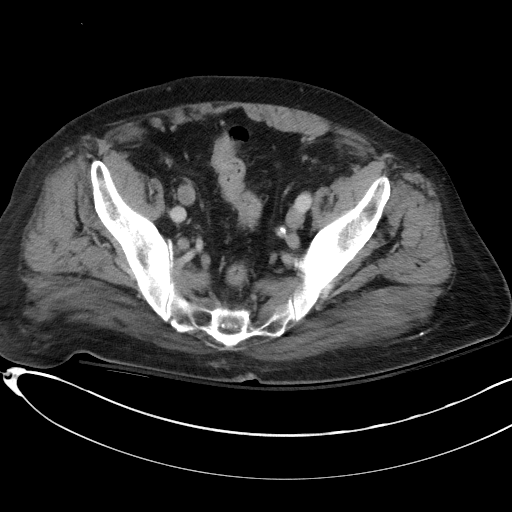
[im 44/132  mediastinal]
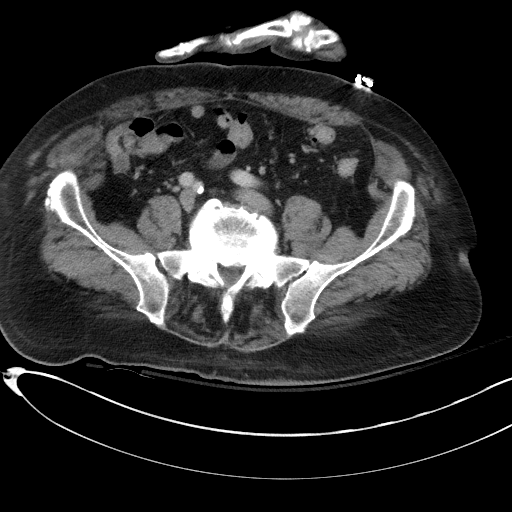
[im 55/132  mediastinal]
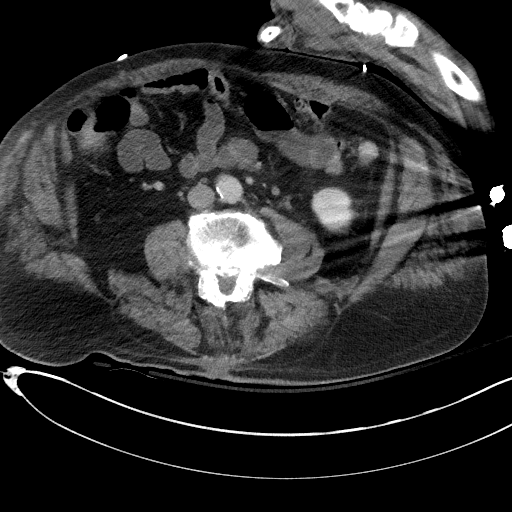
[im 66/132  mediastinal]
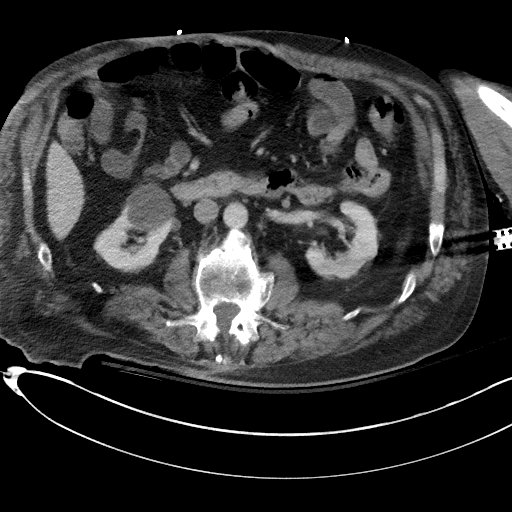
[im 77/132  mediastinal]
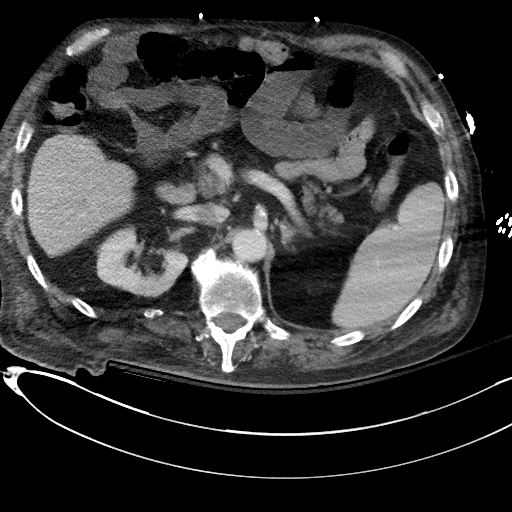
[im 88/132  mediastinal]
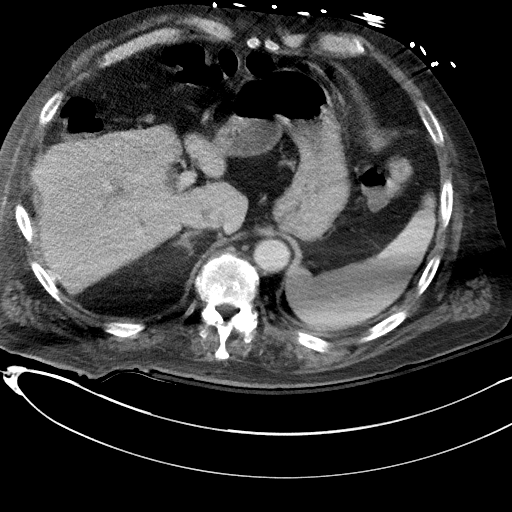
[im 99/132  mediastinal]
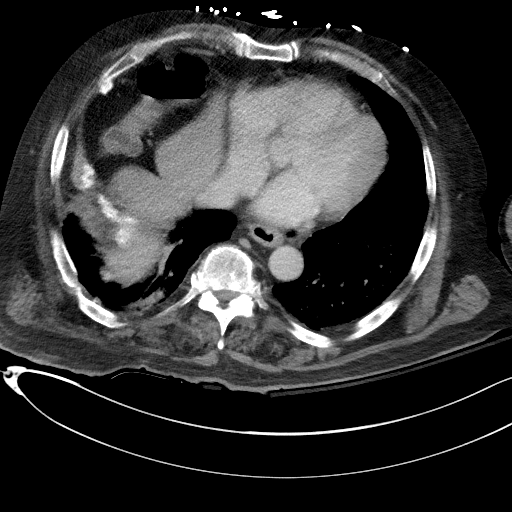
[im 99/132  bone]
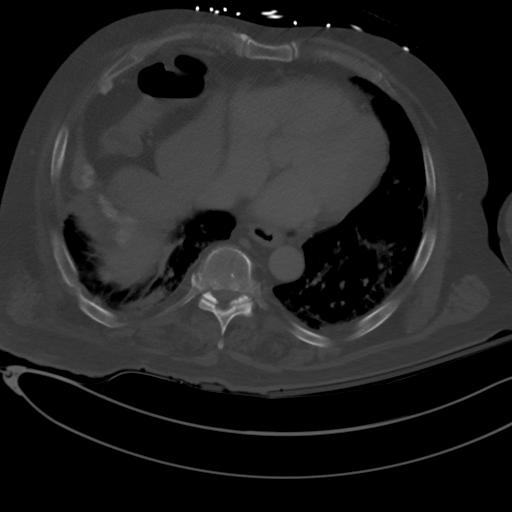
[im 110/132  mediastinal]
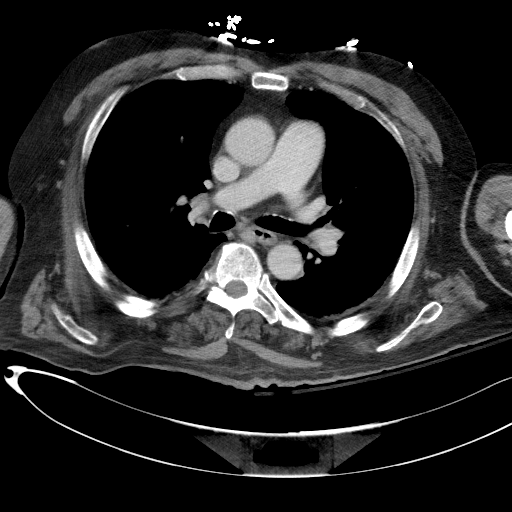
[im 121/132  mediastinal]
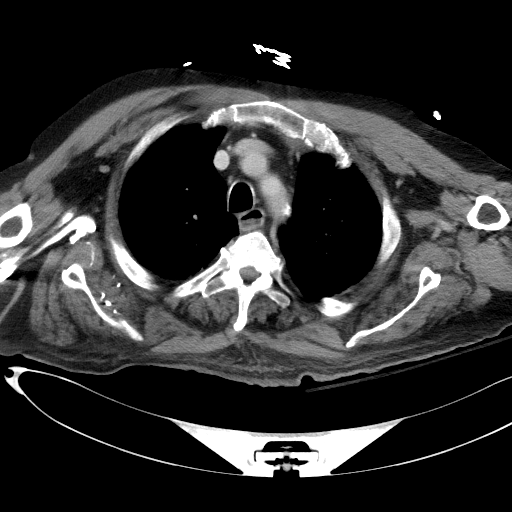

[Series 6: coronal · coronal · 0.88mm/px · 3 of 187 slices shown]
[im 38/187  mediastinal]
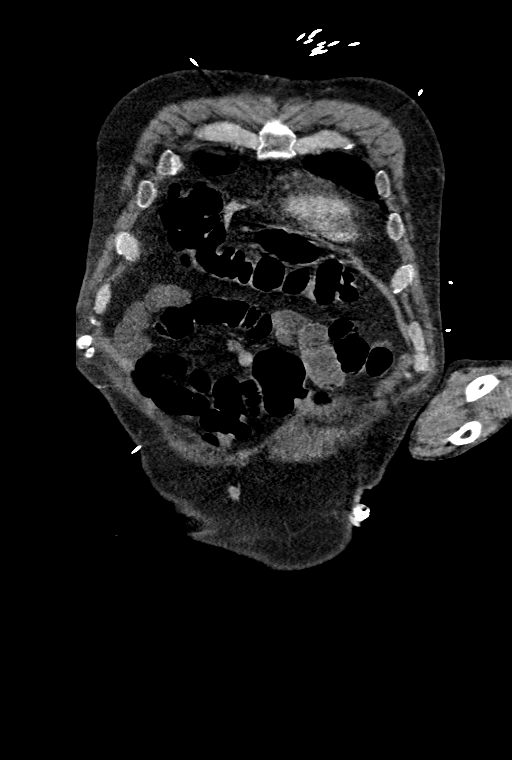
[im 75/187  mediastinal]
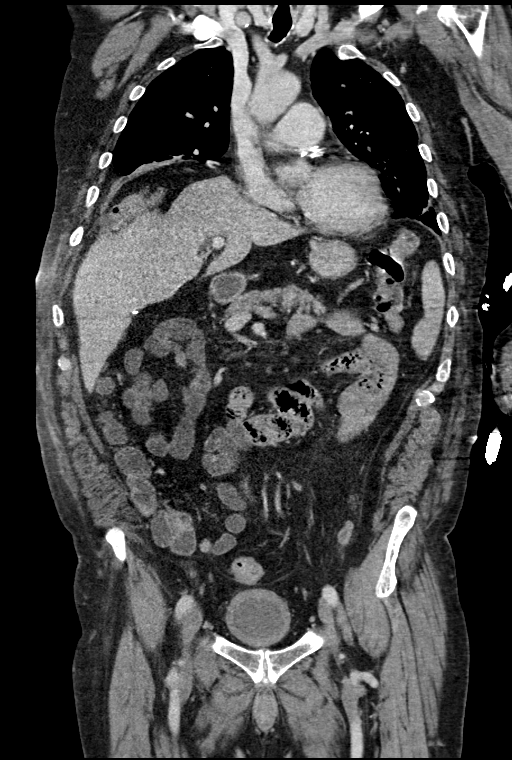
[im 112/187  mediastinal]
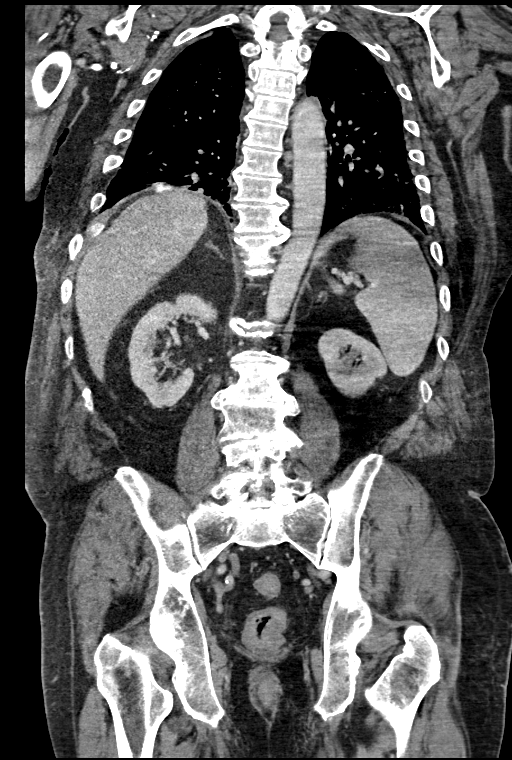

[14 of 36 positions shown; findings below may reference images not displayed]

FINDINGS: CT CHEST FINDINGS

Cardiovascular: No evidence of acute aortic injury with limited
evaluation of the aortic root secondary to cardiac motion.
LEFT-sided coronary artery atherosclerotic calcifications heart is
mildly enlarged. No pericardial effusion. Scattered atherosclerotic
calcifications throughout the aorta.

Mediastinum/Nodes: Thyroid is unremarkable. No suspicious axillary
or mediastinal lymph nodes.

Lungs/Pleura: No pleural effusion or pneumothorax. Revisualization
of elevation of the RIGHT hemidiaphragm with chronic calcific
densities and scarring overlying the RIGHT hemidiaphragm. There are
new peripheral predominant ground-glass opacities throughout
bilateral lungs.

Musculoskeletal: Remote RIGHT-sided rib fractures. There is fat
stranding overlying the RIGHT lateral soft tissues, partially
excluded from the field of view. No evidence of active contrast
extravasation. Multilevel degenerative changes of the thoracic spine
better assessed on contemporaneous dedicated CT.

CT ABDOMEN PELVIS FINDINGS

Hepatobiliary: Status post cholecystectomy. Unchanged central
biliary prominence, likely due to post cholecystectomy state. No
evidence of hepatic laceration.

Pancreas: No peripancreatic fat stranding.

Spleen: No splenic injury or perisplenic hematoma.

Adrenals/Urinary Tract: Adrenal glands are unremarkable. Kidneys
enhance symmetrically. No evidence of renal laceration. RIGHT-sided
renal cysts. Additional subcentimeter hypodense lesions are too
small to accurately characterize. Bladder is decompressed with
circumferentially thickened walls; this likely reflects a sequela of
chronic outlet obstruction.

Stomach/Bowel: Hiatal hernia with a LEFT-sided esophageal
diverticulum, unchanged. No evidence of bowel obstruction. Scattered
diverticulosis.

Vascular/Lymphatic: Mild atherosclerotic calcifications of the
aorta. No suspicious lymphadenopathy.

Reproductive: Prostate is present with a TURP defect.

Other: Fat containing LEFT inguinal hernia.  No free fluid.

Musculoskeletal: Multilevel degenerative changes of the lumbar spine
better assessed on contemporaneous CT.
IMPRESSION: 1. No evidence of solid organ or aortic injury. Fat stranding along
the RIGHT lateral chest wall and flank likely reflects
contusion/bruising.
2. Peripheral predominant ground-glass opacities likely reflect
atypical infection such as COVID 19 infection.
3. Please see separately dictated report regarding thoracolumbar
spine CT.

Aortic Atherosclerosis ([id number]).

## 2023-06-03 ENCOUNTER — Other Ambulatory Visit: Payer: Self-pay

## 2023-06-03 ENCOUNTER — Emergency Department (HOSPITAL_COMMUNITY): Payer: Medicare HMO

## 2023-06-03 ENCOUNTER — Emergency Department (HOSPITAL_COMMUNITY)
Admission: EM | Admit: 2023-06-03 | Discharge: 2023-06-03 | Disposition: A | Discharge status: Home / Self Care | Payer: Medicare HMO | Attending: Emergency Medicine | Admitting: Emergency Medicine

## 2023-06-03 DIAGNOSIS — K4090 Unilateral inguinal hernia, without obstruction or gangrene, not specified as recurrent: Secondary | ICD-10-CM | POA: Diagnosis not present

## 2023-06-03 DIAGNOSIS — R1030 Lower abdominal pain, unspecified: Secondary | ICD-10-CM | POA: Diagnosis present

## 2023-06-03 LAB — CBC WITH DIFFERENTIAL/PLATELET
Abs Immature Granulocytes: 0.02 10*3/uL (ref 0.00–0.07)
Basophils Absolute: 0 10*3/uL (ref 0.0–0.1)
Basophils Relative: 0 %
Eosinophils Absolute: 0 10*3/uL (ref 0.0–0.5)
Eosinophils Relative: 1 %
HCT: 44.2 % (ref 39.0–52.0)
Hemoglobin: 15.1 g/dL (ref 13.0–17.0)
Immature Granulocytes: 0 %
Lymphocytes Relative: 12 %
Lymphs Abs: 0.8 10*3/uL (ref 0.7–4.0)
MCH: 34.2 pg — ABNORMAL HIGH (ref 26.0–34.0)
MCHC: 34.2 g/dL (ref 30.0–36.0)
MCV: 100 fL (ref 80.0–100.0)
Monocytes Absolute: 0.5 10*3/uL (ref 0.1–1.0)
Monocytes Relative: 8 %
Neutro Abs: 5.4 10*3/uL (ref 1.7–7.7)
Neutrophils Relative %: 79 %
Platelets: 149 10*3/uL — ABNORMAL LOW (ref 150–400)
RBC: 4.42 MIL/uL (ref 4.22–5.81)
RDW: 12.3 % (ref 11.5–15.5)
WBC: 6.8 10*3/uL (ref 4.0–10.5)
nRBC: 0 % (ref 0.0–0.2)

## 2023-06-03 LAB — BASIC METABOLIC PANEL
Anion gap: 9 (ref 5–15)
BUN: 11 mg/dL (ref 8–23)
CO2: 24 mmol/L (ref 22–32)
Calcium: 8.8 mg/dL — ABNORMAL LOW (ref 8.9–10.3)
Chloride: 105 mmol/L (ref 98–111)
Creatinine, Ser: 0.76 mg/dL (ref 0.61–1.24)
GFR, Estimated: >60 mL/min (ref >60)
Glucose, Bld: 116 mg/dL — ABNORMAL HIGH (ref 70–99)
Potassium: 3.7 mmol/L (ref 3.5–5.1)
Sodium: 138 mmol/L (ref 135–145)

## 2023-06-03 MED ORDER — IOHEXOL 350 MG/ML SOLN
75.0000 mL | Freq: Once | INTRAVENOUS | Status: AC | PRN
  Administered 2023-06-03: 75 mL via INTRAVENOUS

## 2023-06-03 MED ORDER — MORPHINE SULFATE (PF) 4 MG/ML IV SOLN
4.0000 mg | Freq: Once | INTRAVENOUS | Status: AC
  Administered 2023-06-03: 4 mg via INTRAVENOUS
  Filled 2023-06-03: qty 1

## 2023-06-03 MED ORDER — OXYCODONE-ACETAMINOPHEN 5-325 MG PO TABS
1.0000 | ORAL_TABLET | Freq: Once | ORAL | Status: AC
  Administered 2023-06-03: 1 via ORAL
  Filled 2023-06-03: qty 1

## 2023-06-03 NOTE — ED Notes (Signed)
Requested pain medication from Dr. Denton Lank.

## 2023-06-03 NOTE — ED Triage Notes (Signed)
EMS stated, pt has abdominal pain, he was dx with a hernia left  side. He lifted something at work and started hurting. He did have some Nausea. Marland KitchenHernia is protruding.

## 2023-06-03 NOTE — ED Provider Triage Note (Signed)
Emergency Medicine Provider Triage Evaluation Note  TROYCE GIESKE , a 72 y.o. male  was evaluated in triage.  Pt complains of swelling in the left groin, recently diagnosed with hernia Review of Systems    Physical Exam  BP 134/73 (BP Location: Right Arm)   Pulse 76   Temp 97.9 F (36.6 C)   Resp 20   Ht 6\' 1"  (1.854 m)   Wt 95.3 kg   SpO2 95%   BMI 27.71 kg/m  Gen:   Awake, no distress   Resp:  Normal effort  MSK:   Moves extremities without difficulty  Other:  Soft abdomen, left inguinal hernia.  Medical Decision Making  Medically screening exam initiated at 10:43 AM.  Appropriate orders placed.  Prudencio E Hachey was informed that the remainder of the evaluation will be completed by another provider, this initial triage assessment does not replace that evaluation, and the importance of remaining in the ED until their evaluation is complete.  72 year old male here today with left hernia.  Recently had a hospitalization at [facility] for this hernia, was elected to be dealt with electively.  Patient was at work today, felt a sudden bulge in his left groin.  He has not been vomiting.  Analgesia ordered, imaging ordered.  In triage, unable to assess for incarceration, however patient looked quite comfortable.   Anders Simmonds T, DO 06/03/23 1044

## 2023-06-03 NOTE — Discharge Instructions (Addendum)
It was our pleasure to provide your ER care today - we hope that you feel better.  It appears from the documentation from your ER visit to [facility] 9 days ago that your surgeon was willing to fix your hernia as early as last week.  Contact your surgeon (or oursurgery office listed on this paperwork if you decide you want to use our surgeons) tomorrow AM to discuss when they can schedule your surgery.  Take acetaminophen or ibuprofen as need.   Return to ER if worse, new symptoms, fevers, new or worsening or severe abdominal pain, if hernia is persistently out and does not go back in and/or becomes persistently more swollen and painful, if recurrent/persistent vomiting, or other concern.   You were given pain medication in the ER - no driving for the next 8 hours.

## 2023-06-03 NOTE — ED Provider Notes (Signed)
[place] EMERGENCY DEPARTMENT AT [facility] Provider Note   CSN: [id number] Arrival date & time: 06/03/23  1030     History  Chief Complaint  Patient presents with   Abdominal Pain   Nausea    Raymond White is a 72 y.o. male.  Pt with hx inguinal hernia, c/o pain to left inguinal area. Recent eval in [facility] system for same. Pt indicates was told he did not require emergent surgery, and has not yet scheduled elective repair. Pt indicates area protrudes out intermittently and at times causes pain. No abd distension. Having normal bms. No vomiting. No fevers.   The history is provided by the patient, medical records and the EMS personnel. The history is limited by the condition of the patient.  Abdominal Pain Associated symptoms: no chest pain, no chills, no dysuria, no fever, no shortness of breath, no sore throat and no vomiting        Home Medications Prior to Admission medications   Medication Sig Start Date End Date Taking? Authorizing Provider  acetaminophen (TYLENOL) 500 MG tablet Take 500 mg by mouth every 6 (six) hours as needed for moderate pain. Patient not taking: No sig reported    [provider]  busPIRone (BUSPAR) 10 MG tablet Take 1 tablet (10 mg total) by mouth 3 (three) times daily. Patient not taking: Reported on 02/20/2021 08/31/20   Lanae Boast, MD  carbidopa-levodopa (SINEMET IR) 25-100 MG tablet Take 2.5 tablets by mouth 3 (three) times daily. 02/20/21 03/22/21  Terald Sleeper, MD  carvedilol (COREG) 6.25 MG tablet Take 0.5 tablets (3.125 mg total) by mouth 2 (two) times daily with a meal. 02/20/21 03/23/21  Terald Sleeper, MD  diphenoxylate-atropine (LOMOTIL) 2.5-0.025 MG tablet Take 1 tablet by mouth 3 (three) times daily as needed for diarrhea or loose stools. Patient not taking: No sig reported 08/31/20   Lanae Boast, MD  DULoxetine (CYMBALTA) 30 MG capsule Take 30 mg by mouth 2 (two) times daily.    [provider]   famotidine (PEPCID) 20 MG tablet Take 1 tablet (20 mg total) by mouth in the morning and at bedtime. Patient not taking: Reported on 02/20/2021 08/31/20   Lanae Boast, MD  furosemide (LASIX) 40 MG tablet Take 1 tablet (40 mg total) by mouth daily. 02/20/21 03/23/21  Terald Sleeper, MD  gabapentin (NEURONTIN) 300 MG capsule Take 300 mg by mouth 3 (three) times daily.    [provider]  lovastatin (MEVACOR) 40 MG tablet Take 40 mg by mouth at bedtime.    [provider]  magnesium oxide (MAG-OX) 400 MG tablet Take 400 mg by mouth 2 (two) times daily.    [provider]  metoprolol tartrate (LOPRESSOR) 25 MG tablet Take 1 tablet (25 mg total) by mouth 2 (two) times daily. 02/20/21 03/23/21  Terald Sleeper, MD  omeprazole (PRILOSEC) 20 MG capsule Take 1 capsule (20 mg total) by mouth daily as needed (heartburn/reflux). 02/20/21 03/23/21  Terald Sleeper, MD  potassium chloride SA (KLOR-CON) 20 MEQ tablet Take 1 tablet (20 mEq total) by mouth daily. With food 02/20/21 03/23/21  Terald Sleeper, MD  spironolactone (ALDACTONE) 25 MG tablet Take 1 tablet (25 mg total) by mouth daily. 02/20/21 03/23/21  Terald Sleeper, MD  tamsulosin (FLOMAX) 0.4 MG CAPS capsule Take 0.4 mg by mouth at bedtime. Hold Dose For Systolic Pressure<100    [provider]  tiZANidine (ZANAFLEX) 4 MG tablet Take 4 mg  by mouth 3 (three) times daily.    [provider]      Allergies    Fish-derived products, Zoloft [sertraline hcl], Sulfa antibiotics, and Sulfamethizole    Review of Systems   Review of Systems  Constitutional:  Negative for chills and fever.  HENT:  Negative for sore throat.   Respiratory:  Negative for shortness of breath.   Cardiovascular:  Negative for chest pain.  Gastrointestinal:  Positive for abdominal pain. Negative for abdominal distention and vomiting.  Genitourinary:  Negative for dysuria, flank pain, scrotal swelling and testicular pain.  Musculoskeletal:   Negative for back pain.  Skin:  Negative for rash.    Physical Exam Updated Vital Signs BP 136/78 (BP Location: Left Arm)   Pulse 60   Temp 97.9 F (36.6 C) (Oral)   Resp 20   Ht 1.854 m (6\' 1" )   Wt 95.3 kg   SpO2 100%   BMI 27.71 kg/m  Physical Exam Vitals and nursing note reviewed.  Constitutional:      Appearance: Normal appearance. He is well-developed.  HENT:     Head: Atraumatic.     Nose: Nose normal.     Mouth/Throat:     Mouth: Mucous membranes are moist.  Eyes:     General: No scleral icterus.    Conjunctiva/sclera: Conjunctivae normal.  Neck:     Trachea: No tracheal deviation.  Cardiovascular:     Rate and Rhythm: Normal rate and regular rhythm.     Pulses: Normal pulses.     Heart sounds: Normal heart sounds. No murmur heard.    No friction rub. No gallop.  Pulmonary:     Effort: Pulmonary effort is normal. No accessory muscle usage or respiratory distress.     Breath sounds: Normal breath sounds.  Abdominal:     General: Bowel sounds are normal. There is no distension.     Palpations: Abdomen is soft.     Tenderness: There is no abdominal tenderness. There is no guarding or rebound.     Hernia: A hernia is present.     Comments: Soft, reducible left inguinal hernia.   Musculoskeletal:        General: No swelling.     Cervical back: Neck supple.  Skin:    General: Skin is warm and dry.     Findings: No rash.  Neurological:     Mental Status: He is alert.     Comments: Alert, speech clear.   Psychiatric:        Mood and Affect: Mood normal.     ED Results / Procedures / Treatments   Labs (all labs ordered are listed, but only abnormal results are displayed) Results for orders placed or performed during the hospital encounter of 06/03/23  Basic metabolic panel  Result Value Ref Range   Sodium 138 135 - 145 mmol/L   Potassium 3.7 3.5 - 5.1 mmol/L   Chloride 105 98 - 111 mmol/L   CO2 24 22 - 32 mmol/L   Glucose, Bld 116 (H) 70 - 99 mg/dL    BUN 11 8 - 23 mg/dL   Creatinine, Ser 1.61 0.61 - 1.24 mg/dL   Calcium 8.8 (L) 8.9 - 10.3 mg/dL   GFR, Estimated >09 >60 mL/min   Anion gap 9 5 - 15  CBC with Differential  Result Value Ref Range   WBC 6.8 4.0 - 10.5 K/uL   RBC 4.42 4.22 - 5.81 MIL/uL   Hemoglobin 15.1 13.0 - 17.0  g/dL   HCT 32.4 40.1 - 02.7 %   MCV 100.0 80.0 - 100.0 fL   MCH 34.2 (H) 26.0 - 34.0 pg   MCHC 34.2 30.0 - 36.0 g/dL   RDW 25.3 66.4 - 40.3 %   Platelets 149 (L) 150 - 400 K/uL   nRBC 0.0 0.0 - 0.2 %   Neutrophils Relative % 79 %   Neutro Abs 5.4 1.7 - 7.7 K/uL   Lymphocytes Relative 12 %   Lymphs Abs 0.8 0.7 - 4.0 K/uL   Monocytes Relative 8 %   Monocytes Absolute 0.5 0.1 - 1.0 K/uL   Eosinophils Relative 1 %   Eosinophils Absolute 0.0 0.0 - 0.5 K/uL   Basophils Relative 0 %   Basophils Absolute 0.0 0.0 - 0.1 K/uL   Immature Granulocytes 0 %   Abs Immature Granulocytes 0.02 0.00 - 0.07 K/uL      EKG EKG Interpretation Date/Time:  Monday June 03 2023 10:59:55 EDT Ventricular Rate:  64 PR Interval:  204 QRS Duration:  90 QT Interval:  428 QTC Calculation: 441 R Axis:   64  Text Interpretation: Normal sinus rhythm Confirmed by Cathren Laine ([id number]) on 06/03/2023 4:18:36 PM  Radiology CT ABDOMEN PELVIS W CONTRAST  Result Date: 06/03/2023 CLINICAL DATA:  Inguinal hernia. EXAM: CT ABDOMEN AND PELVIS WITH CONTRAST TECHNIQUE: Multidetector CT imaging of the abdomen and pelvis was performed using the standard protocol following bolus administration of intravenous contrast. RADIATION DOSE REDUCTION: This exam was performed according to the departmental dose-optimization program which includes automated exposure control, adjustment of the mA and/or kV according to patient size and/or use of iterative reconstruction technique. CONTRAST:  75mL OMNIPAQUE IOHEXOL 350 MG/ML SOLN COMPARISON:  05/23/2023 FINDINGS: Lower Chest: No acute findings. Stable right basilar pleural-parenchymal scarring and  calcification. Hepatobiliary: No suspicious hepatic masses identified. Prior cholecystectomy noted. Mild diffuse biliary ductal dilatation is seen which appears mildly increased since previous study. Pancreas: No mass or inflammatory changes. No evidence of pancreatic ductal dilatation or peripancreatic fluid collections. Spleen: Within normal limits in size and appearance. Adrenals/Urinary Tract: No suspicious masses identified. No evidence of ureteral calculi or hydronephrosis. Stomach/Bowel: Small hiatal hernia again noted. No evidence of obstruction, inflammatory process or abnormal fluid collections. Mild sigmoid diverticulosis is again seen, without signs of diverticulitis. A moderate left inguinal hernia is increased in size since previous study, and now contains a loop of sigmoid colon. No evidence of bowel obstruction or strangulation. Small right inguinal hernia contains only fat and remains stable. Vascular/Lymphatic: No pathologically enlarged lymph nodes. No acute vascular findings. Reproductive: Stable mildly enlarged prostate gland with prior TURP defect. Other:  None. Musculoskeletal:  No suspicious bone lesions identified. IMPRESSION: Increased size of moderate left inguinal hernia, which now contains a loop of sigmoid colon. No evidence of bowel obstruction or strangulation. Stable small right inguinal hernia, which contains only fat. Stable small hiatal hernia. Mild sigmoid diverticulosis, without radiographic evidence of diverticulitis. Prior cholecystectomy. Mild diffuse biliary ductal dilatation appears mildly increased since previous study, but no etiology is apparent by CT. Recommend correlation with liver function tests, and consider abdomen MRI and MRCP without and with contrast for further evaluation if clinically warranted. 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 Electronically Signed   By: Danae Orleans M.D.   On: 06/03/2023 16:35    Procedures Procedures     Medications Ordered in ED Medications  oxyCODONE-acetaminophen (PERCOCET/ROXICET) 5-325 MG per tablet 1 tablet (has no administration in time range)  morphine (PF) 4 MG/ML injection 4 mg (4 mg Intravenous Given 06/03/23 1114)  iohexol (OMNIPAQUE) 350 MG/ML injection 75 mL (75 mLs Intravenous Contrast Given 06/03/23 1354)    ED Course/ Medical Decision Making/ A&P Clinical Course as of 06/03/23 1734  Mon Jun 03, 2023  1506 RDW: 12.3 [KM]    Clinical Course User Index [KM] Lyman Speller, MD                                 Medical Decision Making Problems Addressed: Left inguinal hernia: acute illness or injury with systemic symptoms that poses a threat to life or bodily functions    Details: Acute on chronic  Amount and/or Complexity of Data Reviewed Independent Historian: EMS    Details: hx External Data Reviewed: radiology and notes. Labs: ordered. Decision-making details documented in ED Course. Radiology: ordered and independent interpretation performed. Decision-making details documented in ED Course. ECG/medicine tests: ordered and independent interpretation performed. Decision-making details documented in ED Course.  Risk Prescription drug management. Parenteral controlled substances. Decision regarding hospitalization.   Iv ns. Continuous pulse ox and cardiac monitoring. Labs ordered/sent. Imaging ordered.   Differential diagnosis includes incarcerated hernia, hernia, etc. Dispo decision including potential need for admission considered - will get labs and imaging and reassess.   Reviewed nursing notes and prior charts for additional history. External reports reviewed. Additional history from: EMS.  Labs and ct ordered from triage. Give morphine iv.   Cardiac monitor: sinus rhythm, rate 76.  Labs reviewed/interpreted by me - wbc and hgb normal. Chem unremarkable.   CT reviewed/interpreted by me - left inguinal hernia, no obstruction  Hernia is reducible.  Abd soft non tender.   Rec close pcp f/u.  Return precautions provided.          Final Clinical Impression(s) / ED Diagnoses Final diagnoses:  None    Rx / DC Orders ED Discharge Orders     None         Cathren Laine, MD 06/03/23 1744

## 2023-06-03 NOTE — ED Notes (Signed)
Pt not responding for vital recheck.
